# Patient Record
Sex: Female | Born: 1935 | Race: Black or African American | Hispanic: No | Smoking: Never smoker
Health system: Southern US, Community
[De-identification: ages and names within clinical notes are randomized; demographics above are authoritative.]

## PROBLEM LIST (undated history)

## (undated) DIAGNOSIS — I2510 Atherosclerotic heart disease of native coronary artery without angina pectoris: Secondary | ICD-10-CM

## (undated) DIAGNOSIS — E1140 Type 2 diabetes mellitus with diabetic neuropathy, unspecified: Secondary | ICD-10-CM

## (undated) DIAGNOSIS — J150 Pneumonia due to Klebsiella pneumoniae: Secondary | ICD-10-CM

## (undated) DIAGNOSIS — M5136 Other intervertebral disc degeneration, lumbar region: Secondary | ICD-10-CM

## (undated) DIAGNOSIS — N183 Chronic kidney disease, stage 3 unspecified: Secondary | ICD-10-CM

## (undated) DIAGNOSIS — E7800 Pure hypercholesterolemia, unspecified: Secondary | ICD-10-CM

## (undated) DIAGNOSIS — I509 Heart failure, unspecified: Secondary | ICD-10-CM

## (undated) DIAGNOSIS — I200 Unstable angina: Secondary | ICD-10-CM

## (undated) DIAGNOSIS — I361 Nonrheumatic tricuspid (valve) insufficiency: Secondary | ICD-10-CM

## (undated) DIAGNOSIS — I4891 Unspecified atrial fibrillation: Secondary | ICD-10-CM

## (undated) DIAGNOSIS — I10 Essential (primary) hypertension: Secondary | ICD-10-CM

## (undated) DIAGNOSIS — E119 Type 2 diabetes mellitus without complications: Secondary | ICD-10-CM

## (undated) DIAGNOSIS — Z9981 Dependence on supplemental oxygen: Secondary | ICD-10-CM

## (undated) DIAGNOSIS — E782 Mixed hyperlipidemia: Secondary | ICD-10-CM

## (undated) DIAGNOSIS — D649 Anemia, unspecified: Secondary | ICD-10-CM

## (undated) HISTORY — DX: Chronic kidney disease, stage 3 (moderate): N18.3

## (undated) HISTORY — DX: Atherosclerotic heart disease of native coronary artery without angina pectoris: I25.10

## (undated) HISTORY — DX: Chronic kidney disease, stage 3 unspecified: N18.30

## (undated) HISTORY — DX: Essential (primary) hypertension: I10

## (undated) HISTORY — DX: Mixed hyperlipidemia: E78.2

## (undated) HISTORY — DX: Unspecified atrial fibrillation: I48.91

## (undated) HISTORY — DX: Type 2 diabetes mellitus without complications: E11.9

---

## 2001-05-13 ENCOUNTER — Emergency Department (HOSPITAL_COMMUNITY): Admission: EM | Admit: 2001-05-13 | Discharge: 2001-05-13 | Payer: Self-pay | Admitting: *Deleted

## 2002-09-16 ENCOUNTER — Ambulatory Visit (HOSPITAL_COMMUNITY): Admission: RE | Admit: 2002-09-16 | Discharge: 2002-09-16 | Payer: Self-pay | Admitting: Pulmonary Disease

## 2002-09-20 ENCOUNTER — Ambulatory Visit (HOSPITAL_COMMUNITY): Admission: RE | Admit: 2002-09-20 | Discharge: 2002-09-20 | Payer: Self-pay | Admitting: Pulmonary Disease

## 2002-12-02 ENCOUNTER — Ambulatory Visit (HOSPITAL_COMMUNITY): Admission: RE | Admit: 2002-12-02 | Discharge: 2002-12-02 | Payer: Self-pay | Admitting: Orthopedic Surgery

## 2002-12-02 ENCOUNTER — Encounter: Payer: Self-pay | Admitting: Orthopedic Surgery

## 2002-12-19 ENCOUNTER — Encounter: Admission: RE | Admit: 2002-12-19 | Discharge: 2002-12-19 | Payer: Self-pay | Admitting: Orthopedic Surgery

## 2002-12-31 ENCOUNTER — Encounter: Payer: Self-pay | Admitting: Orthopedic Surgery

## 2003-01-14 ENCOUNTER — Inpatient Hospital Stay (HOSPITAL_COMMUNITY): Admission: RE | Admit: 2003-01-14 | Discharge: 2003-01-17 | Payer: Self-pay | Admitting: Orthopedic Surgery

## 2003-01-14 ENCOUNTER — Encounter: Payer: Self-pay | Admitting: Orthopedic Surgery

## 2003-01-14 HISTORY — PX: TOTAL HIP ARTHROPLASTY: SHX124

## 2003-05-27 ENCOUNTER — Emergency Department (HOSPITAL_COMMUNITY): Admission: EM | Admit: 2003-05-27 | Discharge: 2003-05-27 | Payer: Self-pay | Admitting: Emergency Medicine

## 2004-01-19 ENCOUNTER — Ambulatory Visit: Payer: Self-pay | Admitting: Orthopedic Surgery

## 2004-12-16 ENCOUNTER — Ambulatory Visit: Payer: Self-pay | Admitting: Orthopedic Surgery

## 2005-12-20 ENCOUNTER — Ambulatory Visit: Payer: Self-pay | Admitting: Orthopedic Surgery

## 2007-01-01 ENCOUNTER — Ambulatory Visit: Payer: Self-pay | Admitting: Orthopedic Surgery

## 2007-01-01 DIAGNOSIS — M8700 Idiopathic aseptic necrosis of unspecified bone: Secondary | ICD-10-CM | POA: Insufficient documentation

## 2007-02-05 ENCOUNTER — Ambulatory Visit (HOSPITAL_COMMUNITY): Admission: RE | Admit: 2007-02-05 | Discharge: 2007-02-05 | Payer: Self-pay | Admitting: Pulmonary Disease

## 2008-09-08 ENCOUNTER — Ambulatory Visit: Payer: Self-pay | Admitting: Cardiology

## 2008-09-08 ENCOUNTER — Ambulatory Visit (HOSPITAL_COMMUNITY): Admission: RE | Admit: 2008-09-08 | Discharge: 2008-09-08 | Payer: Self-pay | Admitting: Pulmonary Disease

## 2008-09-08 ENCOUNTER — Encounter (INDEPENDENT_AMBULATORY_CARE_PROVIDER_SITE_OTHER): Payer: Self-pay | Admitting: Pulmonary Disease

## 2008-09-09 ENCOUNTER — Ambulatory Visit: Payer: Self-pay | Admitting: Cardiology

## 2008-09-09 ENCOUNTER — Encounter: Payer: Self-pay | Admitting: Cardiology

## 2008-09-12 ENCOUNTER — Encounter: Payer: Self-pay | Admitting: Cardiology

## 2008-09-12 ENCOUNTER — Ambulatory Visit (HOSPITAL_COMMUNITY): Admission: RE | Admit: 2008-09-12 | Discharge: 2008-09-12 | Payer: Self-pay | Admitting: Cardiology

## 2008-09-12 LAB — CONVERTED CEMR LAB
BUN: 19 mg/dL (ref 6–23)
Basophils Relative: 0 % (ref 0–1)
CO2: 29 meq/L (ref 19–32)
Chloride: 102 meq/L (ref 96–112)
Eosinophils Absolute: 0.3 10*3/uL (ref 0.0–0.7)
Eosinophils Relative: 3 % (ref 0–5)
Glucose, Bld: 189 mg/dL — ABNORMAL HIGH (ref 70–99)
HCT: 41 % (ref 36.0–46.0)
Lymphs Abs: 2.1 10*3/uL (ref 0.7–4.0)
MCHC: 34 g/dL (ref 30.0–36.0)
MCV: 95 fL (ref 78.0–100.0)
Monocytes Relative: 8 % (ref 3–12)
Neutrophils Relative %: 57 % (ref 43–77)
Potassium: 4.1 meq/L (ref 3.5–5.3)
RBC: 4.31 M/uL (ref 3.87–5.11)
Sodium: 139 meq/L (ref 135–145)
WBC: 6.8 10*3/uL (ref 4.0–10.5)

## 2008-09-18 ENCOUNTER — Encounter (HOSPITAL_COMMUNITY): Admission: RE | Admit: 2008-09-18 | Discharge: 2008-10-18 | Payer: Self-pay | Admitting: Cardiology

## 2008-09-18 ENCOUNTER — Ambulatory Visit: Payer: Self-pay | Admitting: Cardiology

## 2008-09-23 ENCOUNTER — Encounter: Payer: Self-pay | Admitting: Cardiology

## 2008-09-24 ENCOUNTER — Ambulatory Visit: Payer: Self-pay | Admitting: Cardiology

## 2008-09-24 DIAGNOSIS — I10 Essential (primary) hypertension: Secondary | ICD-10-CM | POA: Insufficient documentation

## 2008-09-24 DIAGNOSIS — R0609 Other forms of dyspnea: Secondary | ICD-10-CM | POA: Insufficient documentation

## 2008-09-24 DIAGNOSIS — I4819 Other persistent atrial fibrillation: Secondary | ICD-10-CM | POA: Insufficient documentation

## 2008-09-24 DIAGNOSIS — R0989 Other specified symptoms and signs involving the circulatory and respiratory systems: Secondary | ICD-10-CM

## 2008-09-29 ENCOUNTER — Ambulatory Visit (HOSPITAL_COMMUNITY): Admission: RE | Admit: 2008-09-29 | Discharge: 2008-09-29 | Payer: Self-pay | Admitting: Cardiology

## 2008-09-29 ENCOUNTER — Ambulatory Visit: Payer: Self-pay | Admitting: Cardiology

## 2008-09-29 ENCOUNTER — Encounter: Payer: Self-pay | Admitting: Cardiology

## 2008-10-02 ENCOUNTER — Ambulatory Visit: Payer: Self-pay | Admitting: Cardiology

## 2008-10-06 ENCOUNTER — Ambulatory Visit: Payer: Self-pay | Admitting: Cardiology

## 2008-10-06 LAB — CONVERTED CEMR LAB: POC INR: 2.1

## 2008-10-15 ENCOUNTER — Ambulatory Visit: Payer: Self-pay

## 2008-10-15 LAB — CONVERTED CEMR LAB: POC INR: 2.1

## 2008-10-23 ENCOUNTER — Ambulatory Visit: Payer: Self-pay | Admitting: Cardiology

## 2008-10-23 LAB — CONVERTED CEMR LAB: POC INR: 1.8

## 2008-10-28 ENCOUNTER — Encounter: Payer: Self-pay | Admitting: Cardiology

## 2008-11-03 ENCOUNTER — Ambulatory Visit: Payer: Self-pay | Admitting: Cardiology

## 2008-11-03 LAB — CONVERTED CEMR LAB: POC INR: 1.7

## 2008-11-10 ENCOUNTER — Ambulatory Visit: Payer: Self-pay | Admitting: Cardiology

## 2008-11-10 LAB — CONVERTED CEMR LAB: POC INR: 2.1

## 2008-11-24 ENCOUNTER — Ambulatory Visit: Payer: Self-pay | Admitting: Cardiology

## 2008-11-24 LAB — CONVERTED CEMR LAB: POC INR: 1.8

## 2008-12-10 ENCOUNTER — Ambulatory Visit: Payer: Self-pay | Admitting: Orthopedic Surgery

## 2008-12-10 DIAGNOSIS — Z96649 Presence of unspecified artificial hip joint: Secondary | ICD-10-CM

## 2008-12-15 ENCOUNTER — Ambulatory Visit: Payer: Self-pay | Admitting: Cardiology

## 2008-12-18 ENCOUNTER — Ambulatory Visit: Payer: Self-pay | Admitting: Cardiology

## 2008-12-22 ENCOUNTER — Ambulatory Visit: Payer: Self-pay | Admitting: Cardiology

## 2008-12-22 LAB — CONVERTED CEMR LAB: POC INR: 2.7

## 2008-12-31 ENCOUNTER — Ambulatory Visit: Payer: Self-pay | Admitting: Cardiology

## 2009-01-14 ENCOUNTER — Ambulatory Visit: Payer: Self-pay | Admitting: Cardiovascular Disease

## 2009-01-14 ENCOUNTER — Encounter (INDEPENDENT_AMBULATORY_CARE_PROVIDER_SITE_OTHER): Payer: Self-pay | Admitting: *Deleted

## 2009-01-14 LAB — CONVERTED CEMR LAB
ALT: 10 units/L
Alkaline Phosphatase: 109 units/L
Bilirubin, Direct: 0.2 mg/dL
CO2: 20 meq/L
Cholesterol: 177 mg/dL
Creatinine, Ser: 1.11 mg/dL
HDL: 60 mg/dL
Hgb A1c MFr Bld: 6.7 %
LDL Cholesterol: 105 mg/dL
Triglycerides: 60 mg/dL

## 2009-01-28 ENCOUNTER — Encounter (INDEPENDENT_AMBULATORY_CARE_PROVIDER_SITE_OTHER): Payer: Self-pay | Admitting: *Deleted

## 2009-01-28 ENCOUNTER — Ambulatory Visit: Payer: Self-pay | Admitting: Cardiology

## 2009-01-28 LAB — CONVERTED CEMR LAB: POC INR: 1.7

## 2009-02-10 ENCOUNTER — Ambulatory Visit: Payer: Self-pay | Admitting: Cardiology

## 2009-02-10 ENCOUNTER — Encounter: Payer: Self-pay | Admitting: *Deleted

## 2009-02-11 ENCOUNTER — Ambulatory Visit: Payer: Self-pay | Admitting: Cardiology

## 2009-02-19 ENCOUNTER — Ambulatory Visit: Payer: Self-pay | Admitting: Cardiovascular Disease

## 2009-03-02 ENCOUNTER — Ambulatory Visit: Payer: Self-pay | Admitting: Cardiology

## 2009-03-02 LAB — CONVERTED CEMR LAB: POC INR: 5.6

## 2009-03-05 ENCOUNTER — Ambulatory Visit: Payer: Self-pay | Admitting: Cardiovascular Disease

## 2009-03-05 LAB — CONVERTED CEMR LAB: POC INR: 2.9

## 2009-03-11 ENCOUNTER — Ambulatory Visit: Payer: Self-pay | Admitting: Cardiology

## 2009-03-11 LAB — CONVERTED CEMR LAB: POC INR: 3.8

## 2009-03-25 ENCOUNTER — Ambulatory Visit: Payer: Self-pay | Admitting: Cardiovascular Disease

## 2009-04-01 ENCOUNTER — Ambulatory Visit: Payer: Self-pay | Admitting: Cardiology

## 2009-04-01 LAB — CONVERTED CEMR LAB: POC INR: 2.6

## 2009-04-15 ENCOUNTER — Ambulatory Visit: Payer: Self-pay | Admitting: Cardiology

## 2009-04-22 ENCOUNTER — Ambulatory Visit: Payer: Self-pay | Admitting: Cardiology

## 2009-05-06 ENCOUNTER — Ambulatory Visit: Payer: Self-pay | Admitting: Cardiology

## 2009-05-27 ENCOUNTER — Ambulatory Visit: Payer: Self-pay | Admitting: Cardiology

## 2009-06-03 ENCOUNTER — Ambulatory Visit: Payer: Self-pay | Admitting: Cardiology

## 2009-06-03 LAB — CONVERTED CEMR LAB: POC INR: 2.9

## 2009-06-17 ENCOUNTER — Ambulatory Visit: Payer: Self-pay | Admitting: Cardiology

## 2009-06-17 LAB — CONVERTED CEMR LAB: POC INR: 4

## 2009-06-25 ENCOUNTER — Ambulatory Visit: Payer: Self-pay | Admitting: Cardiology

## 2009-07-08 ENCOUNTER — Ambulatory Visit: Payer: Self-pay | Admitting: Cardiology

## 2009-07-08 LAB — CONVERTED CEMR LAB: POC INR: 3.4

## 2009-07-23 ENCOUNTER — Ambulatory Visit: Payer: Self-pay | Admitting: Cardiology

## 2009-07-23 LAB — CONVERTED CEMR LAB: POC INR: 3.1

## 2009-08-13 ENCOUNTER — Ambulatory Visit: Payer: Self-pay | Admitting: Cardiology

## 2009-08-27 ENCOUNTER — Ambulatory Visit: Payer: Self-pay | Admitting: Cardiology

## 2009-09-17 ENCOUNTER — Ambulatory Visit: Payer: Self-pay | Admitting: Cardiology

## 2009-09-17 LAB — CONVERTED CEMR LAB: POC INR: 3.1

## 2009-09-30 ENCOUNTER — Ambulatory Visit: Payer: Self-pay | Admitting: Cardiology

## 2009-09-30 DIAGNOSIS — E782 Mixed hyperlipidemia: Secondary | ICD-10-CM | POA: Insufficient documentation

## 2009-10-07 ENCOUNTER — Ambulatory Visit: Payer: Self-pay | Admitting: Cardiology

## 2009-10-28 ENCOUNTER — Ambulatory Visit: Payer: Self-pay | Admitting: Cardiology

## 2009-11-18 ENCOUNTER — Ambulatory Visit: Payer: Self-pay | Admitting: Cardiology

## 2009-12-09 ENCOUNTER — Ambulatory Visit: Payer: Self-pay | Admitting: Cardiology

## 2009-12-09 LAB — CONVERTED CEMR LAB: POC INR: 2.1

## 2010-01-06 ENCOUNTER — Ambulatory Visit: Payer: Self-pay | Admitting: Cardiology

## 2010-01-06 LAB — CONVERTED CEMR LAB: POC INR: 2.1

## 2010-02-03 ENCOUNTER — Ambulatory Visit: Payer: Self-pay | Admitting: Cardiology

## 2010-03-03 ENCOUNTER — Ambulatory Visit: Admission: RE | Admit: 2010-03-03 | Discharge: 2010-03-03 | Payer: Self-pay | Source: Home / Self Care

## 2010-03-03 LAB — CONVERTED CEMR LAB: POC INR: 2.6

## 2010-03-09 NOTE — Medication Information (Signed)
Summary: ccr-lr  Anticoagulant Therapy  Managed by: Vashti Hey, RN PCP: Dr. Kari Baars Supervising MD: Dietrich Pates MD, Molly Maduro Indication 1: Atrial Fibrillation Lab Used: [facility] [place] Site: [place] INR POC 3.7 INR RANGE 2-3  Dietary changes: no    Health status changes: no    Bleeding/hemorrhagic complications: no    Recent/future hospitalizations: no    Any changes in medication regimen? no    Recent/future dental: no  Any missed doses?: no       Is patient compliant with meds? yes       Allergies: No Known Drug Allergies  Anticoagulation Management History:      The patient is taking warfarin and comes in today for a routine follow up visit.  Positive risk factors for bleeding include an age of 75 years or older.  The bleeding index is 'intermediate risk'.  Positive CHADS2 values include History of HTN and Age > 50 years old.  Her last INR was 0.9.  Anticoagulation responsible provider: Dietrich Pates MD, Molly Maduro.  INR POC: 3.7.  Cuvette Lot#: [id number].  Exp: 10/11.    Anticoagulation Management Assessment/Plan:      The patient's current anticoagulation dose is Warfarin sodium 5 mg tabs: Take as directed by [facility].  The target INR is 2.0-3.0.  The next INR is due 10/28/2009.  Anticoagulation instructions were given to patient.  Results were reviewed/authorized by Vashti Hey, RN.  She was notified by Vashti Hey RN.         Prior Anticoagulation Instructions: INR 3.1 Take coumadin 1 tablet tonight then resume 1 tablet once daily except 2 tablets on Sundays, Tuesdays and Thursdays  Current Anticoagulation Instructions: INR 3.7 Hold coumadin tonight then decrease dose to 5mg  once daily except 10mg  on Sundays and Thursdays

## 2010-03-09 NOTE — Medication Information (Signed)
Summary: ccr-lr  Anticoagulant Therapy  Managed by: Melissa Hey, Melissa [name] PCP: Dr. Kari Baars Supervising MD: Diona Browner MD, Remi Deter Indication 1: Atrial Fibrillation Lab Used: [facility] [place] Site: [place] INR POC 4.9 INR RANGE 2-3  Dietary changes: no    Health status changes: no    Bleeding/hemorrhagic complications: no    Recent/future hospitalizations: no    Any changes in medication regimen? no    Recent/future dental: no  Any missed doses?: no       Is patient compliant with meds? yes       Allergies: No Known Drug Allergies  Anticoagulation Management History:      The patient is taking warfarin and comes in today for a routine follow up visit.  Positive risk factors for bleeding include an age of 75 years or older.  The bleeding index is 'intermediate risk'.  Positive CHADS2 values include History of HTN and Age > 54 years old.  Her last INR was 0.9.  Anticoagulation responsible provider: Diona Browner MD, Remi Deter.  INR POC: 4.9.  Cuvette Lot#: [id number].  Exp: 10/11.    Anticoagulation Management Assessment/Plan:      The patient's current anticoagulation dose is Warfarin sodium 5 mg tabs: Take as directed by [facility].  The target INR is 2.0-3.0.  The next INR is due 08/27/2009.  Anticoagulation instructions were given to patient.  Results were reviewed/authorized by Melissa Hey, Melissa [name].         Prior Anticoagulation Instructions: INR 3.1 Take coumdin 1 tablet tonight then resume 2 tablets qd except 1 tablets on Mondays, Wednesdays and Fridays  Current Anticoagulation Instructions: INR 4.9 Hold coumadin tonight and tomorrow night then decrease dose to 5mg  once daily except 10 on Sundays, Tuesdays and Thursdays

## 2010-03-09 NOTE — Medication Information (Signed)
Summary: ccr  Anticoagulant Therapy  Managed by: Vashti Hey, RN PCP: Dr. Kari Baars Supervising MD: Diona Browner MD, Remi Deter Indication 1: Atrial Fibrillation Lab Used: [facility] [place] Site: [place] INR POC 4.0 INR RANGE 2-3  Dietary changes: no    Health status changes: no    Bleeding/hemorrhagic complications: no    Recent/future hospitalizations: no    Any changes in medication regimen? no    Recent/future dental: no  Any missed doses?: no       Is patient compliant with meds? yes       Allergies: No Known Drug Allergies  Anticoagulation Management History:      The patient is taking warfarin and comes in today for a routine follow up visit.  Positive risk factors for bleeding include an age of 75 years or older.  The bleeding index is 'intermediate risk'.  Positive CHADS2 values include History of HTN and Age > 69 years old.  Her last INR was 0.9.  Anticoagulation responsible provider: Diona Browner MD, Remi Deter.  INR POC: 4.0.  Cuvette Lot#: [id number].  Exp: 10/11.    Anticoagulation Management Assessment/Plan:      The patient's current anticoagulation dose is Warfarin sodium 5 mg tabs: Take as directed by [facility].  The target INR is 2.0-3.0.  The next INR is due 06/25/2009.  Anticoagulation instructions were given to patient.  Results were reviewed/authorized by Vashti Hey, RN.  She was notified by Vashti Hey RN.         Prior Anticoagulation Instructions: INR 2.9 Continue coumadin 2 tablets once daily  Pt will be out of town from 5/4 - 5/9  Current Anticoagulation Instructions: INR 4.0 Hold coumadin tonight then decrease dose to 10mg  once daily except 5mg  on Mondays and Thursdays

## 2010-03-09 NOTE — Medication Information (Signed)
Summary: ccr-lr  Anticoagulant Therapy  Managed by: Vashti Hey, RN PCP: Dr. Kari Baars Supervising MD: Dietrich Pates MD, Molly Maduro Indication 1: Atrial Fibrillation Lab Used: [facility] [place] Site: [place] INR POC 3.8 INR RANGE 2-3  Dietary changes: no    Health status changes: no    Bleeding/hemorrhagic complications: no    Recent/future hospitalizations: no    Any changes in medication regimen? no    Recent/future dental: no  Any missed doses?: no       Is patient compliant with meds? yes       Allergies: No Known Drug Allergies  Anticoagulation Management History:      The patient is taking warfarin and comes in today for a routine follow up visit.  Positive risk factors for bleeding include an age of 75 years or older.  The bleeding index is 'intermediate risk'.  Positive CHADS2 values include History of HTN and Age > 75 years old.  Her last INR was 0.9.  Anticoagulation responsible provider: Dietrich Pates MD, Molly Maduro.  INR POC: 3.8.  Cuvette Lot#: [id number].  Exp: 10/11.    Anticoagulation Management Assessment/Plan:      The patient's current anticoagulation dose is Warfarin sodium 5 mg tabs: Take as directed by [facility].  The target INR is 2.0-3.0.  The next INR is due 03/25/2009.  Anticoagulation instructions were given to patient.  Results were reviewed/authorized by Vashti Hey, RN.  She was notified by Vashti Hey RN.         Prior Anticoagulation Instructions: INR 2.9 Will finish Keflex on Monday 03/09/09 Take coumadin 1 1/2 tablets x 3 nights then resume 2 tablets once daily except 1 1/2 tablets on Mondays, Wednesdays and Fridays  Current Anticoagulation Instructions: INR 3.8 Take coumadin 1/2 tablet tonight then decrease dose to 1 1/2 tablets once daily except 2 tablets on Sundays, Tuesdays and Thursdays

## 2010-03-09 NOTE — Medication Information (Signed)
Summary: ccr-lr  Anticoagulant Therapy  Managed by: Vashti Hey, RN PCP: Dr. Kari Baars Supervising MD: Dietrich Pates MD, Molly Maduro Indication 1: Atrial Fibrillation Lab [facility]  Site:  INR POC 2.9 INR RANGE 2-3  Dietary changes: no    Health status changes: no    Bleeding/hemorrhagic complications: no    Recent/future hospitalizations: no    Any changes in medication regimen? no    Recent/future dental: no  Any missed doses?: no       Is patient compliant with meds? yes       Allergies: No Known Drug Allergies  Anticoagulation Management History:      The patient is taking warfarin and comes in today for a routine follow up visit.  Positive risk factors for bleeding include an age of 75 years or older.  The bleeding index is 'intermediate risk'.  Positive CHADS2 values include History of HTN and Age > 75 years old.  Her last INR was 0.9.  Anticoagulation responsible provider: Dietrich Pates MD, Molly Maduro.  INR POC: 2.9.  Cuvette Lot#: [id number].  Exp: 10/11.    Anticoagulation Management Assessment/Plan:      The patient's current anticoagulation dose is Warfarin sodium 5 mg tabs: Take as directed by [facility].  The target INR is 2.0-3.0.  The next INR is due 06/17/2009.  Anticoagulation instructions were given to patient.  Results were reviewed/authorized by Vashti Hey, RN.  She was notified by Vashti Hey RN.         Prior Anticoagulation Instructions: INR 1.1 Take coumadin 2 tablets once daily then recheck 06/03/09   Current Anticoagulation Instructions: INR 2.9 Continue coumadin 2 tablets once daily  Pt will be out of town from 5/4 - 5/9

## 2010-03-09 NOTE — Medication Information (Signed)
Summary: ccr-lr  Anticoagulant Therapy  Managed by: Melissa Hey, RN PCP: Dr. Kari Baars Supervising MD: Dietrich Pates MD, Molly Maduro Indication 1: Atrial Fibrillation Lab Used: [facility] [place] Site: [place] INR POC 2.1 INR RANGE 2-3  Dietary changes: no    Health status changes: no    Bleeding/hemorrhagic complications: no    Recent/future hospitalizations: no    Any changes in medication regimen? no    Recent/future dental: no  Any missed doses?: no       Is patient compliant with meds? yes       Allergies: No Known Drug Allergies  Anticoagulation Management History:      The patient is taking warfarin and comes in today for a routine follow up visit.  Positive risk factors for bleeding include an age of 75 years or older.  The bleeding index is 'intermediate risk'.  Positive CHADS2 values include History of HTN and Age > 42 years old.  Her last INR was 0.9.  Anticoagulation responsible provider: Dietrich Pates MD, Molly Maduro.  INR POC: 2.1.  Cuvette Lot#: [id number].  Exp: 10/11.    Anticoagulation Management Assessment/Plan:      The patient's current anticoagulation dose is Warfarin sodium 5 mg tabs: Take as directed by [facility].  The target INR is 2.0-3.0.  The next INR is due 12/09/2009.  Anticoagulation instructions were given to patient.  Results were reviewed/authorized by Melissa Hey, RN.  She was notified by Melissa Hey RN.         Prior Anticoagulation Instructions: INR 2.3 Continue coumadin 5mg  once daily except 10mg  on Sundays and Thursdays  Current Anticoagulation Instructions: INR 2.1 Continue coumadin 5mg  once daily except 10mg  on Sundays and Thursdays

## 2010-03-09 NOTE — Medication Information (Signed)
Summary: ccr-lr  Anticoagulant Therapy  Managed by: Vashti Hey, RN PCP: Dr. Kari Baars Supervising MD: Dietrich Pates MD, Molly Maduro Indication 1: Atrial Fibrillation Lab Used: [facility] [place] Site: [place] INR POC 3.5 INR RANGE 2-3  Dietary changes: no    Health status changes: no    Bleeding/hemorrhagic complications: no    Recent/future hospitalizations: no    Any changes in medication regimen? no    Recent/future dental: no  Any missed doses?: no       Is patient compliant with meds? yes       Allergies: No Known Drug Allergies  Anticoagulation Management History:      The patient is taking warfarin and comes in today for a routine follow up visit.  Positive risk factors for bleeding include an age of 75 years or older.  The bleeding index is 'intermediate risk'.  Positive CHADS2 values include History of HTN and Age > 75 years old.  Her last INR was 0.9.  Anticoagulation responsible provider: Dietrich Pates MD, Molly Maduro.  INR POC: 3.5.  Cuvette Lot#: [id number].  Exp: 10/11.    Anticoagulation Management Assessment/Plan:      The patient's current anticoagulation dose is Warfarin sodium 5 mg tabs: Take as directed by [facility].  The target INR is 2.0-3.0.  The next INR is due 07/09/2009.  Anticoagulation instructions were given to patient.  Results were reviewed/authorized by Vashti Hey, RN.  She was notified by Vashti Hey RN.         Prior Anticoagulation Instructions: INR 4.0 Hold coumadin tonight then decrease dose to 10mg  once daily except 5mg  on Mondays and Thursdays  Current Anticoagulation Instructions: INR 3.5 Hold coumadin tonight then resume 10mg  once daily except 5mg  on Mondays and Thursdays

## 2010-03-09 NOTE — Medication Information (Signed)
Summary: ccr-lr  Anticoagulant Therapy  Managed by: Vashti Hey, RN PCP: Dr. Kari Baars Supervising MD: Daleen Squibb MD, Darene Lamer Used: [facility] [place] Site: [place] INR POC 1.6 INR RANGE 2-3  Dietary changes: no    Health status changes: no    Bleeding/hemorrhagic complications: no    Recent/future hospitalizations: no    Any changes in medication regimen? no    Recent/future dental: no  Any missed doses?: no       Is patient compliant with meds? yes       Allergies: No Known Drug Allergies  Anticoagulation Management History:      The patient is taking warfarin and comes in today for a routine follow up visit.  Positive risk factors for bleeding include an age of 75 years or older.  The bleeding index is 'intermediate risk'.  Positive CHADS2 values include History of HTN and Age > 70 years old.  Her last INR was 0.9.  Anticoagulation responsible provider: Daleen Squibb MD, Maisie Fus.  INR POC: 1.6.  Cuvette Lot#: [id number].  Exp: 10/11.    Anticoagulation Management Assessment/Plan:      The patient's current anticoagulation dose is Warfarin sodium 5 mg tabs: Take as directed by [facility].  The target INR is 2.0-3.0.  The next INR is due 02/19/2009.  Anticoagulation instructions were given to patient.  Results were reviewed/authorized by Vashti Hey, RN.  She was notified by Vashti Hey RN.         Prior Anticoagulation Instructions: INR 1.7  Increase coumadin to 10mg  once daily except 7.5mg  on Mondays and Fridays  Current Anticoagulation Instructions: INR 1.6 Take coumadin 3mg  x 2 days then increase dose to 10mg  once daily

## 2010-03-09 NOTE — Medication Information (Signed)
Summary: CCR  Anticoagulant Therapy  Managed by: Melissa Hey, RN PCP: Melissa [name] Supervising MD: Melissa Squibb MD, Melissa [name] Indication 1: Atrial Fibrillation Lab Used: [facility] [place] Site: [place] INR POC 3.1 INR RANGE 2-3  Dietary changes: no    Health status changes: no    Bleeding/hemorrhagic complications: no    Recent/future hospitalizations: no    Any changes in medication regimen? no    Recent/future dental: no  Any missed doses?: no       Is patient compliant with meds? yes       Allergies: No Known Drug Allergies  Anticoagulation Management History:      The patient is taking warfarin and comes in today for a routine follow up visit.  Positive risk factors for bleeding include an age of 75 years or older.  The bleeding index is 'intermediate risk'.  Positive CHADS2 values include History of HTN and Age > 75 years old.  Her last INR was 0.9.  Anticoagulation responsible provider: Daleen Squibb MD, Melissa [name].  INR POC: 3.1.  Cuvette Lot#: [id number].  Exp: 10/11.    Anticoagulation Management Assessment/Plan:      The patient's current anticoagulation dose is Warfarin sodium 5 mg tabs: Take as directed by [facility].  The target INR is 2.0-3.0.  The next INR is due 05/06/2009.  Anticoagulation instructions were given to patient.  Results were reviewed/authorized by Melissa Hey, RN.  She was notified by Melissa Hey RN.         Prior Anticoagulation Instructions: INR 1.6 Take coumadin 3 tablets tonight and tomorrow night then resume 2 tablets once daily except 1 1/2 tablets on Sundays, Tuesdays and Thursdays  Current Anticoagulation Instructions: INR 3.1 Continue coumadin 10mg  once daily except 7.5mg  on Sundays, Tuesdays and Thursdays

## 2010-03-09 NOTE — Medication Information (Signed)
Summary: ccr-lr  Anticoagulant Therapy  Managed by: Vashti Hey, RN PCP: Dr. Kari Baars Supervising MD: Eden Emms MD, Theron Arista Indication 1: Atrial Fibrillation Lab Used: [facility] [place] Site: [place] INR POC 1.5 INR RANGE 2-3  Dietary changes: no    Health status changes: no    Bleeding/hemorrhagic complications: no    Recent/future hospitalizations: no    Any changes in medication regimen? no    Recent/future dental: no  Any missed doses?: no       Is patient compliant with meds? yes       Allergies: No Known Drug Allergies  Anticoagulation Management History:      The patient is taking warfarin and comes in today for a routine follow up visit.  Positive risk factors for bleeding include an age of 75 years or older.  The bleeding index is 'intermediate risk'.  Positive CHADS2 values include History of HTN and Age > 59 years old.  Her last INR was 0.9.  Anticoagulation responsible provider: Eden Emms MD, Theron Arista.  INR POC: 1.5.  Cuvette Lot#: [id number].  Exp: 10/11.    Anticoagulation Management Assessment/Plan:      The patient's current anticoagulation dose is Warfarin sodium 5 mg tabs: Take as directed by [facility].  The target INR is 2.0-3.0.  The next INR is due 04/01/2009.  Anticoagulation instructions were given to patient.  Results were reviewed/authorized by Vashti Hey, RN.  She was notified by Vashti Hey RN.         Prior Anticoagulation Instructions: INR 3.8 Take coumadin 1/2 tablet tonight then decrease dose to 1 1/2 tablets once daily except 2 tablets on Sundays, Tuesdays and Thursdays  Current Anticoagulation Instructions: INR 1.5 Take coumadin 2 1/2 tablets tonight then increase dose to 2 tablets once daily except 1 1/2 tablets on Sundays, Tuesdays and Thursdays

## 2010-03-09 NOTE — Medication Information (Signed)
Summary: ccr-lr  Anticoagulant Therapy  Managed by: Vashti Hey, RN PCP: Dr. Kari Baars Supervising MD: Eden Emms MD, Rometta Emery Used[facility] [place] Site: [place] INR POC 3.8 INR RANGE 2-3  Dietary changes: no    Health status changes: no    Bleeding/hemorrhagic complications: no    Recent/future hospitalizations: no    Any changes in medication regimen? no    Recent/future dental: no  Any missed doses?: no       Is patient compliant with meds? yes       Allergies: No Known Drug Allergies  Anticoagulation Management History:      The patient is taking warfarin and comes in today for a routine follow up visit.  Positive risk factors for bleeding include an age of 76 years or older.  The bleeding index is 'intermediate risk'.  Positive CHADS2 values include History of HTN and Age > 88 years old.  Her last INR was 0.9.  Anticoagulation responsible provider: Eden Emms MD, Theron Arista.  INR POC: 3.8.  Cuvette Lot#: [id number].  Exp: 10/11.    Anticoagulation Management Assessment/Plan:      The patient's current anticoagulation dose is Warfarin sodium 5 mg tabs: Take as directed by [facility].  The target INR is 2.0-3.0.  The next INR is due 03/02/2009.  Anticoagulation instructions were given to patient.  Results were reviewed/authorized by Vashti Hey, RN.  She was notified by Vashti Hey RN.         Prior Anticoagulation Instructions: INR 1.6 Take coumadin 3mg  x 2 days then increase dose to 10mg  once daily   Current Anticoagulation Instructions: INR 3.8 Do not take coumadin tonight then decrease dose to 2 tablets once daily except 1 1/2 tablets on Mondays

## 2010-03-09 NOTE — Assessment & Plan Note (Signed)
Summary: 3 MTH F/U PER CHECKOUT ON 11/03/08/TG   Visit Type:  Follow-up Primary Provider:  Dr. Kari Baars   History of Present Illness: 77 year old woman returns for followup. She states that she feels better in general since we made medication adjustments at her last visit. Her blood pressure is better controlled. She denies any sense of palpitations or chest pain. She recently celebrated her [birth year] on [birth year].  She has had no bleeding problems on Coumadin.  Clinical Review Panels:  Lipid Levels LDL 105 (01/14/2009) HDL 60 (01/14/2009) Cholesterol 177 (01/14/2009)  Echocardiogram Echocardiogram   Study Conclusions    1. Left ventricle: The cavity size was normal. Wall thickness was      increased in a pattern of mild to moderate LVH. Systolic function      was normal. The estimated ejection fraction was in the range of      55% to 60%. Wall motion was normal; there were no regional wall      motion abnormalities.   2. Aortic valve: Mildly calcified annulus. Trileaflet.   3. Mitral valve: Mild regurgitation.   4. Left atrium: The atrium was moderately dilated.   5. Right atrium: The atrium was moderately dilated.   6. Tricuspid valve: Moderate regurgitation.   7. Pulmonary arteries: Systolic pressure was moderately increased.      PA peak pressure: 54mm Hg (S).   8. Pericardium, extracardiac: A small pericardial effusion was      identified.   Echocardiography. M-mode, complete 2D, spectral Doppler, and color   Doppler. Patient status: Outpatient. Location: Echo laboratory. (09/08/2009)    Current Medications (verified): 1)  Diabetic Medicine 2)  Glimepiride 2 Mg Tabs (Glimepiride) .... Take 1 Tab Daily 3)  Lipitor 20 Mg Tabs (Atorvastatin Calcium) .... Take 1 Tab Daily 4)  Metformin Hcl 500 Mg Tabs (Metformin Hcl) .... Take 1 Tab Two Times A Day 5)  Lisinopril 40 Mg Tabs (Lisinopril) .... Take 1 Tab Two Times A Day 6)  Norvasc 10 Mg Tabs (Amlodipine Besylate)  .... Take 1 Tablet By Mouth Once Daily 7)  Warfarin Sodium 5 Mg Tabs (Warfarin Sodium) .... Take As Directed By Coumadin Clinic 8)  Hydrocodone-Acetaminophen 5-500 Mg Tabs (Hydrocodone-Acetaminophen) .... Take As Needed For Pain 9)  Coreg 6.25 Mg Tabs (Carvedilol) .... Take 1 Tablet By Mouth Two Times A Day  Allergies (verified): No Known Drug Allergies  Past History:  Past Medical History: Last updated: 11/03/2008 Hypertension Atrial Fibrillation Diabetes Type 2 Hyperlipidemia  Social History: Last updated: 09/19/2008 Retired  Tobacco Use - No.  Alcohol Use - no Regular Exercise - no Drug Use - no  Review of Systems       The patient complains of decreased hearing.  The patient denies anorexia, fever, chest pain, syncope, peripheral edema, hemoptysis, melena, and hematochezia.         Otherwise reviewed and negative.  Vital Signs:  Patient profile:   77 year old female Weight:      185 pounds BMI:     29.08 Pulse rate:   82 / minute BP sitting:   150 / 92  (right arm)  Vitals Entered By: Dreama Saa, CNA (February 10, 2009 2:47 PM)  Physical Exam  Additional Exam:  Overweight woman in no acute distress. HEENT: Conjunctiva and lids normal, oropharynx with moist mucosa. Neck: Supple, no carotid bruits or thyromegaly. Lungs: Clear with diminished breath sounds, nonlabored. Cardiac: Irregularly irregular, no S3. Abdomen: Soft, nontender, bowel sounds present. Extremities: No  pitting edema. Distal pulses one plus. Skin: Warm and dry. Musculoskeletal: No gross deformities. Neuropsychiatric: Alert oriented x3, hard of hearing.   Impression & Recommendations:  Problem # 1:  ATRIAL FIBRILLATION (ICD-427.31)  Persistent atrial fibrillation, managed with strategy of heart rate control and anti-coagulation was a CHADS2 score of 3. She has tolerated the addition of Coreg. She will continue to followup in the Coumadin clinic, and I will see her back in 6 months.  The  following medications were removed from the medication list:    Adult Aspirin Ec Low Strength 81 Mg Tbec (Aspirin) Her updated medication list for this problem includes:    Warfarin Sodium 5 Mg Tabs (Warfarin sodium) .Marland Kitchen... Take as directed by coumadin clinic    Coreg 6.25 Mg Tabs (Carvedilol) .Marland Kitchen... Take 1 tablet by mouth two times a day  Problem # 2:  ESSENTIAL HYPERTENSION, BENIGN (ICD-401.1)  Blood pressure is better controlled today in comparison to last visit, although not clearly at optimal range. She will continue her present medical regimen and followup with Dr. Juanetta Gosling.  The following medications were removed from the medication list:    Adult Aspirin Ec Low Strength 81 Mg Tbec (Aspirin) Her updated medication list for this problem includes:    Lisinopril 40 Mg Tabs (Lisinopril) .Marland Kitchen... Take 1 tab two times a day    Norvasc 10 Mg Tabs (Amlodipine besylate) .Marland Kitchen... Take 1 tablet by mouth once daily    Coreg 6.25 Mg Tabs (Carvedilol) .Marland Kitchen... Take 1 tablet by mouth two times a day  Patient Instructions: 1)  Your physician recommends that you schedule a follow-up appointment in: 6 months 2)  Your physician recommends that you continue on your current medications as directed. Please refer to the Current Medication list given to you today. Prescriptions: WARFARIN SODIUM 5 MG TABS (WARFARIN SODIUM) Take as directed by Coumadin Clinic  #60 x 3   Entered by:   Larita Fife Via LPN   Authorized by:   Loreli Slot, MD, [facility]   Signed by:   Larita Fife Via LPN on 56/21/3086   Method used:   Electronically to        [facility]* (retail)       [address] [place], [place]  [address]       Ph: [phone]       Fax: [phone]   RxID:   [id number]

## 2010-03-09 NOTE — Medication Information (Signed)
Summary: ccr-lr  Anticoagulant Therapy  Managed by: Vashti Hey, RN PCP: Dr. Kari Baars Supervising MD: Diona Browner MD, Remi Deter Indication 1: Atrial Fibrillation Lab Used: [facility] [place] Site: [place] INR POC 2.3 INR RANGE 2-3  Dietary changes: no    Health status changes: no    Bleeding/hemorrhagic complications: no    Recent/future hospitalizations: no    Any changes in medication regimen? no    Recent/future dental: no  Any missed doses?: no       Is patient compliant with meds? yes       Allergies: No Known Drug Allergies  Anticoagulation Management History:      The patient is taking warfarin and comes in today for a routine follow up visit.  Positive risk factors for bleeding include an age of 75 years or older.  The bleeding index is 'intermediate risk'.  Positive CHADS2 values include History of HTN and Age > 61 years old.  Her last INR was 0.9.  Anticoagulation responsible Milik Gilreath: Diona Browner MD, Remi Deter.  INR POC: 2.3.  Cuvette Lot#: [id number].  Exp: 10/11.    Anticoagulation Management Assessment/Plan:      The patient's current anticoagulation dose is Warfarin sodium 5 mg tabs: Take as directed by Coumadin Clinic.  The target INR is 2.0-3.0.  The next INR is due 11/18/2009.  Anticoagulation instructions were given to patient.  Results were reviewed/authorized by Vashti Hey, RN.  She was notified by Vashti Hey RN.         Prior Anticoagulation Instructions: INR 3.7 Hold coumadin tonight then decrease dose to 5mg  once daily except 10mg  on Sundays and Thursdays  Current Anticoagulation Instructions: INR 2.3 Continue coumadin 5mg  once daily except 10mg  on Sundays and Thursdays

## 2010-03-09 NOTE — Medication Information (Signed)
Summary: ccr-lr  Anticoagulant Therapy  Managed by: Vashti Hey, RN PCP: Dr. Kari Baars Supervising MD: Dietrich Pates MD, Molly Maduro Indication 1: Atrial Fibrillation Lab Used: [facility] [place] Site: [place] INR POC 3.1 INR RANGE 2-3  Dietary changes: no    Health status changes: no    Bleeding/hemorrhagic complications: no    Recent/future hospitalizations: no    Any changes in medication regimen? no    Recent/future dental: no  Any missed doses?: no       Is patient compliant with meds? yes       Allergies: No Known Drug Allergies  Anticoagulation Management History:      The patient is taking warfarin and comes in today for a routine follow up visit.  Positive risk factors for bleeding include an age of 75 years or older.  The bleeding index is 'intermediate risk'.  Positive CHADS2 values include History of HTN and Age > 75 years old.  Her last INR was 0.9.  Anticoagulation responsible provider: Dietrich Pates MD, Molly Maduro.  INR POC: 3.1.  Cuvette Lot#: [id number].  Exp: 10/11.    Anticoagulation Management Assessment/Plan:      The patient's current anticoagulation dose is Warfarin sodium 5 mg tabs: Take as directed by Coumadin Clinic.  The target INR is 2.0-3.0.  The next INR is due 10/07/2009.  Anticoagulation instructions were given to patient.  Results were reviewed/authorized by Vashti Hey, RN.  She was notified by Vashti Hey RN.         Prior Anticoagulation Instructions: INR 2.8 Continue coumadin 5mg  once daily except 10mg  on Sundays, Tuesdays and Thursdays  Current Anticoagulation Instructions: INR 3.1 Take coumadin 1 tablet tonight then resume 1 tablet once daily except 2 tablets on Sundays, Tuesdays and Thursdays

## 2010-03-09 NOTE — Medication Information (Signed)
Summary: ccr-lr  Anticoagulant Therapy  Managed by: Vashti Hey, RN PCP: Dr. Kari Baars Supervising MD: Diona Browner MD, Remi Deter Indication 1: Atrial Fibrillation Lab Used: [facility] [place] Site: [place] INR POC 1.6 INR RANGE 2-3  Dietary changes: no    Health status changes: no    Bleeding/hemorrhagic complications: no    Recent/future hospitalizations: no    Any changes in medication regimen? no    Recent/future dental: no  Any missed doses?: no       Is patient compliant with meds? yes       Allergies: No Known Drug Allergies  Anticoagulation Management History:      The patient is taking warfarin and comes in today for a routine follow up visit.  Positive risk factors for bleeding include an age of 75 years or older.  The bleeding index is 'intermediate risk'.  Positive CHADS2 values include History of HTN and Age > 74 years old.  Her last INR was 0.9.  Anticoagulation responsible provider: Diona Browner MD, Remi Deter.  INR POC: 1.6.  Cuvette Lot#: [id number].  Exp: 10/11.    Anticoagulation Management Assessment/Plan:      The patient's current anticoagulation dose is Warfarin sodium 5 mg tabs: Take as directed by Coumadin Clinic.  The target INR is 2.0-3.0.  The next INR is due 04/22/2009.  Anticoagulation instructions were given to patient.  Results were reviewed/authorized by Vashti Hey, RN.  She was notified by Vashti Hey RN.         Prior Anticoagulation Instructions: INR 2.6 Continue coumadin 2 tablets once daily except 1 1/2 on Sundays, Tuesdays and Thursdays  Current Anticoagulation Instructions: INR 1.6 Take coumadin 3 tablets tonight and tomorrow night then resume 2 tablets once daily except 1 1/2 tablets on Sundays, Tuesdays and Thursdays

## 2010-03-09 NOTE — Medication Information (Signed)
Summary: ccr-lr  Anticoagulant Therapy  Managed by: Vashti Hey, RN PCP: Dr. Kari Baars Supervising MD: Dietrich Pates MD, Molly Maduro Indication 1: Atrial Fibrillation Lab Used: [facility] [place] Site: [place] INR POC 2.8 INR RANGE 2-3  Dietary changes: no    Health status changes: no    Bleeding/hemorrhagic complications: no    Recent/future hospitalizations: no    Any changes in medication regimen? no    Recent/future dental: no  Any missed doses?: no       Is patient compliant with meds? yes       Allergies: No Known Drug Allergies  Anticoagulation Management History:      The patient is taking warfarin and comes in today for a routine follow up visit.  Positive risk factors for bleeding include an age of 75 years or older.  The bleeding index is 'intermediate risk'.  Positive CHADS2 values include History of HTN and Age > 19 years old.  Her last INR was 0.9.  Anticoagulation responsible provider: Dietrich Pates MD, Molly Maduro.  INR POC: 2.8.  Cuvette Lot#: [id number].  Exp: 10/11.    Anticoagulation Management Assessment/Plan:      The patient's current anticoagulation dose is Warfarin sodium 5 mg tabs: Take as directed by Coumadin Clinic.  The target INR is 2.0-3.0.  The next INR is due 09/17/2009.  Anticoagulation instructions were given to patient.  Results were reviewed/authorized by Vashti Hey, RN.  She was notified by Vashti Hey RN.         Prior Anticoagulation Instructions: INR 4.9 Hold coumadin tonight and tomorrow night then decrease dose to 5mg  once daily except 10 on Sundays, Tuesdays and Thursdays  Current Anticoagulation Instructions: INR 2.8 Continue coumadin 5mg  once daily except 10mg  on Sundays, Tuesdays and Thursdays

## 2010-03-09 NOTE — Medication Information (Signed)
Summary: ccr-lr  Anticoagulant Therapy  Managed by: Melissa Hey, RN PCP: Melissa [name] Supervising MD: Dietrich Pates MD, Molly Maduro Indication 1: Atrial Fibrillation Lab Used: [facility] [place] Site: [place] INR POC 1.1 INR RANGE 2-3  Dietary changes: no    Health status changes: no    Bleeding/hemorrhagic complications: no    Recent/future hospitalizations: no    Any changes in medication regimen? no    Recent/future dental: no  Any missed doses?: no       Is patient compliant with meds? yes      Comments: Pt denies missing doses even though INR down to 1.1.  Question complaince.  Allergies: No Known Drug Allergies  Anticoagulation Management History:      The patient is taking warfarin and comes in today for a routine follow up visit.  Positive risk factors for bleeding include an age of 75 years or older.  The bleeding index is 'intermediate risk'.  Positive CHADS2 values include History of HTN and Age > 43 years old.  Her last INR was 0.9.  Anticoagulation responsible provider: Dietrich Pates MD, Molly Maduro.  INR POC: 1.1.  Cuvette Lot#: [id number].  Exp: 10/11.    Anticoagulation Management Assessment/Plan:      The patient's current anticoagulation dose is Warfarin sodium 5 mg tabs: Take as directed by Coumadin Clinic.  The target INR is 2.0-3.0.  The next INR is due 06/03/2009.  Anticoagulation instructions were given to patient.  Results were reviewed/authorized by Melissa Hey, RN.  She was notified by Melissa Hey RN.         Prior Anticoagulation Instructions: INR 1.9 Take coumadin 3 tablets tonight then resume 2 tablets once daily except 1 1/2 tablets on Sundays, Tuesdays and Thursdays  Current Anticoagulation Instructions: INR 1.1 Take coumadin 2 tablets once daily then recheck 06/03/09

## 2010-03-09 NOTE — Medication Information (Signed)
Summary: ccr-lr  Anticoagulant Therapy  Managed by: Vashti Hey, RN PCP: Dr. Kari Baars Supervising MD: Diona Browner MD, Remi Deter Indication 1: Atrial Fibrillation Lab Used: [facility] [place] Site: [place] INR POC 2.1 INR RANGE 2-3  Dietary changes: no    Health status changes: no    Bleeding/hemorrhagic complications: no    Recent/future hospitalizations: no    Any changes in medication regimen? no    Recent/future dental: no  Any missed doses?: no       Is patient compliant with meds? yes       Allergies: No Known Drug Allergies  Anticoagulation Management History:      The patient is taking warfarin and comes in today for a routine follow up visit.  Positive risk factors for bleeding include an age of 75 years or older.  The bleeding index is 'intermediate risk'.  Positive CHADS2 values include History of HTN and Age > 27 years old.  Her last INR was 0.9.  Anticoagulation responsible provider: Diona Browner MD, Remi Deter.  INR POC: 2.1.  Cuvette Lot#: [id number].  Exp: 10/11.    Anticoagulation Management Assessment/Plan:      The patient's current anticoagulation dose is Warfarin sodium 5 mg tabs: Take as directed by Coumadin Clinic.  The target INR is 2.0-3.0.  The next INR is due 02/03/2010.  Anticoagulation instructions were given to patient.  Results were reviewed/authorized by Vashti Hey, RN.  She was notified by Vashti Hey RN.         Prior Anticoagulation Instructions: INR 2.1 Continue coumadin 5mg  once daily except 10mg  on Sundays and Thursdays  Current Anticoagulation Instructions: Same as Prior Instructions.

## 2010-03-09 NOTE — Medication Information (Signed)
Summary: ccr-lr  Anticoagulant Therapy  Managed by: Vashti Hey, RN PCP: Dr. Kari Baars Supervising MD: Dietrich Pates MD, Molly Maduro Indication 1: Atrial Fibrillation Lab Used: [facility] [place] Site:  INR POC 2.1 INR RANGE 2-3  Dietary changes: no    Health status changes: no    Bleeding/hemorrhagic complications: no    Recent/future hospitalizations: no    Any changes in medication regimen? no    Recent/future dental: no  Any missed doses?: no       Is patient compliant with meds? yes       Allergies: No Known Drug Allergies  Anticoagulation Management History:      The patient is taking warfarin and comes in today for a routine follow up visit.  Positive risk factors for bleeding include an age of 75 years or older.  The bleeding index is 'intermediate risk'.  Positive CHADS2 values include History of HTN and Age > 75 years old.  Her last INR was 0.9.  Anticoagulation responsible provider: Dietrich Pates MD, Molly Maduro.  INR POC: 2.1.  Cuvette Lot#: [id number].  Exp: 10/11.    Anticoagulation Management Assessment/Plan:      The patient's current anticoagulation dose is Warfarin sodium 5 mg tabs: Take as directed by Coumadin Clinic.  The target INR is 2.0-3.0.  The next INR is due 01/06/2010.  Anticoagulation instructions were given to patient.  Results were reviewed/authorized by Vashti Hey, RN.  She was notified by Vashti Hey RN.         Prior Anticoagulation Instructions: INR 2.1 Continue coumadin 5mg  once daily except 10mg  on Sundays and Thursdays  Current Anticoagulation Instructions: Same as Prior Instructions.

## 2010-03-09 NOTE — Medication Information (Signed)
Summary: ccr-lr  Anticoagulant Therapy  Managed by: Vashti Hey, RN PCP: Dr. Kari Baars Supervising MD: Dietrich Pates MD, Darlyn Read Used: [facility] [place] Site: [place] INR POC 5.6 INR RANGE 2-3  Dietary changes: no    Health status changes: no    Bleeding/hemorrhagic complications: no    Recent/future hospitalizations: no    Any changes in medication regimen? yes       Details: starts on ABX today per Dr Juanetta Gosling   Has not picked up fron Pharm yet  Recent/future dental: no  Any missed doses?: no       Is patient compliant with meds? yes       Allergies: No Known Drug Allergies  Anticoagulation Management History:      The patient is taking warfarin and comes in today for a routine follow up visit.  Positive risk factors for bleeding include an age of 75 years or older.  The bleeding index is 'intermediate risk'.  Positive CHADS2 values include History of HTN and Age > 14 years old.  Her last INR was 0.9.  Anticoagulation responsible provider: Dietrich Pates MD, Molly Maduro.  INR POC: 5.6.  Cuvette Lot#: [id number].  Exp: 10/11.    Anticoagulation Management Assessment/Plan:      The patient's current anticoagulation dose is Warfarin sodium 5 mg tabs: Take as directed by Coumadin Clinic.  The target INR is 2.0-3.0.  The next INR is due 03/05/2009.  Anticoagulation instructions were given to patient.  Results were reviewed/authorized by Vashti Hey, RN.  She was notified by Vashti Hey RN.         Prior Anticoagulation Instructions: INR 3.8 Do not take coumadin tonight then decrease dose to 2 tablets once daily except 1 1/2 tablets on Mondays  Current Anticoagulation Instructions: INR 5.6 Hold coumadin tonight and tomorrow night, then decrease dose to 10mg  once daily except 7.5mg  on Mondays, Wednesdays and Fridays

## 2010-03-09 NOTE — Medication Information (Signed)
Summary: ccr-lr  Anticoagulant Therapy  Managed by: Vashti Hey, RN PCP: Dr. Kari Baars Supervising MD: Diona Browner MD, Remi Deter Indication 1: Atrial Fibrillation Lab Used: [facility] [place] Site: [place] INR POC 2.6 INR RANGE 2-3  Dietary changes: no    Health status changes: no    Bleeding/hemorrhagic complications: no    Recent/future hospitalizations: no    Any changes in medication regimen? no    Recent/future dental: no  Any missed doses?: no       Is patient compliant with meds? yes       Allergies: No Known Drug Allergies  Anticoagulation Management History:      The patient is taking warfarin and comes in today for a routine follow up visit.  Positive risk factors for bleeding include an age of 75 years or older.  The bleeding index is 'intermediate risk'.  Positive CHADS2 values include History of HTN and Age > 58 years old.  Her last INR was 0.9.  Anticoagulation responsible provider: Diona Browner MD, Remi Deter.  INR POC: 2.6.  Cuvette Lot#: [id number].  Exp: 10/11.    Anticoagulation Management Assessment/Plan:      The patient's current anticoagulation dose is Warfarin sodium 5 mg tabs: Take as directed by Coumadin Clinic.  The target INR is 2.0-3.0.  The next INR is due 04/15/2009.  Anticoagulation instructions were given to patient.  Results were reviewed/authorized by Vashti Hey, RN.  She was notified by Vashti Hey RN.         Prior Anticoagulation Instructions: INR 1.5 Take coumadin 2 1/2 tablets tonight then increase dose to 2 tablets once daily except 1 1/2 tablets on Sundays, Tuesdays and Thursdays  Current Anticoagulation Instructions: INR 2.6 Continue coumadin 2 tablets once daily except 1 1/2 on Sundays, Tuesdays and Thursdays

## 2010-03-09 NOTE — Letter (Signed)
Summary: [facility], [facility] at [employer]  [address], [place] [address]   Phone: [phone]  Fax: [phone]     September 30, 2009 MRN: [id number]   Melissa [name] [address] [place], [place]  [address]      YOUR LAB WORK IS DUE  March 22, 2009 _________________________________________  Please go to [facility], located across the street from [facility] on the second floor.  Hours are Monday - Friday 7am until 7:30pm         Saturday 8am until 12noon    _X_  DO NOT EAT OR DRINK AFTER MIDNIGHT EVENING PRIOR TO LABWORK  __ YOUR LABWORK IS NOT FASTING --YOU MAY EAT PRIOR TO LABWORK

## 2010-03-09 NOTE — Assessment & Plan Note (Signed)
Summary: Z6X   Visit Type:  Follow-up Primary Provider:  Dr. Kari Baars   History of Present Illness: 77 year old woman presents for followup. I saw her back in January. She reports no problems with significant palpitations and has NYHA class II dyspnea on exertion. No chest pain is reported.  I reviewed her most recent echocardiogram detailed below.  We discussed her blood pressure, which is elevated again today. She states that her diet has been not optimal. We discussed sodium and fat restriction. I also talked with her about the possibility of advancing Coreg if needed.  Continues to follow in the Coumadin clinic, no reported bleeding problems.  Clinical Review Panels:  Echocardiogram Echocardiogram   Study Conclusions    1. Left ventricle: The cavity size was normal. Wall thickness was      increased in a pattern of mild to moderate LVH. Systolic function      was normal. The estimated ejection fraction was in the range of      55% to 60%. Wall motion was normal; there were no regional wall      motion abnormalities.   2. Aortic valve: Mildly calcified annulus. Trileaflet.   3. Mitral valve: Mild regurgitation.   4. Left atrium: The atrium was moderately dilated.   5. Right atrium: The atrium was moderately dilated.   6. Tricuspid valve: Moderate regurgitation.   7. Pulmonary arteries: Systolic pressure was moderately increased.      PA peak pressure: 54mm Hg (S).   8. Pericardium, extracardiac: A small pericardial effusion was      identified.   Echocardiography. M-mode, complete 2D, spectral Doppler, and color   Doppler. Patient status: Outpatient. Location: Echo laboratory. (09/08/2009)    Current Medications (verified): 1)  Glimepiride 2 Mg Tabs (Glimepiride) .... Take 1 Tab Daily 2)  Lipitor 20 Mg Tabs (Atorvastatin Calcium) .... Take 1 Tab Daily 3)  Metformin Hcl 500 Mg Tabs (Metformin Hcl) .... Take 1 Tab Two Times A Day 4)  Lisinopril 40 Mg Tabs  (Lisinopril) .... Take 1 Tab Two Times A Day 5)  Norvasc 10 Mg Tabs (Amlodipine Besylate) .... Take 1 Tablet By Mouth Once Daily 6)  Warfarin Sodium 5 Mg Tabs (Warfarin Sodium) .... Take As Directed By Coumadin Clinic 7)  Hydrocodone-Acetaminophen 5-500 Mg Tabs (Hydrocodone-Acetaminophen) .... Take As Needed For Pain 8)  Carvedilol 12.5 Mg Tabs (Carvedilol) .... Take 1 Tab Two Times A Day  Allergies (verified): No Known Drug Allergies  Past History:  Past Medical History: Last updated: 11/03/2008 Hypertension Atrial Fibrillation Diabetes Type 2 Hyperlipidemia  Social History: Last updated: 09/19/2008 Retired  Tobacco Use - No.  Alcohol Use - no Regular Exercise - no Drug Use - no  Review of Systems       The patient complains of dyspnea on exertion.  The patient denies anorexia, fever, chest pain, syncope, peripheral edema, prolonged cough, melena, and hematochezia.         Otherwise reviewed and negative except as outlined.  Vital Signs:  Patient profile:   77 year old female Weight:      170 pounds BMI:     26.72 Pulse rate:   92 / minute BP sitting:   158 / 87  (right arm)  Vitals Entered By: Dreama Saa, CNA (September 30, 2009 1:20 PM)  Physical Exam  Additional Exam:  Overweight woman in no acute distress. HEENT: Conjunctiva and lids normal, oropharynx with moist mucosa. Neck: Supple, no carotid bruits or thyromegaly. Lungs: Clear with  diminished breath sounds, nonlabored. Cardiac: Irregularly irregular, no S3. Abdomen: Soft, nontender, bowel sounds present. Extremities: No pitting edema. Distal pulses one plus.   EKG  Procedure date:  09/30/2009  Findings:      Atrial fibrillation at 84 beats per minute with nonspecific ST-T changes. Single PVC.  Impression & Recommendations:  Problem # 1:  ATRIAL FIBRILLATION (ICD-427.31)  Symptomatically stable on medical therapy. No medication changes made today. Followup in 6 months.  Her updated medication  list for this problem includes:    Warfarin Sodium 5 Mg Tabs (Warfarin sodium) .Marland Kitchen... Take as directed by coumadin clinic    Carvedilol 12.5 Mg Tabs (Carvedilol) .Marland Kitchen... Take 1 tab two times a day  Problem # 2:  ESSENTIAL HYPERTENSION, BENIGN (ICD-401.1)  Blood pressure elevated today. Discussed diet modification with her. She reports compliance with her medications otherwise. Coreg could be further advanced if needed as a means of both blood pressure control and perhaps a little better heart rate control, although she is not experiencing any palpitations.  Her updated medication list for this problem includes:    Lisinopril 40 Mg Tabs (Lisinopril) .Marland Kitchen... Take 1 tab two times a day    Norvasc 10 Mg Tabs (Amlodipine besylate) .Marland Kitchen... Take 1 tablet by mouth once daily    Carvedilol 12.5 Mg Tabs (Carvedilol) .Marland Kitchen... Take 1 tab two times a day  Problem # 3:  MIXED HYPERLIPIDEMIA (ICD-272.2)  Followup lipid profile and liver function tests prior to next visit.  Her updated medication list for this problem includes:    Lipitor 20 Mg Tabs (Atorvastatin calcium) .Marland Kitchen... Take 1 tab daily  Future Orders: T-Lipid Profile ([id number]) ... 03/22/2010 T-Hepatic Function [id number]) ... 03/22/2010  Patient Instructions: 1)  Your physician recommends that you schedule a follow-up appointment in: 6 months 2)  Your physician recommends that you return for lab work in: 6 months, just before next visit 3)  Your physician recommends that you continue on your current medications as directed. Please refer to the Current Medication list given to you today.

## 2010-03-09 NOTE — Medication Information (Signed)
Summary: ccr-lr  Anticoagulant Therapy  Managed by: Vashti Hey, RN PCP: Dr. Kari Baars Supervising MD: Dietrich Pates MD, Molly Maduro Indication 1: Atrial Fibrillation Lab Used: [facility] [place] Site: [place] INR RANGE 2-3  Dietary changes: no    Health status changes: no    Bleeding/hemorrhagic complications: no    Recent/future hospitalizations: no    Any changes in medication regimen? no    Recent/future dental: no  Any missed doses?: no       Is patient compliant with meds? yes       Allergies: No Known Drug Allergies  Anticoagulation Management History:      The patient is taking warfarin and comes in today for a routine follow up visit.  Positive risk factors for bleeding include an age of 75 years or older.  The bleeding index is 'intermediate risk'.  Positive CHADS2 values include History of HTN and Age > 75 years old.  Her last INR was 0.9.  Anticoagulation responsible provider: Dietrich Pates MD, Molly Maduro.  Cuvette Lot#: [id number].  Exp: 10/11.    Anticoagulation Management Assessment/Plan:      The patient's current anticoagulation dose is Warfarin sodium 5 mg tabs: Take as directed by Coumadin Clinic.  The target INR is 2.0-3.0.  The next INR is due 05/27/2009.  Anticoagulation instructions were given to patient.  Results were reviewed/authorized by Vashti Hey, RN.  She was notified by Vashti Hey RN.         Prior Anticoagulation Instructions: INR 3.1 Continue coumadin 10mg  once daily except 7.5mg  on Sundays, Tuesdays and Thursdays  Current Anticoagulation Instructions: INR 1.9 Take coumadin 3 tablets tonight then resume 2 tablets once daily except 1 1/2 tablets on Sundays, Tuesdays and Thursdays

## 2010-03-09 NOTE — Medication Information (Signed)
Summary: ccr-lr  Anticoagulant Therapy  Managed by: Vashti Hey, RN PCP: Dr. Kari Baars Supervising MD: Dietrich Pates MD, Molly Maduro Indication 1: Atrial Fibrillation Lab Used: [facility] [place] Site: [place] INR POC 3.4 INR RANGE 2-3  Dietary changes: no    Health status changes: no    Bleeding/hemorrhagic complications: no    Recent/future hospitalizations: no    Any changes in medication regimen? no    Recent/future dental: no  Any missed doses?: no       Is patient compliant with meds? yes       Allergies: No Known Drug Allergies  Anticoagulation Management History:      The patient is taking warfarin and comes in today for a routine follow up visit.  Positive risk factors for bleeding include an age of 75 years or older.  The bleeding index is 'intermediate risk'.  Positive CHADS2 values include History of HTN and Age > 60 years old.  Her last INR was 0.9.  Anticoagulation responsible provider: Dietrich Pates MD, Molly Maduro.  INR POC: 3.4.  Cuvette Lot#: [id number].  Exp: 10/11.    Anticoagulation Management Assessment/Plan:      The patient's current anticoagulation dose is Warfarin sodium 5 mg tabs: Take as directed by Coumadin Clinic.  The target INR is 2.0-3.0.  The next INR is due 07/23/2009.  Anticoagulation instructions were given to patient.  Results were reviewed/authorized by Vashti Hey, RN.  She was notified by Vashti Hey RN.         Prior Anticoagulation Instructions: INR 3.5 Hold coumadin tonight then resume 10mg  once daily except 5mg  on Mondays and Thursdays  Current Anticoagulation Instructions: INR 3.4 Decrease coumadin to 10mg  once daily except 5mg  on Mondays, Wednesdays and Fridays

## 2010-03-09 NOTE — Medication Information (Signed)
Summary: ccr- pt coming at 1:30pm-lr  Anticoagulant Therapy  Managed by: Vashti Hey, RN PCP: Dr. Kari Baars Supervising MD: Dietrich Pates MD, Molly Maduro Indication 1: Atrial Fibrillation Lab Used: [facility] [place] Site: [place] INR POC 3.1 INR RANGE 2-3  Dietary changes: no    Health status changes: no    Bleeding/hemorrhagic complications: no    Recent/future hospitalizations: no    Any changes in medication regimen? no    Recent/future dental: no  Any missed doses?: no       Is patient compliant with meds? yes       Allergies: No Known Drug Allergies  Anticoagulation Management History:      The patient is taking warfarin and comes in today for a routine follow up visit.  Positive risk factors for bleeding include an age of 31 years or older.  The bleeding index is 'intermediate risk'.  Positive CHADS2 values include History of HTN and Age > 38 years old.  Her last INR was 0.9.  Anticoagulation responsible provider: Dietrich Pates MD, Molly Maduro.  INR POC: 3.1.  Cuvette Lot#: [id number].  Exp: 10/11.    Anticoagulation Management Assessment/Plan:      The patient's current anticoagulation dose is Warfarin sodium 5 mg tabs: Take as directed by Coumadin Clinic.  The target INR is 2.0-3.0.  The next INR is due 08/13/2009.  Anticoagulation instructions were given to patient.  Results were reviewed/authorized by Vashti Hey, RN.  She was notified by Vashti Hey RN.         Prior Anticoagulation Instructions: INR 3.4 Decrease coumadin to 10mg  once daily except 5mg  on Mondays, Wednesdays and Fridays  Current Anticoagulation Instructions: INR 3.1 Take coumdin 1 tablet tonight then resume 2 tablets qd except 1 tablets on Mondays, Wednesdays and Fridays

## 2010-03-09 NOTE — Medication Information (Signed)
Summary: ccr-lr  Anticoagulant Therapy  Managed by: Vashti Hey, RN PCP: Dr. Kari Baars Supervising MD: Eden Emms MD, Rometta Emery Used: [facility] [place] Site: [place] INR POC 2.9 INR RANGE 2-3  Dietary changes: no    Health status changes: no    Bleeding/hemorrhagic complications: no    Recent/future hospitalizations: no    Any changes in medication regimen? yes       Details: was started on Keflex 500mg  tid on 03/02/09  Recent/future dental: no  Any missed doses?: no       Is patient compliant with meds? yes       Allergies: No Known Drug Allergies  Anticoagulation Management History:      The patient is taking warfarin and comes in today for a routine follow up visit.  Positive risk factors for bleeding include an age of 75 years or older.  The bleeding index is 'intermediate risk'.  Positive CHADS2 values include History of HTN and Age > 71 years old.  Her last INR was 0.9.  Anticoagulation responsible provider: Eden Emms MD, Theron Arista.  INR POC: 2.9.  Cuvette Lot#: [id number].  Exp: 10/11.    Anticoagulation Management Assessment/Plan:      The patient's current anticoagulation dose is Warfarin sodium 5 mg tabs: Take as directed by Coumadin Clinic.  The target INR is 2.0-3.0.  The next INR is due 03/11/2009.  Anticoagulation instructions were given to patient.  Results were reviewed/authorized by Vashti Hey, RN.  She was notified by Vashti Hey RN.         Prior Anticoagulation Instructions: INR 5.6 Hold coumadin tonight and tomorrow night, then decrease dose to 10mg  once daily except 7.5mg  on Mondays, Wednesdays and Fridays  Current Anticoagulation Instructions: INR 2.9 Will finish Keflex on Monday 03/09/09 Take coumadin 1 1/2 tablets x 3 nights then resume 2 tablets once daily except 1 1/2 tablets on Mondays, Wednesdays and Fridays

## 2010-03-11 NOTE — Medication Information (Signed)
Summary: ccr-lr  Anticoagulant Therapy  Managed by: Vashti Hey, RN PCP: Dr. Kari Baars Supervising MD: Dietrich Pates MD, Molly Maduro Indication 1: Atrial Fibrillation Lab Used: [facility]  Site: [place] INR POC 2.0 INR RANGE 2-3  Dietary changes: no    Health status changes: no    Bleeding/hemorrhagic complications: no    Recent/future hospitalizations: no    Any changes in medication regimen? no    Recent/future dental: no  Any missed doses?: no       Is patient compliant with meds? yes       Allergies: No Known Drug Allergies  Anticoagulation Management History:      The patient is taking warfarin and comes in today for a routine follow up visit.  Positive risk factors for bleeding include an age of 75 years or older.  The bleeding index is 'intermediate risk'.  Positive CHADS2 values include History of HTN and Age > 72 years old.  Her last INR was 0.9.  Anticoagulation responsible provider: Dietrich Pates MD, Molly Maduro.  INR POC: 2.0.  Cuvette Lot#: [id number].  Exp: 10/11.    Anticoagulation Management Assessment/Plan:      The patient's current anticoagulation dose is Warfarin sodium 5 mg tabs: Take as directed by Coumadin Clinic.  The target INR is 2.0-3.0.  The next INR is due 03/03/2010.  Anticoagulation instructions were given to patient.  Results were reviewed/authorized by Vashti Hey, RN.  She was notified by Vashti Hey RN.         Prior Anticoagulation Instructions: INR 2.1 Continue coumadin 5mg  once daily except 10mg  on Sundays and Thursdays  Current Anticoagulation Instructions: INR 2.0 Take coumadin 1 1/2 tablets tonight then resume 1 tablet once daily except 2 tablets on Sundays and Thursdays

## 2010-03-11 NOTE — Medication Information (Signed)
Summary: ccr-lr  Anticoagulant Therapy  Managed by: Vashti Hey, RN PCP: Dr. Kari Baars Supervising MD: Diona Browner MD, Remi Deter Indication 1: Atrial Fibrillation Lab Used: [facility] [place] Site: [place] INR POC 2.6 INR RANGE 2-3  Dietary changes: no    Health status changes: no    Bleeding/hemorrhagic complications: no    Recent/future hospitalizations: no    Any changes in medication regimen? no    Recent/future dental: no  Any missed doses?: no       Is patient compliant with meds? yes       Allergies: No Known Drug Allergies  Anticoagulation Management History:      The patient is taking warfarin and comes in today for a routine follow up visit.  Positive risk factors for bleeding include an age of 75 years or older.  The bleeding index is 'intermediate risk'.  Positive CHADS2 values include History of HTN and Age > 53 years old.  Her last INR was 0.9.  Anticoagulation responsible provider: Diona Browner MD, Remi Deter.  INR POC: 2.6.  Cuvette Lot#: [id number].  Exp: 10/11.    Anticoagulation Management Assessment/Plan:      The patient's current anticoagulation dose is Warfarin sodium 5 mg tabs: Take as directed by Coumadin Clinic.  The target INR is 2.0-3.0.  The next INR is due 04/01/2010.  Anticoagulation instructions were given to patient.  Results were reviewed/authorized by Vashti Hey, RN.  She was notified by Vashti Hey RN.         Prior Anticoagulation Instructions: INR 2.0 Take coumadin 1 1/2 tablets tonight then resume 1 tablet once daily except 2 tablets on Sundays and Thursdays  Current Anticoagulation Instructions: INR 2.6 Continue coumadin 5mg  once daily except 10mg  on Sundays and Thursdays

## 2010-03-19 ENCOUNTER — Encounter: Payer: Self-pay | Admitting: Cardiology

## 2010-03-23 LAB — CONVERTED CEMR LAB
ALT: 10 units/L (ref 0–35)
AST: 24 units/L (ref 0–37)
Bilirubin, Direct: 0.2 mg/dL (ref 0.0–0.3)
Cholesterol: 182 mg/dL (ref 0–200)
Indirect Bilirubin: 0.6 mg/dL (ref 0.0–0.9)
Total Protein: 7.7 g/dL (ref 6.0–8.3)
Triglycerides: 61 mg/dL (ref <150)
VLDL: 12 mg/dL (ref 0–40)

## 2010-03-29 ENCOUNTER — Encounter (INDEPENDENT_AMBULATORY_CARE_PROVIDER_SITE_OTHER): Payer: Medicare Other

## 2010-03-29 ENCOUNTER — Encounter: Payer: Self-pay | Admitting: Cardiovascular Disease

## 2010-03-29 DIAGNOSIS — I4891 Unspecified atrial fibrillation: Secondary | ICD-10-CM

## 2010-03-29 DIAGNOSIS — Z7901 Long term (current) use of anticoagulants: Secondary | ICD-10-CM

## 2010-04-06 NOTE — Medication Information (Signed)
Summary: ccr-lr  Anticoagulant Therapy  Managed by: Vashti Hey, RN PCP: Dr. Kari Baars Supervising MD: Clifton James MD, Cristal Deer Indication 1: Atrial Fibrillation Lab Used: [facility] [place] Site: [place] INR POC 2.6 INR RANGE 2-3  Dietary changes: no    Health status changes: no    Bleeding/hemorrhagic complications: no    Recent/future hospitalizations: no    Any changes in medication regimen? no    Recent/future dental: no  Any missed doses?: no       Is patient compliant with meds? yes       Allergies: No Known Drug Allergies  Anticoagulation Management History:      The patient is taking warfarin and comes in today for a routine follow up visit.  Positive risk factors for bleeding include an age of 75 years or older.  The bleeding index is 'intermediate risk'.  Positive CHADS2 values include History of HTN and Age > 58 years old.  Her last INR was 0.9.  Anticoagulation responsible provider: Clifton James MD, Cristal Deer.  INR POC: 2.6.  Cuvette Lot#: [id number].  Exp: 10/11.    Anticoagulation Management Assessment/Plan:      The patient's current anticoagulation dose is Warfarin sodium 5 mg tabs: Take as directed by Coumadin Clinic.  The target INR is 2.0-3.0.  The next INR is due 04/26/2010.  Anticoagulation instructions were given to patient.  Results were reviewed/authorized by Vashti Hey, RN.  She was notified by Vashti Hey RN.         Prior Anticoagulation Instructions: INR 2.6 Continue coumadin 5mg  once daily except 10mg  on Sundays and Thursdays  Current Anticoagulation Instructions: Same as Prior Instructions.

## 2010-04-08 ENCOUNTER — Ambulatory Visit (INDEPENDENT_AMBULATORY_CARE_PROVIDER_SITE_OTHER): Payer: Medicare Other | Admitting: Cardiology

## 2010-04-08 ENCOUNTER — Encounter: Payer: Self-pay | Admitting: Cardiology

## 2010-04-08 DIAGNOSIS — I10 Essential (primary) hypertension: Secondary | ICD-10-CM

## 2010-04-08 DIAGNOSIS — I4891 Unspecified atrial fibrillation: Secondary | ICD-10-CM

## 2010-04-08 DIAGNOSIS — E782 Mixed hyperlipidemia: Secondary | ICD-10-CM

## 2010-04-09 ENCOUNTER — Encounter: Payer: Self-pay | Admitting: Cardiology

## 2010-04-15 NOTE — Assessment & Plan Note (Signed)
Summary: 6 mth f/u per checkout on 09/30/09/tmj   Visit Type:  Follow-up Primary Provider:  Dr. Kari Baars   History of Present Illness: 77 year old woman presents for followup. She was seen in August 2011. She states she has been doing fairly well except for a recent "head cold." No exertional chest pain. No significant problem with palpitations. She states that she has been trying to watch her diet more closely. Blood pressure is better controlled today.  Lab work from 10 February showed cholesterol 182, triglycerides 61, HDL 69, LDL 101, AST 24, ALT 10. We discussed the results.  Current Medications (verified): 1)  Glimepiride 2 Mg Tabs (Glimepiride) .... Take 1 Tab Daily 2)  Lipitor 20 Mg Tabs (Atorvastatin Calcium) .... Take 1 Tab Daily 3)  Metformin Hcl 500 Mg Tabs (Metformin Hcl) .... Take 1 Tab Two Times A Day 4)  Lisinopril 40 Mg Tabs (Lisinopril) .... Take 1 Tab Two Times A Day 5)  Norvasc 10 Mg Tabs (Amlodipine Besylate) .... Take 1 Tablet By Mouth Once Daily 6)  Warfarin Sodium 5 Mg Tabs (Warfarin Sodium) .... Take As Directed By Coumadin Clinic 7)  Hydrocodone-Acetaminophen 5-500 Mg Tabs (Hydrocodone-Acetaminophen) .... Take As Needed For Pain 8)  Carvedilol 12.5 Mg Tabs (Carvedilol) .... Take 1 Tab Two Times A Day  Allergies (verified): No Known Drug Allergies  Comments:  Nurse/Medical Assistant: patient brought meds she uses [place] [facility]  Past History:  Past Medical History: Last updated: 11/03/2008 Hypertension Atrial Fibrillation Diabetes Type 2 Hyperlipidemia  Social History: Last updated: 09/19/2008 Retired  Tobacco Use - No.  Alcohol Use - no Regular Exercise - no Drug Use - no  Review of Systems       The patient complains of dyspnea on exertion.  The patient denies anorexia, fever, chest pain, syncope, peripheral edema, hemoptysis, melena, and hematochezia.         Otherwise reviewed and negative except as outlined.  Vital  Signs:  Patient profile:   77 year old female Weight:      166 pounds BMI:     26.09 Pulse rate:   59 / minute BP sitting:   135 / 74  (left arm)  Vitals Entered By: Dreama Saa, CNA (April 08, 2010 12:58 PM)  Physical Exam  Additional Exam:  Overweight woman in no acute distress. HEENT: Conjunctiva and lids normal, oropharynx with moist mucosa. Neck: Supple, no carotid bruits or thyromegaly. Lungs: Clear with diminished breath sounds, nonlabored. Cardiac: Irregularly irregular, no S3. Abdomen: Soft, nontender, bowel sounds present. Extremities: No pitting edema. Distal pulses one plus. Skin: Warm and dry. Musculoskeletal: No kyphosis. Neuropsychiatric: Alert and oriented x3, affect appropriate.   EKG  Procedure date:  04/08/2010  Findings:      Atrial fibrillation at 66 beats per minute, nonspecific T wave changes.  Impression & Recommendations:  Problem # 1:  ATRIAL FIBRILLATION (ICD-427.31)  Symptomatically stable. Continue present regimen. Follow up in 6 months.  Her updated medication list for this problem includes:    Warfarin Sodium 5 Mg Tabs (Warfarin sodium) .Marland Kitchen... Take as directed by coumadin clinic    Carvedilol 12.5 Mg Tabs (Carvedilol) .Marland Kitchen... Take 1 tab two times a day  Problem # 2:  MIXED HYPERLIPIDEMIA (ICD-272.2)  Continue present course with followup fasting lipid profile and liver function tests for the next visit.  Her updated medication list for this problem includes:    Lipitor 20 Mg Tabs (Atorvastatin calcium) .Marland Kitchen... Take 1 tab daily  Future Orders: T-Lipid  Profile [id number]) ... 10/04/2010 T-Hepatic Function [id number]) ... 10/04/2010  Problem # 3:  ESSENTIAL HYPERTENSION, BENIGN (ICD-401.1)  Blood-pressure trend is better.  Her updated medication list for this problem includes:    Lisinopril 40 Mg Tabs (Lisinopril) .Marland Kitchen... Take 1 tab two times a day    Norvasc 10 Mg Tabs (Amlodipine besylate) .Marland Kitchen... Take 1 tablet by mouth once daily     Carvedilol 12.5 Mg Tabs (Carvedilol) .Marland Kitchen... Take 1 tab two times a day  Patient Instructions: 1)  Your physician recommends that you schedule a follow-up appointment in: 6 months 2)  Your physician recommends that you return for a FASTING lipid profile: in 6 months just before next office visit 3)  Your physician recommends that you continue on your current medications as directed. Please refer to the Current Medication list given to you today.

## 2010-04-15 NOTE — Letter (Signed)
Summary: [place] [facility], [facility] at [employer]  [address], [place] [address]   Phone: [phone]  Fax: [phone]     April 08, 2010 MRN: [id number]   Melissa [name] [address] [place], [place]  [address]      YOUR LAB WORK IS DUE  October 04, 2010 _________________________________________  Please go to [facility], located across the street from [facility] on the second floor.  Hours are Monday - Friday 7am until 7:30pm         Saturday 8am until 12noon    _X_  DO NOT EAT OR DRINK AFTER MIDNIGHT EVENING PRIOR TO LABWORK  __ YOUR LABWORK IS NOT FASTING --YOU MAY EAT PRIOR TO LABWORK

## 2010-04-26 ENCOUNTER — Encounter (INDEPENDENT_AMBULATORY_CARE_PROVIDER_SITE_OTHER): Payer: Medicare Other

## 2010-04-26 ENCOUNTER — Encounter: Payer: Self-pay | Admitting: Cardiology

## 2010-04-26 DIAGNOSIS — I4891 Unspecified atrial fibrillation: Secondary | ICD-10-CM

## 2010-04-26 DIAGNOSIS — Z7901 Long term (current) use of anticoagulants: Secondary | ICD-10-CM

## 2010-04-26 LAB — CONVERTED CEMR LAB: POC INR: 2

## 2010-05-05 ENCOUNTER — Encounter: Payer: Self-pay | Admitting: Cardiology

## 2010-05-05 DIAGNOSIS — I4891 Unspecified atrial fibrillation: Secondary | ICD-10-CM

## 2010-05-05 DIAGNOSIS — Z7901 Long term (current) use of anticoagulants: Secondary | ICD-10-CM | POA: Insufficient documentation

## 2010-05-06 NOTE — Medication Information (Signed)
Summary: ccr-lr  Anticoagulant Therapy  Managed by: Vashti Hey, RN PCP: Dr. Kari Baars Supervising MD: Dietrich Pates MD, Molly Maduro Indication 1: Atrial Fibrillation Lab Used: [facility] [place] Site: [place] INR POC 2.0 INR RANGE 2-3  Dietary changes: no    Health status changes: no    Bleeding/hemorrhagic complications: no    Recent/future hospitalizations: no    Any changes in medication regimen? no    Recent/future dental: no  Any missed doses?: no       Is patient compliant with meds? yes       Allergies: No Known Drug Allergies  Anticoagulation Management History:      The patient is taking warfarin and comes in today for a routine follow up visit.  Positive risk factors for bleeding include an age of 75 years or older.  The bleeding index is 'intermediate risk'.  Positive CHADS2 values include History of HTN and Age > 63 years old.  Her last INR was 0.9.  Anticoagulation responsible provider: Dietrich Pates MD, Molly Maduro.  INR POC: 2.0.  Cuvette Lot#: [id number].  Exp: 10/11.    Anticoagulation Management Assessment/Plan:      The patient's current anticoagulation dose is Warfarin sodium 5 mg tabs: Take as directed by [facility].  The target INR is 2.0-3.0.  The next INR is due 05/24/2010.  Anticoagulation instructions were given to patient.  Results were reviewed/authorized by Vashti Hey, RN.  She was notified by Vashti Hey RN.         Prior Anticoagulation Instructions: INR 2.6 Continue coumadin 5mg  once daily except 10mg  on Sundays and Thursdays  Current Anticoagulation Instructions: INR 2.0 Take coumadin 1 1/2 tablets tonight then resume 1 tablet once daily except 2 tablets on Sundays and Thursdays

## 2010-05-15 LAB — BLOOD GAS, ARTERIAL
Acid-base deficit: 0.3 mmol/L (ref 0.0–2.0)
FIO2: 0.21 %
O2 Saturation: 96.9 %
Patient temperature: 37
pCO2 arterial: 36.9 mmHg (ref 35.0–45.0)

## 2010-05-20 ENCOUNTER — Other Ambulatory Visit: Payer: Self-pay | Admitting: Cardiology

## 2010-05-20 ENCOUNTER — Other Ambulatory Visit: Payer: Self-pay

## 2010-05-20 MED ORDER — AMLODIPINE BESYLATE 10 MG PO TABS: 10.0000 mg | ORAL_TABLET | Freq: Every day | ORAL | Status: DC

## 2010-05-24 ENCOUNTER — Ambulatory Visit (INDEPENDENT_AMBULATORY_CARE_PROVIDER_SITE_OTHER): Payer: Medicare Other | Admitting: *Deleted

## 2010-05-24 DIAGNOSIS — Z7901 Long term (current) use of anticoagulants: Secondary | ICD-10-CM

## 2010-05-24 DIAGNOSIS — I4891 Unspecified atrial fibrillation: Secondary | ICD-10-CM

## 2010-05-24 LAB — POCT INR: INR: 2.3

## 2010-06-21 ENCOUNTER — Ambulatory Visit (INDEPENDENT_AMBULATORY_CARE_PROVIDER_SITE_OTHER): Payer: Medicare Other | Admitting: *Deleted

## 2010-06-21 DIAGNOSIS — I4891 Unspecified atrial fibrillation: Secondary | ICD-10-CM

## 2010-06-21 DIAGNOSIS — Z7901 Long term (current) use of anticoagulants: Secondary | ICD-10-CM

## 2010-06-22 NOTE — Assessment & Plan Note (Signed)
[facility]                       [facility] OFFICE NOTE   NAME:[name], Melissa [name]                     MRN:          [id number]  DATE:09/09/2008                            DOB:          [birth year]    PRIMARY CARE PHYSICIAN:  Ramon Dredge L. Juanetta Gosling, MD   REASON FOR VISIT:  Atrial fibrillation.   PRIMARY CARDIOLOGIST: [place] Bing   HISTORY OF PRESENT ILLNESS:  77 year old African American female seen by  Dr. Juanetta Gosling on September 04, 2008, secondary to some swelling and pain in her  left foot.  It appeared that it was infected.  The patient was noted to  have an irregular heart rate at that time as well.  An EKG was completed  on that visit revealing atrial fibrillation with a rate of 86 beats per  minute.  This apparently is a new finding as Dr. Juanetta Gosling stated that  this had not been a problem for her before.  As a result of this, the  patient was referred to our office for further evaluation.  It is  uncertain how long that she has been in atrial fibrillation and she does  not report any major sense of palpitations.   The patient states that she has no chest pain.  She does have some  shortness of breath with exertion.  She denies any dizziness.  She  denies any bleeding difficulties.  She denies any issues with presyncope  or headaches and has not noticed irregular heart rhythm or rate or  palpitations.   REVIEW OF SYSTEMS:  As above.  Positive for shortness of breath with  exertion.  All other systems reviewed and are found to be negative other  than those listed.   PAST MEDICAL HISTORY:  Non-insulin-dependent diabetes for 10 years or  greater, hypertension greater than 15 years, and hypercholesterolemia  approximately 10 years.   PAST SURGICAL HISTORY:  The patient did have a total left hip  replacement in 2004.   FAMILY HISTORY:  Mother deceased with complications of diabetes; father  deceased with complications of diabetes and  hypertension. Unknown health  status of siblings   SOCIAL HISTORY:  The patient does not drink.  She does not smoke.  She  does not abuse drugs.  The patient is a widow, living in [place].   CURRENT MEDICATIONS:  1. Glimepiride 2 mg daily.  2. Lipitor 20 mg daily.  3. Metformin HCl 500 mg b.i.d.  4. Lisinopril 40 mg b.i.d.  5. Aspirin 81 mg daily.   ALLERGIES:  No known drug allergies.   LABORATORY DATA:  Cholesterol 180, triglycerides 62, HDL 80, LDL is 88.  LFTs are within normal limits, bilirubin 0.8, direct bilirubin 0.1,  indirect bilirubin 0.7, alkaline phosphatase 112, SGOT is 27, SGPT is  16, total protein 7.7, albumin 4.4, uric acid 6.8, and her TSH was found  to be 1.981.   EKG in the office revealed atrial fibrillation with nonspecific T-wave  abnormality, ventricular rate of 89 beats per minute.   Echocardiogram dated September 08, 2008 revealed left ventricular cavity  size normal, wall thickness was increased in a pattern  of mild to  moderate LVH, systolic function was normal with an EF of 55-60%.  Aortic  valve with mildly calcified annulus and trileaflet.  Mitral valve; mild  regurg.  Left atrium; atrium was moderately dilated.  Right atrium;  atrium was moderately dilated.  Tricuspid valve; moderate regurg.  Pulmonary artery; systolic pressure was moderately increased with a peak  PA pressure of 54 mmHg (S).  Pericardium, extracardiac; a small  pericardial effusion was identified.   PHYSICAL EXAMINATION:  VITAL SIGNS:  Blood pressure 191/103 in the right  arm, 201/136 in the left arm; pulse is 90 and irregular; and weight 188  pounds.  HEENT:  Head is normocephalic and atraumatic.  Eyes, PERRLA.  Mucous  membranes of mouth are pink and moist.  Tongue is midline.  NECK:  Supple.  There is no JVD.  No carotid bruits appreciated.  CARDIOVASCULAR:  Irregular rhythm without murmurs, rubs, or gallops at  present.  LUNGS:  Mild inspiratory wheezes noted without  rales or rhonchi.  ABDOMEN:  Soft and nontender.  No abdominal bruits are appreciated.  EXTREMITIES:  Femoral pulses are 1+ bilaterally.  Radial pulses are 1+  bilaterally.  Dorsalis pedis and posterior tibial pulses are 1+  bilaterally.  SKIN:  Warm and dry.  NEURO:  The patient is severely hard of hearing.  Otherwise, no neuro  deficits.  PSYCHOSOCIAL:  The patient has a pleasant affect.   IMPRESSION AND PLAN:  1. Atrial fibrillation, uncertain duration.  Her rate is in the 80s to      90s on no specific rate-lowering medications.  Her CHAD2 score is 3      based on hypertension, age, and diabetes.  We will increase her      baby aspirin to 325 mg p.o. daily and ultimately discuss Coumadin      once other baseline data are collected.  We will draw a baseline      PT/INR.  She has multiple cardiovascular risk factors to include      hypertension, hypercholesterolemia, and diabetes.  We will plan      stress Myoview (Lexiscan) and evaluate further for ischemic      etiology of atrial fibrillation.  She will return over the next few      weeks.  2. Hypertension, not well controlled.  The patient is currently on      lisinopril 40 mg b.i.d., which is a maximum dose.  We will plan to      start her on Norvasc 5 mg p.o. daily as well for better blood      pressure control.  She will follow up in the office for blood      pressure check at next appointment.  3. Possible lung disease with inspiratory wheezes noted.  We will      schedule PFTs to evaluate      the patient's lung status in the setting of pulmonary hypertension      per echocardiogram.  We will also get a baseline chest x-ray.      Bettey Mare. Lyman Bishop, NP  Electronically Signed      Jonelle Sidle, MD  Electronically Signed   KML/MedQ  DD: 09/09/2008  DT: 09/10/2008  Job #: [id number]   cc:   Ramon Dredge L. Juanetta Gosling, M.D.

## 2010-06-22 NOTE — Procedures (Signed)
Melissa [name], Melissa [name]              ACCOUNT NO.:  [id number]   MEDICAL RECORD NO.:  [id number]          PATIENT TYPE:  OUT   LOCATION:  RESP                          FACILITY:  [facility]   PHYSICIAN:  Edward L. Juanetta Gosling, M.D.DATE OF BIRTH:  05/04/1930   DATE OF PROCEDURE:  DATE OF DISCHARGE:  09/29/2008                            PULMONARY FUNCTION TEST   1. Spirometry shows a mild ventilatory defect with mild airflow      obstruction.  2. Lung volumes show evidence of restrictive change with total lung      capacity of 62% of predicted.  3. DLCO is mildly reduced.  4. Arterial blood gases normal.      Edward L. Juanetta Gosling, M.D.  Electronically Signed     ELH/MEDQ  D:  09/30/2008  T:  10/01/2008  Job:  [id number]   cc:   Jonelle Sidle, MD  [address]  [place], [place] [address]

## 2010-06-25 NOTE — Group Therapy Note (Signed)
NAME:  Melissa [name], Melissa [name]                        ACCOUNT NO.:  [id number]   MEDICAL RECORD NO.:  [id number]                   PATIENT TYPE:  INP   LOCATION:  A332                                 FACILITY:  [facility]   PHYSICIAN:  Vickki Hearing, M.D.           DATE OF BIRTH:  05-May-1930   DATE OF PROCEDURE:  01/15/2003  DATE OF DISCHARGE:                                   PROGRESS NOTE   SUBJECTIVE:  This is the first postoperative day for [facility].  She  had a left total hip replacement with a hybrid technique.  She is doing  well, complains of minimal to no pain at this time.  She is not nauseous or  vomiting.  She had a good night's sleep.   OBJECTIVE:  Physical exam:  She is awake and alert, has normal mental  status.  Her left lower extremity has normal neurovascular function.  Range  of motion of the hip, internal-external rotation, with full extension showed  no pain or instability.  She had minimal thigh swelling.  Pulses were  intact.   Hemoglobin was 6 - down from 10.   PLAN:  Recommend 2 units of blood, out of bed to chair, and start Lovenox  today.      ___________________________________________                                            Vickki Hearing, M.D.   SEH/MEDQ  D:  01/15/2003  T:  01/15/2003  Job:  [id number]

## 2010-06-25 NOTE — Group Therapy Note (Signed)
NAME:  Melissa [name], Melissa [name]                        ACCOUNT NO.:  [id number]   MEDICAL RECORD NO.:  [id number]                   PATIENT TYPE:  INP   LOCATION:  A332                                 FACILITY:  [facility]   PHYSICIAN:  Vickki Hearing, M.D.           DATE OF BIRTH:  10-05-1930   DATE OF PROCEDURE:  DATE OF DISCHARGE:                                   PROGRESS NOTE   SUBJECTIVE:  This is postoperative day #2, status post left total hip  replacement with cemented femoral stem. She is doing well, complains of  minimal pain, no nausea or vomiting.  Had a good night's sleep; ate a good  breakfast.   OBJECTIVE:  Physical exam she is awake and alert.  Normal mental status.  She has normal left lower extremity neurovascular function.  Minimal thigh  swelling.  Pulses remain intact.  Hemoglobin is up to 8 after 2 units of  blood.   ASSESSMENT:  Stable.   PLAN:  Continue hip protocol, ambulate as tolerated, remove all IVs,  Foley's, PCAs and continue with present treatment plan.      ___________________________________________                                            Vickki Hearing, M.D.   SEH/MEDQ  D:  01/16/2003  T:  01/16/2003  Job:  [id number]

## 2010-06-25 NOTE — Group Therapy Note (Signed)
NAME:  Melissa [name], Melissa [name]                        ACCOUNT NO.:  [id number]   MEDICAL RECORD NO.:  [id number]                   PATIENT TYPE:  INP   LOCATION:  A332                                 FACILITY:  [facility]   PHYSICIAN:  Edward L. Juanetta Gosling, M.D.             DATE OF BIRTH:  1930/03/17   DATE OF PROCEDURE:  01/15/2003  DATE OF DISCHARGE:                                   PROGRESS NOTE   PROBLEM:  Status post surgery for hip replacement.   SUBJECTIVE:  Melissa [name] says that she is feeling great today.  She has no  complaints.   OBJECTIVE:  Her exam shows that temperature is 97.8, pulse 51, respirations  20, blood sugar 135, blood pressure 108/51.  Her chest is clear, her heart  is regular, her abdomen is soft.  Hemoglobin is 6.4 this morning.   ASSESSMENT:  She seems to be doing very well.  Her blood pressure is  extremely well controlled, her blood sugar is also well controlled.  She is  anemic and Dr. Romeo Apple has ordered a blood transfusion which is obviously  appropriate.      ___________________________________________                                            Oneal Deputy. Juanetta Gosling, M.D.   ELH/MEDQ  D:  01/15/2003  T:  01/15/2003  Job:  [id number]

## 2010-06-25 NOTE — Group Therapy Note (Signed)
NAME:  Melissa [name], Melissa [name]                        ACCOUNT NO.:  [id number]   MEDICAL RECORD NO.:  [id number]                   PATIENT TYPE:  INP   LOCATION:  A332                                 FACILITY:  [facility]   PHYSICIAN:  Edward L. Juanetta Gosling, M.D.             DATE OF BIRTH:  Jul 12, 1930   DATE OF PROCEDURE:  01/16/2003  DATE OF DISCHARGE:                                   PROGRESS NOTE   PROBLEMS:  1. Status post hip replacement.  2. Diabetes.  3. Hypertension.   SUBJECTIVE:  Ms. Fredlund says she is doing great and feels well.  She is  getting up and moving around today.  She has no new complaints at this  point.   OBJECTIVE:  Her blood pressure is 120/70, pulse is 60, respirations are 18.  Her chest is clear, her heart is regular.  I have not seen a CBC on her  since she had her blood yesterday.  Her blood sugars have been well  controlled.   ASSESSMENT:  She has hypertension which is well controlled.  She has  diabetes which is well controlled.   PLAN:  My plan is to continue treatments and follow.      ___________________________________________                                            Oneal Deputy Juanetta Gosling, M.D.   ELH/MEDQ  D:  01/16/2003  T:  01/16/2003  Job:  [id number]

## 2010-06-25 NOTE — Op Note (Signed)
NAME:  Melissa [name], Melissa [name]                        ACCOUNT NO.:  [id number]   MEDICAL RECORD NO.:  [id number]                   PATIENT TYPE:  INP   LOCATION:  A332                                 FACILITY:  [facility]   PHYSICIAN:  Vickki Hearing, M.D.           DATE OF BIRTH:  1930/12/28   DATE OF PROCEDURE:  DATE OF DISCHARGE:                                 OPERATIVE REPORT   HISTORY:  This is a 77 year old female with avascular necrosis of the left  hip developed osteoarthritis of the left hip.  She had severe pain which  prevented her from performing the usual activities of daily living.  She  opted for a total hip replacement versus continued conservative care.   PREOPERATIVE DIAGNOSS:  Avascular necrosis left hip.   POSTOPERATIVE DIAGNOSS:  Avascular necrosis left hip.   PROCEDURE:  Left total hip arthroplasty.   SURGEON:  Vickki Hearing, M.D.   IMPLANT:  Stryker cement 8 EON stem, 32-mm head with a 54 cup and a +10 neck  length.   ESTIMATED BLOOD LOSS:  500 cc.   ANESTHETIC:  Spinal.   DETAILS OF PROCEDURE:  The patient was identified as [facility].  The  left hip had been marked for surgery.  My initials were placed over this  area with the procedure.  She was given a gram of Ancef. She was taken to  the operating room for a spinal anesthetic.  A Foley catheter was placed.  We took a time out and confirmed the patient, procedure, and left limb for  surgery and proceeded.  After sterile prep and drape a skin incision was  made.   The skin incision was made over the greater trochanter, extended proximally  and distally, curved gently posteriorly and divided down to the fascia which  was split in line with the skin incision.  The abductor mass was identified  after bursectomy.  A gluteus medius/vastus lateralis soft tissue sleeve was  elevated from the trochanter and proximal femur and retracted superiorly and  anteriorly.  The capsule of the hip was excised.  The acetabulum was cleaned  of debris and osteophytes.  Retractors were placed anteriorly, posteriorly,  and inferiorly.   Serial reamings were performed with the acetabular reamers up to a size 54  cup which was then placed with 2 screws.  The femur was then axillary reamed  with tapered reamers.  Entry point was lateralized with a box osteotome.  Provisional neck cut was made and then refined. We did serial broachings and  settled on a cement 8 trial prosthesis.  We then trialed with #0 +5 and +10  neck lengths with a 32 mm head and the +10 seemed to be the most stable and  gave the best abductor tension.   External rotation was 45 degrees with 5 degrees of hyperextension.  Flexion  was 110-115 degrees, internal rotation was 60 degrees with no dislocation.  The wound was irrigated.  The femoral canal was prepared for cementing with  irrigation, distal cement plug placement.  Brushing of the canal, suctioning  and drying with Raytex.  Raytex sponges were removed.  Cement was placed  retrograde and then we pressurized it, placed the stem, put the +10 neck on.  Put the real acetabular liner in and then reduced the hip and got it in  stable reduction, as previously stated.   Irrigation of the wound was performed.  The abductors were closed with  interrupted suture and 1 suture through the greater trochanter through drill  holes. Good abduction repair was performed.  The hip was ranged again and  found to be stable and we proceeded to close the fascia with interrupted  Bralon and then used #0 and 2-0 Vicryl to close the subcu.  Placed staples  on the skin, sterile bandage.  Put an abduction pillow in place and took the  patient back to the recovery room in stable condition for routine  postoperative protocol.      ___________________________________________                                            Vickki Hearing, M.D.   SEH/MEDQ  D:  01/14/2003  T:  01/15/2003  Job:   [id number]

## 2010-06-25 NOTE — H&P (Signed)
NAME:  Melissa [name], Melissa [name] NO.:  [id number]   MEDICAL RECORD NO.:  [id number]                  PATIENT TYPE:   LOCATION:                                       FACILITY:   PHYSICIAN:  Vickki Hearing, M.D.           DATE OF BIRTH:   DATE OF ADMISSION:  DATE OF DISCHARGE:                                HISTORY & PHYSICAL   CHIEF COMPLAINT:  Pain in the left lower extremity.   HISTORY OF PRESENT ILLNESS:  Melissa [name] is a 77 year old female who was  first seen several months ago with left thigh pain and left hip pain.  She  had a workup which included x-rays and MRI, and she has a degenerative left  hip which appears to be related to avascular necrosis.  She was also seen  for x-rays of her lumbar spine which showed some anterolisthesis and disk  space narrowing at the third and fourth lumbar segments which was most  likely to be related to degenerative disk disease, but that does not appear  to be her primary complaint.  She does indeed have some left thigh and leg  pain.  No groin pain and there are some degenerative type symptoms related  to her lower back.   REVIEW OF SYSTEMS:  She only reports arthritis and diabetes.   SOCIAL HISTORY:  She does not drink or smoke.   FAMILY PHYSICIAN:  Her family physician is Dr. Juanetta Gosling.   She takes a medication for diabetes and has diabetes.  She is a poor  historian.   PHYSICAL EXAMINATION:  GENERAL:  She is well developed and nourished,  grooming and hygiene are normal.  She uses a cane.  She has a medium frame,  medium build, medium height.  EXTREMITIES:  Her pulses are normal and intact and palpable.  She has no  major varicose veins or swelling.  Extremities are warm to touch.  No edema.  No tenderness.  LYMPH NODES:  Cervical spine - normal.  MUSCULOSKELETAL:  The left thigh is nontender.  The left groin is nontender.  She has no pain in the groin with rotation of the hip.  She does have some  deep type pain in the left thigh, external rotation 30 degrees, internal  rotation 40 degrees, flexion 120 degrees, extension 0 degrees.  The left  knee is without effusion, pain or tenderness.  It is stable.  Her right  lower extremity has normal range of motion, strength, stability, and  alignment.  The upper extremities show no evidence of malalignment,  subluxation, loss of muscle tone, or tremor and look okay.  NEURO:  Shows normal coordination, reflexes, and sensation.  PSYCH:  She is oriented to time, person, place.  She has normal mood and  affect.   MEDICAL DECISION MAKING:  Include x-rays of the hip and pelvis, the lumbar  spine, MRI of the pelvis and hips.  She has a  left hip avascular necrosis  with degenerative changes of the hip joint which will require a total hip  replacement.   She will be admitted.  She has given informed consent.  She understands the  risks of the procedure versus the benefits which include but are not limited  to deep vein thrombosis and pulmonary embolus, possible death, possible  anesthetic death, loosening of the prosthesis, infection which would require  revision and possible amputation.   CPT code is [phone], ICD9 code is 715.15 and 733.42.     ___________________________________________                                         Vickki Hearing, M.D.   SEH/MEDQ  D:  12/31/2002  T:  12/31/2002  Job:  [id number]

## 2010-06-25 NOTE — Consult Note (Signed)
NAME:  Melissa [name], Melissa [name]                        ACCOUNT NO.:  [id number]   MEDICAL RECORD NO.:  [id number]                   PATIENT TYPE:  INP   LOCATION:  A332                                 FACILITY:  [facility]   PHYSICIAN:  Edward L. Juanetta Gosling, M.D.             DATE OF BIRTH:  [birth year]   DATE OF CONSULTATION:  01/14/2003  DATE OF DISCHARGE:                                   CONSULTATION   REQUESTING PHYSICIAN:  Vickki Hearing, M.D.   CONSULTING PHYSICIAN:  Oneal Deputy. Juanetta Gosling, M.D.   REASON FOR CONSULT:  Management of diabetes.   HISTORY:  [name] is a 77 year old who had total hip replacement by Dr.  Romeo Apple for avascular necrosis of the left hip.  She does have a long  history of diabetes which has actually been very well controlled.  She had  originally been on insulin but now is on Avandia and Amaryl.  She takes 2 mg  of Amaryl and 4 mg of Avandia.  She also has hypertension and  hyperlipidemia.  Her blood pressure has been managed with Lopressor; her  cholesterol has been managed with Zocor.  She says that she feels great  after her surgery and has no other new complaints.   SOCIAL HISTORY:  She lives at home alone.  She does not smoke, she does not  drink any alcohol.   FAMILY HISTORY:  Positive for diabetes and for hypertension.  She has a son  who is mentally retarded.   PHYSICAL EXAMINATION:  VITAL SIGNS:  Her blood pressure is 126/62, pulse 56,  respirations 20, temperature is 98.9, weight 179.  HEENT:  Her mucous membranes are moist.  Her nose and throat are clear.  NECK:  Supple without masses.  CHEST:  Clear without wheezes, rales, or rhonchi.  HEART:  Regular without murmur, gallop, or rub.  ABDOMEN:  Soft.  No masses are felt.  EXTREMITIES:  Showed no edema.  I did not examine her hip.   ASSESSMENT:  She has diabetes which actually has been extremely well  controlled.   PLAN:  My plan is for no changes in her treatments or medications at this  point.   Will follow her blood sugars and continue to follow with Dr.  Romeo Apple.   Thank you for allowing me to see her with you.     ___________________________________________                                            Oneal Deputy. Juanetta Gosling, M.D.   ELH/MEDQ  D:  01/15/2003  T:  01/15/2003  Job:  [id number]

## 2010-07-13 ENCOUNTER — Other Ambulatory Visit: Payer: Self-pay | Admitting: Anesthesiology

## 2010-07-13 ENCOUNTER — Encounter (HOSPITAL_COMMUNITY): Payer: PRIVATE HEALTH INSURANCE

## 2010-07-13 LAB — BASIC METABOLIC PANEL
CO2: 28 mEq/L (ref 19–32)
Chloride: 103 mEq/L (ref 96–112)
GFR calc Af Amer: >60 mL/min (ref >60)
Potassium: 4.6 mEq/L (ref 3.5–5.1)
Sodium: 141 mEq/L (ref 135–145)

## 2010-07-15 ENCOUNTER — Ambulatory Visit (HOSPITAL_COMMUNITY)
Admission: RE | Admit: 2010-07-15 | Discharge: 2010-07-15 | Disposition: A | Discharge status: Home / Self Care | Payer: PRIVATE HEALTH INSURANCE | Source: Ambulatory Visit | Attending: Ophthalmology | Admitting: Ophthalmology

## 2010-07-15 DIAGNOSIS — H2589 Other age-related cataract: Secondary | ICD-10-CM | POA: Insufficient documentation

## 2010-07-15 DIAGNOSIS — Z01810 Encounter for preprocedural cardiovascular examination: Secondary | ICD-10-CM | POA: Insufficient documentation

## 2010-07-15 DIAGNOSIS — Z79899 Other long term (current) drug therapy: Secondary | ICD-10-CM | POA: Insufficient documentation

## 2010-07-15 DIAGNOSIS — I10 Essential (primary) hypertension: Secondary | ICD-10-CM | POA: Insufficient documentation

## 2010-07-15 DIAGNOSIS — E119 Type 2 diabetes mellitus without complications: Secondary | ICD-10-CM | POA: Insufficient documentation

## 2010-07-15 LAB — GLUCOSE, CAPILLARY: Glucose-Capillary: 104 mg/dL — ABNORMAL HIGH (ref 70–99)

## 2010-07-19 ENCOUNTER — Ambulatory Visit (INDEPENDENT_AMBULATORY_CARE_PROVIDER_SITE_OTHER): Payer: PRIVATE HEALTH INSURANCE | Admitting: *Deleted

## 2010-07-19 DIAGNOSIS — I4891 Unspecified atrial fibrillation: Secondary | ICD-10-CM

## 2010-07-19 DIAGNOSIS — Z7901 Long term (current) use of anticoagulants: Secondary | ICD-10-CM

## 2010-07-19 LAB — POCT INR: INR: 2.5

## 2010-07-19 NOTE — Op Note (Signed)
  Melissa [name], Melissa [name]              ACCOUNT NO.:  [id number]  MEDICAL RECORD NO.:  [id number]  LOCATION:  [facility]                          FACILITY:  [facility]  PHYSICIAN:  Susanne Greenhouse, MD       DATE OF BIRTH:  1931/01/01  DATE OF PROCEDURE:  07/15/2010 DATE OF DISCHARGE:                              OPERATIVE REPORT   PREOPERATIVE DIAGNOSIS:  Combined cataract, right eye.  Diagnosis code 366.19.  POSTOPERATIVE DIAGNOSIS:  Combined cataract, right eye.  Diagnosis code 366.19.  OPERATION PERFORMED:  Phacoemulsification with posterior chamber intraocular lens implantation, right eye.  SURGEON:  Bonne Dolores. Jayel Inks, MD  ANESTHESIA:  General endotracheal anesthesia.  OPERATIVE SUMMARY:  In the preoperative area, dilating drops were placed into the right eye.  The patient was then brought into the operating room where she was placed under general anesthesia.  The eye was then prepped and draped.  Beginning with a 75 blade, a paracentesis port was made at the surgeon's 2 o'clock position.  The anterior chamber was then filled with a 1% nonpreserved lidocaine solution with epinephrine.  This was followed by Viscoat to deepen the chamber.  A small fornix-based peritomy was performed superiorly.  Next, a single iris hook was placed through the limbus superiorly.  A 2.4-mm keratome blade was then used to make a clear corneal incision over the iris hook.  A bent cystotome needle and Utrata forceps were used to create a continuous tear capsulotomy.  Hydrodissection was performed using balanced salt solution on a fine cannula.  The lens nucleus was then removed using phacoemulsification in a quadrant cracking technique.  The cortical material was then removed with irrigation and aspiration.  The capsular bag and anterior chamber were refilled with Provisc.  The wound was widened to approximately 3 mm and a posterior chamber intraocular lens was placed into the capsular bag without difficulty using an  [employer] lens injecting system.  A single 10-0 nylon suture was then used to close the incision as well as stromal hydration.  The Provisc was removed from the anterior chamber and capsular bag with irrigation and aspiration.  At this point, the wounds were tested for leak, which were negative.  The anterior chamber remained deep and stable.  The patient tolerated the procedure well.  There were no operative complications, and she awoke from general anesthesia without problem.  Prosthetic device used is a Lenstec posterior chamber lens model Softec HD, power of 23.75, serial number is [id number].          ______________________________ Susanne Greenhouse, MD     KEH/MEDQ  D:  07/15/2010  T:  07/16/2010  Job:  [id number]  Electronically Signed by Gemma Payor MD on 07/19/2010 12:23:10 PM

## 2010-07-27 ENCOUNTER — Other Ambulatory Visit (HOSPITAL_COMMUNITY): Payer: Medicare Other

## 2010-08-02 ENCOUNTER — Ambulatory Visit (HOSPITAL_COMMUNITY)
Admission: RE | Admit: 2010-08-02 | Discharge: 2010-08-02 | Disposition: A | Discharge status: Home / Self Care | Payer: PRIVATE HEALTH INSURANCE | Source: Ambulatory Visit | Attending: Ophthalmology | Admitting: Ophthalmology

## 2010-08-02 DIAGNOSIS — I10 Essential (primary) hypertension: Secondary | ICD-10-CM | POA: Insufficient documentation

## 2010-08-02 DIAGNOSIS — Z01812 Encounter for preprocedural laboratory examination: Secondary | ICD-10-CM | POA: Insufficient documentation

## 2010-08-02 DIAGNOSIS — H2510 Age-related nuclear cataract, unspecified eye: Secondary | ICD-10-CM | POA: Insufficient documentation

## 2010-08-02 DIAGNOSIS — Z79899 Other long term (current) drug therapy: Secondary | ICD-10-CM | POA: Insufficient documentation

## 2010-08-02 DIAGNOSIS — E119 Type 2 diabetes mellitus without complications: Secondary | ICD-10-CM | POA: Insufficient documentation

## 2010-08-16 ENCOUNTER — Ambulatory Visit (INDEPENDENT_AMBULATORY_CARE_PROVIDER_SITE_OTHER): Payer: PRIVATE HEALTH INSURANCE | Admitting: *Deleted

## 2010-08-16 DIAGNOSIS — Z7901 Long term (current) use of anticoagulants: Secondary | ICD-10-CM

## 2010-08-16 DIAGNOSIS — I4891 Unspecified atrial fibrillation: Secondary | ICD-10-CM

## 2010-08-30 NOTE — Op Note (Signed)
  NAMECHAREE, TUMBLIN             ACCOUNT NO.:  [id number]  MEDICAL RECORD NO.:  [id number]  LOCATION:  [facility]                          FACILITY:  [facility]  PHYSICIAN:  Susanne Greenhouse, MD       DATE OF BIRTH:  Aug 07, 1930  DATE OF PROCEDURE:  08/02/2010 DATE OF DISCHARGE:                              OPERATIVE REPORT   PREOPERATIVE DIAGNOSIS:  Combined cataract, left eye.  POSTOPERATIVE DIAGNOSIS:  Combined cataract, left eye.  DIAGNOSIS CODE:  366.16.  No surgical specimens.  PROSTHETIC DEVICE USED:  A Lenstec posterior chamber lens, model Softec HD, power of 23.25, serial number is [id number].          ______________________________ Susanne Greenhouse, MD     KEH/MEDQ  D:  08/02/2010  T:  08/03/2010  Job:  [id number]  Electronically Signed by Nash Dimmer Lasharon Dunivan MD on 08/30/2010 10:23:51 AM

## 2010-09-04 ENCOUNTER — Other Ambulatory Visit: Payer: Self-pay | Admitting: Cardiology

## 2010-09-06 ENCOUNTER — Ambulatory Visit (INDEPENDENT_AMBULATORY_CARE_PROVIDER_SITE_OTHER): Payer: PRIVATE HEALTH INSURANCE | Admitting: *Deleted

## 2010-09-06 DIAGNOSIS — Z7901 Long term (current) use of anticoagulants: Secondary | ICD-10-CM

## 2010-09-06 DIAGNOSIS — I4891 Unspecified atrial fibrillation: Secondary | ICD-10-CM

## 2010-10-04 ENCOUNTER — Ambulatory Visit (INDEPENDENT_AMBULATORY_CARE_PROVIDER_SITE_OTHER): Payer: PRIVATE HEALTH INSURANCE | Admitting: *Deleted

## 2010-10-04 DIAGNOSIS — I4891 Unspecified atrial fibrillation: Secondary | ICD-10-CM

## 2010-10-04 DIAGNOSIS — Z7901 Long term (current) use of anticoagulants: Secondary | ICD-10-CM

## 2010-10-04 LAB — POCT INR: INR: 2.9

## 2010-10-05 ENCOUNTER — Other Ambulatory Visit: Payer: Self-pay | Admitting: Cardiology

## 2010-10-05 LAB — LIPID PANEL
HDL: 65 mg/dL (ref >39)
Total CHOL/HDL Ratio: 2.4 Ratio
VLDL: 11 mg/dL (ref 0–40)

## 2010-10-05 LAB — HEPATIC FUNCTION PANEL
AST: 25 U/L (ref 0–37)
Albumin: 4.2 g/dL (ref 3.5–5.2)
Total Bilirubin: 0.8 mg/dL (ref 0.3–1.2)

## 2010-11-01 ENCOUNTER — Ambulatory Visit (INDEPENDENT_AMBULATORY_CARE_PROVIDER_SITE_OTHER): Payer: Medicare Other | Admitting: *Deleted

## 2010-11-01 DIAGNOSIS — I4891 Unspecified atrial fibrillation: Secondary | ICD-10-CM

## 2010-11-01 DIAGNOSIS — Z7901 Long term (current) use of anticoagulants: Secondary | ICD-10-CM

## 2010-11-01 LAB — POCT INR: INR: 2.4

## 2010-11-22 ENCOUNTER — Other Ambulatory Visit: Payer: Self-pay | Admitting: Cardiology

## 2010-11-29 ENCOUNTER — Encounter: Payer: Self-pay | Admitting: Cardiology

## 2010-11-29 ENCOUNTER — Ambulatory Visit (INDEPENDENT_AMBULATORY_CARE_PROVIDER_SITE_OTHER): Payer: Medicare Other | Admitting: Cardiology

## 2010-11-29 ENCOUNTER — Ambulatory Visit (INDEPENDENT_AMBULATORY_CARE_PROVIDER_SITE_OTHER): Payer: Medicare Other | Admitting: *Deleted

## 2010-11-29 VITALS — BP 149/93 | HR 79 | Resp 18 | Ht 67.0 in | Wt 175.0 lb

## 2010-11-29 DIAGNOSIS — E782 Mixed hyperlipidemia: Secondary | ICD-10-CM

## 2010-11-29 DIAGNOSIS — I10 Essential (primary) hypertension: Secondary | ICD-10-CM

## 2010-11-29 DIAGNOSIS — I4891 Unspecified atrial fibrillation: Secondary | ICD-10-CM

## 2010-11-29 DIAGNOSIS — Z7901 Long term (current) use of anticoagulants: Secondary | ICD-10-CM

## 2010-11-29 LAB — POCT INR: INR: 2.8

## 2010-11-29 NOTE — Assessment & Plan Note (Signed)
Lipid numbers have improved. Continue current dose of Lipitor.

## 2010-11-29 NOTE — Progress Notes (Signed)
Clinical Summary [name] is a 77 y.o.female presenting for followup. She was seen in March of this year. She reports no problems with palpitations or progressive shortness of breath. Remains functional in her activities of daily living including housework and yardwork.  Lab work from August showed cholesterol 159, triglycerides 55, HDL 65, LDL 83, AST and ALT normal. We discussed these today.  She reports no bleeding problems on Coumadin.   No Known Allergies  Medication list reviewed.  Past Medical History  Diagnosis Date  . Essential hypertension, benign   . Atrial fibrillation   . Type 2 diabetes mellitus   . Mixed hyperlipidemia     Past Surgical History  Procedure Date  . Total hip arthroplasty 01/14/03    Left    Family History  Problem Relation Age of Onset  . Diabetes type II Mother   . Diabetes type II Father   . Hypertension Father     Social History [name] reports that she has never smoked. She has never used smokeless tobacco. [name] reports that she does not drink alcohol.  Review of Systems Otherwise reviewed and negative.  Physical Examination Filed Vitals:   11/29/10 1345  BP: 149/93  Pulse: 79  Resp: 18    Overweight woman in no acute distress.  HEENT: Conjunctiva and lids normal, oropharynx with moist mucosa.  Neck: Supple, no carotid bruits or thyromegaly.  Lungs: Clear with diminished breath sounds, nonlabored.  Cardiac: Irregularly irregular, no S3.  Abdomen: Soft, nontender, bowel sounds present.  Extremities: No pitting edema. Distal pulses one plus.  Skin: Warm and dry.  Musculoskeletal: No kyphosis.  Neuropsychiatric: Alert and oriented x3, affect appropriate.    Problem List and Plan

## 2010-11-29 NOTE — Assessment & Plan Note (Signed)
Continue present regimen including Coumadin and Coreg. Patient is essentially asymptomatic.

## 2010-11-29 NOTE — Assessment & Plan Note (Signed)
Blood pressure is up today. Continue followup with Dr. Juanetta Gosling.

## 2010-11-29 NOTE — Patient Instructions (Signed)
**Note De-identified  Obfuscation** Your physician recommends that you continue on your current medications as directed. Please refer to the Current Medication list given to you today.  Your physician recommends that you return for lab work in: 6 months, just before next office visit.  Your physician recommends that you schedule a follow-up appointment in: 6 months   

## 2011-01-10 ENCOUNTER — Ambulatory Visit (INDEPENDENT_AMBULATORY_CARE_PROVIDER_SITE_OTHER): Payer: Medicare Other | Admitting: *Deleted

## 2011-01-10 DIAGNOSIS — I4891 Unspecified atrial fibrillation: Secondary | ICD-10-CM

## 2011-01-10 DIAGNOSIS — Z7901 Long term (current) use of anticoagulants: Secondary | ICD-10-CM

## 2011-01-10 LAB — POCT INR: INR: 3.9

## 2011-01-26 ENCOUNTER — Other Ambulatory Visit: Payer: Self-pay | Admitting: Cardiology

## 2011-02-07 ENCOUNTER — Ambulatory Visit (INDEPENDENT_AMBULATORY_CARE_PROVIDER_SITE_OTHER): Payer: Medicare Other | Admitting: *Deleted

## 2011-02-07 DIAGNOSIS — Z7901 Long term (current) use of anticoagulants: Secondary | ICD-10-CM

## 2011-02-07 DIAGNOSIS — I4891 Unspecified atrial fibrillation: Secondary | ICD-10-CM

## 2011-03-07 ENCOUNTER — Ambulatory Visit (INDEPENDENT_AMBULATORY_CARE_PROVIDER_SITE_OTHER): Payer: Medicare Other | Admitting: *Deleted

## 2011-03-07 DIAGNOSIS — I4891 Unspecified atrial fibrillation: Secondary | ICD-10-CM

## 2011-03-07 DIAGNOSIS — Z7901 Long term (current) use of anticoagulants: Secondary | ICD-10-CM

## 2011-03-22 ENCOUNTER — Other Ambulatory Visit: Payer: Self-pay | Admitting: Cardiology

## 2011-04-04 ENCOUNTER — Ambulatory Visit (INDEPENDENT_AMBULATORY_CARE_PROVIDER_SITE_OTHER): Payer: Medicare Other | Admitting: *Deleted

## 2011-04-04 DIAGNOSIS — I4891 Unspecified atrial fibrillation: Secondary | ICD-10-CM

## 2011-04-04 DIAGNOSIS — Z7901 Long term (current) use of anticoagulants: Secondary | ICD-10-CM

## 2011-05-09 ENCOUNTER — Ambulatory Visit (INDEPENDENT_AMBULATORY_CARE_PROVIDER_SITE_OTHER): Payer: Medicare Other | Admitting: *Deleted

## 2011-05-09 DIAGNOSIS — Z7901 Long term (current) use of anticoagulants: Secondary | ICD-10-CM

## 2011-05-09 DIAGNOSIS — I4891 Unspecified atrial fibrillation: Secondary | ICD-10-CM

## 2011-05-09 LAB — POCT INR: INR: 2.7

## 2011-05-10 ENCOUNTER — Other Ambulatory Visit: Payer: Self-pay | Admitting: Cardiology

## 2011-05-11 LAB — HEPATIC FUNCTION PANEL
ALT: 10 U/L (ref 0–35)
AST: 22 U/L (ref 0–37)
Albumin: 4.3 g/dL (ref 3.5–5.2)
Total Bilirubin: 0.8 mg/dL (ref 0.3–1.2)

## 2011-05-11 LAB — LIPID PANEL
Cholesterol: 188 mg/dL (ref 0–200)
HDL: 64 mg/dL (ref >39)
Total CHOL/HDL Ratio: 2.9 Ratio

## 2011-06-14 ENCOUNTER — Telehealth: Payer: Self-pay | Admitting: Cardiology

## 2011-06-14 MED ORDER — WARFARIN SODIUM 5 MG PO TABS: 5.0000 mg | ORAL_TABLET | ORAL | Status: DC

## 2011-06-14 NOTE — Telephone Encounter (Signed)
PT CALLING FOR COUMADIN TO BE CALLED IN TO [facility] SHE IS OUT.

## 2011-06-14 NOTE — Telephone Encounter (Signed)
rx sent into pharmacy, pt notified. 

## 2011-06-20 ENCOUNTER — Ambulatory Visit (INDEPENDENT_AMBULATORY_CARE_PROVIDER_SITE_OTHER): Payer: Medicare Other | Admitting: *Deleted

## 2011-06-20 DIAGNOSIS — Z7901 Long term (current) use of anticoagulants: Secondary | ICD-10-CM

## 2011-06-20 DIAGNOSIS — I4891 Unspecified atrial fibrillation: Secondary | ICD-10-CM

## 2011-06-20 LAB — POCT INR: INR: 2

## 2011-06-22 ENCOUNTER — Ambulatory Visit (INDEPENDENT_AMBULATORY_CARE_PROVIDER_SITE_OTHER): Payer: Medicare Other | Admitting: Cardiology

## 2011-06-22 ENCOUNTER — Encounter: Payer: Self-pay | Admitting: Cardiology

## 2011-06-22 VITALS — BP 151/91 | HR 82 | Resp 16 | Ht 66.0 in | Wt 167.0 lb

## 2011-06-22 DIAGNOSIS — I4891 Unspecified atrial fibrillation: Secondary | ICD-10-CM

## 2011-06-22 DIAGNOSIS — E782 Mixed hyperlipidemia: Secondary | ICD-10-CM

## 2011-06-22 NOTE — Assessment & Plan Note (Signed)
Recent lipid numbers reviewed. I stressed compliance with her Lipitor, did not change the dose.

## 2011-06-22 NOTE — Progress Notes (Signed)
   Clinical Summary [name] is a 78 y.o.female presenting for followup. She was seen in October 2012. She says that she has been feeling quite well, the best that she has in a long time. Remains functional around her house, works outdoors.  Recent lab work from April showed LDL of 111 up from 83, HDL 64, troponins rise 67, total cholesterol 188, normal LFTs. Reviewed these results today. I stressed compliance with her Lipitor, made no changes to the dose.  She reports no bleeding problems on Coumadin.   No Known Allergies  Current Outpatient Prescriptions  Medication Sig Dispense Refill  . atorvastatin (LIPITOR) 20 MG tablet Take 20 mg by mouth daily.        . carvedilol (COREG) 12.5 MG tablet Take 12.5 mg by mouth 2 (two) times daily with meals.        Marland Kitchen glimepiride (AMARYL) 2 MG tablet Take 2 mg by mouth daily.        Marland Kitchen HYDROcodone-acetaminophen (VICODIN) 5-500 MG per tablet Take 1 tablet by mouth as needed. For pain       . lisinopril (PRINIVIL,ZESTRIL) 40 MG tablet Take 40 mg by mouth 2 (two) times daily.        . metFORMIN (GLUCOPHAGE) 500 MG tablet Take 500 mg by mouth 2 (two) times daily with meals.        . NORVASC 10 MG tablet TAKE (1) TABLET BY MOUTH DAILY.  30 each  6  . solifenacin (VESICARE) 5 MG tablet Take 5 mg by mouth daily.      Marland Kitchen warfarin (COUMADIN) 5 MG tablet Take 1 tablet (5 mg total) by mouth as directed.  60 tablet  3    Past Medical History  Diagnosis Date  . Essential hypertension, benign   . Atrial fibrillation   . Type 2 diabetes mellitus   . Mixed hyperlipidemia     Social History [name] reports that she has never smoked. She has never used smokeless tobacco. [name] reports that she does not drink alcohol.  Review of Systems No palpitations or syncope. Stable appetite. No orthopnea or peripheral edema. Uses a cane to ambulate. Otherwise negative.  Physical Examination Filed Vitals:   06/22/11 1439  BP: 151/91  Pulse: 82  Resp:  16   Overweight woman in no acute distress.  HEENT: Conjunctiva and lids normal, oropharynx with moist mucosa.  Neck: Supple, no carotid bruits or thyromegaly.  Lungs: Clear with diminished breath sounds, nonlabored.  Cardiac: Irregularly irregular, no S3.  Abdomen: Soft, nontender, bowel sounds present.  Extremities: No pitting edema. Distal pulses one plus.    Problem List and Plan   ATRIAL FIBRILLATION Symptomatically stable. Continue strategy of heart rate control and anticoagulation.  MIXED HYPERLIPIDEMIA Recent lipid numbers reviewed. I stressed compliance with her Lipitor, did not change the dose.     Jonelle Sidle, M.D., F.A.C.C.

## 2011-06-22 NOTE — Patient Instructions (Signed)
**Note De-identified  Obfuscation** Your physician recommends that you continue on your current medications as directed. Please refer to the Current Medication list given to you today.  Your physician recommends that you schedule a follow-up appointment in: 6 months  

## 2011-06-22 NOTE — Assessment & Plan Note (Signed)
Symptomatically stable. Continue strategy of heart rate control and anticoagulation. 

## 2011-08-01 ENCOUNTER — Ambulatory Visit (INDEPENDENT_AMBULATORY_CARE_PROVIDER_SITE_OTHER): Payer: Medicare Other | Admitting: *Deleted

## 2011-08-01 DIAGNOSIS — Z7901 Long term (current) use of anticoagulants: Secondary | ICD-10-CM

## 2011-08-01 DIAGNOSIS — I4891 Unspecified atrial fibrillation: Secondary | ICD-10-CM

## 2011-09-12 ENCOUNTER — Ambulatory Visit (INDEPENDENT_AMBULATORY_CARE_PROVIDER_SITE_OTHER): Payer: Medicare Other | Admitting: *Deleted

## 2011-09-12 DIAGNOSIS — I4891 Unspecified atrial fibrillation: Secondary | ICD-10-CM

## 2011-09-12 DIAGNOSIS — Z7901 Long term (current) use of anticoagulants: Secondary | ICD-10-CM

## 2011-10-24 ENCOUNTER — Ambulatory Visit (INDEPENDENT_AMBULATORY_CARE_PROVIDER_SITE_OTHER): Payer: Medicare Other | Admitting: *Deleted

## 2011-10-24 DIAGNOSIS — I4891 Unspecified atrial fibrillation: Secondary | ICD-10-CM

## 2011-10-24 DIAGNOSIS — Z7901 Long term (current) use of anticoagulants: Secondary | ICD-10-CM

## 2011-10-26 ENCOUNTER — Other Ambulatory Visit: Payer: Self-pay | Admitting: Cardiology

## 2011-12-05 ENCOUNTER — Ambulatory Visit (INDEPENDENT_AMBULATORY_CARE_PROVIDER_SITE_OTHER): Payer: Medicare Other | Admitting: *Deleted

## 2011-12-05 DIAGNOSIS — I4891 Unspecified atrial fibrillation: Secondary | ICD-10-CM

## 2011-12-05 DIAGNOSIS — Z7901 Long term (current) use of anticoagulants: Secondary | ICD-10-CM

## 2012-01-02 ENCOUNTER — Ambulatory Visit (INDEPENDENT_AMBULATORY_CARE_PROVIDER_SITE_OTHER): Payer: Medicare Other | Admitting: *Deleted

## 2012-01-02 DIAGNOSIS — Z7901 Long term (current) use of anticoagulants: Secondary | ICD-10-CM

## 2012-01-02 DIAGNOSIS — I4891 Unspecified atrial fibrillation: Secondary | ICD-10-CM

## 2012-02-06 ENCOUNTER — Ambulatory Visit (INDEPENDENT_AMBULATORY_CARE_PROVIDER_SITE_OTHER): Payer: Medicare Other | Admitting: *Deleted

## 2012-02-06 DIAGNOSIS — Z7901 Long term (current) use of anticoagulants: Secondary | ICD-10-CM

## 2012-02-06 DIAGNOSIS — I4891 Unspecified atrial fibrillation: Secondary | ICD-10-CM

## 2012-02-13 ENCOUNTER — Encounter: Payer: Self-pay | Admitting: Cardiology

## 2012-02-13 ENCOUNTER — Ambulatory Visit (INDEPENDENT_AMBULATORY_CARE_PROVIDER_SITE_OTHER): Payer: Medicare Other | Admitting: Cardiology

## 2012-02-13 VITALS — BP 147/88 | HR 82 | Ht 66.0 in | Wt 165.0 lb

## 2012-02-13 DIAGNOSIS — I4891 Unspecified atrial fibrillation: Secondary | ICD-10-CM

## 2012-02-13 DIAGNOSIS — I10 Essential (primary) hypertension: Secondary | ICD-10-CM

## 2012-02-13 NOTE — Assessment & Plan Note (Signed)
Permanent, continue strategy of heart rate control and anticoagulation. She has been symptomatically stable. ECG reviewed.

## 2012-02-13 NOTE — Progress Notes (Signed)
   Clinical Summary Ms. Valentine is a 79 y.o.female presenting for followup. She was seen in May 2013. She reports no palpitations, NYHA class 2-3 dyspnea on exertion which is chronic. No chest pain symptoms. She states that she lives by herself, feels relatively safe. No bleeding problems or falls.  ECG today shows atrial fibrillation with NSST changes.   No Known Allergies  Current Outpatient Prescriptions  Medication Sig Dispense Refill  . ALPRAZolam (XANAX) 0.25 MG tablet Take 0.25 mg by mouth 3 (three) times daily as needed.      Marland Kitchen atorvastatin (LIPITOR) 20 MG tablet Take 20 mg by mouth daily.        . carvedilol (COREG) 12.5 MG tablet Take 12.5 mg by mouth 2 (two) times daily with meals.        Marland Kitchen glimepiride (AMARYL) 2 MG tablet Take 2 mg by mouth daily.        Marland Kitchen HYDROcodone-acetaminophen (NORCO/VICODIN) 5-325 MG per tablet Take 1 tablet by mouth every 6 (six) hours as needed.      Marland Kitchen lisinopril (PRINIVIL,ZESTRIL) 40 MG tablet Take 40 mg by mouth 2 (two) times daily.        . metFORMIN (GLUCOPHAGE) 500 MG tablet Take 500 mg by mouth 2 (two) times daily with meals.        . NORVASC 10 MG tablet TAKE (1) TABLET BY MOUTH DAILY.  30 tablet  3  . travoprost, benzalkonium, (TRAVATAN) 0.004 % ophthalmic solution Place 1 drop into both eyes at bedtime.      Marland Kitchen warfarin (COUMADIN) 5 MG tablet Take 5 mg by mouth as directed. 08/01/11  Pt is taking coumadin 5mg  daily except 10mg  on Sundays and Thursdays      . solifenacin (VESICARE) 5 MG tablet Take 5 mg by mouth daily.        Past Medical History  Diagnosis Date  . Essential hypertension, benign   . Atrial fibrillation   . Type 2 diabetes mellitus   . Mixed hyperlipidemia     Social History Ms. Perezperez reports that she has never smoked. She has never used smokeless tobacco. Ms. Mattila reports that she does not drink alcohol.  Review of Systems Stable appetite, no melena or hematochezia. Otherwise negative.  Physical  Examination Filed Vitals:   02/13/12 1351  BP: 147/88  Pulse: 82   Filed Weights   02/13/12 1351  Weight: 165 lb (74.844 kg)    Overweight woman in no acute distress.  HEENT: Conjunctiva and lids normal, oropharynx with moist mucosa.  Neck: Supple, no carotid bruits or thyromegaly.  Lungs: Clear with diminished breath sounds, nonlabored.  Cardiac: Irregularly irregular, no S3.  Abdomen: Soft, nontender, bowel sounds present.  Extremities: No pitting edema. Distal pulses one plus.    Problem List and Plan   ATRIAL FIBRILLATION Permanent, continue strategy of heart rate control and anticoagulation. She has been symptomatically stable. ECG reviewed.  ESSENTIAL HYPERTENSION, BENIGN Keep follow up with Dr. Juanetta Gosling. Blood pressure elevated today.    Jonelle Sidle, M.D., F.A.C.C.

## 2012-02-13 NOTE — Assessment & Plan Note (Signed)
Keep follow up with Dr. Juanetta Gosling. Blood pressure elevated today.

## 2012-02-13 NOTE — Patient Instructions (Addendum)
Your physician recommends that you schedule a follow-up appointment in: 6 MONTHS WITH SM 

## 2012-02-23 ENCOUNTER — Other Ambulatory Visit: Payer: Self-pay | Admitting: Cardiology

## 2012-02-29 ENCOUNTER — Other Ambulatory Visit: Payer: Self-pay | Admitting: Cardiology

## 2012-03-05 ENCOUNTER — Ambulatory Visit (INDEPENDENT_AMBULATORY_CARE_PROVIDER_SITE_OTHER): Payer: Medicare Other | Admitting: *Deleted

## 2012-03-05 DIAGNOSIS — I4891 Unspecified atrial fibrillation: Secondary | ICD-10-CM

## 2012-03-05 DIAGNOSIS — Z7901 Long term (current) use of anticoagulants: Secondary | ICD-10-CM

## 2012-03-05 LAB — POCT INR: INR: 2.4

## 2012-04-16 ENCOUNTER — Ambulatory Visit (INDEPENDENT_AMBULATORY_CARE_PROVIDER_SITE_OTHER): Payer: Medicare Other | Admitting: *Deleted

## 2012-04-16 DIAGNOSIS — I4891 Unspecified atrial fibrillation: Secondary | ICD-10-CM

## 2012-05-28 ENCOUNTER — Ambulatory Visit (INDEPENDENT_AMBULATORY_CARE_PROVIDER_SITE_OTHER): Payer: Medicare Other | Admitting: *Deleted

## 2012-05-28 ENCOUNTER — Encounter: Payer: Self-pay | Admitting: *Deleted

## 2012-05-28 DIAGNOSIS — Z7901 Long term (current) use of anticoagulants: Secondary | ICD-10-CM

## 2012-05-28 DIAGNOSIS — I4891 Unspecified atrial fibrillation: Secondary | ICD-10-CM

## 2012-05-28 LAB — POCT INR: INR: 3.9

## 2012-06-15 ENCOUNTER — Other Ambulatory Visit: Payer: Self-pay | Admitting: Cardiology

## 2012-06-18 ENCOUNTER — Ambulatory Visit (INDEPENDENT_AMBULATORY_CARE_PROVIDER_SITE_OTHER): Payer: Medicare Other | Admitting: *Deleted

## 2012-06-18 DIAGNOSIS — I4891 Unspecified atrial fibrillation: Secondary | ICD-10-CM

## 2012-06-18 DIAGNOSIS — Z7901 Long term (current) use of anticoagulants: Secondary | ICD-10-CM

## 2012-06-19 MED ORDER — WARFARIN SODIUM 5 MG PO TABS: ORAL_TABLET | ORAL | Status: DC

## 2012-06-19 NOTE — Telephone Encounter (Signed)
Please refill.

## 2012-07-09 ENCOUNTER — Ambulatory Visit (INDEPENDENT_AMBULATORY_CARE_PROVIDER_SITE_OTHER): Payer: Medicare Other | Admitting: *Deleted

## 2012-07-09 DIAGNOSIS — Z7901 Long term (current) use of anticoagulants: Secondary | ICD-10-CM

## 2012-07-09 DIAGNOSIS — I4891 Unspecified atrial fibrillation: Secondary | ICD-10-CM

## 2012-08-06 ENCOUNTER — Ambulatory Visit (INDEPENDENT_AMBULATORY_CARE_PROVIDER_SITE_OTHER): Payer: Medicare Other | Admitting: *Deleted

## 2012-08-06 DIAGNOSIS — I4891 Unspecified atrial fibrillation: Secondary | ICD-10-CM

## 2012-08-06 DIAGNOSIS — Z7901 Long term (current) use of anticoagulants: Secondary | ICD-10-CM

## 2012-08-06 LAB — POCT INR: INR: 3

## 2012-08-15 ENCOUNTER — Encounter: Payer: Self-pay | Admitting: Cardiology

## 2012-08-15 ENCOUNTER — Ambulatory Visit (INDEPENDENT_AMBULATORY_CARE_PROVIDER_SITE_OTHER): Payer: PRIVATE HEALTH INSURANCE | Admitting: Cardiology

## 2012-08-15 VITALS — BP 158/84 | HR 77 | Ht 66.0 in | Wt 161.8 lb

## 2012-08-15 DIAGNOSIS — I4891 Unspecified atrial fibrillation: Secondary | ICD-10-CM

## 2012-08-15 DIAGNOSIS — I10 Essential (primary) hypertension: Secondary | ICD-10-CM

## 2012-08-15 NOTE — Progress Notes (Signed)
   Clinical Summary Melissa [name] is a 79 y.o.female last seen in January. She states that she has been feeling very well. Walking regularly in the mornings now, reports improved hip pain, no significant palpitations or unusual breathlessness.  ECG today shows atrial fibrillation with controlled ventricular response and nonspecific T-wave changes. She reports compliance with Coumadin, no significant bleeding problems.   No Known Allergies  Current Outpatient Prescriptions  Medication Sig Dispense Refill  . ALPRAZolam (XANAX) 0.25 MG tablet Take 0.25 mg by mouth 3 (three) times daily as needed.      Marland Kitchen amLODipine (NORVASC) 10 MG tablet TAKE (1) TABLET BY MOUTH DAILY.  30 tablet  5  . atorvastatin (LIPITOR) 20 MG tablet Take 20 mg by mouth daily.        . carvedilol (COREG) 12.5 MG tablet Take 12.5 mg by mouth 2 (two) times daily with meals.        Marland Kitchen glimepiride (AMARYL) 2 MG tablet Take 2 mg by mouth daily.        Marland Kitchen HYDROcodone-acetaminophen (NORCO/VICODIN) 5-325 MG per tablet Take 1 tablet by mouth 4 (four) times daily.       Marland Kitchen lisinopril (PRINIVIL,ZESTRIL) 40 MG tablet Take 40 mg by mouth 2 (two) times daily.        . metFORMIN (GLUCOPHAGE) 500 MG tablet Take 500 mg by mouth 2 (two) times daily with meals.        . travoprost, benzalkonium, (TRAVATAN) 0.004 % ophthalmic solution Place 1 drop into both eyes at bedtime.      Marland Kitchen warfarin (COUMADIN) 5 MG tablet TAKE 1 TABLET BY MOUTH ONCE DAILY AND 2 TABLETS ON SUNDAY AND THURSDAY.  45 tablet  3  . solifenacin (VESICARE) 5 MG tablet Take 5 mg by mouth daily.       No current facility-administered medications for this visit.    Past Medical History  Diagnosis Date  . Essential hypertension, benign   . Atrial fibrillation   . Type 2 diabetes mellitus   . Mixed hyperlipidemia     Social History Melissa [name] reports that she has never smoked. She has never used smokeless tobacco. Melissa [name] reports that she does not drink  alcohol.  Review of Systems Hard of hearing. Stable appetite. Otherwise negative.  Physical Examination Filed Vitals:   08/15/12 1522  BP: 158/84  Pulse: 77   Filed Weights   08/15/12 1522  Weight: 161 lb 12.8 oz (73.392 kg)    Comfortable at rest.  HEENT: Conjunctiva and lids normal, oropharynx with moist mucosa.  Neck: Supple, no carotid bruits or thyromegaly.  Lungs: Clear with diminished breath sounds, nonlabored.  Cardiac: Irregularly irregular, no S3.  Abdomen: Soft, nontender, bowel sounds present.  Extremities: No pitting edema. Distal pulses one plus.    Problem List and Plan   ATRIAL FIBRILLATION Permanent, continue strategy of heart rate control and anticoagulation.  Essential hypertension, benign Her blood pressure is elevated today. I recommended continued follow up with Melissa [name].    Melissa [name], M.D., F.A.C.C.

## 2012-08-15 NOTE — Assessment & Plan Note (Signed)
Her blood pressure is elevated today. I recommended continued follow up with Dr. Juanetta Gosling.

## 2012-08-15 NOTE — Assessment & Plan Note (Signed)
Permanent, continue strategy of heart rate control and anticoagulation. 

## 2012-08-15 NOTE — Patient Instructions (Addendum)
Your physician recommends that you schedule a follow-up appointment in: 6 MONTHS WITH SM 

## 2012-08-27 ENCOUNTER — Other Ambulatory Visit: Payer: Self-pay | Admitting: Cardiology

## 2012-08-27 NOTE — Telephone Encounter (Signed)
Medication sent via escribe for amLODipine (NORVASC) 10 MG tablet.  

## 2012-09-03 ENCOUNTER — Ambulatory Visit (INDEPENDENT_AMBULATORY_CARE_PROVIDER_SITE_OTHER): Payer: PRIVATE HEALTH INSURANCE | Admitting: *Deleted

## 2012-09-03 DIAGNOSIS — I4891 Unspecified atrial fibrillation: Secondary | ICD-10-CM

## 2012-09-03 DIAGNOSIS — Z7901 Long term (current) use of anticoagulants: Secondary | ICD-10-CM

## 2012-09-24 ENCOUNTER — Ambulatory Visit (INDEPENDENT_AMBULATORY_CARE_PROVIDER_SITE_OTHER): Payer: PRIVATE HEALTH INSURANCE | Admitting: *Deleted

## 2012-09-24 DIAGNOSIS — Z7901 Long term (current) use of anticoagulants: Secondary | ICD-10-CM

## 2012-09-24 DIAGNOSIS — I4891 Unspecified atrial fibrillation: Secondary | ICD-10-CM

## 2012-09-24 LAB — POCT INR: INR: 3.7

## 2012-10-15 ENCOUNTER — Ambulatory Visit (INDEPENDENT_AMBULATORY_CARE_PROVIDER_SITE_OTHER): Payer: PRIVATE HEALTH INSURANCE | Admitting: *Deleted

## 2012-10-15 DIAGNOSIS — Z7901 Long term (current) use of anticoagulants: Secondary | ICD-10-CM

## 2012-10-15 DIAGNOSIS — I4891 Unspecified atrial fibrillation: Secondary | ICD-10-CM

## 2012-10-15 LAB — POCT INR: INR: 2.3

## 2012-11-12 ENCOUNTER — Ambulatory Visit (INDEPENDENT_AMBULATORY_CARE_PROVIDER_SITE_OTHER): Payer: PRIVATE HEALTH INSURANCE | Admitting: *Deleted

## 2012-11-12 DIAGNOSIS — I4891 Unspecified atrial fibrillation: Secondary | ICD-10-CM

## 2012-11-12 DIAGNOSIS — Z7901 Long term (current) use of anticoagulants: Secondary | ICD-10-CM

## 2012-11-12 LAB — POCT INR: INR: 2.1

## 2012-11-28 ENCOUNTER — Other Ambulatory Visit: Payer: Self-pay | Admitting: Cardiology

## 2012-12-10 ENCOUNTER — Ambulatory Visit (INDEPENDENT_AMBULATORY_CARE_PROVIDER_SITE_OTHER): Payer: PRIVATE HEALTH INSURANCE | Admitting: *Deleted

## 2012-12-10 DIAGNOSIS — I4891 Unspecified atrial fibrillation: Secondary | ICD-10-CM

## 2012-12-10 DIAGNOSIS — Z7901 Long term (current) use of anticoagulants: Secondary | ICD-10-CM

## 2013-01-07 ENCOUNTER — Ambulatory Visit (INDEPENDENT_AMBULATORY_CARE_PROVIDER_SITE_OTHER): Payer: PRIVATE HEALTH INSURANCE | Admitting: *Deleted

## 2013-01-07 DIAGNOSIS — I4891 Unspecified atrial fibrillation: Secondary | ICD-10-CM

## 2013-01-07 DIAGNOSIS — Z7901 Long term (current) use of anticoagulants: Secondary | ICD-10-CM

## 2013-02-11 ENCOUNTER — Ambulatory Visit (INDEPENDENT_AMBULATORY_CARE_PROVIDER_SITE_OTHER): Payer: Managed Care, Other (non HMO) | Admitting: *Deleted

## 2013-02-11 DIAGNOSIS — Z7901 Long term (current) use of anticoagulants: Secondary | ICD-10-CM

## 2013-02-11 DIAGNOSIS — I4891 Unspecified atrial fibrillation: Secondary | ICD-10-CM

## 2013-02-11 LAB — POCT INR: INR: 2.8

## 2013-03-11 ENCOUNTER — Ambulatory Visit (INDEPENDENT_AMBULATORY_CARE_PROVIDER_SITE_OTHER): Payer: Managed Care, Other (non HMO) | Admitting: *Deleted

## 2013-03-11 DIAGNOSIS — Z5181 Encounter for therapeutic drug level monitoring: Secondary | ICD-10-CM

## 2013-03-11 DIAGNOSIS — I4891 Unspecified atrial fibrillation: Secondary | ICD-10-CM

## 2013-03-11 DIAGNOSIS — Z7901 Long term (current) use of anticoagulants: Secondary | ICD-10-CM

## 2013-03-11 LAB — POCT INR: INR: 3.4

## 2013-04-02 ENCOUNTER — Other Ambulatory Visit: Payer: Self-pay | Admitting: Cardiology

## 2013-04-08 ENCOUNTER — Ambulatory Visit (INDEPENDENT_AMBULATORY_CARE_PROVIDER_SITE_OTHER): Payer: Managed Care, Other (non HMO) | Admitting: *Deleted

## 2013-04-08 DIAGNOSIS — Z5181 Encounter for therapeutic drug level monitoring: Secondary | ICD-10-CM

## 2013-04-08 DIAGNOSIS — Z7901 Long term (current) use of anticoagulants: Secondary | ICD-10-CM

## 2013-04-08 DIAGNOSIS — I4891 Unspecified atrial fibrillation: Secondary | ICD-10-CM

## 2013-04-08 LAB — POCT INR: INR: 2.2

## 2013-04-09 ENCOUNTER — Other Ambulatory Visit: Payer: Self-pay | Admitting: Cardiovascular Disease

## 2013-05-03 ENCOUNTER — Ambulatory Visit (INDEPENDENT_AMBULATORY_CARE_PROVIDER_SITE_OTHER): Payer: Managed Care, Other (non HMO) | Admitting: Cardiology

## 2013-05-03 ENCOUNTER — Encounter: Payer: Self-pay | Admitting: Cardiology

## 2013-05-03 VITALS — BP 163/80 | HR 77 | Ht 66.0 in | Wt 157.0 lb

## 2013-05-03 DIAGNOSIS — I10 Essential (primary) hypertension: Secondary | ICD-10-CM

## 2013-05-03 DIAGNOSIS — I4891 Unspecified atrial fibrillation: Secondary | ICD-10-CM

## 2013-05-03 DIAGNOSIS — E782 Mixed hyperlipidemia: Secondary | ICD-10-CM

## 2013-05-03 NOTE — Progress Notes (Signed)
    Clinical Summary [name] is an 80 y.o.female last seen in July 2014. She reports no specific complaints today. Denied palpitations or chest pain. States that she has had no bleeding problems on Coumadin. No falls.  Last INR was 2.2 in early March, she continues to follow through the Coumadin clinic.  She continues to see Dr. [name] approximately every 3 months.   No Known Allergies  Current Outpatient Prescriptions  Medication Sig Dispense Refill  . amLODipine (NORVASC) 10 MG tablet TAKE (1) TABLET BY MOUTH DAILY.  30 tablet  3  . atorvastatin (LIPITOR) 20 MG tablet Take 20 mg by mouth daily.        . carvedilol (COREG) 12.5 MG tablet Take 12.5 mg by mouth 2 (two) times daily with meals.        Marland Kitchen. glimepiride (AMARYL) 2 MG tablet Take 2 mg by mouth daily.        Marland Kitchen. lisinopril (PRINIVIL,ZESTRIL) 40 MG tablet Take 40 mg by mouth 2 (two) times daily.        . metFORMIN (GLUCOPHAGE) 500 MG tablet Take 500 mg by mouth 2 (two) times daily with meals.        . solifenacin (VESICARE) 5 MG tablet Take 5 mg by mouth daily.      . travoprost, benzalkonium, (TRAVATAN) 0.004 % ophthalmic solution Place 1 drop into both eyes at bedtime.      Marland Kitchen. warfarin (COUMADIN) 5 MG tablet TAKE ONE TABLET BY MOUTH DAILY.  30 tablet  2   No current facility-administered medications for this visit.    Past Medical History  Diagnosis Date  . Essential hypertension, benign   . Atrial fibrillation   . Type 2 diabetes mellitus   . Mixed hyperlipidemia     Social History Melissa [name] reports that she has never smoked. She has never used smokeless tobacco. [name] reports that she does not drink alcohol.  Review of Systems Hard of hearing. Negative except as outlined.  Physical Examination Filed Vitals:   05/03/13 1321  BP: 163/80  Pulse: 77   Filed Weights   05/03/13 1321  Weight: 157 lb (71.215 kg)    Comfortable at rest.  HEENT: Conjunctiva and lids normal, oropharynx with moist  mucosa.  Neck: Supple, no carotid bruits or thyromegaly.  Lungs: Clear with diminished breath sounds, nonlabored.  Cardiac: Irregularly irregular, no S3.  Abdomen: Soft, nontender, bowel sounds present.  Extremities: No pitting edema. Distal pulses one plus.    Problem List and Plan   Atrial fibrillation Permanent, continue strategy of heart rate control and anticoagulation.  Mixed hyperlipidemia She continues on Lipitor. Keep followup with Dr. [name].  Essential hypertension, benign Blood pressure is elevated today. She reports compliance with her medications. Keep follow with Dr. [name].    Jonelle SidleSamuel G. Caliana Spires, M.D., F.A.C.C.

## 2013-05-03 NOTE — Assessment & Plan Note (Signed)
Blood pressure is elevated today. She reports compliance with her medications. Keep follow with Dr. [name].

## 2013-05-03 NOTE — Assessment & Plan Note (Signed)
Permanent, continue strategy of heart rate control and anticoagulation. 

## 2013-05-03 NOTE — Patient Instructions (Signed)
Your physician recommends that you schedule a follow-up appointment in: 6 months with Dr McDowell You will receive a reminder letter two months in advance reminding you to call and schedule your appointment. If you don't receive this letter, please contact our office.  Your physician recommends that you continue on your current medications as directed. Please refer to the Current Medication list given to you today.   

## 2013-05-03 NOTE — Assessment & Plan Note (Signed)
She continues on Lipitor. Keep followup with Dr. [name].

## 2013-05-06 ENCOUNTER — Ambulatory Visit (INDEPENDENT_AMBULATORY_CARE_PROVIDER_SITE_OTHER): Payer: Managed Care, Other (non HMO) | Admitting: *Deleted

## 2013-05-06 DIAGNOSIS — Z7901 Long term (current) use of anticoagulants: Secondary | ICD-10-CM

## 2013-05-06 DIAGNOSIS — I4891 Unspecified atrial fibrillation: Secondary | ICD-10-CM

## 2013-05-06 DIAGNOSIS — Z5181 Encounter for therapeutic drug level monitoring: Secondary | ICD-10-CM

## 2013-05-06 LAB — POCT INR: INR: 2.3

## 2013-06-03 ENCOUNTER — Ambulatory Visit (INDEPENDENT_AMBULATORY_CARE_PROVIDER_SITE_OTHER): Payer: Managed Care, Other (non HMO) | Admitting: *Deleted

## 2013-06-03 DIAGNOSIS — Z7901 Long term (current) use of anticoagulants: Secondary | ICD-10-CM

## 2013-06-03 DIAGNOSIS — Z5181 Encounter for therapeutic drug level monitoring: Secondary | ICD-10-CM

## 2013-06-03 DIAGNOSIS — I4891 Unspecified atrial fibrillation: Secondary | ICD-10-CM

## 2013-06-03 LAB — POCT INR: INR: 1.8

## 2013-06-24 ENCOUNTER — Ambulatory Visit (INDEPENDENT_AMBULATORY_CARE_PROVIDER_SITE_OTHER): Payer: Managed Care, Other (non HMO) | Admitting: *Deleted

## 2013-06-24 DIAGNOSIS — Z7901 Long term (current) use of anticoagulants: Secondary | ICD-10-CM

## 2013-06-24 DIAGNOSIS — Z5181 Encounter for therapeutic drug level monitoring: Secondary | ICD-10-CM

## 2013-06-24 DIAGNOSIS — I4891 Unspecified atrial fibrillation: Secondary | ICD-10-CM

## 2013-06-24 LAB — POCT INR: INR: 1.8

## 2013-06-27 ENCOUNTER — Other Ambulatory Visit: Payer: Self-pay | Admitting: Cardiology

## 2013-07-10 ENCOUNTER — Ambulatory Visit (INDEPENDENT_AMBULATORY_CARE_PROVIDER_SITE_OTHER): Payer: Managed Care, Other (non HMO) | Admitting: *Deleted

## 2013-07-10 DIAGNOSIS — I4891 Unspecified atrial fibrillation: Secondary | ICD-10-CM

## 2013-07-10 DIAGNOSIS — Z5181 Encounter for therapeutic drug level monitoring: Secondary | ICD-10-CM

## 2013-07-10 DIAGNOSIS — Z7901 Long term (current) use of anticoagulants: Secondary | ICD-10-CM

## 2013-07-10 LAB — POCT INR: INR: 2.9

## 2013-08-07 ENCOUNTER — Ambulatory Visit (INDEPENDENT_AMBULATORY_CARE_PROVIDER_SITE_OTHER): Payer: Managed Care, Other (non HMO) | Admitting: *Deleted

## 2013-08-07 DIAGNOSIS — Z7901 Long term (current) use of anticoagulants: Secondary | ICD-10-CM

## 2013-08-07 DIAGNOSIS — Z5181 Encounter for therapeutic drug level monitoring: Secondary | ICD-10-CM

## 2013-08-07 DIAGNOSIS — I4891 Unspecified atrial fibrillation: Secondary | ICD-10-CM

## 2013-08-07 LAB — POCT INR: INR: 3

## 2013-08-12 ENCOUNTER — Telehealth: Payer: Self-pay | Admitting: Cardiology

## 2013-08-12 MED ORDER — AMLODIPINE BESYLATE 10 MG PO TABS: 10.0000 mg | ORAL_TABLET | Freq: Every day | ORAL | Status: DC

## 2013-08-12 NOTE — Telephone Encounter (Signed)
refill request complete 

## 2013-08-12 NOTE — Telephone Encounter (Signed)
Received fax refill request  Rx # [id number] Medication:  Amlodipine Besylate 10 mg Qty 30 Sig:  Take one tablet by mouth daily Physician:  mcDowell

## 2013-09-04 ENCOUNTER — Ambulatory Visit (INDEPENDENT_AMBULATORY_CARE_PROVIDER_SITE_OTHER): Payer: Managed Care, Other (non HMO) | Admitting: *Deleted

## 2013-09-04 DIAGNOSIS — Z5181 Encounter for therapeutic drug level monitoring: Secondary | ICD-10-CM

## 2013-09-04 DIAGNOSIS — Z7901 Long term (current) use of anticoagulants: Secondary | ICD-10-CM

## 2013-09-04 DIAGNOSIS — I4891 Unspecified atrial fibrillation: Secondary | ICD-10-CM

## 2013-09-04 LAB — POCT INR: INR: 2.5

## 2013-10-16 ENCOUNTER — Ambulatory Visit (INDEPENDENT_AMBULATORY_CARE_PROVIDER_SITE_OTHER): Payer: Managed Care, Other (non HMO) | Admitting: *Deleted

## 2013-10-16 DIAGNOSIS — Z7901 Long term (current) use of anticoagulants: Secondary | ICD-10-CM

## 2013-10-16 DIAGNOSIS — I4891 Unspecified atrial fibrillation: Secondary | ICD-10-CM

## 2013-10-16 DIAGNOSIS — Z5181 Encounter for therapeutic drug level monitoring: Secondary | ICD-10-CM

## 2013-10-16 LAB — POCT INR: INR: 2.6

## 2013-11-11 ENCOUNTER — Ambulatory Visit (INDEPENDENT_AMBULATORY_CARE_PROVIDER_SITE_OTHER): Payer: Managed Care, Other (non HMO) | Admitting: Cardiology

## 2013-11-11 ENCOUNTER — Encounter: Payer: Self-pay | Admitting: Cardiology

## 2013-11-11 VITALS — BP 140/72 | HR 83 | Ht 66.0 in | Wt 153.0 lb

## 2013-11-11 DIAGNOSIS — I10 Essential (primary) hypertension: Secondary | ICD-10-CM

## 2013-11-11 DIAGNOSIS — I482 Chronic atrial fibrillation, unspecified: Secondary | ICD-10-CM

## 2013-11-11 NOTE — Patient Instructions (Signed)
Your physician wants you to follow-up in: 6 months You will receive a reminder letter in the mail two months in advance. If you don't receive a letter, please call our office to schedule the follow-up appointment.     Your physician recommends that you continue on your current medications as directed. Please refer to the Current Medication list given to you today.      Thank you for choosing [facility] !        

## 2013-11-11 NOTE — Progress Notes (Signed)
    Clinical Summary [name] is an 80 y.o.female last seen in March. She reports no palpitations or unusual shortness of breath. She tries to stay active with her ADLs, also walks for exercise and to keep her hip arthritis in check. Main complaint is of left shoulder arthritis symptoms.  She reports compliance with her medications. ECG shows atrial fibrillation, rate controlled. She denies any significant bleeding episodes on Coumadin.   No Known Allergies  Current Outpatient Prescriptions  Medication Sig Dispense Refill  . amLODipine (NORVASC) 10 MG tablet Take 1 tablet (10 mg total) by mouth daily.  90 tablet  3  . atorvastatin (LIPITOR) 20 MG tablet Take 20 mg by mouth daily.        . carvedilol (COREG) 12.5 MG tablet Take 12.5 mg by mouth 2 (two) times daily with meals.        Marland Kitchen. glimepiride (AMARYL) 2 MG tablet Take 2 mg by mouth daily.        Marland Kitchen. lisinopril (PRINIVIL,ZESTRIL) 40 MG tablet Take 40 mg by mouth 2 (two) times daily.        . metFORMIN (GLUCOPHAGE) 500 MG tablet Take 500 mg by mouth 2 (two) times daily with meals.        . solifenacin (VESICARE) 5 MG tablet Take 5 mg by mouth daily.      . travoprost, benzalkonium, (TRAVATAN) 0.004 % ophthalmic solution Place 1 drop into both eyes at bedtime.      Marland Kitchen. warfarin (COUMADIN) 5 MG tablet Take 1 tablet daily except 1 1/2 tablets on Mondays, Wednesdays and Fridays  45 tablet  6   No current facility-administered medications for this visit.    Past Medical History  Diagnosis Date  . Essential hypertension, benign   . Atrial fibrillation   . Type 2 diabetes mellitus   . Mixed hyperlipidemia     Social History [name] reports that she has never smoked. She has never used smokeless tobacco. [name] reports that she does not drink alcohol.  Review of Systems Part of hearing. Stable appetite. No recent hospitalizations. Other symptoms reviewed and negative.  Physical Examination Filed Vitals:   11/11/13 1410    BP: 140/72  Pulse: 83   Filed Weights   11/11/13 1410  Weight: 153 lb (69.4 kg)    Comfortable at rest.  HEENT: Conjunctiva and lids normal, oropharynx with moist mucosa.  Neck: Supple, no carotid bruits or thyromegaly.  Lungs: Clear with diminished breath sounds, nonlabored.  Cardiac: Irregularly irregular, no S3.  Abdomen: Soft, nontender, bowel sounds present.  Extremities: No pitting edema. Distal pulses one plus.    Problem List and Plan   Atrial fibrillation Chronic, continue strategy of heart rate control and anticoagulation.  Essential hypertension, benign No changes made in current regimen. Keep followup with Dr. [name].    Jonelle SidleSamuel G. Trevonte Ashkar, M.D., F.A.C.C.

## 2013-11-11 NOTE — Assessment & Plan Note (Signed)
Chronic, continue strategy of heart rate control and anticoagulation. 

## 2013-11-11 NOTE — Assessment & Plan Note (Signed)
No changes made in current regimen. Keep followup with Dr. [name].

## 2013-11-27 ENCOUNTER — Ambulatory Visit (INDEPENDENT_AMBULATORY_CARE_PROVIDER_SITE_OTHER): Payer: Managed Care, Other (non HMO) | Admitting: *Deleted

## 2013-11-27 DIAGNOSIS — I4891 Unspecified atrial fibrillation: Secondary | ICD-10-CM

## 2013-11-27 DIAGNOSIS — Z7901 Long term (current) use of anticoagulants: Secondary | ICD-10-CM

## 2013-11-27 DIAGNOSIS — Z5181 Encounter for therapeutic drug level monitoring: Secondary | ICD-10-CM

## 2013-11-27 LAB — POCT INR: INR: 3.4

## 2013-12-03 ENCOUNTER — Encounter: Payer: Self-pay | Admitting: Orthopedic Surgery

## 2013-12-12 ENCOUNTER — Telehealth: Payer: Self-pay | Admitting: *Deleted

## 2013-12-12 MED ORDER — AMLODIPINE BESYLATE 10 MG PO TABS: 10.0000 mg | ORAL_TABLET | Freq: Every day | ORAL | Status: DC

## 2013-12-12 NOTE — Telephone Encounter (Signed)
Received fax from [facility] refill amlodipine 10 mg daily

## 2013-12-25 ENCOUNTER — Ambulatory Visit (INDEPENDENT_AMBULATORY_CARE_PROVIDER_SITE_OTHER): Payer: Managed Care, Other (non HMO) | Admitting: *Deleted

## 2013-12-25 DIAGNOSIS — Z7901 Long term (current) use of anticoagulants: Secondary | ICD-10-CM

## 2013-12-25 DIAGNOSIS — I4891 Unspecified atrial fibrillation: Secondary | ICD-10-CM

## 2013-12-25 DIAGNOSIS — Z5181 Encounter for therapeutic drug level monitoring: Secondary | ICD-10-CM

## 2013-12-25 LAB — POCT INR: INR: 4.2

## 2013-12-31 ENCOUNTER — Ambulatory Visit: Payer: Managed Care, Other (non HMO) | Admitting: Orthopedic Surgery

## 2014-01-08 ENCOUNTER — Ambulatory Visit (INDEPENDENT_AMBULATORY_CARE_PROVIDER_SITE_OTHER): Payer: Managed Care, Other (non HMO) | Admitting: *Deleted

## 2014-01-08 DIAGNOSIS — Z5181 Encounter for therapeutic drug level monitoring: Secondary | ICD-10-CM

## 2014-01-08 DIAGNOSIS — I4891 Unspecified atrial fibrillation: Secondary | ICD-10-CM

## 2014-01-08 DIAGNOSIS — Z7901 Long term (current) use of anticoagulants: Secondary | ICD-10-CM

## 2014-01-08 LAB — POCT INR: INR: 2.5

## 2014-01-09 ENCOUNTER — Other Ambulatory Visit: Payer: Self-pay | Admitting: Cardiology

## 2014-02-05 ENCOUNTER — Ambulatory Visit (INDEPENDENT_AMBULATORY_CARE_PROVIDER_SITE_OTHER): Payer: Managed Care, Other (non HMO) | Admitting: *Deleted

## 2014-02-05 DIAGNOSIS — Z7901 Long term (current) use of anticoagulants: Secondary | ICD-10-CM

## 2014-02-05 DIAGNOSIS — I4891 Unspecified atrial fibrillation: Secondary | ICD-10-CM

## 2014-02-05 DIAGNOSIS — Z5181 Encounter for therapeutic drug level monitoring: Secondary | ICD-10-CM

## 2014-02-05 LAB — POCT INR: INR: 2.6

## 2014-03-05 ENCOUNTER — Ambulatory Visit (INDEPENDENT_AMBULATORY_CARE_PROVIDER_SITE_OTHER): Payer: Medicare HMO | Admitting: *Deleted

## 2014-03-05 DIAGNOSIS — I4891 Unspecified atrial fibrillation: Secondary | ICD-10-CM

## 2014-03-05 DIAGNOSIS — Z5181 Encounter for therapeutic drug level monitoring: Secondary | ICD-10-CM

## 2014-03-05 DIAGNOSIS — Z7901 Long term (current) use of anticoagulants: Secondary | ICD-10-CM

## 2014-03-05 LAB — POCT INR: INR: 2.2

## 2014-04-09 ENCOUNTER — Ambulatory Visit (INDEPENDENT_AMBULATORY_CARE_PROVIDER_SITE_OTHER): Payer: Medicare HMO | Admitting: *Deleted

## 2014-04-09 DIAGNOSIS — Z7901 Long term (current) use of anticoagulants: Secondary | ICD-10-CM

## 2014-04-09 DIAGNOSIS — I4891 Unspecified atrial fibrillation: Secondary | ICD-10-CM

## 2014-04-09 DIAGNOSIS — Z5181 Encounter for therapeutic drug level monitoring: Secondary | ICD-10-CM

## 2014-04-09 LAB — POCT INR: INR: 2

## 2014-04-26 ENCOUNTER — Other Ambulatory Visit: Payer: Self-pay | Admitting: Cardiology

## 2014-05-15 ENCOUNTER — Encounter: Payer: Self-pay | Admitting: Cardiology

## 2014-05-15 ENCOUNTER — Ambulatory Visit (INDEPENDENT_AMBULATORY_CARE_PROVIDER_SITE_OTHER): Payer: Medicare HMO | Admitting: Cardiology

## 2014-05-15 VITALS — BP 168/86 | HR 88 | Ht 66.0 in | Wt 152.0 lb

## 2014-05-15 DIAGNOSIS — I482 Chronic atrial fibrillation, unspecified: Secondary | ICD-10-CM

## 2014-05-15 DIAGNOSIS — I10 Essential (primary) hypertension: Secondary | ICD-10-CM | POA: Diagnosis not present

## 2014-05-15 NOTE — Progress Notes (Signed)
Cardiology Office Note  Date: 05/15/2014   ID: Melissa [name], DOB 28-Jul-1930, MRN [id number]  PCP: Fredirick Maudlin, MD  Primary Cardiologist: Nona Dell, MD   Chief Complaint  Patient presents with  . Atrial Fibrillation    History of Present Illness: Melissa [name] is an 81 y.o. female last seen in October 2015. She presents for a routine follow-up visit. She denies any significant sense of palpitations or chest pain. Still functions and her basic ADLs including light house chores and vacuuming. She reports NYHA class II dyspnea.  We reviewed her medications. She continues on a stable cardiac regimen as outlined below including Coumadin. No reported bleeding problems.   Past Medical History  Diagnosis Date  . Essential hypertension, benign   . Atrial fibrillation   . Type 2 diabetes mellitus   . Mixed hyperlipidemia     Current Outpatient Prescriptions  Medication Sig Dispense Refill  . amLODipine (NORVASC) 10 MG tablet TAKE (1) TABLET BY MOUTH DAILY. 30 tablet 6  . atorvastatin (LIPITOR) 20 MG tablet Take 20 mg by mouth daily.      . carvedilol (COREG) 12.5 MG tablet Take 12.5 mg by mouth 2 (two) times daily with meals.      Marland Kitchen glimepiride (AMARYL) 2 MG tablet Take 2 mg by mouth daily.      Marland Kitchen lisinopril (PRINIVIL,ZESTRIL) 40 MG tablet Take 40 mg by mouth 2 (two) times daily.      . metFORMIN (GLUCOPHAGE) 500 MG tablet Take 500 mg by mouth 2 (two) times daily with meals.      . solifenacin (VESICARE) 5 MG tablet Take 5 mg by mouth daily.    . travoprost, benzalkonium, (TRAVATAN) 0.004 % ophthalmic solution Place 1 drop into both eyes at bedtime.    Marland Kitchen warfarin (COUMADIN) 5 MG tablet Take 1 tablet daily except 1 1/2 tablets on Wednesdays 45 tablet 4   No current facility-administered medications for this visit.    Allergies:  Review of patient's allergies indicates no known allergies.   Social History: The patient  reports that she has never smoked. She has  never used smokeless tobacco. She reports that she does not drink alcohol or use illicit drugs.   ROS:  Please see the history of present illness. Otherwise, complete review of systems is positive for decreased hearing.  All other systems are reviewed and negative.   Physical Exam: VS:  BP 168/86 mmHg  Pulse 88  Ht  (1.676 m)  Wt 152 lb (68.947 kg)  BMI 24.55 kg/m2  SpO2 98%, BMI Body mass index is 24.55 kg/(m^2).  Wt Readings from Last 3 Encounters:  05/15/14 152 lb (68.947 kg)  11/11/13 153 lb (69.4 kg)  05/03/13 157 lb (71.215 kg)     Comfortable at rest.  HEENT: Conjunctiva and lids normal, oropharynx with moist mucosa.  Neck: Supple, no carotid bruits or thyromegaly.  Lungs: Clear with diminished breath sounds, nonlabored.  Cardiac: Irregularly irregular, no S3.  Abdomen: Soft, nontender, bowel sounds present.  Extremities: No pitting edema. Distal pulses one plus.    ECG: Tracing from 11/11/2013 showed rate-controlled atrial fibrillation with nonspecific ST-T changes.  Recent Labwork:  04/09/2014: INR 2.0   ASSESSMENT AND PLAN:  1. Chronic atrial fibrillation, continue strategy of heart rate control and anticoagulation. Patient is asymptomatic.  2. Essential hypertension, reports compliance with Norvasc, lisinopril, and Coreg. Blood pressure is elevated today. We discussed limiting salt in the diet, keep follow-up with Dr. Juanetta Gosling.  Current medicines were reviewed at length with the patient today.  Disposition: FU with me in 6 months.   Signed, Jonelle SidleSamuel G. Aviah Sorci, MD, [facility]ty Surgical CenterFACC 05/15/2014 10:17 AM    [facility] at [facility] [address], [place], [place] [address] Phone: [phone]; Fax: [phone]

## 2014-05-15 NOTE — Patient Instructions (Signed)
Your physician wants you to follow-up in: 6 months with Dr.McDowell You will receive a reminder letter in the mail two months in advance. If you don't receive a letter, please call our office to schedule the follow-up appointment.     Your physician recommends that you continue on your current medications as directed. Please refer to the Current Medication list given to you today.     Thank you for choosing [facility] !        

## 2014-05-21 ENCOUNTER — Ambulatory Visit (INDEPENDENT_AMBULATORY_CARE_PROVIDER_SITE_OTHER): Payer: Medicare HMO | Admitting: *Deleted

## 2014-05-21 DIAGNOSIS — Z7901 Long term (current) use of anticoagulants: Secondary | ICD-10-CM

## 2014-05-21 DIAGNOSIS — I4891 Unspecified atrial fibrillation: Secondary | ICD-10-CM | POA: Diagnosis not present

## 2014-05-21 DIAGNOSIS — I480 Paroxysmal atrial fibrillation: Secondary | ICD-10-CM

## 2014-05-21 DIAGNOSIS — Z5181 Encounter for therapeutic drug level monitoring: Secondary | ICD-10-CM

## 2014-05-21 LAB — POCT INR: INR: 2.4

## 2014-07-02 ENCOUNTER — Ambulatory Visit (INDEPENDENT_AMBULATORY_CARE_PROVIDER_SITE_OTHER): Payer: Medicare HMO | Admitting: *Deleted

## 2014-07-02 DIAGNOSIS — Z7901 Long term (current) use of anticoagulants: Secondary | ICD-10-CM

## 2014-07-02 DIAGNOSIS — Z5181 Encounter for therapeutic drug level monitoring: Secondary | ICD-10-CM | POA: Diagnosis not present

## 2014-07-02 DIAGNOSIS — I4891 Unspecified atrial fibrillation: Secondary | ICD-10-CM | POA: Diagnosis not present

## 2014-07-02 LAB — POCT INR: INR: 2.4

## 2014-08-13 ENCOUNTER — Ambulatory Visit (INDEPENDENT_AMBULATORY_CARE_PROVIDER_SITE_OTHER): Payer: Medicare HMO | Admitting: *Deleted

## 2014-08-13 DIAGNOSIS — Z5181 Encounter for therapeutic drug level monitoring: Secondary | ICD-10-CM

## 2014-08-13 DIAGNOSIS — Z7901 Long term (current) use of anticoagulants: Secondary | ICD-10-CM | POA: Diagnosis not present

## 2014-08-13 DIAGNOSIS — I4891 Unspecified atrial fibrillation: Secondary | ICD-10-CM

## 2014-08-13 LAB — POCT INR: INR: 3.3

## 2014-08-21 ENCOUNTER — Other Ambulatory Visit: Payer: Self-pay | Admitting: Cardiology

## 2014-09-17 ENCOUNTER — Ambulatory Visit (INDEPENDENT_AMBULATORY_CARE_PROVIDER_SITE_OTHER): Payer: Medicare HMO | Admitting: *Deleted

## 2014-09-17 DIAGNOSIS — Z7901 Long term (current) use of anticoagulants: Secondary | ICD-10-CM | POA: Diagnosis not present

## 2014-09-17 DIAGNOSIS — I4891 Unspecified atrial fibrillation: Secondary | ICD-10-CM

## 2014-09-17 DIAGNOSIS — Z5181 Encounter for therapeutic drug level monitoring: Secondary | ICD-10-CM

## 2014-09-17 LAB — POCT INR: INR: 3.2

## 2014-10-15 ENCOUNTER — Other Ambulatory Visit: Payer: Self-pay | Admitting: Cardiology

## 2014-10-17 ENCOUNTER — Ambulatory Visit (INDEPENDENT_AMBULATORY_CARE_PROVIDER_SITE_OTHER): Payer: Medicare HMO | Admitting: *Deleted

## 2014-10-17 DIAGNOSIS — Z5181 Encounter for therapeutic drug level monitoring: Secondary | ICD-10-CM | POA: Diagnosis not present

## 2014-10-17 DIAGNOSIS — I4891 Unspecified atrial fibrillation: Secondary | ICD-10-CM | POA: Diagnosis not present

## 2014-10-17 DIAGNOSIS — Z7901 Long term (current) use of anticoagulants: Secondary | ICD-10-CM

## 2014-10-17 LAB — POCT INR: INR: 2.6

## 2014-11-12 ENCOUNTER — Ambulatory Visit (INDEPENDENT_AMBULATORY_CARE_PROVIDER_SITE_OTHER): Payer: Medicare HMO | Admitting: Cardiology

## 2014-11-12 ENCOUNTER — Encounter: Payer: Self-pay | Admitting: Cardiology

## 2014-11-12 ENCOUNTER — Ambulatory Visit (INDEPENDENT_AMBULATORY_CARE_PROVIDER_SITE_OTHER): Payer: Medicare HMO | Admitting: *Deleted

## 2014-11-12 VITALS — BP 138/80 | HR 78 | Ht 66.0 in | Wt 142.0 lb

## 2014-11-12 DIAGNOSIS — I482 Chronic atrial fibrillation, unspecified: Secondary | ICD-10-CM

## 2014-11-12 DIAGNOSIS — I10 Essential (primary) hypertension: Secondary | ICD-10-CM | POA: Diagnosis not present

## 2014-11-12 DIAGNOSIS — I4891 Unspecified atrial fibrillation: Secondary | ICD-10-CM

## 2014-11-12 DIAGNOSIS — Z7901 Long term (current) use of anticoagulants: Secondary | ICD-10-CM | POA: Diagnosis not present

## 2014-11-12 DIAGNOSIS — Z5181 Encounter for therapeutic drug level monitoring: Secondary | ICD-10-CM

## 2014-11-12 LAB — POCT INR: INR: 2.1

## 2014-11-12 NOTE — Patient Instructions (Signed)
Your physician wants you to follow-up in: 6 MONTHS WITH DR. MCDOWELL. You will receive a reminder letter in the mail two months in advance. If you don't receive a letter, please call our office to schedule the follow-up appointment.   Your physician recommends that you continue on your current medications as directed. Please refer to the Current Medication list given to you today.  Thanks for choosing [facility]!!!   

## 2014-11-12 NOTE — Progress Notes (Signed)
Cardiology Office Note  Date: 11/12/2014   ID: CARLIN MAMONE, DOB 1930-07-06, MRN [id number]  PCP: Fredirick Maudlin, MD  Primary Cardiologist: Nona Dell, MD   Chief Complaint  Patient presents with  . Atrial Fibrillation    History of Present Illness: Melissa [name] is an 81 y.o. female last seen in April. She presents for a routine follow-up visit. She does not report any palpitations or chest pain with activity. She mentions that she does have dependent ankle edema that is most notable toward the end of the day when she has been up on her feet, goes away at night time when her feet are elevated. She has been trying to watch her salt more carefully.  We reviewed her medications. Blood pressure today looks adequately controlled systolic in the 130s. She is on Norvasc which could be contributing to some of her ankle edema as well.  She continues to follow in the anticoagulation clinic on Coumadin. She denies any falls or spontaneous bleeding episodes.  ECG today shows rate-controlled atrial fibrillation with nonspecific ST-T changes.   Past Medical History  Diagnosis Date  . Essential hypertension, benign   . Atrial fibrillation (HCC)   . Type 2 diabetes mellitus (HCC)   . Mixed hyperlipidemia     Current Outpatient Prescriptions  Medication Sig Dispense Refill  . amLODipine (NORVASC) 10 MG tablet TAKE (1) TABLET BY MOUTH DAILY. 30 tablet 6  . atorvastatin (LIPITOR) 20 MG tablet Take 20 mg by mouth daily.      . carvedilol (COREG) 25 MG tablet Take 25 mg by mouth 2 (two) times daily with a meal.    . furosemide (LASIX) 40 MG tablet     . glimepiride (AMARYL) 2 MG tablet Take 2 mg by mouth daily.      Marland Kitchen HYDROcodone-acetaminophen (NORCO/VICODIN) 5-325 MG tablet     . lisinopril (PRINIVIL,ZESTRIL) 40 MG tablet Take 40 mg by mouth 2 (two) times daily.      . metFORMIN (GLUCOPHAGE) 500 MG tablet Take 500 mg by mouth 2 (two) times daily with meals.      .  solifenacin (VESICARE) 5 MG tablet Take 5 mg by mouth daily.    . TRAVATAN Z 0.004 % SOLN ophthalmic solution     . travoprost, benzalkonium, (TRAVATAN) 0.004 % ophthalmic solution Place 1 drop into both eyes at bedtime.    Marland Kitchen warfarin (COUMADIN) 5 MG tablet Take 1 tablet daily or as directed 45 tablet 3   No current facility-administered medications for this visit.    Allergies:  Review of patient's allergies indicates no known allergies.   Social History: The patient  reports that she has never smoked. She has never used smokeless tobacco. She reports that she does not drink alcohol or use illicit drugs.   ROS:  Please see the history of present illness. Otherwise, complete review of systems is positive for decreased hearing.  All other systems are reviewed and negative.   Physical Exam: VS:  BP 138/80 mmHg  Pulse 78  Ht  (1.676 m)  Wt 142 lb (64.411 kg)  BMI 22.93 kg/m2  SpO2 97%, BMI Body mass index is 22.93 kg/(m^2).  Wt Readings from Last 3 Encounters:  11/12/14 142 lb (64.411 kg)  05/15/14 152 lb (68.947 kg)  11/11/13 153 lb (69.4 kg)     Elderly woman, appears comfortable at rest. Uses a cane. HEENT: Conjunctiva and lids normal, oropharynx with moist mucosa.  Neck: Supple, no carotid bruits  or thyromegaly.  Lungs: Clear with diminished breath sounds, nonlabored.  Cardiac: Irregularly irregular, no S3.  Abdomen: Soft, nontender, bowel sounds present.  Extremities: 1+ ankle edema, left worse than right. Distal pulses one plus. Skin: Warm and dry.  ECG: ECG is ordered today.  Recent Labwork:  10/17/2014: INR 2.6  Other Studies Reviewed Today:  Echocardiogram 09/08/2008: Study Conclusions  1. Left ventricle: The cavity size was normal. Wall thickness was  increased in a pattern of mild to moderate LVH. Systolic function  was normal. The estimated ejection fraction was in the range of  55% to 60%. Wall motion was normal; there were no regional  wall  motion abnormalities. 2. Aortic valve: Mildly calcified annulus. Trileaflet. 3. Mitral valve: Mild regurgitation. 4. Left atrium: The atrium was moderately dilated. 5. Right atrium: The atrium was moderately dilated. 6. Tricuspid valve: Moderate regurgitation. 7. Pulmonary arteries: Systolic pressure was moderately increased.  PA peak pressure: 79mDorMorton Plant NFredricka BoniDartmouth Hitchcock Ambulatory Surg34mFreada [phone] SpeciaJoycelyn Sc46mmiSDor8Russell Fredricka BoniNortheast Georgia Medical Cent1mFreada BergJoycelyn Sc33mmiDor748Prov[facility] Mary TransitFredricka BoniUt Health East Texas53mFreada [phone] Scott And White TexJoycelyn Sc48mmiAdirondaFredricka BoniAsheville-Oteen Va Medi20mFreada [phone] Sc3Dor362 SoutSaint Joseph RegionFredricka BoniNor Lea Distric98mFreada [phone] Sc71mmiFlowDor75 BlueVibra Rehabilitation HosFredricka BoniHebrew Home And Ho48mFreada [phone] Sc60mmiWDor2Ophthalmology [facility] Dba Orlando OphthalmoloFredricka BoniIndiana University Health Morgan Ho69mFreada [phone] Sc17mmiDor654 STurquoiFredricka BoniMosaic Medi47mFreada [phone] Sc62mmiBDor24Surgical Specialty CentFredricka BoniEastern Orange Ambulatory Surgery 63mFreada [phone] Sc65mmiDor88 NorPenn Medical Fredricka BoniNovamed Management Se47mFreada [phone] Sc73Dor9 SAscension Standish CFredricka BoniRussellvill28mFreada [phone] RivJoycelyn Sc66mmiPoDor7842 CommunityFredricka BoniBaylor Scott & White Medical Center At 23mFreada [phone] Sc75mmiDor9757 BuPlains Fredricka BoniMclean Ambulatory S61mFreada [phone] 4606onCorJoycelyn Sc66mmiFDorMidtown MeFredricka BoniHealthsouth Rehabilitation Hospi16mFreada Berge6827Joycelyn Sc78mmDorSuncoast SFredricka BoniNorthern California [phone] Sc45mmiHarreDor9412 Old LakeFredricka BoniNorthern Light Maine Coas35mFreada [phone] Sc24mDor5R[facility] Fredricka BoniIndian River Medical Center-Behavioral Hea61mFreada [phone] Sc77mDor31 The Endoscopy CFredricka BoniLakeview Center - Psychiatri46mFreada [phone] CorrectJoycelyn Sc31mmiSDor9920 EasLexingtFredricka BoniAssumption Communit76mFreada [phone] Cleveland LLC DbaJoycelyn Sc57mmiWiDorHeart Of FloriFredricka BoniBaylor Scott & White Medical Center - 53mFreada [phone] Ambulatory SurJoycelyn Schmidal Intervention Servicesextracardiac: A small pericardial effusion was  identified.  ASSESSMENT AND PLAN:  1. Chronic atrial fibrillation. Asymptomatic in terms of palpitations. Plan to continue strategy of heart rate control and anticoagulation on current regimen. She is due for a follow-up INR as well.  2. Essential hypertension, blood pressure adequately controlled today. No changes made. She does have lower leg/ankle edema that is dependent, potentially could be contributed to by Norvasc, but she is also cutting back salt in her diet with some improvement. No changes were made today.  Current medicines were reviewed at length with the patient today.   Orders Placed This Encounter  Procedures  . EKG 12-Lead    Disposition: FU with me in 6 months.   Signed, Samuel G. McDowell, MD, FACC 11/12/2014 8:36 AM    [facility] at [place] [address], [place], [place] [address] Phone: [phone]; Fax: [phone]

## 2014-12-17 ENCOUNTER — Ambulatory Visit (INDEPENDENT_AMBULATORY_CARE_PROVIDER_SITE_OTHER): Payer: Medicare HMO | Admitting: *Deleted

## 2014-12-17 DIAGNOSIS — Z7901 Long term (current) use of anticoagulants: Secondary | ICD-10-CM

## 2014-12-17 DIAGNOSIS — Z5181 Encounter for therapeutic drug level monitoring: Secondary | ICD-10-CM | POA: Diagnosis not present

## 2014-12-17 DIAGNOSIS — I4891 Unspecified atrial fibrillation: Secondary | ICD-10-CM | POA: Diagnosis not present

## 2014-12-17 LAB — POCT INR: INR: 2.4

## 2015-01-28 ENCOUNTER — Ambulatory Visit (INDEPENDENT_AMBULATORY_CARE_PROVIDER_SITE_OTHER): Payer: Medicare HMO | Admitting: Pharmacist

## 2015-01-28 DIAGNOSIS — I4891 Unspecified atrial fibrillation: Secondary | ICD-10-CM

## 2015-01-28 DIAGNOSIS — Z5181 Encounter for therapeutic drug level monitoring: Secondary | ICD-10-CM | POA: Diagnosis not present

## 2015-01-28 DIAGNOSIS — Z7901 Long term (current) use of anticoagulants: Secondary | ICD-10-CM | POA: Diagnosis not present

## 2015-01-28 LAB — POCT INR: INR: 3.2

## 2015-02-17 ENCOUNTER — Encounter (HOSPITAL_COMMUNITY): Payer: Self-pay | Admitting: Emergency Medicine

## 2015-02-17 ENCOUNTER — Emergency Department (HOSPITAL_COMMUNITY): Payer: Medicare HMO

## 2015-02-17 ENCOUNTER — Inpatient Hospital Stay (HOSPITAL_COMMUNITY)
Admission: EM | Admit: 2015-02-17 | Discharge: 2015-02-25 | DRG: 291 | Disposition: A | Discharge status: Home Health | Payer: Medicare HMO | Attending: Pulmonary Disease | Admitting: Pulmonary Disease

## 2015-02-17 DIAGNOSIS — Z7984 Long term (current) use of oral hypoglycemic drugs: Secondary | ICD-10-CM | POA: Diagnosis not present

## 2015-02-17 DIAGNOSIS — Z7901 Long term (current) use of anticoagulants: Secondary | ICD-10-CM | POA: Diagnosis not present

## 2015-02-17 DIAGNOSIS — I10 Essential (primary) hypertension: Secondary | ICD-10-CM | POA: Diagnosis present

## 2015-02-17 DIAGNOSIS — I4819 Other persistent atrial fibrillation: Secondary | ICD-10-CM | POA: Diagnosis present

## 2015-02-17 DIAGNOSIS — I481 Persistent atrial fibrillation: Secondary | ICD-10-CM

## 2015-02-17 DIAGNOSIS — I5021 Acute systolic (congestive) heart failure: Secondary | ICD-10-CM | POA: Diagnosis present

## 2015-02-17 DIAGNOSIS — I872 Venous insufficiency (chronic) (peripheral): Secondary | ICD-10-CM | POA: Diagnosis present

## 2015-02-17 DIAGNOSIS — Z8249 Family history of ischemic heart disease and other diseases of the circulatory system: Secondary | ICD-10-CM | POA: Diagnosis not present

## 2015-02-17 DIAGNOSIS — I482 Chronic atrial fibrillation: Secondary | ICD-10-CM | POA: Diagnosis present

## 2015-02-17 DIAGNOSIS — I5033 Acute on chronic diastolic (congestive) heart failure: Secondary | ICD-10-CM | POA: Diagnosis present

## 2015-02-17 DIAGNOSIS — E782 Mixed hyperlipidemia: Secondary | ICD-10-CM | POA: Diagnosis present

## 2015-02-17 DIAGNOSIS — I5031 Acute diastolic (congestive) heart failure: Secondary | ICD-10-CM | POA: Diagnosis not present

## 2015-02-17 DIAGNOSIS — N179 Acute kidney failure, unspecified: Secondary | ICD-10-CM | POA: Diagnosis present

## 2015-02-17 DIAGNOSIS — Z96642 Presence of left artificial hip joint: Secondary | ICD-10-CM | POA: Diagnosis present

## 2015-02-17 DIAGNOSIS — I071 Rheumatic tricuspid insufficiency: Secondary | ICD-10-CM | POA: Diagnosis present

## 2015-02-17 DIAGNOSIS — H9190 Unspecified hearing loss, unspecified ear: Secondary | ICD-10-CM | POA: Diagnosis present

## 2015-02-17 DIAGNOSIS — Z833 Family history of diabetes mellitus: Secondary | ICD-10-CM

## 2015-02-17 DIAGNOSIS — I130 Hypertensive heart and chronic kidney disease with heart failure and stage 1 through stage 4 chronic kidney disease, or unspecified chronic kidney disease: Principal | ICD-10-CM | POA: Diagnosis present

## 2015-02-17 DIAGNOSIS — E1122 Type 2 diabetes mellitus with diabetic chronic kidney disease: Secondary | ICD-10-CM | POA: Diagnosis present

## 2015-02-17 DIAGNOSIS — N189 Chronic kidney disease, unspecified: Secondary | ICD-10-CM | POA: Diagnosis present

## 2015-02-17 DIAGNOSIS — R0600 Dyspnea, unspecified: Secondary | ICD-10-CM | POA: Diagnosis not present

## 2015-02-17 DIAGNOSIS — I272 Other secondary pulmonary hypertension: Secondary | ICD-10-CM | POA: Diagnosis present

## 2015-02-17 DIAGNOSIS — I509 Heart failure, unspecified: Secondary | ICD-10-CM

## 2015-02-17 LAB — BASIC METABOLIC PANEL
ANION GAP: 12 (ref 5–15)
BUN: 21 mg/dL — ABNORMAL HIGH (ref 6–20)
CHLORIDE: 110 mmol/L (ref 101–111)
CO2: 23 mmol/L (ref 22–32)
Calcium: 9.2 mg/dL (ref 8.9–10.3)
Creatinine, Ser: 1.37 mg/dL — ABNORMAL HIGH (ref 0.44–1.00)
GFR calc non Af Amer: 35 mL/min — ABNORMAL LOW (ref >60)
GFR, EST AFRICAN AMERICAN: 40 mL/min — AB (ref >60)
Glucose, Bld: 126 mg/dL — ABNORMAL HIGH (ref 65–99)
Potassium: 3.8 mmol/L (ref 3.5–5.1)
Sodium: 145 mmol/L (ref 135–145)

## 2015-02-17 LAB — CBC WITH DIFFERENTIAL/PLATELET
BASOS ABS: 0.1 10*3/uL (ref 0.0–0.1)
BASOS PCT: 1 %
Eosinophils Absolute: 0.2 10*3/uL (ref 0.0–0.7)
Eosinophils Relative: 3 %
HEMATOCRIT: 34.5 % — AB (ref 36.0–46.0)
Hemoglobin: 11.3 g/dL — ABNORMAL LOW (ref 12.0–15.0)
Lymphocytes Relative: 26 %
Lymphs Abs: 2.2 10*3/uL (ref 0.7–4.0)
MCH: 31.9 pg (ref 26.0–34.0)
MCHC: 32.8 g/dL (ref 30.0–36.0)
MCV: 97.5 fL (ref 78.0–100.0)
MONOS PCT: 8 %
Monocytes Absolute: 0.7 10*3/uL (ref 0.1–1.0)
NEUTROS ABS: 5.4 10*3/uL (ref 1.7–7.7)
NEUTROS PCT: 62 %
Platelets: 235 10*3/uL (ref 150–400)
RBC: 3.54 MIL/uL — ABNORMAL LOW (ref 3.87–5.11)
RDW: 15 % (ref 11.5–15.5)
WBC: 8.6 10*3/uL (ref 4.0–10.5)

## 2015-02-17 LAB — PROTIME-INR
INR: 2.49 — AB (ref 0.00–1.49)
PROTHROMBIN TIME: 26.6 s — AB (ref 11.6–15.2)

## 2015-02-17 LAB — BRAIN NATRIURETIC PEPTIDE: B Natriuretic Peptide: 982 pg/mL — ABNORMAL HIGH (ref 0.0–100.0)

## 2015-02-17 LAB — TROPONIN I: Troponin I: 0.05 ng/mL — ABNORMAL HIGH (ref <0.031)

## 2015-02-17 LAB — APTT: aPTT: 42 seconds — ABNORMAL HIGH (ref 24–37)

## 2015-02-17 LAB — GLUCOSE, CAPILLARY: GLUCOSE-CAPILLARY: 183 mg/dL — AB (ref 65–99)

## 2015-02-17 LAB — TSH: TSH: 3.793 u[IU]/mL (ref 0.350–4.500)

## 2015-02-17 MED ORDER — WARFARIN - PHARMACIST DOSING INPATIENT: Freq: Every day | Status: DC

## 2015-02-17 MED ORDER — ATORVASTATIN CALCIUM 20 MG PO TABS
20.0000 mg | ORAL_TABLET | Freq: Every day | ORAL | Status: DC
  Administered 2015-02-18 – 2015-02-24 (×6): 20 mg via ORAL
  Filled 2015-02-17 (×6): qty 1

## 2015-02-17 MED ORDER — SODIUM CHLORIDE 0.9 % IJ SOLN
3.0000 mL | Freq: Two times a day (BID) | INTRAMUSCULAR | Status: DC
  Administered 2015-02-17 – 2015-02-25 (×15): 3 mL via INTRAVENOUS

## 2015-02-17 MED ORDER — FUROSEMIDE 10 MG/ML IJ SOLN
40.0000 mg | INTRAMUSCULAR | Status: AC
  Administered 2015-02-17: 40 mg via INTRAVENOUS
  Filled 2015-02-17: qty 4

## 2015-02-17 MED ORDER — ACETAMINOPHEN 325 MG PO TABS
650.0000 mg | ORAL_TABLET | Freq: Four times a day (QID) | ORAL | Status: DC | PRN
  Administered 2015-02-19 – 2015-02-25 (×4): 650 mg via ORAL
  Filled 2015-02-17 (×4): qty 2

## 2015-02-17 MED ORDER — FUROSEMIDE 10 MG/ML IJ SOLN
40.0000 mg | Freq: Two times a day (BID) | INTRAMUSCULAR | Status: DC
  Administered 2015-02-18 – 2015-02-22 (×9): 40 mg via INTRAVENOUS
  Filled 2015-02-17 (×9): qty 4

## 2015-02-17 MED ORDER — DARIFENACIN HYDROBROMIDE ER 7.5 MG PO TB24
7.5000 mg | ORAL_TABLET | Freq: Every day | ORAL | Status: DC
  Administered 2015-02-18 – 2015-02-25 (×8): 7.5 mg via ORAL
  Filled 2015-02-17 (×8): qty 1

## 2015-02-17 MED ORDER — LISINOPRIL 10 MG PO TABS
20.0000 mg | ORAL_TABLET | Freq: Two times a day (BID) | ORAL | Status: DC
  Administered 2015-02-17 – 2015-02-25 (×16): 20 mg via ORAL
  Filled 2015-02-17 (×16): qty 2

## 2015-02-17 MED ORDER — LATANOPROST 0.005 % OP SOLN
OPHTHALMIC | Status: AC
  Filled 2015-02-17: qty 2.5

## 2015-02-17 MED ORDER — INSULIN ASPART 100 UNIT/ML ~~LOC~~ SOLN: 0.0000 [IU] | Freq: Every day | SUBCUTANEOUS | Status: DC

## 2015-02-17 MED ORDER — GLIMEPIRIDE 2 MG PO TABS
2.0000 mg | ORAL_TABLET | Freq: Every day | ORAL | Status: DC
  Administered 2015-02-18 – 2015-02-25 (×8): 2 mg via ORAL
  Filled 2015-02-17 (×8): qty 1

## 2015-02-17 MED ORDER — WARFARIN SODIUM 5 MG PO TABS
5.0000 mg | ORAL_TABLET | Freq: Once | ORAL | Status: AC
  Administered 2015-02-17: 5 mg via ORAL
  Filled 2015-02-17: qty 1

## 2015-02-17 MED ORDER — INSULIN ASPART 100 UNIT/ML ~~LOC~~ SOLN
0.0000 [IU] | Freq: Three times a day (TID) | SUBCUTANEOUS | Status: DC
  Administered 2015-02-18 (×2): 1 [IU] via SUBCUTANEOUS
  Administered 2015-02-19 – 2015-02-20 (×2): 2 [IU] via SUBCUTANEOUS
  Administered 2015-02-21: 1 [IU] via SUBCUTANEOUS
  Administered 2015-02-21: 2 [IU] via SUBCUTANEOUS
  Administered 2015-02-22 – 2015-02-23 (×3): 1 [IU] via SUBCUTANEOUS
  Administered 2015-02-23 – 2015-02-24 (×2): 2 [IU] via SUBCUTANEOUS
  Administered 2015-02-25: 1 [IU] via SUBCUTANEOUS

## 2015-02-17 MED ORDER — AMLODIPINE BESYLATE 5 MG PO TABS
10.0000 mg | ORAL_TABLET | Freq: Every day | ORAL | Status: DC
  Administered 2015-02-18 – 2015-02-19 (×2): 10 mg via ORAL
  Filled 2015-02-17 (×2): qty 2

## 2015-02-17 MED ORDER — ONDANSETRON HCL 4 MG/2ML IJ SOLN: 4.0000 mg | Freq: Four times a day (QID) | INTRAMUSCULAR | Status: DC | PRN

## 2015-02-17 MED ORDER — CARVEDILOL 12.5 MG PO TABS
25.0000 mg | ORAL_TABLET | Freq: Two times a day (BID) | ORAL | Status: DC
  Administered 2015-02-18 – 2015-02-19 (×4): 25 mg via ORAL
  Filled 2015-02-17 (×4): qty 2

## 2015-02-17 MED ORDER — ONDANSETRON HCL 4 MG PO TABS: 4.0000 mg | ORAL_TABLET | Freq: Four times a day (QID) | ORAL | Status: DC | PRN

## 2015-02-17 MED ORDER — ACETAMINOPHEN 650 MG RE SUPP: 650.0000 mg | Freq: Four times a day (QID) | RECTAL | Status: DC | PRN

## 2015-02-17 MED ORDER — LATANOPROST 0.005 % OP SOLN
1.0000 [drp] | Freq: Every day | OPHTHALMIC | Status: DC
  Administered 2015-02-18 – 2015-02-24 (×8): 1 [drp] via OPHTHALMIC
  Filled 2015-02-17: qty 2.5

## 2015-02-17 MED ORDER — CETYLPYRIDINIUM CHLORIDE 0.05 % MT LIQD
7.0000 mL | Freq: Two times a day (BID) | OROMUCOSAL | Status: DC
  Administered 2015-02-17 – 2015-02-25 (×16): 7 mL via OROMUCOSAL

## 2015-02-17 NOTE — H&P (Signed)
[facility] History and Physical  Melissa HammedBettie L Cashman ZOX:[id number]:[id number] DOB: [birth year]    PCP:   Fredirick MaudlinHAWKINS,EDWARD L, MD   Chief Complaint: SOB for about 2 weeks.   HPI: Melissa [name] is an 82 y.o. female with hx of moderate TR, afib on anticoagulaiton, DM2, HLD, HTN, presented to the ER with upper respiratory symptoms for the past 2 weeks.  She had seen Dr Diona BrownerMcDowell for pedal edema in Oct and was thought they were dependent edema, aggravated with Norvasc.  She denied fever, chills, productive coughs, orthopnea or PND.  Evaluation in the ER showed EKG with nonspecific ST T changes, elevated BNP to the 900's, negative troponins, and CXR showed vascular congestion.  Her WBC is normal, and her Cr was 1.37 with normal K.  She was given IV Lasix, diuresed, felt better and hospitlaist was asked to admit her for new onset of CHF.   Rewiew of Systems:  Constitutional: Negative for malaise, fever and chills. No significant weight loss or weight gain Eyes: Negative for eye pain, redness and discharge, diplopia, visual changes, or flashes of light. ENMT: Negative for ear pain, hoarseness, nasal congestion, sinus pressure and sore throat. No headaches; tinnitus, drooling, or problem swallowing. Cardiovascular: Negative for chest pain, palpitations, diaphoresis, dyspnea ; No orthopnea, PND Respiratory: Negative for cough, hemoptysis, wheezing and stridor. No pleuritic chestpain. Gastrointestinal: Negative for nausea, vomiting, diarrhea, constipation, abdominal pain, melena, blood in stool, hematemesis, jaundice and rectal bleeding.    Genitourinary: Negative for frequency, dysuria, incontinence,flank pain and hematuria; Musculoskeletal: Negative for back pain and neck pain. Negative for swelling and trauma.;  Skin: . Negative for pruritus, rash, abrasions, bruising and skin lesion.; ulcerations Neuro: Negative for headache, lightheadedness and neck stiffness. Negative for weakness, altered level of  consciousness , altered mental status, extremity weakness, burning feet, involuntary movement, seizure and syncope.  Psych: negative for anxiety, depression, insomnia, tearfulness, panic attacks, hallucinations, paranoia, suicidal or homicidal ideation    Past Medical History  Diagnosis Date  . Essential hypertension, benign   . Atrial fibrillation (HCC)   . Type 2 diabetes mellitus (HCC)   . Mixed hyperlipidemia     Past Surgical History  Procedure Laterality Date  . Total hip arthroplasty  01/14/03    Left    Medications:  HOME MEDS: Prior to Admission medications   Medication Sig Start Date End Date Taking? Authorizing Provider  amLODipine (NORVASC) 10 MG tablet TAKE (1) TABLET BY MOUTH DAILY. 08/21/14  Yes Jonelle SidleSamuel G McDowell, MD  atorvastatin (LIPITOR) 20 MG tablet Take 20 mg by mouth daily.   Yes Historical Provider, MD  carvedilol (COREG) 25 MG tablet Take 25 mg by mouth 2 (two) times daily with a meal.   Yes Historical Provider, MD  cephALEXin (KEFLEX) 500 MG capsule Take 500 mg by mouth 3 (three) times daily. 10 day course starting on 02/10/2015 02/10/15  Yes Historical Provider, MD  furosemide (LASIX) 40 MG tablet Take 40 mg by mouth daily.  11/08/14  Yes Historical Provider, MD  glimepiride (AMARYL) 2 MG tablet Take 2 mg by mouth daily.     Yes Historical Provider, MD  HYDROcodone-acetaminophen (NORCO/VICODIN) 5-325 MG tablet Take 1 tablet by mouth 4 (four) times daily.  09/10/14  Yes Historical Provider, MD  lisinopril (PRINIVIL,ZESTRIL) 40 MG tablet Take 40 mg by mouth 2 (two) times daily.     Yes Historical Provider, MD  metFORMIN (GLUCOPHAGE) 500 MG tablet Take 500 mg by mouth 2 (two) times daily with meals.  Yes Historical Provider, MD  solifenacin (VESICARE) 5 MG tablet Take 5 mg by mouth daily.   Yes Historical Provider, MD  TRAVATAN Z 0.004 % SOLN ophthalmic solution Place 1 drop into both eyes at bedtime.  11/08/14  Yes Historical Provider, MD  warfarin (COUMADIN) 5 MG  tablet Take 1 tablet daily or as directed Patient taking differently: Take 5 mg by mouth daily.  10/15/14  Yes Jonelle Sidle, MD     Allergies:  No Known Allergies  Social History:   reports that she has never smoked. She has never used smokeless tobacco. She reports that she does not drink alcohol or use illicit drugs.  Family History: Family History  Problem Relation Age of Onset  . Diabetes type II Mother   . Diabetes type II Father   . Hypertension Father      Physical Exam: Filed Vitals:   02/17/15 1830 02/17/15 1907 02/17/15 1930 02/17/15 2000  BP: 172/107 185/116 172/91 182/99  Pulse:  87 78 66  Temp:      TempSrc:      Resp: 18 16 18 16   Height:      Weight:      SpO2:  79% 92% 91%   Blood pressure 182/99, pulse 66, temperature 97.7 F (36.5 C), temperature source Oral, resp. rate 16, height 5\' 6"  (1.676 m), weight 71.215 kg (157 lb), SpO2 91 %.  GEN:  Pleasant  patient lying in the stretcher in no acute distress; cooperative with exam. PSYCH:  alert and oriented x4; does not appear anxious or depressed; affect is appropriate. HEENT: Mucous membranes pink and anicteric; PERRLA; EOM intact; no cervical lymphadenopathy nor thyromegaly or carotid bruit; no JVD; There were no stridor. Neck is very supple. Breasts:: Not examined CHEST WALL: No tenderness CHEST: Normal respiration, Bilateral rhales audible.  HEART: Regular rate and rhythm.  There are no murmur, rub, or gallops.   BACK: No kyphosis or scoliosis; no CVA tenderness ABDOMEN: soft and non-tender; no masses, no organomegaly, normal abdominal bowel sounds; no pannus; no intertriginous candida. There is no rebound and no distention. Rectal Exam: Not done EXTREMITIES: No bone or joint deformity; age-appropriate arthropathy of the hands and knees; 2+  edema; no ulcerations.  There is no calf tenderness. Genitalia: not examined PULSES: 2+ and symmetric SKIN: Normal hydration no rash or ulceration CNS:  Cranial nerves 2-12 grossly intact no focal lateralizing neurologic deficit.  Speech is fluent; uvula elevated with phonation, facial symmetry and tongue midline. DTR are normal bilaterally, cerebella exam is intact, barbinski is negative and strengths are equaled bilaterally.  No sensory loss.   Labs on Admission:  Basic Metabolic Panel:  Recent Labs Lab 02/17/15 1829  NA 145  K 3.8  CL 110  CO2 23  GLUCOSE 126*  BUN 21*  CREATININE 1.37*  CALCIUM 9.2    Recent Labs Lab 02/17/15 1759  WBC 8.6  NEUTROABS 5.4  HGB 11.3*  HCT 34.5*  MCV 97.5  PLT 235   Cardiac Enzymes:  Recent Labs Lab 02/17/15 1829  TROPONINI 0.05*    CBG: No results for input(s): GLUCAP in the last 168 hours.   Radiological Exams on Admission: Dg Chest 2 View  02/17/2015  CLINICAL DATA:  Shortness of breath, cough. EXAM: CHEST  2 VIEW COMPARISON:  September 12, 2008. FINDINGS: Stable cardiomegaly is noted. Increased central pulmonary vascular congestion is noted with mild bilateral pulmonary edema. No pneumothorax or pleural effusion is noted. Fluid appears to be in the  inferior portion of both major fissures. No pneumothorax is noted. Bony thorax is intact. IMPRESSION: Stable cardiomegaly is noted. Central pulmonary vascular congestion with mild bilateral pulmonary edema is noted. Electronically Signed   By: Lupita Raider, M.D.   On: 02/17/2015 18:30    EKG: Independently reviewed.   Assessment/Plan Present on Admission:  . Atrial fibrillation (HCC) . Essential hypertension, benign . Mixed hyperlipidemia . Tricuspid valve regurgitation  PLAN:   New onset of CHF:  I think she has HTN CMP, and a liitle volume overload.  I don't think she has ishemic CMP.  Will diurese her more, and obtain ECHO.  Check I/O and daily weight.  Continue with Coreg and statin.  AKI: No recent Cr to compare, but will decrease her Lisinopril from 40mg  per day down to 20mg  per day in hope of increasing GFR.  She benefit  having ACE I.    Afib:  Rate is very controlled.  Willl continue with Coumadin per pharmacy dosing.  TR:  Could be worsening, so will obtain ECHO.  DM:  Will d/c Metformin due to elevated Cr.  Will continue with SSI.    HTN:  Will continue with Norvasc, Coreg, and lower Lisinopril dose.  Follow Cr carefully.    Other plans as per orders. Code Status: FULL CODE (reconfirmed with her and daughter tonight.    Houston Siren, MD. FACP [facility] Pager [phone] 7pm to 7am.  02/17/2015, 8:55 PM

## 2015-02-17 NOTE — ED Provider Notes (Signed)
CSN: [id number]     Arrival date & time 02/17/15  1638 History   First MD Initiated Contact with Patient 02/17/15 1658     Chief Complaint  Patient presents with  . Cough     (Consider location/radiation/quality/duration/timing/severity/associated sxs/prior Treatment) HPI  The pt has hx of afib On coumadin - checked reg Sees Dr. Diona Browner 2 weeks prog SOB with cough and peripheral edema - on lasix and norvasc EMR reviewed - echo from 2010 - normal EF, no CHF, Cough, no fever, sx worsening, now severe, brought in by family.  Past Medical History  Diagnosis Date  . Essential hypertension, benign   . Atrial fibrillation (HCC)   . Type 2 diabetes mellitus (HCC)   . Mixed hyperlipidemia    Past Surgical History  Procedure Laterality Date  . Total hip arthroplasty  01/14/03    Left   Family History  Problem Relation Age of Onset  . Diabetes type II Mother   . Diabetes type II Father   . Hypertension Father    Social History  Substance Use Topics  . Smoking status: Never Smoker   . Smokeless tobacco: Never Used  . Alcohol Use: No   OB History    No data available     Review of Systems  All other systems reviewed and are negative.     Allergies  Review of patient's allergies indicates no known allergies.  Home Medications   Prior to Admission medications   Medication Sig Start Date End Date Taking? Authorizing Provider  amLODipine (NORVASC) 10 MG tablet TAKE (1) TABLET BY MOUTH DAILY. 08/21/14  Yes Jonelle Sidle, MD  atorvastatin (LIPITOR) 20 MG tablet Take 20 mg by mouth daily.   Yes Historical Provider, MD  carvedilol (COREG) 25 MG tablet Take 25 mg by mouth 2 (two) times daily with a meal.   Yes Historical Provider, MD  cephALEXin (KEFLEX) 500 MG capsule Take 500 mg by mouth 3 (three) times daily. 10 day course starting on 02/10/2015 02/10/15  Yes Historical Provider, MD  furosemide (LASIX) 40 MG tablet Take 40 mg by mouth daily.  11/08/14  Yes Historical  Provider, MD  glimepiride (AMARYL) 2 MG tablet Take 2 mg by mouth daily.     Yes Historical Provider, MD  HYDROcodone-acetaminophen (NORCO/VICODIN) 5-325 MG tablet Take 1 tablet by mouth 4 (four) times daily.  09/10/14  Yes Historical Provider, MD  lisinopril (PRINIVIL,ZESTRIL) 40 MG tablet Take 40 mg by mouth 2 (two) times daily.     Yes Historical Provider, MD  metFORMIN (GLUCOPHAGE) 500 MG tablet Take 500 mg by mouth 2 (two) times daily with meals.     Yes Historical Provider, MD  solifenacin (VESICARE) 5 MG tablet Take 5 mg by mouth daily.   Yes Historical Provider, MD  TRAVATAN Z 0.004 % SOLN ophthalmic solution Place 1 drop into both eyes at bedtime.  11/08/14  Yes Historical Provider, MD  warfarin (COUMADIN) 5 MG tablet Take 1 tablet daily or as directed Patient taking differently: Take 5 mg by mouth daily.  10/15/14  Yes Jonelle Sidle, MD   BP 182/99 mmHg  Pulse 66  Temp(Src) 97.7 F (36.5 C) (Oral)  Resp 16  Ht 5\' 6"  (1.676 m)  Wt 157 lb (71.215 kg)  BMI 25.35 kg/m2  SpO2 91% Physical Exam  Constitutional: She appears well-developed and well-nourished. She appears distressed.  HENT:  Head: Normocephalic and atraumatic.  Mouth/Throat: Oropharynx is clear and moist. No oropharyngeal exudate.  Eyes:  Conjunctivae and EOM are normal. Pupils are equal, round, and reactive to light. Right eye exhibits no discharge. Left eye exhibits no discharge. No scleral icterus.  Neck: Normal range of motion. Neck supple. No JVD present. No thyromegaly present.  Cardiovascular: Normal rate, normal heart sounds and intact distal pulses.  Exam reveals no gallop and no friction rub.   No murmur heard. irreg rhythm, JVD present  Pulmonary/Chest: She is in respiratory distress. She has wheezes. She has rales.  Abdominal: Soft. Bowel sounds are normal. She exhibits no distension and no mass. There is no tenderness.  Musculoskeletal: Normal range of motion. She exhibits edema ( severe bil pitting edema).  She exhibits no tenderness.  Lymphadenopathy:    She has no cervical adenopathy.  Neurological: She is alert. Coordination normal.  Skin: Skin is warm and dry. No rash noted. No erythema.  Psychiatric: She has a normal mood and affect. Her behavior is normal.  Nursing note and vitals reviewed.   ED Course  Procedures (including critical care time) Labs Review Labs Reviewed  BRAIN NATRIURETIC PEPTIDE - Abnormal; Notable for the following:    B Natriuretic Peptide 982.0 (*)    All other components within normal limits  CBC WITH DIFFERENTIAL/PLATELET - Abnormal; Notable for the following:    RBC 3.54 (*)    Hemoglobin 11.3 (*)    HCT 34.5 (*)    All other components within normal limits  BASIC METABOLIC PANEL - Abnormal; Notable for the following:    Glucose, Bld 126 (*)    BUN 21 (*)    Creatinine, Ser 1.37 (*)    GFR calc non Af Amer 35 (*)    GFR calc Af Amer 40 (*)    All other components within normal limits  TROPONIN I - Abnormal; Notable for the following:    Troponin I 0.05 (*)    All other components within normal limits  PROTIME-INR - Abnormal; Notable for the following:    Prothrombin Time 26.6 (*)    INR 2.49 (*)    All other components within normal limits  APTT - Abnormal; Notable for the following:    aPTT 42 (*)    All other components within normal limits    Imaging Review Dg Chest 2 View  02/17/2015  CLINICAL DATA:  Shortness of breath, cough. EXAM: CHEST  2 VIEW COMPARISON:  September 12, 2008. FINDINGS: Stable cardiomegaly is noted. Increased central pulmonary vascular congestion is noted with mild bilateral pulmonary edema. No pneumothorax or pleural effusion is noted. Fluid appears to be in the inferior portion of both major fissures. No pneumothorax is noted. Bony thorax is intact. IMPRESSION: Stable cardiomegaly is noted. Central pulmonary vascular congestion with mild bilateral pulmonary edema is noted. Electronically Signed   By: Lupita Raider, M.D.    On: 02/17/2015 18:30   I have personally reviewed and evaluated these images and lab results as part of my medical decision-making.  ED ECG REPORT  I personally interpreted this EKG   Date: 02/17/2015   Rate: 66  Rhythm: atrial fibrillation  QRS Axis: normal  Intervals: normal  ST/T Wave abnormalities: nonspecific T wave changes  Conduction Disutrbances:none  Narrative Interpretation: low voltage, twa old  Old EKG Reviewed: unchanged   MDM   Final diagnoses:  Acute systolic congestive heart failure (HCC)    Concern for new onset CHF, possible amlodipine swelling edema, labs, xray, r/o pna / no hx of COPD - suspect cardiac wheeze.  CXR - cardiomegaly  and pulm edema - lasix with improvement Labs reviewed  I have personally viewed and interpreted the imaging and agree with radiologist interpretation.  D//w Dr. Conley RollsLe - will admit for new onset CHF  Meds given in ED:  Medications  furosemide (LASIX) injection 40 mg (40 mg Intravenous Given 02/17/15 1759)    New Prescriptions   No medications on file        Eber HongBrian Sakara Lehtinen, MD 02/17/15 2048

## 2015-02-17 NOTE — Progress Notes (Signed)
ANTICOAGULATION CONSULT NOTE - Initial Consult  Pharmacy Consult for Coumadin Indication: atrial fibrillation  No Known Allergies  Patient Measurements: Height: 5\' 6"  (167.6 cm) Weight: 157 lb (71.215 kg) IBW/kg (Calculated) : 59.3  Vital Signs: Temp: 98.1 F (36.7 C) (01/10 2202) Temp Source: Oral (01/10 2202) BP: 155/71 mmHg (01/10 2202) Pulse Rate: 70 (01/10 2202)  Labs:  Recent Labs  02/17/15 1759 02/17/15 1829  HGB 11.3*  --   HCT 34.5*  --   PLT 235  --   APTT  --  42*  LABPROT  --  26.6*  INR  --  2.49*  CREATININE  --  1.37*  TROPONINI  --  0.05*    Estimated Creatinine Clearance: 32 mL/min (by C-G formula based on Cr of 1.37).   Medical History: Past Medical History  Diagnosis Date  . Essential hypertension, benign   . Atrial fibrillation (HCC)   . Type 2 diabetes mellitus (HCC)   . Mixed hyperlipidemia     Medications:   Prescriptions prior to admission  Medication Sig Dispense Refill Last Dose  . amLODipine (NORVASC) 10 MG tablet TAKE (1) TABLET BY MOUTH DAILY. 30 tablet 6 02/17/2015 at Unknown time  . atorvastatin (LIPITOR) 20 MG tablet Take 20 mg by mouth daily.   02/17/2015 at Unknown time  . carvedilol (COREG) 25 MG tablet Take 25 mg by mouth 2 (two) times daily with a meal.   02/17/2015 at Unknown time  . cephALEXin (KEFLEX) 500 MG capsule Take 500 mg by mouth 3 (three) times daily. 10 day course starting on 02/10/2015   02/17/2015 at Unknown time  . furosemide (LASIX) 40 MG tablet Take 40 mg by mouth daily.    02/17/2015 at Unknown time  . glimepiride (AMARYL) 2 MG tablet Take 2 mg by mouth daily.     02/17/2015 at Unknown time  . HYDROcodone-acetaminophen (NORCO/VICODIN) 5-325 MG tablet Take 1 tablet by mouth 4 (four) times daily.    02/17/2015 at Unknown time  . lisinopril (PRINIVIL,ZESTRIL) 40 MG tablet Take 40 mg by mouth 2 (two) times daily.     02/17/2015 at Unknown time  . metFORMIN (GLUCOPHAGE) 500 MG tablet Take 500 mg by mouth 2 (two) times  daily with meals.     02/17/2015 at Unknown time  . solifenacin (VESICARE) 5 MG tablet Take 5 mg by mouth daily.   02/17/2015 at Unknown time  . TRAVATAN Z 0.004 % SOLN ophthalmic solution Place 1 drop into both eyes at bedtime.    02/16/2015 at Unknown time  . warfarin (COUMADIN) 5 MG tablet Take 1 tablet daily or as directed (Patient taking differently: Take 5 mg by mouth daily. ) 45 tablet 3 02/16/2015 at 1800    Assessment: 82 yo female on chronic anticoagulation for afib. Pt admitted for new onset of CHF. Plan to continue with couamdin. INR therapeutic.  Goal of Therapy:  INR 2-3 Monitor platelets by anticoagulation protocol: Yes   Plan:  Coumadin 5mg  po today Monitor for S/S of bleeding PT/INR daily  Elder CyphersLorie Khaleef Ruby, BS Loura BackPharm D, BCPS Clinical Pharmacist Pager [phone] 02/17/2015,10:27 PM

## 2015-02-17 NOTE — ED Notes (Signed)
MD at bedside. 

## 2015-02-17 NOTE — ED Notes (Signed)
Patient complains of cough with periods of shortness of breath while coughing. States general malaise. Denies n/v/d. NAD.

## 2015-02-18 ENCOUNTER — Inpatient Hospital Stay (HOSPITAL_COMMUNITY): Payer: Medicare HMO

## 2015-02-18 DIAGNOSIS — R0600 Dyspnea, unspecified: Secondary | ICD-10-CM

## 2015-02-18 LAB — GLUCOSE, CAPILLARY
GLUCOSE-CAPILLARY: 117 mg/dL — AB (ref 65–99)
GLUCOSE-CAPILLARY: 139 mg/dL — AB (ref 65–99)
Glucose-Capillary: 147 mg/dL — ABNORMAL HIGH (ref 65–99)
Glucose-Capillary: 152 mg/dL — ABNORMAL HIGH (ref 65–99)

## 2015-02-18 LAB — BASIC METABOLIC PANEL
ANION GAP: 6 (ref 5–15)
BUN: 19 mg/dL (ref 6–20)
CALCIUM: 8.7 mg/dL — AB (ref 8.9–10.3)
CO2: 29 mmol/L (ref 22–32)
CREATININE: 1.34 mg/dL — AB (ref 0.44–1.00)
Chloride: 110 mmol/L (ref 101–111)
GFR, EST AFRICAN AMERICAN: 42 mL/min — AB (ref >60)
GFR, EST NON AFRICAN AMERICAN: 36 mL/min — AB (ref >60)
GLUCOSE: 125 mg/dL — AB (ref 65–99)
Potassium: 3.6 mmol/L (ref 3.5–5.1)
Sodium: 145 mmol/L (ref 135–145)

## 2015-02-18 LAB — PROTIME-INR
INR: 2.57 — AB (ref 0.00–1.49)
Prothrombin Time: 27.2 seconds — ABNORMAL HIGH (ref 11.6–15.2)

## 2015-02-18 MED ORDER — WARFARIN - PHARMACIST DOSING INPATIENT
Status: DC
  Administered 2015-02-18 – 2015-02-25 (×7)

## 2015-02-18 MED ORDER — WARFARIN SODIUM 5 MG PO TABS
5.0000 mg | ORAL_TABLET | Freq: Once | ORAL | Status: AC
  Administered 2015-02-18: 5 mg via ORAL
  Filled 2015-02-18: qty 1

## 2015-02-18 MED ORDER — HYDROCODONE-HOMATROPINE 5-1.5 MG/5ML PO SYRP
5.0000 mL | ORAL_SOLUTION | ORAL | Status: AC | PRN
  Administered 2015-02-18 – 2015-02-20 (×6): 5 mL via ORAL
  Filled 2015-02-18 (×8): qty 5

## 2015-02-18 NOTE — Progress Notes (Signed)
ANTICOAGULATION CONSULT NOTE   Pharmacy Consult for Coumadin Indication: atrial fibrillation  No Known Allergies  Patient Measurements: Height: 5\' 6"  (167.6 cm) Weight: 174 lb 4.8 oz (79.062 kg) IBW/kg (Calculated) : 59.3  Vital Signs: BP: 164/62 mmHg (01/11 1020) Pulse Rate: 61 (01/11 0652)  Labs:  Recent Labs  02/17/15 1759 02/17/15 1829 02/18/15 0550  HGB 11.3*  --   --   HCT 34.5*  --   --   PLT 235  --   --   APTT  --  42*  --   LABPROT  --  26.6* 27.2*  INR  --  2.49* 2.57*  CREATININE  --  1.37* 1.34*  TROPONINI  --  0.05*  --     Estimated Creatinine Clearance: 34.3 mL/min (by C-G formula based on Cr of 1.34).   Medical History: Past Medical History  Diagnosis Date  . Essential hypertension, benign   . Atrial fibrillation (HCC)   . Type 2 diabetes mellitus (HCC)   . Mixed hyperlipidemia     Medications:   Prescriptions prior to admission  Medication Sig Dispense Refill Last Dose  . amLODipine (NORVASC) 10 MG tablet TAKE (1) TABLET BY MOUTH DAILY. 30 tablet 6 02/17/2015 at Unknown time  . atorvastatin (LIPITOR) 20 MG tablet Take 20 mg by mouth daily.   02/17/2015 at Unknown time  . carvedilol (COREG) 25 MG tablet Take 25 mg by mouth 2 (two) times daily with a meal.   02/17/2015 at Unknown time  . cephALEXin (KEFLEX) 500 MG capsule Take 500 mg by mouth 3 (three) times daily. 10 day course starting on 02/10/2015   02/17/2015 at Unknown time  . furosemide (LASIX) 40 MG tablet Take 40 mg by mouth daily.    02/17/2015 at Unknown time  . glimepiride (AMARYL) 2 MG tablet Take 2 mg by mouth daily.     02/17/2015 at Unknown time  . HYDROcodone-acetaminophen (NORCO/VICODIN) 5-325 MG tablet Take 1 tablet by mouth 4 (four) times daily.    02/17/2015 at Unknown time  . lisinopril (PRINIVIL,ZESTRIL) 40 MG tablet Take 40 mg by mouth 2 (two) times daily.     02/17/2015 at Unknown time  . metFORMIN (GLUCOPHAGE) 500 MG tablet Take 500 mg by mouth 2 (two) times daily with meals.      02/17/2015 at Unknown time  . solifenacin (VESICARE) 5 MG tablet Take 5 mg by mouth daily.   02/17/2015 at Unknown time  . TRAVATAN Z 0.004 % SOLN ophthalmic solution Place 1 drop into both eyes at bedtime.    02/16/2015 at Unknown time  . warfarin (COUMADIN) 5 MG tablet Take 1 tablet daily or as directed (Patient taking differently: Take 5 mg by mouth daily. ) 45 tablet 3 02/16/2015 at 1800   Assessment: 82 yo female on chronic anticoagulation for afib. Pt admitted for new onset of CHF. Plan to continue with couamdin. INR therapeutic.  Goal of Therapy:  INR 2-3 Monitor platelets by anticoagulation protocol: Yes   Plan:  Coumadin 5mg  po today Monitor for S/S of bleeding PT/INR daily  Riko Lumsden Berenice PrimasHall Pharm D,  Clinical Pharmacist  02/18/2015,12:40 PM

## 2015-02-18 NOTE — Progress Notes (Signed)
Subjective: She came in yesterday with heart failure. She says she feels better and has less edema  Objective: Vital signs in last 24 hours: Temp:  [97.7 F (36.5 C)-98.1 F (36.7 C)] 98.1 F (36.7 C) (01/10 2202) Pulse Rate:  [61-87] 61 (01/11 0652) Resp:  [16-31] 20 (01/11 0652) BP: (151-185)/(69-116) 151/69 mmHg (01/11 0652) SpO2:  [79 %-100 %] 99 % (01/11 0652) Weight:  [71.215 kg (157 lb)-80.468 kg (177 lb 6.4 oz)] 79.062 kg (174 lb 4.8 oz) (01/11 3295) Weight change:  Last BM Date: 02/16/15  Intake/Output from previous day:    PHYSICAL EXAM General appearance: alert, cooperative and mild distress Resp: clear to auscultation bilaterally Cardio: irregularly irregular rhythm  Lab Results:  Results for orders placed or performed during the hospital encounter of 02/17/15 (from the past 48 hour(s))  Brain natriuretic peptide     Status: Abnormal   Collection Time: 02/17/15  5:59 PM  Result Value Ref Range   B Natriuretic Peptide 982.0 (H) 0.0 - 100.0 pg/mL  CBC with Differential/Platelet     Status: Abnormal   Collection Time: 02/17/15  5:59 PM  Result Value Ref Range   WBC 8.6 4.0 - 10.5 K/uL   RBC 3.54 (L) 3.87 - 5.11 MIL/uL   Hemoglobin 11.3 (L) 12.0 - 15.0 g/dL   HCT 34.5 (L) 36.0 - 46.0 %   MCV 97.5 78.0 - 100.0 fL   MCH 31.9 26.0 - 34.0 pg   MCHC 32.8 30.0 - 36.0 g/dL   RDW 15.0 11.5 - 15.5 %   Platelets 235 150 - 400 K/uL   Neutrophils Relative % 62 %   Neutro Abs 5.4 1.7 - 7.7 K/uL   Lymphocytes Relative 26 %   Lymphs Abs 2.2 0.7 - 4.0 K/uL   Monocytes Relative 8 %   Monocytes Absolute 0.7 0.1 - 1.0 K/uL   Eosinophils Relative 3 %   Eosinophils Absolute 0.2 0.0 - 0.7 K/uL   Basophils Relative 1 %   Basophils Absolute 0.1 0.0 - 0.1 K/uL  Basic metabolic panel     Status: Abnormal   Collection Time: 02/17/15  6:29 PM  Result Value Ref Range   Sodium 145 135 - 145 mmol/L   Potassium 3.8 3.5 - 5.1 mmol/L   Chloride 110 101 - 111 mmol/L   CO2 23 22 - 32  mmol/L   Glucose, Bld 126 (H) 65 - 99 mg/dL   BUN 21 (H) 6 - 20 mg/dL   Creatinine, Ser 1.37 (H) 0.44 - 1.00 mg/dL   Calcium 9.2 8.9 - 10.3 mg/dL   GFR calc non Af Amer 35 (L) >60 mL/min   GFR calc Af Amer 40 (L) >60 mL/min    Comment: (NOTE) The eGFR has been calculated using the CKD EPI equation. This calculation has not been validated in all clinical situations. eGFR's persistently <60 mL/min signify possible Chronic Kidney Disease.    Anion gap 12 5 - 15  Troponin I     Status: Abnormal   Collection Time: 02/17/15  6:29 PM  Result Value Ref Range   Troponin I 0.05 (H) <0.031 ng/mL    Comment:        PERSISTENTLY INCREASED TROPONIN VALUES IN THE RANGE OF 0.04-0.49 ng/mL CAN BE SEEN IN:       -UNSTABLE ANGINA       -CONGESTIVE HEART FAILURE       -MYOCARDITIS       -CHEST TRAUMA       -ARRYHTHMIAS       -  LATE PRESENTING MYOCARDIAL INFARCTION       -COPD   CLINICAL FOLLOW-UP RECOMMENDED.   Protime-INR     Status: Abnormal   Collection Time: 02/17/15  6:29 PM  Result Value Ref Range   Prothrombin Time 26.6 (H) 11.6 - 15.2 seconds   INR 2.49 (H) 0.00 - 1.49  APTT     Status: Abnormal   Collection Time: 02/17/15  6:29 PM  Result Value Ref Range   aPTT 42 (H) 24 - 37 seconds    Comment:        IF BASELINE aPTT IS ELEVATED, SUGGEST PATIENT RISK ASSESSMENT BE USED TO DETERMINE APPROPRIATE ANTICOAGULANT THERAPY.   TSH     Status: None   Collection Time: 02/17/15  6:29 PM  Result Value Ref Range   TSH 3.793 0.350 - 4.500 uIU/mL  Glucose, capillary     Status: Abnormal   Collection Time: 02/17/15 11:27 PM  Result Value Ref Range   Glucose-Capillary 183 (H) 65 - 99 mg/dL  Basic metabolic panel     Status: Abnormal   Collection Time: 02/18/15  5:50 AM  Result Value Ref Range   Sodium 145 135 - 145 mmol/L   Potassium 3.6 3.5 - 5.1 mmol/L   Chloride 110 101 - 111 mmol/L   CO2 29 22 - 32 mmol/L   Glucose, Bld 125 (H) 65 - 99 mg/dL   BUN 19 6 - 20 mg/dL   Creatinine,  Ser 1.34 (H) 0.44 - 1.00 mg/dL   Calcium 8.7 (L) 8.9 - 10.3 mg/dL   GFR calc non Af Amer 36 (L) >60 mL/min   GFR calc Af Amer 42 (L) >60 mL/min    Comment: (NOTE) The eGFR has been calculated using the CKD EPI equation. This calculation has not been validated in all clinical situations. eGFR's persistently <60 mL/min signify possible Chronic Kidney Disease.    Anion gap 6 5 - 15  Protime-INR     Status: Abnormal   Collection Time: 02/18/15  5:50 AM  Result Value Ref Range   Prothrombin Time 27.2 (H) 11.6 - 15.2 seconds   INR 2.57 (H) 0.00 - 1.49  Glucose, capillary     Status: Abnormal   Collection Time: 02/18/15  7:41 AM  Result Value Ref Range   Glucose-Capillary 117 (H) 65 - 99 mg/dL    ABGS No results for input(s): PHART, PO2ART, TCO2, HCO3 in the last 72 hours.  Invalid input(s): PCO2 CULTURES No results found for this or any previous visit (from the past 240 hour(s)). Studies/Results: Dg Chest 2 View  02/17/2015  CLINICAL DATA:  Shortness of breath, cough. EXAM: CHEST  2 VIEW COMPARISON:  September 12, 2008. FINDINGS: Stable cardiomegaly is noted. Increased central pulmonary vascular congestion is noted with mild bilateral pulmonary edema. No pneumothorax or pleural effusion is noted. Fluid appears to be in the inferior portion of both major fissures. No pneumothorax is noted. Bony thorax is intact. IMPRESSION: Stable cardiomegaly is noted. Central pulmonary vascular congestion with mild bilateral pulmonary edema is noted. Electronically Signed   By: Marijo Conception, M.D.   On: 02/17/2015 18:30    Medications:  Prior to Admission:  Prescriptions prior to admission  Medication Sig Dispense Refill Last Dose  . amLODipine (NORVASC) 10 MG tablet TAKE (1) TABLET BY MOUTH DAILY. 30 tablet 6 02/17/2015 at Unknown time  . atorvastatin (LIPITOR) 20 MG tablet Take 20 mg by mouth daily.   02/17/2015 at Unknown time  . carvedilol (COREG) 25  MG tablet Take 25 mg by mouth 2 (two) times daily  with a meal.   02/17/2015 at Unknown time  . cephALEXin (KEFLEX) 500 MG capsule Take 500 mg by mouth 3 (three) times daily. 10 day course starting on 02/10/2015   02/17/2015 at Unknown time  . furosemide (LASIX) 40 MG tablet Take 40 mg by mouth daily.    02/17/2015 at Unknown time  . glimepiride (AMARYL) 2 MG tablet Take 2 mg by mouth daily.     02/17/2015 at Unknown time  . HYDROcodone-acetaminophen (NORCO/VICODIN) 5-325 MG tablet Take 1 tablet by mouth 4 (four) times daily.    02/17/2015 at Unknown time  . lisinopril (PRINIVIL,ZESTRIL) 40 MG tablet Take 40 mg by mouth 2 (two) times daily.     02/17/2015 at Unknown time  . metFORMIN (GLUCOPHAGE) 500 MG tablet Take 500 mg by mouth 2 (two) times daily with meals.     02/17/2015 at Unknown time  . solifenacin (VESICARE) 5 MG tablet Take 5 mg by mouth daily.   02/17/2015 at Unknown time  . TRAVATAN Z 0.004 % SOLN ophthalmic solution Place 1 drop into both eyes at bedtime.    02/16/2015 at Unknown time  . warfarin (COUMADIN) 5 MG tablet Take 1 tablet daily or as directed (Patient taking differently: Take 5 mg by mouth daily. ) 45 tablet 3 02/16/2015 at 1800   Scheduled: . amLODipine  10 mg Oral Daily  . antiseptic oral rinse  7 mL Mouth Rinse BID  . atorvastatin  20 mg Oral q1800  . carvedilol  25 mg Oral BID WC  . darifenacin  7.5 mg Oral Daily  . furosemide  40 mg Intravenous BID  . glimepiride  2 mg Oral Daily  . insulin aspart  0-5 Units Subcutaneous QHS  . insulin aspart  0-9 Units Subcutaneous TID WC  . latanoprost  1 drop Both Eyes QHS  . lisinopril  20 mg Oral BID  . sodium chloride  3 mL Intravenous Q12H  . Warfarin - Pharmacist Dosing Inpatient   Does not apply q1800   Continuous:  [id number] **OR** acetaminophen, ondansetron **OR** ondansetron (ZOFRAN) IV  Assesment: She has heart failure. She has chronic atrial fibrillation on chronic anticoagulation. She has improved but still has edema Principal Problem:   CHF (congestive heart  failure) (HCC) Active Problems:   Mixed hyperlipidemia   Essential hypertension, benign   Atrial fibrillation ([place])   Long term current use of anticoagulant   Tricuspid valve regurgitation    Plan: Check echocardiogram. Continue with IV diuresis.    LOS: 1 day   Chaniah Cisse L 02/18/2015, 9:54 AM

## 2015-02-18 NOTE — Care Management Note (Signed)
Case Management Note  Patient Details  Name: Melissa [name] MRN: [id number] Date of Birth: 06-05-1930  Subjective/Objective:                  Pt admitted from home with CHF. Pt lives alone and will return home at discharge. Pt has a cane for prn use. Pt is fairly independent with ADL's.  Action/Plan: Will continue to follow for discharge planning needs. [facility] Pt will need home O2 assessment prior to discharge.  Expected Discharge Date:  02/20/15               Expected Discharge Plan:  Home w Home Health Services  In-House Referral:  NA  Discharge planning Services  CM Consult  Post Acute Care Choice:  Home Health Choice offered to:  Patient  DME Arranged:    DME Agency:     HH Arranged:  RN HH Agency:     Status of Service:  In process, will continue to follow  Medicare Important Message Given:    Date Medicare IM Given:    Medicare IM give by:    Date Additional Medicare IM Given:    Additional Medicare Important Message give by:     If discussed at Long Length of Stay Meetings, dates discussed:    Additional Comments:  Cheryl FlashBlackwell, Palma Buster Crowder, RN 02/18/2015, 11:21 AM

## 2015-02-18 NOTE — Care Management Note (Signed)
Case Management Note  Patient Details  Name: Melissa [name] MRN: [id number] Date of Birth: 1930-10-07  Subjective/Objective:                    Action/Plan:   Expected Discharge Date:  02/20/15               Expected Discharge Plan:  Home w Home Health Services  In-House Referral:  NA  Discharge planning Services  CM Consult  Post Acute Care Choice:  Home Health Choice offered to:  Patient  DME Arranged:    DME Agency:     HH Arranged:  RN HH Agency:     Status of Service:  In process, will continue to follow  Medicare Important Message Given:    Date Medicare IM Given:    Medicare IM give by:    Date Additional Medicare IM Given:    Additional Medicare Important Message give by:     If discussed at Long Length of Stay Meetings, dates discussed:    Additional Comments: CM educated pt on diet and daily weights. Pt stated she has a set of scales.  Arlyss QueenBlackwell, [facility] Rupertrowder, RN 02/18/2015, 11:23 AM

## 2015-02-18 NOTE — Progress Notes (Signed)
Patient coughing, stated Dr. [name] was ordering something for cough, paged at office, returned call and ordered Hycodan 5ml q4h prn cough, order entered.

## 2015-02-19 DIAGNOSIS — I872 Venous insufficiency (chronic) (peripheral): Secondary | ICD-10-CM | POA: Diagnosis present

## 2015-02-19 LAB — BASIC METABOLIC PANEL
ANION GAP: 7 (ref 5–15)
BUN: 19 mg/dL (ref 6–20)
CALCIUM: 8.3 mg/dL — AB (ref 8.9–10.3)
CO2: 30 mmol/L (ref 22–32)
CREATININE: 1.38 mg/dL — AB (ref 0.44–1.00)
Chloride: 106 mmol/L (ref 101–111)
GFR calc Af Amer: 40 mL/min — ABNORMAL LOW (ref >60)
GFR, EST NON AFRICAN AMERICAN: 35 mL/min — AB (ref >60)
GLUCOSE: 119 mg/dL — AB (ref 65–99)
Potassium: 3.3 mmol/L — ABNORMAL LOW (ref 3.5–5.1)
Sodium: 143 mmol/L (ref 135–145)

## 2015-02-19 LAB — GLUCOSE, CAPILLARY
GLUCOSE-CAPILLARY: 128 mg/dL — AB (ref 65–99)
Glucose-Capillary: 111 mg/dL — ABNORMAL HIGH (ref 65–99)
Glucose-Capillary: 168 mg/dL — ABNORMAL HIGH (ref 65–99)
Glucose-Capillary: 106 mg/dL — ABNORMAL HIGH (ref 65–99)

## 2015-02-19 LAB — PROTIME-INR
INR: 2.88 — ABNORMAL HIGH (ref 0.00–1.49)
Prothrombin Time: 29.7 seconds — ABNORMAL HIGH (ref 11.6–15.2)

## 2015-02-19 MED ORDER — WARFARIN SODIUM 2 MG PO TABS
4.0000 mg | ORAL_TABLET | Freq: Once | ORAL | Status: AC
  Administered 2015-02-19: 4 mg via ORAL
  Filled 2015-02-19: qty 2

## 2015-02-19 MED ORDER — POTASSIUM CHLORIDE CRYS ER 20 MEQ PO TBCR
20.0000 meq | EXTENDED_RELEASE_TABLET | Freq: Two times a day (BID) | ORAL | Status: DC
  Administered 2015-02-19 (×2): 20 meq via ORAL
  Filled 2015-02-19 (×2): qty 1

## 2015-02-19 NOTE — Progress Notes (Signed)
Subjective: She feels better. She has less problem with swelling. She has some venous insufficiency as well. She's been bradycardic some overnight.   Objective: Vital signs in last 24 hours: Temp:  [97.9 F (36.6 C)-98.9 F (37.2 C)] 98.9 F (37.2 C) (01/12 0654) Pulse Rate:  [51-93] 51 (01/12 0654) Resp:  [18-20] 18 (01/12 0654) BP: (138-164)/(62-81) 157/71 mmHg (01/12 0654) SpO2:  [94 %-98 %] 98 % (01/12 0654) Weight:  [77.837 kg (171 lb 9.6 oz)] 77.837 kg (171 lb 9.6 oz) (01/12 0654) Weight change: 6.623 kg (14 lb 9.6 oz) Last BM Date: 02/16/15  Intake/Output from previous day: 01/11 0701 - 01/12 0700 In: 603 [P.O.:600; I.V.:3] Out: 2350 [Urine:2350]  PHYSICAL EXAM General appearance: alert, cooperative and no distress Resp: clear to auscultation bilaterally Cardio: irregularly irregular rhythm GI: soft, non-tender; bowel sounds normal; no masses,  no organomegaly Extremities: She still has edema of both legs  Lab Results:  Results for orders placed or performed during the hospital encounter of 02/17/15 (from the past 48 hour(s))  Brain natriuretic peptide     Status: Abnormal   Collection Time: 02/17/15  5:59 PM  Result Value Ref Range   B Natriuretic Peptide 982.0 (H) 0.0 - 100.0 pg/mL  CBC with Differential/Platelet     Status: Abnormal   Collection Time: 02/17/15  5:59 PM  Result Value Ref Range   WBC 8.6 4.0 - 10.5 K/uL   RBC 3.54 (L) 3.87 - 5.11 MIL/uL   Hemoglobin 11.3 (L) 12.0 - 15.0 g/dL   HCT 34.5 (L) 36.0 - 46.0 %   MCV 97.5 78.0 - 100.0 fL   MCH 31.9 26.0 - 34.0 pg   MCHC 32.8 30.0 - 36.0 g/dL   RDW 15.0 11.5 - 15.5 %   Platelets 235 150 - 400 K/uL   Neutrophils Relative % 62 %   Neutro Abs 5.4 1.7 - 7.7 K/uL   Lymphocytes Relative 26 %   Lymphs Abs 2.2 0.7 - 4.0 K/uL   Monocytes Relative 8 %   Monocytes Absolute 0.7 0.1 - 1.0 K/uL   Eosinophils Relative 3 %   Eosinophils Absolute 0.2 0.0 - 0.7 K/uL   Basophils Relative 1 %   Basophils Absolute  0.1 0.0 - 0.1 K/uL  Basic metabolic panel     Status: Abnormal   Collection Time: 02/17/15  6:29 PM  Result Value Ref Range   Sodium 145 135 - 145 mmol/L   Potassium 3.8 3.5 - 5.1 mmol/L   Chloride 110 101 - 111 mmol/L   CO2 23 22 - 32 mmol/L   Glucose, Bld 126 (H) 65 - 99 mg/dL   BUN 21 (H) 6 - 20 mg/dL   Creatinine, Ser 1.37 (H) 0.44 - 1.00 mg/dL   Calcium 9.2 8.9 - 10.3 mg/dL   GFR calc non Af Amer 35 (L) >60 mL/min   GFR calc Af Amer 40 (L) >60 mL/min    Comment: (NOTE) The eGFR has been calculated using the CKD EPI equation. This calculation has not been validated in all clinical situations. eGFR's persistently <60 mL/min signify possible Chronic Kidney Disease.    Anion gap 12 5 - 15  Troponin I     Status: Abnormal   Collection Time: 02/17/15  6:29 PM  Result Value Ref Range   Troponin I 0.05 (H) <0.031 ng/mL    Comment:        PERSISTENTLY INCREASED TROPONIN VALUES IN THE RANGE OF 0.04-0.49 ng/mL CAN BE SEEN IN:       -  UNSTABLE ANGINA       -CONGESTIVE HEART FAILURE       -MYOCARDITIS       -CHEST TRAUMA       -ARRYHTHMIAS       -LATE PRESENTING MYOCARDIAL INFARCTION       -COPD   CLINICAL FOLLOW-UP RECOMMENDED.   Protime-INR     Status: Abnormal   Collection Time: 02/17/15  6:29 PM  Result Value Ref Range   Prothrombin Time 26.6 (H) 11.6 - 15.2 seconds   INR 2.49 (H) 0.00 - 1.49  APTT     Status: Abnormal   Collection Time: 02/17/15  6:29 PM  Result Value Ref Range   aPTT 42 (H) 24 - 37 seconds    Comment:        IF BASELINE aPTT IS ELEVATED, SUGGEST PATIENT RISK ASSESSMENT BE USED TO DETERMINE APPROPRIATE ANTICOAGULANT THERAPY.   TSH     Status: None   Collection Time: 02/17/15  6:29 PM  Result Value Ref Range   TSH 3.793 0.350 - 4.500 uIU/mL  Glucose, capillary     Status: Abnormal   Collection Time: 02/17/15 11:27 PM  Result Value Ref Range   Glucose-Capillary 183 (H) 65 - 99 mg/dL  Basic metabolic panel     Status: Abnormal   Collection Time:  02/18/15  5:50 AM  Result Value Ref Range   Sodium 145 135 - 145 mmol/L   Potassium 3.6 3.5 - 5.1 mmol/L   Chloride 110 101 - 111 mmol/L   CO2 29 22 - 32 mmol/L   Glucose, Bld 125 (H) 65 - 99 mg/dL   BUN 19 6 - 20 mg/dL   Creatinine, Ser 1.34 (H) 0.44 - 1.00 mg/dL   Calcium 8.7 (L) 8.9 - 10.3 mg/dL   GFR calc non Af Amer 36 (L) >60 mL/min   GFR calc Af Amer 42 (L) >60 mL/min    Comment: (NOTE) The eGFR has been calculated using the CKD EPI equation. This calculation has not been validated in all clinical situations. eGFR's persistently <60 mL/min signify possible Chronic Kidney Disease.    Anion gap 6 5 - 15  Protime-INR     Status: Abnormal   Collection Time: 02/18/15  5:50 AM  Result Value Ref Range   Prothrombin Time 27.2 (H) 11.6 - 15.2 seconds   INR 2.57 (H) 0.00 - 1.49  Glucose, capillary     Status: Abnormal   Collection Time: 02/18/15  7:41 AM  Result Value Ref Range   Glucose-Capillary 117 (H) 65 - 99 mg/dL  Glucose, capillary     Status: Abnormal   Collection Time: 02/18/15 11:46 AM  Result Value Ref Range   Glucose-Capillary 147 (H) 65 - 99 mg/dL  Glucose, capillary     Status: Abnormal   Collection Time: 02/18/15  4:32 PM  Result Value Ref Range   Glucose-Capillary 139 (H) 65 - 99 mg/dL  Glucose, capillary     Status: Abnormal   Collection Time: 02/18/15  8:50 PM  Result Value Ref Range   Glucose-Capillary 152 (H) 65 - 99 mg/dL   Comment 1 Notify RN    Comment 2 Document in Chart   Basic metabolic panel     Status: Abnormal   Collection Time: 02/19/15  6:07 AM  Result Value Ref Range   Sodium 143 135 - 145 mmol/L   Potassium 3.3 (L) 3.5 - 5.1 mmol/L   Chloride 106 101 - 111 mmol/L   CO2 30 22 -  32 mmol/L   Glucose, Bld 119 (H) 65 - 99 mg/dL   BUN 19 6 - 20 mg/dL   Creatinine, Ser 1.38 (H) 0.44 - 1.00 mg/dL   Calcium 8.3 (L) 8.9 - 10.3 mg/dL   GFR calc non Af Amer 35 (L) >60 mL/min   GFR calc Af Amer 40 (L) >60 mL/min    Comment: (NOTE) The eGFR has  been calculated using the CKD EPI equation. This calculation has not been validated in all clinical situations. eGFR's persistently <60 mL/min signify possible Chronic Kidney Disease.    Anion gap 7 5 - 15  Protime-INR     Status: Abnormal   Collection Time: 02/19/15  6:07 AM  Result Value Ref Range   Prothrombin Time 29.7 (H) 11.6 - 15.2 seconds   INR 2.88 (H) 0.00 - 1.49  Glucose, capillary     Status: Abnormal   Collection Time: 02/19/15  7:35 AM  Result Value Ref Range   Glucose-Capillary 111 (H) 65 - 99 mg/dL    ABGS No results for input(s): PHART, PO2ART, TCO2, HCO3 in the last 72 hours.  Invalid input(s): PCO2 CULTURES No results found for this or any previous visit (from the past 240 hour(s)). Studies/Results: Dg Chest 2 View  02/17/2015  CLINICAL DATA:  Shortness of breath, cough. EXAM: CHEST  2 VIEW COMPARISON:  September 12, 2008. FINDINGS: Stable cardiomegaly is noted. Increased central pulmonary vascular congestion is noted with mild bilateral pulmonary edema. No pneumothorax or pleural effusion is noted. Fluid appears to be in the inferior portion of both major fissures. No pneumothorax is noted. Bony thorax is intact. IMPRESSION: Stable cardiomegaly is noted. Central pulmonary vascular congestion with mild bilateral pulmonary edema is noted. Electronically Signed   By: Marijo Conception, M.D.   On: 02/17/2015 18:30    Medications:  Prior to Admission:  Prescriptions prior to admission  Medication Sig Dispense Refill Last Dose  . amLODipine (NORVASC) 10 MG tablet TAKE (1) TABLET BY MOUTH DAILY. 30 tablet 6 02/17/2015 at Unknown time  . atorvastatin (LIPITOR) 20 MG tablet Take 20 mg by mouth daily.   02/17/2015 at Unknown time  . carvedilol (COREG) 25 MG tablet Take 25 mg by mouth 2 (two) times daily with a meal.   02/17/2015 at Unknown time  . cephALEXin (KEFLEX) 500 MG capsule Take 500 mg by mouth 3 (three) times daily. 10 day course starting on 02/10/2015   02/17/2015 at  Unknown time  . furosemide (LASIX) 40 MG tablet Take 40 mg by mouth daily.    02/17/2015 at Unknown time  . glimepiride (AMARYL) 2 MG tablet Take 2 mg by mouth daily.     02/17/2015 at Unknown time  . HYDROcodone-acetaminophen (NORCO/VICODIN) 5-325 MG tablet Take 1 tablet by mouth 4 (four) times daily.    02/17/2015 at Unknown time  . lisinopril (PRINIVIL,ZESTRIL) 40 MG tablet Take 40 mg by mouth 2 (two) times daily.     02/17/2015 at Unknown time  . metFORMIN (GLUCOPHAGE) 500 MG tablet Take 500 mg by mouth 2 (two) times daily with meals.     02/17/2015 at Unknown time  . solifenacin (VESICARE) 5 MG tablet Take 5 mg by mouth daily.   02/17/2015 at Unknown time  . TRAVATAN Z 0.004 % SOLN ophthalmic solution Place 1 drop into both eyes at bedtime.    02/16/2015 at Unknown time  . warfarin (COUMADIN) 5 MG tablet Take 1 tablet daily or as directed (Patient taking differently: Take 5 mg  by mouth daily. ) 45 tablet 3 02/16/2015 at 1800   Scheduled: . amLODipine  10 mg Oral Daily  . antiseptic oral rinse  7 mL Mouth Rinse BID  . atorvastatin  20 mg Oral q1800  . carvedilol  25 mg Oral BID WC  . darifenacin  7.5 mg Oral Daily  . furosemide  40 mg Intravenous BID  . glimepiride  2 mg Oral Daily  . insulin aspart  0-5 Units Subcutaneous QHS  . insulin aspart  0-9 Units Subcutaneous TID WC  . latanoprost  1 drop Both Eyes QHS  . lisinopril  20 mg Oral BID  . potassium chloride  20 mEq Oral BID  . sodium chloride  3 mL Intravenous Q12H  . Warfarin - Pharmacist Dosing Inpatient   Does not apply Q24H   Continuous:  VAP:OLIDCVUDTHYHO **OR** acetaminophen, HYDROcodone-homatropine, ondansetron **OR** ondansetron (ZOFRAN) IV  Assesment: She was admitted with CHF. Her echocardiogram shows good systolic function. She has chronic atrial fibrillation and has had some bradycardia. She overall looks much better. Principal Problem:   CHF (congestive heart failure) (HCC) Active Problems:   Mixed hyperlipidemia    Essential hypertension, benign   Atrial fibrillation ([place])   Long term current use of anticoagulant   Tricuspid valve regurgitation    Plan: I will request cardiology consultation. Continue with her IV Lasix for now. Start her back on compression stockings. She may be ready for discharge tomorrow    LOS: 2 days   Camran Keady L 02/19/2015, 9:12 AM

## 2015-02-19 NOTE — Progress Notes (Signed)
Continues to cough.  Requested Hycodan, given.

## 2015-02-19 NOTE — Progress Notes (Signed)
ANTICOAGULATION CONSULT NOTE   Pharmacy Consult for Coumadin Indication: atrial fibrillation  No Known Allergies  Patient Measurements: Height: 5\' 6"  (167.6 cm) Weight: 171 lb 9.6 oz (77.837 kg) IBW/kg (Calculated) : 59.3  Vital Signs: Temp: 98.9 F (37.2 C) (01/12 0654) Temp Source: Oral (01/12 0654) BP: 157/71 mmHg (01/12 0654) Pulse Rate: 51 (01/12 0654)  Labs:  Recent Labs  02/17/15 1759 02/17/15 1829 02/18/15 0550 02/19/15 0607  HGB 11.3*  --   --   --   HCT 34.5*  --   --   --   PLT 235  --   --   --   APTT  --  42*  --   --   LABPROT  --  26.6* 27.2* 29.7*  INR  --  2.49* 2.57* 2.88*  CREATININE  --  1.37* 1.34* 1.38*  TROPONINI  --  0.05*  --   --     Estimated Creatinine Clearance: 33.1 mL/min (by C-G formula based on Cr of 1.38).   Medical History: Past Medical History  Diagnosis Date  . Essential hypertension, benign   . Atrial fibrillation (HCC)   . Type 2 diabetes mellitus (HCC)   . Mixed hyperlipidemia     Medications:   Prescriptions prior to admission  Medication Sig Dispense Refill Last Dose  . amLODipine (NORVASC) 10 MG tablet TAKE (1) TABLET BY MOUTH DAILY. 30 tablet 6 02/17/2015 at Unknown time  . atorvastatin (LIPITOR) 20 MG tablet Take 20 mg by mouth daily.   02/17/2015 at Unknown time  . carvedilol (COREG) 25 MG tablet Take 25 mg by mouth 2 (two) times daily with a meal.   02/17/2015 at Unknown time  . cephALEXin (KEFLEX) 500 MG capsule Take 500 mg by mouth 3 (three) times daily. 10 day course starting on 02/10/2015   02/17/2015 at Unknown time  . furosemide (LASIX) 40 MG tablet Take 40 mg by mouth daily.    02/17/2015 at Unknown time  . glimepiride (AMARYL) 2 MG tablet Take 2 mg by mouth daily.     02/17/2015 at Unknown time  . HYDROcodone-acetaminophen (NORCO/VICODIN) 5-325 MG tablet Take 1 tablet by mouth 4 (four) times daily.    02/17/2015 at Unknown time  . lisinopril (PRINIVIL,ZESTRIL) 40 MG tablet Take 40 mg by mouth 2 (two) times  daily.     02/17/2015 at Unknown time  . metFORMIN (GLUCOPHAGE) 500 MG tablet Take 500 mg by mouth 2 (two) times daily with meals.     02/17/2015 at Unknown time  . solifenacin (VESICARE) 5 MG tablet Take 5 mg by mouth daily.   02/17/2015 at Unknown time  . TRAVATAN Z 0.004 % SOLN ophthalmic solution Place 1 drop into both eyes at bedtime.    02/16/2015 at Unknown time  . warfarin (COUMADIN) 5 MG tablet Take 1 tablet daily or as directed (Patient taking differently: Take 5 mg by mouth daily. ) 45 tablet 3 02/16/2015 at 1800   Assessment: 82 yo female on chronic anticoagulation for afib. Pt admitted for new onset of CHF. Plan to continue with coumadin. INR therapeutic.  Goal of Therapy:  INR 2-3 Monitor platelets by anticoagulation protocol: Yes   Plan:  Coumadin 4mg  po today Monitor for S/S of bleeding PT/INR daily  Thanks for allowing pharmacy to be a part of this patient's care.  Talbert CageLora Etai Copado, PharmD Clinical Pharmacist 02/19/2015,9:21 AM

## 2015-02-20 ENCOUNTER — Inpatient Hospital Stay (HOSPITAL_COMMUNITY): Payer: Medicare HMO

## 2015-02-20 DIAGNOSIS — I272 Other secondary pulmonary hypertension: Secondary | ICD-10-CM

## 2015-02-20 DIAGNOSIS — I071 Rheumatic tricuspid insufficiency: Secondary | ICD-10-CM

## 2015-02-20 DIAGNOSIS — I482 Chronic atrial fibrillation: Secondary | ICD-10-CM

## 2015-02-20 DIAGNOSIS — I5031 Acute diastolic (congestive) heart failure: Secondary | ICD-10-CM

## 2015-02-20 DIAGNOSIS — Z7901 Long term (current) use of anticoagulants: Secondary | ICD-10-CM

## 2015-02-20 DIAGNOSIS — I10 Essential (primary) hypertension: Secondary | ICD-10-CM

## 2015-02-20 DIAGNOSIS — I872 Venous insufficiency (chronic) (peripheral): Secondary | ICD-10-CM

## 2015-02-20 DIAGNOSIS — E782 Mixed hyperlipidemia: Secondary | ICD-10-CM

## 2015-02-20 LAB — PROTIME-INR
INR: 2.88 — ABNORMAL HIGH (ref 0.00–1.49)
Prothrombin Time: 29.7 seconds — ABNORMAL HIGH (ref 11.6–15.2)

## 2015-02-20 LAB — BASIC METABOLIC PANEL
Anion gap: 8 (ref 5–15)
BUN: 23 mg/dL — AB (ref 6–20)
CHLORIDE: 105 mmol/L (ref 101–111)
CO2: 29 mmol/L (ref 22–32)
CREATININE: 1.46 mg/dL — AB (ref 0.44–1.00)
Calcium: 8.2 mg/dL — ABNORMAL LOW (ref 8.9–10.3)
GFR calc Af Amer: 37 mL/min — ABNORMAL LOW (ref >60)
GFR calc non Af Amer: 32 mL/min — ABNORMAL LOW (ref >60)
Glucose, Bld: 125 mg/dL — ABNORMAL HIGH (ref 65–99)
Potassium: 3.5 mmol/L (ref 3.5–5.1)
Sodium: 142 mmol/L (ref 135–145)

## 2015-02-20 LAB — GLUCOSE, CAPILLARY
GLUCOSE-CAPILLARY: 185 mg/dL — AB (ref 65–99)
GLUCOSE-CAPILLARY: 111 mg/dL — AB (ref 65–99)
GLUCOSE-CAPILLARY: 148 mg/dL — AB (ref 65–99)
Glucose-Capillary: 119 mg/dL — ABNORMAL HIGH (ref 65–99)

## 2015-02-20 MED ORDER — POTASSIUM CHLORIDE CRYS ER 20 MEQ PO TBCR
40.0000 meq | EXTENDED_RELEASE_TABLET | Freq: Two times a day (BID) | ORAL | Status: DC
  Administered 2015-02-20 – 2015-02-25 (×11): 40 meq via ORAL
  Filled 2015-02-20 (×14): qty 2

## 2015-02-20 MED ORDER — HYDRALAZINE HCL 10 MG PO TABS
10.0000 mg | ORAL_TABLET | Freq: Three times a day (TID) | ORAL | Status: DC
  Administered 2015-02-20 – 2015-02-23 (×9): 10 mg via ORAL
  Filled 2015-02-20 (×11): qty 1

## 2015-02-20 MED ORDER — LEVALBUTEROL HCL 0.63 MG/3ML IN NEBU
0.6300 mg | INHALATION_SOLUTION | Freq: Four times a day (QID) | RESPIRATORY_TRACT | Status: DC | PRN
Start: 2015-02-20 — End: 2015-02-24

## 2015-02-20 MED ORDER — LEVALBUTEROL HCL 0.63 MG/3ML IN NEBU
0.6300 mg | INHALATION_SOLUTION | Freq: Three times a day (TID) | RESPIRATORY_TRACT | Status: DC
  Administered 2015-02-21 – 2015-02-24 (×10): 0.63 mg via RESPIRATORY_TRACT
  Filled 2015-02-20 (×10): qty 3

## 2015-02-20 MED ORDER — WARFARIN SODIUM 2 MG PO TABS
4.0000 mg | ORAL_TABLET | Freq: Once | ORAL | Status: DC
  Filled 2015-02-20: qty 2

## 2015-02-20 MED ORDER — DILTIAZEM HCL 30 MG PO TABS
30.0000 mg | ORAL_TABLET | Freq: Three times a day (TID) | ORAL | Status: DC
  Administered 2015-02-20 – 2015-02-24 (×9): 30 mg via ORAL
  Filled 2015-02-20 (×12): qty 1

## 2015-02-20 MED ORDER — HYDROCODONE-HOMATROPINE 5-1.5 MG/5ML PO SYRP
5.0000 mL | ORAL_SOLUTION | ORAL | Status: AC | PRN
  Administered 2015-02-20 – 2015-02-22 (×5): 5 mL via ORAL
  Filled 2015-02-20 (×5): qty 5

## 2015-02-20 MED ORDER — LEVALBUTEROL HCL 0.63 MG/3ML IN NEBU
0.6300 mg | INHALATION_SOLUTION | Freq: Three times a day (TID) | RESPIRATORY_TRACT | Status: DC
  Administered 2015-02-20 (×2): 0.63 mg via RESPIRATORY_TRACT
  Filled 2015-02-20 (×2): qty 3

## 2015-02-20 NOTE — Consult Note (Signed)
CARDIOLOGY CONSULT NOTE   Patient ID: Melissa [name] MRN: [id number] DOB/AGE: 1930/06/11 82 y.o.  Admit Date: 02/17/2015 Referring Physician: Kari Baars MD Primary Physician: Fredirick Maudlin, MD Consulting Cardiologist: Prentice Docker MD Primary Cardiologist Nona Dell MD Reason for Consultation: CHF, atrial fib  Clinical Summary Melissa [name] is a 82 y.o.female with known history of atrial fibrillation, CHADS VASC Score of 5, on coumadin,, with chronic dependent LEE, hx of diabetes, and hypertension. She was admitted with new onset CHF  On arrival to ER, she was found to be hypertensive, 183/104, HR 64, O2 Sat 96%. Creatinine 1.37, INR 2.49, Hgb 11.2, Hct 34.5. EKG demonstrated atrial fib with rate of 66 bpm. CXR demonstrated Central pulmonary vascular congestion with mild bilateral pulmonary edema is noted. She was treated with lasix 40 mg X 1 and continued on IV lasix 40 mg BID. She has diuresed 1.374 L so far. Wt has decreased 6 lbs from highest recorded wt since admission. We are asked for cardiology recommendations.   She states she had a cold for a few days after receiving flu shot and pneumovax inoculation. Began worsening coughing leading to more dyspnea. She admits to eating a lot of salty foods. She takes her medications herself with no help from family. Family at bedside states she is very good about that.  .  No Known Allergies  Medications Scheduled Medications: . amLODipine  10 mg Oral Daily  . antiseptic oral rinse  7 mL Mouth Rinse BID  . atorvastatin  20 mg Oral q1800  . carvedilol  25 mg Oral BID WC  . darifenacin  7.5 mg Oral Daily  . furosemide  40 mg Intravenous BID  . glimepiride  2 mg Oral Daily  . insulin aspart  0-5 Units Subcutaneous QHS  . insulin aspart  0-9 Units Subcutaneous TID WC  . latanoprost  1 drop Both Eyes QHS  . lisinopril  20 mg Oral BID  . potassium chloride  20 mEq Oral BID  . sodium chloride  3 mL Intravenous Q12H   . Warfarin - Pharmacist Dosing Inpatient   Does not apply Q24H    Infusions:    PRN Medications: acetaminophen **OR** acetaminophen, HYDROcodone-homatropine, ondansetron **OR** ondansetron (ZOFRAN) IV   Past Medical History  Diagnosis Date  . Essential hypertension, benign   . Atrial fibrillation (HCC)   . Type 2 diabetes mellitus (HCC)   . Mixed hyperlipidemia     Past Surgical History  Procedure Laterality Date  . Total hip arthroplasty  01/14/03    Left    Family History  Problem Relation Age of Onset  . Diabetes type II Mother   . Diabetes type II Father   . Hypertension Father     Social History Melissa [name] reports that she has never smoked. She has never used smokeless tobacco. Melissa [name] reports that she does not drink alcohol.  Review of Systems Complete review of systems are found to be negative unless outlined in H&P above.  Physical Examination Blood pressure 151/54, pulse 56, temperature 98.6 F (37 C), temperature source Oral, resp. rate 18, height 5\' 6"  (1.676 m), weight 171 lb 9.6 oz (77.837 kg), SpO2 98 %.  Intake/Output Summary (Last 24 hours) at 02/20/15 0817 Last data filed at 02/20/15 0609  Gross per 24 hour  Intake    723 ml  Output    350 ml  Net    373 ml    Telemetry: Atrial fib with slow ventricular response. PVC;s  GEN: No acute distress with difficulty hearing.  HEENT: Conjunctiva and lids normal, oropharynx clear with moist mucosa. Neck: Supple, Positive elevated JVP in 90 degree position, no carotid bruits, no thyromegaly. Lungs: Bilateral inspiratory wheezes, with crackles in the bases, diminished on the right.  Cardiac: Iregular rate and rhythm, no S3 or significant systolic murmur, no pericardial rub. Abdomen: Soft, nontender, no hepatomegaly, bowel sounds present, no guarding or rebound. Extremities: Bilateral pitting edema, R>L distal pulses 2+. Skin: Warm and dry. Musculoskeletal: No kyphosis. Neuropsychiatric: Alert  and oriented x3, affect grossly appropriate.VERY hard of hearing.   Prior Cardiac Testing/Procedures 1. Echocardiogram 02/18/2015 Left ventricle: The cavity size was normal. There was moderate concentric hypertrophy. Systolic function was normal. The estimated ejection fraction was in the range of 60% to 65%. The study was not technically sufficient to allow evaluation of LV diastolic dysfunction due to atrial fibrillation. - Ventricular septum: The contour showed diastolic flattening and systolic flattening. These changes are consistent with RV volume and pressure overload. - Aortic valve: Moderately calcified annulus. Trileaflet; mildly calcified leaflets. There was no stenosis. - Mitral valve: There was mild to moderate regurgitation. - Left atrium: The atrium was severely dilated. - Right atrium: The atrium was severely dilated. Central venous pressure (est): 8 mm Hg. - Atrial septum: The septum bowed from left to right, consistent with increased left atrial pressure. - Tricuspid valve: There was moderate-severe regurgitation. - Pulmonic valve: There was mild to moderate regurgitation. - Pulmonary arteries: Systolic pressure was severely increased. PA peak pressure: 75 mm Hg (S). - Systemic veins: IVC dilated with normal respiratory variation. - Pericardium, extracardiac: A trivial pericardial effusion was identified.  Lab Results  Basic Metabolic Panel:  Recent Labs Lab 02/17/15 1829 02/18/15 0550 02/19/15 0607 02/20/15 0556  NA 145 145 143 142  K 3.8 3.6 3.3* 3.5  CL 110 110 106 105  CO2 23 29 30 29   GLUCOSE 126* 125* 119* 125*  BUN 21* 19 19 23*  CREATININE 1.37* 1.34* 1.38* 1.46*  CALCIUM 9.2 8.7* 8.3* 8.2*   CBC:  Recent Labs Lab 02/17/15 1759  WBC 8.6  NEUTROABS 5.4  HGB 11.3*  HCT 34.5*  MCV 97.5  PLT 235   Cardiac Enzymes:  Recent Labs Lab 02/17/15 1829  TROPONINI 0.05*    Radiology: CXR 02/17/2015 Stable  cardiomegaly is noted. Central pulmonary vascular congestion with mild bilateral pulmonary edema is noted.  ECG: Atrial fib with rate of 66 bpm.   Impression and Recommendations  1. Acute pulmonary edema: She had been diuresed but still has significant bilateral edema of the LEE. Not sure of her baseline. Venous insufficiency vs lymphedema. Wt in the office office visit wt of 142 lbs on office visit on 11/12/2014. Current wt 171 lbs. She has significant pulmonary hypertension which is reflective in her elevated JVP.  Would increase dose of lasix to TID for now and follow creatinine. She is continually wheezing but no coughing. Consider going up on diuretic. CO2 29.   2. Questionable COPD: With pulmonary hypertension, but no evidence of COPD on CXR. Would repeat this now and begin inhalers, to assist with bronchodilatation. Severe Tricuspid Regurgitation is noted.   3. Hypertension: BP is better controlled currently with diureses but no optimal. Will add hydralazine 10 mg TID as creatinine is rising. Replete potassium Start hydralazine.   4. Atrial fibrillation: Heart rate is low normal. No evidence of pauses or rapid HR on review of telemetry. INR is elevated at 2.88. on Coreg. Would  consider changing to metoprolol  instead of non-cardio-selective BB, to avoid the wheezing.  or adding diltiazem and discontinuing amlodipine. For now, I will stop BB due to significant wheezing, stop amlodipine and begin low dose diltiazem 30 mg Q 8 hours. Coumadin per pharmacy.   Signed: Bettey Mare. Lawrence NP [facility]   The patient was seen and examined, and I agree with the assessment and plan as documented above, with modifications as noted below. Pt with chronic atrial fibrillation admitted with acute on chronic diastolic heart failure. HR currently in 46-50 bpm range. Says she feels better with respect to breathing and leg swelling, but both patient and daughter admit her legs are more swollen still than usual.  If weights are accurate, significantly elevated since last office visit with Dr. Diona Browner as noted above.  BUN 23 (19 on 1/12), creatinine 1.46 (previously 1.38). On Lasix 40 mg IV bid.  Repeat CXR pending. Still wheezing. Echo results noted above with normal LVEF and severe pulmonary hypertension.  Recommendations: 1. Continue IV Lasix 40 mg bid as she is still volume overloaded. Given severe pulmonary hypertension, if oral Lasix is not adequately diuresing patient in outpatient setting, consider torsemide. Carefully monitor renal function given slowly rising BUN/creat.  2. Given significant continued wheezing, agree with switching beta blockers to calcium channel blockers which should assist with both rate control and BP control. Hydralazine also added as noted above.  Prentice Docker, MD, [facility]  02/20/2015 11:00 AM   02/20/2015, 8:17 AM Co-Sign MD

## 2015-02-20 NOTE — Progress Notes (Signed)
ANTICOAGULATION CONSULT NOTE   Pharmacy Consult for Coumadin Indication: atrial fibrillation  No Known Allergies  Patient Measurements: Height: 5\' 6"  (167.6 cm) Weight: 171 lb 9.6 oz (77.837 kg) IBW/kg (Calculated) : 59.3  Vital Signs: Temp: 98.6 F (37 C) (01/13 0608) Temp Source: Oral (01/13 0608) BP: 151/54 mmHg (01/13 0608) Pulse Rate: 56 (01/13 0608)  Labs:  Recent Labs  02/17/15 1759  02/17/15 1829 02/18/15 0550 02/19/15 0607 02/20/15 0556  HGB 11.3*  --   --   --   --   --   HCT 34.5*  --   --   --   --   --   PLT 235  --   --   --   --   --   APTT  --   --  42*  --   --   --   LABPROT  --   < > 26.6* 27.2* 29.7* 29.7*  INR  --   < > 2.49* 2.57* 2.88* 2.88*  CREATININE  --   < > 1.37* 1.34* 1.38* 1.46*  TROPONINI  --   --  0.05*  --   --   --   < > = values in this interval not displayed.  Estimated Creatinine Clearance: 31.3 mL/min (by C-G formula based on Cr of 1.46).  Medical History: Past Medical History  Diagnosis Date  . Essential hypertension, benign   . Atrial fibrillation (HCC)   . Type 2 diabetes mellitus (HCC)   . Mixed hyperlipidemia   Medications:   Prescriptions prior to admission  Medication Sig Dispense Refill Last Dose  . amLODipine (NORVASC) 10 MG tablet TAKE (1) TABLET BY MOUTH DAILY. 30 tablet 6 02/17/2015 at Unknown time  . atorvastatin (LIPITOR) 20 MG tablet Take 20 mg by mouth daily.   02/17/2015 at Unknown time  . carvedilol (COREG) 25 MG tablet Take 25 mg by mouth 2 (two) times daily with a meal.   02/17/2015 at Unknown time  . cephALEXin (KEFLEX) 500 MG capsule Take 500 mg by mouth 3 (three) times daily. 10 day course starting on 02/10/2015   02/17/2015 at Unknown time  . furosemide (LASIX) 40 MG tablet Take 40 mg by mouth daily.    02/17/2015 at Unknown time  . glimepiride (AMARYL) 2 MG tablet Take 2 mg by mouth daily.     02/17/2015 at Unknown time  . HYDROcodone-acetaminophen (NORCO/VICODIN) 5-325 MG tablet Take 1 tablet by mouth 4  (four) times daily.    02/17/2015 at Unknown time  . lisinopril (PRINIVIL,ZESTRIL) 40 MG tablet Take 40 mg by mouth 2 (two) times daily.     02/17/2015 at Unknown time  . metFORMIN (GLUCOPHAGE) 500 MG tablet Take 500 mg by mouth 2 (two) times daily with meals.     02/17/2015 at Unknown time  . solifenacin (VESICARE) 5 MG tablet Take 5 mg by mouth daily.   02/17/2015 at Unknown time  . TRAVATAN Z 0.004 % SOLN ophthalmic solution Place 1 drop into both eyes at bedtime.    02/16/2015 at Unknown time  . warfarin (COUMADIN) 5 MG tablet Take 1 tablet daily or as directed (Patient taking differently: Take 5 mg by mouth daily. ) 45 tablet 3 02/16/2015 at 1800   Assessment: 82 yo female on chronic anticoagulation for afib. Pt admitted for new onset of CHF. Plan to continue with coumadin. INR therapeutic.  Goal of Therapy:  INR 2-3 Monitor platelets by anticoagulation protocol: Yes   Plan:  Coumadin 4mg   po today Monitor for S/S of bleeding PT/INR daily  Thanks for allowing pharmacy to be a part of this patient's care.  Valrie Hart, PharmD Clinical Pharmacist 02/20/2015,10:05 AM

## 2015-02-20 NOTE — Care Management Important Message (Signed)
Important Message  Patient Details  Name: Melissa HammedBettie L Corona MRN: [id number] Date of Birth: 10/29/1930   Medicare Important Message Given:  Yes    Adonis HugueninBerkhead, Latarsha Zani L, RN 02/20/2015, 9:59 AM

## 2015-02-20 NOTE — Progress Notes (Signed)
Subjective: Patient is resting. She is gradually improving  Objective: Vital signs in last 24 hours: Temp:  [98.5 F (36.9 C)-99.4 F (37.4 C)] 98.6 F (37 C) (01/13 1610) Pulse Rate:  [48-100] 56 (01/13 0608) Resp:  [18] 18 (01/13 0608) BP: (119-151)/(53-70) 151/54 mmHg (01/13 0608) SpO2:  [92 %-100 %] 98 % (01/13 9604) Weight change:  Last BM Date: 02/16/15  Intake/Output from previous day: 01/12 0701 - 01/13 0700 In: 723 [P.O.:720; I.V.:3] Out: 350 [Urine:350]  PHYSICAL EXAM General appearance: alert and no distress Resp: clear to auscultation bilaterally Cardio: S1, S2 normal GI: soft, non-tender; bowel sounds normal; no masses,  no organomegaly Extremities: edema 2++ leg edema  Lab Results:  Results for orders placed or performed during the hospital encounter of 02/17/15 (from the past 48 hour(s))  Glucose, capillary     Status: Abnormal   Collection Time: 02/18/15 11:46 AM  Result Value Ref Range   Glucose-Capillary 147 (H) 65 - 99 mg/dL  Glucose, capillary     Status: Abnormal   Collection Time: 02/18/15  4:32 PM  Result Value Ref Range   Glucose-Capillary 139 (H) 65 - 99 mg/dL  Glucose, capillary     Status: Abnormal   Collection Time: 02/18/15  8:50 PM  Result Value Ref Range   Glucose-Capillary 152 (H) 65 - 99 mg/dL   Comment 1 Notify RN    Comment 2 Document in Chart   Basic metabolic panel     Status: Abnormal   Collection Time: 02/19/15  6:07 AM  Result Value Ref Range   Sodium 143 135 - 145 mmol/L   Potassium 3.3 (L) 3.5 - 5.1 mmol/L   Chloride 106 101 - 111 mmol/L   CO2 30 22 - 32 mmol/L   Glucose, Bld 119 (H) 65 - 99 mg/dL   BUN 19 6 - 20 mg/dL   Creatinine, Ser 1.38 (H) 0.44 - 1.00 mg/dL   Calcium 8.3 (L) 8.9 - 10.3 mg/dL   GFR calc non Af Amer 35 (L) >60 mL/min   GFR calc Af Amer 40 (L) >60 mL/min    Comment: (NOTE) The eGFR has been calculated using the CKD EPI equation. This calculation has not been validated in all clinical  situations. eGFR's persistently <60 mL/min signify possible Chronic Kidney Disease.    Anion gap 7 5 - 15  Protime-INR     Status: Abnormal   Collection Time: 02/19/15  6:07 AM  Result Value Ref Range   Prothrombin Time 29.7 (H) 11.6 - 15.2 seconds   INR 2.88 (H) 0.00 - 1.49  Glucose, capillary     Status: Abnormal   Collection Time: 02/19/15  7:35 AM  Result Value Ref Range   Glucose-Capillary 111 (H) 65 - 99 mg/dL  Glucose, capillary     Status: Abnormal   Collection Time: 02/19/15 11:39 AM  Result Value Ref Range   Glucose-Capillary 168 (H) 65 - 99 mg/dL  Glucose, capillary     Status: Abnormal   Collection Time: 02/19/15  4:26 PM  Result Value Ref Range   Glucose-Capillary 106 (H) 65 - 99 mg/dL   Comment 1 Notify RN    Comment 2 Document in Chart   Glucose, capillary     Status: Abnormal   Collection Time: 02/19/15  8:59 PM  Result Value Ref Range   Glucose-Capillary 128 (H) 65 - 99 mg/dL  Basic metabolic panel     Status: Abnormal   Collection Time: 02/20/15  5:56 AM  Result  Value Ref Range   Sodium 142 135 - 145 mmol/L   Potassium 3.5 3.5 - 5.1 mmol/L   Chloride 105 101 - 111 mmol/L   CO2 29 22 - 32 mmol/L   Glucose, Bld 125 (H) 65 - 99 mg/dL   BUN 23 (H) 6 - 20 mg/dL   Creatinine, Ser 1.46 (H) 0.44 - 1.00 mg/dL   Calcium 8.2 (L) 8.9 - 10.3 mg/dL   GFR calc non Af Amer 32 (L) >60 mL/min   GFR calc Af Amer 37 (L) >60 mL/min    Comment: (NOTE) The eGFR has been calculated using the CKD EPI equation. This calculation has not been validated in all clinical situations. eGFR's persistently <60 mL/min signify possible Chronic Kidney Disease.    Anion gap 8 5 - 15  Protime-INR     Status: Abnormal   Collection Time: 02/20/15  5:56 AM  Result Value Ref Range   Prothrombin Time 29.7 (H) 11.6 - 15.2 seconds   INR 2.88 (H) 0.00 - 1.49  Glucose, capillary     Status: Abnormal   Collection Time: 02/20/15  8:14 AM  Result Value Ref Range   Glucose-Capillary 119 (H) 65 -  99 mg/dL   Comment 1 Notify RN     ABGS No results for input(s): PHART, PO2ART, TCO2, HCO3 in the last 72 hours.  Invalid input(s): PCO2 CULTURES No results found for this or any previous visit (from the past 240 hour(s)). Studies/Results: No results found.  Medications: I have reviewed the patient's current medications.  Assesment:   Principal Problem:   CHF (congestive heart failure) (HCC) Active Problems:   Mixed hyperlipidemia   Essential hypertension, benign   Atrial fibrillation ([place])   Long term current use of anticoagulant   Tricuspid valve regurgitation   Chronic venous insufficiency    Plan: Medications reviewed Continue diuretics' Will monitor [facility] Cardiology consult pending    LOS: 3 days   Deaaron Fulghum 02/20/2015, 8:24 AM

## 2015-02-21 LAB — BASIC METABOLIC PANEL
ANION GAP: 7 (ref 5–15)
BUN: 27 mg/dL — ABNORMAL HIGH (ref 6–20)
CHLORIDE: 102 mmol/L (ref 101–111)
CO2: 28 mmol/L (ref 22–32)
CREATININE: 1.62 mg/dL — AB (ref 0.44–1.00)
Calcium: 7.9 mg/dL — ABNORMAL LOW (ref 8.9–10.3)
GFR calc non Af Amer: 28 mL/min — ABNORMAL LOW (ref >60)
GFR, EST AFRICAN AMERICAN: 33 mL/min — AB (ref >60)
Glucose, Bld: 125 mg/dL — ABNORMAL HIGH (ref 65–99)
Potassium: 4.4 mmol/L (ref 3.5–5.1)
Sodium: 137 mmol/L (ref 135–145)

## 2015-02-21 LAB — PROTIME-INR
INR: 2.27 — AB (ref 0.00–1.49)
PROTHROMBIN TIME: 24.8 s — AB (ref 11.6–15.2)

## 2015-02-21 LAB — GLUCOSE, CAPILLARY
GLUCOSE-CAPILLARY: 143 mg/dL — AB (ref 65–99)
GLUCOSE-CAPILLARY: 152 mg/dL — AB (ref 65–99)
Glucose-Capillary: 92 mg/dL (ref 65–99)
Glucose-Capillary: 136 mg/dL — ABNORMAL HIGH (ref 65–99)

## 2015-02-21 MED ORDER — WARFARIN SODIUM 2 MG PO TABS
4.0000 mg | ORAL_TABLET | Freq: Once | ORAL | Status: AC
  Administered 2015-02-21: 4 mg via ORAL
  Filled 2015-02-21: qty 2

## 2015-02-21 NOTE — Progress Notes (Signed)
1007 IV access lost & patient/family requesting to have Lasix changed to PO d/t patient possibly going home tomorrow and wanting to avoid being stuck again. MD notified.

## 2015-02-21 NOTE — Progress Notes (Signed)
Subjective: Patient feels better. She on IV diuretics.No new complaint  Objective: Vital signs in last 24 hours: Temp:  [98.9 F (37.2 C)-99.7 F (37.6 C)] 98.9 F (37.2 C) (01/14 0606) Pulse Rate:  [43-60] 60 (01/14 0606) Resp:  [18] 18 (01/14 0606) BP: (130-158)/(54-61) 152/61 mmHg (01/14 0606) SpO2:  [91 %-99 %] 96 % (01/14 0811) Weight:  [77.6 kg (171 lb 1.2 oz)] 77.6 kg (171 lb 1.2 oz) (01/14 0606) Weight change:  Last BM Date: 02/16/15  Intake/Output from previous day: 01/13 0701 - 01/14 0700 In: 1200 [P.O.:1200] Out: 700 [Urine:700]  PHYSICAL EXAM General appearance: alert and no distress Resp: diminished breath sounds bilaterally and rhonchi bilaterally Cardio: S1, S2 normal GI: soft, non-tender; bowel sounds normal; no masses,  no organomegaly Extremities: edema 2++ leg edema  Lab Results:  Results for orders placed or performed during the hospital encounter of 02/17/15 (from the past 48 hour(s))  Glucose, capillary     Status: Abnormal   Collection Time: 02/19/15 11:39 AM  Result Value Ref Range   Glucose-Capillary 168 (H) 65 - 99 mg/dL  Glucose, capillary     Status: Abnormal   Collection Time: 02/19/15  4:26 PM  Result Value Ref Range   Glucose-Capillary 106 (H) 65 - 99 mg/dL   Comment 1 Notify RN    Comment 2 Document in Chart   Glucose, capillary     Status: Abnormal   Collection Time: 02/19/15  8:59 PM  Result Value Ref Range   Glucose-Capillary 128 (H) 65 - 99 mg/dL  Basic metabolic panel     Status: Abnormal   Collection Time: 02/20/15  5:56 AM  Result Value Ref Range   Sodium 142 135 - 145 mmol/L   Potassium 3.5 3.5 - 5.1 mmol/L   Chloride 105 101 - 111 mmol/L   CO2 29 22 - 32 mmol/L   Glucose, Bld 125 (H) 65 - 99 mg/dL   BUN 23 (H) 6 - 20 mg/dL   Creatinine, Ser 1.46 (H) 0.44 - 1.00 mg/dL   Calcium 8.2 (L) 8.9 - 10.3 mg/dL   GFR calc non Af Amer 32 (L) >60 mL/min   GFR calc Af Amer 37 (L) >60 mL/min    Comment: (NOTE) The eGFR has been  calculated using the CKD EPI equation. This calculation has not been validated in all clinical situations. eGFR's persistently <60 mL/min signify possible Chronic Kidney Disease.    Anion gap 8 5 - 15  Protime-INR     Status: Abnormal   Collection Time: 02/20/15  5:56 AM  Result Value Ref Range   Prothrombin Time 29.7 (H) 11.6 - 15.2 seconds   INR 2.88 (H) 0.00 - 1.49  Glucose, capillary     Status: Abnormal   Collection Time: 02/20/15  8:14 AM  Result Value Ref Range   Glucose-Capillary 119 (H) 65 - 99 mg/dL   Comment 1 Notify RN   Glucose, capillary     Status: Abnormal   Collection Time: 02/20/15 11:15 AM  Result Value Ref Range   Glucose-Capillary 185 (H) 65 - 99 mg/dL   Comment 1 Notify RN   Glucose, capillary     Status: Abnormal   Collection Time: 02/20/15  4:35 PM  Result Value Ref Range   Glucose-Capillary 111 (H) 65 - 99 mg/dL   Comment 1 Notify RN   Glucose, capillary     Status: Abnormal   Collection Time: 02/20/15  9:25 PM  Result Value Ref Range   Glucose-Capillary  148 (H) 65 - 99 mg/dL  Basic metabolic panel     Status: Abnormal   Collection Time: 02/21/15  1:29 AM  Result Value Ref Range   Sodium 137 135 - 145 mmol/L   Potassium 4.4 3.5 - 5.1 mmol/L    Comment: DELTA CHECK NOTED   Chloride 102 101 - 111 mmol/L   CO2 28 22 - 32 mmol/L   Glucose, Bld 125 (H) 65 - 99 mg/dL   BUN 27 (H) 6 - 20 mg/dL   Creatinine, Ser 1.62 (H) 0.44 - 1.00 mg/dL   Calcium 7.9 (L) 8.9 - 10.3 mg/dL   GFR calc non Af Amer 28 (L) >60 mL/min   GFR calc Af Amer 33 (L) >60 mL/min    Comment: (NOTE) The eGFR has been calculated using the CKD EPI equation. This calculation has not been validated in all clinical situations. eGFR's persistently <60 mL/min signify possible Chronic Kidney Disease.    Anion gap 7 5 - 15  Glucose, capillary     Status: None   Collection Time: 02/21/15  8:15 AM  Result Value Ref Range   Glucose-Capillary 92 65 - 99 mg/dL   Comment 1 Notify RN      ABGS No results for input(s): PHART, PO2ART, TCO2, HCO3 in the last 72 hours.  Invalid input(s): PCO2 CULTURES No results found for this or any previous visit (from the past 240 hour(s)). Studies/Results: Dg Chest 2 View  02/20/2015  CLINICAL DATA:  Cough and shortness of breath EXAM: CHEST  2 VIEW COMPARISON:  02/17/2015 FINDINGS: There is chronic cardiopericardial enlargement with aortic tortuosity. Increased density at the left base compatible with pneumonia or atelectasis. No edema on today's scan. No pleural effusion or pneumothorax. IMPRESSION: 1. New left lower lobe pneumonia or atelectasis. 2. Cardiomegaly without failure. Electronically Signed   By: Monte Fantasia M.D.   On: 02/20/2015 15:44    Medications: I have reviewed the patient's current medications.  Assesment:   Principal Problem:   CHF (congestive heart failure) (HCC) Active Problems:   Mixed hyperlipidemia   Essential hypertension, benign   Atrial fibrillation ([place])   Long term current use of anticoagulant   Tricuspid valve regurgitation   Chronic venous insufficiency    Plan: Medications reviewed Continue diuretics' Will monitor [facility] Cardiology consult pappreciated    LOS: 4 days   Rorey Hodges 02/21/2015, 9:04 AM

## 2015-02-21 NOTE — Progress Notes (Signed)
ANTICOAGULATION CONSULT NOTE   Pharmacy Consult for Coumadin Indication: atrial fibrillation  No Known Allergies  Patient Measurements: Height: 5\' 6"  (167.6 cm) Weight: 171 lb 1.2 oz (77.6 kg) IBW/kg (Calculated) : 59.3  Vital Signs: Temp: 98.9 F (37.2 C) (01/14 0606) Temp Source: Oral (01/14 0606) BP: 152/61 mmHg (01/14 0606) Pulse Rate: 48 (01/14 0811)  Labs:  Recent Labs  02/19/15 0607 02/20/15 0556 02/21/15 0129 02/21/15 0903  LABPROT 29.7* 29.7*  --  24.8*  INR 2.88* 2.88*  --  2.27*  CREATININE 1.38* 1.46* 1.62*  --     Estimated Creatinine Clearance: 28.1 mL/min (by C-G formula based on Cr of 1.62).  Medical History: Past Medical History  Diagnosis Date  . Essential hypertension, benign   . Atrial fibrillation (HCC)   . Type 2 diabetes mellitus (HCC)   . Mixed hyperlipidemia   Medications:   Prescriptions prior to admission  Medication Sig Dispense Refill Last Dose  . amLODipine (NORVASC) 10 MG tablet TAKE (1) TABLET BY MOUTH DAILY. 30 tablet 6 02/17/2015 at Unknown time  . atorvastatin (LIPITOR) 20 MG tablet Take 20 mg by mouth daily.   02/17/2015 at Unknown time  . carvedilol (COREG) 25 MG tablet Take 25 mg by mouth 2 (two) times daily with a meal.   02/17/2015 at Unknown time  . cephALEXin (KEFLEX) 500 MG capsule Take 500 mg by mouth 3 (three) times daily. 10 day course starting on 02/10/2015   02/17/2015 at Unknown time  . furosemide (LASIX) 40 MG tablet Take 40 mg by mouth daily.    02/17/2015 at Unknown time  . glimepiride (AMARYL) 2 MG tablet Take 2 mg by mouth daily.     02/17/2015 at Unknown time  . HYDROcodone-acetaminophen (NORCO/VICODIN) 5-325 MG tablet Take 1 tablet by mouth 4 (four) times daily.    02/17/2015 at Unknown time  . lisinopril (PRINIVIL,ZESTRIL) 40 MG tablet Take 40 mg by mouth 2 (two) times daily.     02/17/2015 at Unknown time  . metFORMIN (GLUCOPHAGE) 500 MG tablet Take 500 mg by mouth 2 (two) times daily with meals.     02/17/2015 at  Unknown time  . solifenacin (VESICARE) 5 MG tablet Take 5 mg by mouth daily.   02/17/2015 at Unknown time  . TRAVATAN Z 0.004 % SOLN ophthalmic solution Place 1 drop into both eyes at bedtime.    02/16/2015 at Unknown time  . warfarin (COUMADIN) 5 MG tablet Take 1 tablet daily or as directed (Patient taking differently: Take 5 mg by mouth daily. ) 45 tablet 3 02/16/2015 at 1800   Assessment: 82 yo female on chronic anticoagulation for afib. Pt admitted for new onset of CHF. Plan to continue with coumadin. INR therapeutic.  Goal of Therapy:  INR 2-3 Monitor platelets by anticoagulation protocol: Yes   Plan:  Coumadin 4mg  po today Monitor for S/S of bleeding PT/INR daily  Thanks for allowing pharmacy to be a part of this patient's care.  Valrie HartScott Carleigh Buccieri, PharmD Clinical Pharmacist 02/21/2015,12:01 PM

## 2015-02-22 ENCOUNTER — Inpatient Hospital Stay (HOSPITAL_COMMUNITY): Payer: Medicare HMO

## 2015-02-22 LAB — BASIC METABOLIC PANEL
Anion gap: 7 (ref 5–15)
BUN: 28 mg/dL — ABNORMAL HIGH (ref 6–20)
CO2: 29 mmol/L (ref 22–32)
Calcium: 8.4 mg/dL — ABNORMAL LOW (ref 8.9–10.3)
Chloride: 104 mmol/L (ref 101–111)
Creatinine, Ser: 1.44 mg/dL — ABNORMAL HIGH (ref 0.44–1.00)
GFR calc Af Amer: 38 mL/min — ABNORMAL LOW (ref >60)
GFR, EST NON AFRICAN AMERICAN: 33 mL/min — AB (ref >60)
Glucose, Bld: 112 mg/dL — ABNORMAL HIGH (ref 65–99)
Potassium: 4.1 mmol/L (ref 3.5–5.1)
Sodium: 140 mmol/L (ref 135–145)

## 2015-02-22 LAB — PROTIME-INR
INR: 1.99 — AB (ref 0.00–1.49)
PROTHROMBIN TIME: 22.5 s — AB (ref 11.6–15.2)

## 2015-02-22 LAB — GLUCOSE, CAPILLARY
GLUCOSE-CAPILLARY: 133 mg/dL — AB (ref 65–99)
GLUCOSE-CAPILLARY: 133 mg/dL — AB (ref 65–99)
Glucose-Capillary: 90 mg/dL (ref 65–99)
Glucose-Capillary: 146 mg/dL — ABNORMAL HIGH (ref 65–99)

## 2015-02-22 MED ORDER — WARFARIN SODIUM 5 MG PO TABS
5.0000 mg | ORAL_TABLET | Freq: Once | ORAL | Status: AC
  Administered 2015-02-22: 5 mg via ORAL
  Filled 2015-02-22: qty 1

## 2015-02-22 MED ORDER — POLYETHYLENE GLYCOL 3350 17 G PO PACK
17.0000 g | PACK | Freq: Every day | ORAL | Status: DC | PRN
  Administered 2015-02-22: 17 g via ORAL
  Filled 2015-02-22: qty 1

## 2015-02-22 MED ORDER — FUROSEMIDE 10 MG/ML IJ SOLN: 60.0000 mg | Freq: Two times a day (BID) | INTRAMUSCULAR | Status: DC

## 2015-02-22 NOTE — Progress Notes (Signed)
ANTICOAGULATION CONSULT NOTE   Pharmacy Consult for Coumadin Indication: atrial fibrillation  No Known Allergies  Patient Measurements: Height: 5\' 6"  (167.6 cm) Weight: 172 lb 12.8 oz (78.382 kg) IBW/kg (Calculated) : 59.3  Vital Signs: Temp: 98.7 F (37.1 C) (01/15 0514) Temp Source: Oral (01/15 0514) BP: 170/73 mmHg (01/15 0514) Pulse Rate: 50 (01/15 0732)  Labs:  Recent Labs  02/20/15 0556 02/21/15 0129 02/21/15 0903 02/22/15 0609  LABPROT 29.7*  --  24.8* 22.5*  INR 2.88*  --  2.27* 1.99*  CREATININE 1.46* 1.62*  --  1.44*    Estimated Creatinine Clearance: 31.8 mL/min (by C-G formula based on Cr of 1.44).  Medical History: Past Medical History  Diagnosis Date  . Essential hypertension, benign   . Atrial fibrillation (HCC)   . Type 2 diabetes mellitus (HCC)   . Mixed hyperlipidemia   Medications:   Prescriptions prior to admission  Medication Sig Dispense Refill Last Dose  . amLODipine (NORVASC) 10 MG tablet TAKE (1) TABLET BY MOUTH DAILY. 30 tablet 6 02/17/2015 at Unknown time  . atorvastatin (LIPITOR) 20 MG tablet Take 20 mg by mouth daily.   02/17/2015 at Unknown time  . carvedilol (COREG) 25 MG tablet Take 25 mg by mouth 2 (two) times daily with a meal.   02/17/2015 at Unknown time  . cephALEXin (KEFLEX) 500 MG capsule Take 500 mg by mouth 3 (three) times daily. 10 day course starting on 02/10/2015   02/17/2015 at Unknown time  . furosemide (LASIX) 40 MG tablet Take 40 mg by mouth daily.    02/17/2015 at Unknown time  . glimepiride (AMARYL) 2 MG tablet Take 2 mg by mouth daily.     02/17/2015 at Unknown time  . HYDROcodone-acetaminophen (NORCO/VICODIN) 5-325 MG tablet Take 1 tablet by mouth 4 (four) times daily.    02/17/2015 at Unknown time  . lisinopril (PRINIVIL,ZESTRIL) 40 MG tablet Take 40 mg by mouth 2 (two) times daily.     02/17/2015 at Unknown time  . metFORMIN (GLUCOPHAGE) 500 MG tablet Take 500 mg by mouth 2 (two) times daily with meals.     02/17/2015 at  Unknown time  . solifenacin (VESICARE) 5 MG tablet Take 5 mg by mouth daily.   02/17/2015 at Unknown time  . TRAVATAN Z 0.004 % SOLN ophthalmic solution Place 1 drop into both eyes at bedtime.    02/16/2015 at Unknown time  . warfarin (COUMADIN) 5 MG tablet Take 1 tablet daily or as directed (Patient taking differently: Take 5 mg by mouth daily. ) 45 tablet 3 02/16/2015 at 1800   Assessment: 82 yo female on chronic anticoagulation for afib. Pt admitted for new onset of CHF. Plan to continue with coumadin. INR slightly below therapeutic range.  Goal of Therapy:  INR 2-3 Monitor platelets by anticoagulation protocol: Yes   Plan:  Coumadin 5mg  po today Monitor for S/S of bleeding PT/INR daily  Thanks for allowing pharmacy to be a part of this patient's care.  Valrie HartScott Khylah Kendra, PharmD Clinical Pharmacist 02/22/2015,8:41 AM

## 2015-02-22 NOTE — Progress Notes (Signed)
Subjective:Patient is intermittently cough. She is not diuresing well. Her weight is increasing. She is on 40 mg lasix BID.  Objective: Vital signs in last 24 hours: Temp:  [98.5 F (36.9 C)-98.8 F (37.1 C)] 98.7 F (37.1 C) (01/15 0514) Pulse Rate:  [50-84] 50 (01/15 0732) Resp:  [16-20] 17 (01/15 0732) BP: (135-170)/(61-76) 170/73 mmHg (01/15 0514) SpO2:  [95 %-99 %] 97 % (01/15 0732) Weight:  [78.382 kg (172 lb 12.8 oz)] 78.382 kg (172 lb 12.8 oz) (01/15 0700) Weight change: 0.782 kg (1 lb 11.6 oz) Last BM Date: 02/16/15 (MD notified )  Intake/Output from previous day: 01/14 0701 - 01/15 0700 In: 720 [P.O.:720] Out: 375 [Urine:375]  PHYSICAL EXAM General appearance: alert and no distress Resp: diminished breath sounds bilaterally and rhonchi bilaterally Cardio: S1, S2 normal GI: soft, non-tender; bowel sounds normal; no masses,  no organomegaly Extremities: edema 2++ leg edema  Lab Results:  Results for orders placed or performed during the hospital encounter of 02/17/15 (from the past 48 hour(s))  Glucose, capillary     Status: Abnormal   Collection Time: 02/20/15 11:15 AM  Result Value Ref Range   Glucose-Capillary 185 (H) 65 - 99 mg/dL   Comment 1 Notify RN   Glucose, capillary     Status: Abnormal   Collection Time: 02/20/15  4:35 PM  Result Value Ref Range   Glucose-Capillary 111 (H) 65 - 99 mg/dL   Comment 1 Notify RN   Glucose, capillary     Status: Abnormal   Collection Time: 02/20/15  9:25 PM  Result Value Ref Range   Glucose-Capillary 148 (H) 65 - 99 mg/dL  Basic metabolic panel     Status: Abnormal   Collection Time: 02/21/15  1:29 AM  Result Value Ref Range   Sodium 137 135 - 145 mmol/L   Potassium 4.4 3.5 - 5.1 mmol/L    Comment: DELTA CHECK NOTED   Chloride 102 101 - 111 mmol/L   CO2 28 22 - 32 mmol/L   Glucose, Bld 125 (H) 65 - 99 mg/dL   BUN 27 (H) 6 - 20 mg/dL   Creatinine, Ser 1.62 (H) 0.44 - 1.00 mg/dL   Calcium 7.9 (L) 8.9 - 10.3 mg/dL    GFR calc non Af Amer 28 (L) >60 mL/min   GFR calc Af Amer 33 (L) >60 mL/min    Comment: (NOTE) The eGFR has been calculated using the CKD EPI equation. This calculation has not been validated in all clinical situations. eGFR's persistently <60 mL/min signify possible Chronic Kidney Disease.    Anion gap 7 5 - 15  Glucose, capillary     Status: None   Collection Time: 02/21/15  8:15 AM  Result Value Ref Range   Glucose-Capillary 92 65 - 99 mg/dL   Comment 1 Notify RN   Protime-INR     Status: Abnormal   Collection Time: 02/21/15  9:03 AM  Result Value Ref Range   Prothrombin Time 24.8 (H) 11.6 - 15.2 seconds   INR 2.27 (H) 0.00 - 1.49  Glucose, capillary     Status: Abnormal   Collection Time: 02/21/15 11:58 AM  Result Value Ref Range   Glucose-Capillary 143 (H) 65 - 99 mg/dL   Comment 1 Notify RN   Glucose, capillary     Status: Abnormal   Collection Time: 02/21/15  4:48 PM  Result Value Ref Range   Glucose-Capillary 152 (H) 65 - 99 mg/dL   Comment 1 Notify RN   Glucose,  capillary     Status: Abnormal   Collection Time: 02/21/15  9:23 PM  Result Value Ref Range   Glucose-Capillary 136 (H) 65 - 99 mg/dL   Comment 1 Notify RN    Comment 2 Document in Chart   Basic metabolic panel     Status: Abnormal   Collection Time: 02/22/15  6:09 AM  Result Value Ref Range   Sodium 140 135 - 145 mmol/L   Potassium 4.1 3.5 - 5.1 mmol/L   Chloride 104 101 - 111 mmol/L   CO2 29 22 - 32 mmol/L   Glucose, Bld 112 (H) 65 - 99 mg/dL   BUN 28 (H) 6 - 20 mg/dL   Creatinine, Ser 1.44 (H) 0.44 - 1.00 mg/dL   Calcium 8.4 (L) 8.9 - 10.3 mg/dL   GFR calc non Af Amer 33 (L) >60 mL/min   GFR calc Af Amer 38 (L) >60 mL/min    Comment: (NOTE) The eGFR has been calculated using the CKD EPI equation. This calculation has not been validated in all clinical situations. eGFR's persistently <60 mL/min signify possible Chronic Kidney Disease.    Anion gap 7 5 - 15  Protime-INR     Status: Abnormal    Collection Time: 02/22/15  6:09 AM  Result Value Ref Range   Prothrombin Time 22.5 (H) 11.6 - 15.2 seconds   INR 1.99 (H) 0.00 - 1.49  Glucose, capillary     Status: None   Collection Time: 02/22/15  7:45 AM  Result Value Ref Range   Glucose-Capillary 90 65 - 99 mg/dL   Comment 1 Notify RN     ABGS No results for input(s): PHART, PO2ART, TCO2, HCO3 in the last 72 hours.  Invalid input(s): PCO2 CULTURES No results found for this or any previous visit (from the past 240 hour(s)). Studies/Results: Dg Chest 2 View  02/20/2015  CLINICAL DATA:  Cough and shortness of breath EXAM: CHEST  2 VIEW COMPARISON:  02/17/2015 FINDINGS: There is chronic cardiopericardial enlargement with aortic tortuosity. Increased density at the left base compatible with pneumonia or atelectasis. No edema on today's scan. No pleural effusion or pneumothorax. IMPRESSION: 1. New left lower lobe pneumonia or atelectasis. 2. Cardiomegaly without failure. Electronically Signed   By: Monte Fantasia M.D.   On: 02/20/2015 15:44    Medications: I have reviewed the patient's current medications.  Assesment:   Principal Problem:   CHF (congestive heart failure) (HCC) Active Problems:   Mixed hyperlipidemia   Essential hypertension, benign   Atrial fibrillation ([place])   Long term current use of anticoagulant   Tricuspid valve regurgitation   Chronic venous insufficiency    Plan: Medications reviewed Will increase lasix to 60 mg IV BID Repeat chest X-Ray Will monitor BMP Cardiology consult pappreciated    LOS: 5 days   Britiney Blahnik 02/22/2015, 9:25 AM

## 2015-02-23 LAB — GLUCOSE, CAPILLARY
GLUCOSE-CAPILLARY: 102 mg/dL — AB (ref 65–99)
GLUCOSE-CAPILLARY: 141 mg/dL — AB (ref 65–99)
GLUCOSE-CAPILLARY: 157 mg/dL — AB (ref 65–99)
Glucose-Capillary: 157 mg/dL — ABNORMAL HIGH (ref 65–99)

## 2015-02-23 LAB — BASIC METABOLIC PANEL
ANION GAP: 7 (ref 5–15)
BUN: 27 mg/dL — ABNORMAL HIGH (ref 6–20)
CO2: 33 mmol/L — AB (ref 22–32)
Calcium: 9 mg/dL (ref 8.9–10.3)
Chloride: 102 mmol/L (ref 101–111)
Creatinine, Ser: 1.37 mg/dL — ABNORMAL HIGH (ref 0.44–1.00)
GFR calc Af Amer: 40 mL/min — ABNORMAL LOW (ref >60)
GFR, EST NON AFRICAN AMERICAN: 35 mL/min — AB (ref >60)
GLUCOSE: 98 mg/dL (ref 65–99)
POTASSIUM: 4.2 mmol/L (ref 3.5–5.1)
Sodium: 142 mmol/L (ref 135–145)

## 2015-02-23 LAB — PROTIME-INR
INR: 1.69 — AB (ref 0.00–1.49)
Prothrombin Time: 19.9 seconds — ABNORMAL HIGH (ref 11.6–15.2)

## 2015-02-23 MED ORDER — TORSEMIDE 20 MG PO TABS: 40.0000 mg | ORAL_TABLET | Freq: Two times a day (BID) | ORAL | Status: DC

## 2015-02-23 MED ORDER — FUROSEMIDE 10 MG/ML IJ SOLN
40.0000 mg | Freq: Two times a day (BID) | INTRAMUSCULAR | Status: DC
  Administered 2015-02-23 – 2015-02-25 (×5): 40 mg via INTRAVENOUS
  Filled 2015-02-23 (×5): qty 4

## 2015-02-23 MED ORDER — WARFARIN SODIUM 5 MG PO TABS
7.5000 mg | ORAL_TABLET | Freq: Once | ORAL | Status: AC
  Administered 2015-02-23: 7.5 mg via ORAL
  Filled 2015-02-23: qty 2

## 2015-02-23 MED ORDER — HYDRALAZINE HCL 25 MG PO TABS
25.0000 mg | ORAL_TABLET | Freq: Three times a day (TID) | ORAL | Status: DC
Start: 2015-02-23 — End: 2015-02-25
  Administered 2015-02-23 – 2015-02-25 (×6): 25 mg via ORAL
  Filled 2015-02-23 (×6): qty 1

## 2015-02-23 NOTE — Progress Notes (Signed)
Consulting cardiologist: Dina RichBranch, Maceo Hernan MD Primary Cardiologist: Nona DellMcDowell, Samuel MD  Cardiology Specific Problem List: 1. Atrial fibrillation 2. CHF 3. Hypertension  Subjective:    No complaints with the exception of arthritis pain. Continues coughing.   Objective:   Temp:  [98.4 F (36.9 C)-99 F (37.2 C)] 98.4 F (36.9 C) (01/16 0554) Pulse Rate:  [54-65] 65 (01/16 0554) Resp:  [20] 20 (01/16 0554) BP: (152-186)/(83-90) 186/90 mmHg (01/16 0554) SpO2:  [96 %-99 %] 97 % (01/16 0554) Weight:  [168 lb 12.8 oz (76.567 kg)] 168 lb 12.8 oz (76.567 kg) (01/16 0554) Last BM Date: 02/16/15 (MD notified )  Filed Weights   02/21/15 0606 02/22/15 0700 02/23/15 0554  Weight: 171 lb 1.2 oz (77.6 kg) 172 lb 12.8 oz (78.382 kg) 168 lb 12.8 oz (76.567 kg)    Intake/Output Summary (Last 24 hours) at 02/23/15 0743 Last data filed at 02/23/15 0555  Gross per 24 hour  Intake    720 ml  Output   2075 ml  Net  -1355 ml    Telemetry: Atrial fib rates in the 60';s   Exam:  General: No acute distress.  HEENT: Conjunctiva and lids normal, oropharynx clear.  Lungs: Inspiratory wheezes, with occasional coughing.   Cardiac: Elevated JVP no bruits. IRRR, no gallop or rub.   Abdomen: Normoactive bowel sounds, nontender, nondistended.  Extremities: No pitting edema, distal pulses full.  Neuropsychiatric: Alert and oriented x3, affect appropriate.Hard of hearing.    Lab Results:  Basic Metabolic Panel:  Recent Labs Lab 02/21/15 0129 02/22/15 0609 02/23/15 0610  NA 137 140 142  K 4.4 4.1 4.2  CL 102 104 102  CO2 28 29 33*  GLUCOSE 125* 112* 98  BUN 27* 28* 27*  CREATININE 1.62* 1.44* 1.37*  CALCIUM 7.9* 8.4* 9.0   CBC:  Recent Labs Lab 02/17/15 1759  WBC 8.6  HGB 11.3*  HCT 34.5*  MCV 97.5  PLT 235    Cardiac Enzymes:  Recent Labs Lab 02/17/15 1829  TROPONINI 0.05*    Coagulation:  Recent Labs Lab 02/21/15 0903 02/22/15 0609 02/23/15 0605   INR 2.27* 1.99* 1.69*    Radiology: Dg Chest 1 View  02/22/2015  CLINICAL DATA:  CHF, type II diabetes mellitus, essential benign hypertension, atrial fibrillation, mixed hyperlipidemia EXAM: CHEST 1 VIEW COMPARISON:  Portable exam 1134 hours compared to 02/20/2015 FINDINGS: Enlargement of cardiac silhouette with pulmonary vascular congestion. Atherosclerotic calcification aorta. Minimal pulmonary edema. Persistent atelectasis versus consolidation LEFT lower lobe. No gross pleural effusion or pneumothorax. Bones unremarkable. IMPRESSION: Enlargement of cardiac silhouette with pulmonary vascular congestion and minimal pulmonary edema/CHF. Persistent LEFT lower lobe atelectasis versus consolidation, little changed. Electronically Signed   By: Ulyses SouthwardMark  Boles M.D.   On: 02/22/2015 13:47      Medications:   Scheduled Medications: . antiseptic oral rinse  7 mL Mouth Rinse BID  . atorvastatin  20 mg Oral q1800  . darifenacin  7.5 mg Oral Daily  . diltiazem  30 mg Oral 3 times per day  . furosemide  60 mg Intravenous BID  . glimepiride  2 mg Oral Daily  . hydrALAZINE  10 mg Oral 3 times per day  . insulin aspart  0-5 Units Subcutaneous QHS  . insulin aspart  0-9 Units Subcutaneous TID WC  . latanoprost  1 drop Both Eyes QHS  . levalbuterol  0.63 mg Nebulization TID  . lisinopril  20 mg Oral BID  . potassium chloride  40 mEq Oral  BID  . sodium chloride  3 mL Intravenous Q12H  . warfarin  4 mg Oral Once  . Warfarin - Pharmacist Dosing Inpatient   Does not apply Q24H      PRN Medications: acetaminophen **OR** acetaminophen, levalbuterol, ondansetron **OR** ondansetron (ZOFRAN) IV, polyethylene glycol   Assessment and Plan:   1. Acute on Chronic Diastolic CHF: Echo on 02/18/2015 with severe pulmonary hypertension. She has lost IV access this am. She has diuresed 1.884 since admission with 2,075 overnight. CXR on 02/22/2015 demonstrated pulmonary edema and CHF. She continues inspiratory wheezes  but has no significant LEE. Will continue IV lasix.  Creatinine improved from admission from 1.62 to 1.37. CO2 33.   2. Atrial fib: Note from nurse states she had a 2 second pause on telemetry, but review of tele did not show this. She is rate controlled in the 60;s on po diltiazem 30 mg Q 6 hours. On coumadin per pharmacy with INR 1.69. CHADS VASC Score of 5.   3. Hypertension: Moderately controlled, systolic in the 150's to 180's. On hydralazine 10 mg TID. Will increase to 25 mg TID and monitor closely. Would not increase ACE with mild renal insufficiency and ongoing diureses.     Bettey Mare. Lawrence NP [facility]  02/23/2015, 7:43 AM   Attending Note Patient seen and discussed with NP Lyman Bishop, I agree with her documentation. 82 yo female history of HTN, afib on rate control and anticoag, DM2, hyperlipidemia admitted with acute pulmonary edema. Echo 02/2015 LVEF 60-65%, cannot evaluate diastolic function due to afib, evidence of RA and RV pressure overload, severe biatrial enlargement, PASP 75. Findings suggests probable significant diastolic dysfunction, I would think her elevated right sided pressures are most likely secondary to this as well. She is negative 1.3 liters yesterday, negative 1.8 liters since admission though I/Os are incomplete. Cr and BUN trending down with diuresis consistent with venous congestion and CHF. Weight reported at 168 lbs, weights historically have been in around mid 140s to low 150s. Admit weight 177 lbs. She We will continue lasix IV 40mg  bid. Agree with increasing hydralazine due to high bp's. Continue dilt and coumadin for afib, follow heart rates and likely consolidate to long acting dilt tomorrow. Significant pulmonary HTN by echo, I would think that based on collection of echo findings this is likely secondary to left sided heart disease/diastolic dysfunction. Can consider repeat echo once euvolemic to reassess pressures as outpatient, or even potentially a RHC as an  outpatient, would f/u repeat pressures prior to undergoing extensive pulm HTN workup. Will defer that consideration to her primary cardiologist at outpatient f/u.    Dominga Ferry MD

## 2015-02-23 NOTE — Progress Notes (Addendum)
Called on call MD for this patient to make them aware that patients HR has not been above 60, therefore I have had to hold the Cardizem, also made the MD aware that patient has lost IV access, there were several attempts made to get another one, will pass this on to the day shift. MD was also made aware of 2 sec pause in heart beat per telemetry.

## 2015-02-23 NOTE — Progress Notes (Signed)
Subjective: She is generally somewhat better. She lost IV access but that has been reestablished. She is down about 2 kg.  Objective: Vital signs in last 24 hours: Temp:  [98.4 F (36.9 C)-99 F (37.2 C)] 98.4 F (36.9 C) (01/16 0554) Pulse Rate:  [54-65] 60 (01/16 0741) Resp:  [17-20] 17 (01/16 0741) BP: (152-186)/(83-90) 186/90 mmHg (01/16 0554) SpO2:  [96 %-99 %] 97 % (01/16 0741) Weight:  [76.567 kg (168 lb 12.8 oz)] 76.567 kg (168 lb 12.8 oz) (01/16 0554) Weight change: -1.814 kg (-4 lb) Last BM Date: 02/16/15 (MD notified )  Intake/Output from previous day: 01/15 0701 - 01/16 0700 In: 720 [P.O.:720] Out: 2075 [Urine:2075]  PHYSICAL EXAM General appearance: alert, cooperative and Very hard of hearing Resp: rales bibasilar Cardio: irregularly irregular rhythm GI: soft, non-tender; bowel sounds normal; no masses,  no organomegaly Extremities: She still has edema but it is less  Lab Results:  Results for orders placed or performed during the hospital encounter of 02/17/15 (from the past 48 hour(s))  Glucose, capillary     Status: None   Collection Time: 02/21/15  8:15 AM  Result Value Ref Range   Glucose-Capillary 92 65 - 99 mg/dL   Comment 1 Notify RN   Protime-INR     Status: Abnormal   Collection Time: 02/21/15  9:03 AM  Result Value Ref Range   Prothrombin Time 24.8 (H) 11.6 - 15.2 seconds   INR 2.27 (H) 0.00 - 1.49  Glucose, capillary     Status: Abnormal   Collection Time: 02/21/15 11:58 AM  Result Value Ref Range   Glucose-Capillary 143 (H) 65 - 99 mg/dL   Comment 1 Notify RN   Glucose, capillary     Status: Abnormal   Collection Time: 02/21/15  4:48 PM  Result Value Ref Range   Glucose-Capillary 152 (H) 65 - 99 mg/dL   Comment 1 Notify RN   Glucose, capillary     Status: Abnormal   Collection Time: 02/21/15  9:23 PM  Result Value Ref Range   Glucose-Capillary 136 (H) 65 - 99 mg/dL   Comment 1 Notify RN    Comment 2 Document in Chart   Basic  metabolic panel     Status: Abnormal   Collection Time: 02/22/15  6:09 AM  Result Value Ref Range   Sodium 140 135 - 145 mmol/L   Potassium 4.1 3.5 - 5.1 mmol/L   Chloride 104 101 - 111 mmol/L   CO2 29 22 - 32 mmol/L   Glucose, Bld 112 (H) 65 - 99 mg/dL   BUN 28 (H) 6 - 20 mg/dL   Creatinine, Ser 1.44 (H) 0.44 - 1.00 mg/dL   Calcium 8.4 (L) 8.9 - 10.3 mg/dL   GFR calc non Af Amer 33 (L) >60 mL/min   GFR calc Af Amer 38 (L) >60 mL/min    Comment: (NOTE) The eGFR has been calculated using the CKD EPI equation. This calculation has not been validated in all clinical situations. eGFR's persistently <60 mL/min signify possible Chronic Kidney Disease.    Anion gap 7 5 - 15  Protime-INR     Status: Abnormal   Collection Time: 02/22/15  6:09 AM  Result Value Ref Range   Prothrombin Time 22.5 (H) 11.6 - 15.2 seconds   INR 1.99 (H) 0.00 - 1.49  Glucose, capillary     Status: None   Collection Time: 02/22/15  7:45 AM  Result Value Ref Range   Glucose-Capillary 90 65 - 99  mg/dL   Comment 1 Notify RN   Glucose, capillary     Status: Abnormal   Collection Time: 02/22/15 12:06 PM  Result Value Ref Range   Glucose-Capillary 133 (H) 65 - 99 mg/dL   Comment 1 Notify RN    Comment 2 Document in Chart   Glucose, capillary     Status: Abnormal   Collection Time: 02/22/15  4:42 PM  Result Value Ref Range   Glucose-Capillary 146 (H) 65 - 99 mg/dL   Comment 1 Notify RN    Comment 2 Document in Chart   Glucose, capillary     Status: Abnormal   Collection Time: 02/22/15  8:29 PM  Result Value Ref Range   Glucose-Capillary 133 (H) 65 - 99 mg/dL   Comment 1 Notify RN    Comment 2 Document in Chart   Protime-INR     Status: Abnormal   Collection Time: 02/23/15  6:05 AM  Result Value Ref Range   Prothrombin Time 19.9 (H) 11.6 - 15.2 seconds   INR 1.69 (H) 0.00 - 1.58  Basic metabolic panel     Status: Abnormal   Collection Time: 02/23/15  6:10 AM  Result Value Ref Range   Sodium 142 135 -  145 mmol/L   Potassium 4.2 3.5 - 5.1 mmol/L   Chloride 102 101 - 111 mmol/L   CO2 33 (H) 22 - 32 mmol/L   Glucose, Bld 98 65 - 99 mg/dL   BUN 27 (H) 6 - 20 mg/dL   Creatinine, Ser 1.37 (H) 0.44 - 1.00 mg/dL   Calcium 9.0 8.9 - 10.3 mg/dL   GFR calc non Af Amer 35 (L) >60 mL/min   GFR calc Af Amer 40 (L) >60 mL/min    Comment: (NOTE) The eGFR has been calculated using the CKD EPI equation. This calculation has not been validated in all clinical situations. eGFR's persistently <60 mL/min signify possible Chronic Kidney Disease.    Anion gap 7 5 - 15  Glucose, capillary     Status: Abnormal   Collection Time: 02/23/15  7:57 AM  Result Value Ref Range   Glucose-Capillary 102 (H) 65 - 99 mg/dL   Comment 1 Notify RN    Comment 2 Document in Chart     ABGS No results for input(s): PHART, PO2ART, TCO2, HCO3 in the last 72 hours.  Invalid input(s): PCO2 CULTURES No results found for this or any previous visit (from the past 240 hour(s)). Studies/Results: Dg Chest 1 View  02/22/2015  CLINICAL DATA:  CHF, type II diabetes mellitus, essential benign hypertension, atrial fibrillation, mixed hyperlipidemia EXAM: CHEST 1 VIEW COMPARISON:  Portable exam 1134 hours compared to 02/20/2015 FINDINGS: Enlargement of cardiac silhouette with pulmonary vascular congestion. Atherosclerotic calcification aorta. Minimal pulmonary edema. Persistent atelectasis versus consolidation LEFT lower lobe. No gross pleural effusion or pneumothorax. Bones unremarkable. IMPRESSION: Enlargement of cardiac silhouette with pulmonary vascular congestion and minimal pulmonary edema/CHF. Persistent LEFT lower lobe atelectasis versus consolidation, little changed. Electronically Signed   By: Lavonia Dana M.D.   On: 02/22/2015 13:47    Medications:  Prior to Admission:  Prescriptions prior to admission  Medication Sig Dispense Refill Last Dose  . amLODipine (NORVASC) 10 MG tablet TAKE (1) TABLET BY MOUTH DAILY. 30 tablet 6  02/17/2015 at Unknown time  . atorvastatin (LIPITOR) 20 MG tablet Take 20 mg by mouth daily.   02/17/2015 at Unknown time  . carvedilol (COREG) 25 MG tablet Take 25 mg by mouth 2 (two) times  daily with a meal.   02/17/2015 at Unknown time  . cephALEXin (KEFLEX) 500 MG capsule Take 500 mg by mouth 3 (three) times daily. 10 day course starting on 02/10/2015   02/17/2015 at Unknown time  . furosemide (LASIX) 40 MG tablet Take 40 mg by mouth daily.    02/17/2015 at Unknown time  . glimepiride (AMARYL) 2 MG tablet Take 2 mg by mouth daily.     02/17/2015 at Unknown time  . HYDROcodone-acetaminophen (NORCO/VICODIN) 5-325 MG tablet Take 1 tablet by mouth 4 (four) times daily.    02/17/2015 at Unknown time  . lisinopril (PRINIVIL,ZESTRIL) 40 MG tablet Take 40 mg by mouth 2 (two) times daily.     02/17/2015 at Unknown time  . metFORMIN (GLUCOPHAGE) 500 MG tablet Take 500 mg by mouth 2 (two) times daily with meals.     02/17/2015 at Unknown time  . solifenacin (VESICARE) 5 MG tablet Take 5 mg by mouth daily.   02/17/2015 at Unknown time  . TRAVATAN Z 0.004 % SOLN ophthalmic solution Place 1 drop into both eyes at bedtime.    02/16/2015 at Unknown time  . warfarin (COUMADIN) 5 MG tablet Take 1 tablet daily or as directed (Patient taking differently: Take 5 mg by mouth daily. ) 45 tablet 3 02/16/2015 at 1800   Scheduled: . antiseptic oral rinse  7 mL Mouth Rinse BID  . atorvastatin  20 mg Oral q1800  . darifenacin  7.5 mg Oral Daily  . diltiazem  30 mg Oral 3 times per day  . glimepiride  2 mg Oral Daily  . hydrALAZINE  25 mg Oral 3 times per day  . insulin aspart  0-5 Units Subcutaneous QHS  . insulin aspart  0-9 Units Subcutaneous TID WC  . latanoprost  1 drop Both Eyes QHS  . levalbuterol  0.63 mg Nebulization TID  . lisinopril  20 mg Oral BID  . potassium chloride  40 mEq Oral BID  . sodium chloride  3 mL Intravenous Q12H  . torsemide  40 mg Oral BID  . warfarin  4 mg Oral Once  . Warfarin - Pharmacist Dosing  Inpatient   Does not apply Q24H   Continuous:  OQH:[id number] **OR** acetaminophen, levalbuterol, ondansetron **OR** ondansetron (ZOFRAN) IV, polyethylene glycol  Assesment: She was admitted with heart failure. She has chronic atrial fibrillation. She has chronic venous insufficiency. Her weight was up significantly and is starting to come down. She is diuresing better. She will need outpatient PFTs to see if she does have an element of COPD. Principal Problem:   CHF (congestive heart failure) (HCC) Active Problems:   Mixed hyperlipidemia   Essential hypertension, benign   Atrial fibrillation ([place])   Long term current use of anticoagulant   Tricuspid valve regurgitation   Chronic venous insufficiency    Plan:per cardiology    LOS: 6 days   Zanden Colver L 02/23/2015, 8:13 AM

## 2015-02-23 NOTE — Progress Notes (Signed)
ANTICOAGULATION CONSULT NOTE   Pharmacy Consult for Coumadin Indication: atrial fibrillation  No Known Allergies  Patient Measurements: Height: 5\' 6"  (167.6 cm) Weight: 168 lb 12.8 oz (76.567 kg) IBW/kg (Calculated) : 59.3  Vital Signs: Temp: 98.4 F (36.9 C) (01/16 0554) Temp Source: Oral (01/16 0554) BP: 186/90 mmHg (01/16 0554) Pulse Rate: 60 (01/16 0741)  Labs:  Recent Labs  02/21/15 0129 02/21/15 0903 02/22/15 0609 02/23/15 0605 02/23/15 0610  LABPROT  --  24.8* 22.5* 19.9*  --   INR  --  2.27* 1.99* 1.69*  --   CREATININE 1.62*  --  1.44*  --  1.37*    Estimated Creatinine Clearance: 33.1 mL/min (by C-G formula based on Cr of 1.37).  Medical History: Past Medical History  Diagnosis Date  . Essential hypertension, benign   . Atrial fibrillation (HCC)   . Type 2 diabetes mellitus (HCC)   . Mixed hyperlipidemia   Medications:   Prescriptions prior to admission  Medication Sig Dispense Refill Last Dose  . amLODipine (NORVASC) 10 MG tablet TAKE (1) TABLET BY MOUTH DAILY. 30 tablet 6 02/17/2015 at Unknown time  . atorvastatin (LIPITOR) 20 MG tablet Take 20 mg by mouth daily.   02/17/2015 at Unknown time  . carvedilol (COREG) 25 MG tablet Take 25 mg by mouth 2 (two) times daily with a meal.   02/17/2015 at Unknown time  . cephALEXin (KEFLEX) 500 MG capsule Take 500 mg by mouth 3 (three) times daily. 10 day course starting on 02/10/2015   02/17/2015 at Unknown time  . furosemide (LASIX) 40 MG tablet Take 40 mg by mouth daily.    02/17/2015 at Unknown time  . glimepiride (AMARYL) 2 MG tablet Take 2 mg by mouth daily.     02/17/2015 at Unknown time  . HYDROcodone-acetaminophen (NORCO/VICODIN) 5-325 MG tablet Take 1 tablet by mouth 4 (four) times daily.    02/17/2015 at Unknown time  . lisinopril (PRINIVIL,ZESTRIL) 40 MG tablet Take 40 mg by mouth 2 (two) times daily.     02/17/2015 at Unknown time  . metFORMIN (GLUCOPHAGE) 500 MG tablet Take 500 mg by mouth 2 (two) times daily  with meals.     02/17/2015 at Unknown time  . solifenacin (VESICARE) 5 MG tablet Take 5 mg by mouth daily.   02/17/2015 at Unknown time  . TRAVATAN Z 0.004 % SOLN ophthalmic solution Place 1 drop into both eyes at bedtime.    02/16/2015 at Unknown time  . warfarin (COUMADIN) 5 MG tablet Take 1 tablet daily or as directed (Patient taking differently: Take 5 mg by mouth daily. ) 45 tablet 3 02/16/2015 at 1800   Assessment: 82 yo female on chronic anticoagulation for afib. Pt admitted for new onset of CHF. Plan to continue with coumadin. INR below therapeutic range and trending down.  Goal of Therapy:  INR 2-3 Monitor platelets by anticoagulation protocol: Yes   Plan:  Coumadin 7.5mg  po today Monitor for S/S of bleeding PT/INR daily  Thanks for allowing pharmacy to be a part of this patient's care.  Valrie HartScott Alexandra Posadas, PharmD Clinical Pharmacist 02/23/2015,8:29 AM

## 2015-02-24 DIAGNOSIS — I5033 Acute on chronic diastolic (congestive) heart failure: Secondary | ICD-10-CM

## 2015-02-24 LAB — BASIC METABOLIC PANEL
ANION GAP: 7 (ref 5–15)
BUN: 29 mg/dL — ABNORMAL HIGH (ref 6–20)
CALCIUM: 8.7 mg/dL — AB (ref 8.9–10.3)
CO2: 34 mmol/L — ABNORMAL HIGH (ref 22–32)
Chloride: 102 mmol/L (ref 101–111)
Creatinine, Ser: 1.47 mg/dL — ABNORMAL HIGH (ref 0.44–1.00)
GFR calc Af Amer: 37 mL/min — ABNORMAL LOW (ref >60)
GFR, EST NON AFRICAN AMERICAN: 32 mL/min — AB (ref >60)
GLUCOSE: 123 mg/dL — AB (ref 65–99)
Potassium: 4.4 mmol/L (ref 3.5–5.1)
SODIUM: 143 mmol/L (ref 135–145)

## 2015-02-24 LAB — GLUCOSE, CAPILLARY
GLUCOSE-CAPILLARY: 99 mg/dL (ref 65–99)
Glucose-Capillary: 112 mg/dL — ABNORMAL HIGH (ref 65–99)
Glucose-Capillary: 199 mg/dL — ABNORMAL HIGH (ref 65–99)
Glucose-Capillary: 162 mg/dL — ABNORMAL HIGH (ref 65–99)

## 2015-02-24 LAB — PROTIME-INR
INR: 1.93 — ABNORMAL HIGH (ref 0.00–1.49)
PROTHROMBIN TIME: 22 s — AB (ref 11.6–15.2)

## 2015-02-24 MED ORDER — HYDROCODONE-HOMATROPINE 5-1.5 MG/5ML PO SYRP
5.0000 mL | ORAL_SOLUTION | Freq: Four times a day (QID) | ORAL | Status: DC | PRN
  Administered 2015-02-24 (×2): 5 mL via ORAL
  Filled 2015-02-24 (×2): qty 5

## 2015-02-24 MED ORDER — DILTIAZEM HCL 30 MG PO TABS
30.0000 mg | ORAL_TABLET | Freq: Two times a day (BID) | ORAL | Status: DC
  Administered 2015-02-24 – 2015-02-25 (×2): 30 mg via ORAL
  Filled 2015-02-24 (×2): qty 1

## 2015-02-24 MED ORDER — LEVALBUTEROL HCL 0.63 MG/3ML IN NEBU: 0.6300 mg | INHALATION_SOLUTION | Freq: Four times a day (QID) | RESPIRATORY_TRACT | Status: DC | PRN

## 2015-02-24 MED ORDER — WARFARIN SODIUM 5 MG PO TABS
5.0000 mg | ORAL_TABLET | Freq: Once | ORAL | Status: AC
  Administered 2015-02-24: 5 mg via ORAL
  Filled 2015-02-24: qty 1

## 2015-02-24 NOTE — Progress Notes (Signed)
Patient ID: ADAIA MATTHIES, female   DOB: [birth year], 82 y.o.   MRN: [id number]      Subjective:    SOB improving, but not resolved  Objective:   Temp:  [98.3 F (36.8 C)-98.6 F (37 C)] 98.6 F (37 C) (01/17 0549) Pulse Rate:  [49-96] 62 (01/17 0549) Resp:  [16-17] 17 (01/17 0549) BP: (146-173)/(50-72) 167/50 mmHg (01/17 0549) SpO2:  [94 %-100 %] 96 % (01/17 0836) Weight:  [166 lb (75.297 kg)] 166 lb (75.297 kg) (01/17 0549) Last BM Date: 02/23/15  Filed Weights   02/22/15 0700 02/23/15 0554 02/24/15 0549  Weight: 172 lb 12.8 oz (78.382 kg) 168 lb 12.8 oz (76.567 kg) 166 lb (75.297 kg)    Intake/Output Summary (Last 24 hours) at 02/24/15 0856 Last data filed at 02/24/15 0553  Gross per 24 hour  Intake    483 ml  Output   1750 ml  Net  -1267 ml    Telemetry: afib with low rates.   Exam:  General: NAD  Resp: CTAB  Cardiac: irreg, no m/r/g, no jvd  GI: abdomen soft, NT, ND  MSK: 1+ bilateral LE edema  Neuro: no focal deficits  Psych: appropriate affect  Lab Results:  Basic Metabolic Panel:  Recent Labs Lab 02/22/15 0609 02/23/15 0610 02/24/15 0704  NA 140 142 143  K 4.1 4.2 4.4  CL 104 102 102  CO2 29 33* 34*  GLUCOSE 112* 98 123*  BUN 28* 27* 29*  CREATININE 1.44* 1.37* 1.47*  CALCIUM 8.4* 9.0 8.7*    Liver Function Tests: No results for input(s): AST, ALT, ALKPHOS, BILITOT, PROT, ALBUMIN in the last 168 hours.  CBC:  Recent Labs Lab 02/17/15 1759  WBC 8.6  HGB 11.3*  HCT 34.5*  MCV 97.5  PLT 235    Cardiac Enzymes:  Recent Labs Lab 02/17/15 1829  TROPONINI 0.05*    BNP: No results for input(s): PROBNP in the last 8760 hours.  Coagulation:  Recent Labs Lab 02/22/15 0609 02/23/15 0605 02/24/15 0613  INR 1.99* 1.69* 1.93*       Medications:   Scheduled Medications: . antiseptic oral rinse  7 mL Mouth Rinse BID  . atorvastatin  20 mg Oral q1800  . darifenacin  7.5 mg Oral Daily  . diltiazem  30 mg Oral 3  times per day  . furosemide  40 mg Intravenous Q12H  . glimepiride  2 mg Oral Daily  . hydrALAZINE  25 mg Oral 3 times per day  . insulin aspart  0-5 Units Subcutaneous QHS  . insulin aspart  0-9 Units Subcutaneous TID WC  . latanoprost  1 drop Both Eyes QHS  . lisinopril  20 mg Oral BID  . potassium chloride  40 mEq Oral BID  . sodium chloride  3 mL Intravenous Q12H  . warfarin  4 mg Oral Once  . Warfarin - Pharmacist Dosing Inpatient   Does not apply Q24H     Infusions:     PRN Medications:  acetaminophen **OR** acetaminophen, HYDROcodone-homatropine, levalbuterol, ondansetron **OR** ondansetron (ZOFRAN) IV, polyethylene glycol     Assessment/Plan    1. Acute on chronic diastolic HF - Echo 02/2015 LVEF 60-65%, cannot evaluate diastolic function due to afib, evidence of RA and RV pressure overload, severe biatrial enlargement, PASP 75. Findings suggests probable significant diastolic dysfunction, I would think her elevated right sided pressures are most likely secondary to this as well - negative 1 liter yesterday, negative 3 liters since admission. Cr and  BUN up and down but fairly stable range since admission. She is on lasix IV  bid.  - Weight reported at 166 lbs, weights historically have been in around mid 140s to low 150s. Admit weight 177 lbs. - will dose lasix  IV bid today.  2. HTN - hydralazine increased to  tid yesterday, follow bp's today, room to further titrate as needed  3. Pulmonary hypertension - evidence of RA and RV pressure overload, severe biatrial enlargement, PASP 75 - I would think that based on collection of echo findings this is likely secondary to left sided heart disease/diastolic dysfunction. Can consider repeat echo once euvolemic to reassess pressures as outpatient, or even potentially a RHC as an outpatient, would f/u repeat pressures prior to undergoing extensive pulm HTN workup. Will defer that consideration to her primary  cardiologist at outpatient f/u.   4. CKD - admit Cr 1.37, only prior we have to that is 4 years ago that was 0.97. Cr somewhat up and down but fairly stable range between 1.35-1.45. Continue to follow.   5. Afib - patient with some wheezing this admit, she was changed from beta blocker to short acting dilt - remains on coumadin for stroke prevention - some low heart rates at times, will decrease dilt to  bid.    Agree with getting patient up and walking today with PT. Pending on her symptoms can consider discharge I would think likely tomorrow on oral diuretics with close follow up. Would favor watching her heart rates and bp an additional day as well given recent med changes.     Dina Rich, M.D.

## 2015-02-24 NOTE — Evaluation (Signed)
Physical Therapy Evaluation Patient Details Name: Melissa [name] MRN: [id number] DOB: September 19, 1930 Today's Date: 02/24/2015   History of Present Illness  Melissa [name] is an 82 y.o. female with hx of moderate TR, afib on anticoagulaiton, DM2, HLD, HTN, presented to the ER with upper respiratory symptoms for the past 2 weeks. She had seen Dr Diona Browner for pedal edema in Oct and was thought they were dependent edema, aggravated with Norvasc. She denied fever, chills, productive coughs, orthopnea or PND. Evaluation in the ER showed EKG with nonspecific ST T changes, elevated BNP to the 900's, negative troponins, and CXR showed vascular congestion. Her WBC is normal, and her Cr was 1.37 with normal K. She was given IV Lasix, diuresed, felt better and hospitlaist was asked to admit her for new onset of CHF.   Clinical Impression  Pt was seen for evaluation, sitting at EOB and reports feeling much better.  Granddaughters were present and state that pt lives alone but that they will all be taking turns checking on her more during the day.  She has been independent using a cane at home.  She used to use a walker but has lost it.  Currently, her strength is WNL as is joint ROM.  She is independent in transfers and able to ambulate with a cane for 150', stable gait.  She consistently reaches with her other hand for railings, walls, etc.  Which indicates a need for 2 hand support.  I would like to have a rollator ordered for her so that she can sit when fatigued.  She tolerated gait well with no dyspnea.  Family is planning to assist her with gait while her in the hospital.    Follow Up Recommendations No PT follow up    Equipment Recommendations   (/Rollator walker)    Recommendations for Other Services   none    Precautions / Restrictions Precautions Precautions: None Restrictions Weight Bearing Restrictions: No      Mobility  Bed Mobility Overal bed mobility: Modified Independent                 Transfers Overall transfer level: Modified independent                  Ambulation/Gait Ambulation/Gait assistance: Modified independent (Device/Increase time) Ambulation Distance (Feet): 150 Feet Assistive device: Rolling walker (2 wheeled);Straight cane Gait Pattern/deviations: WFL(Within Functional Limits)   Gait velocity interpretation: >2.62 ft/sec, indicative of independent community ambulator General Gait Details: often reaches with the right hand for railings as walking down the hallway...reaches for furniture while at home...would benefit from having a walker  Stairs            Wheelchair Mobility    Modified Rankin (Stroke Patients Only)       Balance Overall balance assessment: Modified Independent;No apparent balance deficits (not formally assessed)                                           Pertinent Vitals/Pain Pain Assessment: No/denies pain    Home Living Family/patient expects to be discharged to:: Private residence Living Arrangements: Alone Available Help at Discharge: Family;Available PRN/intermittently Type of Home: House Home Access: Level entry     Home Layout: One level Home Equipment: Cane - single point      Prior Function Level of Independence: Independent with assistive device(s)  Hand Dominance   Dominant Hand: Left    Extremity/Trunk Assessment               Lower Extremity Assessment: Overall WFL for tasks assessed      Cervical / Trunk Assessment: Kyphotic  Communication   Communication: HOH  Cognition Arousal/Alertness: Awake/alert Behavior During Therapy: WFL for tasks assessed/performed Overall Cognitive Status: Within Functional Limits for tasks assessed                      General Comments      Exercises        Assessment/Plan    PT Assessment Patent does not need any further PT services  PT Diagnosis     PT Problem List     PT Treatment Interventions     PT Goals (Current goals can be found in the Care Plan section) Acute Rehab PT Goals PT Goal Formulation: All assessment and education complete, DC therapy    Frequency     Barriers to discharge        Co-evaluation               End of Session Equipment Utilized During Treatment: Gait belt Activity Tolerance: Patient tolerated treatment well Patient left: in bed;with call bell/phone within reach;with family/visitor present Nurse Communication: Mobility status         Time: 1350-1419 PT Time Calculation (min) (ACUTE ONLY): 29 min   Charges:   PT Evaluation $PT Eval Low Complexity: 1 Procedure     PT G CodesMyrlene [name] L  PT 02/24/2015, 2:25 PM [phone]

## 2015-02-24 NOTE — Progress Notes (Signed)
Subjective: She feels better. She has less fluid retention now. Her weight is down another kilogram or so. She diuresed greater than a liter yesterday. She is still coughing and requests cough medication  Objective: Vital signs in last 24 hours: Temp:  [98.3 F (36.8 C)-98.6 F (37 C)] 98.6 F (37 C) (01/17 0549) Pulse Rate:  [49-96] 62 (01/17 0549) Resp:  [16-17] 17 (01/17 0549) BP: (146-173)/(50-72) 167/50 mmHg (01/17 0549) SpO2:  [94 %-100 %] 96 % (01/17 0836) Weight:  [75.297 kg (166 lb)] 75.297 kg (166 lb) (01/17 0549) Weight change: -1.27 kg (-2 lb 12.8 oz) Last BM Date: 02/23/15  Intake/Output from previous day: 01/16 0701 - 01/17 0700 In: 723 [P.O.:720; I.V.:3] Out: 1750 [Urine:1750]  PHYSICAL EXAM General appearance: alert, cooperative and mild distress Resp: She still has fine rales in the bases but much less Cardio: irregularly irregular rhythm GI: soft, non-tender; bowel sounds normal; no masses,  no organomegaly Extremities: She still has edema but much less  Lab Results:  Results for orders placed or performed during the hospital encounter of 02/17/15 (from the past 48 hour(s))  Glucose, capillary     Status: Abnormal   Collection Time: 02/22/15 12:06 PM  Result Value Ref Range   Glucose-Capillary 133 (H) 65 - 99 mg/dL   Comment 1 Notify RN    Comment 2 Document in Chart   Glucose, capillary     Status: Abnormal   Collection Time: 02/22/15  4:42 PM  Result Value Ref Range   Glucose-Capillary 146 (H) 65 - 99 mg/dL   Comment 1 Notify RN    Comment 2 Document in Chart   Glucose, capillary     Status: Abnormal   Collection Time: 02/22/15  8:29 PM  Result Value Ref Range   Glucose-Capillary 133 (H) 65 - 99 mg/dL   Comment 1 Notify RN    Comment 2 Document in Chart   Protime-INR     Status: Abnormal   Collection Time: 02/23/15  6:05 AM  Result Value Ref Range   Prothrombin Time 19.9 (H) 11.6 - 15.2 seconds   INR 1.69 (H) 0.00 - 4.31  Basic metabolic  panel     Status: Abnormal   Collection Time: 02/23/15  6:10 AM  Result Value Ref Range   Sodium 142 135 - 145 mmol/L   Potassium 4.2 3.5 - 5.1 mmol/L   Chloride 102 101 - 111 mmol/L   CO2 33 (H) 22 - 32 mmol/L   Glucose, Bld 98 65 - 99 mg/dL   BUN 27 (H) 6 - 20 mg/dL   Creatinine, Ser 1.37 (H) 0.44 - 1.00 mg/dL   Calcium 9.0 8.9 - 10.3 mg/dL   GFR calc non Af Amer 35 (L) >60 mL/min   GFR calc Af Amer 40 (L) >60 mL/min    Comment: (NOTE) The eGFR has been calculated using the CKD EPI equation. This calculation has not been validated in all clinical situations. eGFR's persistently <60 mL/min signify possible Chronic Kidney Disease.    Anion gap 7 5 - 15  Glucose, capillary     Status: Abnormal   Collection Time: 02/23/15  7:57 AM  Result Value Ref Range   Glucose-Capillary 102 (H) 65 - 99 mg/dL   Comment 1 Notify RN    Comment 2 Document in Chart   Glucose, capillary     Status: Abnormal   Collection Time: 02/23/15 11:35 AM  Result Value Ref Range   Glucose-Capillary 157 (H) 65 - 99 mg/dL  Comment 1 Notify RN    Comment 2 Document in Chart   Glucose, capillary     Status: Abnormal   Collection Time: 02/23/15  4:37 PM  Result Value Ref Range   Glucose-Capillary 141 (H) 65 - 99 mg/dL   Comment 1 Notify RN    Comment 2 Document in Chart   Glucose, capillary     Status: Abnormal   Collection Time: 02/23/15  8:28 PM  Result Value Ref Range   Glucose-Capillary 157 (H) 65 - 99 mg/dL  Protime-INR     Status: Abnormal   Collection Time: 02/24/15  6:13 AM  Result Value Ref Range   Prothrombin Time 22.0 (H) 11.6 - 15.2 seconds   INR 1.93 (H) 0.00 - 9.56  Basic metabolic panel     Status: Abnormal   Collection Time: 02/24/15  7:04 AM  Result Value Ref Range   Sodium 143 135 - 145 mmol/L   Potassium 4.4 3.5 - 5.1 mmol/L   Chloride 102 101 - 111 mmol/L   CO2 34 (H) 22 - 32 mmol/L   Glucose, Bld 123 (H) 65 - 99 mg/dL   BUN 29 (H) 6 - 20 mg/dL   Creatinine, Ser 1.47 (H) 0.44 -  1.00 mg/dL   Calcium 8.7 (L) 8.9 - 10.3 mg/dL   GFR calc non Af Amer 32 (L) >60 mL/min   GFR calc Af Amer 37 (L) >60 mL/min    Comment: (NOTE) The eGFR has been calculated using the CKD EPI equation. This calculation has not been validated in all clinical situations. eGFR's persistently <60 mL/min signify possible Chronic Kidney Disease.    Anion gap 7 5 - 15  Glucose, capillary     Status: Abnormal   Collection Time: 02/24/15  7:49 AM  Result Value Ref Range   Glucose-Capillary 112 (H) 65 - 99 mg/dL   Comment 1 Notify RN    Comment 2 Document in Chart     ABGS No results for input(s): PHART, PO2ART, TCO2, HCO3 in the last 72 hours.  Invalid input(s): PCO2 CULTURES No results found for this or any previous visit (from the past 240 hour(s)). Studies/Results: Dg Chest 1 View  02/22/2015  CLINICAL DATA:  CHF, type II diabetes mellitus, essential benign hypertension, atrial fibrillation, mixed hyperlipidemia EXAM: CHEST 1 VIEW COMPARISON:  Portable exam 1134 hours compared to 02/20/2015 FINDINGS: Enlargement of cardiac silhouette with pulmonary vascular congestion. Atherosclerotic calcification aorta. Minimal pulmonary edema. Persistent atelectasis versus consolidation LEFT lower lobe. No gross pleural effusion or pneumothorax. Bones unremarkable. IMPRESSION: Enlargement of cardiac silhouette with pulmonary vascular congestion and minimal pulmonary edema/CHF. Persistent LEFT lower lobe atelectasis versus consolidation, little changed. Electronically Signed   By: Lavonia Dana M.D.   On: 02/22/2015 13:47    Medications:  Prior to Admission:  Prescriptions prior to admission  Medication Sig Dispense Refill Last Dose  . amLODipine (NORVASC) 10 MG tablet TAKE (1) TABLET BY MOUTH DAILY. 30 tablet 6 02/17/2015 at Unknown time  . atorvastatin (LIPITOR) 20 MG tablet Take 20 mg by mouth daily.   02/17/2015 at Unknown time  . carvedilol (COREG) 25 MG tablet Take 25 mg by mouth 2 (two) times daily  with a meal.   02/17/2015 at Unknown time  . cephALEXin (KEFLEX) 500 MG capsule Take 500 mg by mouth 3 (three) times daily. 10 day course starting on 02/10/2015   02/17/2015 at Unknown time  . furosemide (LASIX) 40 MG tablet Take 40 mg by mouth daily.  02/17/2015 at Unknown time  . glimepiride (AMARYL) 2 MG tablet Take 2 mg by mouth daily.     02/17/2015 at Unknown time  . HYDROcodone-acetaminophen (NORCO/VICODIN) 5-325 MG tablet Take 1 tablet by mouth 4 (four) times daily.    02/17/2015 at Unknown time  . lisinopril (PRINIVIL,ZESTRIL) 40 MG tablet Take 40 mg by mouth 2 (two) times daily.     02/17/2015 at Unknown time  . metFORMIN (GLUCOPHAGE) 500 MG tablet Take 500 mg by mouth 2 (two) times daily with meals.     02/17/2015 at Unknown time  . solifenacin (VESICARE) 5 MG tablet Take 5 mg by mouth daily.   02/17/2015 at Unknown time  . TRAVATAN Z 0.004 % SOLN ophthalmic solution Place 1 drop into both eyes at bedtime.    02/16/2015 at Unknown time  . warfarin (COUMADIN) 5 MG tablet Take 1 tablet daily or as directed (Patient taking differently: Take 5 mg by mouth daily. ) 45 tablet 3 02/16/2015 at 1800   Scheduled: . antiseptic oral rinse  7 mL Mouth Rinse BID  . atorvastatin  20 mg Oral q1800  . darifenacin  7.5 mg Oral Daily  . diltiazem  30 mg Oral 3 times per day  . furosemide  40 mg Intravenous Q12H  . glimepiride  2 mg Oral Daily  . hydrALAZINE  25 mg Oral 3 times per day  . insulin aspart  0-5 Units Subcutaneous QHS  . insulin aspart  0-9 Units Subcutaneous TID WC  . latanoprost  1 drop Both Eyes QHS  . lisinopril  20 mg Oral BID  . potassium chloride  40 mEq Oral BID  . sodium chloride  3 mL Intravenous Q12H  . warfarin  4 mg Oral Once  . Warfarin - Pharmacist Dosing Inpatient   Does not apply Q24H   Continuous:  JSH:FWYOVZCHYIFOY **OR** acetaminophen, HYDROcodone-homatropine, levalbuterol, ondansetron **OR** ondansetron (ZOFRAN) IV, polyethylene glycol  Assesment: She was admitted with  heart failure. She has chronic atrial fibrillation and is chronically anticoagulated. She has pulmonary hypertension that is probably related to left ventricular dysfunction. She was admitted with worsened renal function by that had improved. Creatinine has bumped back up a little bit this morning. She is diuresing well. Principal Problem:   CHF (congestive heart failure) (HCC) Active Problems:   Mixed hyperlipidemia   Essential hypertension, benign   Atrial fibrillation ([place])   Long term current use of anticoagulant   Tricuspid valve regurgitation   Chronic venous insufficiency    Plan: I requested physical therapy consultation to be sure if she might need rehabilitation because she is weak. Continue with other treatments. Discuss with cardiology about what her goal weight should be.    LOS: 7 days   Melissa [name] L 02/24/2015, 8:48 AM

## 2015-02-24 NOTE — Progress Notes (Signed)
ANTICOAGULATION CONSULT NOTE   Pharmacy Consult for Coumadin Indication: atrial fibrillation  No Known Allergies  Patient Measurements: Height:  (167.6 cm) Weight: 166 lb (75.297 kg) IBW/kg (Calculated) : 59.3  Vital Signs: Temp: 98.6 F (37 C) (01/17 0549) Temp Source: Oral (01/17 0549) BP: 133/64 mmHg (01/17 1041) Pulse Rate: 60 (01/17 1041)  Labs:  Recent Labs  02/22/15 0609 02/23/15 0605 02/23/15 0610 02/24/15 0613 02/24/15 0704  LABPROT 22.5* 19.9*  --  22.0*  --   INR 1.99* 1.69*  --  1.93*  --   CREATININE 1.44*  --  1.37*  --  1.47*    Estimated Creatinine Clearance: 30.6 mL/min (by C-G formula based on Cr of 1.47).  Medical History: Past Medical History  Diagnosis Date  . Essential hypertension, benign   . Atrial fibrillation (HCC)   . Type 2 diabetes mellitus (HCC)   . Mixed hyperlipidemia   Medications:   Prescriptions prior to admission  Medication Sig Dispense Refill Last Dose  . amLODipine (NORVASC) 10 MG tablet TAKE (1) TABLET BY MOUTH DAILY. 30 tablet 6 02/17/2015 at Unknown time  . atorvastatin (LIPITOR) 20 MG tablet Take 20 mg by mouth daily.   02/17/2015 at Unknown time  . carvedilol (COREG) 25 MG tablet Take 25 mg by mouth 2 (two) times daily with a meal.   02/17/2015 at Unknown time  . cephALEXin (KEFLEX) 500 MG capsule Take 500 mg by mouth 3 (three) times daily. 10 day course starting on 02/10/2015   02/17/2015 at Unknown time  . furosemide (LASIX) 40 MG tablet Take 40 mg by mouth daily.    02/17/2015 at Unknown time  . glimepiride (AMARYL) 2 MG tablet Take 2 mg by mouth daily.     02/17/2015 at Unknown time  . HYDROcodone-acetaminophen (NORCO/VICODIN) 5-325 MG tablet Take 1 tablet by mouth 4 (four) times daily.    02/17/2015 at Unknown time  . lisinopril (PRINIVIL,ZESTRIL) 40 MG tablet Take 40 mg by mouth 2 (two) times daily.     02/17/2015 at Unknown time  . metFORMIN (GLUCOPHAGE) 500 MG tablet Take 500 mg by mouth 2 (two) times daily with  meals.     02/17/2015 at Unknown time  . solifenacin (VESICARE) 5 MG tablet Take 5 mg by mouth daily.   02/17/2015 at Unknown time  . TRAVATAN Z 0.004 % SOLN ophthalmic solution Place 1 drop into both eyes at bedtime.    02/16/2015 at Unknown time  . warfarin (COUMADIN) 5 MG tablet Take 1 tablet daily or as directed (Patient taking differently: Take 5 mg by mouth daily. ) 45 tablet 3 02/16/2015 at 1800   Assessment: 82 yo female on chronic anticoagulation for afib. Pt admitted for new onset of CHF. Plan to continue with coumadin. INR had decreased due to dose missed on 1/13 but trending up now.  No bleeding reported  Goal of Therapy:  INR 2-3 Monitor platelets by anticoagulation protocol: Yes   Plan:  Coumadin  po today Monitor for S/S of bleeding PT/INR daily  Thanks for allowing pharmacy to be a part of this patient's care.  Talbert Cage, PharmD Clinical Pharmacist 02/24/2015,10:54 AM

## 2015-02-25 LAB — GLUCOSE, CAPILLARY
GLUCOSE-CAPILLARY: 116 mg/dL — AB (ref 65–99)
Glucose-Capillary: 141 mg/dL — ABNORMAL HIGH (ref 65–99)

## 2015-02-25 LAB — BASIC METABOLIC PANEL
ANION GAP: 9 (ref 5–15)
BUN: 28 mg/dL — AB (ref 6–20)
CHLORIDE: 100 mmol/L — AB (ref 101–111)
CO2: 32 mmol/L (ref 22–32)
Calcium: 9.1 mg/dL (ref 8.9–10.3)
Creatinine, Ser: 1.32 mg/dL — ABNORMAL HIGH (ref 0.44–1.00)
GFR calc non Af Amer: 36 mL/min — ABNORMAL LOW (ref >60)
GFR, EST AFRICAN AMERICAN: 42 mL/min — AB (ref >60)
Glucose, Bld: 126 mg/dL — ABNORMAL HIGH (ref 65–99)
POTASSIUM: 4.7 mmol/L (ref 3.5–5.1)
SODIUM: 141 mmol/L (ref 135–145)

## 2015-02-25 LAB — PROTIME-INR
INR: 1.95 — ABNORMAL HIGH (ref 0.00–1.49)
Prothrombin Time: 22.1 seconds — ABNORMAL HIGH (ref 11.6–15.2)

## 2015-02-25 MED ORDER — HYDRALAZINE HCL 25 MG PO TABS
37.5000 mg | ORAL_TABLET | Freq: Three times a day (TID) | ORAL | Status: DC
  Administered 2015-02-25: 37.5 mg via ORAL
  Filled 2015-02-25: qty 2

## 2015-02-25 MED ORDER — WARFARIN SODIUM 5 MG PO TABS
5.0000 mg | ORAL_TABLET | Freq: Once | ORAL | Status: AC
  Administered 2015-02-25: 5 mg via ORAL
  Filled 2015-02-25: qty 1

## 2015-02-25 MED ORDER — TORSEMIDE 20 MG PO TABS: 20.0000 mg | ORAL_TABLET | Freq: Two times a day (BID) | ORAL | Status: DC

## 2015-02-25 MED ORDER — POTASSIUM CHLORIDE CRYS ER 20 MEQ PO TBCR: 40.0000 meq | EXTENDED_RELEASE_TABLET | Freq: Two times a day (BID) | ORAL | Status: DC

## 2015-02-25 MED ORDER — HYDRALAZINE HCL 50 MG PO TABS: 50.0000 mg | ORAL_TABLET | Freq: Three times a day (TID) | ORAL | Status: DC

## 2015-02-25 MED ORDER — DILTIAZEM HCL ER COATED BEADS 120 MG PO CP24: 120.0000 mg | ORAL_CAPSULE | Freq: Every day | ORAL | Status: DC

## 2015-02-25 NOTE — Progress Notes (Signed)
Subjective:  A little short of breath with walking yesterday eased with sitting down. She says she tried to go too fast.  Objective:  Vital Signs in the last 24 hours: Temp:  [98.2 F (36.8 C)-98.6 F (37 C)] 98.2 F (36.8 C) (01/18 0336) Pulse Rate:  [56-69] 66 (01/18 0336) Resp:  [16-20] 20 (01/18 0336) BP: (133-193)/(57-89) 193/64 mmHg (01/18 0336) SpO2:  [92 %-95 %] 92 % (01/18 0812) Weight:  [160 lb 12.8 oz (72.938 kg)] 160 lb 12.8 oz (72.938 kg) (01/18 0336)  Intake/Output from previous day: 01/17 0701 - 01/18 0700 In: 483 [P.O.:480; I.V.:3] Out: 1300 [Urine:1300] Intake/Output from this shift: Total I/O In: -  Out: 1000 [Urine:1000]  Physical Exam: NECK: Increased JVD, HJR, no bruit LUNGS: Decreased breath sounds with crackles at bases. HEART: Irregular rate and rhythm, 2/6 systolic murmur LSB,S4, no rub, bruit, thrill, or heave EXTREMITIES: Without cyanosis, clubbing, or edema   Lab Results: No results for input(s): WBC, HGB, PLT in the last 72 hours.  Recent Labs  02/24/15 0704 02/25/15 0603  NA 143 141  K 4.4 4.7  CL 102 100*  CO2 34* 32  GLUCOSE 123* 126*  BUN 29* 28*  CREATININE 1.47* 1.32*   No results for input(s): TROPONINI in the last 72 hours.  Invalid input(s): CK, MB Hepatic Function Panel No results for input(s): PROT, ALBUMIN, AST, ALT, ALKPHOS, BILITOT, BILIDIR, IBILI in the last 72 hours. No results for input(s): CHOL in the last 72 hours. No results for input(s): PROTIME in the last 72 hours.    Cardiac Studies:  Assessment/Plan:  1. Acute on chronic diastolic HF - Echo 02/2015 LVEF 60-65%, cannot evaluate diastolic function due to afib, evidence of RA and RV pressure overload, severe biatrial enlargement, PASP 75. Findings suggests probable significant diastolic dysfunction, I would think her elevated right sided pressures are most likely secondary to this as well - negative 817cc yesterday,1 liter this am, negative 4.7 liters since  admission. Cr and BUN up and down but fairly stable range since admission.  - Weight reported at 160 lbs, weights historically have been in around mid 140s to low 150s. Admit weight 177 lbs. - recieved lasix  IV bid yesterday. Switch to po today. Was only on Lasix 40 mg po daily pta.  2. HTN - hydralazine increased to  tid yesterday,BP up this am. room to further titrate as needed  3. Pulmonary hypertension - evidence of RA and RV pressure overload, severe biatrial enlargement, PASP 75 - I would think that based on collection of echo findings this is likely secondary to left sided heart disease/diastolic dysfunction. Can consider repeat echo once euvolemic to reassess pressures as outpatient, or even potentially a RHC as an outpatient, would f/u repeat pressures prior to undergoing extensive pulm HTN workup. Will defer that consideration to her primary cardiologist at outpatient f/u.   4. CKD - admit Cr 1.37, only prior we have to that is 4 years ago that was 0.97. Cr somewhat up and down but fairly stable range between 1.32-1.45. Continue to follow.   5. Afib - patient with some wheezing this admit, she was changed from beta blocker to short acting dilt - remains on coumadin for stroke prevention - some low heart rates at times, will decrease dilt to  bid.    BP up this am. ? Watch today on oral diuretics. Early f/u with Dr. Diona Browner.      LOS: 8 days    Jacolyn Reedy 02/25/2015,  8:38 AM   Patient seen and discussed with PA Geni Bers, I agree with her documentation. For her acute on chronic diastolic heart failure she is negative 800 mL yesterday, negative 4.7 liters since admission. Renal function overall stable. She is on lasix IV  bid. We will change to toresmide  bid. She ambulated yesterday with only mild symptoms. Still remains volume overloaded but reasonable to continue diuresis as outpatient with oral diruetics. For her afib she is rate controlled with  diltiazem, this was changed from beter blocker this admit due to active wheezing and dilt was decreased to  bid due to some bradycardia. Some low heart rates on tele in very early AM hours presumably while sleeping, otherwise rates look good.  She remains on coumadin for stroke prevention. BP's remain above goal, we will increase hydralazine to 37.5mg  tid. From cardiac standpoint ok with discharge today, she will need to f/u early next week in our clinic and have a BMET and Mg as well. Would consider repeat echo once she is euvolemic to reassess her pulmonary pressures. Will call our clinic to arrange f/u on Monday or Tuesday.  We will sign off, please call with questions   Dominga Ferry MD

## 2015-02-25 NOTE — Progress Notes (Signed)
Discharge instructions given on medications,and follow up visits,patient,and family verbalized understanding. Prescriptions sent to Pharmacy of choice documented on AVS. No c/o pain or discomfort noted. Accompanied by staff to an awaiting vehicle.

## 2015-02-25 NOTE — Progress Notes (Signed)
ANTICOAGULATION CONSULT NOTE   Pharmacy Consult for Coumadin Indication: atrial fibrillation  No Known Allergies  Patient Measurements: Height:  (167.6 cm) Weight: 160 lb 12.8 oz (72.938 kg) IBW/kg (Calculated) : 59.3  Vital Signs: Temp: 98.2 F (36.8 C) (01/18 0336) Temp Source: Oral (01/18 0336) BP: 193/64 mmHg (01/18 0336) Pulse Rate: 66 (01/18 0336)  Labs:  Recent Labs  02/23/15 0605 02/23/15 0610 02/24/15 0613 02/24/15 0704 02/25/15 0603  LABPROT 19.9*  --  22.0*  --  22.1*  INR 1.69*  --  1.93*  --  1.95*  CREATININE  --  1.37*  --  1.47* 1.32*    Estimated Creatinine Clearance: 33.6 mL/min (by C-G formula based on Cr of 1.32).  Medical History: Past Medical History  Diagnosis Date  . Essential hypertension, benign   . Atrial fibrillation (HCC)   . Type 2 diabetes mellitus (HCC)   . Mixed hyperlipidemia   Medications:   Prescriptions prior to admission  Medication Sig Dispense Refill Last Dose  . amLODipine (NORVASC) 10 MG tablet TAKE (1) TABLET BY MOUTH DAILY. 30 tablet 6 02/17/2015 at Unknown time  . atorvastatin (LIPITOR) 20 MG tablet Take 20 mg by mouth daily.   02/17/2015 at Unknown time  . carvedilol (COREG) 25 MG tablet Take 25 mg by mouth 2 (two) times daily with a meal.   02/17/2015 at Unknown time  . cephALEXin (KEFLEX) 500 MG capsule Take 500 mg by mouth 3 (three) times daily. 10 day course starting on 02/10/2015   02/17/2015 at Unknown time  . furosemide (LASIX) 40 MG tablet Take 40 mg by mouth daily.    02/17/2015 at Unknown time  . glimepiride (AMARYL) 2 MG tablet Take 2 mg by mouth daily.     02/17/2015 at Unknown time  . HYDROcodone-acetaminophen (NORCO/VICODIN) 5-325 MG tablet Take 1 tablet by mouth 4 (four) times daily.    02/17/2015 at Unknown time  . lisinopril (PRINIVIL,ZESTRIL) 40 MG tablet Take 40 mg by mouth 2 (two) times daily.     02/17/2015 at Unknown time  . metFORMIN (GLUCOPHAGE) 500 MG tablet Take 500 mg by mouth 2 (two) times daily  with meals.     02/17/2015 at Unknown time  . solifenacin (VESICARE) 5 MG tablet Take 5 mg by mouth daily.   02/17/2015 at Unknown time  . TRAVATAN Z 0.004 % SOLN ophthalmic solution Place 1 drop into both eyes at bedtime.    02/16/2015 at Unknown time  . warfarin (COUMADIN) 5 MG tablet Take 1 tablet daily or as directed (Patient taking differently: Take 5 mg by mouth daily. ) 45 tablet 3 02/16/2015 at 1800   Assessment: 82 yo female on chronic anticoagulation for afib. Pt admitted for new onset of CHF. Plan to continue with coumadin. INR had decreased due to dose missed on 1/13 but trending up now.  No bleeding reported  Goal of Therapy:  INR 2-3 Monitor platelets by anticoagulation protocol: Yes   Plan:  Coumadin  po today Monitor for S/S of bleeding PT/INR daily  Thanks for allowing pharmacy to be a part of this patient's care.  Talbert Cage, PharmD Clinical Pharmacist 02/25/2015,8:40 AM

## 2015-02-25 NOTE — Discharge Summary (Signed)
Physician Discharge Summary  Patient ID: Melissa [name] MRN: [id number] DOB/AGE: May 12, 1930 83 y.o. Primary Care Physician:Dalton Molesworth L, MD Admit date: 02/17/2015 Discharge date: 02/25/2015    Discharge Diagnoses:   Principal Problem:   CHF (congestive heart failure) (HCC) Active Problems:   Mixed hyperlipidemia   Essential hypertension, benign   Atrial fibrillation (HCC)   Long term current use of anticoagulant   Tricuspid valve regurgitation   Chronic venous insufficiency  acute on chronic diastolic heart failure    Medication List    STOP taking these medications        amLODipine 10 MG tablet  Commonly known as:  NORVASC     carvedilol 25 MG tablet  Commonly known as:  COREG     cephALEXin 500 MG capsule  Commonly known as:  KEFLEX     furosemide 40 MG tablet  Commonly known as:  LASIX      TAKE these medications        atorvastatin 20 MG tablet  Commonly known as:  LIPITOR  Take 20 mg by mouth daily.     diltiazem 120 MG 24 hr capsule  Commonly known as:  CARDIZEM CD  Take 1 capsule (120 mg total) by mouth daily.     glimepiride 2 MG tablet  Commonly known as:  AMARYL  Take 2 mg by mouth daily.     hydrALAZINE 50 MG tablet  Commonly known as:  APRESOLINE  Take 1 tablet (50 mg total) by mouth every 8 (eight) hours.     HYDROcodone-acetaminophen 5-325 MG tablet  Commonly known as:  NORCO/VICODIN  Take 1 tablet by mouth 4 (four) times daily.     lisinopril 40 MG tablet  Commonly known as:  PRINIVIL,ZESTRIL  Take 40 mg by mouth 2 (two) times daily.     metFORMIN 500 MG tablet  Commonly known as:  GLUCOPHAGE  Take 500 mg by mouth 2 (two) times daily with meals.     potassium chloride SA 20 MEQ tablet  Commonly known as:  K-DUR,KLOR-CON  Take 2 tablets (40 mEq total) by mouth 2 (two) times daily.     solifenacin 5 MG tablet  Commonly known as:  VESICARE  Take 5 mg by mouth daily.     torsemide 20 MG tablet  Commonly known as:   DEMADEX  Take 1 tablet (20 mg total) by mouth 2 (two) times daily.     TRAVATAN Z 0.004 % Soln ophthalmic solution  Generic drug:  Travoprost (BAK Free)  Place 1 drop into both eyes at bedtime.     warfarin 5 MG tablet  Commonly known as:  COUMADIN  Take 1 tablet daily or as directed        Discharged Condition: Improved    Consults: Cardiology  Significant Diagnostic Studies: Dg Chest 1 View  02/22/2015  CLINICAL DATA:  CHF, type II diabetes mellitus, essential benign hypertension, atrial fibrillation, mixed hyperlipidemia EXAM: CHEST 1 VIEW COMPARISON:  Portable exam 1134 hours compared to 02/20/2015 FINDINGS: Enlargement of cardiac silhouette with pulmonary vascular congestion. Atherosclerotic calcification aorta. Minimal pulmonary edema. Persistent atelectasis versus consolidation LEFT lower lobe. No gross pleural effusion or pneumothorax. Bones unremarkable. IMPRESSION: Enlargement of cardiac silhouette with pulmonary vascular congestion and minimal pulmonary edema/CHF. Persistent LEFT lower lobe atelectasis versus consolidation, little changed. Electronically Signed   By: Ulyses Southward M.D.   On: 02/22/2015 13:47   Dg Chest 2 View  02/20/2015  CLINICAL DATA:  Cough and shortness of breath EXAM:  CHEST  2 VIEW COMPARISON:  02/17/2015 FINDINGS: There is chronic cardiopericardial enlargement with aortic tortuosity. Increased density at the left base compatible with pneumonia or atelectasis. No edema on today's scan. No pleural effusion or pneumothorax. IMPRESSION: 1. New left lower lobe pneumonia or atelectasis. 2. Cardiomegaly without failure. Electronically Signed   By: Marnee Spring M.D.   On: 02/20/2015 15:44   Dg Chest 2 View  02/17/2015  CLINICAL DATA:  Shortness of breath, cough. EXAM: CHEST  2 VIEW COMPARISON:  September 12, 2008. FINDINGS: Stable cardiomegaly is noted. Increased central pulmonary vascular congestion is noted with mild bilateral pulmonary edema. No pneumothorax or  pleural effusion is noted. Fluid appears to be in the inferior portion of both major fissures. No pneumothorax is noted. Bony thorax is intact. IMPRESSION: Stable cardiomegaly is noted. Central pulmonary vascular congestion with mild bilateral pulmonary edema is noted. Electronically Signed   By: Lupita Raider, M.D.   On: 02/17/2015 18:30    Lab Results: Basic Metabolic Panel:  Recent Labs  16/10/96 0704 02/25/15 0603  NA 143 141  K 4.4 4.7  CL 102 100*  CO2 34* 32  GLUCOSE 123* 126*  BUN 29* 28*  CREATININE 1.47* 1.32*  CALCIUM 8.7* 9.1   Liver Function Tests: No results for input(s): AST, ALT, ALKPHOS, BILITOT, PROT, ALBUMIN in the last 72 hours.   CBC: No results for input(s): WBC, NEUTROABS, HGB, HCT, MCV, PLT in the last 72 hours.  No results found for this or any previous visit (from the past 240 hour(s)).   Hospital Course: This is an 82 year old came to the emergency department because of shortness of breath. She was found to have heart failure with significant swelling of her legs. Her weight was up significantly. She had been taking Lasix at home but admits that she had not been eating properly during the holiday season and is eating a lot of salty foods. She was treated with intravenous diuresis. She was switched from carvedilol to diltiazem for heart rate control from her chronic atrial fibrillation. Her Lasix was discontinued and she was started on torsemide. By the time of discharge she was almost at baseline. She had adjustments made in her blood pressure medications as well.  Discharge Exam: Blood pressure 193/64, pulse 66, temperature 98.2 F (36.8 C), temperature source Oral, resp. rate 20, height  (1.676 m), weight 72.938 kg (160 lb 12.8 oz), SpO2 92 %. She is awake and alert. Her edema is much less. She still has atrial fibrillation.  Disposition: Home with home health services. She will have basic metabolic profile on 01/27/2016. She will have early  follow-up with cardiology      Discharge Instructions    Face-to-face encounter (required for Medicare/Medicaid patients)    Complete by:  As directed   I Aaliyha Mumford L certify that this patient is under my care and that I, or a nurse practitioner or physician's assistant working with me, had a face-to-face encounter that meets the physician face-to-face encounter requirements with this patient on 02/25/2015. The encounter with the patient was in whole, or in part for the following medical condition(s) which is the primary reason for home health care (List medical condition): chf  The encounter with the patient was in whole, or in part, for the following medical condition, which is the primary reason for home health care:  chf  I certify that, based on my findings, the following services are medically necessary home health services:  Nursing  Reason  for Medically Necessary Home Health Services:  Skilled Nursing- Change/Decline in Patient Status  My clinical findings support the need for the above services:  Shortness of breath with activity  Further, I certify that my clinical findings support that this patient is homebound due to:  Shortness of Breath with activity     Home Health    Complete by:  As directed   To provide the following care/treatments:  RN  Please check BMET on 02/27/2015             Signed: Ludella Pranger L   02/25/2015, 9:32 AM

## 2015-02-25 NOTE — Progress Notes (Signed)
She is doing better. Cardiology input noted and appreciated. I will plan to discharge her home today. Please see discharge summary for details

## 2015-02-25 NOTE — Care Management Important Message (Signed)
Important Message  Patient Details  Name: Melissa [name] MRN: [id number] Date of Birth: [birth year]   Medicare Important Message Given:  Yes    Adonis Huguenin, RN 02/25/2015, 9:38 AM

## 2015-03-02 ENCOUNTER — Encounter: Payer: Self-pay | Admitting: Adult Health

## 2015-03-02 ENCOUNTER — Ambulatory Visit (INDEPENDENT_AMBULATORY_CARE_PROVIDER_SITE_OTHER): Payer: Medicare HMO | Admitting: Adult Health

## 2015-03-02 ENCOUNTER — Other Ambulatory Visit: Payer: Self-pay

## 2015-03-02 ENCOUNTER — Ambulatory Visit (HOSPITAL_COMMUNITY)
Admission: RE | Admit: 2015-03-02 | Discharge: 2015-03-02 | Disposition: A | Discharge status: Home / Self Care | Payer: Medicare HMO | Source: Ambulatory Visit | Attending: Adult Health | Admitting: Adult Health

## 2015-03-02 ENCOUNTER — Telehealth: Payer: Self-pay | Admitting: *Deleted

## 2015-03-02 VITALS — BP 182/78 | HR 64 | Ht 66.0 in | Wt 156.0 lb

## 2015-03-02 DIAGNOSIS — R05 Cough: Secondary | ICD-10-CM | POA: Insufficient documentation

## 2015-03-02 DIAGNOSIS — R0989 Other specified symptoms and signs involving the circulatory and respiratory systems: Secondary | ICD-10-CM | POA: Diagnosis not present

## 2015-03-02 DIAGNOSIS — I481 Persistent atrial fibrillation: Secondary | ICD-10-CM

## 2015-03-02 DIAGNOSIS — I4819 Other persistent atrial fibrillation: Secondary | ICD-10-CM

## 2015-03-02 DIAGNOSIS — I517 Cardiomegaly: Secondary | ICD-10-CM | POA: Diagnosis not present

## 2015-03-02 DIAGNOSIS — R059 Cough, unspecified: Secondary | ICD-10-CM

## 2015-03-02 DIAGNOSIS — I878 Other specified disorders of veins: Secondary | ICD-10-CM | POA: Insufficient documentation

## 2015-03-02 DIAGNOSIS — Z8679 Personal history of other diseases of the circulatory system: Secondary | ICD-10-CM | POA: Diagnosis not present

## 2015-03-02 DIAGNOSIS — I482 Chronic atrial fibrillation, unspecified: Secondary | ICD-10-CM

## 2015-03-02 DIAGNOSIS — I509 Heart failure, unspecified: Secondary | ICD-10-CM | POA: Diagnosis not present

## 2015-03-02 DIAGNOSIS — I700 Atherosclerosis of aorta: Secondary | ICD-10-CM | POA: Diagnosis not present

## 2015-03-02 LAB — POCT INR: INR: 2.5

## 2015-03-02 NOTE — Patient Instructions (Signed)
Medication Instructions:  Your physician recommends that you continue on your current medications as directed. Please refer to the Current Medication list given to you today.  START ROBITUSSIN (COUGH/CONGESTION) OTC - TAKE EVERY 6 HOURS AS NEEDED FOR COUGH/CONGESTION   Labwork: NONE  Testing/Procedures: A chest x-ray takes a picture of the organs and structures inside the chest, including the heart, lungs, and blood vessels. This test can show several things, including, whether the heart is enlarges; whether fluid is building up in the lungs; and whether pacemaker / defibrillator leads are still in place.   Follow-Up: Your physician recommends that you schedule a follow-up appointment in: 1 MONTH WITH KATHRYN LAWRENCE , N.P.    Any Other Special Instructions Will Be Listed Below (If Applicable).     If you need a refill on your cardiac medications before your next appointment, please call your pharmacy.

## 2015-03-02 NOTE — Progress Notes (Signed)
Cardiology Office Note   Date:  03/02/2015   ID:  Melissa [name], DOB Jun 22, 1930, MRN [id number]  PCP:  Fredirick Maudlin, MD  Cardiologist: [place]/  Joni Reining, NP   Chief Complaint  Patient presents with  . Congestive Heart Failure  . Hypertension  . Atrial Fibrillation      History of Present Illness: Melissa [name] is a 81 y.o. female who presents for ongoing assessment and management of of atrial fibrillation, CHADS VASC Score of 5, on coumadin,, with chronic dependent LEE, hx of diabetes, and hypertension. She was recently admitted for new onset CHF and hypertension.She was switched from carvedilol to diltiazem for heart rate control from her chronic atrial fibrillation. Her Lasix was discontinued and she was started on torsemide. By the time of discharge she was almost at baseline.Wt 160 lbs.   She has been having worsening coughing and congestion since leaving the hospital She continues to be compliant with torsemide, but not always with low sodium diet. She states she feels a lot better but has noticed worsening LEE today. She is not wearing the TED hose, due to this office visit.   Past Medical History  Diagnosis Date  . Essential hypertension, benign   . Atrial fibrillation (HCC)   . Type 2 diabetes mellitus (HCC)   . Mixed hyperlipidemia     Past Surgical History  Procedure Laterality Date  . Total hip arthroplasty  01/14/03    Left     Current Outpatient Prescriptions  Medication Sig Dispense Refill  . atorvastatin (LIPITOR) 20 MG tablet Take 20 mg by mouth daily.    Marland Kitchen diltiazem (CARDIZEM CD) 120 MG 24 hr capsule Take 1 capsule (120 mg total) by mouth daily. 30 capsule 12  . glimepiride (AMARYL) 2 MG tablet Take 2 mg by mouth daily.      . hydrALAZINE (APRESOLINE) 50 MG tablet Take 1 tablet (50 mg total) by mouth every 8 (eight) hours. 90 tablet 12  . HYDROcodone-acetaminophen (NORCO/VICODIN) 5-325 MG tablet Take 1 tablet by mouth 4 (four) times  daily.     Marland Kitchen lisinopril (PRINIVIL,ZESTRIL) 40 MG tablet Take 40 mg by mouth 2 (two) times daily.      . metFORMIN (GLUCOPHAGE) 500 MG tablet Take 500 mg by mouth 2 (two) times daily with meals.      . potassium chloride SA (K-DUR,KLOR-CON) 20 MEQ tablet Take 2 tablets (40 mEq total) by mouth 2 (two) times daily. 120 tablet 5  . solifenacin (VESICARE) 5 MG tablet Take 5 mg by mouth daily.    Marland Kitchen torsemide (DEMADEX) 20 MG tablet Take 1 tablet (20 mg total) by mouth 2 (two) times daily. 60 tablet 12  . TRAVATAN Z 0.004 % SOLN ophthalmic solution Place 1 drop into both eyes at bedtime.     Marland Kitchen warfarin (COUMADIN) 5 MG tablet Take 1 tablet daily or as directed (Patient taking differently: Take 5 mg by mouth daily. ) 45 tablet 3   No current facility-administered medications for this visit.    Allergies:   Review of patient's allergies indicates no known allergies.    Social History:  The patient  reports that she has never smoked. She has never used smokeless tobacco. She reports that she does not drink alcohol or use illicit drugs.   Family History:  The patient's family history includes Diabetes type II in her father and mother; Hypertension in her father.    ROS: All other systems are reviewed and negative. Unless otherwise mentioned  in H&P    PHYSICAL EXAM: VS:  BP 182/78 mmHg  Pulse 64  Ht  (1.676 m)  Wt 156 lb (70.761 kg)  BMI 25.19 kg/m2  SpO2 94% , BMI Body mass index is 25.19 kg/(m^2). GEN: Well nourished, well developed, in no acute distress HEENT: normal Neck: Positive JVD, carotid bruits, or masses Cardiac: IRRR; no murmurs, rubs, or gallops, 2+ pitting edema bilateral LE.   Respiratory:  Bilateral crackles in the bases with inspiratory wheezes, and frequent coughing. GI: soft, nontender, nondistended, + BS MS: no deformity or atrophy Skin: warm and dry, no rash Neuro:  Strength and sensation are intact Psych: euthymic mood, full affect   Recent Labs: 02/17/2015: B  Natriuretic Peptide 982.0*; Hemoglobin 11.3*; Platelets 235; TSH 3.793 02/25/2015: BUN 28*; Creatinine, Ser 1.32*; Potassium 4.7; Sodium 141    Lipid Panel    Component Value Date/Time   CHOL 188 05/10/2011 0930   TRIG 67 05/10/2011 0930   HDL 64 05/10/2011 0930   CHOLHDL 2.9 05/10/2011 0930   VLDL 13 05/10/2011 0930   LDLCALC 111* 05/10/2011 0930      Wt Readings from Last 3 Encounters:  03/02/15 156 lb (70.761 kg)  02/25/15 160 lb 12.8 oz (72.938 kg)  11/12/14 142 lb (64.411 kg)     ASSESSMENT AND PLAN:  1. Hx of CHF and Pulmonary Edema: She continues coughing and congestion with rales and wheezing. Will have follow up CXR to evaluate for ongoing CHF. Until results are provided, will continue torsemide 20 mg BID. She has had labs drawn earlier. Will review when available. Wt is stable. She is to continue daily wts and salt restriction.   2. Atrial fibrillation: Heart rate is controlled. She is seen in our coumadin clinic today as well. Diltiazem is likely part of etiology of some of the LEE. Recommend continued TED hose at home.   3. Pulmonary Hypertension:  She continues CCB and chronic DOE.   4. Hypertension: Well controlled. Continue hydralazine and lisinopril   Current medicines are reviewed at length with the patient today.    Labs/ tests ordered today include: CXR  Orders Placed This Encounter  Procedures  . DG Chest 2 View     Disposition:   FU with one month.   Signed, Joni Reining, NP  03/02/2015 4:36 PM    [facility] [address], [place], [place] [address] Phone: [phone]; Fax: [phone]

## 2015-03-02 NOTE — Progress Notes (Deleted)
Name: Melissa [name]    DOB: 11-13-1930  Age: 82 y.o.  MR#: [id number]       PCP:  Fredirick Maudlin, MD      Insurance: Payor: A[employer] MEDICARE / Plan: A[employer] MEDICARE HMO/PPO / Product Type: *No Product type* /   CC:   No chief complaint on file.   VS Filed Vitals:   03/02/15 1531  BP: 182/78  Pulse: 64  Height:  (1.676 m)  Weight: 156 lb (70.761 kg)  SpO2: 94%    Weights Current Weight  03/02/15 156 lb (70.761 kg)  02/25/15 160 lb 12.8 oz (72.938 kg)  11/12/14 142 lb (64.411 kg)    Blood Pressure  BP Readings from Last 3 Encounters:  03/02/15 182/78  02/25/15 193/64  11/12/14 138/80     Admit date:  (Not on file) Last encounter with RMR:  Visit date not found   Allergy Review of patient's allergies indicates no known allergies.  Current Outpatient Prescriptions  Medication Sig Dispense Refill  . atorvastatin (LIPITOR) 20 MG tablet Take 20 mg by mouth daily.    Marland Kitchen diltiazem (CARDIZEM CD) 120 MG 24 hr capsule Take 1 capsule (120 mg total) by mouth daily. 30 capsule 12  . glimepiride (AMARYL) 2 MG tablet Take 2 mg by mouth daily.      . hydrALAZINE (APRESOLINE) 50 MG tablet Take 1 tablet (50 mg total) by mouth every 8 (eight) hours. 90 tablet 12  . HYDROcodone-acetaminophen (NORCO/VICODIN) 5-325 MG tablet Take 1 tablet by mouth 4 (four) times daily.     Marland Kitchen lisinopril (PRINIVIL,ZESTRIL) 40 MG tablet Take 40 mg by mouth 2 (two) times daily.      . metFORMIN (GLUCOPHAGE) 500 MG tablet Take 500 mg by mouth 2 (two) times daily with meals.      . potassium chloride SA (K-DUR,KLOR-CON) 20 MEQ tablet Take 2 tablets (40 mEq total) by mouth 2 (two) times daily. 120 tablet 5  . solifenacin (VESICARE) 5 MG tablet Take 5 mg by mouth daily.    Marland Kitchen torsemide (DEMADEX) 20 MG tablet Take 1 tablet (20 mg total) by mouth 2 (two) times daily. 60 tablet 12  . TRAVATAN Z 0.004 % SOLN ophthalmic solution Place 1 drop into both eyes at bedtime.     Marland Kitchen warfarin (COUMADIN) 5 MG tablet Take 1  tablet daily or as directed (Patient taking differently: Take 5 mg by mouth daily. ) 45 tablet 3   No current facility-administered medications for this visit.    Discontinued Meds:   There are no discontinued medications.  Patient Active Problem List   Diagnosis Date Noted  . Chronic venous insufficiency 02/19/2015  . CHF (congestive heart failure) (HCC) 02/17/2015  . Tricuspid valve regurgitation 02/17/2015  . Encounter for therapeutic drug monitoring 03/11/2013  . Long term current use of anticoagulant 05/05/2010  . Mixed hyperlipidemia 09/30/2009  . Essential hypertension, benign 09/24/2008  . Atrial fibrillation (HCC) 09/24/2008    LABS    Component Value Date/Time   NA 141 02/25/2015 0603   NA 143 02/24/2015 0704   NA 142 02/23/2015 0610   K 4.7 02/25/2015 0603   K 4.4 02/24/2015 0704   K 4.2 02/23/2015 0610   CL 100* 02/25/2015 0603   CL 102 02/24/2015 0704   CL 102 02/23/2015 0610   CO2 32 02/25/2015 0603   CO2 34* 02/24/2015 0704   CO2 33* 02/23/2015 0610   GLUCOSE 126* 02/25/2015 0603   GLUCOSE 123* 02/24/2015 0704  GLUCOSE 98 02/23/2015 0610   BUN 28* 02/25/2015 0603   BUN 29* 02/24/2015 0704   BUN 27* 02/23/2015 0610   CREATININE 1.32* 02/25/2015 0603   CREATININE 1.47* 02/24/2015 0704   CREATININE 1.37* 02/23/2015 0610   CALCIUM 9.1 02/25/2015 0603   CALCIUM 8.7* 02/24/2015 0704   CALCIUM 9.0 02/23/2015 0610   GFRNONAA 36* 02/25/2015 0603   GFRNONAA 32* 02/24/2015 0704   GFRNONAA 35* 02/23/2015 0610   GFRAA 42* 02/25/2015 0603   GFRAA 37* 02/24/2015 0704   GFRAA 40* 02/23/2015 0610   CMP     Component Value Date/Time   NA 141 02/25/2015 0603   K 4.7 02/25/2015 0603   CL 100* 02/25/2015 0603   CO2 32 02/25/2015 0603   GLUCOSE 126* 02/25/2015 0603   BUN 28* 02/25/2015 0603   CREATININE 1.32* 02/25/2015 0603   CALCIUM 9.1 02/25/2015 0603   PROT 7.5 05/10/2011 0930   ALBUMIN 4.3 05/10/2011 0930   AST 22 05/10/2011 0930   ALT 10 05/10/2011  0930   ALKPHOS 111 05/10/2011 0930   BILITOT 0.8 05/10/2011 0930   GFRNONAA 36* 02/25/2015 0603   GFRAA 42* 02/25/2015 0603       Component Value Date/Time   WBC 8.6 02/17/2015 1759   WBC 6.8 09/12/2008 0854   HGB 11.3* 02/17/2015 1759   HGB 12.4 07/13/2010 1244   HGB 13.9 09/12/2008 0854   HCT 34.5* 02/17/2015 1759   HCT 37.6 07/13/2010 1244   HCT 41.0 09/12/2008 0854   MCV 97.5 02/17/2015 1759   MCV 95.0 09/12/2008 0854    Lipid Panel     Component Value Date/Time   CHOL 188 05/10/2011 0930   TRIG 67 05/10/2011 0930   HDL 64 05/10/2011 0930   CHOLHDL 2.9 05/10/2011 0930   VLDL 13 05/10/2011 0930   LDLCALC 111* 05/10/2011 0930    ABG    Component Value Date/Time   PHART 7.422* 09/29/2008 1515   PCO2ART 36.9 09/29/2008 1515   PO2ART 85.8 09/29/2008 1515   HCO3 23.6 09/29/2008 1515   TCO2 20.9 09/29/2008 1515   ACIDBASEDEF 0.3 09/29/2008 1515   O2SAT 96.9 09/29/2008 1515     Lab Results  Component Value Date   TSH 3.793 02/17/2015   BNP (last 3 results)  Recent Labs  02/17/15 1759  BNP 982.0*    ProBNP (last 3 results) No results for input(s): PROBNP in the last 8760 hours.  Cardiac Panel (last 3 results) No results for input(s): CKTOTAL, CKMB, TROPONINI, RELINDX in the last 72 hours.  Iron/TIBC/Ferritin/ %Sat No results found for: IRON, TIBC, FERRITIN, IRONPCTSAT   EKG Orders placed or performed during the hospital encounter of 02/17/15  . EKG 12-Lead  . EKG 12-Lead  . EKG 12-Lead  . EKG 12-Lead  . EKG 12-Lead  . EKG 12-Lead  . EKG     Prior Assessment and Plan Problem List as of 03/02/2015      Cardiovascular and Mediastinum   Essential hypertension, benign   Last Assessment & Plan 11/11/2013 Office Visit Written 11/11/2013  2:24 PM by Jonelle Sidle, MD    No changes made in current regimen. Keep followup with Dr. Juanetta Gosling.      Atrial fibrillation [facility])   Last Assessment & Plan 11/11/2013 Office Visit Written 11/11/2013  2:24 PM by  Jonelle Sidle, MD    Chronic, continue strategy of heart rate control and anticoagulation.      CHF (congestive heart failure) (HCC)   Tricuspid valve  regurgitation   Chronic venous insufficiency     Other   Mixed hyperlipidemia   Last Assessment & Plan 05/03/2013 Office Visit Written 05/03/2013  1:33 PM by Jonelle Sidle, MD    She continues on Lipitor. Keep followup with Dr. Juanetta Gosling.      Long term current use of anticoagulant   Encounter for therapeutic drug monitoring       Imaging: Dg Chest 1 View  02/22/2015  CLINICAL DATA:  CHF, type II diabetes mellitus, essential benign hypertension, atrial fibrillation, mixed hyperlipidemia EXAM: CHEST 1 VIEW COMPARISON:  Portable exam 1134 hours compared to 02/20/2015 FINDINGS: Enlargement of cardiac silhouette with pulmonary vascular congestion. Atherosclerotic calcification aorta. Minimal pulmonary edema. Persistent atelectasis versus consolidation LEFT lower lobe. No gross pleural effusion or pneumothorax. Bones unremarkable. IMPRESSION: Enlargement of cardiac silhouette with pulmonary vascular congestion and minimal pulmonary edema/CHF. Persistent LEFT lower lobe atelectasis versus consolidation, little changed. Electronically Signed   By: Ulyses Southward M.D.   On: 02/22/2015 13:47   Dg Chest 2 View  02/20/2015  CLINICAL DATA:  Cough and shortness of breath EXAM: CHEST  2 VIEW COMPARISON:  02/17/2015 FINDINGS: There is chronic cardiopericardial enlargement with aortic tortuosity. Increased density at the left base compatible with pneumonia or atelectasis. No edema on today's scan. No pleural effusion or pneumothorax. IMPRESSION: 1. New left lower lobe pneumonia or atelectasis. 2. Cardiomegaly without failure. Electronically Signed   By: Marnee Spring M.D.   On: 02/20/2015 15:44   Dg Chest 2 View  02/17/2015  CLINICAL DATA:  Shortness of breath, cough. EXAM: CHEST  2 VIEW COMPARISON:  September 12, 2008. FINDINGS: Stable cardiomegaly is  noted. Increased central pulmonary vascular congestion is noted with mild bilateral pulmonary edema. No pneumothorax or pleural effusion is noted. Fluid appears to be in the inferior portion of both major fissures. No pneumothorax is noted. Bony thorax is intact. IMPRESSION: Stable cardiomegaly is noted. Central pulmonary vascular congestion with mild bilateral pulmonary edema is noted. Electronically Signed   By: Lupita Raider, M.D.   On: 02/17/2015 18:30

## 2015-03-02 NOTE — Telephone Encounter (Signed)
INR 2.5 --pt takes  of coumadin a day

## 2015-03-03 ENCOUNTER — Telehealth: Payer: Self-pay | Admitting: *Deleted

## 2015-03-03 ENCOUNTER — Ambulatory Visit (INDEPENDENT_AMBULATORY_CARE_PROVIDER_SITE_OTHER): Payer: Medicare HMO | Admitting: *Deleted

## 2015-03-03 DIAGNOSIS — Z5181 Encounter for therapeutic drug level monitoring: Secondary | ICD-10-CM

## 2015-03-03 DIAGNOSIS — I4891 Unspecified atrial fibrillation: Secondary | ICD-10-CM

## 2015-03-03 LAB — BASIC METABOLIC PANEL
BUN: 26 mg/dL — AB (ref 7–25)
CALCIUM: 9 mg/dL (ref 8.6–10.4)
CHLORIDE: 105 mmol/L (ref 98–110)
CO2: 27 mmol/L (ref 20–31)
CREATININE: 1.7 mg/dL — AB (ref 0.60–0.88)
Glucose, Bld: 81 mg/dL (ref 65–99)
Potassium: 4.5 mmol/L (ref 3.5–5.3)
Sodium: 135 mmol/L (ref 135–146)

## 2015-03-03 LAB — MAGNESIUM: MAGNESIUM: 1.8 mg/dL (ref 1.5–2.5)

## 2015-03-03 NOTE — Telephone Encounter (Signed)
Called patient with test results. No answer.unable to reach pt

## 2015-03-03 NOTE — Telephone Encounter (Signed)
-----   Message from Kathryn M Lawrence, NP sent at 03/02/2015  4:52 PM EST ----- Please have her take an additional torsemide 20 mg tablet tomorrow. Can be a TID dosing for ONE day and then back to BID due to continued evidence of pulmonary venous congestion. Have her take one extra potassium with the extra torsemide. 

## 2015-03-09 ENCOUNTER — Encounter: Payer: Self-pay | Admitting: *Deleted

## 2015-03-09 ENCOUNTER — Telehealth: Payer: Self-pay | Admitting: *Deleted

## 2015-03-09 NOTE — Telephone Encounter (Signed)
-----   Message from Jodelle Gross, NP sent at 03/02/2015  4:52 PM EST ----- Please have her take an additional torsemide 20 mg tablet tomorrow. Can be a TID dosing for ONE day and then back to BID due to continued evidence of pulmonary venous congestion. Have her take one extra potassium with the extra torsemide.

## 2015-03-09 NOTE — Telephone Encounter (Signed)
Letter sent.

## 2015-03-10 ENCOUNTER — Ambulatory Visit (INDEPENDENT_AMBULATORY_CARE_PROVIDER_SITE_OTHER): Payer: Medicare HMO | Admitting: *Deleted

## 2015-03-10 DIAGNOSIS — I4891 Unspecified atrial fibrillation: Secondary | ICD-10-CM

## 2015-03-10 DIAGNOSIS — Z5181 Encounter for therapeutic drug level monitoring: Secondary | ICD-10-CM

## 2015-03-10 LAB — POCT INR: INR: 2.9

## 2015-03-12 ENCOUNTER — Encounter: Payer: Medicare HMO | Admitting: Adult Health

## 2015-03-13 ENCOUNTER — Ambulatory Visit (INDEPENDENT_AMBULATORY_CARE_PROVIDER_SITE_OTHER): Payer: Medicare HMO | Admitting: Cardiology

## 2015-03-13 DIAGNOSIS — Z5181 Encounter for therapeutic drug level monitoring: Secondary | ICD-10-CM

## 2015-03-13 DIAGNOSIS — I4891 Unspecified atrial fibrillation: Secondary | ICD-10-CM

## 2015-03-13 LAB — POCT INR: INR: 2.6

## 2015-03-26 ENCOUNTER — Ambulatory Visit (INDEPENDENT_AMBULATORY_CARE_PROVIDER_SITE_OTHER): Payer: Medicare HMO | Admitting: *Deleted

## 2015-03-26 DIAGNOSIS — I4891 Unspecified atrial fibrillation: Secondary | ICD-10-CM

## 2015-03-26 DIAGNOSIS — Z5181 Encounter for therapeutic drug level monitoring: Secondary | ICD-10-CM

## 2015-03-26 LAB — POCT INR: INR: 2.3

## 2015-04-02 ENCOUNTER — Encounter: Payer: Self-pay | Admitting: Adult Health

## 2015-04-02 ENCOUNTER — Ambulatory Visit (INDEPENDENT_AMBULATORY_CARE_PROVIDER_SITE_OTHER): Payer: Medicare HMO | Admitting: Adult Health

## 2015-04-02 VITALS — BP 128/58 | HR 66 | Ht 64.0 in | Wt 142.0 lb

## 2015-04-02 DIAGNOSIS — I482 Chronic atrial fibrillation, unspecified: Secondary | ICD-10-CM

## 2015-04-02 DIAGNOSIS — I10 Essential (primary) hypertension: Secondary | ICD-10-CM | POA: Diagnosis not present

## 2015-04-02 DIAGNOSIS — I5040 Unspecified combined systolic (congestive) and diastolic (congestive) heart failure: Secondary | ICD-10-CM | POA: Diagnosis not present

## 2015-04-02 NOTE — Progress Notes (Signed)
Name: Melissa [name]    DOB: 1930-12-09  Age: 82 y.o.  MR#: [id number]       PCP:  Fredirick Maudlin, MD      Insurance: Payor: AET[employer] MEDICARE / Plan: AETNA MEDICARE HMO/PPO / Product Type: *No Product type* /   CC:   No chief complaint on file.   VS Filed Vitals:   04/02/15 1341  BP: 128/58  Pulse: 66  Height:  (1.626 m)  Weight: 142 lb (64.411 kg)  SpO2: 99%    Weights Current Weight  04/02/15 142 lb (64.411 kg)  03/02/15 156 lb (70.761 kg)  02/25/15 160 lb 12.8 oz (72.938 kg)    Blood Pressure  BP Readings from Last 3 Encounters:  04/02/15 128/58  03/02/15 182/78  02/25/15 193/64     Admit date:  (Not on file) Last encounter with RMR:  03/02/2015   Allergy Review of patient's allergies indicates no known allergies.  Current Outpatient Prescriptions  Medication Sig Dispense Refill  . atorvastatin (LIPITOR) 20 MG tablet Take 20 mg by mouth daily.    Marland Kitchen diltiazem (CARDIZEM CD) 120 MG 24 hr capsule Take 1 capsule (120 mg total) by mouth daily. 30 capsule 12  . glimepiride (AMARYL) 2 MG tablet Take 2 mg by mouth daily.      . hydrALAZINE (APRESOLINE) 50 MG tablet Take 1 tablet (50 mg total) by mouth every 8 (eight) hours. 90 tablet 12  . HYDROcodone-acetaminophen (NORCO/VICODIN) 5-325 MG tablet Take 1 tablet by mouth 4 (four) times daily.     Marland Kitchen lisinopril (PRINIVIL,ZESTRIL) 40 MG tablet Take 40 mg by mouth 2 (two) times daily.      . metFORMIN (GLUCOPHAGE) 500 MG tablet Take 500 mg by mouth 2 (two) times daily with meals.      . potassium chloride SA (K-DUR,KLOR-CON) 20 MEQ tablet Take 2 tablets (40 mEq total) by mouth 2 (two) times daily. 120 tablet 5  . solifenacin (VESICARE) 5 MG tablet Take 5 mg by mouth daily.    Marland Kitchen torsemide (DEMADEX) 20 MG tablet Take 1 tablet (20 mg total) by mouth 2 (two) times daily. 60 tablet 12  . TRAVATAN Z 0.004 % SOLN ophthalmic solution Place 1 drop into both eyes at bedtime.     Marland Kitchen warfarin (COUMADIN) 5 MG tablet Take 1 tablet daily or  as directed (Patient taking differently: Take 5 mg by mouth daily. ) 45 tablet 3   No current facility-administered medications for this visit.    Discontinued Meds:   There are no discontinued medications.  Patient Active Problem List   Diagnosis Date Noted  . Chronic venous insufficiency 02/19/2015  . CHF (congestive heart failure) (HCC) 02/17/2015  . Tricuspid valve regurgitation 02/17/2015  . Encounter for therapeutic drug monitoring 03/11/2013  . Long term current use of anticoagulant 05/05/2010  . Mixed hyperlipidemia 09/30/2009  . Essential hypertension, benign 09/24/2008  . Atrial fibrillation (HCC) 09/24/2008    LABS    Component Value Date/Time   NA 135 03/02/2015 1456   NA 141 02/25/2015 0603   NA 143 02/24/2015 0704   K 4.5 03/02/2015 1456   K 4.7 02/25/2015 0603   K 4.4 02/24/2015 0704   CL 105 03/02/2015 1456   CL 100* 02/25/2015 0603   CL 102 02/24/2015 0704   CO2 27 03/02/2015 1456   CO2 32 02/25/2015 0603   CO2 34* 02/24/2015 0704   GLUCOSE 81 03/02/2015 1456   GLUCOSE 126* 02/25/2015 0603   GLUCOSE 123*  02/24/2015 0704   BUN 26* 03/02/2015 1456   BUN 28* 02/25/2015 0603   BUN 29* 02/24/2015 0704   CREATININE 1.70* 03/02/2015 1456   CREATININE 1.32* 02/25/2015 0603   CREATININE 1.47* 02/24/2015 0704   CREATININE 1.37* 02/23/2015 0610   CALCIUM 9.0 03/02/2015 1456   CALCIUM 9.1 02/25/2015 0603   CALCIUM 8.7* 02/24/2015 0704   GFRNONAA 36* 02/25/2015 0603   GFRNONAA 32* 02/24/2015 0704   GFRNONAA 35* 02/23/2015 0610   GFRAA 42* 02/25/2015 0603   GFRAA 37* 02/24/2015 0704   GFRAA 40* 02/23/2015 0610   CMP     Component Value Date/Time   NA 135 03/02/2015 1456   K 4.5 03/02/2015 1456   CL 105 03/02/2015 1456   CO2 27 03/02/2015 1456   GLUCOSE 81 03/02/2015 1456   BUN 26* 03/02/2015 1456   CREATININE 1.70* 03/02/2015 1456   CREATININE 1.32* 02/25/2015 0603   CALCIUM 9.0 03/02/2015 1456   PROT 7.5 05/10/2011 0930   ALBUMIN 4.3 05/10/2011  0930   AST 22 05/10/2011 0930   ALT 10 05/10/2011 0930   ALKPHOS 111 05/10/2011 0930   BILITOT 0.8 05/10/2011 0930   GFRNONAA 36* 02/25/2015 0603   GFRAA 42* 02/25/2015 0603       Component Value Date/Time   WBC 8.6 02/17/2015 1759   WBC 6.8 09/12/2008 0854   HGB 11.3* 02/17/2015 1759   HGB 12.4 07/13/2010 1244   HGB 13.9 09/12/2008 0854   HCT 34.5* 02/17/2015 1759   HCT 37.6 07/13/2010 1244   HCT 41.0 09/12/2008 0854   MCV 97.5 02/17/2015 1759   MCV 95.0 09/12/2008 0854    Lipid Panel     Component Value Date/Time   CHOL 188 05/10/2011 0930   TRIG 67 05/10/2011 0930   HDL 64 05/10/2011 0930   CHOLHDL 2.9 05/10/2011 0930   VLDL 13 05/10/2011 0930   LDLCALC 111* 05/10/2011 0930    ABG    Component Value Date/Time   PHART 7.422* 09/29/2008 1515   PCO2ART 36.9 09/29/2008 1515   PO2ART 85.8 09/29/2008 1515   HCO3 23.6 09/29/2008 1515   TCO2 20.9 09/29/2008 1515   ACIDBASEDEF 0.3 09/29/2008 1515   O2SAT 96.9 09/29/2008 1515     Lab Results  Component Value Date   TSH 3.793 02/17/2015   BNP (last 3 results)  Recent Labs  02/17/15 1759  BNP 982.0*    ProBNP (last 3 results) No results for input(s): PROBNP in the last 8760 hours.  Cardiac Panel (last 3 results) No results for input(s): CKTOTAL, CKMB, TROPONINI, RELINDX in the last 72 hours.  Iron/TIBC/Ferritin/ %Sat No results found for: IRON, TIBC, FERRITIN, IRONPCTSAT   EKG Orders placed or performed during the hospital encounter of 02/17/15  . EKG 12-Lead  . EKG 12-Lead  . EKG 12-Lead  . EKG 12-Lead  . EKG 12-Lead  . EKG 12-Lead  . EKG     Prior Assessment and Plan Problem List as of 04/02/2015      Cardiovascular and Mediastinum   Essential hypertension, benign   Last Assessment & Plan 11/11/2013 Office Visit Written 11/11/2013  2:24 PM by Jonelle Sidle, MD    No changes made in current regimen. Keep followup with Dr. Juanetta Gosling.      Atrial fibrillation [facility])   Last Assessment & Plan  11/11/2013 Office Visit Written 11/11/2013  2:24 PM by Jonelle Sidle, MD    Chronic, continue strategy of heart rate control and anticoagulation.  CHF (congestive heart failure) (HCC)   Tricuspid valve regurgitation   Chronic venous insufficiency     Other   Mixed hyperlipidemia   Last Assessment & Plan 05/03/2013 Office Visit Written 05/03/2013  1:33 PM by Jonelle Sidle, MD    She continues on Lipitor. Keep followup with Dr. Juanetta Gosling.      Long term current use of anticoagulant   Encounter for therapeutic drug monitoring       Imaging: No results found.

## 2015-04-02 NOTE — Progress Notes (Signed)
Cardiology Office Note   Date:  04/02/2015   ID:  Florabelle, Cardin 1930-06-10, MRN [id number]  PCP:  Fredirick Maudlin, MD  Cardiologist:  [place]/ Joni Reining, NP   No chief complaint on file.     History of Present Illness: Melissa [name] is a 82 y.o. female who presents for ongoing assessment and management of atrial fibrillation,CHADS VASC Score of 5, on Coumadin, chronic lower extremity edema (dependent), hypertension, with history of diabetes.she was admitted last month with CHF, started on torsemide, and diltiazem for heart rate control for atrial fibrillation.  On last office visit.  She had worsening coughing and congestion with worsening lower extremity edema.  The patient was continued on torsemide 20 mg twice a day, she is to take it on an empty stomach, she was recommended to wear her support hose at home.  Followup labs were completed along with a chest x-ray. Chest x-ray revealed cardiomegaly with pulmonary venous congestion, but no frank edema, and atherosclerosis. She was advised to take an extra dose of torsemide, for a total of 3 times a day dosing for one day, and then back to twice a day.  She was advised to take an additional potassium with the extra torsemide.  Echo 02/18/2015 Left ventricle: The cavity size was normal. There was moderate concentric hypertrophy. Systolic function was normal. The estimated ejection fraction was in the range of 60% to 65%. The study was not technically sufficient to allow evaluation of LV diastolic dysfunction due to atrial fibrillation. - Ventricular septum: The contour showed diastolic flattening and systolic flattening. These changes are consistent with RV volume and pressure overload. - Aortic valve: Moderately calcified annulus. Trileaflet; mildly calcified leaflets. There was no stenosis. - Mitral valve: There was mild to moderate regurgitation. - Left atrium: The atrium was severely dilated. - Right  atrium: The atrium was severely dilated. Central venous pressure (est): 8 mm Hg. - Atrial septum: The septum bowed from left to right, consistent with increased left atrial pressure. - Tricuspid valve: There was moderate-severe regurgitation. - Pulmonic valve: There was mild to moderate regurgitation. - Pulmonary arteries: Systolic pressure was severely increased. PA peak pressure: 75 mm Hg (S). - Systemic veins: IVC dilated with normal respiratory variation. - Pericardium, extracardiac: A trivial pericardial effusion was identified.  A day.  She is feeling great.  Blood pressure is better controlled.  Weight is down 14 pounds, she is breathing better, she denies any recurrent chest pain, dyspnea on exertion, she is feeling stronger. Past Medical History  Diagnosis Date  . Essential hypertension, benign   . Atrial fibrillation (HCC)   . Type 2 diabetes mellitus (HCC)   . Mixed hyperlipidemia     Past Surgical History  Procedure Laterality Date  . Total hip arthroplasty  01/14/03    Left     Current Outpatient Prescriptions  Medication Sig Dispense Refill  . atorvastatin (LIPITOR) 20 MG tablet Take 20 mg by mouth daily.    Marland Kitchen diltiazem (CARDIZEM CD) 120 MG 24 hr capsule Take 1 capsule (120 mg total) by mouth daily. 30 capsule 12  . glimepiride (AMARYL) 2 MG tablet Take 2 mg by mouth daily.      . hydrALAZINE (APRESOLINE) 50 MG tablet Take 1 tablet (50 mg total) by mouth every 8 (eight) hours. 90 tablet 12  . HYDROcodone-acetaminophen (NORCO/VICODIN) 5-325 MG tablet Take 1 tablet by mouth 4 (four) times daily.     Marland Kitchen lisinopril (PRINIVIL,ZESTRIL) 40 MG tablet Take 40 mg  by mouth 2 (two) times daily.      . metFORMIN (GLUCOPHAGE) 500 MG tablet Take 500 mg by mouth 2 (two) times daily with meals.      . potassium chloride SA (K-DUR,KLOR-CON) 20 MEQ tablet Take 2 tablets (40 mEq total) by mouth 2 (two) times daily. 120 tablet 5  . solifenacin (VESICARE) 5 MG tablet Take 5 mg by  mouth daily.    Marland Kitchen torsemide (DEMADEX) 20 MG tablet Take 1 tablet (20 mg total) by mouth 2 (two) times daily. 60 tablet 12  . TRAVATAN Z 0.004 % SOLN ophthalmic solution Place 1 drop into both eyes at bedtime.     Marland Kitchen warfarin (COUMADIN) 5 MG tablet Take 1 tablet daily or as directed (Patient taking differently: Take 5 mg by mouth daily. ) 45 tablet 3   No current facility-administered medications for this visit.    Allergies:   Review of patient's allergies indicates no known allergies.    Social History:  The patient  reports that she has never smoked. She has never used smokeless tobacco. She reports that she does not drink alcohol or use illicit drugs.   Family History:  The patient's family history includes Diabetes type II in her father and mother; Hypertension in her father.    ROS: All other systems are reviewed and negative. Unless otherwise mentioned in H&P    PHYSICAL EXAM: VS:  Ht 5\' 4"  (1.626 m)  Wt 142 lb (64.411 kg)  BMI 24.36 kg/m2 , BMI Body mass index is 24.36 kg/(m^2). GEN: Well nourished, well developed, in no acute distress HEENT: normal Neck: no JVD, carotid bruits, or masses Cardiac: IRRR,  no murmurs, rubs, or gallops,no edema  Respiratory:  clear to auscultation bilaterally, normal work of breathing GI: soft, nontender, nondistended, + BS MS: no deformity or atrophy Skin: warm and dry, no rash Neuro:  Strength and sensation are intact Psych: euthymic mood, full affect   Recent Labs: 02/17/2015: B Natriuretic Peptide 982.0*; Hemoglobin 11.3*; Platelets 235; TSH 3.793 03/02/2015: BUN 26*; Creat 1.70*; Magnesium 1.8; Potassium 4.5; Sodium 135    Lipid Panel    Component Value Date/Time   CHOL 188 05/10/2011 0930   TRIG 67 05/10/2011 0930   HDL 64 05/10/2011 0930   CHOLHDL 2.9 05/10/2011 0930   VLDL 13 05/10/2011 0930   LDLCALC 111* 05/10/2011 0930      Wt Readings from Last 3 Encounters:  04/02/15 142 lb (64.411 kg)  03/02/15 156 lb (70.761 kg)   02/25/15 160 lb 12.8 oz (72.938 kg)      ASSESSMENT AND PLAN:  1. Atrial fibrillation: Rate is very well controlled.  Currently.  She is following in our Coumadin clinic and will be seen next week for dosing and INR evaluation.  Continue diltiazem  2.Diastolic CHF: weight is down 12 pounds, and she is feeling much better.  She is to continue to take her medications as directed, torsemide 20 mg twice a day.  Along with potassium.  We will see her in 6 months.  She knows to call sooner should she begin to gain weight or have troubles with fluid retention.  3. Hypertension:BP is excellently controlled now, she is to continue her current medication regimen, to include lisinopril, and diltiazem.   Current medicines are reviewed at length with the patient today.    Labs/ tests ordered today include:  No orders of the defined types were placed in this encounter.     Disposition:   FU with  6 months Signed, Joni Reining, NP  04/02/2015 1:42 PM    [facility] [address], [place], [place] [address] Phone: [phone]; Fax: [phone]

## 2015-04-02 NOTE — Patient Instructions (Signed)
Your physician wants you to follow-up in: 6 months with K Lawrence NP You will receive a reminder letter in the mail two months in advance. If you don't receive a letter, please call our office to schedule the follow-up appointment.    Your physician recommends that you continue on your current medications as directed. Please refer to the Current Medication list given to you today.     If you need a refill on your cardiac medications before your next appointment, please call your pharmacy.     Thank you for choosing [facility] !        

## 2015-04-06 ENCOUNTER — Ambulatory Visit (INDEPENDENT_AMBULATORY_CARE_PROVIDER_SITE_OTHER): Payer: Medicare HMO | Admitting: *Deleted

## 2015-04-06 DIAGNOSIS — Z5181 Encounter for therapeutic drug level monitoring: Secondary | ICD-10-CM

## 2015-04-06 DIAGNOSIS — I4891 Unspecified atrial fibrillation: Secondary | ICD-10-CM | POA: Diagnosis not present

## 2015-04-06 LAB — POCT INR: INR: 2.2

## 2015-05-04 ENCOUNTER — Ambulatory Visit (INDEPENDENT_AMBULATORY_CARE_PROVIDER_SITE_OTHER): Payer: Medicare HMO | Admitting: *Deleted

## 2015-05-04 DIAGNOSIS — I4891 Unspecified atrial fibrillation: Secondary | ICD-10-CM | POA: Diagnosis not present

## 2015-05-04 DIAGNOSIS — Z5181 Encounter for therapeutic drug level monitoring: Secondary | ICD-10-CM | POA: Diagnosis not present

## 2015-05-04 LAB — POCT INR: INR: 2.9

## 2015-06-01 ENCOUNTER — Ambulatory Visit (INDEPENDENT_AMBULATORY_CARE_PROVIDER_SITE_OTHER): Payer: Medicare HMO | Admitting: *Deleted

## 2015-06-01 DIAGNOSIS — Z5181 Encounter for therapeutic drug level monitoring: Secondary | ICD-10-CM

## 2015-06-01 DIAGNOSIS — I4891 Unspecified atrial fibrillation: Secondary | ICD-10-CM

## 2015-06-01 LAB — POCT INR: INR: 2.6

## 2015-06-15 ENCOUNTER — Other Ambulatory Visit: Payer: Self-pay | Admitting: *Deleted

## 2015-06-15 MED ORDER — WARFARIN SODIUM 5 MG PO TABS: ORAL_TABLET | ORAL | Status: DC

## 2015-07-09 DIAGNOSIS — E1129 Type 2 diabetes mellitus with other diabetic kidney complication: Secondary | ICD-10-CM | POA: Diagnosis not present

## 2015-07-09 DIAGNOSIS — I5032 Chronic diastolic (congestive) heart failure: Secondary | ICD-10-CM | POA: Diagnosis not present

## 2015-07-09 DIAGNOSIS — I482 Chronic atrial fibrillation: Secondary | ICD-10-CM | POA: Diagnosis not present

## 2015-07-09 DIAGNOSIS — I10 Essential (primary) hypertension: Secondary | ICD-10-CM | POA: Diagnosis not present

## 2015-07-13 ENCOUNTER — Ambulatory Visit (INDEPENDENT_AMBULATORY_CARE_PROVIDER_SITE_OTHER): Payer: Medicare Other | Admitting: *Deleted

## 2015-07-13 DIAGNOSIS — I4891 Unspecified atrial fibrillation: Secondary | ICD-10-CM | POA: Diagnosis not present

## 2015-07-13 DIAGNOSIS — Z5181 Encounter for therapeutic drug level monitoring: Secondary | ICD-10-CM

## 2015-07-13 LAB — POCT INR: INR: 3.2

## 2015-08-10 ENCOUNTER — Ambulatory Visit (INDEPENDENT_AMBULATORY_CARE_PROVIDER_SITE_OTHER): Payer: Medicare Other | Admitting: *Deleted

## 2015-08-10 DIAGNOSIS — I4891 Unspecified atrial fibrillation: Secondary | ICD-10-CM | POA: Diagnosis not present

## 2015-08-10 DIAGNOSIS — Z5181 Encounter for therapeutic drug level monitoring: Secondary | ICD-10-CM

## 2015-08-10 LAB — POCT INR: INR: 2.4

## 2015-09-14 ENCOUNTER — Ambulatory Visit (INDEPENDENT_AMBULATORY_CARE_PROVIDER_SITE_OTHER): Payer: Medicare Other | Admitting: *Deleted

## 2015-09-14 DIAGNOSIS — I4891 Unspecified atrial fibrillation: Secondary | ICD-10-CM

## 2015-09-14 DIAGNOSIS — Z5181 Encounter for therapeutic drug level monitoring: Secondary | ICD-10-CM

## 2015-09-14 LAB — POCT INR: INR: 3.3

## 2015-09-28 ENCOUNTER — Encounter (HOSPITAL_COMMUNITY): Payer: Self-pay | Admitting: Emergency Medicine

## 2015-09-28 ENCOUNTER — Emergency Department (HOSPITAL_COMMUNITY)
Admission: EM | Admit: 2015-09-28 | Discharge: 2015-09-28 | Disposition: A | Discharge status: Home / Self Care | Payer: Medicare Other | Attending: Emergency Medicine | Admitting: Emergency Medicine

## 2015-09-28 ENCOUNTER — Emergency Department (HOSPITAL_COMMUNITY): Payer: Medicare Other

## 2015-09-28 DIAGNOSIS — S199XXA Unspecified injury of neck, initial encounter: Secondary | ICD-10-CM | POA: Diagnosis not present

## 2015-09-28 DIAGNOSIS — M5030 Other cervical disc degeneration, unspecified cervical region: Secondary | ICD-10-CM | POA: Insufficient documentation

## 2015-09-28 DIAGNOSIS — I87309 Chronic venous hypertension (idiopathic) without complications of unspecified lower extremity: Secondary | ICD-10-CM | POA: Diagnosis not present

## 2015-09-28 DIAGNOSIS — Z79899 Other long term (current) drug therapy: Secondary | ICD-10-CM | POA: Diagnosis not present

## 2015-09-28 DIAGNOSIS — M542 Cervicalgia: Secondary | ICD-10-CM

## 2015-09-28 DIAGNOSIS — E041 Nontoxic single thyroid nodule: Secondary | ICD-10-CM | POA: Insufficient documentation

## 2015-09-28 DIAGNOSIS — I110 Hypertensive heart disease with heart failure: Secondary | ICD-10-CM | POA: Insufficient documentation

## 2015-09-28 DIAGNOSIS — M50322 Other cervical disc degeneration at C5-C6 level: Secondary | ICD-10-CM | POA: Diagnosis not present

## 2015-09-28 DIAGNOSIS — E119 Type 2 diabetes mellitus without complications: Secondary | ICD-10-CM | POA: Insufficient documentation

## 2015-09-28 DIAGNOSIS — I509 Heart failure, unspecified: Secondary | ICD-10-CM | POA: Insufficient documentation

## 2015-09-28 DIAGNOSIS — I10 Essential (primary) hypertension: Secondary | ICD-10-CM

## 2015-09-28 DIAGNOSIS — Z7984 Long term (current) use of oral hypoglycemic drugs: Secondary | ICD-10-CM | POA: Diagnosis not present

## 2015-09-28 HISTORY — DX: Other intervertebral disc degeneration, lumbar region: M51.36

## 2015-09-28 LAB — I-STAT CHEM 8, ED
BUN: 34 mg/dL — ABNORMAL HIGH (ref 6–20)
CREATININE: 1.7 mg/dL — AB (ref 0.44–1.00)
Calcium, Ion: 1.14 mmol/L (ref 1.12–1.23)
Chloride: 102 mmol/L (ref 101–111)
Glucose, Bld: 117 mg/dL — ABNORMAL HIGH (ref 65–99)
HEMATOCRIT: 36 % (ref 36.0–46.0)
HEMOGLOBIN: 12.2 g/dL (ref 12.0–15.0)
POTASSIUM: 5.2 mmol/L — AB (ref 3.5–5.1)
Sodium: 138 mmol/L (ref 135–145)
TCO2: 26 mmol/L (ref 0–100)

## 2015-09-28 LAB — PROTIME-INR
INR: 2.28
PROTHROMBIN TIME: 25.5 s — AB (ref 11.4–15.2)

## 2015-09-28 LAB — I-STAT TROPONIN, ED: TROPONIN I, POC: 0.03 ng/mL (ref 0.00–0.08)

## 2015-09-28 MED ORDER — METHOCARBAMOL 500 MG PO TABS: 500.0000 mg | ORAL_TABLET | Freq: Two times a day (BID) | ORAL | 0 refills | Status: DC | PRN

## 2015-09-28 MED ORDER — HYDRALAZINE HCL 25 MG PO TABS
50.0000 mg | ORAL_TABLET | Freq: Once | ORAL | Status: AC
  Administered 2015-09-28: 50 mg via ORAL
  Filled 2015-09-28: qty 2

## 2015-09-28 MED ORDER — OXYCODONE-ACETAMINOPHEN 5-325 MG PO TABS
1.0000 | ORAL_TABLET | Freq: Once | ORAL | Status: AC
  Administered 2015-09-28: 1 via ORAL
  Filled 2015-09-28: qty 1

## 2015-09-28 NOTE — Discharge Instructions (Signed)
Take the prescription as directed.  Apply moist heat or ice to the area(s) of discomfort, for 15 minutes at a time, several times per day for the next few days.  Do not fall asleep on a heating or ice pack. Take your usual prescriptions as previously directed.  Take your blood pressure only once per day, either in the morning approximately 1 hour after you take your medicine(s) or in the evening before you go to bed, once or twice per week.  Always sit quietly for at least 15 minutes before taking your blood pressure.  Keep a diary of your blood pressures to show your doctor at your follow up office visit.   Your CT scan showed an incidental finding: "Thyroid lesions measuring up to 2.2 cm in diameter in the left lobe. Consider further evaluation with thyroid ultrasound."  Call your regular medical doctor today to schedule a follow up appointment this week to further investigate this finding.  Return to the Emergency Department immediately if worsening.

## 2015-09-28 NOTE — ED Triage Notes (Addendum)
Patient states she woke up last night with pain to back and right side of neck. States "Sometimes if I lay wrong it will hurt like that." Patient's B/P 217/99 in left arm and 214/99 in right arm at triage. Patient states she took medication at home today.

## 2015-09-28 NOTE — ED Provider Notes (Signed)
[facility]Y DEPT Provider Note   CSN: [id number] Arrival date & time: 09/28/15  1222     History   Chief Complaint Chief Complaint  Patient presents with  . Neck Pain  . Hypertension    HPI Melissa [name] is a 82 y.o. female.   Neck Pain    Hypertension     Pt was seen at 1330. Per pt, c/o gradual onset and persistence of constant right sided neck "pain" that began around midnight last night. Pt describes the pain as "sore."  states she "gets a pain like this when I lay wrong." Pt took her usual pain medication at 0100 without change. Denies headache, no focal motor weakness, no tingling/numbness in extremities, no CP/SOB, no abd pain, no N/V/D.     Past Medical History:  Diagnosis Date  . Atrial fibrillation (HCC)   . DDD (degenerative disc disease), lumbar   . Essential hypertension, benign   . Mixed hyperlipidemia   . Type 2 diabetes mellitus [facility])     Patient Active Problem List   Diagnosis Date Noted  . Chronic venous insufficiency 02/19/2015  . CHF (congestive heart failure) (HCC) 02/17/2015  . Tricuspid valve regurgitation 02/17/2015  . Encounter for therapeutic drug monitoring 03/11/2013  . Long term current use of anticoagulant 05/05/2010  . Mixed hyperlipidemia 09/30/2009  . Essential hypertension, benign 09/24/2008  . Atrial fibrillation (HCC) 09/24/2008    Past Surgical History:  Procedure Laterality Date  . TOTAL HIP ARTHROPLASTY  01/14/03   Left       Home Medications    Prior to Admission medications   Medication Sig Start Date End Date Taking? Authorizing Provider  atorvastatin (LIPITOR) 20 MG tablet Take 20 mg by mouth daily.   Yes Historical Provider, MD  diltiazem (CARDIZEM CD) 120 MG 24 hr capsule Take 1 capsule (120 mg total) by mouth daily. 02/25/15  Yes Kari Baars, MD  glimepiride (AMARYL) 2 MG tablet Take 2 mg by mouth daily.     Yes Historical Provider, MD  hydrALAZINE (APRESOLINE) 50 MG tablet Take 1 tablet (50 mg  total) by mouth every 8 (eight) hours. 02/25/15  Yes Kari Baars, MD  HYDROcodone-acetaminophen (NORCO/VICODIN) 5-325 MG tablet Take 1 tablet by mouth 4 (four) times daily.  09/10/14  Yes Historical Provider, MD  lisinopril (PRINIVIL,ZESTRIL) 40 MG tablet Take 40 mg by mouth 2 (two) times daily.     Yes Historical Provider, MD  metFORMIN (GLUCOPHAGE) 500 MG tablet Take 500 mg by mouth 2 (two) times daily with meals.     Yes Historical Provider, MD  potassium chloride SA (K-DUR,KLOR-CON) 20 MEQ tablet Take 2 tablets (40 mEq total) by mouth 2 (two) times daily. 02/25/15  Yes Kari Baars, MD  solifenacin (VESICARE) 5 MG tablet Take 5 mg by mouth daily.   Yes Historical Provider, MD  torsemide (DEMADEX) 20 MG tablet Take 1 tablet (20 mg total) by mouth 2 (two) times daily. 02/25/15  Yes Kari Baars, MD  TRAVATAN Z 0.004 % SOLN ophthalmic solution Place 1 drop into both eyes at bedtime.  11/08/14  Yes Historical Provider, MD  warfarin (COUMADIN) 5 MG tablet Take 1 tablet daily or as directed Patient taking differently: Take 5 mg by mouth every morning. Take 1 tablet daily or as directed 06/15/15  Yes Jonelle Sidle, MD    Family History Family History  Problem Relation Age of Onset  . Diabetes type II Mother   . Diabetes type II Father   . Hypertension  Father     Social History Social History  Substance Use Topics  . Smoking status: Never Smoker  . Smokeless tobacco: Never Used  . Alcohol use No     Allergies   Review of patient's allergies indicates no known allergies.   Review of Systems Review of Systems  Musculoskeletal: Positive for neck pain.  ROS: Statement: All systems negative except as marked or noted in the HPI; Constitutional: Negative for fever and chills. ; ; Eyes: Negative for eye pain, redness and discharge. ; ; ENMT: Negative for ear pain, hoarseness, nasal congestion, sinus pressure and sore throat. ; ; Cardiovascular: Negative for chest pain, palpitations,  diaphoresis, dyspnea and peripheral edema. ; ; Respiratory: Negative for cough, wheezing and stridor. ; ; Gastrointestinal: Negative for nausea, vomiting, diarrhea, abdominal pain, blood in stool, hematemesis, jaundice and rectal bleeding. . ; ; Genitourinary: Negative for dysuria, flank pain and hematuria. ; ; Musculoskeletal: +neck pain. Negative for back pain. Negative for swelling and trauma.; ; Skin: Negative for pruritus, rash, abrasions, blisters, bruising and skin lesion.; ; Neuro: Negative for headache, lightheadedness and neck stiffness. Negative for weakness, altered level of consciousness, altered mental status, extremity weakness, paresthesias, involuntary movement, seizure and syncope.        Physical Exam Updated Vital Signs BP (!) 227/88 (BP Location: Left Arm)   Pulse 62   Temp 98.5 F (36.9 C) (Oral)   Resp 16   Ht 5\' 4"  (1.626 m)   Wt 144 lb (65.3 kg)   SpO2 96%   BMI 24.72 kg/m    Patient Vitals for the past 24 hrs:  BP Temp Temp src Pulse Resp SpO2 Height Weight  09/28/15 1615 - 98.2 F (36.8 C) Oral - - - - -  09/28/15 1600 161/82 - - - 18 - - -  09/28/15 1530 (!) 139/108 - - (!) 53 16 98 % - -  09/28/15 1512 - - - (!) 54 18 100 % - -  09/28/15 1506 (!) 174/144 - - - - - - -  09/28/15 1404 (!) 227/88 - - 62 16 96 % - -  09/28/15 1400 (!) 227/88 - - (!) 53 15 94 % - -  09/28/15 1330 192/85 - - (!) 58 12 99 % - -  09/28/15 1300 (!) 204/93 - - (!) 58 17 100 % - -  09/28/15 1235 (!) 214/99 - - - - - 5\' 4"  (1.626 m) 144 lb (65.3 kg)  09/28/15 1230 (!) 217/99 98.5 F (36.9 C) Oral 72 15 97 % - -      Physical Exam 1335: Physical examination:  Nursing notes reviewed; Vital signs and O2 SAT reviewed;  Constitutional: Well developed, Well nourished, Well hydrated, In no acute distress; Head:  Normocephalic, atraumatic; Eyes: EOMI, PERRL, No scleral icterus; ENMT: Mouth and pharynx normal, Mucous membranes moist; Neck: Supple, Full range of motion, No  lymphadenopathy; Cardiovascular: Irregular rate and rhythm, No gallop; Respiratory: Breath sounds clear & equal bilaterally, No wheezes.  Speaking full sentences with ease, Normal respiratory effort/excursion; Chest: Nontender, Movement normal; Abdomen: Soft, Nontender, Nondistended, Normal bowel sounds; Genitourinary: No CVA tenderness; Spine:  No midline CS, TS, LS tenderness. +TTP right hypertonic trapezius muscle, +TTP right cervical paraspinal muscles. No rash.;; Extremities: Pulses normal,  Motor strength at shoulders normal.  Sensation intact over deltoid regions, distal NMS intact with right hand having intact and equal sensation and strength in the distribution of the median, radial, and ulnar nerve function compared to opposite  side.  Strong radial pulse.  +FROM right elbow with intact motor strength biceps and triceps muscles to resistance.  No tenderness, No edema, No calf edema or asymmetry.; Neuro: AA&Ox3, Major CN grossly intact. No facial droop. Speech clear. Grips equal. Strength 5/5 equal bilat UE's and LE's. Normal cerebellar testing bilat UE's (finger-nose). No gross focal motor or sensory deficits in extremities.; Skin: Color normal, Warm, Dry.   ED Treatments / Results  Labs (all labs ordered are listed, but only abnormal results are displayed)   EKG  EKG Interpretation  Date/Time:  Monday September 28 2015 12:50:44 EDT Ventricular Rate:  62 PR Interval:    QRS Duration: 122 QT Interval:  427 QTC Calculation: 434 R Axis:   8 Text Interpretation:  Atrial fibrillation Nonspecific intraventricular conduction delay Borderline repolarization abnormality When compared with ECG of 02/17/2015 No significant change was found Confirmed by [facility] Hospital[facility]  MD, Nicholos JohnsKATHLEEN [phone]([id number]) on 09/28/2015 1:40:15 PM       Radiology   Procedures Procedures (including critical care time)  Medications Ordered in ED Medications  oxyCODONE-acetaminophen (PERCOCET/ROXICET) 5-325 MG per tablet 1 tablet (1  tablet Oral Given 09/28/15 1404)     Initial Impression / Assessment and Plan / ED Course  I have reviewed the triage vital signs and the nursing notes.  Pertinent labs & imaging results that were available during my care of the patient were reviewed by me and considered in my medical decision making (see chart for details).   MDM Reviewed: previous chart, nursing note and vitals Reviewed previous: labs Interpretation: labs and CT scan   Results for orders placed or performed during the hospital encounter of 09/28/15  Protime-INR  Result Value Ref Range   Prothrombin Time 25.5 (H) 11.4 - 15.2 seconds   INR 2.28   I-stat Chem 8, ED  Result Value Ref Range   Sodium 138 135 - 145 mmol/L   Potassium 5.2 (H) 3.5 - 5.1 mmol/L   Chloride 102 101 - 111 mmol/L   BUN 34 (H) 6 - 20 mg/dL   Creatinine, Ser 6.041.70 (H) 0.44 - 1.00 mg/dL   Glucose, Bld 540117 (H) 65 - 99 mg/dL   Calcium, Ion 9.811.14 1.911.12 - 1.23 mmol/L   TCO2 26 0 - 100 mmol/L   Hemoglobin 12.2 12.0 - 15.0 g/dL   HCT 47.836.0 29.536.0 - 62.146.0 %  I-stat troponin, ED  Result Value Ref Range   Troponin i, poc 0.03 0.00 - 0.08 ng/mL   Comment 3           Ct Cervical Spine Wo Contrast Result Date: 09/28/2015 CLINICAL DATA:  Right side neck pain which began last night and woke the patient up. No known injury. Initial encounter. EXAM: CT CERVICAL SPINE WITHOUT CONTRAST TECHNIQUE: Multidetector CT imaging of the cervical spine was performed without intravenous contrast. Multiplanar CT image reconstructions were also generated. COMPARISON:  None. FINDINGS: No fracture is identified. Trace retrolisthesis C4 on C5 is noted. There is marked loss of disc space height at C4-5 and C5-6 with disc bulging and endplate spurring. Degenerative disc disease appears worst at C5-6 where there is a right paracentral protrusion extending toward the right foramen which contacts and deforms the cord. Carotid atherosclerosis is seen. Lung apices are clear. Hypoattenuating  lesions are seen in both right and left lobes of the thyroid with calcifications present. The larger lesions in the left lobe measuring 2.2 x 1.6 cm IMPRESSION: No acute abnormality. Cervical spondylosis appearing worst at C5-6 as described above.  Atherosclerosis. Thyroid lesions measuring up to 2.2 cm in diameter in the left lobe. Consider further evaluation with thyroid ultrasound. If patient is clinically hyperthyroid, consider nuclear medicine thyroid uptake and scan. Electronically Signed   By: Drusilla Kannerhomas  Dalessio M.D.   On: 09/28/2015 15:16    1325:  BUN/Cr per baseline. CT CS with DDD. Pt states she "feels better" but "likes the medicine I have home more." Will continue hydrocodone and add low dose robaxin; family agreeable with this plan. Will need f/u thyroid by PMD (pt has appt in 2 weeks). Will dose pt's own BP med while in the ED. Pt states she is ready to go home now. Dx and testing d/w pt and family.  Questions answered.  Verb understanding, agreeable to d/c home with outpt f/u.    Final Clinical Impressions(s) / ED Diagnoses   Final diagnoses:  None    New Prescriptions New Prescriptions   No medications on file      Samuel JesterKathleen Nazaire Cordial, DO 10/02/15 1443

## 2015-10-05 DIAGNOSIS — M542 Cervicalgia: Secondary | ICD-10-CM | POA: Diagnosis not present

## 2015-10-05 DIAGNOSIS — L255 Unspecified contact dermatitis due to plants, except food: Secondary | ICD-10-CM | POA: Diagnosis not present

## 2015-10-07 ENCOUNTER — Ambulatory Visit (INDEPENDENT_AMBULATORY_CARE_PROVIDER_SITE_OTHER): Payer: Medicare Other | Admitting: *Deleted

## 2015-10-07 DIAGNOSIS — I4891 Unspecified atrial fibrillation: Secondary | ICD-10-CM

## 2015-10-07 DIAGNOSIS — Z5181 Encounter for therapeutic drug level monitoring: Secondary | ICD-10-CM

## 2015-10-07 LAB — POCT INR: INR: 3.4

## 2015-10-14 DIAGNOSIS — Z0000 Encounter for general adult medical examination without abnormal findings: Secondary | ICD-10-CM | POA: Diagnosis not present

## 2015-10-14 DIAGNOSIS — Z23 Encounter for immunization: Secondary | ICD-10-CM | POA: Diagnosis not present

## 2015-10-15 DIAGNOSIS — M545 Low back pain: Secondary | ICD-10-CM | POA: Diagnosis not present

## 2015-10-15 DIAGNOSIS — M1990 Unspecified osteoarthritis, unspecified site: Secondary | ICD-10-CM | POA: Diagnosis not present

## 2015-10-23 DIAGNOSIS — H409 Unspecified glaucoma: Secondary | ICD-10-CM | POA: Diagnosis not present

## 2015-10-23 DIAGNOSIS — E785 Hyperlipidemia, unspecified: Secondary | ICD-10-CM | POA: Diagnosis not present

## 2015-10-23 DIAGNOSIS — E118 Type 2 diabetes mellitus with unspecified complications: Secondary | ICD-10-CM | POA: Diagnosis not present

## 2015-10-23 DIAGNOSIS — E1129 Type 2 diabetes mellitus with other diabetic kidney complication: Secondary | ICD-10-CM | POA: Diagnosis not present

## 2015-10-28 ENCOUNTER — Ambulatory Visit (INDEPENDENT_AMBULATORY_CARE_PROVIDER_SITE_OTHER): Payer: Medicare Other | Admitting: *Deleted

## 2015-10-28 DIAGNOSIS — I4891 Unspecified atrial fibrillation: Secondary | ICD-10-CM | POA: Diagnosis not present

## 2015-10-28 DIAGNOSIS — Z5181 Encounter for therapeutic drug level monitoring: Secondary | ICD-10-CM

## 2015-10-28 LAB — POCT INR: INR: 3.3

## 2015-11-05 ENCOUNTER — Encounter: Payer: Self-pay | Admitting: Cardiology

## 2015-11-05 ENCOUNTER — Ambulatory Visit (INDEPENDENT_AMBULATORY_CARE_PROVIDER_SITE_OTHER): Payer: Medicare Other | Admitting: Cardiology

## 2015-11-05 VITALS — BP 183/75 | HR 77 | Ht 64.0 in | Wt 156.0 lb

## 2015-11-05 DIAGNOSIS — I482 Chronic atrial fibrillation, unspecified: Secondary | ICD-10-CM

## 2015-11-05 DIAGNOSIS — I10 Essential (primary) hypertension: Secondary | ICD-10-CM

## 2015-11-05 DIAGNOSIS — IMO0002 Reserved for concepts with insufficient information to code with codable children: Secondary | ICD-10-CM

## 2015-11-05 DIAGNOSIS — I272 Other secondary pulmonary hypertension: Secondary | ICD-10-CM

## 2015-11-05 MED ORDER — HYDRALAZINE HCL 50 MG PO TABS: 75.0000 mg | ORAL_TABLET | Freq: Three times a day (TID) | ORAL | 6 refills | Status: DC

## 2015-11-05 NOTE — Patient Instructions (Signed)
Medication Instructions:  INCREASE HYDRALAZINE TO 75 MG EVERY 8 HOURS (Take 1 1/2 tablets- 3 TIMES DAILY )   Labwork: none  Testing/Procedures: none  Follow-Up: Your physician wants you to follow-up in: 6 months .  You will receive a reminder letter in the mail two months in advance. If you don't receive a letter, please call our office to schedule the follow-up appointment.  Any Other Special Instructions Will Be Listed Below (If Applicable).     If you need a refill on your cardiac medications before your next appointment, please call your pharmacy.

## 2015-11-05 NOTE — Progress Notes (Signed)
Cardiology Office Note  Date: 11/05/2015   ID: Melissa [name], DOB 1931/01/20, MRN [id number]  PCP: Fredirick Maudlin, MD  Primary Cardiologist: Nona Dell, MD   Chief Complaint  Patient presents with  . Hypertension  . Atrial Fibrillation    History of Present Illness: Melissa [name] is an 82 y.o. female that I last saw in October 2016. She had interval follow-up with Ms. Lawrence NP in February of this year. She comes in today with an Geophysicist/field seismologist from [place]. Complains of chronic weakness, appetite is fair, no exertional palpitations. Leg edema has been reasonably well controlled. Her weight is up significantly compared to last check.  She is on Demadex for management of chronic lower extremity edema. Dose has been stable, last creatinine 1.7 was also stable. We went over her medications. Recommending increasing hydralazine for better blood pressure control, although will not push Demadex for now. Echocardiogram earlier in the year showed LVEF 60-65% with evidence of severe pulmonary hypertension, likely contributing to her leg edema.  Past Medical History:  Diagnosis Date  . Atrial fibrillation (HCC)   . DDD (degenerative disc disease), lumbar   . Essential hypertension, benign   . Mixed hyperlipidemia   . Type 2 diabetes mellitus (HCC)     Current Outpatient Prescriptions  Medication Sig Dispense Refill  . atorvastatin (LIPITOR) 20 MG tablet Take 20 mg by mouth daily.    Marland Kitchen diltiazem (CARDIZEM CD) 120 MG 24 hr capsule Take 1 capsule (120 mg total) by mouth daily. 30 capsule 12  . glimepiride (AMARYL) 2 MG tablet Take 2 mg by mouth daily.      Marland Kitchen HYDROcodone-acetaminophen (NORCO/VICODIN) 5-325 MG tablet Take 1 tablet by mouth 4 (four) times daily.     Marland Kitchen lisinopril (PRINIVIL,ZESTRIL) 40 MG tablet Take 40 mg by mouth 2 (two) times daily.      . metFORMIN (GLUCOPHAGE) 500 MG tablet Take 500 mg by mouth 2 (two) times daily with meals.      . potassium chloride SA  (K-DUR,KLOR-CON) 20 MEQ tablet Take 2 tablets (40 mEq total) by mouth 2 (two) times daily. 120 tablet 5  . solifenacin (VESICARE) 5 MG tablet Take 5 mg by mouth daily.    Marland Kitchen torsemide (DEMADEX) 20 MG tablet Take 1 tablet (20 mg total) by mouth 2 (two) times daily. 60 tablet 12  . TRAVATAN Z 0.004 % SOLN ophthalmic solution Place 1 drop into both eyes at bedtime.     Marland Kitchen warfarin (COUMADIN) 5 MG tablet Take 1 tablet daily or as directed (Patient taking differently: Take 5 mg by mouth every morning. Take 1 tablet daily or as directed) 45 tablet 3  . hydrALAZINE (APRESOLINE) 50 MG tablet Take 1.5 tablets (75 mg total) by mouth 3 (three) times daily. 45 tablet 6   No current facility-administered medications for this visit.    Allergies:  Review of patient's allergies indicates no known allergies.   Social History: The patient  reports that she has never smoked. She has never used smokeless tobacco. She reports that she does not drink alcohol or use drugs.   ROS:  Please see the history of present illness. Otherwise, complete review of systems is positive for decreased hearing.  All other systems are reviewed and negative.   Physical Exam: VS:  BP (!) 183/75   Pulse 77   Ht 5\' 4"  (1.626 m)   Wt 156 lb (70.8 kg)   SpO2 97%   BMI 26.78 kg/m , BMI Body  mass index is 26.78 kg/m.  Wt Readings from Last 3 Encounters:  11/05/15 156 lb (70.8 kg)  09/28/15 144 lb (65.3 kg)  04/02/15 142 lb (64.4 kg)    Elderly woman, appears comfortable at rest. Uses a cane. HEENT: Conjunctiva and lids normal, oropharynx with moist mucosa.  Neck: Supple, no carotid bruits or thyromegaly.  Lungs: Clear with diminished breath sounds, nonlabored.  Cardiac: Irregularly irregular, no S3.  Abdomen: Soft, nontender, bowel sounds present.  Extremities: 1-2+ lower leg edema, left worse than right. Distal pulses one plus. Skin: Warm and dry.  ECG: I personally reviewed the tracing from 09/29/2015 which showed  rate-controlled atrial fibrillation with nonspecific IVCD.  Recent Labwork: 02/17/2015: B Natriuretic Peptide 982.0; Platelets 235; TSH 3.793 03/02/2015: Magnesium 1.8 09/28/2015: BUN 34; Creatinine, Ser 1.70; Hemoglobin 12.2; Potassium 5.2; Sodium 138     Component Value Date/Time   CHOL 188 05/10/2011 0930   TRIG 67 05/10/2011 0930   HDL 64 05/10/2011 0930   CHOLHDL 2.9 05/10/2011 0930   VLDL 13 05/10/2011 0930   LDLCALC 111 (H) 05/10/2011 0930    Other Studies Reviewed Today:  Echocardiogram 02/18/2015: Study Conclusions  - Left ventricle: The cavity size was normal. There was moderate   concentric hypertrophy. Systolic function was normal. The   estimated ejection fraction was in the range of 60% to 65%. The   study was not technically sufficient to allow evaluation of LV   diastolic dysfunction due to atrial fibrillation. - Ventricular septum: The contour showed diastolic flattening and   systolic flattening. These changes are consistent with RV volume   and pressure overload. - Aortic valve: Moderately calcified annulus. Trileaflet; mildly   calcified leaflets. There was no stenosis. - Mitral valve: There was mild to moderate regurgitation. - Left atrium: The atrium was severely dilated. - Right atrium: The atrium was severely dilated. Central venous   pressure (est): 8 mm Hg. - Atrial septum: The septum bowed from left to right, consistent   with increased left atrial pressure. - Tricuspid valve: There was moderate-severe regurgitation. - Pulmonic valve: There was mild to moderate regurgitation. - Pulmonary arteries: Systolic pressure was severely increased. PA   peak pressure: 75 mm Hg (S). - Systemic veins: IVC dilated with normal respiratory variation. - Pericardium, extracardiac: A trivial pericardial effusion was   identified.  Assessment and Plan:  1. Chronic atrial fibrillation, continue heart rate control with Cardizem CD. She is tolerating Coumadin for  stroke prophylaxis, followed in the anticoagulation clinic.  2. Essential hypertension, not optimally controlled. Continue regimen with increase hydralazine to 75 mg every 8 hours.  3. Chronic leg edema, likely secondary to severe pulmonary hypertension with RV strain. Continue present dose of Demadex, could use increased doses intermittently, but would not advance standing dose at this time in light of renal insufficiency.  Current medicines were reviewed with the patient today.  Disposition: Follow-up with me in 6 months.  Signed, Jonelle SidleSamuel G. McDowell, MD, [facility] Hospital[facility]ACC 11/05/2015 2:53 PM    [facility] [address], [place], [place] [address] Phone: [phone]; Fax: [phone]

## 2015-11-10 ENCOUNTER — Encounter (HOSPITAL_COMMUNITY): Payer: Self-pay | Admitting: Emergency Medicine

## 2015-11-10 ENCOUNTER — Emergency Department (HOSPITAL_COMMUNITY): Payer: Medicare Other

## 2015-11-10 ENCOUNTER — Inpatient Hospital Stay (HOSPITAL_COMMUNITY)
Admission: EM | Admit: 2015-11-10 | Discharge: 2015-11-12 | DRG: 280 | Disposition: A | Discharge status: Home Health | Payer: Medicare Other | Attending: Pulmonary Disease | Admitting: Pulmonary Disease

## 2015-11-10 DIAGNOSIS — I5033 Acute on chronic diastolic (congestive) heart failure: Secondary | ICD-10-CM | POA: Diagnosis present

## 2015-11-10 DIAGNOSIS — I160 Hypertensive urgency: Secondary | ICD-10-CM

## 2015-11-10 DIAGNOSIS — R079 Chest pain, unspecified: Secondary | ICD-10-CM | POA: Diagnosis not present

## 2015-11-10 DIAGNOSIS — I208 Other forms of angina pectoris: Secondary | ICD-10-CM

## 2015-11-10 DIAGNOSIS — Z7901 Long term (current) use of anticoagulants: Secondary | ICD-10-CM

## 2015-11-10 DIAGNOSIS — Y92230 Patient room in hospital as the place of occurrence of the external cause: Secondary | ICD-10-CM | POA: Diagnosis not present

## 2015-11-10 DIAGNOSIS — T463X5A Adverse effect of coronary vasodilators, initial encounter: Secondary | ICD-10-CM | POA: Diagnosis not present

## 2015-11-10 DIAGNOSIS — M5136 Other intervertebral disc degeneration, lumbar region: Secondary | ICD-10-CM | POA: Diagnosis present

## 2015-11-10 DIAGNOSIS — I10 Essential (primary) hypertension: Secondary | ICD-10-CM | POA: Diagnosis present

## 2015-11-10 DIAGNOSIS — Z96642 Presence of left artificial hip joint: Secondary | ICD-10-CM | POA: Diagnosis present

## 2015-11-10 DIAGNOSIS — I361 Nonrheumatic tricuspid (valve) insufficiency: Secondary | ICD-10-CM | POA: Diagnosis present

## 2015-11-10 DIAGNOSIS — I482 Chronic atrial fibrillation, unspecified: Secondary | ICD-10-CM

## 2015-11-10 DIAGNOSIS — I509 Heart failure, unspecified: Secondary | ICD-10-CM

## 2015-11-10 DIAGNOSIS — I214 Non-ST elevation (NSTEMI) myocardial infarction: Secondary | ICD-10-CM | POA: Diagnosis not present

## 2015-11-10 DIAGNOSIS — Z833 Family history of diabetes mellitus: Secondary | ICD-10-CM

## 2015-11-10 DIAGNOSIS — I161 Hypertensive emergency: Secondary | ICD-10-CM | POA: Diagnosis not present

## 2015-11-10 DIAGNOSIS — I130 Hypertensive heart and chronic kidney disease with heart failure and stage 1 through stage 4 chronic kidney disease, or unspecified chronic kidney disease: Secondary | ICD-10-CM | POA: Diagnosis present

## 2015-11-10 DIAGNOSIS — H9110 Presbycusis, unspecified ear: Secondary | ICD-10-CM | POA: Diagnosis present

## 2015-11-10 DIAGNOSIS — N3281 Overactive bladder: Secondary | ICD-10-CM | POA: Diagnosis present

## 2015-11-10 DIAGNOSIS — Z7984 Long term (current) use of oral hypoglycemic drugs: Secondary | ICD-10-CM

## 2015-11-10 DIAGNOSIS — N183 Chronic kidney disease, stage 3 (moderate): Secondary | ICD-10-CM | POA: Diagnosis present

## 2015-11-10 DIAGNOSIS — E1122 Type 2 diabetes mellitus with diabetic chronic kidney disease: Secondary | ICD-10-CM | POA: Diagnosis present

## 2015-11-10 DIAGNOSIS — Z79899 Other long term (current) drug therapy: Secondary | ICD-10-CM

## 2015-11-10 DIAGNOSIS — I5031 Acute diastolic (congestive) heart failure: Secondary | ICD-10-CM

## 2015-11-10 DIAGNOSIS — I872 Venous insufficiency (chronic) (peripheral): Secondary | ICD-10-CM | POA: Diagnosis present

## 2015-11-10 DIAGNOSIS — R51 Headache: Secondary | ICD-10-CM | POA: Diagnosis not present

## 2015-11-10 DIAGNOSIS — L602 Onychogryphosis: Secondary | ICD-10-CM | POA: Diagnosis present

## 2015-11-10 DIAGNOSIS — H409 Unspecified glaucoma: Secondary | ICD-10-CM | POA: Diagnosis present

## 2015-11-10 DIAGNOSIS — I2089 Other forms of angina pectoris: Secondary | ICD-10-CM

## 2015-11-10 DIAGNOSIS — I272 Pulmonary hypertension, unspecified: Secondary | ICD-10-CM | POA: Diagnosis present

## 2015-11-10 DIAGNOSIS — Z79891 Long term (current) use of opiate analgesic: Secondary | ICD-10-CM

## 2015-11-10 DIAGNOSIS — E782 Mixed hyperlipidemia: Secondary | ICD-10-CM | POA: Diagnosis present

## 2015-11-10 DIAGNOSIS — Z8249 Family history of ischemic heart disease and other diseases of the circulatory system: Secondary | ICD-10-CM

## 2015-11-10 DIAGNOSIS — I071 Rheumatic tricuspid insufficiency: Secondary | ICD-10-CM | POA: Diagnosis present

## 2015-11-10 LAB — CBC
HEMATOCRIT: 38.4 % (ref 36.0–46.0)
HEMOGLOBIN: 12.7 g/dL (ref 12.0–15.0)
MCH: 32.5 pg (ref 26.0–34.0)
MCHC: 33.1 g/dL (ref 30.0–36.0)
MCV: 98.2 fL (ref 78.0–100.0)
Platelets: 240 10*3/uL (ref 150–400)
RBC: 3.91 MIL/uL (ref 3.87–5.11)
RDW: 13.9 % (ref 11.5–15.5)
WBC: 5.1 10*3/uL (ref 4.0–10.5)

## 2015-11-10 LAB — BASIC METABOLIC PANEL
Anion gap: 8 (ref 5–15)
BUN: 30 mg/dL — ABNORMAL HIGH (ref 6–20)
CHLORIDE: 103 mmol/L (ref 101–111)
CO2: 27 mmol/L (ref 22–32)
Calcium: 9.5 mg/dL (ref 8.9–10.3)
Creatinine, Ser: 1.69 mg/dL — ABNORMAL HIGH (ref 0.44–1.00)
GFR, EST AFRICAN AMERICAN: 31 mL/min — AB (ref >60)
GFR, EST NON AFRICAN AMERICAN: 27 mL/min — AB (ref >60)
Glucose, Bld: 177 mg/dL — ABNORMAL HIGH (ref 65–99)
POTASSIUM: 4.5 mmol/L (ref 3.5–5.1)
SODIUM: 138 mmol/L (ref 135–145)

## 2015-11-10 LAB — TROPONIN I
TROPONIN I: 0.38 ng/mL — AB (ref <0.03)
TROPONIN I: 1.69 ng/mL — AB (ref <0.03)
Troponin I: <0.03 ng/mL (ref <0.03)

## 2015-11-10 LAB — PROTIME-INR
INR: 1.89
Prothrombin Time: 22 seconds — ABNORMAL HIGH (ref 11.4–15.2)

## 2015-11-10 MED ORDER — TORSEMIDE 20 MG PO TABS
20.0000 mg | ORAL_TABLET | Freq: Two times a day (BID) | ORAL | Status: DC
  Administered 2015-11-10: 20 mg via ORAL
  Filled 2015-11-10 (×2): qty 1

## 2015-11-10 MED ORDER — HYDRALAZINE HCL 25 MG PO TABS
75.0000 mg | ORAL_TABLET | Freq: Once | ORAL | Status: AC
  Administered 2015-11-10: 75 mg via ORAL
  Filled 2015-11-10: qty 3

## 2015-11-10 MED ORDER — LISINOPRIL 5 MG PO TABS
5.0000 mg | ORAL_TABLET | Freq: Once | ORAL | Status: AC
  Administered 2015-11-10: 5 mg via ORAL
  Filled 2015-11-10: qty 1

## 2015-11-10 MED ORDER — GI COCKTAIL ~~LOC~~: 30.0000 mL | Freq: Four times a day (QID) | ORAL | Status: DC | PRN

## 2015-11-10 MED ORDER — DILTIAZEM HCL ER COATED BEADS 120 MG PO CP24
120.0000 mg | ORAL_CAPSULE | Freq: Once | ORAL | Status: AC
  Administered 2015-11-10: 120 mg via ORAL
  Filled 2015-11-10: qty 1

## 2015-11-10 MED ORDER — HYDRALAZINE HCL 25 MG PO TABS
75.0000 mg | ORAL_TABLET | Freq: Three times a day (TID) | ORAL | Status: DC
  Administered 2015-11-10 – 2015-11-12 (×7): 75 mg via ORAL
  Filled 2015-11-10 (×6): qty 3

## 2015-11-10 MED ORDER — DILTIAZEM HCL ER COATED BEADS 120 MG PO CP24
120.0000 mg | ORAL_CAPSULE | Freq: Every day | ORAL | Status: DC
  Administered 2015-11-11 – 2015-11-12 (×2): 120 mg via ORAL
  Filled 2015-11-10 (×3): qty 1

## 2015-11-10 MED ORDER — WARFARIN - PHYSICIAN DOSING INPATIENT
Freq: Every day | Status: DC
  Administered 2015-11-10: 1

## 2015-11-10 MED ORDER — ONDANSETRON HCL 4 MG/2ML IJ SOLN: 4.0000 mg | Freq: Four times a day (QID) | INTRAMUSCULAR | Status: DC | PRN

## 2015-11-10 MED ORDER — DILTIAZEM HCL ER COATED BEADS 120 MG PO CP24: 120.0000 mg | ORAL_CAPSULE | Freq: Every day | ORAL | Status: DC

## 2015-11-10 MED ORDER — TORSEMIDE 20 MG PO TABS
20.0000 mg | ORAL_TABLET | Freq: Once | ORAL | Status: AC
  Administered 2015-11-10: 20 mg via ORAL
  Filled 2015-11-10: qty 1

## 2015-11-10 MED ORDER — ACETAMINOPHEN 325 MG PO TABS: 650.0000 mg | ORAL_TABLET | ORAL | Status: DC | PRN

## 2015-11-10 MED ORDER — LATANOPROST 0.005 % OP SOLN
1.0000 [drp] | Freq: Every day | OPHTHALMIC | Status: DC
  Administered 2015-11-10 – 2015-11-12 (×2): 1 [drp] via OPHTHALMIC
  Filled 2015-11-10: qty 2.5

## 2015-11-10 MED ORDER — NITROGLYCERIN 2 % TD OINT
0.5000 [in_us] | TOPICAL_OINTMENT | Freq: Four times a day (QID) | TRANSDERMAL | Status: DC
  Administered 2015-11-10 – 2015-11-11 (×2): 0.5 [in_us] via TOPICAL
  Filled 2015-11-10 (×3): qty 1

## 2015-11-10 MED ORDER — WARFARIN SODIUM 5 MG PO TABS
5.0000 mg | ORAL_TABLET | ORAL | Status: DC
  Administered 2015-11-10: 5 mg via ORAL
  Filled 2015-11-10: qty 1

## 2015-11-10 MED ORDER — LISINOPRIL 10 MG PO TABS
40.0000 mg | ORAL_TABLET | Freq: Once | ORAL | Status: AC
  Administered 2015-11-10: 40 mg via ORAL
  Filled 2015-11-10: qty 4

## 2015-11-10 MED ORDER — HYDROCODONE-ACETAMINOPHEN 5-325 MG PO TABS
1.0000 | ORAL_TABLET | Freq: Four times a day (QID) | ORAL | Status: DC
  Administered 2015-11-10 – 2015-11-12 (×9): 1 via ORAL
  Filled 2015-11-10 (×9): qty 1

## 2015-11-10 MED ORDER — POTASSIUM CHLORIDE CRYS ER 20 MEQ PO TBCR
40.0000 meq | EXTENDED_RELEASE_TABLET | Freq: Two times a day (BID) | ORAL | Status: DC
  Administered 2015-11-10 – 2015-11-12 (×4): 40 meq via ORAL
  Filled 2015-11-10 (×4): qty 2

## 2015-11-10 MED ORDER — DARIFENACIN HYDROBROMIDE ER 7.5 MG PO TB24
7.5000 mg | ORAL_TABLET | Freq: Every day | ORAL | Status: DC
  Administered 2015-11-10 – 2015-11-12 (×3): 7.5 mg via ORAL
  Filled 2015-11-10 (×3): qty 1

## 2015-11-10 NOTE — ED Notes (Signed)
Pt gone to xray

## 2015-11-10 NOTE — ED Notes (Signed)
Dr. Mahala MenghiniSamtani advised to "cycle" troponin due to pt's elevated level at 0.38

## 2015-11-10 NOTE — ED Notes (Signed)
Pt was given meal tray.  

## 2015-11-10 NOTE — ED Notes (Signed)
Pt sounds a little radially on exhalation. MD mad aware and examed pt.

## 2015-11-10 NOTE — ED Notes (Signed)
Pt only ate applesauce off her meal tray because she says her chest hurts when she eats.

## 2015-11-10 NOTE — Discharge Instructions (Signed)
Please remember to take your medications as prescribed. Follow up with your primary care in 1 week.

## 2015-11-10 NOTE — ED Provider Notes (Signed)
[facility]-EMERGENCY DEPT Provider Note   CSN: [id number] Arrival date & time: 11/10/15  4098     History   Chief Complaint Chief Complaint  Patient presents with  . Hypertension    HPI Melissa [name] is a 82 year old woman with history of atrial fibrillation on Coumadin, hypertension, pulmonary HTN, DM2.  HPI She is presenting with elevated BP. She was walking into bank this morning and had chest pain. The chest pain is located in the central chest region without radiation. It is an aching sensation. It resolved with rest and lasted a total of about 2-3 seconds. No associated dyspnea, nausea or lightheadedness. She notes that she was exerting herself more than usual. EMS arrived and her BP was noted to be 266/110. She did not take her hydralazine this morning since she skipped breakfast. She has chest pain with exertion at baseline and avoids walking quickly. No chest pain presently.  She is followed by Dr. Diona Browner (cardiology). Last seen on 9/28. Her hydralazine was increased to 75mg  every 8 hours.  Past Medical History:  Diagnosis Date  . Atrial fibrillation (HCC)   . DDD (degenerative disc disease), lumbar   . Essential hypertension, benign   . Mixed hyperlipidemia   . Type 2 diabetes mellitus [facility])     Patient Active Problem List   Diagnosis Date Noted  . Chest pain 11/10/2015  . Chronic venous insufficiency 02/19/2015  . CHF (congestive heart failure) (HCC) 02/17/2015  . Tricuspid valve regurgitation 02/17/2015  . Encounter for therapeutic drug monitoring 03/11/2013  . Long term current use of anticoagulant 05/05/2010  . Mixed hyperlipidemia 09/30/2009  . Essential hypertension, benign 09/24/2008  . Atrial fibrillation (HCC) 09/24/2008    Past Surgical History:  Procedure Laterality Date  . TOTAL HIP ARTHROPLASTY  01/14/03   Left     Home Medications    Prior to Admission medications   Medication Sig Start Date End Date Taking? Authorizing Provider    atorvastatin (LIPITOR) 20 MG tablet Take 20 mg by mouth daily.   Yes Historical Provider, MD  diltiazem (CARDIZEM CD) 120 MG 24 hr capsule Take 1 capsule (120 mg total) by mouth daily. 02/25/15  Yes Kari Baars, MD  glimepiride (AMARYL) 2 MG tablet Take 2 mg by mouth daily.     Yes Historical Provider, MD  hydrALAZINE (APRESOLINE) 50 MG tablet Take 1.5 tablets (75 mg total) by mouth 3 (three) times daily. Patient taking differently: Take 50 mg by mouth 3 (three) times daily.  11/05/15  Yes Jonelle Sidle, MD  HYDROcodone-acetaminophen (NORCO/VICODIN) 5-325 MG tablet Take 1 tablet by mouth 4 (four) times daily.  09/10/14  Yes Historical Provider, MD  lisinopril (PRINIVIL,ZESTRIL) 40 MG tablet Take 40 mg by mouth 2 (two) times daily.     Yes Historical Provider, MD  metFORMIN (GLUCOPHAGE) 500 MG tablet Take 500 mg by mouth 2 (two) times daily with meals.     Yes Historical Provider, MD  potassium chloride SA (K-DUR,KLOR-CON) 20 MEQ tablet Take 2 tablets (40 mEq total) by mouth 2 (two) times daily. 02/25/15  Yes Kari Baars, MD  solifenacin (VESICARE) 5 MG tablet Take 5 mg by mouth daily.   Yes Historical Provider, MD  torsemide (DEMADEX) 20 MG tablet Take 1 tablet (20 mg total) by mouth 2 (two) times daily. 02/25/15  Yes Kari Baars, MD  TRAVATAN Z 0.004 % SOLN ophthalmic solution Place 1 drop into both eyes at bedtime.  11/08/14  Yes Historical Provider, MD  warfarin (COUMADIN) 5  MG tablet Take 1 tablet daily or as directed Patient taking differently: Take 5 mg by mouth every morning. Take 1 tablet daily or as directed 06/15/15  Yes Jonelle SidleSamuel G McDowell, MD    Family History Family History  Problem Relation Age of Onset  . Diabetes type II Mother   . Diabetes type II Father   . Hypertension Father     Social History Social History  Substance Use Topics  . Smoking status: Never Smoker  . Smokeless tobacco: Never Used  . Alcohol use No     Allergies   Review of patient's allergies  indicates no known allergies.   Review of Systems Review of Systems Constitutional: no fevers/chills Eyes: no vision changes Ears, nose, mouth, throat, and face: no cough Respiratory: no shortness of breath Cardiovascular: +chest pain Gastrointestinal: no nausea/vomiting, no abdominal pain, no constipation, no diarrhea Genitourinary: no dysuria, no hematuria Integument: no rash Hematologic/lymphatic: no bleeding/bruising, no edema Musculoskeletal: no arthralgias, no myalgias Neurological: no paresthesias, no weakness   Physical Exam Updated Vital Signs BP (!) 185/115   Pulse 87   Temp 98 F (36.7 C) (Oral)   Resp 15   Ht 5\' 6"  (1.676 m)   Wt 65.3 kg   SpO2 94%   BMI 23.24 kg/m   Physical Exam General Apperance: NAD Head: Normocephalic, atraumatic Eyes: PERRL, EOMI, anicteric sclera Ears: Normal external ear canal Nose: Nares normal, septum midline, mucosa normal Throat: Lips, mucosa and tongue normal  Neck: Supple, trachea midline Back: No tenderness or bony abnormality  Lungs: Clear to auscultation bilaterally. No wheezes, rhonchi or rales. Breathing comfortably Chest Wall: Nontender, no deformity Heart: Regular rate and rhythm, no murmur/rub/gallop Abdomen: Soft, nontender, nondistended, no rebound/guarding Extremities: Normal, atraumatic, warm and well perfused, no edema Pulses: 2+ throughout Skin: No rashes or lesions Neurologic: Alert and oriented x 3. CNII-XII intact. Normal strength and sensation   ED Treatments / Results  Labs (all labs ordered are listed, but only abnormal results are displayed) Labs Reviewed  BASIC METABOLIC PANEL - Abnormal; Notable for the following:       Result Value   Glucose, Bld 177 (*)    BUN 30 (*)    Creatinine, Ser 1.69 (*)    GFR calc non Af Amer 27 (*)    GFR calc Af Amer 31 (*)    All other components within normal limits  CBC  TROPONIN I  TROPONIN I    EKG  EKG Interpretation  Date/Time:  Tuesday November 10 2015 09:56:07 EDT Ventricular Rate:  79 PR Interval:    QRS Duration: 104 QT Interval:  388 QTC Calculation: 445 R Axis:   8 Text Interpretation:  Atrial fibrillation Probable LVH with secondary repol abnrm Similar except T waves more prominent V2-3 Confirmed by Jodi MourningZAVITZ MD, JOSHUA [id number]) on 11/10/2015 10:03:35 AM       Radiology Dg Chest 2 View  Result Date: 11/10/2015 CLINICAL DATA:  Chest pain and hypertension while walking. EXAM: CHEST  2 VIEW COMPARISON:  03/02/2015 FINDINGS: Cardiomegaly. Aortic atherosclerosis. Interstitial and early alveolar edema. No consolidation or collapse. No effusions. No acute bone finding. IMPRESSION: Cardiomegaly.  Acute interstitial and alveolar edema. Electronically Signed   By: Paulina FusiMark  Shogry M.D.   On: 11/10/2015 10:57    Procedures Procedures  None  Medications Ordered in ED Medications  hydrALAZINE (APRESOLINE) tablet 75 mg (75 mg Oral Given 11/10/15 1024)  diltiazem (CARDIZEM CD) 24 hr capsule 120 mg (120 mg Oral Given 11/10/15 1317)  lisinopril (PRINIVIL,ZESTRIL) tablet 40 mg (40 mg Oral Given 11/10/15 1316)  torsemide (DEMADEX) tablet 20 mg (20 mg Oral Given 11/10/15 1317)     Initial Impression / Assessment and Plan / ED Course  I have reviewed the triage vital signs and the nursing notes.  Pertinent labs & imaging results that were available during my care of the patient were reviewed by me and considered in my medical decision making (see chart for details).  Clinical Course   10:20AM Home hydralazine ordered 11:30AM BP improved down to 160s systolic. No recurrence of chest pain 12:50PM Patient had recurrence of chest pain. BP in the 180s systolic.  Final Clinical Impressions(s) / ED Diagnoses   Final diagnoses:  Hypertensive urgency   BP here 203/105. EKG with peaked t waves in V2-3. Troponin negative. Creatinine at baseline of 1.7 and BMP otherwise unremarkable. CBC unremarkable. CXR with cardiomegaly and acute interstitial and  alveolar edema. She was given her home hydralazine dose of 75mg  with improvement of her BP down to 160s systolic. Recurrence of chest pain while at rest in bed. Given EKG changes and risk factors, will have her admitted for observation.  Discussed with [facility]. Will also consult cardiology.  New Prescriptions New Prescriptions   No medications on file     Lora Paula, MD 11/10/15 1344    Blane Ohara, MD 11/10/15 1406

## 2015-11-10 NOTE — Consult Note (Addendum)
Primary cardiologist: Dr Simona Huh Consulting cardiologist: Dr Dina Rich Requesting physician: Dr Pleas Koch Indication: elevated troponin, HTN emergency  Clinical Summary Melissa [name] is a 82 y.o.female with history of HTN, chronic LE edema, pulmonary HTN, DM2, chronic afib admitted with chest pain. Episode occurred while walking to the bank this morning. Severe pain throughout the chest, she is not able to describe the character of the pain. Lasted for about 30-40 minutes. Per ER notes, EMS evaluation SBP in mid 200s. In ER SBP in low 200s. Patient had not taken her home bp meds this morning, given her home regimen in ER with bp tending down and resolution of symptoms.    Jan  2017 echo: LVEF 60-65%, cannot eval diastolic fxn due to afib, mod MR, severe LAE, severe RAE, mod to severe TR, PASP 75. RV described as normal size and function, but with flattened septum indicating elevated RV pressure and volume  ER vitals: bp 203/105 p 82 95% RA RR 18 Cr 1.69 (baseline 1.7), K 4.5, GFR 27, Hgb 12.7, Plt 240,  Trop neg-->0.38 EKG afib, chronic lateral nonspecific ST/T changes CXR cardiomegaly, +pulmonary edema.      No Known Allergies  Medications Scheduled Medications:    Infusions:    PRN Medications:     Past Medical History:  Diagnosis Date  . Atrial fibrillation (HCC)   . DDD (degenerative disc disease), lumbar   . Essential hypertension, benign   . Mixed hyperlipidemia   . Type 2 diabetes mellitus (HCC)     Past Surgical History:  Procedure Laterality Date  . TOTAL HIP ARTHROPLASTY  01/14/03   Left    Family History  Problem Relation Age of Onset  . Diabetes type II Mother   . Diabetes type II Father   . Hypertension Father     Social History Ms. Martos reports that she has never smoked. She has never used smokeless tobacco. Ms. Rief reports that she does not drink alcohol.  Review of Systems CONSTITUTIONAL: No weight loss, fever,  chills, weakness or fatigue.  HEENT: Eyes: No visual loss, blurred vision, double vision or yellow sclerae. No hearing loss, sneezing, congestion, runny nose or sore throat.  SKIN: No rash or itching.  CARDIOVASCULAR: per HPI RESPIRATORY: No shortness of breath, cough or sputum.  GASTROINTESTINAL: No anorexia, nausea, vomiting or diarrhea. No abdominal pain or blood.  GENITOURINARY: no polyuria, no dysuria NEUROLOGICAL: No headache, dizziness, syncope, paralysis, ataxia, numbness or tingling in the extremities. No change in bowel or bladder control.  MUSCULOSKELETAL: No muscle, back pain, joint pain or stiffness.  HEMATOLOGIC: No anemia, bleeding or bruising.  LYMPHATICS: No enlarged nodes. No history of splenectomy.  PSYCHIATRIC: No history of depression or anxiety.      Physical Examination Blood pressure (!) 177/107, pulse 83, temperature 98 F (36.7 C), temperature source Oral, resp. rate 22, height 5\' 6"  (1.676 m), weight 144 lb (65.3 kg), SpO2 92 %. No intake or output data in the 24 hours ending 11/10/15 1534  HEENT: sclera clear, throat clear  Cardiovascular: irreg, 2/6 systolic murmur apex, JVD just below angle of jaw  Respiratory: faint crackles bilateral bases  GI: abdomen soft, NT, ND  MSK: no LE edema  Neuro: no focal deficits  Psych: appropriate affect   Lab Results  Basic Metabolic Panel:  Recent Labs Lab 11/10/15 1023  NA 138  K 4.5  CL 103  CO2 27  GLUCOSE 177*  BUN 30*  CREATININE 1.69*  CALCIUM 9.5  Liver Function Tests: No results for input(s): AST, ALT, ALKPHOS, BILITOT, PROT, ALBUMIN in the last 168 hours.  CBC:  Recent Labs Lab 11/10/15 1023  WBC 5.1  HGB 12.7  HCT 38.4  MCV 98.2  PLT 240    Cardiac Enzymes:  Recent Labs Lab 11/10/15 1023 11/10/15 1342  TROPONINI <0.03 0.38*    BNP: Invalid input(s): POCBNP    Impression/Recommendations  1. HTN emergency - SBP in 200s on admission, elevated troponin with  evidence of pulmonary edema. Reportedly by EMS SBP 260. Patient did not take her meds this morning.  - in Er received dilt 120mg , hydralazine 75mg , lisinopril 40mg , torsemide 20mg .  - at time of my interview she had already received her home bp meds in ER, SBP trended down to 160s with resolution of symptoms - admit overnight for continued bp control, cycle cardiac enzymes. At this time suspect demand ischemia due to severe HTN as opposed to acute obstructive CAD - increase hydralazine to 100mg  tid. Will place NG patch overnight in setting of CP and pulm edema.  If additional bp control needed room to titrate dilt if heart rates would tolerate. Other options include changing diltiazem to labetolol for rate control and increased bp control, or changing to beta blockerf for rate control and dihydropyrinde CCB for bp.  - follow volume status with improved afterload reduction, she received her home torsemide in the ER.  - repeat echo in AM. Make NPO at midnight in case stress indicated. No role for heparin at this time.    2. Afib - rate controlled, continue diltiazem. She is on coumadin for stroke prevention, CHADS2Vasc score is 5. - continue current meds     Dina RichJonathan Braelynn [name], M.D.

## 2015-11-10 NOTE — ED Notes (Signed)
CRITICAL VALUE ALERT  Critical value received: Troponin 0.38  Date of notification:  11/10/15  Time of notification:  1451  Critical value read back:Yes.    Nurse who received alert: Docia BarrierMeagan Mitchell, RN  MD notified (1st page):  Samtani  Time of first page: 1451  MD notified (2nd page):  Time of second page:  Responding MD:  Mahala MenghiniSamtani  Time MD responded:  [id number]

## 2015-11-10 NOTE — ED Notes (Signed)
Admitting doctor at bedside 

## 2015-11-10 NOTE — ED Triage Notes (Signed)
Per EMS: Pt was walking into bank this morning and started having cp in center of chest without radiation.  BP was elevated 266/110.  Pt in no pain at this time.  Pt alert and oriented.

## 2015-11-10 NOTE — Progress Notes (Signed)
CRITICAL VALUE ALERT  Critical value received:  Trop 1.69  Date of notification:  11/10/15  Time of notification:  2210  Critical value read back:Yes.    Nurse who received alert:  Burnadette PopLisa Celeste Candelas  Responding MD:  opyd  Time MD responded: 2240  Ekg ordered

## 2015-11-10 NOTE — H&P (Signed)
[facility] History and Physical  Melissa [name] QQP:[id number] DOB: Oct 19, 1930 DOA: 11/10/2015  Referring physician: Dr. Reather Converse PCP: Alonza Bogus, MD  Specialists: Cardiology  Chief Complaint: Chest pain  HPI:  81 year old female Known history of atrial fibrillation [place] score >5 on anticoagulation with Coumadin Severe pulmonary hypertension based on echo 02/18/15 on diuretics with torsemide-diuresis somewhat limited by renal insufficiency as per pulmonology Diabetes mellitus type II currently on oral medication Hyperlipidemia  High functioning at baseline uses a cane and occasionally a walker still takes care of her own affairs states she was walking to the bank and was walking quickly and felt some shortness of breath and chest pain Chest pain is common for her with walking quickly and at her visit with cardiology recently on 10/28/2015 she told Dr. Domenic Polite about these issues. It has been felt in the past that her pain is noncardiac She came to the emergency room and as she had not taken her usual medications was found to have elevated blood pressure in the 712 range systolic.  Usually takes an oxycodone for pain in her chest and walks with this when she goes outside the home Has not had any specific nausea, vomiting, blurred vision, double vision, bruising, dark stool, tarry stool, fall, weakness, cough, cold, dysuria, melena  She states that when she has her chest pain usually lasts only a couple of seconds-emergency room was about to discharge her and then noted that she had recurrence of chest and while at rest and was without reproduction or without any aggravating factors such as diet she states that the pain occasionally goes down the right arm but not any general. Or pain on the left arm no radiation of the pain anywhere else  Emergency room EKG showed ST-T wave elevations across V2 3 and 4 which are relatively new despite her rate controlled atrial fibrillation and  these are not felt the rate related changes but could represent secondary to oversedation QRS axis 20  Point-of-care troponin was negative Chest x-ray showed alveolar and interstitial edema  BUN/creatinine that same level as it usually is CBC within normal limits Emergency room provided her dictation hydralazine and added on all by mouth medications   Review of Systems: The patient denies anorexia, fever, weight loss,, vision loss, decreased hearing, hoarseness, chest pain, syncope, dyspnea on exertion, peripheral edema, balance deficits, hemoptysis, abdominal pain, melena, hematochezia, severe indigestion/heartburn, hematuria, incontinence, genital sores, muscle weakness, suspicious skin lesions, transient blindness, difficulty walking, depression, unusual weight change, abnormal bleeding, enlarged lymph nodes, angioedema, and breast masses.    Past Medical History:  Diagnosis Date  . Atrial fibrillation ([place])   . DDD (degenerative disc disease), lumbar   . Essential hypertension, benign   . Mixed hyperlipidemia   . Type 2 diabetes mellitus ([place])    Past Surgical History:  Procedure Laterality Date  . TOTAL HIP ARTHROPLASTY  01/14/03   Left   Social History:  Social History   Social History Narrative  . No narrative on file    No Known Allergies  Family History  Problem Relation Age of Onset  . Diabetes type II Mother   . Diabetes type II Father   . Hypertension Father     Prior to Admission medications   Medication Sig Start Date End Date Taking? Authorizing Provider  atorvastatin (LIPITOR) 20 MG tablet Take 20 mg by mouth daily.   Yes Historical Provider, MD  diltiazem (CARDIZEM CD) 120 MG 24 hr capsule Take 1 capsule (120 mg  total) by mouth daily. 02/25/15  Yes Edward Hawkins, MD  glimepiride (AMARYL) 2 MG tablet Take 2 mg by mouth daily.     Yes Historical Provider, MD  hydrALAZINE (APRESOLINE) 50 MG tablet Take 1.5 tablets (75 mg total) by mouth 3 (three) times  daily. Patient taking differently: Take 50 mg by mouth 3 (three) times daily.  11/05/15  Yes Samuel G McDowell, MD  HYDROcodone-acetaminophen (NORCO/VICODIN) 5-325 MG tablet Take 1 tablet by mouth 4 (four) times daily.  09/10/14  Yes Historical Provider, MD  lisinopril (PRINIVIL,ZESTRIL) 40 MG tablet Take 40 mg by mouth 2 (two) times daily.     Yes Historical Provider, MD  metFORMIN (GLUCOPHAGE) 500 MG tablet Take 500 mg by mouth 2 (two) times daily with meals.     Yes Historical Provider, MD  potassium chloride SA (K-DUR,KLOR-CON) 20 MEQ tablet Take 2 tablets (40 mEq total) by mouth 2 (two) times daily. 02/25/15  Yes Edward Hawkins, MD  solifenacin (VESICARE) 5 MG tablet Take 5 mg by mouth daily.   Yes Historical Provider, MD  torsemide (DEMADEX) 20 MG tablet Take 1 tablet (20 mg total) by mouth 2 (two) times daily. 02/25/15  Yes Edward Hawkins, MD  TRAVATAN Z 0.004 % SOLN ophthalmic solution Place 1 drop into both eyes at bedtime.  11/08/14  Yes Historical Provider, MD  warfarin (COUMADIN) 5 MG tablet Take 1 tablet daily or as directed Patient taking differently: Take 5 mg by mouth every morning. Take 1 tablet daily or as directed 06/15/15  Yes Samuel G McDowell, MD   Physical Exam: Vitals:   11/10/15 1015 11/10/15 1024 11/10/15 1112 11/10/15 1316  BP:  (!) 203/105 (!) 196/110 (!) 185/115  Pulse: 106  87   Resp: 16  15   Temp:      TempSrc:      SpO2: 94%     Weight:      Height:        Alert frail oriented pleasant very hard of hearing mild pallor no icterus Poor dentition JVD elevated to general S1-S2 holosystolic murmur Crackles and rales in posterior right lung fields at the bases  abdomen soft nontender nondistended no rebound no guarding No lower extremity edema no rash onychogryphosis noted however Neurologically intact in all 4 limbs equally without deficit but hard of her  Labs on Admission:  Basic Metabolic Panel:  Recent Labs Lab 11/10/15 1023  NA 138  K 4.5  CL 103   CO2 27  GLUCOSE 177*  BUN 30*  CREATININE 1.69*  CALCIUM 9.5   Liver Function Tests: No results for input(s): AST, ALT, ALKPHOS, BILITOT, PROT, ALBUMIN in the last 168 hours. No results for input(s): LIPASE, AMYLASE in the last 168 hours. No results for input(s): AMMONIA in the last 168 hours. CBC:  Recent Labs Lab 11/10/15 1023  WBC 5.1  HGB 12.7  HCT 38.4  MCV 98.2  PLT 240   Cardiac Enzymes:  Recent Labs Lab 11/10/15 1023  TROPONINI <0.03    BNP (last 3 results)  Recent Labs  02/17/15 1759  BNP 982.0*    ProBNP (last 3 results) No results for input(s): PROBNP in the last 8760 hours.  CBG: No results for input(s): GLUCAP in the last 168 hours.  Radiological Exams on Admission: Dg Chest 2 View  Result Date: 11/10/2015 CLINICAL DATA:  Chest pain and hypertension while walking. EXAM: CHEST  2 VIEW COMPARISON:  03/02/2015 FINDINGS: Cardiomegaly. Aortic atherosclerosis. Interstitial and early alveolar edema. No consolidation   or collapse. No effusions. No acute bone finding. IMPRESSION: Cardiomegaly.  Acute interstitial and alveolar edema. Electronically Signed   By: Mark  Shogry M.D.   On: 11/10/2015 10:57    EKG: Independently reviewed. See above  Assessment/Plan    Angina at rest (HCC) Heart score ~ 4 EKG changes most likely secondary to LVH Patient will need chest pain rule out that because of her symptoms being a little unusual and the fact that she's never had an ischemic workup may benefit from cardiology input and may benefit from stress testing Needs morning EKG as well as troponins cycled  AECHF/Acute CHF (congestive heart failure) (HCC)She does seem to have significant JVD and may need slightly higher dose of Demadex although this may be limited by her renal insuff She was given a dose of torsemide 20 mg in the ED and she will continue her home dose 20 mg twice a day this evening Repeat labs in the morning Might benefit from low-dose metolazone  although watch sodium    Essential hypertension, benign She is on an ACE inhibitor which has cut act 5 mg daily lisinopril from 40 mg Continue hydralazine 75 3 times a day  may benefit from addition of chronic nitrate such as Isordil Continue Cardizem CD 120 daily    Tricuspid valve regurgitation, severe pulmonary hypertension See above discussion Might benefit from chronic oxygen   DM ty II D/c Metformin on d/c as high risk met acidosis May continue glipizide as OP SSI here in hospital  HLD HOld statin for right now  PResbycusis  Onychogryphosis Needs Podiatry input as OP   70 minutes  Discussed with daughter at bedside Patient is full code status.   , JAI-GURMUKH [facility] Pager [phone]  If 7PM-7AM, please contact night-coverage [email or web address] Password [email or web address] 11/10/2015, 1:29 PM       

## 2015-11-10 NOTE — Progress Notes (Signed)
Asked RN to call abnormal troponin to cardiology since they are already consulting for same.  KJKG, NP [facility]

## 2015-11-10 NOTE — ED Notes (Signed)
Attempted to call report. Nurse did not answer. I will call back again soon.

## 2015-11-10 NOTE — ED Notes (Signed)
Two failed IV attempts.  

## 2015-11-10 NOTE — ED Notes (Signed)
Pt ambulated without difficulty to the restroom and back.

## 2015-11-10 NOTE — ED Notes (Signed)
Attempted to call report but bed was not verified by charge nurse yet. Told to call back in 10 minutes.

## 2015-11-10 NOTE — ED Notes (Signed)
Pt given meal per Dr. Jodi MourningZavitz.

## 2015-11-11 ENCOUNTER — Observation Stay (HOSPITAL_BASED_OUTPATIENT_CLINIC_OR_DEPARTMENT_OTHER): Payer: Medicare Other

## 2015-11-11 DIAGNOSIS — I214 Non-ST elevation (NSTEMI) myocardial infarction: Principal | ICD-10-CM

## 2015-11-11 DIAGNOSIS — R079 Chest pain, unspecified: Secondary | ICD-10-CM

## 2015-11-11 DIAGNOSIS — Y92230 Patient room in hospital as the place of occurrence of the external cause: Secondary | ICD-10-CM | POA: Diagnosis not present

## 2015-11-11 DIAGNOSIS — I130 Hypertensive heart and chronic kidney disease with heart failure and stage 1 through stage 4 chronic kidney disease, or unspecified chronic kidney disease: Secondary | ICD-10-CM | POA: Diagnosis present

## 2015-11-11 DIAGNOSIS — I872 Venous insufficiency (chronic) (peripheral): Secondary | ICD-10-CM | POA: Diagnosis present

## 2015-11-11 DIAGNOSIS — Z7901 Long term (current) use of anticoagulants: Secondary | ICD-10-CM | POA: Diagnosis not present

## 2015-11-11 DIAGNOSIS — I482 Chronic atrial fibrillation: Secondary | ICD-10-CM

## 2015-11-11 DIAGNOSIS — N3281 Overactive bladder: Secondary | ICD-10-CM | POA: Diagnosis present

## 2015-11-11 DIAGNOSIS — R51 Headache: Secondary | ICD-10-CM | POA: Diagnosis not present

## 2015-11-11 DIAGNOSIS — I361 Nonrheumatic tricuspid (valve) insufficiency: Secondary | ICD-10-CM | POA: Diagnosis present

## 2015-11-11 DIAGNOSIS — E1122 Type 2 diabetes mellitus with diabetic chronic kidney disease: Secondary | ICD-10-CM | POA: Diagnosis present

## 2015-11-11 DIAGNOSIS — I161 Hypertensive emergency: Secondary | ICD-10-CM | POA: Diagnosis not present

## 2015-11-11 DIAGNOSIS — M5136 Other intervertebral disc degeneration, lumbar region: Secondary | ICD-10-CM | POA: Diagnosis present

## 2015-11-11 DIAGNOSIS — Z833 Family history of diabetes mellitus: Secondary | ICD-10-CM | POA: Diagnosis not present

## 2015-11-11 DIAGNOSIS — L602 Onychogryphosis: Secondary | ICD-10-CM | POA: Diagnosis present

## 2015-11-11 DIAGNOSIS — Z79899 Other long term (current) drug therapy: Secondary | ICD-10-CM | POA: Diagnosis not present

## 2015-11-11 DIAGNOSIS — E782 Mixed hyperlipidemia: Secondary | ICD-10-CM | POA: Diagnosis present

## 2015-11-11 DIAGNOSIS — N183 Chronic kidney disease, stage 3 (moderate): Secondary | ICD-10-CM | POA: Diagnosis present

## 2015-11-11 DIAGNOSIS — T463X5A Adverse effect of coronary vasodilators, initial encounter: Secondary | ICD-10-CM | POA: Diagnosis not present

## 2015-11-11 DIAGNOSIS — Z79891 Long term (current) use of opiate analgesic: Secondary | ICD-10-CM | POA: Diagnosis not present

## 2015-11-11 DIAGNOSIS — Z7984 Long term (current) use of oral hypoglycemic drugs: Secondary | ICD-10-CM | POA: Diagnosis not present

## 2015-11-11 DIAGNOSIS — H9110 Presbycusis, unspecified ear: Secondary | ICD-10-CM | POA: Diagnosis present

## 2015-11-11 DIAGNOSIS — Z96642 Presence of left artificial hip joint: Secondary | ICD-10-CM | POA: Diagnosis present

## 2015-11-11 DIAGNOSIS — I272 Pulmonary hypertension, unspecified: Secondary | ICD-10-CM | POA: Diagnosis present

## 2015-11-11 DIAGNOSIS — H409 Unspecified glaucoma: Secondary | ICD-10-CM | POA: Diagnosis present

## 2015-11-11 DIAGNOSIS — I5033 Acute on chronic diastolic (congestive) heart failure: Secondary | ICD-10-CM | POA: Diagnosis present

## 2015-11-11 LAB — ECHOCARDIOGRAM COMPLETE
AVLVOTPG: 5 mmHg
CHL CUP DOP CALC LVOT VTI: 22.4 cm
CHL CUP MV DEC (S): 155
CHL CUP RV SYS PRESS: 84 mmHg
CHL CUP TV REG PEAK VELOCITY: 437 cm/s
EERAT: 12.15
EWDT: 155 ms
FS: 50 % — AB (ref 28–44)
HEIGHTINCHES: 65 in
IV/PV OW: 1.19
LA ID, A-P, ES: 46 mm
LA diam end sys: 46 mm
LA diam index: 2.65 cm/m2
LA vol A4C: 142 ml
LAVOL: 133 mL
LAVOLIN: 76.5 mL/m2
LDCA: 2.27 cm2
LV E/e' medial: 12.15
LV dias vol: 59 mL (ref 46–106)
LV e' LATERAL: 6.85 cm/s
LVDIAVOLIN: 34 mL/m2
LVEEAVG: 12.15
LVOT SV: 51 mL
LVOTD: 17 mm
LVOTPV: 111 cm/s
Lateral S' vel: 11.2 cm/s
MV Peak grad: 3 mmHg
MV pk E vel: 83.2 m/s
MVPKAVEL: 23.3 m/s
PW: 12.1 mm — AB (ref 0.6–1.1)
TAPSE: 12 mm
TDI e' lateral: 6.85
TDI e' medial: 5.87
TR max vel: 437 cm/s
WEIGHTICAEL: 2303.37 [oz_av]

## 2015-11-11 LAB — BASIC METABOLIC PANEL
ANION GAP: 7 (ref 5–15)
BUN: 27 mg/dL — AB (ref 6–20)
CHLORIDE: 104 mmol/L (ref 101–111)
CO2: 25 mmol/L (ref 22–32)
Calcium: 9.3 mg/dL (ref 8.9–10.3)
Creatinine, Ser: 1.52 mg/dL — ABNORMAL HIGH (ref 0.44–1.00)
GFR calc Af Amer: 36 mL/min — ABNORMAL LOW (ref >60)
GFR, EST NON AFRICAN AMERICAN: 31 mL/min — AB (ref >60)
GLUCOSE: 119 mg/dL — AB (ref 65–99)
POTASSIUM: 4.6 mmol/L (ref 3.5–5.1)
Sodium: 136 mmol/L (ref 135–145)

## 2015-11-11 LAB — PROTIME-INR
INR: 2.26
Prothrombin Time: 25.4 seconds — ABNORMAL HIGH (ref 11.4–15.2)

## 2015-11-11 LAB — TROPONIN I: TROPONIN I: 1.89 ng/mL — AB (ref <0.03)

## 2015-11-11 MED ORDER — HEPARIN (PORCINE) IN NACL 100-0.45 UNIT/ML-% IJ SOLN
900.0000 [IU]/h | INTRAMUSCULAR | Status: DC
  Administered 2015-11-11: 900 [IU]/h via INTRAVENOUS
  Filled 2015-11-11: qty 250

## 2015-11-11 MED ORDER — WARFARIN SODIUM 5 MG PO TABS
5.0000 mg | ORAL_TABLET | Freq: Once | ORAL | Status: AC
Start: 2015-11-11 — End: 2015-11-11
  Administered 2015-11-11: 5 mg via ORAL
  Filled 2015-11-11: qty 1

## 2015-11-11 MED ORDER — WARFARIN - PHARMACIST DOSING INPATIENT
Freq: Every day | Status: DC
  Administered 2015-11-11: 19:00:00

## 2015-11-11 MED ORDER — FUROSEMIDE 10 MG/ML IJ SOLN
40.0000 mg | Freq: Two times a day (BID) | INTRAMUSCULAR | Status: DC
Start: 2015-11-11 — End: 2015-11-12
  Administered 2015-11-11 (×2): 40 mg via INTRAVENOUS
  Filled 2015-11-11 (×3): qty 4

## 2015-11-11 MED ORDER — ORAL CARE MOUTH RINSE
15.0000 mL | Freq: Two times a day (BID) | OROMUCOSAL | Status: DC
  Administered 2015-11-11 – 2015-11-12 (×2): 15 mL via OROMUCOSAL

## 2015-11-11 MED ORDER — ISOSORBIDE MONONITRATE ER 60 MG PO TB24
30.0000 mg | ORAL_TABLET | Freq: Every day | ORAL | Status: DC
  Administered 2015-11-11 – 2015-11-12 (×2): 30 mg via ORAL
  Filled 2015-11-11 (×2): qty 1

## 2015-11-11 NOTE — Progress Notes (Signed)
ANTICOAGULATION CONSULT NOTE - Initial Consult  Pharmacy Consult for Coumadin Indication: atrial fibrillation  No Known Allergies  Patient Measurements: Height: 5\' 5"  (165.1 cm) Weight: 143 lb 15.4 oz (65.3 kg) IBW/kg (Calculated) : 57  Vital Signs: Temp: 98.3 F (36.8 C) (10/04 0600) Temp Source: Oral (10/04 0600) BP: 150/82 (10/04 0600) Pulse Rate: 66 (10/04 0600)  Labs:  Recent Labs  11/10/15 1023 11/10/15 1342 11/10/15 1639 11/10/15 2036 11/11/15 0118 11/11/15 0749  HGB 12.7  --   --   --   --   --   HCT 38.4  --   --   --   --   --   PLT 240  --   --   --   --   --   LABPROT  --   --  22.0*  --   --  25.4*  INR  --   --  1.89  --   --  2.26  CREATININE 1.69*  --   --   --   --  1.52*  TROPONINI <0.03 0.38*  --  1.69* 1.89*  --     Estimated Creatinine Clearance: 25.7 mL/min (by C-G formula based on SCr of 1.52 mg/dL (H)).   Medical History: Past Medical History:  Diagnosis Date  . Atrial fibrillation (HCC)   . DDD (degenerative disc disease), lumbar   . Essential hypertension, benign   . Mixed hyperlipidemia   . Type 2 diabetes mellitus (HCC)     Medications:  Prescriptions Prior to Admission  Medication Sig Dispense Refill Last Dose  . atorvastatin (LIPITOR) 20 MG tablet Take 20 mg by mouth daily.   11/09/2015 at Unknown time  . diltiazem (CARDIZEM CD) 120 MG 24 hr capsule Take 1 capsule (120 mg total) by mouth daily. 30 capsule 12 11/09/2015 at Unknown time  . glimepiride (AMARYL) 2 MG tablet Take 2 mg by mouth daily.     11/09/2015 at Unknown time  . hydrALAZINE (APRESOLINE) 50 MG tablet Take 1.5 tablets (75 mg total) by mouth 3 (three) times daily. (Patient taking differently: Take 50 mg by mouth 3 (three) times daily. ) 45 tablet 6 11/09/2015 at Unknown time  . HYDROcodone-acetaminophen (NORCO/VICODIN) 5-325 MG tablet Take 1 tablet by mouth 4 (four) times daily.    11/10/2015 at Unknown time  . lisinopril (PRINIVIL,ZESTRIL) 40 MG tablet Take 40 mg by  mouth 2 (two) times daily.     11/09/2015 at Unknown time  . metFORMIN (GLUCOPHAGE) 500 MG tablet Take 500 mg by mouth 2 (two) times daily with meals.     11/09/2015 at Unknown time  . potassium chloride SA (K-DUR,KLOR-CON) 20 MEQ tablet Take 2 tablets (40 mEq total) by mouth 2 (two) times daily. 120 tablet 5 11/09/2015 at Unknown time  . solifenacin (VESICARE) 5 MG tablet Take 5 mg by mouth daily.   11/09/2015 at Unknown time  . torsemide (DEMADEX) 20 MG tablet Take 1 tablet (20 mg total) by mouth 2 (two) times daily. 60 tablet 12 11/09/2015 at Unknown time  . TRAVATAN Z 0.004 % SOLN ophthalmic solution Place 1 drop into both eyes at bedtime.    11/09/2015 at Unknown time  . warfarin (COUMADIN) 5 MG tablet Take 1 tablet daily or as directed (Patient taking differently: Take 5 mg by mouth every morning. Take 1 tablet daily or as directed) 45 tablet 3 11/09/2015 at 0800    Assessment: 82 yo female admitted with elevated troponins and hypertensive emergency and admitted with  chest pain..She has history of HTN, chronic LE edema, pulmonary HTN, DM2, chronic afib. Patient to continue with coumadin. INR this AM is therapeutic= 2.26. Per anticoag clinic, patient is on Coumadin 5mg  daily except 2.5mg  on Sunday and Wednesday. INR on admission 1.86 but therapeutic this AM.    Goal of Therapy:  INR 2-3 Monitor platelets by anticoagulation protocol: Yes   Plan:  Coumadin 5mg  today x 1 PT/INR daily Monitor for S/S of bleeding  Elder Cyphers, BS Loura Back, BCPS Clinical Pharmacist Pager [phone]  11/11/2015,11:04 AM

## 2015-11-11 NOTE — Progress Notes (Signed)
Subjective: She says she feels better. She has no new complaints. She denies any chest pain but says she has a headache that may be related to her nitroglycerin patch. Her blood pressure is better  Objective: Vital signs in last 24 hours: Temp:  [98 F (36.7 C)-98.4 F (36.9 C)] 98.3 F (36.8 C) (10/04 0600) Pulse Rate:  [58-106] 66 (10/04 0600) Resp:  [15-22] 16 (10/04 0600) BP: (129-203)/(72-115) 150/82 (10/04 0600) SpO2:  [91 %-100 %] 99 % (10/04 0600) Weight:  [65.3 kg (143 lb 15.4 oz)-65.3 kg (144 lb)] 65.3 kg (143 lb 15.4 oz) (10/03 1630) Weight change:  Last BM Date: 11/10/15  Intake/Output from previous day: 10/03 0701 - 10/04 0700 In: 180 [P.O.:180] Out: -   PHYSICAL EXAM General appearance: alert, cooperative, no distress and Very hard of hearing Resp: She still has rales bilaterally Cardio: Her heart is irregular with a 3/6 systolic murmur GI: soft, non-tender; bowel sounds normal; no masses,  no organomegaly Extremities: Trace edema  Lab Results:  Results for orders placed or performed during the hospital encounter of 11/10/15 (from the past 48 hour(s))  Basic metabolic panel     Status: Abnormal   Collection Time: 11/10/15 10:23 AM  Result Value Ref Range   Sodium 138 135 - 145 mmol/L   Potassium 4.5 3.5 - 5.1 mmol/L   Chloride 103 101 - 111 mmol/L   CO2 27 22 - 32 mmol/L   Glucose, Bld 177 (H) 65 - 99 mg/dL   BUN 30 (H) 6 - 20 mg/dL   Creatinine, Ser 1.69 (H) 0.44 - 1.00 mg/dL   Calcium 9.5 8.9 - 10.3 mg/dL   GFR calc non Af Amer 27 (L) >60 mL/min   GFR calc Af Amer 31 (L) >60 mL/min    Comment: (NOTE) The eGFR has been calculated using the CKD EPI equation. This calculation has not been validated in all clinical situations. eGFR's persistently <60 mL/min signify possible Chronic Kidney Disease.    Anion gap 8 5 - 15  CBC     Status: None   Collection Time: 11/10/15 10:23 AM  Result Value Ref Range   WBC 5.1 4.0 - 10.5 K/uL   RBC 3.91 3.87 - 5.11  MIL/uL   Hemoglobin 12.7 12.0 - 15.0 g/dL   HCT 38.4 36.0 - 46.0 %   MCV 98.2 78.0 - 100.0 fL   MCH 32.5 26.0 - 34.0 pg   MCHC 33.1 30.0 - 36.0 g/dL   RDW 13.9 11.5 - 15.5 %   Platelets 240 150 - 400 K/uL  Troponin I     Status: None   Collection Time: 11/10/15 10:23 AM  Result Value Ref Range   Troponin I <0.03 <0.03 ng/mL  Troponin I     Status: Abnormal   Collection Time: 11/10/15  1:42 PM  Result Value Ref Range   Troponin I 0.38 (HH) <0.03 ng/mL    Comment: DELTA CHECK NOTED CRITICAL RESULT CALLED TO, READ BACK BY AND VERIFIED WITH: ROBERSON,L AT 1450 ON 11/10/2015 BY ISLEY,B   Protime-INR     Status: Abnormal   Collection Time: 11/10/15  4:39 PM  Result Value Ref Range   Prothrombin Time 22.0 (H) 11.4 - 15.2 seconds   INR 1.89   Troponin I (q 6hr x 3)     Status: Abnormal   Collection Time: 11/10/15  8:36 PM  Result Value Ref Range   Troponin I 1.69 (HH) <0.03 ng/mL    Comment: CRITICAL RESULT CALLED  TO, READ BACK BY AND VERIFIED WITH: HURTEAU,L ON 11/10/15 AT 2210 BY LOY,C   Troponin I (q 6hr x 3)     Status: Abnormal   Collection Time: 11/11/15  1:18 AM  Result Value Ref Range   Troponin I 1.89 (HH) <0.03 ng/mL    Comment: CRITICAL RESULT CALLED TO, READ BACK BY AND VERIFIED WITH: HURTEAU,L AT 0215 ON 11/11/2015 BY ISLEY,B     ABGS No results for input(s): PHART, PO2ART, TCO2, HCO3 in the last 72 hours.  Invalid input(s): PCO2 CULTURES No results found for this or any previous visit (from the past 240 hour(s)). Studies/Results: Dg Chest 2 View  Result Date: 11/10/2015 CLINICAL DATA:  Chest pain and hypertension while walking. EXAM: CHEST  2 VIEW COMPARISON:  03/02/2015 FINDINGS: Cardiomegaly. Aortic atherosclerosis. Interstitial and early alveolar edema. No consolidation or collapse. No effusions. No acute bone finding. IMPRESSION: Cardiomegaly.  Acute interstitial and alveolar edema. Electronically Signed   By: Nelson Chimes M.D.   On: 11/10/2015 10:57     Medications:  Prior to Admission:  Prescriptions Prior to Admission  Medication Sig Dispense Refill Last Dose  . atorvastatin (LIPITOR) 20 MG tablet Take 20 mg by mouth daily.   11/09/2015 at Unknown time  . diltiazem (CARDIZEM CD) 120 MG 24 hr capsule Take 1 capsule (120 mg total) by mouth daily. 30 capsule 12 11/09/2015 at Unknown time  . glimepiride (AMARYL) 2 MG tablet Take 2 mg by mouth daily.     11/09/2015 at Unknown time  . hydrALAZINE (APRESOLINE) 50 MG tablet Take 1.5 tablets (75 mg total) by mouth 3 (three) times daily. (Patient taking differently: Take 50 mg by mouth 3 (three) times daily. ) 45 tablet 6 11/09/2015 at Unknown time  . HYDROcodone-acetaminophen (NORCO/VICODIN) 5-325 MG tablet Take 1 tablet by mouth 4 (four) times daily.    11/10/2015 at Unknown time  . lisinopril (PRINIVIL,ZESTRIL) 40 MG tablet Take 40 mg by mouth 2 (two) times daily.     11/09/2015 at Unknown time  . metFORMIN (GLUCOPHAGE) 500 MG tablet Take 500 mg by mouth 2 (two) times daily with meals.     11/09/2015 at Unknown time  . potassium chloride SA (K-DUR,KLOR-CON) 20 MEQ tablet Take 2 tablets (40 mEq total) by mouth 2 (two) times daily. 120 tablet 5 11/09/2015 at Unknown time  . solifenacin (VESICARE) 5 MG tablet Take 5 mg by mouth daily.   11/09/2015 at Unknown time  . torsemide (DEMADEX) 20 MG tablet Take 1 tablet (20 mg total) by mouth 2 (two) times daily. 60 tablet 12 11/09/2015 at Unknown time  . TRAVATAN Z 0.004 % SOLN ophthalmic solution Place 1 drop into both eyes at bedtime.    11/09/2015 at Unknown time  . warfarin (COUMADIN) 5 MG tablet Take 1 tablet daily or as directed (Patient taking differently: Take 5 mg by mouth every morning. Take 1 tablet daily or as directed) 45 tablet 3 11/09/2015 at 0800   Scheduled: . darifenacin  7.5 mg Oral Daily  . diltiazem  120 mg Oral Daily  . hydrALAZINE  75 mg Oral TID  . HYDROcodone-acetaminophen  1 tablet Oral QID  . latanoprost  1 drop Both Eyes QHS  . mouth  rinse  15 mL Mouth Rinse BID  . nitroGLYCERIN  0.5 inch Topical Q6H  . potassium chloride SA  40 mEq Oral BID  . torsemide  20 mg Oral BID  . warfarin  5 mg Oral Q24H  . Warfarin - Physician  Dosing Inpatient   Does not apply q1800   Continuous: . heparin 900 Units/hr (11/11/15 0528)   XFF:KVQOHCOBTVMTN, gi cocktail, ondansetron (ZOFRAN) IV  Assesment:She was admitted with chest pain. She has elevated troponin level but this could be multifactorial. She also has acute heart failure and is going to have an echocardiogram today. She has chronic renal failure at baseline. She had malignant hypertension and her blood pressure is better. It seems that some of her blood pressure is volume dependent. Principal Problem:   Angina at rest [facility]) Active Problems:   Essential hypertension, benign   Tricuspid valve regurgitation   Chest pain   Acute CHF (congestive heart failure) ([place])    Plan: Continue current treatments. Continue diuresis. Echocardiogram today.    LOS: 0 days   Trashawn Oquendo L 11/11/2015, 8:17 AM

## 2015-11-11 NOTE — Care Management Obs Status (Signed)
MEDICARE OBSERVATION STATUS NOTIFICATION   Patient Details  Name: Melissa [name] MRN: [id number] Date of Birth: 12-10-1930   Medicare Observation Status Notification Given:  Yes    [name], Landree Fernholz Demske, RN 11/11/2015, 10:38 AM

## 2015-11-11 NOTE — Progress Notes (Signed)
*  PRELIMINARY RESULTS* Echocardiogram 2D Echocardiogram has been performed.  Stacey DrainWhite, Lois Ostrom J 11/11/2015, 1:14 PM

## 2015-11-11 NOTE — Progress Notes (Addendum)
Primary cardiologist: Dr. Jonelle SidleSamuel G. Laportia [name]  Subjective:  No further chest pain. Complains of headache from NTG patch  Objective:  Vital Signs in the last 24 hours: Temp:  [98 F (36.7 C)-98.4 F (36.9 C)] 98.3 F (36.8 C) (10/04 0600) Pulse Rate:  [58-106] 66 (10/04 0600) Resp:  [15-22] 16 (10/04 0600) BP: (129-203)/(72-115) 150/82 (10/04 0600) SpO2:  [91 %-100 %] 99 % (10/04 0600) Weight:  [143 lb 15.4 oz (65.3 kg)-144 lb (65.3 kg)] 143 lb 15.4 oz (65.3 kg) (10/03 1630)  Intake/Output from previous day: 10/03 0701 - 10/04 0700 In: 180 [P.O.:180] Out: -  Intake/Output from this shift: No intake/output data recorded.  Physical Exam: NECK: Increased JVD, HJR, no bruit LUNGS: rales bilaterally HEART: Irregular rate and rhythm,2/6 systolic murmur, no gallop, rub, bruit, thrill, or heave EXTREMITIES: Without cyanosis, clubbing, or edema  Lab Results:  Recent Labs  11/10/15 1023  WBC 5.1  HGB 12.7  PLT 240    Recent Labs  11/10/15 1023 11/11/15 0749  NA 138 136  K 4.5 4.6  CL 103 104  CO2 27 25  GLUCOSE 177* 119*  BUN 30* 27*  CREATININE 1.69* 1.52*    Recent Labs  11/10/15 2036 11/11/15 0118  TROPONINI 1.69* 1.89*   Cardiac Studies:  Assessment/Plan:  1. HTN emergency, BP better today - SBP in 200s on admission, elevated troponin with evidence of pulmonary edema. Reportedly by EMS SBP 260. Patient did not take her meds yest morning.  - in Er received dilt 120mg , hydralazine 75mg , lisinopril 40mg , torsemide 20mg .  At this time suspect demand ischemia due to severe HTN as opposed to acute obstructive CAD - increase hydralazine to 100mg  tid. Will place NG patch overnight in setting of CP and pulm edema.  If additional bp control needed room to titrate dilt if heart rates would tolerate. Other options include changing diltiazem to labetolol for rate control and increased bp control, or changing to beta blockerf for rate control and dihydropyrinde CCB  for bp.   - repeat echo in AM.  NPO for possible stress test today. Will hold off for now. Needs more diuresis. Await echo and decide further treatment.D/C NTG patch. On IV heparin as INR was 1.89.    2. Afib - rate controlled, continue diltiazem. She is on coumadin for stroke prevention, INR 1.89 so on heparin. CHADS2Vasc score is 5. - continue current meds   3. Chest pain with positive troponins .38, 1.69, 1.89 with more pronounced TWI inflat. Could be due to HTN emergency, but also could be ischemia. Await echo. Stop NTG patch  4. Renal insufficiencey: Crt 1.89, follow  5. Pulmonary edema:still with rales, I/O's not complete. Change to IV Lasix today.   LOS: 0 days    Melissa [name] 11/11/2015, 9:20 AM   Attending note:  Patient seen and examined. Discussed case with [name] PA-C and reviewed consultation note. Patient presents with hypertensive emergency associated with shortness of breath and chest discomfort, found to have flash pulmonary edema and elevated troponin I levels as well, most recently up to 1.89. She is not reporting any chest pain today. Blood pressure control is generally better. She has undergone medication adjustments, was also placed on nitroglycerin paste. Does not report any palpitations.  On examination she appears comfortable. Systolic blood pressure 150s, heart rate in the 60s to 70s in atrial fibrillation. Lungs with a few scattered crackles at the bases but nonlabored at rest. Extremities exhibit no edema. Lab work  shows creatinine 1.5 down from 1.7, potassium 4.6, hemoglobin 12.7, INR 2.26. ECGs reviewed, LVH with repolarization changes noted, also new lateral T wave inversions. Chest x-ray showed cardiomegaly with interstitial and alveolar edema.  Elevated troponin I in the setting of hypertensive emergency, NSTEMI suspected although still could reflect demand ischemia without true ACS or plaque rupture event. We will obtain an echocardiogram. She is on  IV heparin for now with recent subtherapeutic INR. Would not pursue Myoview study today. Current medications include Cardizem CD, IV Lasix, hydralazine, and potassium supplements. Stop Heparin since INR 2.2. Will add oral nitrates. We will follow with further recommendations regarding ischemic testing and management.  Jonelle Sidle, M.D., F.A.C.C.

## 2015-11-11 NOTE — Progress Notes (Signed)
ANTICOAGULATION CONSULT NOTE - Initial Consult  Pharmacy Consult for heparin Indication: afib  No Known Allergies  Patient Measurements: Height: 5\' 5"  (165.1 cm) Weight: 143 lb 15.4 oz (65.3 kg) IBW/kg (Calculated) : 57   Vital Signs: Temp: 98.4 F (36.9 C) (10/03 1957) Temp Source: Oral (10/03 1957) BP: 141/78 (10/04 0231) Pulse Rate: 61 (10/04 0231)  Labs:  Recent Labs  11/10/15 1023 11/10/15 1342 11/10/15 1639 11/10/15 2036 11/11/15 0118  HGB 12.7  --   --   --   --   HCT 38.4  --   --   --   --   PLT 240  --   --   --   --   LABPROT  --   --  22.0*  --   --   INR  --   --  1.89  --   --   CREATININE 1.69*  --   --   --   --   TROPONINI <0.03 0.38*  --  1.69* 1.89*    Estimated Creatinine Clearance: 23.1 mL/min (by C-G formula based on SCr of 1.69 mg/dL (H)).   Medical History: Past Medical History:  Diagnosis Date  . Atrial fibrillation (HCC)   . DDD (degenerative disc disease), lumbar   . Essential hypertension, benign   . Mixed hyperlipidemia   . Type 2 diabetes mellitus (HCC)     Assessment: 82 yo lady on coumadin for afib.  Will start heparin until INR therapeutic Goal of Therapy:  Heparin level 0.3-0.7 units/ml Monitor platelets by anticoagulation protocol: Yes   Plan:  Start heparin drip at 900 units/hr Check heparin level 8 hours after drip started Monitor for bleeding complications  Bernise Sylvain Poteet 11/11/2015,4:16 AM

## 2015-11-11 NOTE — Progress Notes (Signed)
CRITICAL VALUE ALERT  Critical value received:  Trop 1.89  Date of notification:  11/11/15  Time of notification:  0215  Critical value read back:Yes.    MD notified (1st page):  opyd  Time of first page:  0220  MD notified (2nd page ) opyd  Time of second  Page 0320  Responding MD:  opyd  Time MD responded:  0324  Heparin drip ordered

## 2015-11-11 NOTE — Care Management Note (Signed)
Case Management Note  Patient Details  Name: Melissa [name] MRN: [id number] Date of Birth: May 15, 1930  Subjective/Objective:                  Pt admitted with CP. She is from home, lives alone and has a daughter who stays with her at night. She reports being ind with ADL's. She has a walker but uses a can as needed. She has used HH in the past but is not currently active. She plans to return home with self care at DC.   Action/Plan: Will cont to follow for DC planning needs.   Expected Discharge Date:       11/12/2015           Expected Discharge Plan:  Home/Self Care  In-House Referral:  NA  Discharge planning Services  CM Consult  Post Acute Care Choice:    Choice offered to:     DME Arranged:    DME Agency:     HH Arranged:    HH Agency:     Status of Service:  In process, will continue to follow  If discussed at Long Length of Stay Meetings, dates discussed:    Additional Comments:  [name], Rajanee Schuelke Demske, RN 11/11/2015, 2:17 PM

## 2015-11-12 LAB — BASIC METABOLIC PANEL
Anion gap: 6 (ref 5–15)
BUN: 33 mg/dL — AB (ref 6–20)
CHLORIDE: 104 mmol/L (ref 101–111)
CO2: 26 mmol/L (ref 22–32)
Calcium: 8.9 mg/dL (ref 8.9–10.3)
Creatinine, Ser: 1.7 mg/dL — ABNORMAL HIGH (ref 0.44–1.00)
GFR calc Af Amer: 31 mL/min — ABNORMAL LOW (ref >60)
GFR calc non Af Amer: 27 mL/min — ABNORMAL LOW (ref >60)
GLUCOSE: 136 mg/dL — AB (ref 65–99)
POTASSIUM: 4.8 mmol/L (ref 3.5–5.1)
Sodium: 136 mmol/L (ref 135–145)

## 2015-11-12 LAB — PROTIME-INR
INR: 2.43
Prothrombin Time: 26.8 seconds — ABNORMAL HIGH (ref 11.4–15.2)

## 2015-11-12 MED ORDER — WARFARIN - PHARMACIST DOSING INPATIENT
Status: DC
  Administered 2015-11-12: 16:00:00

## 2015-11-12 MED ORDER — TORSEMIDE 20 MG PO TABS
20.0000 mg | ORAL_TABLET | Freq: Two times a day (BID) | ORAL | Status: DC
  Administered 2015-11-12 (×2): 20 mg via ORAL
  Filled 2015-11-12 (×2): qty 1

## 2015-11-12 MED ORDER — SODIUM CHLORIDE 0.9% FLUSH: 3.0000 mL | INTRAVENOUS | Status: DC | PRN

## 2015-11-12 MED ORDER — SODIUM CHLORIDE 0.9% FLUSH
3.0000 mL | Freq: Two times a day (BID) | INTRAVENOUS | Status: DC
  Administered 2015-11-12: 3 mL via INTRAVENOUS

## 2015-11-12 MED ORDER — ISOSORBIDE MONONITRATE ER 30 MG PO TB24: 30.0000 mg | ORAL_TABLET | Freq: Every day | ORAL | 12 refills | Status: DC

## 2015-11-12 MED ORDER — WARFARIN SODIUM 5 MG PO TABS
5.0000 mg | ORAL_TABLET | Freq: Once | ORAL | Status: AC
  Administered 2015-11-12: 5 mg via ORAL
  Filled 2015-11-12: qty 1

## 2015-11-12 MED ORDER — SODIUM CHLORIDE 0.9 % IV SOLN: 250.0000 mL | INTRAVENOUS | Status: DC | PRN

## 2015-11-12 NOTE — Progress Notes (Signed)
Primary Cardiologist: Nona DellMcDowell, Keondra Haydu MD  Cardiology Specific Problem List: 1. NSTEMI 2. Chronic atrial fibrillation  Subjective:    Feeling much better. No complaints of pain. Mild dyspnea when up in the room to the bathroom.  Objective:   Temp:  [97.7 F (36.5 C)-99.1 F (37.3 C)] 98.2 F (36.8 C) (10/04 2006) Pulse Rate:  [57-65] 61 (10/05 0000) Resp:  [18-20] 18 (10/05 0000) BP: (106-133)/(59-69) 133/69 (10/05 0000) SpO2:  [97 %-99 %] 98 % (10/05 0000) Last BM Date: 11/10/15  Filed Weights   11/10/15 0950 11/10/15 1630  Weight: 144 lb (65.3 kg) 143 lb 15.4 oz (65.3 kg)    Intake/Output Summary (Last 24 hours) at 11/12/15 [id number] Last data filed at 11/12/15 0000  Gross per 24 hour  Intake           290.55 ml  Output             1000 ml  Net          -709.45 ml    Telemetry: Atrial fibrillation, rates between 62-76 bpm. No pauses are arrhythmias.   Exam:  General: No acute distress.  HEENT: Conjunctiva and lids normal, oropharynx clear.  Lungs: Clear to auscultation, nonlabored.  Cardiac: No elevated JVP or bruits. IRRR, no gallop or rub.   Abdomen: Normoactive bowel sounds, nontender, nondistended.  Extremities: No pitting edema, distal pulses full.   Lab Results:  Basic Metabolic Panel:  Recent Labs Lab 11/10/15 1023 11/11/15 0749 11/12/15 0552  NA 138 136 136  K 4.5 4.6 4.8  CL 103 104 104  CO2 27 25 26   GLUCOSE 177* 119* 136*  BUN 30* 27* 33*  CREATININE 1.69* 1.52* 1.70*  CALCIUM 9.5 9.3 8.9    CBC:  Recent Labs Lab 11/10/15 1023  WBC 5.1  HGB 12.7  HCT 38.4  MCV 98.2  PLT 240    Cardiac Enzymes:  Recent Labs Lab 11/10/15 1342 11/10/15 2036 11/11/15 0118  TROPONINI 0.38* 1.69* 1.89*   Coagulation:  Recent Labs Lab 11/10/15 1639 11/11/15 0749 11/12/15 0552  INR 1.89 2.26 2.43    Radiology: Dg Chest 2 View  Result Date: 11/10/2015 CLINICAL DATA:  Chest pain and hypertension while walking. EXAM: CHEST  2  VIEW COMPARISON:  03/02/2015 FINDINGS: Cardiomegaly. Aortic atherosclerosis. Interstitial and early alveolar edema. No consolidation or collapse. No effusions. No acute bone finding. IMPRESSION: Cardiomegaly.  Acute interstitial and alveolar edema. Electronically Signed   By: Paulina FusiMark  Shogry M.D.   On: 11/10/2015 10:57   Echocardiogram 11/11/2015 Left ventricle: The cavity size was normal. Wall thickness was   increased in a pattern of mild LVH. Systolic function was normal.   The estimated ejection fraction was in the range of 60% to 65%.   There is akinesis of the mid-apicalanteroseptal and apical   myocardium. The study is not technically sufficient to allow   evaluation of LV diastolic function. - Aortic valve: Mildly calcified annulus. Trileaflet; mildly   thickened leaflets. - Mitral valve: Calcified annulus. There was trivial regurgitation. - Left atrium: The atrium was severely dilated. - Right atrium: The atrium was severely dilated. Central venous   pressure (est): 8 mm Hg. - Tricuspid valve: There was moderate regurgitation. - Pulmonary arteries: Systolic pressure was severely increased. PA   peak pressure: 84 mm Hg (S). - Pericardium, extracardiac: A trivial pericardial effusion was   identified posterior to the heart.   Medications:   Scheduled Medications: . darifenacin  7.5 mg Oral Daily  .  diltiazem  120 mg Oral Daily  . hydrALAZINE  75 mg Oral TID  . HYDROcodone-acetaminophen  1 tablet Oral QID  . isosorbide mononitrate  30 mg Oral Daily  . latanoprost  1 drop Both Eyes QHS  . mouth rinse  15 mL Mouth Rinse BID  . potassium chloride SA  40 mEq Oral BID  . torsemide  20 mg Oral BID  . warfarin  5 mg Oral Once  . Warfarin - Pharmacist Dosing Inpatient   Does not apply Q24H      PRN Medications: acetaminophen, gi cocktail, ondansetron (ZOFRAN) IV   Assessment and Plan:   1. NSTEMI: Peak troponin I 1.89. Noted in setting of hypertensive emergency with pulmonary  edema. Clinically improved at this time with good blood pressure control, diuresis and medication adjustments. LVEF 60-65% range with mid to apical anteroseptal akinesis consistent with underlying ischemic heart disease.  2. Atrial fibrillation: Heart rate is normalized and controlled on diltiazem 120 mg daily, and coumadin for anticoagulation  INR 2.43 this am.   3. Hypertension: BP is controlled. Continues on hydralazine, and isosorbide. Stop topical NTG paste.  4. Acute interstitial edema: On IV lasix 40 mg IV BID. Diuresed 1000 cc since admssion. Would transition back to po torsemide. Creatinine 1.70.     Bettey Mare. Lawrence NP AACC  11/12/2015, 9:22 AM   Attending note:  Patient seen and examined. Modified above note by Ms. Lawrence NP. Patient clinically improved today, no chest pain and breathing comfortably at rest. Peak troponin I was 1.89 and her echocardiogram revealed LVEF 60-65% with mid to apical anteroseptal akinesis consistent with underlying ischemic heart disease. Would term this NSTEMI, we discussed management options and at this point will plan to continue medical therapy, Imdur was added, transition back to [facility]. We did discuss cardiac catheterization if symptoms continue to accelerate. Would have her ambulate today, possibly discharge home this afternoon. She will need to see [place] back in about one week.  Jonelle Sidle, M.D., F.A.C.C.

## 2015-11-12 NOTE — Progress Notes (Signed)
ANTICOAGULATION CONSULT NOTE - follow up  Pharmacy Consult for Coumadin Indication: atrial fibrillation  No Known Allergies  Patient Measurements: Height: 5\' 5"  (165.1 cm) Weight: 143 lb 15.4 oz (65.3 kg) IBW/kg (Calculated) : 57  Vital Signs: BP: 133/69 (10/05 0000) Pulse Rate: 61 (10/05 0000)  Labs:  Recent Labs  11/10/15 1023 11/10/15 1342 11/10/15 1639 11/10/15 2036 11/11/15 0118 11/11/15 0749 11/12/15 0552  HGB 12.7  --   --   --   --   --   --   HCT 38.4  --   --   --   --   --   --   PLT 240  --   --   --   --   --   --   LABPROT  --   --  22.0*  --   --  25.4* 26.8*  INR  --   --  1.89  --   --  2.26 2.43  CREATININE 1.69*  --   --   --   --  1.52* 1.70*  TROPONINI <0.03 0.38*  --  1.69* 1.89*  --   --     Estimated Creatinine Clearance: 23 mL/min (by C-G formula based on SCr of 1.7 mg/dL (H)).   Medical History: Past Medical History:  Diagnosis Date  . Atrial fibrillation (HCC)   . DDD (degenerative disc disease), lumbar   . Essential hypertension, benign   . Mixed hyperlipidemia   . Type 2 diabetes mellitus (HCC)     Medications:  Prescriptions Prior to Admission  Medication Sig Dispense Refill Last Dose  . atorvastatin (LIPITOR) 20 MG tablet Take 20 mg by mouth daily.   11/09/2015 at Unknown time  . diltiazem (CARDIZEM CD) 120 MG 24 hr capsule Take 1 capsule (120 mg total) by mouth daily. 30 capsule 12 11/09/2015 at Unknown time  . glimepiride (AMARYL) 2 MG tablet Take 2 mg by mouth daily.     11/09/2015 at Unknown time  . hydrALAZINE (APRESOLINE) 50 MG tablet Take 1.5 tablets (75 mg total) by mouth 3 (three) times daily. (Patient taking differently: Take 50 mg by mouth 3 (three) times daily. ) 45 tablet 6 11/09/2015 at Unknown time  . HYDROcodone-acetaminophen (NORCO/VICODIN) 5-325 MG tablet Take 1 tablet by mouth 4 (four) times daily.    11/10/2015 at Unknown time  . lisinopril (PRINIVIL,ZESTRIL) 40 MG tablet Take 40 mg by mouth 2 (two) times daily.      11/09/2015 at Unknown time  . metFORMIN (GLUCOPHAGE) 500 MG tablet Take 500 mg by mouth 2 (two) times daily with meals.     11/09/2015 at Unknown time  . potassium chloride SA (K-DUR,KLOR-CON) 20 MEQ tablet Take 2 tablets (40 mEq total) by mouth 2 (two) times daily. 120 tablet 5 11/09/2015 at Unknown time  . solifenacin (VESICARE) 5 MG tablet Take 5 mg by mouth daily.   11/09/2015 at Unknown time  . torsemide (DEMADEX) 20 MG tablet Take 1 tablet (20 mg total) by mouth 2 (two) times daily. 60 tablet 12 11/09/2015 at Unknown time  . TRAVATAN Z 0.004 % SOLN ophthalmic solution Place 1 drop into both eyes at bedtime.    11/09/2015 at Unknown time  . warfarin (COUMADIN) 5 MG tablet Take 1 tablet daily or as directed (Patient taking differently: Take 5 mg by mouth every morning. Take 1 tablet daily or as directed) 45 tablet 3 11/09/2015 at 0800    Assessment: 82 yo female admitted with elevated troponins and  hypertensive emergency and admitted with chest pain..She has history of HTN, chronic LE edema, pulmonary HTN, DM2, chronic afib. Patient to continue with coumadin. INR this AM is therapeutic. Per anticoag clinic, patient is on Coumadin 5mg  daily except 2.5mg  on Sunday and Wednesday.    Goal of Therapy:  INR 2-3 Monitor platelets by anticoagulation protocol: Yes   Plan:  Coumadin 5mg  today x 1 PT/INR daily Monitor for S/S of bleeding, CBC  Valrie Hart, PharmD Clinical Pharmacist Pager:  [phone] 11/12/2015   11/12/2015,8:17 AM

## 2015-11-12 NOTE — ACP (Advance Care Planning) (Signed)
Assisted patient complete her Advance Directives. Gave her copies and placed one for her chart.

## 2015-11-12 NOTE — Care Management Note (Signed)
Case Management Note  Patient Details  Name: Melissa [name] MRN: [id number] Date of Birth: 13-Feb-1930  Expected Discharge Date:    11/12/2015              Expected Discharge Plan:  Home w Home Health Services  In-House Referral:  NA  Discharge planning Services  CM Consult  Post Acute Care Choice:  Home Health Choice offered to:  Patient  DME Arranged:    DME Agency:     HH Arranged:  RN HH Agency:  [facility]  Status of Service:  Completed, signed off  If discussed at Long Length of Stay Meetings, dates discussed:    Additional Comments: [facility] She has used [facility] in the past and would like to use them again. [facility]. Alroy BailiffLinda Lothian, of [facility], is aware of referral and will obtain pt info from chart. Anticipate DC home in next 24hrs.   Malcolm Metrohildress, Aneeka Bowden Demske, RN 11/12/2015, 3:07 PM

## 2015-11-12 NOTE — Progress Notes (Signed)
Discharge instructions given, verbalized understanding, out in stable condition via w/c with staff. 

## 2015-11-12 NOTE — Progress Notes (Signed)
Subjective: She says she feels ok with no more chest pain. Shortness of breath is better. Incomplete I&O but some diuresis. Echo results noted.   Objective: Vital signs in last 24 hours: Temp:  [97.7 F (36.5 C)-99.1 F (37.3 C)] 98.2 F (36.8 C) (10/04 2006) Pulse Rate:  [57-65] 61 (10/05 0000) Resp:  [18-20] 18 (10/05 0000) BP: (106-133)/(59-69) 133/69 (10/05 0000) SpO2:  [97 %-99 %] 98 % (10/05 0000) Weight change:  Last BM Date: 11/10/15  Intake/Output from previous day: 10/04 0701 - 10/05 0700 In: 290.6 [P.O.:240; I.V.:50.6] Out: 1000 [Urine:1000]  PHYSICAL EXAM General appearance: alert, cooperative and no distress Resp: rales bibasilar Cardio: irregularly irregular rhythm and systolic murmur: early systolic 3/6, harsh throughout the precordium GI: soft, non-tender; bowel sounds normal; no masses,  no organomegaly Extremities: extremities normal, atraumatic, no cyanosis or edema  Lab Results:  Results for orders placed or performed during the hospital encounter of 11/10/15 (from the past 48 hour(s))  Basic metabolic panel     Status: Abnormal   Collection Time: 11/10/15 10:23 AM  Result Value Ref Range   Sodium 138 135 - 145 mmol/L   Potassium 4.5 3.5 - 5.1 mmol/L   Chloride 103 101 - 111 mmol/L   CO2 27 22 - 32 mmol/L   Glucose, Bld 177 (H) 65 - 99 mg/dL   BUN 30 (H) 6 - 20 mg/dL   Creatinine, Ser 1.69 (H) 0.44 - 1.00 mg/dL   Calcium 9.5 8.9 - 10.3 mg/dL   GFR calc non Af Amer 27 (L) >60 mL/min   GFR calc Af Amer 31 (L) >60 mL/min    Comment: (NOTE) The eGFR has been calculated using the CKD EPI equation. This calculation has not been validated in all clinical situations. eGFR's persistently <60 mL/min signify possible Chronic Kidney Disease.    Anion gap 8 5 - 15  CBC     Status: None   Collection Time: 11/10/15 10:23 AM  Result Value Ref Range   WBC 5.1 4.0 - 10.5 K/uL   RBC 3.91 3.87 - 5.11 MIL/uL   Hemoglobin 12.7 12.0 - 15.0 g/dL   HCT 38.4 36.0 -  46.0 %   MCV 98.2 78.0 - 100.0 fL   MCH 32.5 26.0 - 34.0 pg   MCHC 33.1 30.0 - 36.0 g/dL   RDW 13.9 11.5 - 15.5 %   Platelets 240 150 - 400 K/uL  Troponin I     Status: None   Collection Time: 11/10/15 10:23 AM  Result Value Ref Range   Troponin I <0.03 <0.03 ng/mL  Troponin I     Status: Abnormal   Collection Time: 11/10/15  1:42 PM  Result Value Ref Range   Troponin I 0.38 (HH) <0.03 ng/mL    Comment: DELTA CHECK NOTED CRITICAL RESULT CALLED TO, READ BACK BY AND VERIFIED WITH: ROBERSON,L AT 1450 ON 11/10/2015 BY ISLEY,B   Protime-INR     Status: Abnormal   Collection Time: 11/10/15  4:39 PM  Result Value Ref Range   Prothrombin Time 22.0 (H) 11.4 - 15.2 seconds   INR 1.89   Troponin I (q 6hr x 3)     Status: Abnormal   Collection Time: 11/10/15  8:36 PM  Result Value Ref Range   Troponin I 1.69 (HH) <0.03 ng/mL    Comment: CRITICAL RESULT CALLED TO, READ BACK BY AND VERIFIED WITH: HURTEAU,L ON 11/10/15 AT 2210 BY LOY,C   Troponin I (q 6hr x 3)  Status: Abnormal   Collection Time: 11/11/15  1:18 AM  Result Value Ref Range   Troponin I 1.89 (HH) <0.03 ng/mL    Comment: CRITICAL RESULT CALLED TO, READ BACK BY AND VERIFIED WITH: HURTEAU,L AT 0215 ON 11/11/2015 BY ISLEY,B   Protime-INR     Status: Abnormal   Collection Time: 11/11/15  7:49 AM  Result Value Ref Range   Prothrombin Time 25.4 (H) 11.4 - 15.2 seconds   INR 4.70   Basic metabolic panel     Status: Abnormal   Collection Time: 11/11/15  7:49 AM  Result Value Ref Range   Sodium 136 135 - 145 mmol/L   Potassium 4.6 3.5 - 5.1 mmol/L   Chloride 104 101 - 111 mmol/L   CO2 25 22 - 32 mmol/L   Glucose, Bld 119 (H) 65 - 99 mg/dL   BUN 27 (H) 6 - 20 mg/dL   Creatinine, Ser 1.52 (H) 0.44 - 1.00 mg/dL   Calcium 9.3 8.9 - 10.3 mg/dL   GFR calc non Af Amer 31 (L) >60 mL/min   GFR calc Af Amer 36 (L) >60 mL/min    Comment: (NOTE) The eGFR has been calculated using the CKD EPI equation. This calculation has not been  validated in all clinical situations. eGFR's persistently <60 mL/min signify possible Chronic Kidney Disease.    Anion gap 7 5 - 15  Protime-INR     Status: Abnormal   Collection Time: 11/12/15  5:52 AM  Result Value Ref Range   Prothrombin Time 26.8 (H) 11.4 - 15.2 seconds   INR 9.62   Basic metabolic panel     Status: Abnormal   Collection Time: 11/12/15  5:52 AM  Result Value Ref Range   Sodium 136 135 - 145 mmol/L   Potassium 4.8 3.5 - 5.1 mmol/L   Chloride 104 101 - 111 mmol/L   CO2 26 22 - 32 mmol/L   Glucose, Bld 136 (H) 65 - 99 mg/dL   BUN 33 (H) 6 - 20 mg/dL   Creatinine, Ser 1.70 (H) 0.44 - 1.00 mg/dL   Calcium 8.9 8.9 - 10.3 mg/dL   GFR calc non Af Amer 27 (L) >60 mL/min   GFR calc Af Amer 31 (L) >60 mL/min    Comment: (NOTE) The eGFR has been calculated using the CKD EPI equation. This calculation has not been validated in all clinical situations. eGFR's persistently <60 mL/min signify possible Chronic Kidney Disease.    Anion gap 6 5 - 15    ABGS No results for input(s): PHART, PO2ART, TCO2, HCO3 in the last 72 hours.  Invalid input(s): PCO2 CULTURES No results found for this or any previous visit (from the past 240 hour(s)). Studies/Results: Dg Chest 2 View  Result Date: 11/10/2015 CLINICAL DATA:  Chest pain and hypertension while walking. EXAM: CHEST  2 VIEW COMPARISON:  03/02/2015 FINDINGS: Cardiomegaly. Aortic atherosclerosis. Interstitial and early alveolar edema. No consolidation or collapse. No effusions. No acute bone finding. IMPRESSION: Cardiomegaly.  Acute interstitial and alveolar edema. Electronically Signed   By: Nelson Chimes M.D.   On: 11/10/2015 10:57    Medications:  Prior to Admission:  Prescriptions Prior to Admission  Medication Sig Dispense Refill Last Dose  . atorvastatin (LIPITOR) 20 MG tablet Take 20 mg by mouth daily.   11/09/2015 at Unknown time  . diltiazem (CARDIZEM CD) 120 MG 24 hr capsule Take 1 capsule (120 mg total) by mouth  daily. 30 capsule 12 11/09/2015 at Unknown time  .  glimepiride (AMARYL) 2 MG tablet Take 2 mg by mouth daily.     11/09/2015 at Unknown time  . hydrALAZINE (APRESOLINE) 50 MG tablet Take 1.5 tablets (75 mg total) by mouth 3 (three) times daily. (Patient taking differently: Take 50 mg by mouth 3 (three) times daily. ) 45 tablet 6 11/09/2015 at Unknown time  . HYDROcodone-acetaminophen (NORCO/VICODIN) 5-325 MG tablet Take 1 tablet by mouth 4 (four) times daily.    11/10/2015 at Unknown time  . lisinopril (PRINIVIL,ZESTRIL) 40 MG tablet Take 40 mg by mouth 2 (two) times daily.     11/09/2015 at Unknown time  . metFORMIN (GLUCOPHAGE) 500 MG tablet Take 500 mg by mouth 2 (two) times daily with meals.     11/09/2015 at Unknown time  . potassium chloride SA (K-DUR,KLOR-CON) 20 MEQ tablet Take 2 tablets (40 mEq total) by mouth 2 (two) times daily. 120 tablet 5 11/09/2015 at Unknown time  . solifenacin (VESICARE) 5 MG tablet Take 5 mg by mouth daily.   11/09/2015 at Unknown time  . torsemide (DEMADEX) 20 MG tablet Take 1 tablet (20 mg total) by mouth 2 (two) times daily. 60 tablet 12 11/09/2015 at Unknown time  . TRAVATAN Z 0.004 % SOLN ophthalmic solution Place 1 drop into both eyes at bedtime.    11/09/2015 at Unknown time  . warfarin (COUMADIN) 5 MG tablet Take 1 tablet daily or as directed (Patient taking differently: Take 5 mg by mouth every morning. Take 1 tablet daily or as directed) 45 tablet 3 11/09/2015 at 0800   Scheduled: . darifenacin  7.5 mg Oral Daily  . diltiazem  120 mg Oral Daily  . furosemide  40 mg Intravenous Q12H  . hydrALAZINE  75 mg Oral TID  . HYDROcodone-acetaminophen  1 tablet Oral QID  . isosorbide mononitrate  30 mg Oral Daily  . latanoprost  1 drop Both Eyes QHS  . mouth rinse  15 mL Mouth Rinse BID  . potassium chloride SA  40 mEq Oral BID  . warfarin  5 mg Oral Once  . Warfarin - Pharmacist Dosing Inpatient   Does not apply Q24H   Continuous:  PXT:GGYIRSWNIOEVO, gi cocktail,  ondansetron (ZOFRAN) IV  Assesment:she was admitted with chest pain and elevated troponin. ?nonSTEMI. She seems better but still with some rales.  Principal Problem:   Angina at rest [facility]) Active Problems:   Essential hypertension, benign   Tricuspid valve regurgitation   Chest pain   Acute CHF (congestive heart failure) ([place])    Plan:continue efforts at diuresis. ?ischemic work up through cardiology.    LOS: 1 day   Trig Mcbryar L 11/12/2015, 8:34 AM

## 2015-11-13 NOTE — Discharge Summary (Signed)
Physician Discharge Summary  Patient ID: Melissa [name] MRN: [id number] DOB/AGE: 07-Sep-1930 82 y.o. Primary Care Physician:Vallery Mcdade L, MD Admit date: 11/10/2015 Discharge date: 11/13/2015    Discharge Diagnoses:   Principal Problem:   Angina at rest [facility]) Active Problems:   Essential hypertension, benign   Tricuspid valve regurgitation   Chest pain   Acute CHF (congestive heart failure) (HCC) Acute on chronic diastolic heart failure Non-STEMI Pulmonary hypertension Chronic atrial fibrillation Diabetes mellitus type 2 Hyperlipidemia hard of hearing Overactive bladder Glaucoma    Medication List    TAKE these medications   atorvastatin 20 MG tablet Commonly known as:  LIPITOR Take 20 mg by mouth daily.   diltiazem 120 MG 24 hr capsule Commonly known as:  CARDIZEM CD Take 1 capsule (120 mg total) by mouth daily.   glimepiride 2 MG tablet Commonly known as:  AMARYL Take 2 mg by mouth daily.   hydrALAZINE 50 MG tablet Commonly known as:  APRESOLINE Take 1.5 tablets (75 mg total) by mouth 3 (three) times daily. What changed:  how much to take   HYDROcodone-acetaminophen 5-325 MG tablet Commonly known as:  NORCO/VICODIN Take 1 tablet by mouth 4 (four) times daily.   isosorbide mononitrate 30 MG 24 hr tablet Commonly known as:  IMDUR Take 1 tablet (30 mg total) by mouth daily.   lisinopril 40 MG tablet Commonly known as:  PRINIVIL,ZESTRIL Take 40 mg by mouth 2 (two) times daily.   metFORMIN 500 MG tablet Commonly known as:  GLUCOPHAGE Take 500 mg by mouth 2 (two) times daily with meals.   potassium chloride SA 20 MEQ tablet Commonly known as:  K-DUR,KLOR-CON Take 2 tablets (40 mEq total) by mouth 2 (two) times daily.   solifenacin 5 MG tablet Commonly known as:  VESICARE Take 5 mg by mouth daily.   torsemide 20 MG tablet Commonly known as:  DEMADEX Take 1 tablet (20 mg total) by mouth 2 (two) times daily.   TRAVATAN Z 0.004 % Soln  ophthalmic solution Generic drug:  Travoprost (BAK Free) Place 1 drop into both eyes at bedtime.   warfarin 5 MG tablet Commonly known as:  COUMADIN Take 1 tablet daily or as directed What changed:  how much to take  how to take this  when to take this  additional instructions       Discharged Condition: Improved    Consults: Cardiology  Significant Diagnostic Studies: Dg Chest 2 View  Result Date: 11/10/2015 CLINICAL DATA:  Chest pain and hypertension while walking. EXAM: CHEST  2 VIEW COMPARISON:  03/02/2015 FINDINGS: Cardiomegaly. Aortic atherosclerosis. Interstitial and early alveolar edema. No consolidation or collapse. No effusions. No acute bone finding. IMPRESSION: Cardiomegaly.  Acute interstitial and alveolar edema. Electronically Signed   By: Paulina Fusi M.D.   On: 11/10/2015 10:57    Lab Results: Basic Metabolic Panel:  Recent Labs  40/98/11 0749 11/12/15 0552  NA 136 136  K 4.6 4.8  CL 104 104  CO2 25 26  GLUCOSE 119* 136*  BUN 27* 33*  CREATININE 1.52* 1.70*  CALCIUM 9.3 8.9   Liver Function Tests: No results for input(s): AST, ALT, ALKPHOS, BILITOT, PROT, ALBUMIN in the last 72 hours.   CBC:  Recent Labs  11/10/15 1023  WBC 5.1  HGB 12.7  HCT 38.4  MCV 98.2  PLT 240    No results found for this or any previous visit (from the past 240 hour(s)).   Hospital Course: This is an 82 year old who  came to the hospital with chest pain. She was walking and developed chest pain that lasted for about 30 minutes or so. When she arrived in the emergency department she had changes in her electrocardiogram and although the initial troponin was negative that was positive soon afterward. She was noted to have what looked like congestive heart failure on chest x-ray and exam. She was started on heparin because she was not therapeutic with her Coumadin, given nitroglycerin transdermally started on diuresis and improved. She had cardiology consultation.  Her troponin level continued to rise and she was felt to have had a non-STEMI. She had echocardiogram that showed good systolic function with diastolic dysfunction and an area of hypokinesis/akinesis. She also had significant pulmonary hypertension. She improved over the next several days was able to diurese significantly and by the time of discharge felt much better and was ready to go home with home health services.  Discharge Exam: Blood pressure (!) 111/54, pulse (!) 50, temperature 97.8 F (36.6 C), temperature source Oral, resp. rate 18, height 5\' 5"  (1.651 m), weight 65.3 kg (143 lb 15.4 oz), SpO2 99 %. She is awake and alert. She has atrial fib. Her chest is clear. She does not have any peripheral edema  Disposition: Home with home health services  Discharge Instructions    Face-to-face encounter (required for Medicare/Medicaid patients)    Complete by:  As directed    I Clovis Mankins L certify that this patient is under my care and that I, or a nurse practitioner or physician's assistant working with me, had a face-to-face encounter that meets the physician face-to-face encounter requirements with this patient on 11/12/2015. The encounter with the patient was in whole, or in part for the following medical condition(s) which is the primary reason for home health care (List medical condition): non-stemi,hypertension   The encounter with the patient was in whole, or in part, for the following medical condition, which is the primary reason for home health care:  non-stemi,hypertension   I certify that, based on my findings, the following services are medically necessary home health services:   Nursing Physical therapy     Reason for Medically Necessary Home Health Services:  Skilled Nursing- Change/Decline in Patient Status   My clinical findings support the need for the above services:  Shortness of breath with activity   Further, I certify that my clinical findings support that this patient  is homebound due to:  Shortness of Breath with activity   Home Health    Complete by:  As directed    To provide the following care/treatments:   PT RN        Follow-up Information    Janziel Hockett L, MD. Go on 12/03/2015.   Specialty:  Pulmonary Disease Why:  At 2pm Contact information: [address] [place] [address] [phone]           Signed: Che Below L   11/13/2015, 8:00 AM

## 2015-11-22 ENCOUNTER — Emergency Department (HOSPITAL_COMMUNITY): Payer: Medicare Other

## 2015-11-22 ENCOUNTER — Encounter (HOSPITAL_COMMUNITY): Payer: Self-pay | Admitting: Emergency Medicine

## 2015-11-22 ENCOUNTER — Inpatient Hospital Stay (HOSPITAL_COMMUNITY)
Admission: EM | Admit: 2015-11-22 | Discharge: 2015-12-05 | DRG: 246 | Disposition: A | Discharge status: Home / Self Care | Payer: Medicare Other | Attending: Cardiovascular Disease | Admitting: Cardiovascular Disease

## 2015-11-22 DIAGNOSIS — Z8249 Family history of ischemic heart disease and other diseases of the circulatory system: Secondary | ICD-10-CM | POA: Diagnosis not present

## 2015-11-22 DIAGNOSIS — I482 Chronic atrial fibrillation: Secondary | ICD-10-CM | POA: Diagnosis present

## 2015-11-22 DIAGNOSIS — R600 Localized edema: Secondary | ICD-10-CM | POA: Diagnosis present

## 2015-11-22 DIAGNOSIS — Z7901 Long term (current) use of anticoagulants: Secondary | ICD-10-CM

## 2015-11-22 DIAGNOSIS — R001 Bradycardia, unspecified: Secondary | ICD-10-CM | POA: Diagnosis not present

## 2015-11-22 DIAGNOSIS — N184 Chronic kidney disease, stage 4 (severe): Secondary | ICD-10-CM | POA: Diagnosis present

## 2015-11-22 DIAGNOSIS — I272 Pulmonary hypertension, unspecified: Secondary | ICD-10-CM | POA: Diagnosis present

## 2015-11-22 DIAGNOSIS — E1122 Type 2 diabetes mellitus with diabetic chronic kidney disease: Secondary | ICD-10-CM | POA: Diagnosis present

## 2015-11-22 DIAGNOSIS — I214 Non-ST elevation (NSTEMI) myocardial infarction: Secondary | ICD-10-CM

## 2015-11-22 DIAGNOSIS — Z833 Family history of diabetes mellitus: Secondary | ICD-10-CM

## 2015-11-22 DIAGNOSIS — I130 Hypertensive heart and chronic kidney disease with heart failure and stage 1 through stage 4 chronic kidney disease, or unspecified chronic kidney disease: Secondary | ICD-10-CM | POA: Diagnosis present

## 2015-11-22 DIAGNOSIS — I071 Rheumatic tricuspid insufficiency: Secondary | ICD-10-CM | POA: Diagnosis present

## 2015-11-22 DIAGNOSIS — G8929 Other chronic pain: Secondary | ICD-10-CM | POA: Diagnosis present

## 2015-11-22 DIAGNOSIS — I200 Unstable angina: Secondary | ICD-10-CM | POA: Diagnosis not present

## 2015-11-22 DIAGNOSIS — I5032 Chronic diastolic (congestive) heart failure: Secondary | ICD-10-CM | POA: Diagnosis present

## 2015-11-22 DIAGNOSIS — I700 Atherosclerosis of aorta: Secondary | ICD-10-CM

## 2015-11-22 DIAGNOSIS — Z96642 Presence of left artificial hip joint: Secondary | ICD-10-CM | POA: Diagnosis present

## 2015-11-22 DIAGNOSIS — Y838 Other surgical procedures as the cause of abnormal reaction of the patient, or of later complication, without mention of misadventure at the time of the procedure: Secondary | ICD-10-CM | POA: Diagnosis not present

## 2015-11-22 DIAGNOSIS — I2510 Atherosclerotic heart disease of native coronary artery without angina pectoris: Secondary | ICD-10-CM | POA: Diagnosis not present

## 2015-11-22 DIAGNOSIS — I10 Essential (primary) hypertension: Secondary | ICD-10-CM

## 2015-11-22 DIAGNOSIS — Y713 Surgical instruments, materials and cardiovascular devices (including sutures) associated with adverse incidents: Secondary | ICD-10-CM | POA: Diagnosis not present

## 2015-11-22 DIAGNOSIS — I25110 Atherosclerotic heart disease of native coronary artery with unstable angina pectoris: Secondary | ICD-10-CM | POA: Diagnosis present

## 2015-11-22 DIAGNOSIS — K219 Gastro-esophageal reflux disease without esophagitis: Secondary | ICD-10-CM | POA: Diagnosis present

## 2015-11-22 DIAGNOSIS — E782 Mixed hyperlipidemia: Secondary | ICD-10-CM | POA: Diagnosis present

## 2015-11-22 DIAGNOSIS — M5136 Other intervertebral disc degeneration, lumbar region: Secondary | ICD-10-CM | POA: Diagnosis present

## 2015-11-22 DIAGNOSIS — I129 Hypertensive chronic kidney disease with stage 1 through stage 4 chronic kidney disease, or unspecified chronic kidney disease: Secondary | ICD-10-CM | POA: Diagnosis not present

## 2015-11-22 DIAGNOSIS — R079 Chest pain, unspecified: Secondary | ICD-10-CM | POA: Diagnosis present

## 2015-11-22 DIAGNOSIS — D638 Anemia in other chronic diseases classified elsewhere: Secondary | ICD-10-CM | POA: Diagnosis present

## 2015-11-22 DIAGNOSIS — Z7984 Long term (current) use of oral hypoglycemic drugs: Secondary | ICD-10-CM | POA: Diagnosis not present

## 2015-11-22 DIAGNOSIS — I4819 Other persistent atrial fibrillation: Secondary | ICD-10-CM | POA: Diagnosis present

## 2015-11-22 DIAGNOSIS — Z955 Presence of coronary angioplasty implant and graft: Secondary | ICD-10-CM

## 2015-11-22 DIAGNOSIS — I9751 Accidental puncture and laceration of a circulatory system organ or structure during a circulatory system procedure: Secondary | ICD-10-CM | POA: Diagnosis not present

## 2015-11-22 DIAGNOSIS — I5033 Acute on chronic diastolic (congestive) heart failure: Secondary | ICD-10-CM | POA: Diagnosis not present

## 2015-11-22 DIAGNOSIS — E871 Hypo-osmolality and hyponatremia: Secondary | ICD-10-CM | POA: Diagnosis present

## 2015-11-22 DIAGNOSIS — I481 Persistent atrial fibrillation: Secondary | ICD-10-CM | POA: Diagnosis present

## 2015-11-22 DIAGNOSIS — D649 Anemia, unspecified: Secondary | ICD-10-CM | POA: Diagnosis present

## 2015-11-22 DIAGNOSIS — F0390 Unspecified dementia without behavioral disturbance: Secondary | ICD-10-CM | POA: Diagnosis present

## 2015-11-22 DIAGNOSIS — Z9861 Coronary angioplasty status: Secondary | ICD-10-CM

## 2015-11-22 DIAGNOSIS — H9190 Unspecified hearing loss, unspecified ear: Secondary | ICD-10-CM | POA: Diagnosis present

## 2015-11-22 HISTORY — DX: Heart failure, unspecified: I50.9

## 2015-11-22 LAB — CBC
HCT: 31 % — ABNORMAL LOW (ref 36.0–46.0)
Hemoglobin: 10.5 g/dL — ABNORMAL LOW (ref 12.0–15.0)
MCH: 32 pg (ref 26.0–34.0)
MCHC: 33.9 g/dL (ref 30.0–36.0)
MCV: 94.5 fL (ref 78.0–100.0)
PLATELETS: 269 10*3/uL (ref 150–400)
RBC: 3.28 MIL/uL — AB (ref 3.87–5.11)
RDW: 13.7 % (ref 11.5–15.5)
WBC: 6.7 10*3/uL (ref 4.0–10.5)

## 2015-11-22 LAB — I-STAT TROPONIN, ED: TROPONIN I, POC: 0.03 ng/mL (ref 0.00–0.08)

## 2015-11-22 LAB — COMPREHENSIVE METABOLIC PANEL
ALBUMIN: 3.8 g/dL (ref 3.5–5.0)
ALK PHOS: 163 U/L — AB (ref 38–126)
ALT: 17 U/L (ref 14–54)
AST: 33 U/L (ref 15–41)
Anion gap: 10 (ref 5–15)
BUN: 36 mg/dL — AB (ref 6–20)
CALCIUM: 9.3 mg/dL (ref 8.9–10.3)
CHLORIDE: 97 mmol/L — AB (ref 101–111)
CO2: 20 mmol/L — AB (ref 22–32)
CREATININE: 1.98 mg/dL — AB (ref 0.44–1.00)
GFR calc non Af Amer: 22 mL/min — ABNORMAL LOW (ref >60)
GFR, EST AFRICAN AMERICAN: 26 mL/min — AB (ref >60)
GLUCOSE: 108 mg/dL — AB (ref 65–99)
Potassium: 5.4 mmol/L — ABNORMAL HIGH (ref 3.5–5.1)
SODIUM: 127 mmol/L — AB (ref 135–145)
Total Bilirubin: 0.8 mg/dL (ref 0.3–1.2)
Total Protein: 7.3 g/dL (ref 6.5–8.1)

## 2015-11-22 LAB — PROTIME-INR
INR: 2.19
Prothrombin Time: 24.7 seconds — ABNORMAL HIGH (ref 11.4–15.2)

## 2015-11-22 LAB — APTT: APTT: 45 s — AB (ref 24–36)

## 2015-11-22 LAB — TROPONIN I: Troponin I: 0.06 ng/mL (ref <0.03)

## 2015-11-22 MED ORDER — NITROGLYCERIN 0.4 MG SL SUBL
0.4000 mg | SUBLINGUAL_TABLET | SUBLINGUAL | Status: AC | PRN
  Administered 2015-11-23 (×3): 0.4 mg via SUBLINGUAL
  Filled 2015-11-22 (×3): qty 1

## 2015-11-22 MED ORDER — HEPARIN BOLUS VIA INFUSION: 4000.0000 [IU] | Freq: Once | INTRAVENOUS | Status: DC

## 2015-11-22 MED ORDER — NITROGLYCERIN 0.4 MG/HR TD PT24
0.4000 mg | MEDICATED_PATCH | Freq: Every day | TRANSDERMAL | Status: DC
  Administered 2015-11-22 – 2015-11-23 (×2): 0.4 mg via TRANSDERMAL
  Filled 2015-11-22 (×4): qty 1

## 2015-11-22 MED ORDER — NITROGLYCERIN IN D5W 200-5 MCG/ML-% IV SOLN: 5.0000 ug/min | INTRAVENOUS | Status: DC

## 2015-11-22 MED ORDER — HEPARIN (PORCINE) IN NACL 100-0.45 UNIT/ML-% IJ SOLN: 1000.0000 [IU]/h | INTRAMUSCULAR | Status: DC

## 2015-11-22 MED ORDER — FUROSEMIDE 40 MG PO TABS
40.0000 mg | ORAL_TABLET | Freq: Once | ORAL | Status: AC
  Administered 2015-11-22: 40 mg via ORAL
  Filled 2015-11-22: qty 1

## 2015-11-22 MED ORDER — ASPIRIN 81 MG PO CHEW
324.0000 mg | CHEWABLE_TABLET | Freq: Once | ORAL | Status: AC
  Administered 2015-11-22: 324 mg via ORAL
  Filled 2015-11-22: qty 4

## 2015-11-22 NOTE — ED Provider Notes (Signed)
[facility]-EMERGENCY DEPT Provider Note   CSN: [id number] Arrival date & time: 11/22/15  1934     History   Chief Complaint Chief Complaint  Patient presents with  . Chest Pain  . Weakness    HPI Melissa [name] is a 82 y.o. female.  HPI 82 year old female history of A. fib recently admitted with chest pain thought secondary to demand ischemia from hypertensive urgency. She began having chest pain today with any exertion. It has resolved with rest. She has had some associated dyspnea. She has not taken any medications as it resolves if she sits still. She is currently not having pain. Any cough, fever, abdominal pain, nausea, vomiting. Past Medical History:  Diagnosis Date  . Atrial fibrillation (HCC)   . DDD (degenerative disc disease), lumbar   . Essential hypertension, benign   . Mixed hyperlipidemia   . Type 2 diabetes mellitus [facility](HCC)     Patient Active Problem List   Diagnosis Date Noted  . Chest pain 11/10/2015  . Angina at rest [facility](HCC) 11/10/2015  . Acute CHF (congestive heart failure) (HCC) 11/10/2015  . Chronic venous insufficiency 02/19/2015  . CHF (congestive heart failure) (HCC) 02/17/2015  . Tricuspid valve regurgitation 02/17/2015  . Encounter for therapeutic drug monitoring 03/11/2013  . Long term current use of anticoagulant 05/05/2010  . Mixed hyperlipidemia 09/30/2009  . Essential hypertension, benign 09/24/2008  . Atrial fibrillation (HCC) 09/24/2008    Past Surgical History:  Procedure Laterality Date  . TOTAL HIP ARTHROPLASTY  01/14/03   Left    OB History    No data available       Home Medications    Prior to Admission medications   Medication Sig Start Date End Date Taking? Authorizing Provider  ALPRAZolam (XANAX) 0.25 MG tablet Take 0.25 mg by mouth 3 (three) times daily as needed. 11/19/15  Yes Historical Provider, MD  atorvastatin (LIPITOR) 20 MG tablet Take 20 mg by mouth daily.   Yes Historical Provider, MD  diltiazem (CARDIZEM CD)  120 MG 24 hr capsule Take 1 capsule (120 mg total) by mouth daily. 02/25/15  Yes Kari BaarsEdward Hawkins, MD  glimepiride (AMARYL) 2 MG tablet Take 2 mg by mouth daily.     Yes Historical Provider, MD  hydrALAZINE (APRESOLINE) 50 MG tablet Take 1.5 tablets (75 mg total) by mouth 3 (three) times daily. Patient taking differently: Take 50 mg by mouth 3 (three) times daily.  11/05/15  Yes Jonelle SidleSamuel G McDowell, MD  HYDROcodone-acetaminophen (NORCO/VICODIN) 5-325 MG tablet Take 1 tablet by mouth 4 (four) times daily.  09/10/14  Yes Historical Provider, MD  isosorbide mononitrate (IMDUR) 30 MG 24 hr tablet Take 1 tablet (30 mg total) by mouth daily. 11/13/15  Yes Kari BaarsEdward Hawkins, MD  lisinopril (PRINIVIL,ZESTRIL) 40 MG tablet Take 40 mg by mouth 2 (two) times daily.     Yes Historical Provider, MD  metFORMIN (GLUCOPHAGE) 500 MG tablet Take 500 mg by mouth 2 (two) times daily with meals.     Yes Historical Provider, MD  potassium chloride SA (K-DUR,KLOR-CON) 20 MEQ tablet Take 2 tablets (40 mEq total) by mouth 2 (two) times daily. 02/25/15  Yes Kari BaarsEdward Hawkins, MD  solifenacin (VESICARE) 5 MG tablet Take 5 mg by mouth daily.   Yes Historical Provider, MD  torsemide (DEMADEX) 20 MG tablet Take 1 tablet (20 mg total) by mouth 2 (two) times daily. 02/25/15  Yes Kari BaarsEdward Hawkins, MD  TRAVATAN Z 0.004 % SOLN ophthalmic solution Place 1 drop into both eyes at  bedtime.  11/08/14  Yes Historical Provider, MD  warfarin (COUMADIN) 5 MG tablet Take 1 tablet daily or as directed Patient taking differently: Take 2.5-5 mg by mouth every hour as needed. Take one tablet daily except take one-half tablet on Wednesdays and Sundays 06/15/15  Yes Jonelle Sidle, MD    Family History Family History  Problem Relation Age of Onset  . Diabetes type II Mother   . Diabetes type II Father   . Hypertension Father     Social History Social History  Substance Use Topics  . Smoking status: Never Smoker  . Smokeless tobacco: Never Used  . Alcohol  use No     Allergies   Review of patient's allergies indicates no known allergies.   Review of Systems Review of Systems  All other systems reviewed and are negative.    Physical Exam Updated Vital Signs BP 166/84   Pulse (!) 59   Temp 97.9 F (36.6 C) (Oral)   Resp 14   Ht 5\' 4"  (1.626 m)   Wt 64.9 kg   SpO2 98%   BMI 24.55 kg/m   Physical Exam  Constitutional: She is oriented to person, place, and time. She appears well-developed and well-nourished. No distress.  HENT:  Head: Normocephalic and atraumatic.  Right Ear: External ear normal.  Left Ear: External ear normal.  Nose: Nose normal.  Eyes: Conjunctivae and EOM are normal. Pupils are equal, round, and reactive to light.  Neck: Normal range of motion. Neck supple.  Cardiovascular: Normal rate, regular rhythm, normal heart sounds and intact distal pulses.   Pulmonary/Chest: Effort normal.  Abdominal: Soft. Bowel sounds are normal.  Musculoskeletal: Normal range of motion. She exhibits edema.  Neurological: She is alert and oriented to person, place, and time. She exhibits normal muscle tone. Coordination normal.  Skin: Skin is warm and dry.  Psychiatric: She has a normal mood and affect. Her behavior is normal. Thought content normal.  Nursing note and vitals reviewed.    ED Treatments / Results  Labs (all labs ordered are listed, but only abnormal results are displayed) Labs Reviewed  CBC - Abnormal; Notable for the following:       Result Value   RBC 3.28 (*)    Hemoglobin 10.5 (*)    HCT 31.0 (*)    All other components within normal limits  APTT - Abnormal; Notable for the following:    aPTT 45 (*)    All other components within normal limits  PROTIME-INR - Abnormal; Notable for the following:    Prothrombin Time 24.7 (*)    All other components within normal limits  COMPREHENSIVE METABOLIC PANEL  TROPONIN I  I-STAT TROPOININ, ED    EKG  EKG Interpretation  Date/Time:  Sunday November 22 2015 19:41:51 EDT Ventricular Rate:  54 PR Interval:    QRS Duration: 102 QT Interval:  488 QTC Calculation: 462 R Axis:   6 Text Interpretation:  Undetermined rhythm ST & Marked T wave abnormality, consider anterolateral ischemia Abnormal ECG Confirmed by Helane Briceno MD, Duwayne Heck [phone]) on 11/22/2015 7:45:39 PM       Radiology Dg Chest Portable 1 View  Result Date: 11/22/2015 CLINICAL DATA:  Chest pain and weakness EXAM: PORTABLE CHEST 1 VIEW COMPARISON:  11/10/2015 FINDINGS: Chronic cardiopericardial enlargement. There is diffuse interstitial opacity consistent with edema. Superimposed more coarse interstitial opacities is likely scarring. No effusion or pneumothorax. Vascular pedicle widening. IMPRESSION: CHF. Electronically Signed   By: Kathrynn Ducking.D.  On: 11/22/2015 21:07    Procedures Procedures (including critical care time)  Medications Ordered in ED Medications  nitroGLYCERIN (NITROSTAT) SL tablet 0.4 mg (not administered)  aspirin chewable tablet 324 mg (324 mg Oral Given 11/22/15 2111)     Initial Impression / Assessment and Plan / ED Course  I have reviewed the triage vital signs and the nursing notes.  Pertinent labs & imaging results that were available during my care of the patient were reviewed by me and considered in my medical decision making (see chart for details).  Clinical Course   Patient with unstable angina, increased t wave inversion on ekg, and incresed marking on cxr c.w. Pulmonary edema.  Patient hypertensive to 180s.  Iv lasix and nitro paste ordered  1- unstable angina- istat troponin normal, lab elevated at .06, plan trend troponins, consult cardiology.  Patient pain free but ekg with increased t wave inversion 2- pulmonary edema- patient not sob and lungs clear- lasix given 3- hypertension-lasix and nitro paste ordered. Discussed with Dr. Conley Rolls and he will see and admit.  Final Clinical Impressions(s) / ED Diagnoses   Final diagnoses:  NSTEMI  (non-ST elevated myocardial infarction) [facility])    New Prescriptions New Prescriptions   No medications on file     Margarita Grizzle, MD 11/22/15 2227

## 2015-11-22 NOTE — ED Notes (Signed)
CRITICAL VALUE ALERT  Critical value received:  Troponin 0.06  Date of notification:  11/22/2015  Time of notification: 2203  Critical value read back: yes  Nurse who received alert: Tenna DelaineLori Pryor Guettler, RN  MD notified (1st page):  2203  Time of first page:  2203  MD notified (2nd page):  Time of second page:  Responding MD:  Dr. Rosalia Hammersay  Time MD responded:  2203

## 2015-11-22 NOTE — ED Notes (Signed)
Got coke for family

## 2015-11-22 NOTE — ED Notes (Signed)
MD at bedside. 

## 2015-11-22 NOTE — ED Triage Notes (Signed)
Pt was recently discharged from Hospital with CHF. Pt states today she has been weak when standing and has chest pain when standing.

## 2015-11-22 NOTE — H&P (Signed)
History and Physical    Melissa [name] ZOX:[id number] DOB: May 25, 1930 DOA: 11/22/2015  Referring MD/NP/PA: Margarita Grizzle, MD PCP: Fredirick Maudlin, MD  Outpatient Specialists: None Patient coming from: Home  Chief Complaint: Chest Pain  HPI: Melissa [name] is a 82 y.o. female with medical history significant of DM type 2, CKD with Cr around 2, hyperlipidemia, HTN, A-fib which she is on coumadin, and diastolic CHF presents to the ED with complaints of CP. She admits to having breathing difficulty while walking. She currently is not in any pain while at rest. She does not smoke or drink alcohol. She denies nausea, vomiting, and abd pain.  She was recently admitted for NSTEMI, and her meds were adjusted.  She was seen in consultation with Dr Wyline Mood of cardiology, and she was treated medically.  Work up in the ER tonight showed troponin slightly elevated at 0.06 Sodium 127, potassium 5.4, BUN 36, Cr 1.98, Alkaline Phosphatase 163, troponin 0.06, and Hgb 10.5. CXR shows CHF. EKG shows inverted T-wave, though present previously, is clearly more deeply inverted. Hospitalist was asked to admit her for further evaluation.   Review of Systems: As per HPI otherwise 10 point review of systems negative.   Past Medical History:  Diagnosis Date  . Atrial fibrillation (HCC)   . DDD (degenerative disc disease), lumbar   . Essential hypertension, benign   . Mixed hyperlipidemia   . Type 2 diabetes mellitus (HCC)     Past Surgical History:  Procedure Laterality Date  . TOTAL HIP ARTHROPLASTY  01/14/03   Left     reports that she has never smoked. She has never used smokeless tobacco. She reports that she does not drink alcohol or use drugs.  No Known Allergies  Family History  Problem Relation Age of Onset  . Diabetes type II Mother   . Diabetes type II Father   . Hypertension Father     Prior to Admission medications   Medication Sig Start Date End Date Taking? Authorizing Provider    ALPRAZolam (XANAX) 0.25 MG tablet Take 0.25 mg by mouth 3 (three) times daily as needed. 11/19/15  Yes Historical Provider, MD  atorvastatin (LIPITOR) 20 MG tablet Take 20 mg by mouth daily.   Yes Historical Provider, MD  diltiazem (CARDIZEM CD) 120 MG 24 hr capsule Take 1 capsule (120 mg total) by mouth daily. 02/25/15  Yes Kari Baars, MD  glimepiride (AMARYL) 2 MG tablet Take 2 mg by mouth daily.     Yes Historical Provider, MD  hydrALAZINE (APRESOLINE) 50 MG tablet Take 1.5 tablets (75 mg total) by mouth 3 (three) times daily. Patient taking differently: Take 50 mg by mouth 3 (three) times daily.  11/05/15  Yes Jonelle Sidle, MD  HYDROcodone-acetaminophen (NORCO/VICODIN) 5-325 MG tablet Take 1 tablet by mouth 4 (four) times daily.  09/10/14  Yes Historical Provider, MD  isosorbide mononitrate (IMDUR) 30 MG 24 hr tablet Take 1 tablet (30 mg total) by mouth daily. 11/13/15  Yes Kari Baars, MD  lisinopril (PRINIVIL,ZESTRIL) 40 MG tablet Take 40 mg by mouth 2 (two) times daily.     Yes Historical Provider, MD  metFORMIN (GLUCOPHAGE) 500 MG tablet Take 500 mg by mouth 2 (two) times daily with meals.     Yes Historical Provider, MD  potassium chloride SA (K-DUR,KLOR-CON) 20 MEQ tablet Take 2 tablets (40 mEq total) by mouth 2 (two) times daily. 02/25/15  Yes Kari Baars, MD  solifenacin (VESICARE) 5 MG tablet Take 5 mg by  mouth daily.   Yes Historical Provider, MD  torsemide (DEMADEX) 20 MG tablet Take 1 tablet (20 mg total) by mouth 2 (two) times daily. 02/25/15  Yes Kari Baars, MD  TRAVATAN Z 0.004 % SOLN ophthalmic solution Place 1 drop into both eyes at bedtime.  11/08/14  Yes Historical Provider, MD  warfarin (COUMADIN) 5 MG tablet Take 1 tablet daily or as directed Patient taking differently: Take 2.5-5 mg by mouth every hour as needed. Take one tablet daily except take one-half tablet on Wednesdays and Sundays 06/15/15  Yes Jonelle Sidle, MD    Physical Exam: Vitals:   11/22/15  2100 11/22/15 2130 11/22/15 2145 11/22/15 2200  BP: 166/84 183/76  165/69  Pulse: (!) 59 66 60 (!) 47  Resp: 14 17 14 16   Temp:      TempSrc:      SpO2: 98% 95% 96% 91%  Weight:      Height:          Constitutional: NAD, calm, comfortable Vitals:   11/22/15 2100 11/22/15 2130 11/22/15 2145 11/22/15 2200  BP: 166/84 183/76  165/69  Pulse: (!) 59 66 60 (!) 47  Resp: 14 17 14 16   Temp:      TempSrc:      SpO2: 98% 95% 96% 91%  Weight:      Height:       Eyes: PERRL, lids and conjunctivae normal ENMT: Mucous membranes are moist. Posterior pharynx clear of any exudate or lesions.Normal dentition.  Neck: normal, supple, no masses, no thyromegaly Respiratory: clear to auscultation bilaterally, no wheezing, no crackles. Normal respiratory effort. No accessory muscle use.  Cardiovascular: Regular rate and rhythm, no murmurs / rubs / gallops. No extremity edema. 2+ pedal pulses. No carotid bruits.  Abdomen: no tenderness, no masses palpated. No hepatosplenomegaly. Bowel sounds positive.  Musculoskeletal: no clubbing / cyanosis. No joint deformity upper and lower extremities. Good ROM, no contractures. Normal muscle tone.  Skin: no rashes, lesions, ulcers. No induration Neurologic: CN 2-12 grossly intact. Sensation intact, DTR normal. Strength 5/5 in all 4.  Psychiatric: Normal judgment and insight. Alert and oriented x 3. Normal mood.    Labs on Admission: I have personally reviewed following labs and imaging studies  CBC:  Recent Labs Lab 11/22/15 2110  WBC 6.7  HGB 10.5*  HCT 31.0*  MCV 94.5  PLT 269   Basic Metabolic Panel:  Recent Labs Lab 11/22/15 2110  NA 127*  K 5.4*  CL 97*  CO2 20*  GLUCOSE 108*  BUN 36*  CREATININE 1.98*  CALCIUM 9.3   GFR: Estimated Creatinine Clearance: 18.9 mL/min (by C-G formula based on SCr of 1.98 mg/dL (H)). Liver Function Tests:  Recent Labs Lab 11/22/15 2110  AST 33  ALT 17  ALKPHOS 163*  BILITOT 0.8  PROT 7.3    ALBUMIN 3.8   Coagulation Profile:  Recent Labs Lab 11/22/15 2110  INR 2.19   Cardiac Enzymes:  Recent Labs Lab 11/22/15 2110  TROPONINI 0.06*   Radiological Exams on Admission: Dg Chest Portable 1 View  Result Date: 11/22/2015 CLINICAL DATA:  Chest pain and weakness EXAM: PORTABLE CHEST 1 VIEW COMPARISON:  11/10/2015 FINDINGS: Chronic cardiopericardial enlargement. There is diffuse interstitial opacity consistent with edema. Superimposed more coarse interstitial opacities is likely scarring. No effusion or pneumothorax. Vascular pedicle widening. IMPRESSION: CHF. Electronically Signed   By: Marnee Spring M.D.   On: 11/22/2015 21:07    EKG: Independently reviewed. Inverted T-wave that is more  pronounced than 11/11/15 EKG.   Assessment/Plan Active Problems:   Unstable angina (HCC)   1. Unstable angina. I She is having exertional CP with EKG suggestive of inferolateral ischemia. Will need to consult cardiolgy in consideration for cardiac catherization.  Given elevated Cr, perhaps a nuclear stress test with subsequent medical management would be reasonable.  Will defer to patient, attending and cardiology.  In the interim[facility], hold coumadin, and adjust medication as followed:  increase Lipitor and will increase Imdur to 60mg  per day.  Place on ASA while holding coumadin.  2. Diastolic CHF. Recent ECHO on 10/4 shows EF of 60-65%. Continue on lasix. Continue to monitor. 3. A-fib. EKG shows inverted T-waves. She currently takes coumadin at home. Will hold coumadin for now in case cardiology decided on heart cath.  4. Essential HTN. Elevated. Will get BP better controlled.  Monitor very carefully. 5. DM type 2. Stable. Start on SSI. 6. Hyperlipidemia. Continue statins.    DVT prophylaxis: SCD, coumadin being held.  Code Status: Full Family Communication: Daughter at bedside Disposition Plan: Discharge once improved Consults called: Cardiology Admission  status: Admit to inpatient   Melissa SirenPeter Coral Timme, MD    FACP [facility] If 7PM-7AM, please contact night-coverage [email or web address] Password [email or web address]  11/22/2015, 10:23 PM   By signing my name below, I, Melissa [name], attest that this documentation has been prepared under the direction and in the presence of Melissa SirenPeter Shaquia Berkley, MD. Electronically signed: Bobbie Stackhristopher [name], Scribe.  11/22/15, 10:30 PM

## 2015-11-23 ENCOUNTER — Encounter (HOSPITAL_COMMUNITY): Payer: Self-pay | Admitting: *Deleted

## 2015-11-23 DIAGNOSIS — I129 Hypertensive chronic kidney disease with stage 1 through stage 4 chronic kidney disease, or unspecified chronic kidney disease: Secondary | ICD-10-CM

## 2015-11-23 DIAGNOSIS — I5033 Acute on chronic diastolic (congestive) heart failure: Secondary | ICD-10-CM

## 2015-11-23 DIAGNOSIS — D500 Iron deficiency anemia secondary to blood loss (chronic): Secondary | ICD-10-CM

## 2015-11-23 LAB — BASIC METABOLIC PANEL
ANION GAP: 6 (ref 5–15)
BUN: 37 mg/dL — ABNORMAL HIGH (ref 6–20)
CO2: 26 mmol/L (ref 22–32)
Calcium: 8.7 mg/dL — ABNORMAL LOW (ref 8.9–10.3)
Chloride: 97 mmol/L — ABNORMAL LOW (ref 101–111)
Creatinine, Ser: 1.83 mg/dL — ABNORMAL HIGH (ref 0.44–1.00)
GFR, EST AFRICAN AMERICAN: 28 mL/min — AB (ref >60)
GFR, EST NON AFRICAN AMERICAN: 25 mL/min — AB (ref >60)
GLUCOSE: 67 mg/dL (ref 65–99)
POTASSIUM: 4.7 mmol/L (ref 3.5–5.1)
Sodium: 129 mmol/L — ABNORMAL LOW (ref 135–145)

## 2015-11-23 LAB — CBC
HEMATOCRIT: 28.3 % — AB (ref 36.0–46.0)
HEMATOCRIT: 30.2 % — AB (ref 36.0–46.0)
HEMOGLOBIN: 9.5 g/dL — AB (ref 12.0–15.0)
HEMOGLOBIN: 10.2 g/dL — AB (ref 12.0–15.0)
MCH: 31.9 pg (ref 26.0–34.0)
MCH: 32.2 pg (ref 26.0–34.0)
MCHC: 33.6 g/dL (ref 30.0–36.0)
MCHC: 33.8 g/dL (ref 30.0–36.0)
MCV: 95 fL (ref 78.0–100.0)
MCV: 95.3 fL (ref 78.0–100.0)
Platelets: 249 10*3/uL (ref 150–400)
Platelets: 251 10*3/uL (ref 150–400)
RBC: 2.98 MIL/uL — AB (ref 3.87–5.11)
RBC: 3.17 MIL/uL — ABNORMAL LOW (ref 3.87–5.11)
RDW: 13.7 % (ref 11.5–15.5)
RDW: 13.6 % (ref 11.5–15.5)
WBC: 5.6 10*3/uL (ref 4.0–10.5)
WBC: 6.6 10*3/uL (ref 4.0–10.5)

## 2015-11-23 LAB — PROTIME-INR
INR: 2.34
PROTHROMBIN TIME: 26.1 s — AB (ref 11.4–15.2)

## 2015-11-23 LAB — TROPONIN I
TROPONIN I: 0.07 ng/mL — AB (ref <0.03)
Troponin I: 0.07 ng/mL (ref <0.03)
Troponin I: 0.08 ng/mL (ref <0.03)

## 2015-11-23 LAB — GLUCOSE, CAPILLARY
GLUCOSE-CAPILLARY: 77 mg/dL (ref 65–99)
GLUCOSE-CAPILLARY: 71 mg/dL (ref 65–99)
GLUCOSE-CAPILLARY: 164 mg/dL — AB (ref 65–99)
GLUCOSE-CAPILLARY: 129 mg/dL — AB (ref 65–99)
GLUCOSE-CAPILLARY: 104 mg/dL — AB (ref 65–99)
Glucose-Capillary: 85 mg/dL (ref 65–99)

## 2015-11-23 LAB — HEPARIN LEVEL (UNFRACTIONATED): HEPARIN UNFRACTIONATED: 0.38 [IU]/mL (ref 0.30–0.70)

## 2015-11-23 MED ORDER — INSULIN ASPART 100 UNIT/ML ~~LOC~~ SOLN
0.0000 [IU] | SUBCUTANEOUS | Status: DC
  Administered 2015-11-23: 1 [IU] via SUBCUTANEOUS
  Administered 2015-11-23 – 2015-11-24 (×3): 2 [IU] via SUBCUTANEOUS
  Administered 2015-11-24 – 2015-11-25 (×3): 1 [IU] via SUBCUTANEOUS
  Administered 2015-11-25: 2 [IU] via SUBCUTANEOUS
  Administered 2015-11-26 (×2): 1 [IU] via SUBCUTANEOUS
  Administered 2015-11-26 – 2015-11-27 (×2): 3 [IU] via SUBCUTANEOUS
  Administered 2015-11-27 (×2): 2 [IU] via SUBCUTANEOUS
  Administered 2015-11-27 – 2015-11-28 (×3): 1 [IU] via SUBCUTANEOUS
  Administered 2015-11-28 (×2): 2 [IU] via SUBCUTANEOUS
  Administered 2015-11-28 – 2015-11-29 (×2): 1 [IU] via SUBCUTANEOUS
  Administered 2015-11-29 – 2015-11-30 (×5): 2 [IU] via SUBCUTANEOUS
  Administered 2015-11-30 (×2): 1 [IU] via SUBCUTANEOUS
  Administered 2015-11-30: 2 [IU] via SUBCUTANEOUS
  Administered 2015-11-30: 5 [IU] via SUBCUTANEOUS
  Administered 2015-12-01: 1 [IU] via SUBCUTANEOUS
  Administered 2015-12-01: 3 [IU] via SUBCUTANEOUS
  Administered 2015-12-01: 1 [IU] via SUBCUTANEOUS
  Administered 2015-12-01: 2 [IU] via SUBCUTANEOUS
  Administered 2015-12-01 – 2015-12-02 (×2): 1 [IU] via SUBCUTANEOUS
  Administered 2015-12-02: 3 [IU] via SUBCUTANEOUS
  Administered 2015-12-02: 2 [IU] via SUBCUTANEOUS
  Administered 2015-12-02: 1 [IU] via SUBCUTANEOUS
  Administered 2015-12-03: 3 [IU] via SUBCUTANEOUS
  Administered 2015-12-03: 2 [IU] via SUBCUTANEOUS
  Administered 2015-12-03: 3 [IU] via SUBCUTANEOUS
  Administered 2015-12-03: 1 [IU] via SUBCUTANEOUS
  Administered 2015-12-03 – 2015-12-04 (×4): 2 [IU] via SUBCUTANEOUS
  Administered 2015-12-04: 5 [IU] via SUBCUTANEOUS
  Administered 2015-12-05 (×2): 2 [IU] via SUBCUTANEOUS

## 2015-11-23 MED ORDER — ISOSORBIDE MONONITRATE ER 60 MG PO TB24
60.0000 mg | ORAL_TABLET | Freq: Every day | ORAL | Status: DC
  Administered 2015-11-23 – 2015-12-05 (×12): 60 mg via ORAL
  Filled 2015-11-23 (×12): qty 1

## 2015-11-23 MED ORDER — SODIUM CHLORIDE 0.9% FLUSH
3.0000 mL | Freq: Two times a day (BID) | INTRAVENOUS | Status: DC
  Administered 2015-11-23 – 2015-12-05 (×11): 3 mL via INTRAVENOUS

## 2015-11-23 MED ORDER — HEPARIN (PORCINE) IN NACL 100-0.45 UNIT/ML-% IJ SOLN
900.0000 [IU]/h | INTRAMUSCULAR | Status: DC
Start: 2015-11-23 — End: 2015-11-26
  Administered 2015-11-23 – 2015-11-25 (×2): 900 [IU]/h via INTRAVENOUS
  Filled 2015-11-23 (×3): qty 250

## 2015-11-23 MED ORDER — ASPIRIN EC 81 MG PO TBEC
81.0000 mg | DELAYED_RELEASE_TABLET | Freq: Every day | ORAL | Status: DC
  Administered 2015-11-23 – 2015-12-05 (×12): 81 mg via ORAL
  Filled 2015-11-23 (×13): qty 1

## 2015-11-23 MED ORDER — NITROGLYCERIN IN D5W 200-5 MCG/ML-% IV SOLN
5.0000 ug/min | INTRAVENOUS | Status: DC
  Administered 2015-11-23: 5 ug/min via INTRAVENOUS
  Filled 2015-11-23: qty 250

## 2015-11-23 MED ORDER — DARIFENACIN HYDROBROMIDE ER 7.5 MG PO TB24
7.5000 mg | ORAL_TABLET | Freq: Every day | ORAL | Status: DC
  Administered 2015-11-23 – 2015-12-05 (×12): 7.5 mg via ORAL
  Filled 2015-11-23 (×13): qty 1

## 2015-11-23 MED ORDER — HYDRALAZINE HCL 25 MG PO TABS: 75.0000 mg | ORAL_TABLET | Freq: Three times a day (TID) | ORAL | Status: DC

## 2015-11-23 MED ORDER — DILTIAZEM HCL ER COATED BEADS 120 MG PO CP24
120.0000 mg | ORAL_CAPSULE | Freq: Every day | ORAL | Status: DC
  Administered 2015-11-23 – 2015-11-26 (×4): 120 mg via ORAL
  Filled 2015-11-23 (×4): qty 1

## 2015-11-23 MED ORDER — TORSEMIDE 20 MG PO TABS
20.0000 mg | ORAL_TABLET | Freq: Two times a day (BID) | ORAL | Status: DC
  Administered 2015-11-23 – 2015-11-25 (×6): 20 mg via ORAL
  Filled 2015-11-23 (×6): qty 1

## 2015-11-23 MED ORDER — ACETAMINOPHEN 325 MG PO TABS
650.0000 mg | ORAL_TABLET | ORAL | Status: DC | PRN
  Administered 2015-11-23: 650 mg via ORAL
  Filled 2015-11-23: qty 2

## 2015-11-23 MED ORDER — ALPRAZOLAM 0.25 MG PO TABS: 0.2500 mg | ORAL_TABLET | Freq: Three times a day (TID) | ORAL | Status: DC | PRN

## 2015-11-23 MED ORDER — ATORVASTATIN CALCIUM 40 MG PO TABS
40.0000 mg | ORAL_TABLET | Freq: Every day | ORAL | Status: DC
  Administered 2015-11-23 – 2015-12-05 (×12): 40 mg via ORAL
  Filled 2015-11-23 (×12): qty 1

## 2015-11-23 MED ORDER — ONDANSETRON HCL 4 MG/2ML IJ SOLN: 4.0000 mg | Freq: Four times a day (QID) | INTRAMUSCULAR | Status: DC | PRN

## 2015-11-23 MED ORDER — LISINOPRIL 10 MG PO TABS: 40.0000 mg | ORAL_TABLET | Freq: Two times a day (BID) | ORAL | Status: DC

## 2015-11-23 MED ORDER — PANTOPRAZOLE SODIUM 40 MG PO TBEC
40.0000 mg | DELAYED_RELEASE_TABLET | Freq: Every day | ORAL | Status: DC
  Administered 2015-11-24 – 2015-12-05 (×12): 40 mg via ORAL
  Filled 2015-11-23 (×13): qty 1

## 2015-11-23 MED ORDER — HYDRALAZINE HCL 50 MG PO TABS
100.0000 mg | ORAL_TABLET | Freq: Three times a day (TID) | ORAL | Status: DC
  Administered 2015-11-23 – 2015-12-05 (×32): 100 mg via ORAL
  Filled 2015-11-23 (×4): qty 2
  Filled 2015-11-23 (×2): qty 4
  Filled 2015-11-23 (×8): qty 2
  Filled 2015-11-23: qty 4
  Filled 2015-11-23 (×23): qty 2

## 2015-11-23 MED ORDER — LISINOPRIL 20 MG PO TABS
40.0000 mg | ORAL_TABLET | Freq: Every day | ORAL | Status: DC
  Administered 2015-11-23 – 2015-11-30 (×8): 40 mg via ORAL
  Filled 2015-11-23: qty 4
  Filled 2015-11-23: qty 1
  Filled 2015-11-23: qty 2
  Filled 2015-11-23: qty 1
  Filled 2015-11-23 (×3): qty 2
  Filled 2015-11-23: qty 1

## 2015-11-23 MED ORDER — ONDANSETRON HCL 4 MG PO TABS: 4.0000 mg | ORAL_TABLET | Freq: Four times a day (QID) | ORAL | Status: DC | PRN

## 2015-11-23 MED ORDER — LATANOPROST 0.005 % OP SOLN
1.0000 [drp] | Freq: Every day | OPHTHALMIC | Status: DC
  Administered 2015-11-24 – 2015-12-04 (×10): 1 [drp] via OPHTHALMIC
  Filled 2015-11-23 (×3): qty 2.5

## 2015-11-23 NOTE — Progress Notes (Signed)
ANTICOAGULATION CONSULT NOTE  Pharmacy Consult for Heparin Indication: chest pain/ACS and atrial fibrillation  No Known Allergies  Patient Measurements: Height: 5\' 4"  (162.6 cm) Weight: 157 lb 10.1 oz (71.5 kg) IBW/kg (Calculated) : 54.7 HEPARIN DW (KG): 69.3   Vital Signs: Temp: 97.6 F (36.4 C) (10/16 0743) Temp Source: Oral (10/16 0743) BP: 119/70 (10/16 0743) Pulse Rate: 64 (10/16 0743)  Labs:  Recent Labs  11/22/15 2110 11/23/15 0052 11/23/15 0602  HGB 10.5*  --  9.5*  HCT 31.0*  --  28.3*  PLT 269  --  249  APTT 45*  --   --   LABPROT 24.7*  --  26.1*  INR 2.19  --  2.34  CREATININE 1.98*  --  1.83*  TROPONINI 0.06* 0.07* 0.08*   Estimated Creatinine Clearance: 23 mL/min (by C-G formula based on SCr of 1.83 mg/dL (H)).  Medical History: Past Medical History:  Diagnosis Date  . Atrial fibrillation (HCC)   . CHF (congestive heart failure) (HCC)   . DDD (degenerative disc disease), lumbar   . Essential hypertension, benign   . Mixed hyperlipidemia   . Type 2 diabetes mellitus (HCC)    Medications:  Prescriptions Prior to Admission  Medication Sig Dispense Refill Last Dose  . ALPRAZolam (XANAX) 0.25 MG tablet Take 0.25 mg by mouth 3 (three) times daily as needed.   11/21/2015 at Unknown time  . atorvastatin (LIPITOR) 20 MG tablet Take 20 mg by mouth daily.   11/22/2015 at Unknown time  . diltiazem (CARDIZEM CD) 120 MG 24 hr capsule Take 1 capsule (120 mg total) by mouth daily. 30 capsule 12 11/22/2015 at Unknown time  . glimepiride (AMARYL) 2 MG tablet Take 2 mg by mouth daily.     11/22/2015 at Unknown time  . hydrALAZINE (APRESOLINE) 50 MG tablet Take 1.5 tablets (75 mg total) by mouth 3 (three) times daily. (Patient taking differently: Take 50 mg by mouth 3 (three) times daily. ) 45 tablet 6 11/22/2015 at Unknown time  . HYDROcodone-acetaminophen (NORCO/VICODIN) 5-325 MG tablet Take 1 tablet by mouth 4 (four) times daily.    11/22/2015 at Unknown time  .  isosorbide mononitrate (IMDUR) 30 MG 24 hr tablet Take 1 tablet (30 mg total) by mouth daily. 30 tablet 12 11/22/2015 at Unknown time  . lisinopril (PRINIVIL,ZESTRIL) 40 MG tablet Take 40 mg by mouth 2 (two) times daily.     11/22/2015 at Unknown time  . metFORMIN (GLUCOPHAGE) 500 MG tablet Take 500 mg by mouth 2 (two) times daily with meals.     11/22/2015 at Unknown time  . potassium chloride SA (K-DUR,KLOR-CON) 20 MEQ tablet Take 2 tablets (40 mEq total) by mouth 2 (two) times daily. 120 tablet 5 11/22/2015 at Unknown time  . solifenacin (VESICARE) 5 MG tablet Take 5 mg by mouth daily.   11/22/2015 at Unknown time  . torsemide (DEMADEX) 20 MG tablet Take 1 tablet (20 mg total) by mouth 2 (two) times daily. 60 tablet 12 11/22/2015 at Unknown time  . TRAVATAN Z 0.004 % SOLN ophthalmic solution Place 1 drop into both eyes at bedtime.    11/21/2015 at Unknown time  . warfarin (COUMADIN) 5 MG tablet Take 1 tablet daily or as directed (Patient taking differently: Take 2.5-5 mg by mouth every hour as needed. Take one tablet daily except take one-half tablet on Wednesdays and Sundays) 45 tablet 3 11/22/2015 at 1900   Assessment: Okay for protocol, elevated INR.  Warfarin on hold.  Hg  9.5.  Goal of Therapy:  Heparin level 0.3-0.7 units/ml Monitor platelets by anticoagulation protocol: Yes   Plan:  Start heparin infusion at 900 units/hr Check anti-Xa level in 8 hours and daily while on heparin Continue to monitor H&H and platelets  Melissa [name], Melissa [name] R 11/23/2015,9:44 AM

## 2015-11-23 NOTE — Progress Notes (Signed)
Subjective: She was admitted last night with chest discomfort. She was in the hospital earlier this month with a similar problem. She says she doesn't have any chest pain now. She is not short of breath. No PND or orthopnea. No diaphoresis nausea or vomiting. No swelling of her legs  Objective: Vital signs in last 24 hours: Temp:  [97.6 F (36.4 C)-98.2 F (36.8 C)] 97.6 F (36.4 C) (10/16 0743) Pulse Rate:  [47-75] 64 (10/16 0743) Resp:  [12-18] 13 (10/16 0743) BP: (119-183)/(61-100) 119/70 (10/16 0743) SpO2:  [91 %-100 %] 100 % (10/16 0743) Weight:  [64.9 kg (143 lb)-71.5 kg (157 lb 10.1 oz)] 71.5 kg (157 lb 10.1 oz) (10/16 0045) Weight change:  Last BM Date: 11/22/15  Intake/Output from previous day: 10/15 0701 - 10/16 0700 In: -  Out: 750 [Urine:750]  PHYSICAL EXAM General appearance: alert, cooperative and Very hard of hearing Resp: clear to auscultation bilaterally Cardio: Her heart is irregularly irregular and she has a systolic heart murmur GI: soft, non-tender; bowel sounds normal; no masses,  no organomegaly Extremities: Trace edema As mentioned she is very hard of hearing. Pupils are reactive. Mucous membranes are moist. Prior to Admission:  Prescriptions Prior to Admission  Medication Sig Dispense Refill Last Dose  . ALPRAZolam (XANAX) 0.25 MG tablet Take 0.25 mg by mouth 3 (three) times daily as needed.   11/21/2015 at Unknown time  . atorvastatin (LIPITOR) 20 MG tablet Take 20 mg by mouth daily.   11/22/2015 at Unknown time  . diltiazem (CARDIZEM CD) 120 MG 24 hr capsule Take 1 capsule (120 mg total) by mouth daily. 30 capsule 12 11/22/2015 at Unknown time  . glimepiride (AMARYL) 2 MG tablet Take 2 mg by mouth daily.     11/22/2015 at Unknown time  . hydrALAZINE (APRESOLINE) 50 MG tablet Take 1.5 tablets (75 mg total) by mouth 3 (three) times daily. (Patient taking differently: Take 50 mg by mouth 3 (three) times daily. ) 45 tablet 6 11/22/2015 at Unknown time  .  HYDROcodone-acetaminophen (NORCO/VICODIN) 5-325 MG tablet Take 1 tablet by mouth 4 (four) times daily.    11/22/2015 at Unknown time  . isosorbide mononitrate (IMDUR) 30 MG 24 hr tablet Take 1 tablet (30 mg total) by mouth daily. 30 tablet 12 11/22/2015 at Unknown time  . lisinopril (PRINIVIL,ZESTRIL) 40 MG tablet Take 40 mg by mouth 2 (two) times daily.     11/22/2015 at Unknown time  . metFORMIN (GLUCOPHAGE) 500 MG tablet Take 500 mg by mouth 2 (two) times daily with meals.     11/22/2015 at Unknown time  . potassium chloride SA (K-DUR,KLOR-CON) 20 MEQ tablet Take 2 tablets (40 mEq total) by mouth 2 (two) times daily. 120 tablet 5 11/22/2015 at Unknown time  . solifenacin (VESICARE) 5 MG tablet Take 5 mg by mouth daily.   11/22/2015 at Unknown time  . torsemide (DEMADEX) 20 MG tablet Take 1 tablet (20 mg total) by mouth 2 (two) times daily. 60 tablet 12 11/22/2015 at Unknown time  . TRAVATAN Z 0.004 % SOLN ophthalmic solution Place 1 drop into both eyes at bedtime.    11/21/2015 at Unknown time  . warfarin (COUMADIN) 5 MG tablet Take 1 tablet daily or as directed (Patient taking differently: Take 2.5-5 mg by mouth every hour as needed. Take one tablet daily except take one-half tablet on Wednesdays and Sundays) 45 tablet 3 11/22/2015 at 1900   Scheduled: . aspirin EC  81 mg Oral Daily  . atorvastatin  40 mg Oral Daily  . darifenacin  7.5 mg Oral Daily  . diltiazem  120 mg Oral Daily  . hydrALAZINE  75 mg Oral TID  . insulin aspart  0-9 Units Subcutaneous Q4H  . isosorbide mononitrate  60 mg Oral Daily  . latanoprost  1 drop Both Eyes QHS  . lisinopril  40 mg Oral BID  . nitroGLYCERIN  0.4 mg Transdermal Daily  . pantoprazole  40 mg Oral Q0600  . sodium chloride flush  3 mL Intravenous Q12H  . torsemide  20 mg Oral BID   Continuous:  PRN:acetaminophen, ALPRAZolam, nitroGLYCERIN, ondansetron **OR** ondansetron (ZOFRAN) IV Lab Results:  Results for orders placed or performed during the  hospital encounter of 11/22/15 (from the past 48 hour(s))  CBC     Status: Abnormal   Collection Time: 11/22/15  9:10 PM  Result Value Ref Range   WBC 6.7 4.0 - 10.5 K/uL   RBC 3.28 (L) 3.87 - 5.11 MIL/uL   Hemoglobin 10.5 (L) 12.0 - 15.0 g/dL   HCT 31.0 (L) 36.0 - 46.0 %   MCV 94.5 78.0 - 100.0 fL   MCH 32.0 26.0 - 34.0 pg   MCHC 33.9 30.0 - 36.0 g/dL   RDW 13.7 11.5 - 15.5 %   Platelets 269 150 - 400 K/uL  APTT     Status: Abnormal   Collection Time: 11/22/15  9:10 PM  Result Value Ref Range   aPTT 45 (H) 24 - 36 seconds    Comment:        IF BASELINE aPTT IS ELEVATED, SUGGEST PATIENT RISK ASSESSMENT BE USED TO DETERMINE APPROPRIATE ANTICOAGULANT THERAPY.   Comprehensive metabolic panel     Status: Abnormal   Collection Time: 11/22/15  9:10 PM  Result Value Ref Range   Sodium 127 (L) 135 - 145 mmol/L   Potassium 5.4 (H) 3.5 - 5.1 mmol/L   Chloride 97 (L) 101 - 111 mmol/L   CO2 20 (L) 22 - 32 mmol/L   Glucose, Bld 108 (H) 65 - 99 mg/dL   BUN 36 (H) 6 - 20 mg/dL   Creatinine, Ser 1.98 (H) 0.44 - 1.00 mg/dL   Calcium 9.3 8.9 - 10.3 mg/dL   Total Protein 7.3 6.5 - 8.1 g/dL   Albumin 3.8 3.5 - 5.0 g/dL   AST 33 15 - 41 U/L   ALT 17 14 - 54 U/L   Alkaline Phosphatase 163 (H) 38 - 126 U/L   Total Bilirubin 0.8 0.3 - 1.2 mg/dL   GFR calc non Af Amer 22 (L) >60 mL/min   GFR calc Af Amer 26 (L) >60 mL/min    Comment: (NOTE) The eGFR has been calculated using the CKD EPI equation. This calculation has not been validated in all clinical situations. eGFR's persistently <60 mL/min signify possible Chronic Kidney Disease.    Anion gap 10 5 - 15  Protime-INR     Status: Abnormal   Collection Time: 11/22/15  9:10 PM  Result Value Ref Range   Prothrombin Time 24.7 (H) 11.4 - 15.2 seconds   INR 2.19   Troponin I     Status: Abnormal   Collection Time: 11/22/15  9:10 PM  Result Value Ref Range   Troponin I 0.06 (HH) <0.03 ng/mL    Comment: CRITICAL RESULT CALLED TO, READ BACK  BY AND VERIFIED WITH:  HUTCHENS,L @ 2203 ON 11/22/15 BY JUW   I-stat troponin, ED     Status: None   Collection Time:   11/22/15  9:21 PM  Result Value Ref Range   Troponin i, poc 0.03 0.00 - 0.08 ng/mL   Comment 3            Comment: Due to the release kinetics of cTnI, a negative result within the first hours of the onset of symptoms does not rule out myocardial infarction with certainty. If myocardial infarction is still suspected, repeat the test at appropriate intervals.   Troponin I     Status: Abnormal   Collection Time: 11/23/15 12:52 AM  Result Value Ref Range   Troponin I 0.07 (HH) <0.03 ng/mL    Comment: CRITICAL VALUE NOTED.  VALUE IS CONSISTENT WITH PREVIOUSLY REPORTED AND CALLED VALUE.  Glucose, capillary     Status: None   Collection Time: 11/23/15  4:04 AM  Result Value Ref Range   Glucose-Capillary 77 65 - 99 mg/dL   Comment 1 Notify RN    Comment 2 Document in Chart   Troponin I     Status: Abnormal   Collection Time: 11/23/15  6:02 AM  Result Value Ref Range   Troponin I 0.08 (HH) <0.03 ng/mL    Comment: CRITICAL RESULT CALLED TO, READ BACK BY AND VERIFIED WITH: FOLEY,B AT 7:50AM ON 11/23/15 BY FESTERMAN,C   Basic metabolic panel     Status: Abnormal   Collection Time: 11/23/15  6:02 AM  Result Value Ref Range   Sodium 129 (L) 135 - 145 mmol/L   Potassium 4.7 3.5 - 5.1 mmol/L   Chloride 97 (L) 101 - 111 mmol/L   CO2 26 22 - 32 mmol/L   Glucose, Bld 67 65 - 99 mg/dL   BUN 37 (H) 6 - 20 mg/dL   Creatinine, Ser 1.83 (H) 0.44 - 1.00 mg/dL   Calcium 8.7 (L) 8.9 - 10.3 mg/dL   GFR calc non Af Amer 25 (L) >60 mL/min   GFR calc Af Amer 28 (L) >60 mL/min    Comment: (NOTE) The eGFR has been calculated using the CKD EPI equation. This calculation has not been validated in all clinical situations. eGFR's persistently <60 mL/min signify possible Chronic Kidney Disease.    Anion gap 6 5 - 15  CBC     Status: Abnormal   Collection Time: 11/23/15  6:02 AM   Result Value Ref Range   WBC 5.6 4.0 - 10.5 K/uL   RBC 2.98 (L) 3.87 - 5.11 MIL/uL   Hemoglobin 9.5 (L) 12.0 - 15.0 g/dL   HCT 28.3 (L) 36.0 - 46.0 %   MCV 95.0 78.0 - 100.0 fL   MCH 31.9 26.0 - 34.0 pg   MCHC 33.6 30.0 - 36.0 g/dL   RDW 13.7 11.5 - 15.5 %   Platelets 249 150 - 400 K/uL  Protime-INR     Status: Abnormal   Collection Time: 11/23/15  6:02 AM  Result Value Ref Range   Prothrombin Time 26.1 (H) 11.4 - 15.2 seconds   INR 2.34   Glucose, capillary     Status: None   Collection Time: 11/23/15  7:46 AM  Result Value Ref Range   Glucose-Capillary 71 65 - 99 mg/dL    ABGS No results for input(s): PHART, PO2ART, TCO2, HCO3 in the last 72 hours.  Invalid input(s): PCO2 CULTURES No results found for this or any previous visit (from the past 240 hour(s)). Studies/Results: Dg Chest Portable 1 View  Result Date: 11/22/2015 CLINICAL DATA:  Chest pain and weakness EXAM: PORTABLE CHEST 1 VIEW COMPARISON:  11/10/2015   FINDINGS: Chronic cardiopericardial enlargement. There is diffuse interstitial opacity consistent with edema. Superimposed more coarse interstitial opacities is likely scarring. No effusion or pneumothorax. Vascular pedicle widening. IMPRESSION: CHF. Electronically Signed   By: Jonathon  Watts M.D.   On: 11/22/2015 21:07    Medications:  Prior to Admission:  Prescriptions Prior to Admission  Medication Sig Dispense Refill Last Dose  . ALPRAZolam (XANAX) 0.25 MG tablet Take 0.25 mg by mouth 3 (three) times daily as needed.   11/21/2015 at Unknown time  . atorvastatin (LIPITOR) 20 MG tablet Take 20 mg by mouth daily.   11/22/2015 at Unknown time  . diltiazem (CARDIZEM CD) 120 MG 24 hr capsule Take 1 capsule (120 mg total) by mouth daily. 30 capsule 12 11/22/2015 at Unknown time  . glimepiride (AMARYL) 2 MG tablet Take 2 mg by mouth daily.     11/22/2015 at Unknown time  . hydrALAZINE (APRESOLINE) 50 MG tablet Take 1.5 tablets (75 mg total) by mouth 3 (three) times  daily. (Patient taking differently: Take 50 mg by mouth 3 (three) times daily. ) 45 tablet 6 11/22/2015 at Unknown time  . HYDROcodone-acetaminophen (NORCO/VICODIN) 5-325 MG tablet Take 1 tablet by mouth 4 (four) times daily.    11/22/2015 at Unknown time  . isosorbide mononitrate (IMDUR) 30 MG 24 hr tablet Take 1 tablet (30 mg total) by mouth daily. 30 tablet 12 11/22/2015 at Unknown time  . lisinopril (PRINIVIL,ZESTRIL) 40 MG tablet Take 40 mg by mouth 2 (two) times daily.     11/22/2015 at Unknown time  . metFORMIN (GLUCOPHAGE) 500 MG tablet Take 500 mg by mouth 2 (two) times daily with meals.     11/22/2015 at Unknown time  . potassium chloride SA (K-DUR,KLOR-CON) 20 MEQ tablet Take 2 tablets (40 mEq total) by mouth 2 (two) times daily. 120 tablet 5 11/22/2015 at Unknown time  . solifenacin (VESICARE) 5 MG tablet Take 5 mg by mouth daily.   11/22/2015 at Unknown time  . torsemide (DEMADEX) 20 MG tablet Take 1 tablet (20 mg total) by mouth 2 (two) times daily. 60 tablet 12 11/22/2015 at Unknown time  . TRAVATAN Z 0.004 % SOLN ophthalmic solution Place 1 drop into both eyes at bedtime.    11/21/2015 at Unknown time  . warfarin (COUMADIN) 5 MG tablet Take 1 tablet daily or as directed (Patient taking differently: Take 2.5-5 mg by mouth every hour as needed. Take one tablet daily except take one-half tablet on Wednesdays and Sundays) 45 tablet 3 11/22/2015 at 1900   Scheduled: . aspirin EC  81 mg Oral Daily  . atorvastatin  40 mg Oral Daily  . darifenacin  7.5 mg Oral Daily  . diltiazem  120 mg Oral Daily  . hydrALAZINE  75 mg Oral TID  . insulin aspart  0-9 Units Subcutaneous Q4H  . isosorbide mononitrate  60 mg Oral Daily  . latanoprost  1 drop Both Eyes QHS  . lisinopril  40 mg Oral BID  . nitroGLYCERIN  0.4 mg Transdermal Daily  . pantoprazole  40 mg Oral Q0600  . sodium chloride flush  3 mL Intravenous Q12H  . torsemide  20 mg Oral BID   Continuous:  PRN:acetaminophen, ALPRAZolam,  nitroGLYCERIN, ondansetron **OR** ondansetron (ZOFRAN) IV  Assesment: She was admitted with chest pain. She had a similar admission earlier this month. She says she is better. Her troponin level is elevated. Her echocardiogram last admission showed good systolic heart function.  She has other medical problems including   hypertension which is well controlled, heart failure which appears to be stable at this point  She has diabetes and is on sliding scale  She is chronically anticoagulated which is being held right now  She has reflux but no symptoms Active Problems:   Unstable angina (HCC)    Plan: No change in treatments she has improved. Cardiology consultation and then decisions about what needs to be done from here    LOS: 1 day   , L 11/23/2015, 8:08 AM  

## 2015-11-23 NOTE — Progress Notes (Signed)
Report was given to [facility]. Patient is currently stable, denies any chest pain. Family is at the bedside and was informed about [facility]. Ready to be transported.

## 2015-11-23 NOTE — Progress Notes (Signed)
NTG started at 5 mcg per orders, pain 8 on pain scale.

## 2015-11-23 NOTE — Progress Notes (Signed)
Report was given to Piedad Climesarling, Charity fundraiserN.

## 2015-11-23 NOTE — Progress Notes (Addendum)
Transferred to ICU-07 in stable condition with oxygen, telemetry monitor, via w/c, reported to M. Reuel Boomaniel, RN.

## 2015-11-23 NOTE — Progress Notes (Signed)
Tried to give report to Francesco Sorarling, 3w. RN is currently unavailable. Awaiting the call back.

## 2015-11-23 NOTE — Consult Note (Signed)
CARDIOLOGY CONSULT NOTE   Patient ID: Melissa [name] MRN: [id number] DOB/AGE: September 01, 1930 82 y.o.  Admit Date: 11/22/2015 Referring Physician: Kari BaarsHawkins, Edward MD Primary Physician: Fredirick MaudlinHAWKINS,EDWARD L, MD Consulting Cardiologist: Prentice DockerKoneswaran, Suresh MD Primary Cardiologist: Nona DellMcDowell, Samuel MD Reason for Consultation: Chest Pain  Clinical Summary [name] is a 82 y.o.female with known history of hypertension, chronic LE edema, pulmonary hypertension, chronic atrial fib, CHADS VASC Score of 5 on coumadin, chronic chest pain. She was seen while hospitalized on November 10 2015, in the setting of NSTEMI. She had echocardiogram with LVEF 60-65% range with mid to apical anteroseptal akinesis consistent with underlying ischemic heart disease. of She was treated medically, increase in Imdur,  but cardiac catheterization was discussed by Dr. Diona BrownerMcDowell if she continued to be symptomatic.    She presented to the ER with complaints of recurrent chest pain and dyspnea.  She reports that each time she gets up to walk she has chest discomfort. He goes away with rest. She states that she increases her effort with walking the chest pain becomes worse and she has to stop and sit down. She also has some associated dyspnea with the discomfort. She recovers quickly at rest.  On arrival to the emergency room blood pressure 169/61, heart rate 52, O2 sat 99% she was afebrile. She was found to be mildly anemic with a hemoglobin of 10.5, hematocrit 31.0, platelets 269, white blood cells 6.7. She was found to be hyponatremic with sodium of 127, potassium was elevated at 5.4, chloride 97, creatinine 1.98. PTT 24.7, INR 2.19. Troponin was elevated at 0.06, with subsequent troponin 0.07 0.08 respectively. EKG revealed sinus rhythm with T-wave inversion inferior and anterior laterally. T-wave inversion is new finding. Chest x-ray revealed diffuse interstitial opacity consistent with edema. She was treated with aspirin,  Lasix, nitroglycerin transdermal patch.  Labs this morning reveal worsening anemia with hemoglobin of 9.5 hematocrit 28.3. Sodium slightly improved at 129, creatinine 1.83. She is currently pain-free, and comfortable. Coumadin has been placed on hold in anticipation of cardiac catheterization. This morning's INR is 2.34.   No Known Allergies  Medications Scheduled Medications: . aspirin EC  81 mg Oral Daily  . atorvastatin  40 mg Oral Daily  . darifenacin  7.5 mg Oral Daily  . diltiazem  120 mg Oral Daily  . hydrALAZINE  75 mg Oral TID  . insulin aspart  0-9 Units Subcutaneous Q4H  . isosorbide mononitrate  60 mg Oral Daily  . latanoprost  1 drop Both Eyes QHS  . lisinopril  40 mg Oral BID  . nitroGLYCERIN  0.4 mg Transdermal Daily  . pantoprazole  40 mg Oral Q0600  . sodium chloride flush  3 mL Intravenous Q12H  . torsemide  20 mg Oral BID     Infusions:     PRN Medications:   Past Medical History:  Diagnosis Date  . Atrial fibrillation (HCC)   . CHF (congestive heart failure) (HCC)   . DDD (degenerative disc disease), lumbar   . Essential hypertension, benign   . Mixed hyperlipidemia   . Type 2 diabetes mellitus (HCC)     Past Surgical History:  Procedure Laterality Date  . TOTAL HIP ARTHROPLASTY  01/14/03   Left    Family History  Problem Relation Age of Onset  . Diabetes type II Mother   . Diabetes type II Father   . Hypertension Father     Social History Ms. Arnott reports that she has never smoked. She has  never used smokeless tobacco. Ms. Reinhardt reports that she does not drink alcohol.  Review of Systems Complete review of systems are found to be negative unless outlined in H&P above.  Physical Examination Blood pressure 119/70, pulse 64, temperature 97.6 F (36.4 C), temperature source Oral, resp. rate 13, height 5\' 4"  (1.626 m), weight 157 lb 10.1 oz (71.5 kg), SpO2 100 %.  Intake/Output Summary (Last 24 hours) at 11/23/15 0818 Last data  filed at 11/23/15 0600  Gross per 24 hour  Intake                0 ml  Output              750 ml  Net             -750 ml    Telemetry: A fib, PVCs, heart rate in the 60s  GEN:No acute distress. HEENT: Conjunctiva and lids normal, oropharynx clear with moist mucosa. Neck: Supple, positive elevated JVP, no carotid bruits, no thyromegaly. Lungs: Clear to auscultation in the upper lobes, diminished in the bases, nonlabored breathing at rest, O2 via nasal cannula. Cardiac: Irregular rate and rhythm, no S3 or significant systolic murmur, no pericardial rub. Abdomen: Soft, nontender, no hepatomegaly, bowel sounds present, no guarding or rebound. Extremities: None pitting edema, distal pulses 2+. Skin: Warm and dry. Musculoskeletal: No kyphosis. Neuropsychiatric: Alert and oriented x3, affect grossly appropriate. Hard of hearing  Prior Cardiac Testing/Procedures 1. Echocardiogram 02/18/2015: Study Conclusions  Left ventricle: The cavity size was normal. There was moderate concentric hypertrophy. Systolic function was normal. The estimated ejection fraction was in the range of 60% to 65%. The study was not technically sufficient to allow evaluation of LV diastolic dysfunction due to atrial fibrillation. - Ventricular septum: The contour showed diastolic flattening and systolic flattening. These changes are consistent with RV volume and pressure overload. - Aortic valve: Moderately calcified annulus. Trileaflet; mildly calcified leaflets. There was no stenosis. - Mitral valve: There was mild to moderate regurgitation. - Left atrium: The atrium was severely dilated. - Right atrium: The atrium was severely dilated. Central venous pressure (est): 8 mm Hg. - Atrial septum: The septum bowed from left to right, consistent with increased left atrial pressure. - Tricuspid valve: There was moderate-severe regurgitation. - Pulmonic valve: There was mild to moderate  regurgitation. - Pulmonary arteries: Systolic pressure was severely increased. PA peak pressure: 75 mm Hg (S). - Systemic veins: IVC dilated with normal respiratory variation. - Pericardium, extracardiac: A trivial pericardial effusion was identified.  NM Stress Test 09/18/2008  Probably negative  pharmacologic stress nuclear myocardial study revealing no significant stress-induced EKG abnormalities, normal left ventricular size and normal overall left ventricular systolic function.  By scintigraphic imaging, there was a small defect consistent with prominent physiologic apical thinning with superimposed breast attenuation.  The possibility of minor distal inferolateral and apical scarring cannot be unequivocally excluded. Other findings as noted.   Lab Results  Basic Metabolic Panel:  Recent Labs Lab 11/22/15 2110 11/23/15 0602  NA 127* 129*  K 5.4* 4.7  CL 97* 97*  CO2 20* 26  GLUCOSE 108* 67  BUN 36* 37*  CREATININE 1.98* 1.83*  CALCIUM 9.3 8.7*    Liver Function Tests:  Recent Labs Lab 11/22/15 2110  AST 33  ALT 17  ALKPHOS 163*  BILITOT 0.8  PROT 7.3  ALBUMIN 3.8    CBC:  Recent Labs Lab 11/22/15 2110 11/23/15 0602  WBC 6.7 5.6  HGB 10.5* 9.5*  HCT 31.0* 28.3*  MCV 94.5 95.0  PLT 269 249    Cardiac Enzymes:  Recent Labs Lab 11/22/15 2110 11/23/15 0052 11/23/15 0602  TROPONINI 0.06* 0.07* 0.08*   Radiology: Dg Chest Portable 1 View  Result Date: 11/22/2015 CLINICAL DATA:  Chest pain and weakness EXAM: PORTABLE CHEST 1 VIEW COMPARISON:  11/10/2015 FINDINGS: Chronic cardiopericardial enlargement. There is diffuse interstitial opacity consistent with edema. Superimposed more coarse interstitial opacities is likely scarring. No effusion or pneumothorax. Vascular pedicle widening. IMPRESSION: CHF. Electronically Signed   By: Marnee Spring M.D.   On: 11/22/2015 21:07     ECG: Sinus rhythm, T-wave inversion inferior and anterior  laterally.    Impression and Recommendations 1. Chest pain: Worrisome for angina. On last hospitalization discussion for catheterization was had with the patient by Dr. Diona Browner. Currently her creatinine, INR, and hemoglobin to not allow for catheterization at this time. This will need to be normalized prior to transfer. I have talked with the patient about potential catheterization, risks and benefits, and transfer. She is willing to proceed with catheterization.I explained to her that we will need to get her labs at a safe level prior to considering catheterization. Coumadin has been placed on hold, we will continue to monitor creatinine sodium and INR, along with H&H. Start heparin per pharmacy.  Continue nitroglycerin patch, diltiazem for assistance and blood pressure control and angina. We'll discuss further with Dr. Purvis Sheffield with planned catheterization sometime this week once she is stable on lab values.She verbalizes understanding.   2. Diastolic CHF: She has been given IV Lasix with current total output of 750 cc. She does have some mild nonpitting lower extremity edema while feet are elevated in the bed. I am seeing elevated JVP, likely from pulmonary hypertension. Sodium is improved with IV diuresis. We'll continue by mouth torsemide 20 mg twice a day. She may benefit from both right and left heart catheterization.   3. Hypertension: With elevated creatinine, may need to decrease lisinopril and increase diltiazem or add hydralazine dosing. I favor increasing hydralazine as her heart rate is well controlled to bradycardic. We'll increase to 100 mg 3 times a day and decrease lisinopril to 40 mg daily only.  4. Anemia: We'll Hemoccult stools. Monitor H&H and for active bleeding.   Signed: Bettey Mare. Lawrence NP [facility]  11/23/2015, 8:18 AM Co-Sign MD  The patient was seen and examined, and I agree with the history, physical exam, assessment and plan as documented above, with modifications  as noted below.  82 yr old woman with permanent atrial fibrillation and chronic diastolic heart failure admitted with chest pain with minimal troponin elevation, and recent hospitalization for same symptoms. Dr. Diona Browner discussed coronary angiography should symptoms progress in spite of adequate medical therapy. Creatinine is elevated (1.83) and she is anemic (Hgb 9.5, previously 12.7 nearly 2 weeks ago). INR 2.34.  I agree that she will need some optimization prior to undergoing right and left heart catheterization/coronary angiography.  She is certainly at risk for contrast-induced nephropathy.  Warfarin on hold, IV heparin cautiously being initiated with close monitoring of Hgb.   Continue ASA, Lipitor, diltiazem, and nitrates. Continue torsemide for diastolic heart failure.  Will transfer to [facility] under care of hospitalist service to address aforementioned medical issues. We will continue to consult and determine optimal timing of cardiac catheterization.  Prentice Docker, MD, [facility]  11/23/2015 9:22 AM

## 2015-11-23 NOTE — Progress Notes (Signed)
Patient c/o chest pain rate 10 on pain scale, NTG given x3 without relief, Dr. Purvis SheffieldKoneswaran notified, orders to start NTG drip and transfer to ICU.

## 2015-11-23 NOTE — Progress Notes (Signed)
CRITICAL VALUE ALERT  Critical value received:  Troponin 0.08  Date of notification:  11/23/2015  Time of notification:  0757  Critical value read back:Yes.    Nurse who received alert:  Billie LadeValdese Keri Veale  MD notified (1st page):  Dr [name]  Time of first page:  0757  MD notified (2nd page):  Time of second page:  Responding MD:  Dr [name]  Time MD responded:  [phone]

## 2015-11-24 DIAGNOSIS — N184 Chronic kidney disease, stage 4 (severe): Secondary | ICD-10-CM | POA: Diagnosis present

## 2015-11-24 DIAGNOSIS — I5032 Chronic diastolic (congestive) heart failure: Secondary | ICD-10-CM

## 2015-11-24 LAB — HEPARIN LEVEL (UNFRACTIONATED): HEPARIN UNFRACTIONATED: 0.42 [IU]/mL (ref 0.30–0.70)

## 2015-11-24 LAB — GLUCOSE, CAPILLARY
GLUCOSE-CAPILLARY: 147 mg/dL — AB (ref 65–99)
GLUCOSE-CAPILLARY: 179 mg/dL — AB (ref 65–99)
Glucose-Capillary: 98 mg/dL (ref 65–99)
Glucose-Capillary: 100 mg/dL — ABNORMAL HIGH (ref 65–99)
Glucose-Capillary: 144 mg/dL — ABNORMAL HIGH (ref 65–99)
Glucose-Capillary: 127 mg/dL — ABNORMAL HIGH (ref 65–99)

## 2015-11-24 LAB — CBC
HCT: 29.3 % — ABNORMAL LOW (ref 36.0–46.0)
Hemoglobin: 9.9 g/dL — ABNORMAL LOW (ref 12.0–15.0)
MCH: 31.8 pg (ref 26.0–34.0)
MCHC: 33.8 g/dL (ref 30.0–36.0)
MCV: 94.2 fL (ref 78.0–100.0)
Platelets: 264 10*3/uL (ref 150–400)
RBC: 3.11 MIL/uL — ABNORMAL LOW (ref 3.87–5.11)
RDW: 13.7 % (ref 11.5–15.5)
WBC: 6.3 10*3/uL (ref 4.0–10.5)

## 2015-11-24 LAB — MRSA PCR SCREENING: MRSA BY PCR: NEGATIVE

## 2015-11-24 LAB — PROTIME-INR
INR: 2.28
Prothrombin Time: 25.6 seconds — ABNORMAL HIGH (ref 11.4–15.2)

## 2015-11-24 NOTE — Plan of Care (Signed)
Problem: Phase I Progression Outcomes Goal: Hemodynamically stable Outcome: Progressing Patient came from [facility] on an NTG drip and a Heparin drip in stable condition with no chest pain and VS in A-Fib high 40's to mid 60's with B/P 160's/90's, will monitor and titrate meds according to orders and patient's condition.

## 2015-11-24 NOTE — Progress Notes (Signed)
     SUBJECTIVE: Mild chest pain overnight. No chest pain this am. NO dyspnea.   Tele: atrial fib, rate 59 bpm  BP 129/79 (BP Location: Right Arm)   Pulse 60   Temp 98.5 F (36.9 C) (Axillary)   Resp 12   Ht 5\' 6"  (1.676 m)   Wt 153 lb 3.2 oz (69.5 kg)   SpO2 100%   BMI 24.73 kg/m   Intake/Output Summary (Last 24 hours) at 11/24/15 1003 Last data filed at 11/24/15 0744  Gross per 24 hour  Intake            708.3 ml  Output             1301 ml  Net           -592.7 ml    PHYSICAL EXAM General: Well developed, well nourished, in no acute distress. Alert and oriented x 3.  Psych:  Good affect, responds appropriately Neck: No JVD. No masses noted.  Lungs: Clear bilaterally with no wheezes or rhonci noted.  Heart: irreg irreg with no murmurs noted. Abdomen: Bowel sounds are present. Soft, non-tender.  Extremities: No lower extremity edema.   LABS: Basic Metabolic Panel:  Recent Labs  16/11/9608/15/17 2110 11/23/15 0602  NA 127* 129*  K 5.4* 4.7  CL 97* 97*  CO2 20* 26  GLUCOSE 108* 67  BUN 36* 37*  CREATININE 1.98* 1.83*  CALCIUM 9.3 8.7*   CBC:  Recent Labs  11/23/15 1938 11/24/15 0321  WBC 6.6 6.3  HGB 10.2* 9.9*  HCT 30.2* 29.3*  MCV 95.3 94.2  PLT 251 264   Cardiac Enzymes:  Recent Labs  11/23/15 0052 11/23/15 0602 11/23/15 1250  TROPONINI 0.07* 0.08* 0.07*    Current Meds: . aspirin EC  81 mg Oral Daily  . atorvastatin  40 mg Oral Daily  . darifenacin  7.5 mg Oral Daily  . diltiazem  120 mg Oral Daily  . hydrALAZINE  100 mg Oral TID  . insulin aspart  0-9 Units Subcutaneous Q4H  . isosorbide mononitrate  60 mg Oral Daily  . latanoprost  1 drop Both Eyes QHS  . lisinopril  40 mg Oral Daily  . nitroGLYCERIN  0.4 mg Transdermal Daily  . pantoprazole  40 mg Oral Q0600  . sodium chloride flush  3 mL Intravenous Q12H  . torsemide  20 mg Oral BID     ASSESSMENT AND PLAN:  1. Unstable angina: Pt with no known CAD but recent NSTEMI November 10, 2015. No invasive evaluation at that time. Now readmitted to [facility] with chest pain, mild troponin elevation. [facility] was [facility]. She has been on chronic coumadin. INR is  2.28 today. She is on IV heparin. Will continue ASA, statin, Imdur, Cardizem. Will plan cardiac cath when INR is less than 1.8.  -Repeat BMET, CBC and INR in am -Continue IV heparin  2. Chronic diastolic CHF: She is on home dose of Torsemide. Volume status is ok.   3. Anemia: H/H is stable. No active bleeding.   4. HTN: BP controlled. Lisinopril lowered to 40 mg daily 11/23/15 and Hydralazine increased.   Christopher McAlhany  10/17/201710:03 AM

## 2015-11-24 NOTE — Plan of Care (Signed)
Problem: Education: Goal: Knowledge of disease or condition will improve Outcome: Progressing Reviewed with family about A-Fib and the meds being used to help keep patient safe, all questions answered at this time, will continue to monitor and adjust POC accordingly.

## 2015-11-24 NOTE — Progress Notes (Signed)
Phone report received from Firefighterlga RN at [facility], reviewed reason for transfer meds, POC, Vs and patient's general condition, awaiting arrival to 3W-15

## 2015-11-24 NOTE — Discharge Summary (Signed)
NAMZenovia Jordan:  Penn, Emmagene             ACCOUNT NO.:  [id number]  MEDICAL RECORD NO.:  [id number]  LOCATION:  A331                          FACILITY:  [facility]  PHYSICIAN:  Sharlyne Koeneman L. [name], M.D.DATE OF BIRTH:  02/29/1930  DATE OF ADMISSION:  11/10/2015 DATE OF DISCHARGE:  10/05/2017LH                              DISCHARGE SUMMARY   DISCHARGE DIAGNOSES:  Chronic kidney disease, stage III.     Rohin Krejci L. [name], M.D.     ELH/MEDQ  D:  11/23/2015  T:  11/24/2015  Job:  [id number]

## 2015-11-24 NOTE — Progress Notes (Signed)
ANTICOAGULATION CONSULT NOTE  Pharmacy Consult for Heparin Indication: chest pain/ACS and atrial fibrillation  No Known Allergies  Patient Measurements: Height: 5\' 6"  (167.6 cm) Weight: 153 lb 3.2 oz (69.5 kg) IBW/kg (Calculated) : 59.3 HEPARIN DW (KG): 69.5   Vital Signs: Temp: 98.5 F (36.9 C) (10/17 0448) Temp Source: Oral (10/17 0448) BP: 129/79 (10/17 0448) Pulse Rate: 60 (10/17 0448)  Labs:  Recent Labs  11/22/15 2110 11/23/15 0052 11/23/15 0602 11/23/15 1250 11/23/15 1938 11/24/15 0321  HGB 10.5*  --  9.5*  --  10.2* 9.9*  HCT 31.0*  --  28.3*  --  30.2* 29.3*  PLT 269  --  249  --  251 264  APTT 45*  --   --   --   --   --   LABPROT 24.7*  --  26.1*  --   --   --   INR 2.19  --  2.34  --   --   --   HEPARINUNFRC  --   --   --   --  0.38 0.42  CREATININE 1.98*  --  1.83*  --   --   --   TROPONINI 0.06* 0.07* 0.08* 0.07*  --   --    Estimated Creatinine Clearance: 22.2 mL/min (by C-G formula based on SCr of 1.83 mg/dL (H)).  Assessment: 82 y.o. female with chest pain and h/o Afib, Coumadin on hold, for heparin  Goal of Therapy:  Heparin level 0.3-0.7 units/ml Monitor platelets by anticoagulation protocol: Yes   Plan:  Continue Heparin at current rate   Ancel Easler, Gary FleetGregory Vernon 11/24/2015,5:09 AM

## 2015-11-24 NOTE — Progress Notes (Signed)
[facility]  Patient Status: Active (receiving services up to time of hospitalization)  [facility] is providing the following services: RN  If patient discharges after hours, please call [phone].   Melissa [name] 11/24/2015, 11:34 AM

## 2015-11-25 LAB — BASIC METABOLIC PANEL
ANION GAP: 9 (ref 5–15)
BUN: 35 mg/dL — ABNORMAL HIGH (ref 6–20)
CALCIUM: 8.6 mg/dL — AB (ref 8.9–10.3)
CO2: 24 mmol/L (ref 22–32)
Chloride: 101 mmol/L (ref 101–111)
Creatinine, Ser: 1.93 mg/dL — ABNORMAL HIGH (ref 0.44–1.00)
GFR, EST AFRICAN AMERICAN: 27 mL/min — AB (ref >60)
GFR, EST NON AFRICAN AMERICAN: 23 mL/min — AB (ref >60)
Glucose, Bld: 96 mg/dL (ref 65–99)
POTASSIUM: 4.7 mmol/L (ref 3.5–5.1)
Sodium: 134 mmol/L — ABNORMAL LOW (ref 135–145)

## 2015-11-25 LAB — GLUCOSE, CAPILLARY
GLUCOSE-CAPILLARY: 103 mg/dL — AB (ref 65–99)
GLUCOSE-CAPILLARY: 120 mg/dL — AB (ref 65–99)
GLUCOSE-CAPILLARY: 198 mg/dL — AB (ref 65–99)
Glucose-Capillary: 132 mg/dL — ABNORMAL HIGH (ref 65–99)
Glucose-Capillary: 137 mg/dL — ABNORMAL HIGH (ref 65–99)
Glucose-Capillary: 89 mg/dL (ref 65–99)
Glucose-Capillary: 92 mg/dL (ref 65–99)

## 2015-11-25 LAB — CBC
HEMATOCRIT: 29.8 % — AB (ref 36.0–46.0)
Hemoglobin: 10 g/dL — ABNORMAL LOW (ref 12.0–15.0)
MCH: 31.6 pg (ref 26.0–34.0)
MCHC: 33.6 g/dL (ref 30.0–36.0)
MCV: 94.3 fL (ref 78.0–100.0)
Platelets: 264 10*3/uL (ref 150–400)
RBC: 3.16 MIL/uL — AB (ref 3.87–5.11)
RDW: 13.9 % (ref 11.5–15.5)
WBC: 7.4 10*3/uL (ref 4.0–10.5)

## 2015-11-25 LAB — PROTIME-INR
INR: 1.77
Prothrombin Time: 20.9 seconds — ABNORMAL HIGH (ref 11.4–15.2)

## 2015-11-25 LAB — HEPARIN LEVEL (UNFRACTIONATED): HEPARIN UNFRACTIONATED: 0.53 [IU]/mL (ref 0.30–0.70)

## 2015-11-25 MED ORDER — SODIUM CHLORIDE 0.9% FLUSH
3.0000 mL | INTRAVENOUS | Status: DC | PRN
Start: 2015-11-25 — End: 2015-11-26

## 2015-11-25 MED ORDER — SODIUM CHLORIDE 0.9% FLUSH: 3.0000 mL | Freq: Two times a day (BID) | INTRAVENOUS | Status: DC

## 2015-11-25 MED ORDER — SODIUM CHLORIDE 0.9 % WEIGHT BASED INFUSION
1.0000 mL/kg/h | INTRAVENOUS | Status: DC
  Administered 2015-11-25: 1 mL/kg/h via INTRAVENOUS

## 2015-11-25 MED ORDER — SODIUM CHLORIDE 0.9 % IV SOLN
250.0000 mL | INTRAVENOUS | Status: DC | PRN
Start: 2015-11-25 — End: 2015-11-26

## 2015-11-25 MED ORDER — ASPIRIN 81 MG PO CHEW
81.0000 mg | CHEWABLE_TABLET | ORAL | Status: AC
  Administered 2015-11-26: 81 mg via ORAL
  Filled 2015-11-25: qty 1

## 2015-11-25 NOTE — Progress Notes (Signed)
Patient Name: Melissa [name] Date of Encounter: 11/25/2015  Primary Cardiologist: Dr. Leroy LibmanMcDowell   Hospital Problem List     Active Problems:   Unstable angina [facility](HCC)   Chronic kidney disease (CKD), stage IV (severe) (HCC)     Subjective   Feels well, denies chest pain and SOB.   Inpatient Medications    Scheduled Meds: . aspirin EC  81 mg Oral Daily  . atorvastatin  40 mg Oral Daily  . darifenacin  7.5 mg Oral Daily  . diltiazem  120 mg Oral Daily  . hydrALAZINE  100 mg Oral TID  . insulin aspart  0-9 Units Subcutaneous Q4H  . isosorbide mononitrate  60 mg Oral Daily  . latanoprost  1 drop Both Eyes QHS  . lisinopril  40 mg Oral Daily  . pantoprazole  40 mg Oral Q0600  . sodium chloride flush  3 mL Intravenous Q12H  . torsemide  20 mg Oral BID   Continuous Infusions: . heparin 900 Units/hr (11/23/15 2000)  . nitroGLYCERIN Stopped (11/24/15 0817)   PRN Meds: acetaminophen, ALPRAZolam, ondansetron **OR** ondansetron (ZOFRAN) IV   Vital Signs    Vitals:   11/24/15 2021 11/24/15 2359 11/25/15 0440 11/25/15 0722  BP: (!) 142/64 112/73 (!) 146/87 136/72  Pulse: (!) 53 (!) 51 62 (!) 51  Resp: 11 13 11 15   Temp: 98.4 F (36.9 C) 98.2 F (36.8 C) 98.5 F (36.9 C) 98.7 F (37.1 C)  TempSrc: Oral Oral Oral Oral  SpO2: 100% 100% 100% 100%  Weight:   153 lb 3.2 oz (69.5 kg)   Height:        Intake/Output Summary (Last 24 hours) at 11/25/15 0914 Last data filed at 11/25/15 [id number]  Gross per 24 hour  Intake              358 ml  Output             2350 ml  Net            -1992 ml   Filed Weights   11/23/15 2222 11/24/15 0448 11/25/15 0440  Weight: 153 lb 4.8 oz (69.5 kg) 153 lb 3.2 oz (69.5 kg) 153 lb 3.2 oz (69.5 kg)    Physical Exam   GEN: Well nourished, well developed, in no acute distress.  HEENT: Grossly normal.  Neck: Supple, no JVD, carotid bruits, or masses. Cardiac: RRR, no murmurs, rubs, or gallops. No clubbing, cyanosis, edema.  Radials/DP/PT  2+ and equal bilaterally.  Respiratory:  Respirations regular and unlabored, clear to auscultation bilaterally. GI: Soft, nontender, nondistended, BS + x 4. MS: no deformity or atrophy. Skin: warm and dry, no rash. Neuro:  Strength and sensation are intact. Psych: AAOx3.  Normal affect.  Labs    CBC  Recent Labs  11/24/15 0321 11/25/15 0417  WBC 6.3 7.4  HGB 9.9* 10.0*  HCT 29.3* 29.8*  MCV 94.2 94.3  PLT 264 264   Basic Metabolic Panel  Recent Labs  11/23/15 0602 11/25/15 0417  NA 129* 134*  K 4.7 4.7  CL 97* 101  CO2 26 24  GLUCOSE 67 96  BUN 37* 35*  CREATININE 1.83* 1.93*  CALCIUM 8.7* 8.6*   Liver Function Tests  Recent Labs  11/22/15 2110  AST 33  ALT 17  ALKPHOS 163*  BILITOT 0.8  PROT 7.3  ALBUMIN 3.8   Cardiac Enzymes  Recent Labs  11/23/15 0052 11/23/15 0602 11/23/15 1250  TROPONINI 0.07* 0.08* 0.07*  Telemetry    NSR- Personally Reviewed  ECG    NSR, ST depression and T wave inversion in anterolateral leads- Personally Reviewed  Radiology    No results found.  Cardiac Studies   Transthoracic Echocardiography 11/11/15 Study Conclusions  - Left ventricle: The cavity size was normal. Wall thickness was   increased in a pattern of mild LVH. Systolic function was normal.   The estimated ejection fraction was in the range of 60% to 65%.   There is akinesis of the mid-apicalanteroseptal and apical   myocardium. The study is not technically sufficient to allow   evaluation of LV diastolic function. - Aortic valve: Mildly calcified annulus. Trileaflet; mildly   thickened leaflets. - Mitral valve: Calcified annulus. There was trivial regurgitation. - Left atrium: The atrium was severely dilated. - Right atrium: The atrium was severely dilated. Central venous   pressure (est): 8 mm Hg. - Tricuspid valve: There was moderate regurgitation. - Pulmonary arteries: Systolic pressure was severely increased. PA   peak pressure: 84  mm Hg (S). - Pericardium, extracardiac: A trivial pericardial effusion was   identified posterior to the heart.  Impressions:  - Mild LVH with LVEF 60-65%. There is akinesis of the mid to apical   anteroseptal wall and apex with associated prominent   trabeculation, no obvious LV mural thrombus. Indeterminate   diastolic function. Severe biatrial enlargement. MAC with trivial   mitral regurgitation. Mildly sclerotic aortic valve. Moderate   tricuspid regurgitation with evidence of severe pulmonary   hypertension, PASP estimated 84 mmHg. Trivial posterior   pericardial effusion.  Patient Profile     Melissa [name] is a 82 year old female with a past medical history of HTN, chronic LE edema, pulmonary hypertension, chronic afib on Coumadin. Presented to [facility] with chest pain, transferred to [facility] for heart cath.  Assessment & Plan   1. Unstable angina: Pt with no known CAD but recent NSTEMI November 10, 2015. No invasive evaluation at that time. Now readmitted to [facility] with chest pain, mild troponin elevation. She was transferred to [facility] for cardiac cath. She has been on chronic coumadin. INR is 1.77 today. She is on IV heparin. Will continue ASA, statin, Imdur, Cardizem.  MD to advise on cath today, Scr is increased this am.   2. Chronic diastolic CHF: She is on home dose of Torsemide. Volume status is ok.   3. Anemia: H/H is stable. No active bleeding.   4. HTN: BP well controlled. Lisinopril lowered to 40 mg daily 11/23/15 and Hydralazine increased.   Signed, Little Ishikawa, NP  11/25/2015, 9:14 AM   I have personally seen and examined this patient with Suzzette Righter, NP. I agree with the assessment and plan as outlined above. She is having no chest pain. Mild troponin elevation in setting of likely unstable angina. INR is 1.77 today. She will likely need groin access for planned right and left heart catheterization tomorrow so will delay cath until INR is under 1.7.  H/H stable. Renal function overall stable. (Baseline creatinine is 1.7). Gentle hydration tonight. NPO at midnight for right and left heart cath tomorrow.   Verne Carrow 11/25/2015 10:27 AM

## 2015-11-25 NOTE — Progress Notes (Signed)
ANTICOAGULATION CONSULT NOTE  Pharmacy Consult for Heparin Indication: chest pain/ACS and atrial fibrillation  No Known Allergies  Patient Measurements: Height: 5\' 6"  (167.6 cm) Weight: 153 lb 3.2 oz (69.5 kg) IBW/kg (Calculated) : 59.3 HEPARIN DW (KG): 69.5   Vital Signs: Temp: 98.7 F (37.1 C) (10/18 0722) Temp Source: Oral (10/18 0722) BP: 136/72 (10/18 0722) Pulse Rate: 51 (10/18 0722)  Labs:  Recent Labs  11/22/15 2110 11/23/15 0052 11/23/15 0602 11/23/15 1250 11/23/15 1938 11/24/15 0321 11/25/15 0417  HGB 10.5*  --  9.5*  --  10.2* 9.9* 10.0*  HCT 31.0*  --  28.3*  --  30.2* 29.3* 29.8*  PLT 269  --  249  --  251 264 264  APTT 45*  --   --   --   --   --   --   LABPROT 24.7*  --  26.1*  --   --  25.6* 20.9*  INR 2.19  --  2.34  --   --  2.28 1.77  HEPARINUNFRC  --   --   --   --  0.38 0.42 0.53  CREATININE 1.98*  --  1.83*  --   --   --  1.93*  TROPONINI 0.06* 0.07* 0.08* 0.07*  --   --   --    Estimated Creatinine Clearance: 21 mL/min (by C-G formula based on SCr of 1.93 mg/dL (H)).  Assessment: 82 y.o. female with chest pain and h/o Afib, Coumadin on hold, for heparin. Heparin level therapeutic x3, most recently 0.53. Hgb low, stable, PLTC stable, wnl. No s/sx bleeding noted. INR downtrended ot 1.77, planning for cath 10/19 per note.   Goal of Therapy:  Heparin level 0.3-0.7 units/ml Monitor platelets by anticoagulation protocol: Yes   Plan:  Continue Heparin at 900 units/hr CBC, HL daily while on heparin Monitor for s/sx bleeding   Allena Katzaroline E Briannia Laba, Pharm.D. PGY1 Pharmacy Resident 10/18/201710:27 AM Pager [phone]

## 2015-11-25 NOTE — Plan of Care (Signed)
Problem: Activity: Goal: Risk for activity intolerance will decrease Outcome: Progressing Patient to and from bedside commode, tolerated activity well.  No complaints of pain, no shortness of breath noted per RN.

## 2015-11-26 ENCOUNTER — Encounter (HOSPITAL_COMMUNITY)
Admission: EM | Disposition: A | Discharge status: Home / Self Care | Payer: Self-pay | Source: Home / Self Care | Attending: Cardiovascular Disease

## 2015-11-26 DIAGNOSIS — I214 Non-ST elevation (NSTEMI) myocardial infarction: Secondary | ICD-10-CM | POA: Diagnosis present

## 2015-11-26 DIAGNOSIS — I25110 Atherosclerotic heart disease of native coronary artery with unstable angina pectoris: Secondary | ICD-10-CM

## 2015-11-26 DIAGNOSIS — I272 Pulmonary hypertension, unspecified: Secondary | ICD-10-CM

## 2015-11-26 HISTORY — PX: CARDIAC CATHETERIZATION: SHX172

## 2015-11-26 LAB — CBC
HEMATOCRIT: 29 % — AB (ref 36.0–46.0)
Hemoglobin: 9.7 g/dL — ABNORMAL LOW (ref 12.0–15.0)
MCH: 31.4 pg (ref 26.0–34.0)
MCHC: 33.4 g/dL (ref 30.0–36.0)
MCV: 93.9 fL (ref 78.0–100.0)
PLATELETS: 259 10*3/uL (ref 150–400)
RBC: 3.09 MIL/uL — ABNORMAL LOW (ref 3.87–5.11)
RDW: 13.8 % (ref 11.5–15.5)
WBC: 7.8 10*3/uL (ref 4.0–10.5)

## 2015-11-26 LAB — POCT I-STAT 3, ART BLOOD GAS (G3+)
BICARBONATE: 25.6 mmol/L (ref 20.0–28.0)
O2 Saturation: 99 %
PO2 ART: 119 mmHg — AB (ref 83.0–108.0)
TCO2: 27 mmol/L (ref 0–100)
pCO2 arterial: 43.1 mmHg (ref 32.0–48.0)
pH, Arterial: 7.383 (ref 7.350–7.450)

## 2015-11-26 LAB — POCT I-STAT 3, VENOUS BLOOD GAS (G3P V)
Bicarbonate: 25.1 mmol/L (ref 20.0–28.0)
O2 Saturation: 67 %
PCO2 VEN: 41.4 mmHg — AB (ref 44.0–60.0)
PH VEN: 7.392 (ref 7.250–7.430)
PO2 VEN: 35 mmHg (ref 32.0–45.0)
TCO2: 26 mmol/L (ref 0–100)

## 2015-11-26 LAB — GLUCOSE, CAPILLARY
Glucose-Capillary: 137 mg/dL — ABNORMAL HIGH (ref 65–99)
Glucose-Capillary: 117 mg/dL — ABNORMAL HIGH (ref 65–99)
Glucose-Capillary: 137 mg/dL — ABNORMAL HIGH (ref 65–99)
Glucose-Capillary: 228 mg/dL — ABNORMAL HIGH (ref 65–99)
Glucose-Capillary: 142 mg/dL — ABNORMAL HIGH (ref 65–99)

## 2015-11-26 LAB — HEPARIN LEVEL (UNFRACTIONATED): HEPARIN UNFRACTIONATED: 0.47 [IU]/mL (ref 0.30–0.70)

## 2015-11-26 LAB — BASIC METABOLIC PANEL
ANION GAP: 10 (ref 5–15)
BUN: 30 mg/dL — ABNORMAL HIGH (ref 6–20)
CALCIUM: 8.9 mg/dL (ref 8.9–10.3)
CO2: 24 mmol/L (ref 22–32)
CREATININE: 1.78 mg/dL — AB (ref 0.44–1.00)
Chloride: 100 mmol/L — ABNORMAL LOW (ref 101–111)
GFR calc Af Amer: 29 mL/min — ABNORMAL LOW (ref >60)
GFR, EST NON AFRICAN AMERICAN: 25 mL/min — AB (ref >60)
GLUCOSE: 109 mg/dL — AB (ref 65–99)
Potassium: 3.9 mmol/L (ref 3.5–5.1)
Sodium: 134 mmol/L — ABNORMAL LOW (ref 135–145)

## 2015-11-26 LAB — POCT ACTIVATED CLOTTING TIME: Activated Clotting Time: 147 seconds

## 2015-11-26 LAB — PROTIME-INR
INR: 1.49
PROTHROMBIN TIME: 18.2 s — AB (ref 11.4–15.2)

## 2015-11-26 SURGERY — RIGHT/LEFT HEART CATH AND CORONARY ANGIOGRAPHY

## 2015-11-26 MED ORDER — SODIUM CHLORIDE 0.9% FLUSH
3.0000 mL | Freq: Two times a day (BID) | INTRAVENOUS | Status: DC
  Administered 2015-11-28 – 2015-12-01 (×5): 3 mL via INTRAVENOUS

## 2015-11-26 MED ORDER — MIDAZOLAM HCL 2 MG/2ML IJ SOLN
INTRAMUSCULAR | Status: AC
  Filled 2015-11-26: qty 2

## 2015-11-26 MED ORDER — MIDAZOLAM HCL 2 MG/2ML IJ SOLN
INTRAMUSCULAR | Status: DC | PRN
  Administered 2015-11-26: 1 mg via INTRAVENOUS

## 2015-11-26 MED ORDER — HEPARIN (PORCINE) IN NACL 2-0.9 UNIT/ML-% IJ SOLN
INTRAMUSCULAR | Status: DC | PRN
  Administered 2015-11-26: 1000 mL

## 2015-11-26 MED ORDER — HEPARIN (PORCINE) IN NACL 100-0.45 UNIT/ML-% IJ SOLN
1050.0000 [IU]/h | INTRAMUSCULAR | Status: DC
  Administered 2015-11-26: 900 [IU]/h via INTRAVENOUS
  Administered 2015-11-27: 1000 [IU]/h via INTRAVENOUS
  Administered 2015-11-28: 1050 [IU]/h via INTRAVENOUS
  Administered 2015-11-28: 1000 [IU]/h via INTRAVENOUS
  Administered 2015-11-30 – 2015-12-01 (×2): 1050 [IU]/h via INTRAVENOUS
  Filled 2015-11-26 (×5): qty 250

## 2015-11-26 MED ORDER — NITROGLYCERIN 0.4 MG SL SUBL
SUBLINGUAL_TABLET | SUBLINGUAL | Status: AC
  Administered 2015-11-26: 0.4 mg via SUBLINGUAL
  Filled 2015-11-26: qty 1

## 2015-11-26 MED ORDER — SODIUM CHLORIDE 0.9% FLUSH: 3.0000 mL | INTRAVENOUS | Status: DC | PRN

## 2015-11-26 MED ORDER — NITROGLYCERIN IN D5W 200-5 MCG/ML-% IV SOLN
0.0000 ug/min | INTRAVENOUS | Status: DC
  Administered 2015-11-26 – 2015-11-28 (×2): 10 ug/min via INTRAVENOUS
  Filled 2015-11-26: qty 250

## 2015-11-26 MED ORDER — VERAPAMIL HCL 2.5 MG/ML IV SOLN
INTRAVENOUS | Status: AC
  Filled 2015-11-26: qty 2

## 2015-11-26 MED ORDER — NITROGLYCERIN IN D5W 200-5 MCG/ML-% IV SOLN
INTRAVENOUS | Status: AC
  Filled 2015-11-26: qty 250

## 2015-11-26 MED ORDER — SODIUM CHLORIDE 0.9 % IV SOLN
INTRAVENOUS | Status: AC
  Administered 2015-11-26: 18:00:00 via INTRAVENOUS
  Administered 2015-11-26: 75 mL/h via INTRAVENOUS

## 2015-11-26 MED ORDER — SODIUM CHLORIDE 0.9 % IV SOLN: 250.0000 mL | INTRAVENOUS | Status: DC | PRN

## 2015-11-26 MED ORDER — NITROGLYCERIN IN D5W 200-5 MCG/ML-% IV SOLN
INTRAVENOUS | Status: DC | PRN
  Administered 2015-11-26: 10 ug/min via INTRAVENOUS

## 2015-11-26 MED ORDER — NITROGLYCERIN 0.4 MG SL SUBL
0.4000 mg | SUBLINGUAL_TABLET | SUBLINGUAL | Status: DC | PRN
Start: 2015-11-26 — End: 2015-12-05
  Administered 2015-11-26 (×2): 0.4 mg via SUBLINGUAL

## 2015-11-26 MED ORDER — LIDOCAINE HCL (PF) 1 % IJ SOLN
INTRAMUSCULAR | Status: DC | PRN
  Administered 2015-11-26: 15 mL

## 2015-11-26 MED ORDER — IOPAMIDOL (ISOVUE-370) INJECTION 76%
INTRAVENOUS | Status: AC
  Filled 2015-11-26: qty 100

## 2015-11-26 MED ORDER — FENTANYL CITRATE (PF) 100 MCG/2ML IJ SOLN
INTRAMUSCULAR | Status: DC | PRN
  Administered 2015-11-26: 25 ug via INTRAVENOUS

## 2015-11-26 MED ORDER — FENTANYL CITRATE (PF) 100 MCG/2ML IJ SOLN
INTRAMUSCULAR | Status: AC
  Filled 2015-11-26: qty 2

## 2015-11-26 MED ORDER — MORPHINE SULFATE (PF) 2 MG/ML IV SOLN
2.0000 mg | INTRAVENOUS | Status: DC | PRN
  Administered 2015-12-01: 2 mg via INTRAVENOUS
  Filled 2015-11-26: qty 1

## 2015-11-26 MED ORDER — HEPARIN (PORCINE) IN NACL 2-0.9 UNIT/ML-% IJ SOLN
INTRAMUSCULAR | Status: AC
  Filled 2015-11-26: qty 1000

## 2015-11-26 MED ORDER — LIDOCAINE HCL (PF) 1 % IJ SOLN
INTRAMUSCULAR | Status: AC
  Filled 2015-11-26: qty 30

## 2015-11-26 SURGICAL SUPPLY — 12 items
CATH INFINITI 5FR MULTPACK ANG (CATHETERS) ×2 IMPLANT
CATH SWAN GANZ 7F STRAIGHT (CATHETERS) ×2 IMPLANT
COVER PRB 48X5XTLSCP FOLD TPE (BAG) IMPLANT
COVER PROBE 5X48 (BAG) ×3
GUIDEWIRE .025 260CM (WIRE) ×2 IMPLANT
KIT HEART LEFT (KITS) ×3 IMPLANT
PACK CARDIAC CATHETERIZATION (CUSTOM PROCEDURE TRAY) ×3 IMPLANT
SET INTRODUCER MICROPUNCT 5F (INTRODUCER) ×2 IMPLANT
SHEATH PINNACLE 5F 10CM (SHEATH) ×2 IMPLANT
SHEATH PINNACLE 7F 10CM (SHEATH) ×2 IMPLANT
TRANSDUCER W/STOPCOCK (MISCELLANEOUS) ×4 IMPLANT
TUBING CIL FLEX 10 FLL-RA (TUBING) ×3 IMPLANT

## 2015-11-26 NOTE — Progress Notes (Addendum)
Site area: RFA / RFV Site Prior to Removal:  Level 0 Pressure Applied For: 15 minutes Manual:  yes  Patient Status During Pull:  stable Post Pull Site:  Level 0 Post Pull Instructions Given: yes  Post Pull Pulses Present: palpable Dressing Applied:  4x4 and tegaderm  Bedrest begins @ 1330 Comments: Pulled by Adalberto IllAaron Vianne Grieshop

## 2015-11-26 NOTE — Progress Notes (Signed)
ANTICOAGULATION CONSULT NOTE  Pharmacy Consult for Heparin Indication: chest pain/ACS and atrial fibrillation  No Known Allergies  Patient Measurements: Height: 5\' 6"  (167.6 cm) Weight: 153 lb 4.8 oz (69.5 kg) IBW/kg (Calculated) : 59.3 HEPARIN DW (KG): 69.5   Vital Signs: Temp: 98 F (36.7 C) (10/19 1655) Temp Source: Oral (10/19 1655) BP: 117/47 (10/19 1625) Pulse Rate: 51 (10/19 1625)  Labs:  Recent Labs  11/24/15 0321 11/25/15 0417 11/26/15 0343  HGB 9.9* 10.0* 9.7*  HCT 29.3* 29.8* 29.0*  PLT 264 264 259  LABPROT 25.6* 20.9* 18.2*  INR 2.28 1.77 1.49  HEPARINUNFRC 0.42 0.53 0.47  CREATININE  --  1.93* 1.78*   Estimated Creatinine Clearance: 22.8 mL/min (by C-G formula based on SCr of 1.78 mg/dL (H)).  Assessment: 82 y.o. female with chest pain and h/o Afib, pt had cardiac cath today. She is on warfarin PTA for AFib. Current INR = 1.49. Sheath was removed at 1230 today, will resume heparin gtt at 2030.   Goal of Therapy:  Heparin level 0.3-0.7 units/ml Monitor platelets by anticoagulation protocol: Yes   Plan:  Resume heparin at 900 units/hr tonight at 2030 CBC, HL daily while on heparin Monitor for s/sx bleeding     Agapito GamesAlison Laetitia Schnepf, PharmD, BCPS Clinical Pharmacist 11/26/2015 6:07 PM

## 2015-11-26 NOTE — Interval H&P Note (Signed)
History and Physical Interval Note:  11/26/2015 11:21 AM  Melissa [name]  has presented today for cardiac catheterization, with the diagnosis of unstable angina  The various methods of treatment have been discussed with the patient and family. After consideration of risks, benefits and other options for treatment, the patient has consented to  Procedure(s): Right/Left Heart Cath and Coronary Angiography (N/A) as a surgical intervention .  The patient's history has been reviewed, patient examined, no change in status, stable for surgery.  I have reviewed the patient's chart and labs.  Questions were answered to the patient's satisfaction.    Cath Lab Visit (complete for each Cath Lab visit)  Clinical Evaluation Leading to the Procedure:   ACS: Yes.    Non-ACS:    Anginal Classification: CCS IV  Anti-ischemic medical therapy: Maximal Therapy (2 or more classes of medications)  Non-Invasive Test Results: No non-invasive testing performed (echo with anterior/anteroseptal WMA but preserved LVEF)  Prior CABG: No previous CABG   Nilay Mangrum

## 2015-11-26 NOTE — H&P (View-Only) (Signed)
Patient Name: Melissa [name] Date of Encounter: 11/26/2015  Primary Cardiologist: Dr. Leroy Libman Problem List     Active Problems:   Unstable angina [facility])   Chronic kidney disease (CKD), stage IV (severe) (HCC)     Subjective   Feels well this am, denies chest pain and SOB.    Inpatient Medications    Scheduled Meds: . aspirin EC  81 mg Oral Daily  . atorvastatin  40 mg Oral Daily  . darifenacin  7.5 mg Oral Daily  . diltiazem  120 mg Oral Daily  . hydrALAZINE  100 mg Oral TID  . insulin aspart  0-9 Units Subcutaneous Q4H  . isosorbide mononitrate  60 mg Oral Daily  . latanoprost  1 drop Both Eyes QHS  . lisinopril  40 mg Oral Daily  . pantoprazole  40 mg Oral Q0600  . sodium chloride flush  3 mL Intravenous Q12H  . sodium chloride flush  3 mL Intravenous Q12H  . torsemide  20 mg Oral BID   Continuous Infusions: . sodium chloride 1 mL/kg/hr (11/25/15 2130)  . heparin 900 Units/hr (11/25/15 1708)  . nitroGLYCERIN Stopped (11/24/15 0817)   PRN Meds: sodium chloride, acetaminophen, ALPRAZolam, ondansetron **OR** ondansetron (ZOFRAN) IV, sodium chloride flush   Vital Signs    Vitals:   11/25/15 2340 11/26/15 0537 11/26/15 0739 11/26/15 0750  BP: 131/69 (!) 153/69 135/75 138/66  Pulse: (!) 57 72 (!) 55 (!) 55  Resp: 14 18 15    Temp: 98.2 F (36.8 C) 98.2 F (36.8 C) 98.7 F (37.1 C) 98.6 F (37 C)  TempSrc: Oral Oral Oral Oral  SpO2: 100% 100% 100%   Weight:  153 lb 4.8 oz (69.5 kg)    Height:        Intake/Output Summary (Last 24 hours) at 11/26/15 0759 Last data filed at 11/26/15 0700  Gross per 24 hour  Intake          1311.35 ml  Output             2400 ml  Net         -1088.65 ml   Filed Weights   11/24/15 0448 11/25/15 0440 11/26/15 0537  Weight: 153 lb 3.2 oz (69.5 kg) 153 lb 3.2 oz (69.5 kg) 153 lb 4.8 oz (69.5 kg)    Physical Exam   GEN: Well nourished, well developed, in no acute distress.  HEENT: Grossly normal.  Neck:  Supple, no JVD, carotid bruits, or masses. Cardiac: RRR, no murmurs, rubs, or gallops. No clubbing, cyanosis, edema.  Radials/DP/PT 2+ and equal bilaterally.  Respiratory:  Respirations regular and unlabored, clear to auscultation bilaterally. GI: Soft, nontender, nondistended, BS + x 4. MS: no deformity or atrophy. Skin: warm and dry, no rash. Neuro:  Strength and sensation are intact. Psych: AAOx3.  Normal affect.  Labs    CBC  Recent Labs  11/25/15 0417 11/26/15 0343  WBC 7.4 7.8  HGB 10.0* 9.7*  HCT 29.8* 29.0*  MCV 94.3 93.9  PLT 264 259   Basic Metabolic Panel  Recent Labs  11/25/15 0417 11/26/15 0343  NA 134* 134*  K 4.7 3.9  CL 101 100*  CO2 24 24  GLUCOSE 96 109*  BUN 35* 30*  CREATININE 1.93* 1.78*  CALCIUM 8.6* 8.9   Cardiac Enzymes  Recent Labs  11/23/15 1250  TROPONINI 0.07*     Telemetry    NSR - Personally Reviewed  ECG     NSR,  ST depression and T wave inversion in anterolateral leads. - Personally Reviewed  Radiology    No results found.  Cardiac Studies  Transthoracic Echocardiography 11/11/15  Study Conclusions  - Left ventricle: The cavity size was normal. Wall thickness was   increased in a pattern of mild LVH. Systolic function was normal.   The estimated ejection fraction was in the range of 60% to 65%.   There is akinesis of the mid-apicalanteroseptal and apical   myocardium. The study is not technically sufficient to allow   evaluation of LV diastolic function. - Aortic valve: Mildly calcified annulus. Trileaflet; mildly   thickened leaflets. - Mitral valve: Calcified annulus. There was trivial regurgitation. - Left atrium: The atrium was severely dilated. - Right atrium: The atrium was severely dilated. Central venous   pressure (est): 8 mm Hg. - Tricuspid valve: There was moderate regurgitation. - Pulmonary arteries: Systolic pressure was severely increased. PA   peak pressure: 84 mm Hg (S). - Pericardium,  extracardiac: A trivial pericardial effusion was   identified posterior to the heart.  Impressions:  - Mild LVH with LVEF 60-65%. There is akinesis of the mid to apical   anteroseptal wall and apex with associated prominent   trabeculation, no obvious LV mural thrombus. Indeterminate   diastolic function. Severe biatrial enlargement. MAC with trivial   mitral regurgitation. Mildly sclerotic aortic valve. Moderate   tricuspid regurgitation with evidence of severe pulmonary   hypertension, PASP estimated 84 mmHg. Trivial posterior   pericardial effusion.  Patient Profile  Melissa [name] is a 82 y.o. female with medical history significant of DM type 2, CKD with Cr around 2, hyperlipidemia, HTN, A-fib which she is on coumadin, and diastolic CHF presented to the ED with complaints of CP, has known CAD with with recent NSTEMI with no invasive evaluation at that time.   Assessment & Plan    1. Unstable angina: Pt with no known CAD but recent NSTEMI November 10, 2015. No invasive evaluation at that time. Now readmitted to [facility] with chest pain, mild troponin elevation. [facility] was [facility]. She has been on chronic coumadin. INR is 1.49 today, for cath today.   She is on IV heparin. Will continue ASA, statin, Imdur, Cardizem.  2. Chronic diastolic CHF: She is on home dose of Torsemide. Volume status is ok.   3. Anemia: H/H is stable. No active bleeding.   4. HTN: BP controlled. Lisinopril lowered to 40 mg daily 11/23/15 and Hydralazine increased.    Signed, Little IshikawaErin E Smith, NP  11/26/2015, 7:59 AM   I have personally seen and examined this patient with Suzzette RighterErin Smith, NP. I agree with the assessment and plan as outlined above. She is admitted with unstable angina to [facility]. Transferred here for cath. INR is below 1.5. Recurrent chest pain this am. Will give SL NTG now. Cath lab is calling for pt now.   Verne CarrowChristopher  McAlhany 11/26/2015 10:53 AM

## 2015-11-26 NOTE — Progress Notes (Signed)
Pt complaint 8/10 sudden chest pain. Cp not relieved by two sublingual nitros. Cath lab here to pick up pt.  Dr. Sanjuana KavaMcalhaney on the floor and notified.  He stated to just continue to take pt to cath lab. VSS. Will transport with pt to cath lab.

## 2015-11-26 NOTE — Care Management Note (Signed)
Case Management Note  Patient Details  Name: Carley HammedBettie L Walts MRN: [id number] Date of Birth: 03-Sep-1930  Subjective/Objective: Pt presented for chest pain. Plan for cardiac cath 11-26-15. [facility]                    Action/Plan: CM will continue to monitor for additional needs. [facility] is  aware that pt is hospitalized.   Expected Discharge Date:                  Expected Discharge Plan:  Home w Home Health Services  In-House Referral:  NA  Discharge planning Services  CM Consult  Post Acute Care Choice:  Home Health, Resumption of Svcs/PTA Provider Choice offered to:  Patient  DME Arranged:    DME Agency:     HH Arranged:  RN HH Agency:  [facility]  Status of Service:  In process, will continue to follow  If discussed at Long Length of Stay Meetings, dates discussed:    Additional Comments:  Gala LewandowskyGraves-Bigelow, Govani Radloff Kaye, RN 11/26/2015, 3:53 PM

## 2015-11-26 NOTE — Consult Note (Signed)
[address]       [place] [address]             [phone]      Cardiothoracic Surgery Consultation  Reason for Consult: Severe multi-vessel coronary artery disease Referring Physician: Dr. Lauree Chandler  Melissa [name] is an 82 y.o. female.  HPI:   The patient has a history of DM, hyperlipidemia, hypertension and chronic atrial fibrillation on Coumadin and CKD with creat around 2 who was hospitalized in early October for NSTEMI. An echo at that time showed an EF of 60-65% with akinesis of the mid-apical anteroseptal and apical myocardium. She had a previous history of congestive heart failure and was in the hospital for that in 02/2015. She was admitted on 10/15 with recurrent chest pain and shortness of breath with exertion like walking around her house. It was relieved with rest. She had mildly elevated troponin on admission of .06  And subsequently .07 and .08. Her ECG showed sinus with inferior and anterolateral T-wave inversion that was new. CXR showed interstitial edema. Hgb was 10.5 and she was hyponatremic at 127. Cardiac cath today shows severe three vessel CAD with calcified coronaries and linear calcification of the ascending aortic wall proximally. Right and left heart filling pressures were normal. She has had no symptoms at rest prior to admission. She apparently did have some chest pain this am. She is not a great historian but her daughter and granddaughter are here to fill in the gaps.  Past Medical History:  Diagnosis Date  . Atrial fibrillation ([place])   . CHF (congestive heart failure) ([place])   . DDD (degenerative disc disease), lumbar   . Essential hypertension, benign   . Mixed hyperlipidemia   . Type 2 diabetes mellitus ([place])     Past Surgical History:  Procedure Laterality Date  . TOTAL HIP ARTHROPLASTY  01/14/03   Left    Family History  Problem Relation Age of Onset  . Diabetes type II Mother   . Diabetes type II Father     . Hypertension Father     Social History:  reports that she has never smoked. She has never used smokeless tobacco. She reports that she does not drink alcohol or use drugs.  Allergies: No Known Allergies  Medications:  I have reviewed the patient's current medications. Prior to Admission:  Prescriptions Prior to Admission  Medication Sig Dispense Refill Last Dose  . ALPRAZolam (XANAX) 0.25 MG tablet Take 0.25 mg by mouth 3 (three) times daily as needed.   11/21/2015 at Unknown time  . atorvastatin (LIPITOR) 20 MG tablet Take 20 mg by mouth daily.   11/22/2015 at Unknown time  . diltiazem (CARDIZEM CD) 120 MG 24 hr capsule Take 1 capsule (120 mg total) by mouth daily. 30 capsule 12 11/22/2015 at Unknown time  . glimepiride (AMARYL) 2 MG tablet Take 2 mg by mouth daily.     11/22/2015 at Unknown time  . hydrALAZINE (APRESOLINE) 50 MG tablet Take 1.5 tablets (75 mg total) by mouth 3 (three) times daily. (Patient taking differently: Take 50 mg by mouth 3 (three) times daily. ) 45 tablet 6 11/22/2015 at Unknown time  . HYDROcodone-acetaminophen (NORCO/VICODIN) 5-325 MG tablet Take 1 tablet by mouth 4 (four) times daily.    11/22/2015 at Unknown time  . isosorbide mononitrate (IMDUR) 30 MG 24 hr tablet Take 1 tablet (30 mg total) by mouth daily. 30 tablet 12 11/22/2015 at Unknown time  .  lisinopril (PRINIVIL,ZESTRIL) 40 MG tablet Take 40 mg by mouth 2 (two) times daily.     11/22/2015 at Unknown time  . metFORMIN (GLUCOPHAGE) 500 MG tablet Take 500 mg by mouth 2 (two) times daily with meals.     11/22/2015 at Unknown time  . potassium chloride SA (K-DUR,KLOR-CON) 20 MEQ tablet Take 2 tablets (40 mEq total) by mouth 2 (two) times daily. 120 tablet 5 11/22/2015 at Unknown time  . solifenacin (VESICARE) 5 MG tablet Take 5 mg by mouth daily.   11/22/2015 at Unknown time  . torsemide (DEMADEX) 20 MG tablet Take 1 tablet (20 mg total) by mouth 2 (two) times daily. 60 tablet 12 11/22/2015 at Unknown  time  . TRAVATAN Z 0.004 % SOLN ophthalmic solution Place 1 drop into both eyes at bedtime.    11/21/2015 at Unknown time  . warfarin (COUMADIN) 5 MG tablet Take 1 tablet daily or as directed (Patient taking differently: Take 2.5-5 mg by mouth every hour as needed. Take one tablet daily except take one-half tablet on Wednesdays and Sundays) 45 tablet 3 11/22/2015 at 1900   Scheduled: . aspirin EC  81 mg Oral Daily  . atorvastatin  40 mg Oral Daily  . darifenacin  7.5 mg Oral Daily  . diltiazem  120 mg Oral Daily  . hydrALAZINE  100 mg Oral TID  . insulin aspart  0-9 Units Subcutaneous Q4H  . isosorbide mononitrate  60 mg Oral Daily  . latanoprost  1 drop Both Eyes QHS  . lisinopril  40 mg Oral Daily  . pantoprazole  40 mg Oral Q0600  . sodium chloride flush  3 mL Intravenous Q12H  . sodium chloride flush  3 mL Intravenous Q12H   Continuous: . sodium chloride 75 mL/hr (11/26/15 1542)  . heparin    . nitroGLYCERIN     FXJ:OITGPQ chloride, acetaminophen, ALPRAZolam, morphine injection, nitroGLYCERIN, ondansetron **OR** ondansetron (ZOFRAN) IV, sodium chloride flush  Results for orders placed or performed during the hospital encounter of 11/22/15 (from the past 48 hour(s))  Glucose, capillary     Status: Abnormal   Collection Time: 11/24/15  8:26 PM  Result Value Ref Range   Glucose-Capillary 179 (H) 65 - 99 mg/dL  Glucose, capillary     Status: None   Collection Time: 11/25/15 12:05 AM  Result Value Ref Range   Glucose-Capillary 92 65 - 99 mg/dL  Glucose, capillary     Status: Abnormal   Collection Time: 11/25/15  4:13 AM  Result Value Ref Range   Glucose-Capillary 103 (H) 65 - 99 mg/dL  Protime-INR     Status: Abnormal   Collection Time: 11/25/15  4:17 AM  Result Value Ref Range   Prothrombin Time 20.9 (H) 11.4 - 15.2 seconds   INR 1.77   Heparin level (unfractionated)     Status: None   Collection Time: 11/25/15  4:17 AM  Result Value Ref Range   Heparin Unfractionated  0.53 0.30 - 0.70 IU/mL    Comment:        IF HEPARIN RESULTS ARE BELOW EXPECTED VALUES, AND PATIENT DOSAGE HAS BEEN CONFIRMED, SUGGEST FOLLOW UP TESTING OF ANTITHROMBIN III LEVELS.   CBC     Status: Abnormal   Collection Time: 11/25/15  4:17 AM  Result Value Ref Range   WBC 7.4 4.0 - 10.5 K/uL   RBC 3.16 (L) 3.87 - 5.11 MIL/uL   Hemoglobin 10.0 (L) 12.0 - 15.0 g/dL   HCT 29.8 (L) 36.0 - 46.0 %  MCV 94.3 78.0 - 100.0 fL   MCH 31.6 26.0 - 34.0 pg   MCHC 33.6 30.0 - 36.0 g/dL   RDW 13.9 11.5 - 15.5 %   Platelets 264 150 - 400 K/uL  Basic metabolic panel     Status: Abnormal   Collection Time: 11/25/15  4:17 AM  Result Value Ref Range   Sodium 134 (L) 135 - 145 mmol/L   Potassium 4.7 3.5 - 5.1 mmol/L    Comment: SLIGHT HEMOLYSIS   Chloride 101 101 - 111 mmol/L   CO2 24 22 - 32 mmol/L   Glucose, Bld 96 65 - 99 mg/dL   BUN 35 (H) 6 - 20 mg/dL   Creatinine, Ser 1.93 (H) 0.44 - 1.00 mg/dL   Calcium 8.6 (L) 8.9 - 10.3 mg/dL   GFR calc non Af Amer 23 (L) >60 mL/min   GFR calc Af Amer 27 (L) >60 mL/min    Comment: (NOTE) The eGFR has been calculated using the CKD EPI equation. This calculation has not been validated in all clinical situations. eGFR's persistently <60 mL/min signify possible Chronic Kidney Disease.    Anion gap 9 5 - 15  Glucose, capillary     Status: Abnormal   Collection Time: 11/25/15  7:24 AM  Result Value Ref Range   Glucose-Capillary 120 (H) 65 - 99 mg/dL  Glucose, capillary     Status: Abnormal   Collection Time: 11/25/15 11:34 AM  Result Value Ref Range   Glucose-Capillary 132 (H) 65 - 99 mg/dL  Glucose, capillary     Status: Abnormal   Collection Time: 11/25/15  4:30 PM  Result Value Ref Range   Glucose-Capillary 198 (H) 65 - 99 mg/dL  Glucose, capillary     Status: Abnormal   Collection Time: 11/25/15  8:34 PM  Result Value Ref Range   Glucose-Capillary 137 (H) 65 - 99 mg/dL  Glucose, capillary     Status: None   Collection Time: 11/25/15  11:37 PM  Result Value Ref Range   Glucose-Capillary 89 65 - 99 mg/dL  Protime-INR     Status: Abnormal   Collection Time: 11/26/15  3:43 AM  Result Value Ref Range   Prothrombin Time 18.2 (H) 11.4 - 15.2 seconds   INR 1.49   Heparin level (unfractionated)     Status: None   Collection Time: 11/26/15  3:43 AM  Result Value Ref Range   Heparin Unfractionated 0.47 0.30 - 0.70 IU/mL    Comment:        IF HEPARIN RESULTS ARE BELOW EXPECTED VALUES, AND PATIENT DOSAGE HAS BEEN CONFIRMED, SUGGEST FOLLOW UP TESTING OF ANTITHROMBIN III LEVELS.   CBC     Status: Abnormal   Collection Time: 11/26/15  3:43 AM  Result Value Ref Range   WBC 7.8 4.0 - 10.5 K/uL   RBC 3.09 (L) 3.87 - 5.11 MIL/uL   Hemoglobin 9.7 (L) 12.0 - 15.0 g/dL   HCT 29.0 (L) 36.0 - 46.0 %   MCV 93.9 78.0 - 100.0 fL   MCH 31.4 26.0 - 34.0 pg   MCHC 33.4 30.0 - 36.0 g/dL   RDW 13.8 11.5 - 15.5 %   Platelets 259 150 - 400 K/uL  Basic metabolic panel     Status: Abnormal   Collection Time: 11/26/15  3:43 AM  Result Value Ref Range   Sodium 134 (L) 135 - 145 mmol/L   Potassium 3.9 3.5 - 5.1 mmol/L    Comment: DELTA CHECK NOTED NO VISIBLE  HEMOLYSIS    Chloride 100 (L) 101 - 111 mmol/L   CO2 24 22 - 32 mmol/L   Glucose, Bld 109 (H) 65 - 99 mg/dL   BUN 30 (H) 6 - 20 mg/dL   Creatinine, Ser 1.78 (H) 0.44 - 1.00 mg/dL   Calcium 8.9 8.9 - 10.3 mg/dL   GFR calc non Af Amer 25 (L) >60 mL/min   GFR calc Af Amer 29 (L) >60 mL/min    Comment: (NOTE) The eGFR has been calculated using the CKD EPI equation. This calculation has not been validated in all clinical situations. eGFR's persistently <60 mL/min signify possible Chronic Kidney Disease.    Anion gap 10 5 - 15  Glucose, capillary     Status: Abnormal   Collection Time: 11/26/15  5:35 AM  Result Value Ref Range   Glucose-Capillary 137 (H) 65 - 99 mg/dL  Glucose, capillary     Status: Abnormal   Collection Time: 11/26/15  7:38 AM  Result Value Ref Range    Glucose-Capillary 117 (H) 65 - 99 mg/dL  I-STAT 3, venous blood gas (G3P V)     Status: Abnormal   Collection Time: 11/26/15 12:05 PM  Result Value Ref Range   pH, Ven 7.392 7.250 - 7.430   pCO2, Ven 41.4 (L) 44.0 - 60.0 mmHg   pO2, Ven 35.0 32.0 - 45.0 mmHg   Bicarbonate 25.1 20.0 - 28.0 mmol/L   TCO2 26 0 - 100 mmol/L   O2 Saturation 67.0 %   Patient temperature HIDE    Sample type VENOUS    Comment NOTIFIED PHYSICIAN   I-STAT 3, arterial blood gas (G3+)     Status: Abnormal   Collection Time: 11/26/15 12:06 PM  Result Value Ref Range   pH, Arterial 7.383 7.350 - 7.450   pCO2 arterial 43.1 32.0 - 48.0 mmHg   pO2, Arterial 119.0 (H) 83.0 - 108.0 mmHg   Bicarbonate 25.6 20.0 - 28.0 mmol/L   TCO2 27 0 - 100 mmol/L   O2 Saturation 99.0 %   Patient temperature HIDE    Sample type ARTERIAL   POCT Activated clotting time     Status: None   Collection Time: 11/26/15 12:28 PM  Result Value Ref Range   Activated Clotting Time 147 seconds  Glucose, capillary     Status: Abnormal   Collection Time: 11/26/15  1:02 PM  Result Value Ref Range   Glucose-Capillary 137 (H) 65 - 99 mg/dL  Glucose, capillary     Status: Abnormal   Collection Time: 11/26/15  4:52 PM  Result Value Ref Range   Glucose-Capillary 228 (H) 65 - 99 mg/dL   Comment 1 Capillary Specimen     No results found.  Review of Systems  Constitutional: Positive for malaise/fatigue. Negative for chills, fever and weight loss.  HENT: Negative.   Eyes: Negative.   Respiratory: Positive for shortness of breath. Negative for cough and sputum production.   Cardiovascular: Positive for chest pain. Negative for palpitations, orthopnea, leg swelling and PND.  Gastrointestinal: Negative.   Genitourinary: Negative.   Musculoskeletal: Negative.   Skin: Negative.   Neurological: Negative.   Endo/Heme/Allergies: Negative.   Psychiatric/Behavioral: Negative.    Blood pressure (!) 117/47, pulse (!) 51, temperature 98 F (36.7 C),  temperature source Oral, resp. rate 15, height '5\' 6"'  (1.676 m), weight 69.5 kg (153 lb 4.8 oz), SpO2 97 %. Physical Exam  Constitutional: She is oriented to person, place, and time.  Elderly woman in no  distress.  HENT:  Head: Normocephalic and atraumatic.  Mouth/Throat: Oropharynx is clear and moist.  Eyes: EOM are normal. Pupils are equal, round, and reactive to light.  Neck: Normal range of motion. No JVD present. No thyromegaly present.  Cardiovascular: Normal rate, regular rhythm, normal heart sounds and intact distal pulses.   No murmur heard. Respiratory: Effort normal and breath sounds normal. No respiratory distress. She has no rales.  GI: Soft. Bowel sounds are normal. She exhibits no distension and no mass. There is no tenderness.  Musculoskeletal: Normal range of motion. She exhibits no edema.  Lymphadenopathy:    She has no cervical adenopathy.  Neurological: She is alert and oriented to person, place, and time. She has normal strength. No cranial nerve deficit or sensory deficit.  Skin: Skin is warm and dry.  Psychiatric:  Flat affect.     [address] [place], [place] [address]                            [phone]  ------------------------------------------------------------------- Transthoracic Echocardiography  Patient:    Melissa [name], Melissa [name] MR #:       [id number] Study Date: 11/11/2015 Gender:     F Age:        25 Height:     165.1 cm Weight:     65.3 kg BSA:        1.74 m^2 Pt. Status: Room:       A331   Debby Freiberg [id number]  ATTENDING    Comer Locket, M.D.  REFERRING    Kerry Hough, M.D.  PERFORMING   [facility], [facility]  SONOGRAPHER  Alvino Chapel, RCS  cc:  ------------------------------------------------------------------- LV EF: 60% -    65%  ------------------------------------------------------------------- Indications:      Chest pain 786.51.  ------------------------------------------------------------------- History:   PMH:  Tricuspid Regurgitation  Congestive heart failure.  Risk factors:  Hypertension. Dyslipidemia.  ------------------------------------------------------------------- Study Conclusions  - Left ventricle: The cavity size was normal. Wall thickness was   increased in a pattern of mild LVH. Systolic function was normal.   The estimated ejection fraction was in the range of 60% to 65%.   There is akinesis of the mid-apicalanteroseptal and apical   myocardium. The study is not technically sufficient to allow   evaluation of LV diastolic function. - Aortic valve: Mildly calcified annulus. Trileaflet; mildly   thickened leaflets. - Mitral valve: Calcified annulus. There was trivial regurgitation. - Left atrium: The atrium was severely dilated. - Right atrium: The atrium was severely dilated. Central venous   pressure (est): 8 mm Hg. - Tricuspid valve: There was moderate regurgitation. - Pulmonary arteries: Systolic pressure was severely increased. PA   peak pressure: 84 mm Hg (S). - Pericardium, extracardiac: A trivial pericardial effusion was   identified posterior to the heart.  Impressions:  - Mild LVH with LVEF 60-65%. There is akinesis of the mid to apical   anteroseptal wall and apex with associated prominent   trabeculation, no obvious LV mural thrombus. Indeterminate   diastolic function. Severe biatrial enlargement. MAC with trivial  mitral regurgitation. Mildly sclerotic aortic valve. Moderate   tricuspid regurgitation with evidence of severe pulmonary   hypertension, PASP estimated 84 mmHg. Trivial posterior   pericardial effusion.  ------------------------------------------------------------------- Study data:  Comparison was made to the study of 02/18/2015.   Study status:  Routine.  Procedure:  The patient reported no pain pre or post test. Transthoracic echocardiography. Image quality was adequate.  Study completion:  There were no complications. Transthoracic echocardiography.  M-mode, complete 2D, spectral Doppler, and color Doppler.  Birthdate:  Patient birthdate: April 27, 1930.  Age:  Patient is 83 yr old.  Sex:  Gender: female. BMI: 24 kg/m^2.  Blood pressure:     119/64  Patient status: Inpatient.  Study date:  Study date: 11/11/2015. Study time: 12:38 PM.  Location:  Bedside.  -------------------------------------------------------------------  ------------------------------------------------------------------- Left ventricle:  The cavity size was normal. Wall thickness was increased in a pattern of mild LVH. Systolic function was normal. The estimated ejection fraction was in the range of 60% to 65%. Regional wall motion abnormalities:   There is akinesis of the mid-apicalanteroseptal and apical myocardium. The study is not technically sufficient to allow evaluation of LV diastolic function.  ------------------------------------------------------------------- Aortic valve:   Mildly calcified annulus. Trileaflet; mildly thickened leaflets.  Doppler:  There was no significant regurgitation.  ------------------------------------------------------------------- Aorta:  Aortic root: The aortic root was normal in size.  ------------------------------------------------------------------- Mitral valve:   Calcified annulus.  Doppler:  There was trivial regurgitation.    Peak gradient (D): 3 mm Hg.  ------------------------------------------------------------------- Left atrium:  The atrium was severely dilated.  ------------------------------------------------------------------- Right ventricle:  The cavity size was normal. Systolic function  was normal.  ------------------------------------------------------------------- Pulmonic valve:    The valve appears to be grossly normal. Doppler:  There was trivial regurgitation.  ------------------------------------------------------------------- Tricuspid valve:   Mildly thickened leaflets.  Doppler:  There was moderate regurgitation.  ------------------------------------------------------------------- Pulmonary artery:   Systolic pressure was severely increased.  ------------------------------------------------------------------- Right atrium:  The atrium was severely dilated.  ------------------------------------------------------------------- Pericardium:  A trivial pericardial effusion was identified posterior to the heart.  ------------------------------------------------------------------- Systemic veins: Inferior vena cava: The vessel was dilated. The respirophasic diameter changes were blunted (< 50%).  ------------------------------------------------------------------- Measurements   Left ventricle                           Value        Reference  LV ID, ED, PLAX chordal                  43    mm     43 - 52  LV ID, ES, PLAX chordal          (L)     21.6  mm     23 - 38  LV fx shortening, PLAX chordal           50    %      >=29  LV PW thickness, ED                      12.1  mm     ---------  IVS/LV PW ratio, ED                      1.19         <=1.3  Stroke volume, 2D  51    ml     ---------  Stroke volume/bsa, 2D                    29    ml/m^2 ---------  LV ejection fraction, 1-p A4C            64    %      ---------  LV end-diastolic volume, 2-p             59    ml     ---------  LV end-diastolic volume/bsa, 2-p         34    ml/m^2 ---------  LV e&', lateral                           6.85  cm/s   ---------  LV E/e&', lateral                         12.15        ---------  LV e&', medial                            5.87   cm/s   ---------  LV E/e&', medial                          14.17        ---------  LV e&', average                           6.36  cm/s   ---------  LV E/e&', average                         13.08        ---------    Ventricular septum                       Value        Reference  IVS thickness, ED                        14.4  mm     ---------    LVOT                                     Value        Reference  LVOT ID, S                               17    mm     ---------  LVOT area                                2.27  cm^2   ---------  LVOT peak velocity, S                    111   cm/s   ---------  LVOT mean velocity, S                    66.7  cm/s   ---------  LVOT VTI, S                              22.4  cm     ---------  LVOT peak gradient, S                    5     mm Hg  ---------    Aorta                                    Value        Reference  Aortic root ID, ED                       32    mm     ---------    Left atrium                              Value        Reference  LA ID, A-P, ES                           46    mm     ---------  LA ID/bsa, A-P                   (H)     2.65  cm/m^2 <=2.2  LA volume, S                             133   ml     ---------  LA volume/bsa, S                         76.5  ml/m^2 ---------  LA volume, ES, 1-p A4C                   142   ml     ---------  LA volume/bsa, ES, 1-p A4C               81.7  ml/m^2 ---------  LA volume, ES, 1-p A2C                   121   ml     ---------  LA volume/bsa, ES, 1-p A2C               69.6  ml/m^2 ---------    Mitral valve                             Value        Reference  Mitral E-wave peak velocity              83.2  cm/s   ---------  Mitral A-wave peak velocity              23.3  cm/s   ---------  Mitral deceleration time                 155   ms     150 - 230  Mitral peak gradient, D  3     mm Hg  ---------  Mitral E/A ratio, peak                   3.6          ---------     Pulmonary arteries                       Value        Reference  PA pressure, S, DP               (H)     84    mm Hg  <=30    Tricuspid valve                          Value        Reference  Tricuspid regurg peak velocity           437   cm/s   ---------  Tricuspid peak RV-RA gradient            76    mm Hg  ---------    Systemic veins                           Value        Reference  Estimated CVP                            8     mm Hg  ---------    Right ventricle                          Value        Reference  RV ID, ED, PLAX                          29    mm     19 - 38  TAPSE                                    12    mm     ---------  RV pressure, S, DP               (H)     84    mm Hg  <=30  RV s&', lateral, S                        11.2  cm/s   ---------  Legend: (L)  and  (H)  mark values outside specified reference range.  ------------------------------------------------------------------- Prepared and Electronically Authenticated by  Rozann Lesches, M.D. 2017-10-04T14:30:45   Re[facility] Forth  Cardiac catheterization  Order# [id number]  Reading physician: Nelva Bush, MD Ordering physician: Nelva Bush, MD Study date: 11/26/15  Physicians   Panel Physicians Referring Physician Case Authorizing Physician  Nelva Bush, MD (Primary)    Procedures   Right/Left Heart Cath and Coronary Angiography  Conclusion   Conclusions: 1.  Severe, heavily calcified three-vessel coronary artery disease (as described below), including chronic total occlusion of the mid LAD, 90% proximal LCx stenosis, and 90% ostial RCA lesion with moderate diffuse disease throughout the remainder of the RCA. 2.  Normal left and right heart  filling pressures. 3.  Mild to moderate pulmonary hypertension. 4.  Normal cardiac output/index. 5.  Tortuous and calcified thoracic and abdominal aorta.  Recommendations: 1.  Transfer to 2 Heart stepdown for aggressive medical therapy  including nitroglycerin infusion to manage patient's chest pain. 2.   Cardiac surgery evaluation for CABG. The patient's coronary anatomy and comorbidities make her a poor candidate for percutaneous revascularization. 3.  Restart heparin infusion 8 hours after sheath removal.  Nelva Bush, MD [facility] Pager: [phone]    Indications   Unstable angina (HCC) [I20.0 (ICD-10-CM)]  Pulmonary hypertension [I27.20 (ICD-10-CM)]  Procedural Details/Technique   Technical Details Indication: 82 y.o. year-old woman with history of diabetes, hypertension, hyperlipidemia, chronic kidney disease with baseline creatinine near 2, severe pulmonary hypertension based on recent echocardiogram, and atrial fibrillation on warfarin, who was transferred from [facility] due to recurrent chest pain. The patient had an STEMI earlier this month and returned with chest pain at rest. She had a minimal troponin elevation, which is nonspecific in the setting of her CK D. However, given continued chest pain despite aggressive medical therapy, the patient has been referred for left and right heart catheterization with possible PCI.  GFR: 29 mmHg  Procedure: The risks, benefits, complications, treatment options, and expected outcomes were discussed with the patient. The patient and/or family concurred with the proposed plan, giving informed consent. The patient was brought to the cath lab after IV hydration was begun and oral premedication was given. The patient was further sedated with Versed and Fentanyl. The right groin was prepped and draped in the usual manner. Using the modified Seldinger access technique and a 32F micropuncture technique, a 19F and 72F sheaths was placed in the right femoral vein and artery, respectively. This was performed under real-time ultrasound guidance, which was notable for heavy calcification of the right common femoral artery. An ultrasound image was saved.  Right heart  catheterization was performed by advancing a 19F balloontipped catheter through the right heart chambers into the pulmonary capillary wedge position. A 0.025" angled Glidewire was used to help stiffen the catheter to allow advancement into the pulmonary artery. Oxygen saturations were obtained and thermodilution measurements performed.  Selective coronary angiography was performed using 72F JL4 and JR4 catheters to engage the left and right coronary arteries, respectively. Left heart catheterization was performed using a 72F JR4 catheter. Left ventriculogram was not performed due to chronic kidney disease.  There were no immediate complications. The patient was taken to the recovery area in stable condition.   Contrast used: 35 mL Isovue Fluoroscopy time: 9.6 min Radiation dose: 281 mGy   Estimated blood loss <50 mL.  During this procedure the patient was administered the following to achieve and maintain moderate conscious sedation: Versed 1 mg, Fentanyl 25 mcg, while the patient's heart rate, blood pressure, and oxygen saturation were continuously monitored. The period of conscious sedation was 47 minutes, of which I was present face-to-face 100% of this time.    Complications   Complications documented before study signed (11/26/2015 1:42 PM EDT)    No complications were associated with this study.  Documented by Nelva Bush, MD - 11/26/2015 1:40 PM EDT    Coronary Findings   Dominance: Right  Left Main  Vessel is large.  LM lesion, 20% stenosed. The lesion is eccentric. The lesion is calcified.  Left Anterior Descending  Dist LAD filled by collaterals from 2nd Diag.  Ost LAD lesion, 30% stenosed. The lesion is calcified.  Prox LAD  lesion, 20% stenosed. The lesion is irregular. The lesion is calcified.  Mid LAD lesion, 100% stenosed. The lesion is calcified.  Dist LAD lesion, 40% stenosed. The lesion is irregular. The lesion is calcified.  First Diagonal Branch  Vessel is  moderate in size. There is moderate disease in the vessel.  1st Diag lesion, 90% stenosed. The lesion is calcified.  Second Diagonal Branch  Vessel is large in size.  2nd Diag lesion, 50% stenosed. The lesion is calcified.  Left Circumflex  Vessel is large.  Prox Cx lesion, 90% stenosed. The lesion is calcified.  First Obtuse Marginal Branch  Vessel is moderate in size.  Second Obtuse Marginal Branch  Vessel is moderate in size.  2nd Mrg lesion, 40% stenosed.  Right Coronary Artery  Vessel is large.  Ost RCA lesion, 90% stenosed. Marked pressure dampening with 10F catheter engagement  Prox RCA lesion, 40% stenosed. The lesion is calcified.  Mid RCA lesion, 50% stenosed. The lesion is severely calcified.  Dist RCA-1 lesion, 50% stenosed. The lesion is calcified.  Dist RCA-2 lesion, 50% stenosed. The lesion is calcified.  Right Posterior Descending Artery  Vessel is moderate in size.  Right Posterior Atrioventricular Branch  Vessel is moderate in size.  First Right Posterolateral  Vessel is moderate in size.  Second Right Posterolateral  Vessel is small in size.  Third Right Posterolateral  Vessel is moderate in size.  Right Heart   Right Heart Pressures Low normal right atrial pressure (mean RA 3 mmHg). Mild to moderate pulmonary hypertension (mean PA 33 mmHg). Upper normal pulmonary capillary wedge pressure with prominent V-waves (mean PCWP 16 mmHg). Normal Fick and thermodilution cardiac output.    Left Heart   Left Ventricle LV end diastolic pressure is normal.    Coronary Diagrams   Diagnostic Diagram     Implants     No implant documentation for this case.  PACS Images   Show images for Cardiac catheterization   Link to Procedure Log   Procedure Log    Hemo Data   Flowsheet Row Most Recent Value  Fick Cardiac Output 5.64 L/min  Fick Cardiac Output Index 3.15 (L/min)/BSA  Thermal Cardiac Output 4.68 L/min  Thermal Cardiac Output Index 2.61 (L/min)/BSA   RA A Wave 2 mmHg  RA V Wave 6 mmHg  RA Mean 3 mmHg  RV Systolic Pressure 59 mmHg  RV Diastolic Pressure -1 mmHg  RV EDP 2 mmHg  PA Systolic Pressure 58 mmHg  PA Diastolic Pressure 14 mmHg  PA Mean 33 mmHg  PW A Wave 16 mmHg  PW V Wave 29 mmHg  PW Mean 16 mmHg  AO Systolic Pressure 920 mmHg  AO Diastolic Pressure 62 mmHg  AO Mean 92 mmHg  LV Systolic Pressure 100 mmHg  LV Diastolic Pressure 9 mmHg  LV EDP 11 mmHg  Arterial Occlusion Pressure Extended Systolic Pressure 712 mmHg  Arterial Occlusion Pressure Extended Diastolic Pressure 71 mmHg  Arterial Occlusion Pressure Extended Mean Pressure 104 mmHg  Left Ventricular Apex Extended Systolic Pressure 197 mmHg  Left Ventricular Apex Extended Diastolic Pressure 3 mmHg  Left Ventricular Apex Extended EDP Pressure 9 mmHg  TPVR Index 12.63 HRUI  TSVR Index 35.19 HRUI  PVR SVR Ratio 0.19  TPVR/TSVR Ratio 0.36     Assessment/Plan:  I have personally reviewed and interpreted her echo and cath films. She has severe 3-vessel calcific coronary artery disease with overall normal LV function and no significant valvular abnormality. Her atria are both markedly  dilated and she has moderate TR but no significant MR. Her coronary disease is no ideal for PCI but I am concerned about her ascending aorta. There is linear calcification of wall on both sides in the proximal ascending aorta and I think a CT scan of the chest without contrast is indicated to evaluate this further. If she has concentric calcification of the ascending aorta then CABG is not an option. If we can do CABG it will be at high risk due to her age, stage IV chronic kidney disease, DM, and other comorbid factors. I discussed the cath results with the patient and her daughter and granddaughter, potential treatment with CABG and the risks involved. I answered their questions. I will order a CT of the chest without contrast for the morning.  I spent 80 minutes performing this  consultation and > 50% of this time was spent face to face counseling and coordinating the care of this patient's severe multi-vessel coronary artery disease.  Fernande Boyden Ka Bench 11/26/2015, 6:06 PM

## 2015-11-26 NOTE — Care Management Important Message (Signed)
Important Message  Patient Details  Name: Melissa [name] MRN: [id number] Date of Birth: [birth year]   Medicare Important Message Given:  Yes    Lew Prout Abena 11/26/2015, 10:45 AM

## 2015-11-26 NOTE — Progress Notes (Signed)
Patient Name: Melissa [name] Date of Encounter: 11/26/2015  Primary Cardiologist: Dr. Leroy Libman Problem List     Active Problems:   Unstable angina [facility])   Chronic kidney disease (CKD), stage IV (severe) (HCC)     Subjective   Feels well this am, denies chest pain and SOB.    Inpatient Medications    Scheduled Meds: . aspirin EC  81 mg Oral Daily  . atorvastatin  40 mg Oral Daily  . darifenacin  7.5 mg Oral Daily  . diltiazem  120 mg Oral Daily  . hydrALAZINE  100 mg Oral TID  . insulin aspart  0-9 Units Subcutaneous Q4H  . isosorbide mononitrate  60 mg Oral Daily  . latanoprost  1 drop Both Eyes QHS  . lisinopril  40 mg Oral Daily  . pantoprazole  40 mg Oral Q0600  . sodium chloride flush  3 mL Intravenous Q12H  . sodium chloride flush  3 mL Intravenous Q12H  . torsemide  20 mg Oral BID   Continuous Infusions: . sodium chloride 1 mL/kg/hr (11/25/15 2130)  . heparin 900 Units/hr (11/25/15 1708)  . nitroGLYCERIN Stopped (11/24/15 0817)   PRN Meds: sodium chloride, acetaminophen, ALPRAZolam, ondansetron **OR** ondansetron (ZOFRAN) IV, sodium chloride flush   Vital Signs    Vitals:   11/25/15 2340 11/26/15 0537 11/26/15 0739 11/26/15 0750  BP: 131/69 (!) 153/69 135/75 138/66  Pulse: (!) 57 72 (!) 55 (!) 55  Resp: 14 18 15    Temp: 98.2 F (36.8 C) 98.2 F (36.8 C) 98.7 F (37.1 C) 98.6 F (37 C)  TempSrc: Oral Oral Oral Oral  SpO2: 100% 100% 100%   Weight:  153 lb 4.8 oz (69.5 kg)    Height:        Intake/Output Summary (Last 24 hours) at 11/26/15 0759 Last data filed at 11/26/15 0700  Gross per 24 hour  Intake          1311.35 ml  Output             2400 ml  Net         -1088.65 ml   Filed Weights   11/24/15 0448 11/25/15 0440 11/26/15 0537  Weight: 153 lb 3.2 oz (69.5 kg) 153 lb 3.2 oz (69.5 kg) 153 lb 4.8 oz (69.5 kg)    Physical Exam   GEN: Well nourished, well developed, in no acute distress.  HEENT: Grossly normal.  Neck:  Supple, no JVD, carotid bruits, or masses. Cardiac: RRR, no murmurs, rubs, or gallops. No clubbing, cyanosis, edema.  Radials/DP/PT 2+ and equal bilaterally.  Respiratory:  Respirations regular and unlabored, clear to auscultation bilaterally. GI: Soft, nontender, nondistended, BS + x 4. MS: no deformity or atrophy. Skin: warm and dry, no rash. Neuro:  Strength and sensation are intact. Psych: AAOx3.  Normal affect.  Labs    CBC  Recent Labs  11/25/15 0417 11/26/15 0343  WBC 7.4 7.8  HGB 10.0* 9.7*  HCT 29.8* 29.0*  MCV 94.3 93.9  PLT 264 259   Basic Metabolic Panel  Recent Labs  11/25/15 0417 11/26/15 0343  NA 134* 134*  K 4.7 3.9  CL 101 100*  CO2 24 24  GLUCOSE 96 109*  BUN 35* 30*  CREATININE 1.93* 1.78*  CALCIUM 8.6* 8.9   Cardiac Enzymes  Recent Labs  11/23/15 1250  TROPONINI 0.07*     Telemetry    NSR - Personally Reviewed  ECG     NSR,  ST depression and T wave inversion in anterolateral leads. - Personally Reviewed  Radiology    No results found.  Cardiac Studies  Transthoracic Echocardiography 11/11/15  Study Conclusions  - Left ventricle: The cavity size was normal. Wall thickness was   increased in a pattern of mild LVH. Systolic function was normal.   The estimated ejection fraction was in the range of 60% to 65%.   There is akinesis of the mid-apicalanteroseptal and apical   myocardium. The study is not technically sufficient to allow   evaluation of LV diastolic function. - Aortic valve: Mildly calcified annulus. Trileaflet; mildly   thickened leaflets. - Mitral valve: Calcified annulus. There was trivial regurgitation. - Left atrium: The atrium was severely dilated. - Right atrium: The atrium was severely dilated. Central venous   pressure (est): 8 mm Hg. - Tricuspid valve: There was moderate regurgitation. - Pulmonary arteries: Systolic pressure was severely increased. PA   peak pressure: 84 mm Hg (S). - Pericardium,  extracardiac: A trivial pericardial effusion was   identified posterior to the heart.  Impressions:  - Mild LVH with LVEF 60-65%. There is akinesis of the mid to apical   anteroseptal wall and apex with associated prominent   trabeculation, no obvious LV mural thrombus. Indeterminate   diastolic function. Severe biatrial enlargement. MAC with trivial   mitral regurgitation. Mildly sclerotic aortic valve. Moderate   tricuspid regurgitation with evidence of severe pulmonary   hypertension, PASP estimated 84 mmHg. Trivial posterior   pericardial effusion.  Patient Profile  Melissa [name] is a 82 y.o. female with medical history significant of DM type 2, CKD with Cr around 2, hyperlipidemia, HTN, A-fib which she is on coumadin, and diastolic CHF presented to the ED with complaints of CP, has known CAD with with recent NSTEMI with no invasive evaluation at that time.   Assessment & Plan    1. Unstable angina: Pt with no known CAD but recent NSTEMI November 10, 2015. No invasive evaluation at that time. Now [facility] to [facility] with chest pain, mild troponin elevation. [facility]. She has been on chronic coumadin. INR is 1.49 today, for cath today.   She is on IV heparin. Will continue ASA, statin, Imdur, Cardizem.  2. Chronic diastolic CHF: She is on home dose of Torsemide. Volume status is ok.   3. Anemia: H/H is stable. No active bleeding.   4. HTN: BP controlled. Lisinopril lowered to 40 mg daily 11/23/15 and Hydralazine increased.    Signed, Little IshikawaErin E Smith, NP  11/26/2015, 7:59 AM   I have personally seen and examined this patient with Suzzette RighterErin Smith, NP. I agree with the assessment and plan as outlined above. She is admitted with unstable angina to [facility]. Transferred here for cath. INR is below 1.5. Recurrent chest pain this am. Will give SL NTG now. Cath lab is calling for pt now.   Verne CarrowChristopher  Morgin Halls 11/26/2015 10:53 AM

## 2015-11-27 ENCOUNTER — Encounter (HOSPITAL_COMMUNITY): Payer: Self-pay | Admitting: Internal Medicine

## 2015-11-27 ENCOUNTER — Inpatient Hospital Stay (HOSPITAL_COMMUNITY): Payer: Medicare Other

## 2015-11-27 LAB — PROTIME-INR
INR: 1.4
Prothrombin Time: 17.3 seconds — ABNORMAL HIGH (ref 11.4–15.2)

## 2015-11-27 LAB — HEPARIN LEVEL (UNFRACTIONATED)
HEPARIN UNFRACTIONATED: 0.46 [IU]/mL (ref 0.30–0.70)
Heparin Unfractionated: 0.2 IU/mL — ABNORMAL LOW (ref 0.30–0.70)
Heparin Unfractionated: 0.38 [IU]/mL (ref 0.30–0.70)

## 2015-11-27 LAB — BASIC METABOLIC PANEL
ANION GAP: 7 (ref 5–15)
BUN: 26 mg/dL — ABNORMAL HIGH (ref 6–20)
CHLORIDE: 104 mmol/L (ref 101–111)
CO2: 24 mmol/L (ref 22–32)
Calcium: 8.8 mg/dL — ABNORMAL LOW (ref 8.9–10.3)
Creatinine, Ser: 1.6 mg/dL — ABNORMAL HIGH (ref 0.44–1.00)
GFR calc Af Amer: 33 mL/min — ABNORMAL LOW (ref >60)
GFR calc non Af Amer: 29 mL/min — ABNORMAL LOW (ref >60)
GLUCOSE: 85 mg/dL (ref 65–99)
POTASSIUM: 4 mmol/L (ref 3.5–5.1)
Sodium: 135 mmol/L (ref 135–145)

## 2015-11-27 LAB — GLUCOSE, CAPILLARY
GLUCOSE-CAPILLARY: 198 mg/dL — AB (ref 65–99)
GLUCOSE-CAPILLARY: 98 mg/dL (ref 65–99)
GLUCOSE-CAPILLARY: 99 mg/dL (ref 65–99)
Glucose-Capillary: 200 mg/dL — ABNORMAL HIGH (ref 65–99)
Glucose-Capillary: 137 mg/dL — ABNORMAL HIGH (ref 65–99)
Glucose-Capillary: 180 mg/dL — ABNORMAL HIGH (ref 65–99)

## 2015-11-27 LAB — CBC
HEMATOCRIT: 27.6 % — AB (ref 36.0–46.0)
HEMOGLOBIN: 9.2 g/dL — AB (ref 12.0–15.0)
MCH: 31.5 pg (ref 26.0–34.0)
MCHC: 33.3 g/dL (ref 30.0–36.0)
MCV: 94.5 fL (ref 78.0–100.0)
Platelets: 232 10*3/uL (ref 150–400)
RBC: 2.92 MIL/uL — ABNORMAL LOW (ref 3.87–5.11)
RDW: 13.7 % (ref 11.5–15.5)
WBC: 7.4 10*3/uL (ref 4.0–10.5)

## 2015-11-27 LAB — MRSA PCR SCREENING: MRSA by PCR: NEGATIVE

## 2015-11-27 MED ORDER — CLOPIDOGREL BISULFATE 300 MG PO TABS
600.0000 mg | ORAL_TABLET | Freq: Once | ORAL | Status: AC
  Administered 2015-11-27: 600 mg via ORAL
  Filled 2015-11-27: qty 2

## 2015-11-27 MED ORDER — METOPROLOL TARTRATE 25 MG PO TABS
25.0000 mg | ORAL_TABLET | Freq: Two times a day (BID) | ORAL | Status: DC
  Administered 2015-11-27 – 2015-12-04 (×11): 25 mg via ORAL
  Filled 2015-11-27 (×13): qty 1

## 2015-11-27 MED ORDER — ORAL CARE MOUTH RINSE
15.0000 mL | Freq: Two times a day (BID) | OROMUCOSAL | Status: DC
  Administered 2015-11-27 – 2015-12-05 (×15): 15 mL via OROMUCOSAL

## 2015-11-27 MED ORDER — CLOPIDOGREL BISULFATE 75 MG PO TABS
75.0000 mg | ORAL_TABLET | Freq: Every day | ORAL | Status: DC
  Administered 2015-11-28 – 2015-12-05 (×7): 75 mg via ORAL
  Filled 2015-11-27 (×7): qty 1

## 2015-11-27 MED FILL — Verapamil HCl IV Soln 2.5 MG/ML: INTRAVENOUS | Qty: 2 | Status: AC

## 2015-11-27 NOTE — Progress Notes (Signed)
ANTICOAGULATION CONSULT NOTE  Pharmacy Consult for Heparin Indication: chest pain/ACS and atrial fibrillation  No Known Allergies  Patient Measurements: Height: 5\' 6"  (167.6 cm) Weight: 153 lb 4.8 oz (69.5 kg) IBW/kg (Calculated) : 59.3 HEPARIN DW (KG): 69.5   Vital Signs: Temp: 99 F (37.2 C) (10/20 1641) Temp Source: Oral (10/20 1641) BP: 114/94 (10/20 1641) Pulse Rate: 50 (10/20 1641)  Labs:  Recent Labs  11/25/15 0417 11/26/15 0343 11/27/15 0252 11/27/15 0300 11/27/15 1130 11/27/15 1846  HGB 10.0* 9.7*  --  9.2*  --   --   HCT 29.8* 29.0*  --  27.6*  --   --   PLT 264 259  --  232  --   --   LABPROT 20.9* 18.2*  --  17.3*  --   --   INR 1.77 1.49  --  1.40  --   --   HEPARINUNFRC 0.53 0.47 0.20*  --  0.46 0.38  CREATININE 1.93* 1.78*  --  1.60*  --   --    Estimated Creatinine Clearance: 25.4 mL/min (by C-G formula based on SCr of 1.6 mg/dL (H)).  Assessment: 82 y.o. female with chest pain and h/o Afib. S/p cardiac cath 10/19. She is on warfarin PTA for AFib (5 mg by mouth daily except 2.5 mg by mouth on Wednesdays and Sundays). Current INR 1.4. Heparin level therapeutic x2. CBC stable, no bleeding noted.   Goal of Therapy:  Heparin level 0.3-0.7 units/ml Monitor platelets by anticoagulation protocol: Yes   Plan:  -Continue heparin at 1000 units/hr -Daily HL, CBC -F/u plans for restarting warfarin    Agapito GamesAlison Nur Rabold, PharmD, BCPS Clinical Pharmacist 11/27/2015 8:01 PM

## 2015-11-27 NOTE — Progress Notes (Signed)
     SUBJECTIVE: Chest pain earlier this am. NO chest pain currently. No dyspnea.   Tele: atrial fib, rate 70s  BP (!) 166/84   Pulse 72   Temp 99 F (37.2 C) (Oral)   Resp 19   Ht 5\' 6"  (1.676 m)   Wt 153 lb 4.8 oz (69.5 kg)   SpO2 100%   BMI 24.74 kg/m   Intake/Output Summary (Last 24 hours) at 11/27/15 0800 Last data filed at 11/27/15 0440  Gross per 24 hour  Intake                0 ml  Output             1150 ml  Net            -1150 ml    PHYSICAL EXAM General: Well developed, well nourished, in no acute distress. Alert and oriented x 3.  Psych:  Flat affect, responds appropriately Neck: No JVD. No masses noted.  Lungs: Clear bilaterally with no wheezes or rhonci noted.  Heart: Irreg irreg with no murmurs noted. Abdomen: Bowel sounds are present. Soft, non-tender.  Extremities: No lower extremity edema.   LABS: Basic Metabolic Panel:  Recent Labs  91/47/8210/19/17 0343 11/27/15 0300  NA 134* 135  K 3.9 4.0  CL 100* 104  CO2 24 24  GLUCOSE 109* 85  BUN 30* 26*  CREATININE 1.78* 1.60*  CALCIUM 8.9 8.8*   CBC:  Recent Labs  11/26/15 0343 11/27/15 0300  WBC 7.8 7.4  HGB 9.7* 9.2*  HCT 29.0* 27.6*  MCV 93.9 94.5  PLT 259 232   Current Meds: . aspirin EC  81 mg Oral Daily  . atorvastatin  40 mg Oral Daily  . darifenacin  7.5 mg Oral Daily  . diltiazem  120 mg Oral Daily  . hydrALAZINE  100 mg Oral TID  . insulin aspart  0-9 Units Subcutaneous Q4H  . isosorbide mononitrate  60 mg Oral Daily  . latanoprost  1 drop Both Eyes QHS  . lisinopril  40 mg Oral Daily  . pantoprazole  40 mg Oral Q0600  . sodium chloride flush  3 mL Intravenous Q12H  . sodium chloride flush  3 mL Intravenous Q12H    ASSESSMENT AND PLAN: 82 y.o.femalewith medical history significant for DM, CKD with baseline creatinine 2.0, HLD, HTN, atrial fib on coumadin, diastolic CHF who [facility] with unstable angina.   1. CAD/Unstable  angina: Cardiac cath 11/26/15 per Dr. Okey DupreEnd. Pt with severe 3 vessel CAD including occluded mid LAD, high grade proximal RCA stenosis and high grade proximal Circumflex stenosis. She has been seen by CT surgery to discuss CABG. She is not a great candidate for CABG. CTA chest today to assess the degree of calcification of her aorta. Will continue ASA, statin, Imdur. Will change Diltiazem to Lopressor given new finding of CAD. Continue heparin drip today.  2. Chronic diastolic CHF: Volume status is ok. Torsemide held post cath.   3. Anemia: H/H is stable. No active bleeding.   4. HTN: BP controlled.   5. Atrial fibrillation: Rate controlled. Will change cardizem to Lopressor given new finding of CAD in setting of unstable angina.  6. Chronic kidney disease, stage 4: Renal function stable post cath.   Melissa [name]  10/20/20178:00 AM

## 2015-11-27 NOTE — Progress Notes (Signed)
ANTICOAGULATION CONSULT NOTE  Pharmacy Consult for Heparin Indication: chest pain/ACS and atrial fibrillation  No Known Allergies  Patient Measurements: Height: 5\' 6"  (167.6 cm) Weight: 153 lb 4.8 oz (69.5 kg) IBW/kg (Calculated) : 59.3 HEPARIN DW (KG): 69.5   Vital Signs: Temp: 99 F (37.2 C) (10/20 0344) Temp Source: Oral (10/20 0344) BP: 123/51 (10/20 0000) Pulse Rate: 53 (10/20 0000)  Labs:  Recent Labs  11/25/15 0417 11/26/15 0343 11/27/15 0252 11/27/15 0300  HGB 10.0* 9.7*  --  9.2*  HCT 29.8* 29.0*  --  27.6*  PLT 264 259  --  232  LABPROT 20.9* 18.2*  --  17.3*  INR 1.77 1.49  --  1.40  HEPARINUNFRC 0.53 0.47 0.20*  --   CREATININE 1.93* 1.78*  --   --    Estimated Creatinine Clearance: 22.8 mL/min (by C-G formula based on SCr of 1.78 mg/dL (H)).  Assessment: 82 y.o. female with chest pain and h/o Afib. S/p cardiac cath 10/19. She is on warfarin PTA for AFib. Current INR 1.4. Heparin level subtherapeutic (0.2) on gtt at 900 units/hr. Previously therapeutic on this rate. No issues with line or bleeding reported per RN. CBC stable.  Goal of Therapy:  Heparin level 0.3-0.7 units/ml Monitor platelets by anticoagulation protocol: Yes   Plan:  Increase heparin to 1000 units/hr  Will f/u 8hr heparin level  Christoper Fabianaron Sheran Newstrom, PharmD, BCPS Clinical pharmacist, pager [phone] 11/27/2015 3:55 AM

## 2015-11-27 NOTE — Progress Notes (Signed)
1 Day Post-Op Procedure(s) (LRB): Right/Left Heart Cath and Coronary Angiography (N/A) Subjective:  Had some chest pain this am.  Objective: Vital signs in last 24 hours: Temp:  [98 F (36.7 C)-99.3 F (37.4 C)] 98.1 F (36.7 C) (10/20 1100) Pulse Rate:  [50-91] 56 (10/20 1200) Cardiac Rhythm: Atrial fibrillation (10/20 0800) Resp:  [10-20] 14 (10/20 1200) BP: (109-173)/(47-86) 123/59 (10/20 1200) SpO2:  [96 %-100 %] 99 % (10/20 1200)  Hemodynamic parameters for last 24 hours:    Intake/Output from previous day: 10/19 0701 - 10/20 0700 In: 16 [I.V.:16] Out: 1150 [Urine:1150] Intake/Output this shift: Total I/O In: 721 [P.O.:620; I.V.:101] Out: -   General appearance: alert, cooperative and flat affect Heart: regular rate and rhythm, S1, S2 normal, no murmur, click, rub or gallop Lungs: clear to auscultation bilaterally  Lab Results:  Recent Labs  11/26/15 0343 11/27/15 0300  WBC 7.8 7.4  HGB 9.7* 9.2*  HCT 29.0* 27.6*  PLT 259 232   BMET:  Recent Labs  11/26/15 0343 11/27/15 0300  NA 134* 135  K 3.9 4.0  CL 100* 104  CO2 24 24  GLUCOSE 109* 85  BUN 30* 26*  CREATININE 1.78* 1.60*  CALCIUM 8.9 8.8*    PT/INR:  Recent Labs  11/27/15 0300  LABPROT 17.3*  INR 1.40   ABG    Component Value Date/Time   PHART 7.383 11/26/2015 1206   HCO3 25.6 11/26/2015 1206   TCO2 27 11/26/2015 1206   ACIDBASEDEF 0.3 09/29/2008 1515   O2SAT 99.0 11/26/2015 1206   CBG (last 3)   Recent Labs  11/27/15 0349 11/27/15 0822 11/27/15 1319  GLUCAP 98 99 200*   CLINICAL DATA:  82 year old female with shortness of breath and chest pain. Evaluate proximal thoracic aortic calcifications prior to possible CABG.  EXAM: CT CHEST WITHOUT CONTRAST  TECHNIQUE: Multidetector CT imaging of the chest was performed following the standard protocol without IV contrast.  COMPARISON:  11/22/2015 and prior chest radiographs  FINDINGS: Cardiovascular: Cardiomegaly  and heavy multivessel coronary artery calcifications noted. Thoracic aortic atherosclerotic calcifications are identified. There are lateral and anterior atherosclerotic calcifications involving the very proximal ascending thoracic aorta. Medial and lateral atherosclerotic calcifications involving the mid and distal aspects of the ascending thoracic aorta noted. There is no evidence of thoracic aortic aneurysm or pericardial effusion.  Mediastinum/Nodes: No mediastinal mass or enlarged lymph nodes identified. Several sub cm thyroid nodules are identified.  Lungs/Pleura: Bibasilar atelectasis identified. Mild interstitial prominence may represent mild interstitial edema. A trace left pleural effusion is noted. No airspace disease, pulmonary mass or consolidation identified. Small noncalcified right lower lobe nodules are identified, the largest measuring 4 mm (image 82).  Upper Abdomen: No acute abnormality  Musculoskeletal: No acute or suspicious abnormality.  IMPRESSION: Lateral and anterior atherosclerotic calcifications involving the very proximal ascending thoracic aorta. No evidence of thoracic aortic aneurysm.  Cardiomegaly and coronary artery disease.  Bibasilar atelectasis, trace left pleural effusion and possible mild interstitial pulmonary edema.  Right lower lobe nodules, largest measuring 4 mm. No follow-up needed if patient is low-risk (and has no known or suspected primary neoplasm). Non-contrast chest CT can be considered in 12 months if patient is high-risk. This recommendation follows the consensus statement: Guidelines for Management of Incidental Pulmonary Nodules Detected on CT Images: From the Fleischner Society 2017; Radiology 2017; 284:228-243.   Electronically Signed   By: Harmon PierJeffrey  Hu M.D.   On: 11/27/2015 08:18  Assessment/Plan:  Left main and severe multi-vessel coronary artery  disease. I have reviewed the CT scan and her ascending  aorta is very calcified and this would preclude cross-clamping. Her heart is so large that an off-pump LIMA to the LAD may not be possible due to hemodynamic compromise while retracting the heart to expose the LAD. Her LCX and distal RCA certainly could not be done off-pump. Sometimes there is an area that is soft enough to get an aortic cannula into to do on-pump beating CABG but there is no way to tell without feeling the aorta and given the amount of calcification of her aorta and coronaries the odds are against that. She is 82 with stage IV CKD so I would err on the side of caution and I could not recommend CABG. I discussed that with her but I don't know if she understands exactly what I am saying. Her daughter is not here at the present time.   LOS: 5 days    Alleen Borne 11/27/2015

## 2015-11-27 NOTE — Progress Notes (Signed)
PM Interventional Team Note:   Case discussed with Dr. Laneta SimmersBartle. 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 I have reviewed her images with the interventional team. PCI of the Circumflex and RCA is possible. This will require rotablator atherectomy. I have discussed this with the patient and her family. Her options include palliative care vs PCI. She and her family will discuss the options this weekend.  If she chooses PCI, will plan PCI of the Circumflex and RCA on Tuesday with Dr. Excell Seltzerooper. Will load with Plavix 600 mg po x today and start Plavix 75 mg po daily tomorrow.   Verne CarrowChristopher McAlhany 11/27/2015 6:06 PM

## 2015-11-27 NOTE — Progress Notes (Signed)
ANTICOAGULATION CONSULT NOTE  Pharmacy Consult for Heparin Indication: chest pain/ACS and atrial fibrillation  No Known Allergies  Patient Measurements: Height: 5\' 6"  (167.6 cm) Weight: 153 lb 4.8 oz (69.5 kg) IBW/kg (Calculated) : 59.3 HEPARIN DW (KG): 69.5   Vital Signs: Temp: 98.1 F (36.7 C) (10/20 1100) Temp Source: Oral (10/20 1100) BP: 132/69 (10/20 1100) Pulse Rate: 91 (10/20 1100)  Labs:  Recent Labs  11/25/15 0417 11/26/15 0343 11/27/15 0252 11/27/15 0300 11/27/15 1130  HGB 10.0* 9.7*  --  9.2*  --   HCT 29.8* 29.0*  --  27.6*  --   PLT 264 259  --  232  --   LABPROT 20.9* 18.2*  --  17.3*  --   INR 1.77 1.49  --  1.40  --   HEPARINUNFRC 0.53 0.47 0.20*  --  0.46  CREATININE 1.93* 1.78*  --  1.60*  --    Estimated Creatinine Clearance: 25.4 mL/min (by C-G formula based on SCr of 1.6 mg/dL (H)).  Assessment: 82 y.o. female with chest pain and h/o Afib. S/p cardiac cath 10/19. She is on warfarin PTA for AFib (5 mg by mouth daily except 2.5 mg by mouth on Wednesdays and Sundays). Current INR 1.4. Heparin level therapeutic x1 (0.46) after gtt increase to 1000 units/Hr. CBC stable, no bleeding noted.   Goal of Therapy:  Heparin level 0.3-0.7 units/ml Monitor platelets by anticoagulation protocol: Yes   Plan:  Continue heparin at 1000 units/hr  Will f/u 8hr heparin level, confirmatory Monitor s/sx bleeding, CBC, heparin level daily  Allena Katzaroline E Norene Oliveri, Pharm.D. PGY1 Pharmacy Resident 10/20/20171:33 PM Pager [phone]

## 2015-11-28 DIAGNOSIS — N184 Chronic kidney disease, stage 4 (severe): Secondary | ICD-10-CM

## 2015-11-28 DIAGNOSIS — I481 Persistent atrial fibrillation: Secondary | ICD-10-CM

## 2015-11-28 DIAGNOSIS — I200 Unstable angina: Secondary | ICD-10-CM

## 2015-11-28 LAB — CBC
HEMATOCRIT: 26.9 % — AB (ref 36.0–46.0)
HEMOGLOBIN: 9.1 g/dL — AB (ref 12.0–15.0)
MCH: 32 pg (ref 26.0–34.0)
MCHC: 33.8 g/dL (ref 30.0–36.0)
MCV: 94.7 fL (ref 78.0–100.0)
Platelets: 234 10*3/uL (ref 150–400)
RBC: 2.84 MIL/uL — AB (ref 3.87–5.11)
RDW: 13.8 % (ref 11.5–15.5)
WBC: 8.9 10*3/uL (ref 4.0–10.5)

## 2015-11-28 LAB — GLUCOSE, CAPILLARY
GLUCOSE-CAPILLARY: 123 mg/dL — AB (ref 65–99)
GLUCOSE-CAPILLARY: 135 mg/dL — AB (ref 65–99)
GLUCOSE-CAPILLARY: 170 mg/dL — AB (ref 65–99)
Glucose-Capillary: 110 mg/dL — ABNORMAL HIGH (ref 65–99)
Glucose-Capillary: 187 mg/dL — ABNORMAL HIGH (ref 65–99)
Glucose-Capillary: 181 mg/dL — ABNORMAL HIGH (ref 65–99)
Glucose-Capillary: 129 mg/dL — ABNORMAL HIGH (ref 65–99)

## 2015-11-28 LAB — BASIC METABOLIC PANEL
ANION GAP: 7 (ref 5–15)
BUN: 28 mg/dL — ABNORMAL HIGH (ref 6–20)
CHLORIDE: 105 mmol/L (ref 101–111)
CO2: 25 mmol/L (ref 22–32)
Calcium: 8.9 mg/dL (ref 8.9–10.3)
Creatinine, Ser: 1.6 mg/dL — ABNORMAL HIGH (ref 0.44–1.00)
GFR calc Af Amer: 33 mL/min — ABNORMAL LOW (ref >60)
GFR calc non Af Amer: 29 mL/min — ABNORMAL LOW (ref >60)
GLUCOSE: 125 mg/dL — AB (ref 65–99)
POTASSIUM: 4 mmol/L (ref 3.5–5.1)
Sodium: 137 mmol/L (ref 135–145)

## 2015-11-28 LAB — PROTIME-INR
INR: 1.21
Prothrombin Time: 15.3 seconds — ABNORMAL HIGH (ref 11.4–15.2)

## 2015-11-28 LAB — HEPARIN LEVEL (UNFRACTIONATED): Heparin Unfractionated: 0.36 IU/mL (ref 0.30–0.70)

## 2015-11-28 NOTE — Progress Notes (Signed)
ANTICOAGULATION CONSULT NOTE - Follow Up Consult  Pharmacy Consult for Heparin Indication: chest pain/ACS and atrial fibrillation  No Known Allergies  Patient Measurements: Height: 5\' 6"  (167.6 cm) Weight: 153 lb 4.8 oz (69.5 kg) IBW/kg (Calculated) : 59.3 Heparin Dosing Weight: 69.5 kg  Vital Signs: Temp: 98.2 F (36.8 C) (10/21 0759) Temp Source: Oral (10/21 0759) BP: 150/68 (10/21 0759) Pulse Rate: 52 (10/21 0759)  Labs:  Recent Labs  11/26/15 0343  11/27/15 0300 11/27/15 1130 11/27/15 1846 11/28/15 0314  HGB 9.7*  --  9.2*  --   --  9.1*  HCT 29.0*  --  27.6*  --   --  26.9*  PLT 259  --  232  --   --  234  LABPROT 18.2*  --  17.3*  --   --  15.3*  INR 1.49  --  1.40  --   --  1.21  HEPARINUNFRC 0.47  < >  --  0.46 0.38 0.36  CREATININE 1.78*  --  1.60*  --   --  1.60*  < > = values in this interval not displayed.  Estimated Creatinine Clearance: 25.4 mL/min (by C-G formula based on SCr of 1.6 mg/dL (H)).   Assessment:  [age] y.o. female with chest pain/CAD and h/o Afib. S/p cardiac cath 10/19.  She is on warfarin PTA for AFib (5 mg by mouth daily except 2.5 mg by mouth on Wednesdays and Sundays).  Warfarin is on hold for procedures.    Heparin level remains therapeutic (0.36) on 1000 units/hr, but trending down some.   INR down to 1.21.   Not a candidate for CABG. Plavix added 10/20. Considering PCI on 10/24.       Goal of Therapy:  Heparin level 0.3-0.7 units/ml Monitor platelets by anticoagulation protocol: Yes   Plan:   Increase heparin drip to 1050 units/hr, to try to keep level at goal.  Next heparin level and CBC in am.  Dennie FettersEgan, Kim Oki Donovan, RPh Pager: [phone] 11/28/2015,10:28 AM

## 2015-11-28 NOTE — Progress Notes (Signed)
Patient Name: Melissa [name] Date of Encounter: 11/28/2015  Primary Cardiologist: Dr. Leroy LibmanMcDowell  Hospital Problem List     Active Problems:   Unstable angina [facility](HCC)   Chronic kidney disease (CKD), stage IV (severe) (HCC)   NSTEMI (non-ST elevated myocardial infarction) (HCC)   Pulmonary hypertension     Subjective   No chest pain.  Agrees to go forward with the cath/PCI  Inpatient Medications    Scheduled Meds: . aspirin EC  81 mg Oral Daily  . atorvastatin  40 mg Oral Daily  . clopidogrel  75 mg Oral Daily  . darifenacin  7.5 mg Oral Daily  . hydrALAZINE  100 mg Oral TID  . insulin aspart  0-9 Units Subcutaneous Q4H  . isosorbide mononitrate  60 mg Oral Daily  . latanoprost  1 drop Both Eyes QHS  . lisinopril  40 mg Oral Daily  . mouth rinse  15 mL Mouth Rinse BID  . metoprolol tartrate  25 mg Oral BID  . pantoprazole  40 mg Oral Q0600  . sodium chloride flush  3 mL Intravenous Q12H  . sodium chloride flush  3 mL Intravenous Q12H   Continuous Infusions: . heparin 1,050 Units/hr (11/28/15 1100)  . nitroGLYCERIN 10 mcg/min (11/28/15 1100)   PRN Meds: sodium chloride, acetaminophen, ALPRAZolam, morphine injection, nitroGLYCERIN, ondansetron **OR** ondansetron (ZOFRAN) IV, sodium chloride flush   Vital Signs    Vitals:   11/28/15 0344 11/28/15 0400 11/28/15 0500 11/28/15 0759  BP: 134/60 123/61 130/60 (!) 150/68  Pulse: (!) 56 (!) 56 (!) 55 (!) 52  Resp: 10 13 11 14   Temp: 98.1 F (36.7 C)   98.2 F (36.8 C)  TempSrc: Oral   Oral  SpO2: 98% 98% 100% 100%  Weight:      Height:        Intake/Output Summary (Last 24 hours) at 11/28/15 1159 Last data filed at 11/28/15 1100  Gross per 24 hour  Intake           982.21 ml  Output              450 ml  Net           532.21 ml   Filed Weights   11/24/15 0448 11/25/15 0440 11/26/15 0537  Weight: 69.5 kg (153 lb 3.2 oz) 69.5 kg (153 lb 3.2 oz) 69.5 kg (153 lb 4.8 oz)    Physical Exam   GEN: Well  nourished, well developed, in no acute distress.  HEENT: Grossly normal.  Neck: Supple, no JVD, carotid bruits, or masses. Cardiac: RRR, no murmurs, rubs, or gallops. No clubbing, cyanosis, edema.  Radials/DP/PT 2+ and equal bilaterally.  Respiratory:  Respirations regular and unlabored, clear to auscultation bilaterally. GI: Soft, nontender, nondistended, BS + x 4. MS: no deformity or atrophy. Skin: warm and dry, no rash. Neuro:  Strength and sensation are intact. Psych: AAOx3.  Normal affect.  Labs    CBC  Recent Labs  11/27/15 0300 11/28/15 0314  WBC 7.4 8.9  HGB 9.2* 9.1*  HCT 27.6* 26.9*  MCV 94.5 94.7  PLT 232 234   Basic Metabolic Panel  Recent Labs  11/27/15 0300 11/28/15 0314  NA 135 137  K 4.0 4.0  CL 104 105  CO2 24 25  GLUCOSE 85 125*  BUN 26* 28*  CREATININE 1.60* 1.60*  CALCIUM 8.8* 8.9   Liver Function Tests No results for input(s): AST, ALT, ALKPHOS, BILITOT, PROT, ALBUMIN in the last 72 hours.  No results for input(s): LIPASE, AMYLASE in the last 72 hours. Cardiac Enzymes No results for input(s): CKTOTAL, CKMB, CKMBINDEX, TROPONINI in the last 72 hours. BNP Invalid input(s): POCBNP D-Dimer No results for input(s): DDIMER in the last 72 hours. Hemoglobin A1C No results for input(s): HGBA1C in the last 72 hours. Fasting Lipid Panel No results for input(s): CHOL, HDL, LDLCALC, TRIG, CHOLHDL, LDLDIRECT in the last 72 hours. Thyroid Function Tests No results for input(s): TSH, T4TOTAL, T3FREE, THYROIDAB in the last 72 hours.  Invalid input(s): FREET3  Telemetry    AFib- Personally Reviewed  ECG     AFib, T wave changes- Personally Reviewed  Radiology    Ct Chest Wo Contrast  Result Date: 11/27/2015 CLINICAL DATA:  82 year old female with shortness of breath and chest pain. Evaluate proximal thoracic aortic calcifications prior to possible CABG. EXAM: CT CHEST WITHOUT CONTRAST TECHNIQUE: Multidetector CT imaging of the chest was  performed following the standard protocol without IV contrast. COMPARISON:  11/22/2015 and prior chest radiographs FINDINGS: Cardiovascular: Cardiomegaly and heavy multivessel coronary artery calcifications noted. Thoracic aortic atherosclerotic calcifications are identified. There are lateral and anterior atherosclerotic calcifications involving the very proximal ascending thoracic aorta. Medial and lateral atherosclerotic calcifications involving the mid and distal aspects of the ascending thoracic aorta noted. There is no evidence of thoracic aortic aneurysm or pericardial effusion. Mediastinum/Nodes: No mediastinal mass or enlarged lymph nodes identified. Several sub cm thyroid nodules are identified. Lungs/Pleura: Bibasilar atelectasis identified. Mild interstitial prominence may represent mild interstitial edema. A trace left pleural effusion is noted. No airspace disease, pulmonary mass or consolidation identified. Small noncalcified right lower lobe nodules are identified, the largest measuring 4 mm (image 82). Upper Abdomen: No acute abnormality Musculoskeletal: No acute or suspicious abnormality. IMPRESSION: Lateral and anterior atherosclerotic calcifications involving the very proximal ascending thoracic aorta. No evidence of thoracic aortic aneurysm. Cardiomegaly and coronary artery disease. Bibasilar atelectasis, trace left pleural effusion and possible mild interstitial pulmonary edema. Right lower lobe nodules, largest measuring 4 mm. No follow-up needed if patient is low-risk (and has no known or suspected primary neoplasm). Non-contrast chest CT can be considered in 12 months if patient is high-risk. This recommendation follows the consensus statement: Guidelines for Management of Incidental Pulmonary Nodules Detected on CT Images: From the Fleischner Society 2017; Radiology 2017; 284:228-243. Electronically Signed   By: Harmon Pier M.D.   On: 11/27/2015 08:18    Cardiac Studies   Echo: LVEF  60-65%; I personally reviewed the cath films  Patient Profile     82 y/o with multivessel CAD  Assessment & Plan    1) Unstable angina: Plan for PCI of RCA and circ.  Hold coumadin.  Loaded with plavix.  Continue heparin until procedure which will require rotational atherectomy.  2) AFib: rate controlled.  Heparin for stroke prevention.  Signed, Lance Muss, MD  11/28/2015, 11:59 AM

## 2015-11-29 LAB — GLUCOSE, CAPILLARY
GLUCOSE-CAPILLARY: 109 mg/dL — AB (ref 65–99)
GLUCOSE-CAPILLARY: 130 mg/dL — AB (ref 65–99)
GLUCOSE-CAPILLARY: 190 mg/dL — AB (ref 65–99)
GLUCOSE-CAPILLARY: 117 mg/dL — AB (ref 65–99)
Glucose-Capillary: 155 mg/dL — ABNORMAL HIGH (ref 65–99)
Glucose-Capillary: 159 mg/dL — ABNORMAL HIGH (ref 65–99)

## 2015-11-29 LAB — CBC
HCT: 27.1 % — ABNORMAL LOW (ref 36.0–46.0)
Hemoglobin: 9 g/dL — ABNORMAL LOW (ref 12.0–15.0)
MCH: 31.6 pg (ref 26.0–34.0)
MCHC: 33.2 g/dL (ref 30.0–36.0)
MCV: 95.1 fL (ref 78.0–100.0)
PLATELETS: 230 10*3/uL (ref 150–400)
RBC: 2.85 MIL/uL — AB (ref 3.87–5.11)
RDW: 13.9 % (ref 11.5–15.5)
WBC: 8.3 10*3/uL (ref 4.0–10.5)

## 2015-11-29 LAB — BASIC METABOLIC PANEL
ANION GAP: 7 (ref 5–15)
BUN: 31 mg/dL — AB (ref 6–20)
CALCIUM: 8.8 mg/dL — AB (ref 8.9–10.3)
CO2: 23 mmol/L (ref 22–32)
Chloride: 107 mmol/L (ref 101–111)
Creatinine, Ser: 1.72 mg/dL — ABNORMAL HIGH (ref 0.44–1.00)
GFR calc Af Amer: 31 mL/min — ABNORMAL LOW (ref >60)
GFR, EST NON AFRICAN AMERICAN: 26 mL/min — AB (ref >60)
GLUCOSE: 130 mg/dL — AB (ref 65–99)
POTASSIUM: 4 mmol/L (ref 3.5–5.1)
SODIUM: 137 mmol/L (ref 135–145)

## 2015-11-29 LAB — PROTIME-INR
INR: 1.17
Prothrombin Time: 14.9 seconds (ref 11.4–15.2)

## 2015-11-29 LAB — HEPARIN LEVEL (UNFRACTIONATED): HEPARIN UNFRACTIONATED: 0.54 [IU]/mL (ref 0.30–0.70)

## 2015-11-29 MED ORDER — ATROPINE SULFATE 1 MG/10ML IJ SOSY
PREFILLED_SYRINGE | INTRAMUSCULAR | Status: AC
  Filled 2015-11-29: qty 10

## 2015-11-29 NOTE — Progress Notes (Signed)
Md notified pt c/o dizziness hr 40-50 bp 112/64.  Will continue to monitor Melissa AddisonCoro, Luisdavid Hamblin T

## 2015-11-29 NOTE — Progress Notes (Signed)
ANTICOAGULATION CONSULT NOTE - Follow Up Consult  Pharmacy Consult for Heparin Indication: chest pain/ACS and atrial fibrillation  No Known Allergies  Patient Measurements: Height: 5\' 6"  (167.6 cm) Weight: 153 lb 4.8 oz (69.5 kg) IBW/kg (Calculated) : 59.3 Heparin Dosing Weight: 69.5 kg  Vital Signs: Temp: 98.4 F (36.9 C) (10/22 0754) Temp Source: Oral (10/22 0754) BP: 135/66 (10/22 0700) Pulse Rate: 53 (10/22 0700)  Labs:  Recent Labs  11/27/15 0300  11/27/15 1846 11/28/15 0314 11/29/15 0222  HGB 9.2*  --   --  9.1* 9.0*  HCT 27.6*  --   --  26.9* 27.1*  PLT 232  --   --  234 230  LABPROT 17.3*  --   --  15.3* 14.9  INR 1.40  --   --  1.21 1.17  HEPARINUNFRC  --   < > 0.38 0.36 0.54  CREATININE 1.60*  --   --  1.60* 1.72*  < > = values in this interval not displayed.  Estimated Creatinine Clearance: 23.6 mL/min (by C-G formula based on SCr of 1.72 mg/dL (H)).   Assessment:  82 y.o. female with chest pain/CAD and h/o Afib. S/p cardiac cath 10/19.  She is on warfarin PTA for AFib (5 mg by mouth daily except 2.5 mg by mouth on Wednesdays and Sundays).  Warfarin is on hold for procedures.    Heparin level is therapeutic (0.54) on 1050 units/hr.   INR down to 1.17   Not a candidate for CABG. Plavix added 10/20. Considering PCI on 10/24.      Goal of Therapy:  Heparin level 0.3-0.7 units/ml Monitor platelets by anticoagulation protocol: Yes   Plan:   Continue heparin drip at 1050 units/hr.  Next heparin level and CBC in am.  Dennie FettersEgan, Elazar Argabright Donovan, RPh Pager: [phone] 11/29/2015,8:37 AM

## 2015-11-29 NOTE — Progress Notes (Signed)
Patient Name: Melissa [name] Date of Encounter: 11/29/2015  Primary Cardiologist: Dr. Leroy LibmanMcDowell  Hospital Problem List     Active Problems:   Unstable angina [facility](HCC)   Chronic kidney disease (CKD), stage IV (severe) (HCC)   NSTEMI (non-ST elevated myocardial infarction) (HCC)   Pulmonary hypertension     Subjective   No chest pain.  Agrees to go forward with the cath/PCI.  Planned for Tuesday.    Inpatient Medications    Scheduled Meds: . aspirin EC  81 mg Oral Daily  . atorvastatin  40 mg Oral Daily  . clopidogrel  75 mg Oral Daily  . darifenacin  7.5 mg Oral Daily  . hydrALAZINE  100 mg Oral TID  . insulin aspart  0-9 Units Subcutaneous Q4H  . isosorbide mononitrate  60 mg Oral Daily  . latanoprost  1 drop Both Eyes QHS  . lisinopril  40 mg Oral Daily  . mouth rinse  15 mL Mouth Rinse BID  . metoprolol tartrate  25 mg Oral BID  . pantoprazole  40 mg Oral Q0600  . sodium chloride flush  3 mL Intravenous Q12H  . sodium chloride flush  3 mL Intravenous Q12H   Continuous Infusions: . heparin 1,050 Units/hr (11/28/15 2213)  . nitroGLYCERIN Stopped (11/29/15 0300)   PRN Meds: sodium chloride, acetaminophen, ALPRAZolam, morphine injection, nitroGLYCERIN, ondansetron **OR** ondansetron (ZOFRAN) IV, sodium chloride flush   Vital Signs    Vitals:   11/29/15 0300 11/29/15 0406 11/29/15 0700 11/29/15 0754  BP: 127/63 (!) 113/53 135/66   Pulse: (!) 48 (!) 45 (!) 53   Resp: 18 18 18    Temp:  98.1 F (36.7 C)  98.4 F (36.9 C)  TempSrc:  Oral  Oral  SpO2: 100% 100% 100%   Weight:      Height:        Intake/Output Summary (Last 24 hours) at 11/29/15 1009 Last data filed at 11/29/15 0600  Gross per 24 hour  Intake           860.71 ml  Output              625 ml  Net           235.71 ml   Filed Weights   11/24/15 0448 11/25/15 0440 11/26/15 0537  Weight: 69.5 kg (153 lb 3.2 oz) 69.5 kg (153 lb 3.2 oz) 69.5 kg (153 lb 4.8 oz)    Physical Exam   GEN: Well  nourished, well developed, in no acute distress.  HEENT: Grossly normal.  Neck: Supple, no JVD, carotid bruits, or masses. Cardiac: RRR, no murmurs, rubs, or gallops. No clubbing, cyanosis, edema.  Radials/DP/PT 2+ and equal bilaterally.  Respiratory:  Respirations regular and unlabored, clear to auscultation bilaterally. GI: Soft, nontender, nondistended, BS + x 4. MS: no deformity or atrophy. Skin: warm and dry, no rash. Neuro:  Strength and sensation are intact. Psych: AAOx3.  Normal affect.  Labs    CBC  Recent Labs  11/28/15 0314 11/29/15 0222  WBC 8.9 8.3  HGB 9.1* 9.0*  HCT 26.9* 27.1*  MCV 94.7 95.1  PLT 234 230   Basic Metabolic Panel  Recent Labs  11/28/15 0314 11/29/15 0222  NA 137 137  K 4.0 4.0  CL 105 107  CO2 25 23  GLUCOSE 125* 130*  BUN 28* 31*  CREATININE 1.60* 1.72*  CALCIUM 8.9 8.8*   Liver Function Tests No results for input(s): AST, ALT, ALKPHOS, BILITOT, PROT, ALBUMIN in  the last 72 hours. No results for input(s): LIPASE, AMYLASE in the last 72 hours. Cardiac Enzymes No results for input(s): CKTOTAL, CKMB, CKMBINDEX, TROPONINI in the last 72 hours. BNP Invalid input(s): POCBNP D-Dimer No results for input(s): DDIMER in the last 72 hours. Hemoglobin A1C No results for input(s): HGBA1C in the last 72 hours. Fasting Lipid Panel No results for input(s): CHOL, HDL, LDLCALC, TRIG, CHOLHDL, LDLDIRECT in the last 72 hours. Thyroid Function Tests No results for input(s): TSH, T4TOTAL, T3FREE, THYROIDAB in the last 72 hours.  Invalid input(s): FREET3  Telemetry    AFib- Personally Reviewed  ECG     AFib, T wave changes- Personally Reviewed  Radiology    No results found.  Cardiac Studies   Echo: LVEF 60-65%; I personally reviewed the cath films  Patient Profile     82 y/o with multivessel CAD  Assessment & Plan    1) Unstable angina: Plan for PCI of RCA and circ.  Hold coumadin.  Loaded with plavix.  Continue heparin until  procedure which will require rotational atherectomy.  Cr increased to 1.73 tody.  Recheck tomorrow.  LVEDP was normal at the time of cath.  Could give some gentle hydration pre cath. Recheck BMet in AM.  2) AFib: rate controlled.  Heparin for stroke prevention and treatment for ACS.  3) Anemia: stable.  Signed, Lance Muss, MD  11/29/2015, 10:09 AM

## 2015-11-30 DIAGNOSIS — I214 Non-ST elevation (NSTEMI) myocardial infarction: Secondary | ICD-10-CM

## 2015-11-30 LAB — BASIC METABOLIC PANEL
ANION GAP: 8 (ref 5–15)
BUN: 32 mg/dL — ABNORMAL HIGH (ref 6–20)
CALCIUM: 9 mg/dL (ref 8.9–10.3)
CO2: 24 mmol/L (ref 22–32)
Chloride: 105 mmol/L (ref 101–111)
Creatinine, Ser: 1.79 mg/dL — ABNORMAL HIGH (ref 0.44–1.00)
GFR, EST AFRICAN AMERICAN: 29 mL/min — AB (ref >60)
GFR, EST NON AFRICAN AMERICAN: 25 mL/min — AB (ref >60)
Glucose, Bld: 117 mg/dL — ABNORMAL HIGH (ref 65–99)
POTASSIUM: 4.1 mmol/L (ref 3.5–5.1)
SODIUM: 137 mmol/L (ref 135–145)

## 2015-11-30 LAB — GLUCOSE, CAPILLARY
GLUCOSE-CAPILLARY: 132 mg/dL — AB (ref 65–99)
GLUCOSE-CAPILLARY: 177 mg/dL — AB (ref 65–99)
GLUCOSE-CAPILLARY: 179 mg/dL — AB (ref 65–99)
GLUCOSE-CAPILLARY: 274 mg/dL — AB (ref 65–99)
Glucose-Capillary: 135 mg/dL — ABNORMAL HIGH (ref 65–99)

## 2015-11-30 LAB — CBC
HCT: 26.9 % — ABNORMAL LOW (ref 36.0–46.0)
Hemoglobin: 9 g/dL — ABNORMAL LOW (ref 12.0–15.0)
MCH: 32.4 pg (ref 26.0–34.0)
MCHC: 33.5 g/dL (ref 30.0–36.0)
MCV: 96.8 fL (ref 78.0–100.0)
PLATELETS: 224 10*3/uL (ref 150–400)
RBC: 2.78 MIL/uL — AB (ref 3.87–5.11)
RDW: 14 % (ref 11.5–15.5)
WBC: 7.7 10*3/uL (ref 4.0–10.5)

## 2015-11-30 LAB — HEPARIN LEVEL (UNFRACTIONATED): HEPARIN UNFRACTIONATED: 0.52 [IU]/mL (ref 0.30–0.70)

## 2015-11-30 LAB — PROTIME-INR
INR: 1.18
Prothrombin Time: 15.1 seconds (ref 11.4–15.2)

## 2015-11-30 MED ORDER — SODIUM CHLORIDE 0.9 % IV SOLN: 250.0000 mL | INTRAVENOUS | Status: DC | PRN

## 2015-11-30 MED ORDER — ASPIRIN 81 MG PO CHEW
81.0000 mg | CHEWABLE_TABLET | ORAL | Status: AC
  Administered 2015-12-01: 81 mg via ORAL
  Filled 2015-11-30: qty 1

## 2015-11-30 MED ORDER — CLOPIDOGREL BISULFATE 75 MG PO TABS
75.0000 mg | ORAL_TABLET | ORAL | Status: AC
  Administered 2015-12-01: 75 mg via ORAL
  Filled 2015-11-30: qty 1

## 2015-11-30 MED ORDER — SODIUM CHLORIDE 0.9 % WEIGHT BASED INFUSION
1.0000 mL/kg/h | INTRAVENOUS | Status: DC
  Administered 2015-11-30 (×2): 1 mL/kg/h via INTRAVENOUS

## 2015-11-30 MED ORDER — SODIUM CHLORIDE 0.9% FLUSH: 3.0000 mL | INTRAVENOUS | Status: DC | PRN

## 2015-11-30 MED ORDER — SODIUM CHLORIDE 0.9% FLUSH: 3.0000 mL | Freq: Two times a day (BID) | INTRAVENOUS | Status: DC

## 2015-11-30 NOTE — Progress Notes (Addendum)
Patient Name: Melissa [name] Date of Encounter: 11/30/2015  Primary Cardiologist: Nona Dell, M.D.  Hospital Problem List     Active Problems:   Unstable angina (HCC)   Chronic kidney disease (CKD), stage IV (severe) (HCC)   NSTEMI (non-ST elevated myocardial infarction) (HCC)   Pulmonary hypertension     Subjective   Reasonable night's sleep. Rare recurring episodes of pain over the weekend. No current discomfort. Complex three-vessel coronary disease with totally occluded LAD and high-grade proximal circumflex and right coronary lesions with significant distal vessel disease. Turned down for surgery due to comorbidities. Plan now is for high risk PCI of proximal circumflex and RCA. Patient is aware.  Inpatient Medications    Scheduled Meds: . aspirin EC  81 mg Oral Daily  . atorvastatin  40 mg Oral Daily  . atropine      . clopidogrel  75 mg Oral Daily  . darifenacin  7.5 mg Oral Daily  . hydrALAZINE  100 mg Oral TID  . insulin aspart  0-9 Units Subcutaneous Q4H  . isosorbide mononitrate  60 mg Oral Daily  . latanoprost  1 drop Both Eyes QHS  . lisinopril  40 mg Oral Daily  . mouth rinse  15 mL Mouth Rinse BID  . metoprolol tartrate  25 mg Oral BID  . pantoprazole  40 mg Oral Q0600  . sodium chloride flush  3 mL Intravenous Q12H  . sodium chloride flush  3 mL Intravenous Q12H   Continuous Infusions: . heparin 1,050 Units/hr (11/30/15 0203)  . nitroGLYCERIN Stopped (11/29/15 0300)   PRN Meds: sodium chloride, acetaminophen, ALPRAZolam, morphine injection, nitroGLYCERIN, ondansetron **OR** ondansetron (ZOFRAN) IV, sodium chloride flush   Vital Signs    Vitals:   11/29/15 2324 11/30/15 0203 11/30/15 0327 11/30/15 0815  BP: 124/72 (!) 166/83 138/84 (!) 154/77  Pulse: (!) 48  (!) 55 66  Resp: 17  11 16   Temp: 97.8 F (36.6 C)   97.4 F (36.3 C)  TempSrc: Oral  Oral Axillary  SpO2: 100%  100% 100%  Weight:   157 lb 3 oz (71.3 kg)   Height:         Intake/Output Summary (Last 24 hours) at 11/30/15 0856 Last data filed at 11/30/15 0700  Gross per 24 hour  Intake           421.51 ml  Output              925 ml  Net          -503.49 ml   Filed Weights   11/25/15 0440 11/26/15 0537 11/30/15 0327  Weight: 153 lb 3.2 oz (69.5 kg) 153 lb 4.8 oz (69.5 kg) 157 lb 3 oz (71.3 kg)    Physical Exam    GEN: Elderly African-American female Well nourished, well developed, in no acute distress.  HEENT: Grossly normal.  Neck: Supple, no JVD, carotid bruits, or masses. Cardiac: RRR, no murmurs, rubs, or gallops. No clubbing, cyanosis, edema.  Radials/DP/PT 2+ and equal bilaterally.  Respiratory:  Respirations regular and unlabored, clear to auscultation bilaterally. GI: Soft, nontender, nondistended, BS + x 4. MS: no deformity or atrophy. Skin: warm and dry, no rash. Neuro:  Strength and sensation are intact. Psych: AAOx3.  Normal affect.  Labs    CBC  Recent Labs  11/29/15 0222 11/30/15 0243  WBC 8.3 7.7  HGB 9.0* 9.0*  HCT 27.1* 26.9*  MCV 95.1 96.8  PLT 230 224   Basic Metabolic Panel  Recent Labs  11/29/15 0222 11/30/15 0243  NA 137 137  K 4.0 4.1  CL 107 105  CO2 23 24  GLUCOSE 130* 117*  BUN 31* 32*  CREATININE 1.72* 1.79*  CALCIUM 8.8* 9.0   Liver Function Tests No results for input(s): AST, ALT, ALKPHOS, BILITOT, PROT, ALBUMIN in the last 72 hours. No results for input(s): LIPASE, AMYLASE in the last 72 hours. Cardiac Enzymes No results for input(s): CKTOTAL, CKMB, CKMBINDEX, TROPONINI in the last 72 hours. BNP Invalid input(s): POCBNP D-Dimer No results for input(s): DDIMER in the last 72 hours. Hemoglobin A1C No results for input(s): HGBA1C in the last 72 hours. Fasting Lipid Panel No results for input(s): CHOL, HDL, LDLCALC, TRIG, CHOLHDL, LDLDIRECT in the last 72 hours. Thyroid Function Tests No results for input(s): TSH, T4TOTAL, T3FREE, THYROIDAB in the last 72 hours.  Invalid input(s):  FREET3  Telemetry    Atrial fibrillation with controlled rate.- Personally Reviewed  ECG    Performed on 11/27/15 revealed atrial fibrillation, controlled rate, and marked T-wave inversions lateral and precordial leads. - Personally Reviewed  Radiology    No results found.  Cardiac Studies   Echocardiogram 11/11/15: Study Conclusions  - Left ventricle: The cavity size was normal. Wall thickness was   increased in a pattern of mild LVH. Systolic function was normal.   The estimated ejection fraction was in the range of 60% to 65%.   There is akinesis of the mid-apicalanteroseptal and apical   myocardium. The study is not technically sufficient to allow   evaluation of LV diastolic function. - Aortic valve: Mildly calcified annulus. Trileaflet; mildly   thickened leaflets. - Mitral valve: Calcified annulus. There was trivial regurgitation. - Left atrium: The atrium was severely dilated. - Right atrium: The atrium was severely dilated. Central venous   pressure (est): 8 mm Hg. - Tricuspid valve: There was moderate regurgitation. - Pulmonary arteries: Systolic pressure was severely increased. PA   peak pressure: 84 mm Hg (S). - Pericardium, extracardiac: A trivial pericardial effusion was   identified posterior to the heart.  Impressions:  - Mild LVH with LVEF 60-65%. There is akinesis of the mid to apical   anteroseptal wall and apex with associated prominent   trabeculation, no obvious LV mural thrombus. Indeterminate   diastolic function. Severe biatrial enlargement. MAC with trivial   mitral regurgitation. Mildly sclerotic aortic valve. Moderate   tricuspid regurgitation with evidence of severe pulmonary   hypertension, PASP estimated 84 mmHg. Trivial posterior   pericardial effusion.  Cardiac catheterization findings described above: Conclusion   Conclusions: 1.  Severe, heavily calcified three-vessel coronary artery disease (as described below), including  chronic total occlusion of the mid LAD, 90% proximal LCx stenosis, and 90% ostial RCA lesion with moderate diffuse disease throughout the remainder of the RCA. 2.  Normal left and right heart filling pressures. 3.  Mild to moderate pulmonary hypertension. 4.  Normal cardiac output/index. 5.  Tortuous and calcified thoracic and abdominal aorta.      Patient Profile     82 year old with unstable angina presentation found to have severe calcified complex coronary artery disease. Not a surgical candidate. High-risk PCI as planned.   Assessment & Plan    1. Severe three-vessel coronary disease as outlined above, plan is for high-risk PCI of right coronary and circumflex proximal stenoses on 12/01/15. Precath orders are written. Procedure discussed with the patient.  2. Acute on chronic CKD stage IV, plan IV hydration, prior to the procedure this  tomorrow, starting this evening. 3. Non-ST elevation MI, with preserved LV function.   Signed, Lesleigh Noe, MD  11/30/2015, 8:56 AM

## 2015-11-30 NOTE — Progress Notes (Signed)
Md notified pt's hr 47-58 and bp 128/66.  Pt sch lopressor 25 and HTZ 100.  Have been holding lopressor and last dose HTZ held.  Pt asymptomatic .  Will continue to monitor. Karena Addisonoro, Scout Guyett T

## 2015-11-30 NOTE — Progress Notes (Addendum)
ANTICOAGULATION CONSULT NOTE - Follow Up Consult  Pharmacy Consult for Heparin Indication: chest pain/ACS and atrial fibrillation  No Known Allergies  Patient Measurements: Height: 5\' 6"  (167.6 cm) Weight: 157 lb 3 oz (71.3 kg) IBW/kg (Calculated) : 59.3 Heparin Dosing Weight: 69.5 kg  Vital Signs: Temp: 97.4 F (36.3 C) (10/23 0815) Temp Source: Axillary (10/23 0815) BP: 154/77 (10/23 0815) Pulse Rate: 66 (10/23 0815)  Labs:  Recent Labs  11/28/15 0314 11/29/15 0222 11/30/15 0243  HGB 9.1* 9.0* 9.0*  HCT 26.9* 27.1* 26.9*  PLT 234 230 224  LABPROT 15.3* 14.9 15.1  INR 1.21 1.17 1.18  HEPARINUNFRC 0.36 0.54 0.52  CREATININE 1.60* 1.72* 1.79*    Estimated Creatinine Clearance: 24.5 mL/min (by C-G formula based on SCr of 1.79 mg/dL (H)).   Assessment:  82 y.o. female with chest pain/CAD and h/o Afib. S/p cardiac cath 10/19 with 3VCAD (poor CABG candidate).  She is on warfarin PTA for AFib (5 mg by mouth daily except 2.5 mg by mouth on Wednesdays and Sundays).  Warfarin is on hold for for PCI on 10/24.  Pharmacy consulted to dose heparin -Heparin level= 0.52      Goal of Therapy:  Heparin level 0.3-0.7 units/ml Monitor platelets by anticoagulation protocol: Yes   Plan:   Continue heparin drip at 1050 units/hr.  Daily Heparin level and CBC  Harland Germanndrew Colum Colt, Pharm D 11/30/2015 9:36 AM

## 2015-11-30 NOTE — Progress Notes (Signed)
[facility].  After back in bed pt c/o brief  mid  chest pain 8/10.  bp 166/83. Hr 60.   Increased o2 to 4lnc and pain had eased off.   Will continue to monitor Karena AddisonCoro, Chassity Ludke T

## 2015-12-01 ENCOUNTER — Encounter (HOSPITAL_COMMUNITY): Payer: Self-pay | Admitting: Cardiovascular Disease

## 2015-12-01 ENCOUNTER — Encounter (HOSPITAL_COMMUNITY)
Admission: EM | Disposition: A | Discharge status: Home / Self Care | Payer: Self-pay | Source: Home / Self Care | Attending: Cardiovascular Disease

## 2015-12-01 DIAGNOSIS — I2510 Atherosclerotic heart disease of native coronary artery without angina pectoris: Secondary | ICD-10-CM

## 2015-12-01 DIAGNOSIS — R001 Bradycardia, unspecified: Secondary | ICD-10-CM

## 2015-12-01 DIAGNOSIS — Z9861 Coronary angioplasty status: Secondary | ICD-10-CM

## 2015-12-01 HISTORY — PX: CARDIAC CATHETERIZATION: SHX172

## 2015-12-01 LAB — BASIC METABOLIC PANEL
ANION GAP: 6 (ref 5–15)
BUN: 33 mg/dL — AB (ref 6–20)
CHLORIDE: 107 mmol/L (ref 101–111)
CO2: 24 mmol/L (ref 22–32)
Calcium: 8.9 mg/dL (ref 8.9–10.3)
Creatinine, Ser: 1.78 mg/dL — ABNORMAL HIGH (ref 0.44–1.00)
GFR calc Af Amer: 29 mL/min — ABNORMAL LOW (ref >60)
GFR calc non Af Amer: 25 mL/min — ABNORMAL LOW (ref >60)
Glucose, Bld: 141 mg/dL — ABNORMAL HIGH (ref 65–99)
POTASSIUM: 4.4 mmol/L (ref 3.5–5.1)
SODIUM: 137 mmol/L (ref 135–145)

## 2015-12-01 LAB — CBC
HCT: 25.9 % — ABNORMAL LOW (ref 36.0–46.0)
HEMOGLOBIN: 8.5 g/dL — AB (ref 12.0–15.0)
MCH: 31.7 pg (ref 26.0–34.0)
MCHC: 32.8 g/dL (ref 30.0–36.0)
MCV: 96.6 fL (ref 78.0–100.0)
Platelets: 226 10*3/uL (ref 150–400)
RBC: 2.68 MIL/uL — AB (ref 3.87–5.11)
RDW: 14.1 % (ref 11.5–15.5)
WBC: 7.6 10*3/uL (ref 4.0–10.5)

## 2015-12-01 LAB — GLUCOSE, CAPILLARY
GLUCOSE-CAPILLARY: 123 mg/dL — AB (ref 65–99)
GLUCOSE-CAPILLARY: 157 mg/dL — AB (ref 65–99)
GLUCOSE-CAPILLARY: 166 mg/dL — AB (ref 65–99)
Glucose-Capillary: 127 mg/dL — ABNORMAL HIGH (ref 65–99)
Glucose-Capillary: 124 mg/dL — ABNORMAL HIGH (ref 65–99)
Glucose-Capillary: 138 mg/dL — ABNORMAL HIGH (ref 65–99)

## 2015-12-01 LAB — PROTIME-INR
INR: 1.22
PROTHROMBIN TIME: 15.5 s — AB (ref 11.4–15.2)

## 2015-12-01 LAB — POCT ACTIVATED CLOTTING TIME
ACTIVATED CLOTTING TIME: 241 s
ACTIVATED CLOTTING TIME: 164 s
Activated Clotting Time: 224 seconds
Activated Clotting Time: 246 seconds
Activated Clotting Time: 285 seconds

## 2015-12-01 LAB — HEPARIN LEVEL (UNFRACTIONATED): HEPARIN UNFRACTIONATED: 0.62 [IU]/mL (ref 0.30–0.70)

## 2015-12-01 SURGERY — CORONARY STENT INTERVENTION

## 2015-12-01 MED ORDER — NITROGLYCERIN 1 MG/10 ML FOR IR/CATH LAB
INTRA_ARTERIAL | Status: DC | PRN
Start: 2015-12-01 — End: 2015-12-01
  Administered 2015-12-01 (×2): 200 ug via INTRACORONARY

## 2015-12-01 MED ORDER — HEPARIN SODIUM (PORCINE) 1000 UNIT/ML IJ SOLN
INTRAMUSCULAR | Status: DC | PRN
  Administered 2015-12-01 (×2): 3000 [IU] via INTRAVENOUS
  Administered 2015-12-01: 6000 [IU] via INTRAVENOUS

## 2015-12-01 MED ORDER — NITROGLYCERIN 1 MG/10 ML FOR IR/CATH LAB
INTRA_ARTERIAL | Status: AC
  Filled 2015-12-01: qty 10

## 2015-12-01 MED ORDER — IOPAMIDOL (ISOVUE-370) INJECTION 76%
INTRAVENOUS | Status: AC
  Filled 2015-12-01: qty 125

## 2015-12-01 MED ORDER — FENTANYL CITRATE (PF) 100 MCG/2ML IJ SOLN
INTRAMUSCULAR | Status: DC | PRN
  Administered 2015-12-01: 25 ug via INTRAVENOUS

## 2015-12-01 MED ORDER — HEPARIN SODIUM (PORCINE) 1000 UNIT/ML IJ SOLN
INTRAMUSCULAR | Status: AC
  Filled 2015-12-01: qty 1

## 2015-12-01 MED ORDER — LABETALOL HCL 5 MG/ML IV SOLN
INTRAVENOUS | Status: AC
  Filled 2015-12-01: qty 4

## 2015-12-01 MED ORDER — FENTANYL CITRATE (PF) 100 MCG/2ML IJ SOLN
INTRAMUSCULAR | Status: AC
  Filled 2015-12-01: qty 2

## 2015-12-01 MED ORDER — LABETALOL HCL 5 MG/ML IV SOLN
INTRAVENOUS | Status: AC
  Administered 2015-12-01: 20 mg via INTRAVENOUS
  Filled 2015-12-01: qty 4

## 2015-12-01 MED ORDER — SODIUM CHLORIDE 0.9% FLUSH
3.0000 mL | Freq: Two times a day (BID) | INTRAVENOUS | Status: DC
  Administered 2015-12-02 – 2015-12-04 (×5): 3 mL via INTRAVENOUS

## 2015-12-01 MED ORDER — HEPARIN (PORCINE) IN NACL 2-0.9 UNIT/ML-% IJ SOLN
INTRAMUSCULAR | Status: AC
  Filled 2015-12-01: qty 1000

## 2015-12-01 MED ORDER — HEPARIN (PORCINE) IN NACL 2-0.9 UNIT/ML-% IJ SOLN
INTRAMUSCULAR | Status: DC | PRN
  Administered 2015-12-01: 1000 mL

## 2015-12-01 MED ORDER — VERAPAMIL HCL 2.5 MG/ML IV SOLN
INTRAVENOUS | Status: AC
  Filled 2015-12-01: qty 2

## 2015-12-01 MED ORDER — WARFARIN - PHARMACIST DOSING INPATIENT: Freq: Every day | Status: DC

## 2015-12-01 MED ORDER — LABETALOL HCL 5 MG/ML IV SOLN
20.0000 mg | Freq: Once | INTRAVENOUS | Status: AC
  Administered 2015-12-01: 20 mg via INTRAVENOUS

## 2015-12-01 MED ORDER — WARFARIN SODIUM 7.5 MG PO TABS
7.5000 mg | ORAL_TABLET | Freq: Once | ORAL | Status: AC
  Administered 2015-12-01: 7.5 mg via ORAL
  Filled 2015-12-01: qty 1

## 2015-12-01 MED ORDER — NITROGLYCERIN 1 MG/10 ML FOR IR/CATH LAB
INTRA_ARTERIAL | Status: AC
  Filled 2015-12-01: qty 40

## 2015-12-01 MED ORDER — SODIUM CHLORIDE 0.9% FLUSH: 3.0000 mL | INTRAVENOUS | Status: DC | PRN

## 2015-12-01 MED ORDER — MIDAZOLAM HCL 2 MG/2ML IJ SOLN
INTRAMUSCULAR | Status: AC
  Filled 2015-12-01: qty 2

## 2015-12-01 MED ORDER — HYDRALAZINE HCL 20 MG/ML IJ SOLN
10.0000 mg | INTRAMUSCULAR | Status: DC | PRN
  Administered 2015-12-01: 10 mg via INTRAVENOUS
  Filled 2015-12-01: qty 1

## 2015-12-01 MED ORDER — LIDOCAINE HCL (PF) 1 % IJ SOLN
INTRAMUSCULAR | Status: DC | PRN
  Administered 2015-12-01: 20 mL

## 2015-12-01 MED ORDER — SODIUM CHLORIDE 0.9 % IV SOLN
INTRAVENOUS | Status: AC
  Administered 2015-12-01: 14:00:00 via INTRAVENOUS

## 2015-12-01 MED ORDER — SODIUM CHLORIDE 0.9 % IV SOLN: 250.0000 mL | INTRAVENOUS | Status: DC | PRN

## 2015-12-01 MED ORDER — LIDOCAINE HCL (PF) 1 % IJ SOLN
INTRAMUSCULAR | Status: AC
  Filled 2015-12-01: qty 30

## 2015-12-01 MED ORDER — MIDAZOLAM HCL 2 MG/2ML IJ SOLN
INTRAMUSCULAR | Status: DC | PRN
  Administered 2015-12-01 (×4): 1 mg via INTRAVENOUS

## 2015-12-01 MED ORDER — IOPAMIDOL (ISOVUE-370) INJECTION 76%
INTRAVENOUS | Status: DC | PRN
  Administered 2015-12-01: 80 mL via INTRA_ARTERIAL

## 2015-12-01 MED ORDER — LABETALOL HCL 5 MG/ML IV SOLN
INTRAVENOUS | Status: DC | PRN
  Administered 2015-12-01: 20 mg via INTRAVENOUS

## 2015-12-01 SURGICAL SUPPLY — 33 items
BALLN EMERGE MR 2.5X12 (BALLOONS) ×2
BALLN MINITREK OTW 1.5X12 (BALLOONS) ×2
BALLN ~~LOC~~ EMERGE MR 3.75X8 (BALLOONS) ×2
BALLN ~~LOC~~ EUPHORA RX 3.5X6 (BALLOONS) ×2
BALLOON EMERGE MR 2.5X12 (BALLOONS) IMPLANT
BALLOON MINITREK OTW 1.5X12 (BALLOONS) IMPLANT
BALLOON ~~LOC~~ EMERGE MR 3.75X8 (BALLOONS) IMPLANT
BALLOON ~~LOC~~ EUPHORA RX 3.5X6 (BALLOONS) IMPLANT
CATH MICROGUIDE FINCRSS 150 CM (MICROCATHETER) IMPLANT
CATH ROTALINK PLUS 1.50MM (BURR) ×1 IMPLANT
CATH S G BIP PACING (SET/KITS/TRAYS/PACK) ×1 IMPLANT
CATH VISTA GUIDE  7FR XB3 (CATHETERS) ×1
CATH VISTA GUIDE 7FR XB 3.5 (CATHETERS) ×1 IMPLANT
CATH VISTA GUIDE 7FR XB3 (CATHETERS) IMPLANT
ELECT DEFIB PAD ADLT CADENCE (PAD) ×2 IMPLANT
GUIDE CATH MACH 1 7F VL3 (CATHETERS) ×1 IMPLANT
KIT ENCORE 26 ADVANTAGE (KITS) ×2 IMPLANT
KIT HEART LEFT (KITS) ×2 IMPLANT
LUBRICANT ROTAGLIDE 20CC VIAL (MISCELLANEOUS) ×1 IMPLANT
MICROGUIDE FINECROSS 150 CM (MICROCATHETER) ×2
PACK CARDIAC CATHETERIZATION (CUSTOM PROCEDURE TRAY) ×2 IMPLANT
PINNACLE LONG 7F 25CM (SHEATH) ×2
SHEATH BRITE TIP 8FR 35CM (SHEATH) ×1 IMPLANT
SHEATH INTRO PINNACLE 7F 25CM (SHEATH) IMPLANT
SHEATH PINNACLE 6F 10CM (SHEATH) ×1 IMPLANT
SHEATH PINNACLE 7F 10CM (SHEATH) ×1 IMPLANT
SHEATH PINNACLE 8F 10CM (SHEATH) ×1 IMPLANT
STENT SYNERGY DES 3.5X12 (Permanent Stent) ×1 IMPLANT
TRANSDUCER W/STOPCOCK (MISCELLANEOUS) ×2 IMPLANT
TUBING CIL FLEX 10 FLL-RA (TUBING) ×2 IMPLANT
WIRE EMERALD 3MM-J .035X150CM (WIRE) ×1 IMPLANT
WIRE EXTRA SUPPORT .009X325CM (WIRE) ×1 IMPLANT
WIRE HI TORQ WHISPER MS 300CM (WIRE) ×1 IMPLANT

## 2015-12-01 NOTE — Interval H&P Note (Signed)
Cath Lab Visit (complete for each Cath Lab visit)  Clinical Evaluation Leading to the Procedure:   ACS: Yes.    Non-ACS:    Anginal Classification: CCS IV  Anti-ischemic medical therapy: Minimal Therapy (1 class of medications)  Non-Invasive Test Results: No non-invasive testing performed  Prior CABG: No previous CABG      History and Physical Interval Note:  12/01/2015 10:17 AM  Melissa [name]  has presented today for surgery, with the diagnosis of cad  The various methods of treatment have been discussed with the patient and family. After consideration of risks, benefits and other options for treatment, the patient has consented to  Procedure(s): Coronary Stent Intervention Rotoblader (N/A) as a surgical intervention .  The patient's history has been reviewed, patient examined, no change in status, stable for surgery.  I have reviewed the patient's chart and labs.  Questions were answered to the patient's satisfaction.     Tonny Bollmanooper, [place] Hanlon

## 2015-12-01 NOTE — H&P (View-Only) (Signed)
Patient Name: Melissa [name] Date of Encounter: 11/30/2015  Primary Cardiologist: Nona Dell, M.D.  Hospital Problem List     Active Problems:   Unstable angina (HCC)   Chronic kidney disease (CKD), stage IV (severe) (HCC)   NSTEMI (non-ST elevated myocardial infarction) (HCC)   Pulmonary hypertension     Subjective   Reasonable night's sleep. Rare recurring episodes of pain over the weekend. No current discomfort. Complex three-vessel coronary disease with totally occluded LAD and high-grade proximal circumflex and right coronary lesions with significant distal vessel disease. Turned down for surgery due to comorbidities. Plan now is for high risk PCI of proximal circumflex and RCA. Patient is aware.  Inpatient Medications    Scheduled Meds: . aspirin EC  81 mg Oral Daily  . atorvastatin  40 mg Oral Daily  . atropine      . clopidogrel  75 mg Oral Daily  . darifenacin  7.5 mg Oral Daily  . hydrALAZINE  100 mg Oral TID  . insulin aspart  0-9 Units Subcutaneous Q4H  . isosorbide mononitrate  60 mg Oral Daily  . latanoprost  1 drop Both Eyes QHS  . lisinopril  40 mg Oral Daily  . mouth rinse  15 mL Mouth Rinse BID  . metoprolol tartrate  25 mg Oral BID  . pantoprazole  40 mg Oral Q0600  . sodium chloride flush  3 mL Intravenous Q12H  . sodium chloride flush  3 mL Intravenous Q12H   Continuous Infusions: . heparin 1,050 Units/hr (11/30/15 0203)  . nitroGLYCERIN Stopped (11/29/15 0300)   PRN Meds: sodium chloride, acetaminophen, ALPRAZolam, morphine injection, nitroGLYCERIN, ondansetron **OR** ondansetron (ZOFRAN) IV, sodium chloride flush   Vital Signs    Vitals:   11/29/15 2324 11/30/15 0203 11/30/15 0327 11/30/15 0815  BP: 124/72 (!) 166/83 138/84 (!) 154/77  Pulse: (!) 48  (!) 55 66  Resp: 17  11 16   Temp: 97.8 F (36.6 C)   97.4 F (36.3 C)  TempSrc: Oral  Oral Axillary  SpO2: 100%  100% 100%  Weight:   157 lb 3 oz (71.3 kg)   Height:         Intake/Output Summary (Last 24 hours) at 11/30/15 0856 Last data filed at 11/30/15 0700  Gross per 24 hour  Intake           421.51 ml  Output              925 ml  Net          -503.49 ml   Filed Weights   11/25/15 0440 11/26/15 0537 11/30/15 0327  Weight: 153 lb 3.2 oz (69.5 kg) 153 lb 4.8 oz (69.5 kg) 157 lb 3 oz (71.3 kg)    Physical Exam    GEN: Elderly African-American female Well nourished, well developed, in no acute distress.  HEENT: Grossly normal.  Neck: Supple, no JVD, carotid bruits, or masses. Cardiac: RRR, no murmurs, rubs, or gallops. No clubbing, cyanosis, edema.  Radials/DP/PT 2+ and equal bilaterally.  Respiratory:  Respirations regular and unlabored, clear to auscultation bilaterally. GI: Soft, nontender, nondistended, BS + x 4. MS: no deformity or atrophy. Skin: warm and dry, no rash. Neuro:  Strength and sensation are intact. Psych: AAOx3.  Normal affect.  Labs    CBC  Recent Labs  11/29/15 0222 11/30/15 0243  WBC 8.3 7.7  HGB 9.0* 9.0*  HCT 27.1* 26.9*  MCV 95.1 96.8  PLT 230 224   Basic Metabolic Panel  Recent Labs  11/29/15 0222 11/30/15 0243  NA 137 137  K 4.0 4.1  CL 107 105  CO2 23 24  GLUCOSE 130* 117*  BUN 31* 32*  CREATININE 1.72* 1.79*  CALCIUM 8.8* 9.0   Liver Function Tests No results for input(s): AST, ALT, ALKPHOS, BILITOT, PROT, ALBUMIN in the last 72 hours. No results for input(s): LIPASE, AMYLASE in the last 72 hours. Cardiac Enzymes No results for input(s): CKTOTAL, CKMB, CKMBINDEX, TROPONINI in the last 72 hours. BNP Invalid input(s): POCBNP D-Dimer No results for input(s): DDIMER in the last 72 hours. Hemoglobin A1C No results for input(s): HGBA1C in the last 72 hours. Fasting Lipid Panel No results for input(s): CHOL, HDL, LDLCALC, TRIG, CHOLHDL, LDLDIRECT in the last 72 hours. Thyroid Function Tests No results for input(s): TSH, T4TOTAL, T3FREE, THYROIDAB in the last 72 hours.  Invalid input(s):  FREET3  Telemetry    Atrial fibrillation with controlled rate.- Personally Reviewed  ECG    Performed on 11/27/15 revealed atrial fibrillation, controlled rate, and marked T-wave inversions lateral and precordial leads. - Personally Reviewed  Radiology    No results found.  Cardiac Studies   Echocardiogram 11/11/15: Study Conclusions  - Left ventricle: The cavity size was normal. Wall thickness was   increased in a pattern of mild LVH. Systolic function was normal.   The estimated ejection fraction was in the range of 60% to 65%.   There is akinesis of the mid-apicalanteroseptal and apical   myocardium. The study is not technically sufficient to allow   evaluation of LV diastolic function. - Aortic valve: Mildly calcified annulus. Trileaflet; mildly   thickened leaflets. - Mitral valve: Calcified annulus. There was trivial regurgitation. - Left atrium: The atrium was severely dilated. - Right atrium: The atrium was severely dilated. Central venous   pressure (est): 8 mm Hg. - Tricuspid valve: There was moderate regurgitation. - Pulmonary arteries: Systolic pressure was severely increased. PA   peak pressure: 84 mm Hg (S). - Pericardium, extracardiac: A trivial pericardial effusion was   identified posterior to the heart.  Impressions:  - Mild LVH with LVEF 60-65%. There is akinesis of the mid to apical   anteroseptal wall and apex with associated prominent   trabeculation, no obvious LV mural thrombus. Indeterminate   diastolic function. Severe biatrial enlargement. MAC with trivial   mitral regurgitation. Mildly sclerotic aortic valve. Moderate   tricuspid regurgitation with evidence of severe pulmonary   hypertension, PASP estimated 84 mmHg. Trivial posterior   pericardial effusion.  Cardiac catheterization findings described above: Conclusion   Conclusions: 1.  Severe, heavily calcified three-vessel coronary artery disease (as described below), including  chronic total occlusion of the mid LAD, 90% proximal LCx stenosis, and 90% ostial RCA lesion with moderate diffuse disease throughout the remainder of the RCA. 2.  Normal left and right heart filling pressures. 3.  Mild to moderate pulmonary hypertension. 4.  Normal cardiac output/index. 5.  Tortuous and calcified thoracic and abdominal aorta.      Patient Profile     82 year old with unstable angina presentation found to have severe calcified complex coronary artery disease. Not a surgical candidate. High-risk PCI as planned.   Assessment & Plan    1. Severe three-vessel coronary disease as outlined above, plan is for high-risk PCI of right coronary and circumflex proximal stenoses on 12/01/15. Precath orders are written. Procedure discussed with the patient.  2. CKD stage IV, plan IV hydration, prior to the procedure this tomorrow, starting this  evening. 3. Non-ST elevation MI, with preserved LV function.   Signed, Lesleigh Noe, MD  11/30/2015, 8:56 AM

## 2015-12-01 NOTE — Progress Notes (Signed)
Post cath sheath removed at 1530, pressure held x30 mins. dsg applied , level 0. Pt tolerated well

## 2015-12-01 NOTE — Progress Notes (Addendum)
ANTICOAGULATION CONSULT NOTE - Follow Up Consult  Pharmacy Consult for Heparin Indication: chest pain/ACS and atrial fibrillation  No Known Allergies  Patient Measurements: Height: 5\' 6"  (167.6 cm) Weight: 161 lb 9.6 oz (73.3 kg) IBW/kg (Calculated) : 59.3 Heparin Dosing Weight: 69.5 kg  Vital Signs: Temp: 98.5 F (36.9 C) (10/24 0700) Temp Source: Oral (10/24 0700) BP: 135/67 (10/24 0700) Pulse Rate: 60 (10/24 0700)  Labs:  Recent Labs  11/29/15 0222 11/30/15 0243 12/01/15 0245  HGB 9.0* 9.0* 8.5*  HCT 27.1* 26.9* 25.9*  PLT 230 224 226  LABPROT 14.9 15.1 15.5*  INR 1.17 1.18 1.22  HEPARINUNFRC 0.54 0.52 0.62  CREATININE 1.72* 1.79* 1.78*    Estimated Creatinine Clearance: 25 mL/min (by C-G formula based on SCr of 1.78 mg/dL (H)).   Assessment:  82 y.o. female with chest pain/CAD and h/o Afib. S/p cardiac cath 10/19 with 3VCAD (poor CABG candidate).  She is on warfarin PTA for AFib (5 mg by mouth daily except 2.5 mg by mouth on Wednesdays and Sundays).  Warfarin is on hold for for PCI on 10/24.  Pharmacy consulted to dose heparin -Heparin level= 0.62      Goal of Therapy:  Heparin level 0.3-0.7 units/ml Monitor platelets by anticoagulation protocol: Yes   Plan:  Continue heparin drip at 1050 units/hr. Daily Heparin level and CBC Will follow plans post cath  Harland Germanndrew Eileen Croswell, Pharm D 12/01/2015 9:31 AM  Addendum -s/p cath with DES to LCx on plavix and aspirin -pharmacy to restart coumadin.  -INR= 1.22  Plan -Coumadin 7.5mg  po x1 -Daily PT/INR  Harland GermanAndrew Myosha Cuadras, Pharm D 12/01/2015 1:09 PM

## 2015-12-01 NOTE — Progress Notes (Addendum)
Patient Name: Melissa [name] Date of Encounter: 12/01/2015  Primary Cardiologist: Nona DellSamuel McDowell, M.D.  Hospital Problem List     Active Problems:   Unstable angina (HCC)   Chronic kidney disease (CKD), stage IV (severe) (HCC)   NSTEMI (non-ST elevated myocardial infarction) (HCC)   Pulmonary hypertension     Subjective   Relatively quiet night. No chest discomfort. Some bradycardia overnight. High-risk PCI being performed today with rotational atherectomy of a very tortuous eccentric proximal circumflex stenosis.  Inpatient Medications    Scheduled Meds: . [MAR Hold] aspirin EC  81 mg Oral Daily  . [MAR Hold] atorvastatin  40 mg Oral Daily  . [MAR Hold] clopidogrel  75 mg Oral Daily  . [MAR Hold] darifenacin  7.5 mg Oral Daily  . [MAR Hold] hydrALAZINE  100 mg Oral TID  . [MAR Hold] insulin aspart  0-9 Units Subcutaneous Q4H  . [MAR Hold] isosorbide mononitrate  60 mg Oral Daily  . [MAR Hold] latanoprost  1 drop Both Eyes QHS  . [MAR Hold] mouth rinse  15 mL Mouth Rinse BID  . [MAR Hold] metoprolol tartrate  25 mg Oral BID  . [MAR Hold] pantoprazole  40 mg Oral Q0600  . [MAR Hold] sodium chloride flush  3 mL Intravenous Q12H  . [MAR Hold] sodium chloride flush  3 mL Intravenous Q12H  . sodium chloride flush  3 mL Intravenous Q12H   Continuous Infusions: . sodium chloride 1 mL/kg/hr (11/30/15 2021)  . heparin 1,050 Units/hr (12/01/15 0024)  . heparin    . [MAR Hold] nitroGLYCERIN Stopped (11/29/15 0300)   PRN Meds: [MAR Hold] sodium chloride, sodium chloride, [MAR Hold] acetaminophen, [MAR Hold] ALPRAZolam, fentaNYL, heparin, heparin, lidocaine (PF), midazolam, [MAR Hold]  morphine injection, [MAR Hold] nitroGLYCERIN, nitroGLYCERIN, [MAR Hold] ondansetron **OR** [MAR Hold] ondansetron (ZOFRAN) IV, [MAR Hold] sodium chloride flush, sodium chloride flush   Vital Signs    Vitals:   12/01/15 0025 12/01/15 0407 12/01/15 0700 12/01/15 1020  BP: 137/65 (!) 137/91  135/67   Pulse: (!) 55 (!) 58 60   Resp: 18 17 12    Temp: 98.2 F (36.8 C) 98.3 F (36.8 C) 98.5 F (36.9 C)   TempSrc: Oral Oral Oral   SpO2: 98% 100% 100% 100%  Weight:  161 lb 9.6 oz (73.3 kg)    Height:        Intake/Output Summary (Last 24 hours) at 12/01/15 1133 Last data filed at 12/01/15 0654  Gross per 24 hour  Intake          1645.65 ml  Output              625 ml  Net          1020.65 ml   Filed Weights   11/26/15 0537 11/30/15 0327 12/01/15 0407  Weight: 153 lb 4.8 oz (69.5 kg) 157 lb 3 oz (71.3 kg) 161 lb 9.6 oz (73.3 kg)    Physical Exam    ZOX:WRUEAVWGEN:Elderly and frail but in no acute distress.  HEENT: Grossly normal.  Neck: Supple, no JVD, carotid bruits, or masses. Cardiac: RRR, no murmurs, rubs, or gallops. No clubbing, cyanosis, edema.  Radials/DP/PT 2+ and equal bilaterally.  Respiratory:  Respirations regular and unlabored, clear to auscultation bilaterally. GI: Soft, nontender, nondistended, BS + x 4. MS: no deformity or atrophy. Skin: warm and dry, no rash. Neuro:  Strength and sensation are intact. Psych: AAOx3.  Normal affect.  Labs    CBC  Recent Labs  11/30/15 0243 12/01/15 0245  WBC 7.7 7.6  HGB 9.0* 8.5*  HCT 26.9* 25.9*  MCV 96.8 96.6  PLT 224 226   Basic Metabolic Panel  Recent Labs  11/30/15 0243 12/01/15 0245  NA 137 137  K 4.1 4.4  CL 105 107  CO2 24 24  GLUCOSE 117* 141*  BUN 32* 33*  CREATININE 1.79* 1.78*  CALCIUM 9.0 8.9   Liver Function Tests No results for input(s): AST, ALT, ALKPHOS, BILITOT, PROT, ALBUMIN in the last 72 hours. No results for input(s): LIPASE, AMYLASE in the last 72 hours. Cardiac Enzymes No results for input(s): CKTOTAL, CKMB, CKMBINDEX, TROPONINI in the last 72 hours. BNP Invalid input(s): POCBNP D-Dimer No results for input(s): DDIMER in the last 72 hours. Hemoglobin A1C No results for input(s): HGBA1C in the last 72 hours. Fasting Lipid Panel No results for input(s): CHOL, HDL,  LDLCALC, TRIG, CHOLHDL, LDLDIRECT in the last 72 hours. Thyroid Function Tests No results for input(s): TSH, T4TOTAL, T3FREE, THYROIDAB in the last 72 hours.  Invalid input(s): FREET3  Telemetry    Sinus bradycardia. Occasional PVCs. - Personally Reviewed  ECG    No new data - Personally Reviewed prior tracings  Radiology    No results found.  Cardiac Studies   No new data  Patient Profile     82 year old with unstable angina presentation found to have severe calcified complex coronary artery disease. Not a surgical candidate. High-risk PCI as plannedChronic stage III/IV kidney disease. Mild to moderate dementia.  Assessment & Plan    1. Severe three-vessel coronary disease as outlined above, plan is for high-risk PCI circumflex proximal stenoses using rotational atherectomy on 12/01/15. Hips move and relatively little contrast used, ostial RCA stenting will also be performed. Precath orders are written. Procedure discussed with the patient on yesterday's visit. 2. Acute on chronic CKD stage IV, plan IV hydration overnight was completed. Creatinine is stable at 1.78. 3. Non-ST elevation MI, with preserved LV function.  4. Dementia, moderate in severity 5. Bradycardia, requiring decrease in dose of beta blocker therapy.   Signed, Lesleigh Noe, MD  12/01/2015, 11:33 AM

## 2015-12-02 LAB — GLUCOSE, CAPILLARY
GLUCOSE-CAPILLARY: 141 mg/dL — AB (ref 65–99)
GLUCOSE-CAPILLARY: 204 mg/dL — AB (ref 65–99)
GLUCOSE-CAPILLARY: 114 mg/dL — AB (ref 65–99)
Glucose-Capillary: 115 mg/dL — ABNORMAL HIGH (ref 65–99)
Glucose-Capillary: 121 mg/dL — ABNORMAL HIGH (ref 65–99)
Glucose-Capillary: 164 mg/dL — ABNORMAL HIGH (ref 65–99)

## 2015-12-02 LAB — CBC
HEMATOCRIT: 23.4 % — AB (ref 36.0–46.0)
HEMOGLOBIN: 7.8 g/dL — AB (ref 12.0–15.0)
MCH: 32.5 pg (ref 26.0–34.0)
MCHC: 33.3 g/dL (ref 30.0–36.0)
MCV: 97.5 fL (ref 78.0–100.0)
Platelets: 205 10*3/uL (ref 150–400)
RBC: 2.4 MIL/uL — ABNORMAL LOW (ref 3.87–5.11)
RDW: 14.7 % (ref 11.5–15.5)
WBC: 9.2 10*3/uL (ref 4.0–10.5)

## 2015-12-02 LAB — BASIC METABOLIC PANEL
ANION GAP: 9 (ref 5–15)
BUN: 26 mg/dL — AB (ref 6–20)
CHLORIDE: 110 mmol/L (ref 101–111)
CO2: 21 mmol/L — AB (ref 22–32)
Calcium: 9 mg/dL (ref 8.9–10.3)
Creatinine, Ser: 1.41 mg/dL — ABNORMAL HIGH (ref 0.44–1.00)
GFR calc Af Amer: 39 mL/min — ABNORMAL LOW (ref >60)
GFR calc non Af Amer: 34 mL/min — ABNORMAL LOW (ref >60)
GLUCOSE: 131 mg/dL — AB (ref 65–99)
POTASSIUM: 4.5 mmol/L (ref 3.5–5.1)
Sodium: 140 mmol/L (ref 135–145)

## 2015-12-02 LAB — PROTIME-INR
INR: 1.23
Prothrombin Time: 15.6 seconds — ABNORMAL HIGH (ref 11.4–15.2)

## 2015-12-02 MED ORDER — WARFARIN SODIUM 7.5 MG PO TABS
7.5000 mg | ORAL_TABLET | Freq: Once | ORAL | Status: AC
  Administered 2015-12-02: 7.5 mg via ORAL
  Filled 2015-12-02: qty 1

## 2015-12-02 MED FILL — Verapamil HCl IV Soln 2.5 MG/ML: INTRAVENOUS | Qty: 4 | Status: AC

## 2015-12-02 MED FILL — Nitroglycerin IV Soln 100 MCG/ML in D5W: INTRA_ARTERIAL | Qty: 10 | Status: AC

## 2015-12-02 NOTE — Progress Notes (Signed)
Patient Name: Melissa [name] Date of Encounter: 12/02/2015  Primary Cardiologist: Nona Dell, M.D.  Hospital Problem List     Principal Problem:   NSTEMI (non-ST elevated myocardial infarction) (HCC) Active Problems:   Chronic kidney disease (CKD), stage IV (severe) (HCC)   Pulmonary hypertension   Bradycardia   CAD in native artery     Subjective   Feels okay. No chest pain overnight. No discomfort in the right femoral region where the procedure was performed yesterday.  Inpatient Medications    Scheduled Meds: . aspirin EC  81 mg Oral Daily  . atorvastatin  40 mg Oral Daily  . clopidogrel  75 mg Oral Daily  . darifenacin  7.5 mg Oral Daily  . hydrALAZINE  100 mg Oral TID  . insulin aspart  0-9 Units Subcutaneous Q4H  . isosorbide mononitrate  60 mg Oral Daily  . latanoprost  1 drop Both Eyes QHS  . mouth rinse  15 mL Mouth Rinse BID  . metoprolol tartrate  25 mg Oral BID  . pantoprazole  40 mg Oral Q0600  . sodium chloride flush  3 mL Intravenous Q12H  . sodium chloride flush  3 mL Intravenous Q12H  . warfarin  7.5 mg Oral ONCE-1800  . Warfarin - Pharmacist Dosing Inpatient   Does not apply q1800   Continuous Infusions: . nitroGLYCERIN Stopped (11/29/15 0300)   PRN Meds: sodium chloride, acetaminophen, ALPRAZolam, hydrALAZINE, morphine injection, nitroGLYCERIN, ondansetron **OR** ondansetron (ZOFRAN) IV, sodium chloride flush   Vital Signs    Vitals:   12/02/15 0500 12/02/15 0600 12/02/15 0700 12/02/15 0800  BP: (!) 132/59 133/66 122/63 (!) 147/65  Pulse: (!) 58 69 (!) 57 65  Resp: 16 18 15 12   Temp:    98.7 F (37.1 C)  TempSrc:    Oral  SpO2: 100% 98% 92% 96%  Weight:      Height:        Intake/Output Summary (Last 24 hours) at 12/02/15 1038 Last data filed at 12/02/15 0800  Gross per 24 hour  Intake              950 ml  Output              425 ml  Net              525 ml   Filed Weights   11/26/15 0537 11/30/15 0327 12/01/15 0407    Weight: 153 lb 4.8 oz (69.5 kg) 157 lb 3 oz (71.3 kg) 161 lb 9.6 oz (73.3 kg)    Physical Exam   GEN: Well nourished, well developed, in no acute distress. . Elderly. Expressionless. HEENT: Grossly normal.  Neck: Supple, no JVD, carotid bruits, or masses. Cardiac: RRR, no murmurs, rubs, or gallops. No clubbing, cyanosis, edema.  Radials/DP/PT 2+ and equal bilaterally. There is no evidence of hematoma in the right femoral. Respiratory:  Respirations regular and unlabored, clear to auscultation bilaterally. GI: Soft, nontender, nondistended, BS + x 4. MS: no deformity or atrophy. Skin: warm and dry, no rash. Neuro:  Strength and sensation are intact. Psych: AAOx3.  Normal affect.  Labs    CBC  Recent Labs  12/01/15 0245 12/02/15 0244  WBC 7.6 9.2  HGB 8.5* 7.8*  HCT 25.9* 23.4*  MCV 96.6 97.5  PLT 226 205   Basic Metabolic Panel  Recent Labs  12/01/15 0245 12/02/15 0244  NA 137 140  K 4.4 4.5  CL 107 110  CO2 24 21*  GLUCOSE 141* 131*  BUN 33* 26*  CREATININE 1.78* 1.41*  CALCIUM 8.9 9.0   Liver Function Tests No results for input(s): AST, ALT, ALKPHOS, BILITOT, PROT, ALBUMIN in the last 72 hours. No results for input(s): LIPASE, AMYLASE in the last 72 hours. Cardiac Enzymes No results for input(s): CKTOTAL, CKMB, CKMBINDEX, TROPONINI in the last 72 hours. BNP Invalid input(s): POCBNP D-Dimer No results for input(s): DDIMER in the last 72 hours. Hemoglobin A1C No results for input(s): HGBA1C in the last 72 hours. Fasting Lipid Panel No results for input(s): CHOL, HDL, LDLCALC, TRIG, CHOLHDL, LDLDIRECT in the last 72 hours. Thyroid Function Tests No results for input(s): TSH, T4TOTAL, T3FREE, THYROIDAB in the last 72 hours.  Invalid input(s): FREET3  Telemetry    Atrial fibrillation with controlled rate - Personally Reviewed  ECG    Atrial fibrillation, controlled rate, precordial T-wave inversion unchanged from prior tracings. - Personally  Reviewed  Radiology    No results found.  Cardiac Studies   No new data other than successful rotational atherectomy of the circumflex coronary on yesterday. 12/01/15: Conclusion   Successful PCI of critical proximal LCx stenosis using rotational atherectomy and a 3.5x12 mm Synergy DES. Procedure complicated by a non-flow limiting distal wire dissection, patient CP-free and hemodynamically stable at procedure completion.  Recommendations:  Medical management of residual CAD, unless recurrent angina  If recurrent angina, PCI of the ostial RCA  Continue ASA and plavix. Start warfarin tonight without heparin bridging\     Patient Profile     82 year old with unstable angina presentation found to have severe calcified complex coronary artery disease. Chronic kidney disease stage III. Not a surgical candidate. High-risk PCI circumflex roto-stent 12/01/15. Significant LAD and right coronary disease will be treated medically unless recurrent angina.   Assessment & Plan    1. Severe three-vessel coronary disease as outlined above, Successful roto-stent circumflex proximal stenoses on 12/01/15. Plan is for medical therapy of right coronary and LAD disease unless recurrent unstable angina. 2. Acute on chronic CKD stage IV, appetite IV hydration, kidney function is actually improved this morning with a creatinine of 1.41.  3. Non-ST elevation MI, with preserved LV function.  4. Dementia  Plan is to begin ambulating. Transferred to telemetry. Resume Coumadin. Discharge in the next 24-48 hours depending upon hospital course.  Signed, Lesleigh NoeHenry W Smith III, MD  12/02/2015, 10:38 AM

## 2015-12-02 NOTE — Progress Notes (Addendum)
ANTICOAGULATION CONSULT NOTE - Follow Up Consult  Pharmacy Consult for Warfarin Indication: atrial fibrillation  Assessment: 82 y.o. female with chest pain/CAD and h/o Afib. S/p cardiac cath 10/19 with 3VCAD (poor CABG candidate).   Warfarin restarted post PCI yesterday. INR still low at 1.2 as expected. Repeat higher dose tonight. Hgb is trending down 9>8>7.8 this am, no bleeding issues or hematoma noted will follow closely.   PTA warfarin dose was 5 mg by mouth daily except 2.5 mg by mouth on Wednesdays and Sundays    Goal of Therapy:  INR goal 2-3 Monitor platelets by anticoagulation protocol: Yes   Plan:  Warfarin 7.5mg  tonight Daily INR until stable Recheck cbc in am   No Known Allergies  Patient Measurements: Height: 5\' 6"  (167.6 cm) Weight: 161 lb 9.6 oz (73.3 kg) IBW/kg (Calculated) : 59.3 Heparin Dosing Weight: 69.5 kg  Vital Signs: Temp: 98.7 F (37.1 C) (10/25 0800) Temp Source: Oral (10/25 0800) BP: 147/65 (10/25 0800) Pulse Rate: 65 (10/25 0800)  Labs:  Recent Labs  11/30/15 0243 12/01/15 0245 12/02/15 0244  HGB 9.0* 8.5* 7.8*  HCT 26.9* 25.9* 23.4*  PLT 224 226 205  LABPROT 15.1 15.5* 15.6*  INR 1.18 1.22 1.23  HEPARINUNFRC 0.52 0.62  --   CREATININE 1.79* 1.78* 1.41*    Estimated Creatinine Clearance: 31.5 mL/min (by C-G formula based on SCr of 1.41 mg/dL (H)).  Sheppard CoilFrank Wilson PharmD., BCPS Clinical Pharmacist Pager [phone] 12/02/2015 9:48 AM

## 2015-12-02 NOTE — Progress Notes (Signed)
CARDIAC REHAB PHASE I   PRE:  Rate/Rhythm: 69 afib  BP:  Supine: 119/63  Sitting:   Standing:    SaO2: 98%RA  MODE:  Ambulation: 210 ft   POST:  Rate/Rhythm: 101 afib  BP:  Supine:   Sitting: 131/53  Standing:    SaO2: 97%RA 0932-1030 Pt walked 210 ft with gait belt use, rolling walker, and asst x 1. Needed cues to keep closer to walker. Denied CP. MI education [facility] with [facility] seemed to have some difficulty comprehending. Daughter will re enforce ed. Stressed importance of plavix with stent. Reviewed NTG use, MI restrictions and healthy eating. Encouraged walking as tolerated with assistance. Did not give ex ed. Discussed CRP 2 and daughter will see as pt gets stronger at home if she can do. Will send referral to [place]. To recliner with call bell.   Luetta Nuttingharlene Admiral Marcucci, RN BSN  12/02/2015 10:25 AM

## 2015-12-03 DIAGNOSIS — Z7901 Long term (current) use of anticoagulants: Secondary | ICD-10-CM

## 2015-12-03 DIAGNOSIS — D638 Anemia in other chronic diseases classified elsewhere: Secondary | ICD-10-CM

## 2015-12-03 LAB — BASIC METABOLIC PANEL
ANION GAP: 7 (ref 5–15)
BUN: 33 mg/dL — AB (ref 6–20)
CHLORIDE: 109 mmol/L (ref 101–111)
CO2: 22 mmol/L (ref 22–32)
Calcium: 8.7 mg/dL — ABNORMAL LOW (ref 8.9–10.3)
Creatinine, Ser: 1.78 mg/dL — ABNORMAL HIGH (ref 0.44–1.00)
GFR calc Af Amer: 29 mL/min — ABNORMAL LOW (ref >60)
GFR, EST NON AFRICAN AMERICAN: 25 mL/min — AB (ref >60)
GLUCOSE: 115 mg/dL — AB (ref 65–99)
POTASSIUM: 4.5 mmol/L (ref 3.5–5.1)
SODIUM: 138 mmol/L (ref 135–145)

## 2015-12-03 LAB — CBC
HEMATOCRIT: 20.4 % — AB (ref 36.0–46.0)
HEMATOCRIT: 21.3 % — AB (ref 36.0–46.0)
HEMOGLOBIN: 6.7 g/dL — AB (ref 12.0–15.0)
HEMOGLOBIN: 7.1 g/dL — AB (ref 12.0–15.0)
MCH: 32.1 pg (ref 26.0–34.0)
MCH: 32.6 pg (ref 26.0–34.0)
MCHC: 32.8 g/dL (ref 30.0–36.0)
MCHC: 33.3 g/dL (ref 30.0–36.0)
MCV: 97.6 fL (ref 78.0–100.0)
MCV: 97.7 fL (ref 78.0–100.0)
Platelets: 185 10*3/uL (ref 150–400)
Platelets: 185 10*3/uL (ref 150–400)
RBC: 2.09 MIL/uL — ABNORMAL LOW (ref 3.87–5.11)
RBC: 2.18 MIL/uL — AB (ref 3.87–5.11)
RDW: 15 % (ref 11.5–15.5)
RDW: 15 % (ref 11.5–15.5)
WBC: 8.7 10*3/uL (ref 4.0–10.5)
WBC: 8.8 10*3/uL (ref 4.0–10.5)

## 2015-12-03 LAB — GLUCOSE, CAPILLARY
GLUCOSE-CAPILLARY: 170 mg/dL — AB (ref 65–99)
GLUCOSE-CAPILLARY: 161 mg/dL — AB (ref 65–99)
GLUCOSE-CAPILLARY: 216 mg/dL — AB (ref 65–99)
Glucose-Capillary: 140 mg/dL — ABNORMAL HIGH (ref 65–99)
Glucose-Capillary: 194 mg/dL — ABNORMAL HIGH (ref 65–99)
Glucose-Capillary: 212 mg/dL — ABNORMAL HIGH (ref 65–99)

## 2015-12-03 LAB — PREPARE RBC (CROSSMATCH)

## 2015-12-03 LAB — PROTIME-INR
INR: 1.48
Prothrombin Time: 18 seconds — ABNORMAL HIGH (ref 11.4–15.2)

## 2015-12-03 LAB — ABO/RH: ABO/RH(D): O POS

## 2015-12-03 MED ORDER — SODIUM CHLORIDE 0.9 % IV SOLN: Freq: Once | INTRAVENOUS | Status: DC

## 2015-12-03 MED ORDER — TORSEMIDE 20 MG PO TABS
20.0000 mg | ORAL_TABLET | Freq: Two times a day (BID) | ORAL | Status: DC
Start: 2015-12-04 — End: 2015-12-05
  Administered 2015-12-04 – 2015-12-05 (×3): 20 mg via ORAL
  Filled 2015-12-03 (×4): qty 1

## 2015-12-03 MED ORDER — FUROSEMIDE 10 MG/ML IJ SOLN
40.0000 mg | Freq: Once | INTRAMUSCULAR | Status: AC
Start: 2015-12-03 — End: 2015-12-03
  Administered 2015-12-03: 40 mg via INTRAVENOUS
  Filled 2015-12-03: qty 4

## 2015-12-03 MED ORDER — SODIUM CHLORIDE 0.9 % IV SOLN
Freq: Once | INTRAVENOUS | Status: AC
  Administered 2015-12-03: 10:00:00 via INTRAVENOUS

## 2015-12-03 NOTE — Progress Notes (Signed)
CARDIAC REHAB PHASE I   PRE:  Rate/Rhythm: 54 afib  BP:  Supine:   Sitting: 99/43  Standing:    SaO2: 99%RA  MODE:  Ambulation: 210 ft   POST:  Rate/Rhythm: 83  BP:  Supine:   Sitting: 97/67  Standing:    SaO2: 98%RA 1420-1437 Pt walked 210 ft on RA with gait belt use and rolling walker and asst x 1 with fairly steady gait. Denied CP. Stated just a little tired. Back to recliner with family present.   Melissa Nuttingharlene Willian Donson, RN BSN  12/03/2015 2:33 PM

## 2015-12-03 NOTE — Progress Notes (Signed)
CRITICAL VALUE ALERT  Critical value received:  Hemoglobin 6.7  Date of notification:  12/03/15  Time of notification:  0352  Critical value read back:Yes.    Nurse who received alert:  Cay SchillingsShelby Leiby Pigeon, RN  MD notified (1st page):  Hochrein  Time of first page:  0415  MD notified (2nd page):  Time of second page:  Responding MD:  Hochrein  Orders for a repeat stat hemoglobin and type and screen obtained.

## 2015-12-03 NOTE — Progress Notes (Signed)
ANTICOAGULATION CONSULT NOTE - Follow Up Consult  Pharmacy Consult for Warfarin Indication: atrial fibrillation  Assessment: 82 y.o. female with chest pain/CAD and h/o Afib. S/p cardiac cath 10/19 with 3VCAD (poor CABG candidate).   Warfarin restarted post PCI. INR still low at 1.48. Hgb is trending down 9>8>7.8>7.1, patient received significant hydration prior to cath which could be causing some of the anemia. No overt bleeding noted, plans to hold warfarin today and watch cbc after transfusing one unit.    PTA warfarin dose was 5 mg by mouth daily except 2.5 mg by mouth on Wednesdays and Sundays    Goal of Therapy:  INR goal 2-3 Monitor platelets by anticoagulation protocol: Yes   Plan:  Hold warfarin tonight Daily INR until stable Recheck cbc in am   No Known Allergies  Patient Measurements: Height: 5\' 6"  (167.6 cm) Weight: 161 lb 9.6 oz (73.3 kg) IBW/kg (Calculated) : 59.3 Heparin Dosing Weight: 69.5 kg  Vital Signs: Temp: 99.3 F (37.4 C) (10/26 0939) Temp Source: Oral (10/26 0939) BP: 149/67 (10/26 0939) Pulse Rate: 77 (10/26 0939)  Labs:  Recent Labs  12/01/15 0245 12/02/15 0244 12/03/15 0229 12/03/15 0505  HGB 8.5* 7.8* 6.7* 7.1*  HCT 25.9* 23.4* 20.4* 21.3*  PLT 226 205 185 185  LABPROT 15.5* 15.6* 18.0*  --   INR 1.22 1.23 1.48  --   HEPARINUNFRC 0.62  --   --   --   CREATININE 1.78* 1.41* 1.78*  --     Estimated Creatinine Clearance: 25 mL/min (by C-G formula based on SCr of 1.78 mg/dL (H)).  Sheppard CoilFrank Kari Montero PharmD., BCPS Clinical Pharmacist Pager [phone] 12/03/2015 10:13 AM

## 2015-12-03 NOTE — Progress Notes (Addendum)
Patient Name: Melissa [name] Date of Encounter: 12/03/2015  Primary Cardiologist: Ival BibleS. McDowell, M.D.  Hospital Problem List     Principal Problem:   NSTEMI (non-ST elevated myocardial infarction) (HCC) Active Problems:   Chronic kidney disease (CKD), stage IV (severe) (HCC)   Pulmonary hypertension   Bradycardia   CAD in native artery     Subjective   The patient really does not complain. States that with some chest discomfort when she awakened this morning and sat on the commode. Difficult to tell if it was musculoskeletal or cardiac. She is unable to eat lab her rate on the circumstances. She does feel somewhat short of breath.  Inpatient Medications    Scheduled Meds: . sodium chloride   Intravenous Once  . aspirin EC  81 mg Oral Daily  . atorvastatin  40 mg Oral Daily  . clopidogrel  75 mg Oral Daily  . darifenacin  7.5 mg Oral Daily  . hydrALAZINE  100 mg Oral TID  . insulin aspart  0-9 Units Subcutaneous Q4H  . isosorbide mononitrate  60 mg Oral Daily  . latanoprost  1 drop Both Eyes QHS  . mouth rinse  15 mL Mouth Rinse BID  . metoprolol tartrate  25 mg Oral BID  . pantoprazole  40 mg Oral Q0600  . sodium chloride flush  3 mL Intravenous Q12H  . sodium chloride flush  3 mL Intravenous Q12H  . Warfarin - Pharmacist Dosing Inpatient   Does not apply q1800   Continuous Infusions: . nitroGLYCERIN Stopped (11/29/15 0300)   PRN Meds: sodium chloride, acetaminophen, ALPRAZolam, hydrALAZINE, morphine injection, nitroGLYCERIN, ondansetron **OR** ondansetron (ZOFRAN) IV, sodium chloride flush   Vital Signs    Vitals:   12/03/15 0400 12/03/15 0500 12/03/15 0600 12/03/15 0700  BP: (!) 129/57 115/67 120/64 115/78  Pulse: (!) 58 (!) 58 (!) 54 63  Resp: 17 13 16 16   Temp:    98.1 F (36.7 C)  TempSrc:    Oral  SpO2: 100% 100% 100% 100%  Weight:      Height:        Intake/Output Summary (Last 24 hours) at 12/03/15 0913 Last data filed at 12/03/15 0600  Gross per 24 hour  Intake              720 ml  Output              600 ml  Net              120 ml   Filed Weights   11/26/15 0537 11/30/15 0327 12/01/15 0407  Weight: 153 lb 4.8 oz (69.5 kg) 157 lb 3 oz (71.3 kg) 161 lb 9.6 oz (73.3 kg)    Physical Exam    GEN: Well nourished, well developed, in no acute distress.  HEENT: Grossly normal.  Neck: Supple, no JVD, carotid bruits, or masses. Cardiac: RRR, no murmurs, rubs, or gallops. No clubbing, cyanosis, edema.  Radials/DP/PT 2+ and equal bilaterally.  Respiratory:  Respirations regular and unlabored, clear to auscultation bilaterally. GI: Soft, nontender, nondistended, BS + x 4. MS: no deformity or atrophy. Skin: warm and dry, no rash. Neuro:  Decreased memory. Psych: Flat affect.  Labs    CBC  Recent Labs  12/03/15 0229 12/03/15 0505  WBC 8.7 8.8  HGB 6.7* 7.1*  HCT 20.4* 21.3*  MCV 97.6 97.7  PLT 185 185   Basic Metabolic Panel  Recent Labs  12/02/15 0244 12/03/15 0229  NA 140 138  K 4.5 4.5  CL 110 109  CO2 21* 22  GLUCOSE 131* 115*  BUN 26* 33*  CREATININE 1.41* 1.78*  CALCIUM 9.0 8.7*   Liver Function Tests No results for input(s): AST, ALT, ALKPHOS, BILITOT, PROT, ALBUMIN in the last 72 hours. No results for input(s): LIPASE, AMYLASE in the last 72 hours. Cardiac Enzymes No results for input(s): CKTOTAL, CKMB, CKMBINDEX, TROPONINI in the last 72 hours. BNP Invalid input(s): POCBNP D-Dimer No results for input(s): DDIMER in the last 72 hours. Hemoglobin A1C No results for input(s): HGBA1C in the last 72 hours. Fasting Lipid Panel No results for input(s): CHOL, HDL, LDLCALC, TRIG, CHOLHDL, LDLDIRECT in the last 72 hours. Thyroid Function Tests No results for input(s): TSH, T4TOTAL, T3FREE, THYROIDAB in the last 72 hours.  Invalid input(s): FREET3  Telemetry    Atrial fibrillation with controlled ventricular response and with occasional PVCs. - Personally Reviewed  ECG    Atrial  fibrillation, marked precordial T-wave inversion as well as lateral T wave inversion. - Personally Reviewed  Radiology    No results found.  Cardiac Studies   No new studies.  Patient Profile     82 year old with unstable angina presentation found to have severe calcified complex coronary artery disease. Chronic anemia, atrial fibrillation and kidney disease stage III. Not a surgical candidate. High-risk PCI circumflex roto-stent 12/01/15 associated with distal dissection. Significant LAD and right coronary disease will be treated medically unless recurrent angina.  Assessment & Plan      1. Severe three-vessel coronary disease as outlined above, Successful roto-stent circumflex proximal stenoses on 12/01/15. Plan is for medical therapy of right coronary and LAD disease unless recurrent unstable angina. 2. Acute on chronic CKD stage IV, kidney function back to baseline with creatinine of 1.7 after an initial decline related to hydration in preparation for PCI. ACE inhibitor therapy has also been on hold. Plan resume diuretic therapy today.  3. Acute on chronic diastolic heart failure, with preserved LV function with EF 60% 11/11/15. Plan resume diuretic therapy today. 4. Severe anemia, with hemoglobin less than 7 aggravating ischemic heart disease. Plan to transfuse at least one unit of packed red cells. A component of the drop in hemoglobin is related to hydration in preparation for PCI. Plan transfer her to telemetry later today if all goes well with the transfusion.Continue Coumadin loading. INR today is 1.5. Continue  antiplatelet therapy with Plavix. 5. Chronic atrial fibrillation with rate control. The patient is on chronic anticoagulation. With decreasing hemoglobin, anticoagulation will be discontinued until hemoglobin is stable. 5. Dementia 6. Discussion with family concerning end-of-life planning and consideration of hospice.  Signed, Lesleigh Noe, MD  12/03/2015, 9:13 AM

## 2015-12-04 DIAGNOSIS — Z955 Presence of coronary angioplasty implant and graft: Secondary | ICD-10-CM | POA: Insufficient documentation

## 2015-12-04 LAB — TYPE AND SCREEN
ABO/RH(D): O POS
ANTIBODY SCREEN: NEGATIVE
Unit division: 0

## 2015-12-04 LAB — GLUCOSE, CAPILLARY
GLUCOSE-CAPILLARY: 93 mg/dL (ref 65–99)
GLUCOSE-CAPILLARY: 111 mg/dL — AB (ref 65–99)
GLUCOSE-CAPILLARY: 184 mg/dL — AB (ref 65–99)
GLUCOSE-CAPILLARY: 186 mg/dL — AB (ref 65–99)
Glucose-Capillary: 252 mg/dL — ABNORMAL HIGH (ref 65–99)

## 2015-12-04 LAB — CBC
HEMATOCRIT: 24.4 % — AB (ref 36.0–46.0)
Hemoglobin: 8.1 g/dL — ABNORMAL LOW (ref 12.0–15.0)
MCH: 31.8 pg (ref 26.0–34.0)
MCHC: 33.2 g/dL (ref 30.0–36.0)
MCV: 95.7 fL (ref 78.0–100.0)
Platelets: 189 10*3/uL (ref 150–400)
RBC: 2.55 MIL/uL — ABNORMAL LOW (ref 3.87–5.11)
RDW: 15.8 % — AB (ref 11.5–15.5)
WBC: 9 10*3/uL (ref 4.0–10.5)

## 2015-12-04 LAB — BASIC METABOLIC PANEL
Anion gap: 5 (ref 5–15)
BUN: 34 mg/dL — ABNORMAL HIGH (ref 6–20)
CHLORIDE: 111 mmol/L (ref 101–111)
CO2: 21 mmol/L — ABNORMAL LOW (ref 22–32)
CREATININE: 1.8 mg/dL — AB (ref 0.44–1.00)
Calcium: 8.9 mg/dL (ref 8.9–10.3)
GFR, EST AFRICAN AMERICAN: 29 mL/min — AB (ref >60)
GFR, EST NON AFRICAN AMERICAN: 25 mL/min — AB (ref >60)
Glucose, Bld: 81 mg/dL (ref 65–99)
POTASSIUM: 4.5 mmol/L (ref 3.5–5.1)
SODIUM: 137 mmol/L (ref 135–145)

## 2015-12-04 LAB — BRAIN NATRIURETIC PEPTIDE: B NATRIURETIC PEPTIDE 5: 943.6 pg/mL — AB (ref 0.0–100.0)

## 2015-12-04 LAB — PROTIME-INR
INR: 1.44
Prothrombin Time: 17.7 seconds — ABNORMAL HIGH (ref 11.4–15.2)

## 2015-12-04 MED ORDER — POTASSIUM CHLORIDE CRYS ER 20 MEQ PO TBCR
20.0000 meq | EXTENDED_RELEASE_TABLET | Freq: Once | ORAL | Status: AC
  Administered 2015-12-04: 20 meq via ORAL
  Filled 2015-12-04: qty 1

## 2015-12-04 MED ORDER — WARFARIN SODIUM 7.5 MG PO TABS
7.5000 mg | ORAL_TABLET | Freq: Once | ORAL | Status: AC
  Administered 2015-12-04: 7.5 mg via ORAL
  Filled 2015-12-04: qty 1

## 2015-12-04 NOTE — Progress Notes (Signed)
Spoke w patient and fam. [facility] Pt has aid 3x/wk and they will call agency to see if they can increase help.agency in [place] Pt followed [facility] also. Pt to transf to tel today. Cm will cont to follow.

## 2015-12-04 NOTE — Progress Notes (Addendum)
Patient Name: Melissa [name] Date of Encounter: 12/04/2015  Primary Cardiologist: Nona DellSamuel McDowell, M.D.  Hospital Problem List     Principal Problem:   NSTEMI (non-ST elevated myocardial infarction) [facility](HCC) Active Problems:   Persistent atrial fibrillation (HCC)   Long term current use of anticoagulant   Chronic kidney disease (CKD), stage IV (severe) (HCC)   Pulmonary hypertension   Bradycardia   CAD in native artery   Anemia of chronic disease     Subjective   No chest discomfort. Ambulated yesterday. Received blood without difficulty. Slept well last p.m.  Inpatient Medications    Scheduled Meds: . aspirin EC  81 mg Oral Daily  . atorvastatin  40 mg Oral Daily  . clopidogrel  75 mg Oral Daily  . darifenacin  7.5 mg Oral Daily  . hydrALAZINE  100 mg Oral TID  . insulin aspart  0-9 Units Subcutaneous Q4H  . isosorbide mononitrate  60 mg Oral Daily  . latanoprost  1 drop Both Eyes QHS  . mouth rinse  15 mL Mouth Rinse BID  . metoprolol tartrate  25 mg Oral BID  . pantoprazole  40 mg Oral Q0600  . sodium chloride flush  3 mL Intravenous Q12H  . sodium chloride flush  3 mL Intravenous Q12H  . torsemide  20 mg Oral BID  . Warfarin - Pharmacist Dosing Inpatient   Does not apply q1800   Continuous Infusions: . nitroGLYCERIN Stopped (11/29/15 0300)   PRN Meds: sodium chloride, acetaminophen, ALPRAZolam, hydrALAZINE, morphine injection, nitroGLYCERIN, ondansetron **OR** ondansetron (ZOFRAN) IV, sodium chloride flush   Vital Signs    Vitals:   12/04/15 0300 12/04/15 0400 12/04/15 0500 12/04/15 0820  BP: (!) 101/50 (!) 107/58 118/65   Pulse: (!) 53 74 60   Resp: 15 13 20    Temp: 98.3 F (36.8 C)   98.3 F (36.8 C)  TempSrc: Oral   Oral  SpO2: 100% 100% 98%   Weight:      Height:        Intake/Output Summary (Last 24 hours) at 12/04/15 0914 Last data filed at 12/04/15 0228  Gross per 24 hour  Intake              695 ml  Output              375 ml  Net               320 ml   Filed Weights   11/26/15 0537 11/30/15 0327 12/01/15 0407  Weight: 153 lb 4.8 oz (69.5 kg) 157 lb 3 oz (71.3 kg) 161 lb 9.6 oz (73.3 kg)    Physical Exam   Elderly and frail. GEN: Well nourished, well developed, in no acute distress.  HEENT: Grossly normal.  Neck: Supple, no JVD, carotid bruits, or masses. Cardiac: RRR, no murmurs, rubs, or gallops. No clubbing, cyanosis, edema.  Radials/DP/PT 2+ and equal bilaterally.  Respiratory:  Respirations regular and unlabored, clear to auscultation bilaterally. GI: Soft, nontender, nondistended, BS + x 4. MS: no deformity or atrophy. Skin: warm and dry, no rash. Neuro:  Strength and sensation are intact. Decreased hearing. Psych: Flat affect.  Labs    CBC  Recent Labs  12/03/15 0505 12/04/15 0226  WBC 8.8 9.0  HGB 7.1* 8.1*  HCT 21.3* 24.4*  MCV 97.7 95.7  PLT 185 189   Basic Metabolic Panel  Recent Labs  12/03/15 0229 12/04/15 0226  NA 138 137  K 4.5 4.5  CL  109 111  CO2 22 21*  GLUCOSE 115* 81  BUN 33* 34*  CREATININE 1.78* 1.80*  CALCIUM 8.7* 8.9   Liver Function Tests No results for input(s): AST, ALT, ALKPHOS, BILITOT, PROT, ALBUMIN in the last 72 hours. No results for input(s): LIPASE, AMYLASE in the last 72 hours. Cardiac Enzymes No results for input(s): CKTOTAL, CKMB, CKMBINDEX, TROPONINI in the last 72 hours. BNP Invalid input(s): POCBNP D-Dimer No results for input(s): DDIMER in the last 72 hours. Hemoglobin A1C No results for input(s): HGBA1C in the last 72 hours. Fasting Lipid Panel No results for input(s): CHOL, HDL, LDLCALC, TRIG, CHOLHDL, LDLDIRECT in the last 72 hours. Thyroid Function Tests No results for input(s): TSH, T4TOTAL, T3FREE, THYROIDAB in the last 72 hours.  Invalid input(s): FREET3  Telemetry    Atrial fibrillation with controlled rate. - Personally Reviewed  ECG    No new tracing - Personally Reviewed  Radiology    No results found.  Cardiac  Studies   No new data  Patient Profile   82 year old with unstable angina presentation found to have severe calcified complex coronary artery disease. Chronic anemia, atrial fibrillation and kidney disease stage III. Not a surgical candidate. High-risk PCI circumflex roto-stent 12/01/15 associated with distal dissection. Significant LAD and right coronary disease will be treated medically unless recurrent angina. Post stent, developed significant anemia to 6.7. One unit of blood transfused. Hemoglobin up to 8.1 post transfusion.  Assessment & Plan    1. Severe three-vessel coronary disease as outlined above, No recurrence of angina since procedure. 2. Chronic CKD stage IV, stable and back to baseline. 3. Chronic diastolic heart failure, no evidence of volume overload. 4. Severe anemia, hemoglobin now up to 8.1. Plan aspirin, Plavix, and Coumadin for one month then drop aspirin. 5. Chronic atrial fibrillation with rate control. The patient is on chronic anticoagulation. Coumadin has been resumed.  5. Dementia 6. Discussion with family concerning end-of-life planning and consideration  of hospice: after discussion, patient and family want CPR but no extended intubation. Not ready for palliative care. 7. Disposition: Possible DC later today or in AM after discussion with family. 8. Chronic anticoagulation, INR is subtherapeutic. We'll need to arrange close INR follow-up. 9. Medication changes since admission: Diltiazem discontinued. Hydralazine increased to 100 mg 3 times a day. Lisinopril discontinued. K Dur 20 mEq per day from 80 mEq per day. Imdur increased to 60 mg. Aspirin and Plavix added. Metoprolol 25 mg twice a day added. Warfarin, Travatan, Torsemide, Vesicare, Metformin, Amaryl, Atorvastatin (increased to 40 mg), have not been changed. ACE inhibitor therapy can be resumed by Dr. Diona Browner has needed once kidney function is stable.   Signed, Lesleigh Noe, MD  12/04/2015, 9:14 AM

## 2015-12-04 NOTE — Progress Notes (Signed)
ANTICOAGULATION CONSULT NOTE - Follow Up Consult  Pharmacy Consult for Warfarin Indication: atrial fibrillation  Assessment: 82 y.o. female with chest pain/CAD and h/o Afib. S/p cardiac cath 10/19 with 3VCAD (poor CABG candidate).   Warfarin restarted post PCI. Held yesterday due to drop in hgb, which is improved today to 8.1 after transfusion. INR still low at 1.4. No overt bleeding noted, plan is to resume warfarin tonight.   PTA warfarin dose was 5 mg by mouth daily except 2.5 mg by mouth on Wednesdays and Sundays    Goal of Therapy:  INR goal 2-3 Monitor platelets by anticoagulation protocol: Yes   Plan:  Warfarin 7.5mg  tonight Daily INR until stable   No Known Allergies  Patient Measurements: Height: 5\' 6"  (167.6 cm) Weight: 161 lb 9.6 oz (73.3 kg) IBW/kg (Calculated) : 59.3 Heparin Dosing Weight: 69.5 kg  Vital Signs: Temp: 98.3 F (36.8 C) (10/27 0820) Temp Source: Oral (10/27 0820) BP: 118/65 (10/27 0500) Pulse Rate: 60 (10/27 0500)  Labs:  Recent Labs  12/02/15 0244 12/03/15 0229 12/03/15 0505 12/04/15 0226  HGB 7.8* 6.7* 7.1* 8.1*  HCT 23.4* 20.4* 21.3* 24.4*  PLT 205 185 185 189  LABPROT 15.6* 18.0*  --  17.7*  INR 1.23 1.48  --  1.44  CREATININE 1.41* 1.78*  --  1.80*    Estimated Creatinine Clearance: 24.7 mL/min (by C-G formula based on SCr of 1.8 mg/dL (H)).  Sheppard CoilFrank Ashutosh Dieguez PharmD., BCPS Clinical Pharmacist Pager [phone] 12/04/2015 12:02 PM

## 2015-12-04 NOTE — Progress Notes (Signed)
CARDIAC REHAB PHASE I   PRE:  Rate/Rhythm: 62 afib  BP:  Supine:   Sitting: 105/52  Standing:    SaO2: 98%RA  MODE:  Ambulation: 210 ft   POST:  Rate/Rhythm: 103 afib  BP:  Supine:   Sitting: 102/58  Standing:    SaO2: 98%RA 1415-1435 Pt walked 210 ft on RA with rolling walker and minimal asst. Denied CP. No DOE noted. Has rollator at home per family. Pt did not want to walk farther. To recliner with family in room.   Luetta Nuttingharlene Olla Delancey, RN BSN  12/04/2015 2:32 PM

## 2015-12-05 LAB — BASIC METABOLIC PANEL
ANION GAP: 7 (ref 5–15)
BUN: 36 mg/dL — AB (ref 6–20)
CHLORIDE: 108 mmol/L (ref 101–111)
CO2: 23 mmol/L (ref 22–32)
Calcium: 8.9 mg/dL (ref 8.9–10.3)
Creatinine, Ser: 1.9 mg/dL — ABNORMAL HIGH (ref 0.44–1.00)
GFR, EST AFRICAN AMERICAN: 27 mL/min — AB (ref >60)
GFR, EST NON AFRICAN AMERICAN: 23 mL/min — AB (ref >60)
Glucose, Bld: 156 mg/dL — ABNORMAL HIGH (ref 65–99)
POTASSIUM: 4.5 mmol/L (ref 3.5–5.1)
SODIUM: 138 mmol/L (ref 135–145)

## 2015-12-05 LAB — HEMOGLOBIN AND HEMATOCRIT, BLOOD
HEMATOCRIT: 26.1 % — AB (ref 36.0–46.0)
Hemoglobin: 8.7 g/dL — ABNORMAL LOW (ref 12.0–15.0)

## 2015-12-05 LAB — PROTIME-INR
INR: 1.3
Prothrombin Time: 16.3 seconds — ABNORMAL HIGH (ref 11.4–15.2)

## 2015-12-05 LAB — GLUCOSE, CAPILLARY
GLUCOSE-CAPILLARY: 158 mg/dL — AB (ref 65–99)
GLUCOSE-CAPILLARY: 167 mg/dL — AB (ref 65–99)
GLUCOSE-CAPILLARY: 210 mg/dL — AB (ref 65–99)
GLUCOSE-CAPILLARY: 92 mg/dL (ref 65–99)

## 2015-12-05 MED ORDER — PANTOPRAZOLE SODIUM 40 MG PO TBEC: 40.0000 mg | DELAYED_RELEASE_TABLET | Freq: Every day | ORAL | 3 refills | Status: AC

## 2015-12-05 MED ORDER — ISOSORBIDE MONONITRATE ER 60 MG PO TB24: 60.0000 mg | ORAL_TABLET | Freq: Every day | ORAL | 3 refills | Status: AC

## 2015-12-05 MED ORDER — WARFARIN SODIUM 7.5 MG PO TABS: 7.5000 mg | ORAL_TABLET | Freq: Once | ORAL | Status: DC

## 2015-12-05 MED ORDER — HYDRALAZINE HCL 100 MG PO TABS: 100.0000 mg | ORAL_TABLET | Freq: Three times a day (TID) | ORAL | 3 refills | Status: AC

## 2015-12-05 MED ORDER — METOPROLOL TARTRATE 25 MG PO TABS: 12.5000 mg | ORAL_TABLET | Freq: Two times a day (BID) | ORAL | 3 refills | Status: DC

## 2015-12-05 MED ORDER — ACETAMINOPHEN 325 MG PO TABS: 650.0000 mg | ORAL_TABLET | ORAL | Status: AC | PRN

## 2015-12-05 MED ORDER — ATORVASTATIN CALCIUM 40 MG PO TABS: 40.0000 mg | ORAL_TABLET | Freq: Every day | ORAL | 3 refills | Status: AC

## 2015-12-05 MED ORDER — WARFARIN SODIUM 5 MG PO TABS: ORAL_TABLET | ORAL | 3 refills | Status: DC

## 2015-12-05 MED ORDER — METOPROLOL TARTRATE 12.5 MG HALF TABLET: 12.5000 mg | ORAL_TABLET | Freq: Two times a day (BID) | ORAL | Status: DC

## 2015-12-05 MED ORDER — CLOPIDOGREL BISULFATE 75 MG PO TABS: 75.0000 mg | ORAL_TABLET | Freq: Every day | ORAL | 3 refills | Status: AC

## 2015-12-05 MED ORDER — ASPIRIN 81 MG PO TBEC: 81.0000 mg | DELAYED_RELEASE_TABLET | Freq: Every day | ORAL | 0 refills | Status: AC

## 2015-12-05 MED ORDER — NITROGLYCERIN 0.4 MG SL SUBL: 0.4000 mg | SUBLINGUAL_TABLET | SUBLINGUAL | 2 refills | Status: AC | PRN

## 2015-12-05 NOTE — Progress Notes (Addendum)
Patient Name: Melissa [name] Date of Encounter: 12/05/2015  Primary Cardiologist: Nona DellSamuel McDowell, M.D.  Hospital Problem List     Principal Problem:   NSTEMI (non-ST elevated myocardial infarction) [facility](HCC) Active Problems:   Persistent atrial fibrillation (HCC)   Long term current use of anticoagulant   Chronic kidney disease (CKD), stage IV (severe) (HCC)   Pulmonary hypertension   Bradycardia   CAD in native artery   Anemia of chronic disease   Status post coronary artery stent placement     Subjective   No chest discomfort or SOB  Inpatient Medications    Scheduled Meds: . aspirin EC  81 mg Oral Daily  . atorvastatin  40 mg Oral Daily  . clopidogrel  75 mg Oral Daily  . darifenacin  7.5 mg Oral Daily  . hydrALAZINE  100 mg Oral TID  . insulin aspart  0-9 Units Subcutaneous Q4H  . isosorbide mononitrate  60 mg Oral Daily  . latanoprost  1 drop Both Eyes QHS  . mouth rinse  15 mL Mouth Rinse BID  . metoprolol tartrate  25 mg Oral BID  . pantoprazole  40 mg Oral Q0600  . sodium chloride flush  3 mL Intravenous Q12H  . torsemide  20 mg Oral BID  . Warfarin - Pharmacist Dosing Inpatient   Does not apply q1800      PRN Meds: acetaminophen, ALPRAZolam, nitroGLYCERIN, ondansetron **OR** ondansetron (ZOFRAN) IV   Vital Signs    Vitals:   12/04/15 1529 12/04/15 1743 12/04/15 1947 12/05/15 0411  BP: (!) 102/47 (!) 122/46 (!) 106/45 132/60  Pulse: 60 (!) 51 60 71  Resp: 15 18 18 18   Temp: 97.7 F (36.5 C) 97.7 F (36.5 C) 98.2 F (36.8 C) 98.2 F (36.8 C)  TempSrc: Oral Oral Oral Oral  SpO2: 99% 100% 91% 98%  Weight:   167 lb 4.8 oz (75.9 kg) 163 lb 6.4 oz (74.1 kg)  Height:        Intake/Output Summary (Last 24 hours) at 12/05/15 1018 Last data filed at 12/05/15 1010  Gross per 24 hour  Intake              410 ml  Output             2030 ml  Net            -1620 ml   Filed Weights   12/01/15 0407 12/04/15 1947 12/05/15 0411  Weight: 161 lb 9.6  oz (73.3 kg) 167 lb 4.8 oz (75.9 kg) 163 lb 6.4 oz (74.1 kg)    Physical Exam   Elderly and frail. GEN: Well nourished, well developed, in no acute distress.  HEENT: Grossly normal.  Neck: Supple, no JVD, carotid bruits, or masses. Cardiac: RRR, no murmurs, rubs, or gallops. No clubbing, cyanosis, edema.  Radials/DP/PT 2+ and equal bilaterally.  Respiratory:  Respirations regular and unlabored, clear to auscultation bilaterally. GI: Soft, nontender, nondistended, BS + x 4. MS: no deformity or atrophy. Skin: warm and dry, no rash. Neuro:  Strength and sensation are intact. Decreased hearing. Psych: Flat affect.  Labs    CBC  Recent Labs  12/03/15 0505 12/04/15 0226  WBC 8.8 9.0  HGB 7.1* 8.1*  HCT 21.3* 24.4*  MCV 97.7 95.7  PLT 185 189   Basic Metabolic Panel  Recent Labs  12/04/15 0226 12/05/15 0327  NA 137 138  K 4.5 4.5  CL 111 108  CO2 21* 23  GLUCOSE 81 156*  BUN 34* 36*  CREATININE 1.80* 1.90*  CALCIUM 8.9 8.9   Liver Function Tests No results for input(s): AST, ALT, ALKPHOS, BILITOT, PROT, ALBUMIN in the last 72 hours. No results for input(s): LIPASE, AMYLASE in the last 72 hours. Cardiac Enzymes No results for input(s): CKTOTAL, CKMB, CKMBINDEX, TROPONINI in the last 72 hours. BNP Invalid input(s): POCBNP D-Dimer No results for input(s): DDIMER in the last 72 hours. Hemoglobin A1C No results for input(s): HGBA1C in the last 72 hours. Fasting Lipid Panel No results for input(s): CHOL, HDL, LDLCALC, TRIG, CHOLHDL, LDLDIRECT in the last 72 hours. Thyroid Function Tests No results for input(s): TSH, T4TOTAL, T3FREE, THYROIDAB in the last 72 hours.  Invalid input(s): FREET3  Telemetry    Atrial fibrillation with controlled rate. - Personally Reviewed  ECG    No new tracing - Personally Reviewed  Radiology    No results found.  Cardiac Studies   No new data  Patient Profile   82 year old with unstable angina presentation found to have  severe calcified complex coronary artery disease. Chronic anemia, atrial fibrillation and kidney disease stage III. Not a surgical candidate. High-risk PCI circumflex roto-stent 12/01/15 associated with distal dissection. Significant LAD and right coronary disease will be treated medically unless recurrent angina. Post stent, developed significant anemia to 6.7. One unit of blood transfused. Hemoglobin up to 8.1 post transfusion.  Assessment & Plan    1. Severe three-vessel coronary disease as outlined above, No recurrence of angina since procedure. 2. Chronic CKD stage IV, stable and back to baseline. 3. Chronic diastolic heart failure, no evidence of volume overload. 4. Severe anemia, hemoglobin now up to 8.1 yesterday. Plan aspirin, Plavix, and Coumadin for one month then drop aspirin.  Hbg pending this am. 5. Chronic atrial fibrillation with rate control. The patient is on chronic anticoagulation. Coumadin has been resumed. HR in 30's last night so will decrease metoprolol to 12.5mg  BID. 5. Dementia 6. Discussion with family concerning end-of-life planning and consideration  of hospice: after discussion, patient and family want CPR but no extended intubation. Not ready for palliative care. 7. Disposition: D/C home today if Hbg stable this am 8. Chronic anticoagulation, INR is subtherapeutic. We'll need to arrange close INR follow-up. 9. Medication changes since admission: Diltiazem discontinued. Hydralazine increased to 100 mg 3 times a day. Lisinopril discontinued. K Dur 20 mEq per day from 80 mEq per day. Imdur increased to 60 mg. Aspirin and Plavix added. Metoprolol 12.5 mg twice a day added. Warfarin, Travatan, Torsemide, Vesicare, Metformin, Amaryl, Atorvastatin (increased to 40 mg), have not been changed. ACE inhibitor therapy can be resumed by Dr. Diona BrownerMcDowell has needed once kidney function is stable.   Signed, Armanda Magicraci Elihue Ebert, MD  12/05/2015, 10:18 AM

## 2015-12-05 NOTE — Progress Notes (Signed)
ANTICOAGULATION CONSULT NOTE - Follow Up Consult  Pharmacy Consult for Warfarin Indication: atrial fibrillation  Assessment: 82 y.o. female with chest pain/CAD and h/o Afib. S/p cardiac cath 10/19 with 3VCAD (poor CABG candidate).   Warfarin restarted post-PCI. Held 10/26 due to drop in hgb, which is improved at 8.1 after transfusion. INR still low, decreased to 1.3 - likely due to held dose. No overt bleeding noted, warfarin resumed 10/27. Per MD, no bridge with warfarin.  PTA warfarin dose was 5 mg by mouth daily except 2.5 mg by mouth on Wednesdays and Sundays    Goal of Therapy:  INR goal 2-3 Monitor platelets by anticoagulation protocol: Yes   Plan:  -Warfarin 7.5mg  x 1 dose tonight -Daily INR -Monitor CBC, s/sx bleeding -No heparin bridge per MD   No Known Allergies  Patient Measurements: Height: 5\' 6"  (167.6 cm) Weight: 163 lb 6.4 oz (74.1 kg) IBW/kg (Calculated) : 59.3 Heparin Dosing Weight: 69.5 kg  Vital Signs: Temp: 98.2 F (36.8 C) (10/28 0411) Temp Source: Oral (10/28 0411) BP: 132/60 (10/28 0411) Pulse Rate: 71 (10/28 0411)  Labs:  Recent Labs  12/03/15 0229 12/03/15 0505 12/04/15 0226 12/05/15 0327  HGB 6.7* 7.1* 8.1*  --   HCT 20.4* 21.3* 24.4*  --   PLT 185 185 189  --   LABPROT 18.0*  --  17.7* 16.3*  INR 1.48  --  1.44 1.30  CREATININE 1.78*  --  1.80* 1.90*    Estimated Creatinine Clearance: 23.5 mL/min (by C-G formula based on SCr of 1.9 mg/dL (H)).  Babs BertinHaley Winfield Caba, PharmD, BCPS Clinical Pharmacist 12/05/2015 10:50 AM

## 2015-12-05 NOTE — Progress Notes (Signed)
Held Lopressor for H.R> in the 40-50 range At. Fib.

## 2015-12-05 NOTE — Discharge Summary (Signed)
Patient ID: Melissa [name],  MRN: [id number], DOB/AGE: [birth year] 80 y.o.  Admit date: 11/22/2015 Discharge date: 12/05/2015  Primary Care Provider: Fredirick MaudlinHAWKINS,EDWARD L, MD Primary Cardiologist: Dr Diona BrownerMcDowell  Discharge Diagnoses Principal Problem:   NSTEMI (non-ST elevated myocardial infarction) [facility](HCC) Active Problems:   Persistent atrial fibrillation (HCC)   Long term current use of anticoagulant   Chronic kidney disease (CKD), stage IV (severe) (HCC)   Pulmonary hypertension   Bradycardia   CAD S/P high risk PCI 12/01/15   Anemia of chronic disease    Procedures: Coronary angiogram 11/26/15                        CFX PCI 12/01/15   Hospital Course:  82 y/o female with multiple medical problems, including sever pulmonary HTN, CAF, CRI-3/4, DM, HTN with HCVD, and HLD. She was admitted 11/10/15 with chest pain and ruled in for a NSTEMI. The plan was for medical Rx but she had recurrent chest pain and came back to the hospital ([facility]) 11/22/15 and was [facility] for further evaluation and consideration of coronary angiogram. Cath done 11/26/15 showed severe 3V CAD with total mid LAD and 90% proximal CFX and RCA. Previous echo 11/11/15 had shown her EF to be 60-65%. She was seen in consult by Dr Laneta SimmersBartle for consideration of CABG. He was concerned about Ca++ in the aorta on her angiogram and a CT w/o contrast was ordered. This revealed extensive calcific disease in the aorta and he felt she would not be a candidate for CABG. On 12/01/15 she underwent CFX PCI with HSRA and DES. This was complicated by distal vessel dissection and post PCI anemia requiring transfusion. She stabilized and we feel she can be discharged 12/05/15 with plans for TOC f/u in 7 days and will need labs then. She will stop ASA in one month.   Discharge Vitals:  Blood pressure 132/60, pulse 71, temperature 98.2 F (36.8 C), temperature source Oral, resp. rate 18, height 5\' 6"  (1.676 m), weight 163 lb 6.4 oz  (74.1 kg), SpO2 98 %.    Labs: Results for orders placed or performed during the hospital encounter of 11/22/15 (from the past 24 hour(s))  Glucose, capillary     Status: Abnormal   Collection Time: 12/04/15  3:28 PM  Result Value Ref Range   Glucose-Capillary 252 (H) 65 - 99 mg/dL   Comment 1 Capillary Specimen   Glucose, capillary     Status: Abnormal   Collection Time: 12/04/15  8:20 PM  Result Value Ref Range   Glucose-Capillary 186 (H) 65 - 99 mg/dL  Glucose, capillary     Status: None   Collection Time: 12/05/15 12:12 AM  Result Value Ref Range   Glucose-Capillary 92 65 - 99 mg/dL  Protime-INR     Status: Abnormal   Collection Time: 12/05/15  3:27 AM  Result Value Ref Range   Prothrombin Time 16.3 (H) 11.4 - 15.2 seconds   INR 1.30   Basic metabolic panel     Status: Abnormal   Collection Time: 12/05/15  3:27 AM  Result Value Ref Range   Sodium 138 135 - 145 mmol/L   Potassium 4.5 3.5 - 5.1 mmol/L   Chloride 108 101 - 111 mmol/L   CO2 23 22 - 32 mmol/L   Glucose, Bld 156 (H) 65 - 99 mg/dL   BUN 36 (H) 6 - 20 mg/dL   Creatinine, Ser 4.091.90 (H) 0.44 - 1.00  mg/dL   Calcium 8.9 8.9 - 62.110.3 mg/dL   GFR calc non Af Amer 23 (L) >60 mL/min   GFR calc Af Amer 27 (L) >60 mL/min   Anion gap 7 5 - 15  Glucose, capillary     Status: Abnormal   Collection Time: 12/05/15  4:09 AM  Result Value Ref Range   Glucose-Capillary 158 (H) 65 - 99 mg/dL  Glucose, capillary     Status: Abnormal   Collection Time: 12/05/15  9:12 AM  Result Value Ref Range   Glucose-Capillary 167 (H) 65 - 99 mg/dL    Disposition:  Follow-up Information    Nona DellSamuel McDowell, MD .   Specialty:  Cardiology Why:  office will conatct you Contact information: [address] [place] [address] [phone]           Discharge Medications:    Medication List    STOP taking these medications   diltiazem 120 MG 24 hr capsule Commonly known as:  CARDIZEM CD   lisinopril 40 MG tablet Commonly  known as:  PRINIVIL,ZESTRIL   metFORMIN 500 MG tablet Commonly known as:  GLUCOPHAGE   potassium chloride SA 20 MEQ tablet Commonly known as:  K-DUR,KLOR-CON     TAKE these medications   acetaminophen 325 MG tablet Commonly known as:  TYLENOL Take 2 tablets (650 mg total) by mouth every 4 (four) hours as needed for mild pain, moderate pain, fever or headache.   ALPRAZolam 0.25 MG tablet Commonly known as:  XANAX Take 0.25 mg by mouth 3 (three) times daily as needed.   aspirin 81 MG EC tablet Take 1 tablet (81 mg total) by mouth daily. Start taking on:  12/06/2015   atorvastatin 40 MG tablet Commonly known as:  LIPITOR Take 1 tablet (40 mg total) by mouth daily. Start taking on:  12/06/2015 What changed:  medication strength  how much to take   clopidogrel 75 MG tablet Commonly known as:  PLAVIX Take 1 tablet (75 mg total) by mouth daily. Start taking on:  12/06/2015   glimepiride 2 MG tablet Commonly known as:  AMARYL Take 2 mg by mouth daily.   hydrALAZINE 100 MG tablet Commonly known as:  APRESOLINE Take 1 tablet (100 mg total) by mouth 3 (three) times daily. What changed:  medication strength  how much to take   HYDROcodone-acetaminophen 5-325 MG tablet Commonly known as:  NORCO/VICODIN Take 1 tablet by mouth 4 (four) times daily.   isosorbide mononitrate 60 MG 24 hr tablet Commonly known as:  IMDUR Take 1 tablet (60 mg total) by mouth daily. Start taking on:  12/06/2015 What changed:  medication strength  how much to take   metoprolol tartrate 25 MG tablet Commonly known as:  LOPRESSOR Take 0.5 tablets (12.5 mg total) by mouth 2 (two) times daily.   nitroGLYCERIN 0.4 MG SL tablet Commonly known as:  NITROSTAT Place 1 tablet (0.4 mg total) under the tongue every 5 (five) minutes as needed for chest pain.   pantoprazole 40 MG tablet Commonly known as:  PROTONIX Take 1 tablet (40 mg total) by mouth daily at 6 (six) AM. Start taking on:   12/06/2015   solifenacin 5 MG tablet Commonly known as:  VESICARE Take 5 mg by mouth daily.   torsemide 20 MG tablet Commonly known as:  DEMADEX Take 1 tablet (20 mg total) by mouth 2 (two) times daily.   TRAVATAN Z 0.004 % Soln ophthalmic solution Generic drug:  Travoprost (BAK Free) Place  1 drop into both eyes at bedtime.   warfarin 5 MG tablet Commonly known as:  COUMADIN Take one tablet (5 mg) daily except take one-half tablet (2.5 mg) on Wednesdays and Sundays. What changed:  additional instructions        Duration of Discharge Encounter: Greater than 30 minutes including physician time.  Jolene Provost PA-C 12/05/2015 11:51 AM

## 2015-12-05 NOTE — Discharge Instructions (Signed)
Coronary Angiogram With Stent, Care After °Refer to this sheet in the next few weeks. These instructions provide you with information about caring for yourself after your procedure. Your health care provider may also give you more specific instructions. Your treatment has been planned according to current medical practices, but problems sometimes occur. Call your health care provider if you have any problems or questions after your procedure. °WHAT TO EXPECT AFTER THE PROCEDURE  °After your procedure, it is typical to have the following: °· Bruising at the catheter insertion site that usually fades within 1-2 weeks. °· Blood collecting in the tissue (hematoma) that may be painful to the touch. It should usually decrease in size and tenderness within 1-2 weeks. °HOME CARE INSTRUCTIONS °· Take medicines only as directed by your health care provider. Blood thinners may be prescribed after your procedure to improve blood flow through the stent. °· You may shower 24-48 hours after the procedure or as directed by your health care provider. Remove the bandage (dressing) and gently wash the catheter insertion site with plain soap and water. Pat the area dry with a clean towel. Do not rub the site, because this may cause bleeding. °· Do not take baths, swim, or use a hot tub until your health care provider approves. °· Check your catheter insertion site every day for redness, swelling, or drainage. °· Do not apply powder or lotion to the site. °· Do not lift over 10 lb (4.5 kg) for 5 days after your procedure or as directed by your health care provider. °· Ask your health care provider when it is okay to: °¨ Return to work or school. °¨ Resume usual physical activities or sports. °¨ Resume sexual activity. °· Eat a heart-healthy diet. This should include plenty of fresh fruits and vegetables. Meat should be lean cuts. Avoid the following types of food: °¨ Food that is high in salt. °¨ Canned or highly processed food. °¨ Food  that is high in saturated fat or sugar. °¨ Fried food. °· Make any other lifestyle changes as recommended by your health care provider. These may include: °¨ Not using any tobacco products, including cigarettes, chewing tobacco, or electronic cigarettes. If you need help quitting, ask your health care provider. °¨ Managing your weight. °¨ Getting regular exercise. °¨ Managing your blood pressure. °¨ Limiting your alcohol intake. °¨ Managing other health problems, such as diabetes. °· If you need an MRI after your heart stent has been placed, be sure to tell the health care provider who orders the MRI that you have a heart stent. °· Keep all follow-up visits as directed by your health care provider. This is important. °SEEK MEDICAL CARE IF: °· You have a fever. °· You have chills. °· You have increased bleeding from the catheter insertion site. Hold pressure on the site. °SEEK IMMEDIATE MEDICAL CARE IF: °· You develop chest pain or shortness of breath, feel faint, or pass out. °· You have unusual pain at the catheter insertion site. °· You have redness, warmth, or swelling at the catheter insertion site. °· You have drainage (other than a small amount of blood on the dressing) from the catheter insertion site. °· The catheter insertion site is bleeding, and the bleeding does not stop after 30 minutes of holding steady pressure on the site. °· You develop bleeding from any other place, such as from your rectum. There may be bright red blood in your urine or stool, or it may appear as black, tarry stool. °  °  This information is not intended to replace advice given to you by your health care provider. Make sure you discuss any questions you have with your health care provider. °  °Document Released: 08/13/2004 Document Revised: 02/14/2014 Document Reviewed: 06/18/2012 °Elsevier Interactive Patient Education ©2016 Elsevier Inc. ° °

## 2015-12-06 NOTE — Progress Notes (Signed)
Pt/family given discharge instructions, medication lists, follow up appointments, and when to call the doctor.  Pt/family verbalizes understanding. Patient transported to main entrance and daughter took home. Thomas HoffBurton, Ople Girgis McClintock, RN

## 2015-12-08 ENCOUNTER — Telehealth: Payer: Self-pay

## 2015-12-08 NOTE — Telephone Encounter (Signed)
    Dr Diona BrownerMcDowell [place] the [place] in 7 days as a TOC follow up  D/C'd 10/28    [facility].  Patient understands to follow up with provider lenze PA-c [facility]. Patient understands discharge instructions? yes Patient understands medications and regiment? yes Patient understands to bring all medications to this visit? yes I spoke with daughter

## 2015-12-15 NOTE — Progress Notes (Signed)
Cardiology Office Note    Date:  12/16/2015   ID:  Melissa [name], DOB May 12, 1930, MRN [id number]  PCP:  Fredirick MaudlinHAWKINS,EDWARD L, MD  Cardiologist: Dr. Diona BrownerMcDowell  Chief Complaint  Patient presents with  . Follow-up    History of Present Illness:  Melissa [name] is a 82 y.o. female with multiple medical problems, including sever pulmonary HTN, CAF, CRI-3/4, DM, HTN with HCVD, and HLD. She was admitted 11/10/15 with chest pain and ruled in for a NSTEMI. The plan was for medical Rx but she had recurrent chest pain and came back to the hospital ([facility]) 11/22/15 and was [facility] for further evaluation and consideration of coronary angiogram. Cath done 11/26/15 showed severe 3V CAD with total mid LAD and 90% proximal CFX and RCA. Previous echo 11/11/15 had shown her EF to be 60-65%. She was seen in consult by Dr Laneta SimmersBartle for consideration of CABG. He was concerned about Ca++ in the aorta on her angiogram and a CT w/o contrast was ordered. This revealed extensive calcific disease in the aorta and he felt she would not be a candidate for CABG. On 12/01/15 she underwent CFX PCI with HSRA and DES. This was complicated by distal vessel dissection and post PCI anemia requiring transfusion. She stabilized and discharged 12/05/15 with plans for TOC f/u in 7 days and will need labs then. She will stop ASA in one month. Discharge hemoglobin 8.7 creatinine 1.9.  Patient comes in today feeling quite well. She denies any further chest pain, palpitations, dyspnea on exertion, dizziness or presyncope. She has had no bleeding problems or blood in her stools. She actually got out shopping yesterday without difficulty. Home health nurse comes out today.    Past Medical History:  Diagnosis Date  . Atrial fibrillation (HCC)   . CHF (congestive heart failure) (HCC)   . DDD (degenerative disc disease), lumbar   . Essential hypertension, benign   . Mixed hyperlipidemia   . Type 2 diabetes mellitus (HCC)      Past Surgical History:  Procedure Laterality Date  . CARDIAC CATHETERIZATION N/A 11/26/2015   Procedure: Right/Left Heart Cath and Coronary Angiography;  Surgeon: Yvonne Kendallhristopher End, MD;  [facility];  Service: Cardiovascular;  Laterality: N/A;  . CARDIAC CATHETERIZATION N/A 12/01/2015   Procedure: Coronary Stent Intervention Rotoblader;  Surgeon: Tonny BollmanMichael Cooper, MD;  [facility];  Service: Cardiovascular;  Laterality: N/A;  . TOTAL HIP ARTHROPLASTY  01/14/03   Left    Current Medications: Outpatient Medications Prior to Visit  Medication Sig Dispense Refill  . acetaminophen (TYLENOL) 325 MG tablet Take 2 tablets (650 mg total) by mouth every 4 (four) hours as needed for mild pain, moderate pain, fever or headache.    . ALPRAZolam (XANAX) 0.25 MG tablet Take 0.25 mg by mouth 3 (three) times daily as needed.    Marland Kitchen. aspirin EC 81 MG EC tablet Take 1 tablet (81 mg total) by mouth daily. 30 tablet 0  . atorvastatin (LIPITOR) 40 MG tablet Take 1 tablet (40 mg total) by mouth daily. 90 tablet 3  . clopidogrel (PLAVIX) 75 MG tablet Take 1 tablet (75 mg total) by mouth daily. 90 tablet 3  . glimepiride (AMARYL) 2 MG tablet Take 2 mg by mouth daily.      . hydrALAZINE (APRESOLINE) 100 MG tablet Take 1 tablet (100 mg total) by mouth 3 (three) times daily. 270 tablet 3  . HYDROcodone-acetaminophen (NORCO/VICODIN) 5-325 MG tablet Take 1 tablet  by mouth 4 (four) times daily.     . isosorbide mononitrate (IMDUR) 60 MG 24 hr tablet Take 1 tablet (60 mg total) by mouth daily. 90 tablet 3  . metoprolol tartrate (LOPRESSOR) 25 MG tablet Take 0.5 tablets (12.5 mg total) by mouth 2 (two) times daily. 90 tablet 3  . nitroGLYCERIN (NITROSTAT) 0.4 MG SL tablet Place 1 tablet (0.4 mg total) under the tongue every 5 (five) minutes as needed for chest pain. 25 tablet 2  . pantoprazole (PROTONIX) 40 MG tablet Take 1 tablet (40 mg total) by mouth daily at 6 (six) AM. 90 tablet 3  .  solifenacin (VESICARE) 5 MG tablet Take 5 mg by mouth daily.    Marland Kitchen. torsemide (DEMADEX) 20 MG tablet Take 1 tablet (20 mg total) by mouth 2 (two) times daily. 60 tablet 12  . TRAVATAN Z 0.004 % SOLN ophthalmic solution Place 1 drop into both eyes at bedtime.     Marland Kitchen. warfarin (COUMADIN) 5 MG tablet Take one tablet (5 mg) daily except take one-half tablet (2.5 mg) on Wednesdays and Sundays. 45 tablet 3   No facility-administered medications prior to visit.      Allergies:   Patient has no known allergies.   Social History   Social History  . Marital status: Widowed    Spouse name: N/A  . Number of children: N/A  . Years of education: N/A   Occupational History  . Retired Retired   Social History Main Topics  . Smoking status: Never Smoker  . Smokeless tobacco: Never Used  . Alcohol use No  . Drug use: No  . Sexual activity: No   Other Topics Concern  . None   Social History Narrative  . None     Family History:  The patient's family history includes Diabetes type II in her father and mother; Hypertension in her father.   ROS:   Please see the history of present illness.    Review of Systems  Constitution: Positive for weakness and malaise/fatigue.  HENT: Positive for hearing loss.   Eyes: Negative.   Cardiovascular: Positive for leg swelling.  Respiratory: Negative.   Hematologic/Lymphatic: Negative.   Musculoskeletal: Negative.  Negative for joint pain.  Gastrointestinal: Negative.   Genitourinary: Negative.    All other systems reviewed and are negative.   PHYSICAL EXAM:   VS:  BP 140/60   Pulse 75   Ht 5\' 4"  (1.626 m)   Wt 158 lb (71.7 kg)   SpO2 97%   BMI 27.12 kg/m   Physical Exam  GEN: Well nourished, well developed, in no acute distress  Neck: no JVD, carotid bruits, or masses Cardiac:irreg irreg; 2 to 3/6 systolic murmur at the left sternal border, no rub bruit thrill or heave Respiratory:  clear to auscultation bilaterally, normal work of  breathing GI: soft, nontender, nondistended, + BS Ext: Right groin and cath site without hematoma or hemorrhage otherwise without cyanosis, clubbing, , Good distal pulses bilaterally, trace of edema MS: no deformity or atrophy  Skin: warm and dry, no rash Psych: euthymic mood, full affect  Wt Readings from Last 3 Encounters:  12/16/15 158 lb (71.7 kg)  12/05/15 163 lb 6.4 oz (74.1 kg)  11/10/15 143 lb 15.4 oz (65.3 kg)      Studies/Labs Reviewed:   EKG:  EKG is not ordered today.    Recent Labs: 02/17/2015: TSH 3.793 03/02/2015: Magnesium 1.8 11/22/2015: ALT 17 12/04/2015: B Natriuretic Peptide 943.6; Platelets 189 12/05/2015: BUN 36;  Creatinine, Ser 1.90; Hemoglobin 8.7; Potassium 4.5; Sodium 138   Lipid Panel    Component Value Date/Time   CHOL 188 05/10/2011 0930   TRIG 67 05/10/2011 0930   HDL 64 05/10/2011 0930   CHOLHDL 2.9 05/10/2011 0930   VLDL 13 05/10/2011 0930   LDLCALC 111 (H) 05/10/2011 0930    Additional studies/ records that were reviewed today include:  2Decho 11/11/15 Study Conclusions   - Left ventricle: The cavity size was normal. Wall thickness was   increased in a pattern of mild LVH. Systolic function was normal.   The estimated ejection fraction was in the range of 60% to 65%.   There is akinesis of the mid-apicalanteroseptal and apical   myocardium. The study is not technically sufficient to allow   evaluation of LV diastolic function. - Aortic valve: Mildly calcified annulus. Trileaflet; mildly   thickened leaflets. - Mitral valve: Calcified annulus. There was trivial regurgitation. - Left atrium: The atrium was severely dilated. - Right atrium: The atrium was severely dilated. Central venous   pressure (est): 8 mm Hg. - Tricuspid valve: There was moderate regurgitation. - Pulmonary arteries: Systolic pressure was severely increased. PA   peak pressure: 84 mm Hg (S). - Pericardium, extracardiac: A trivial pericardial effusion was    identified posterior to the heart.   Impressions:   - Mild LVH with LVEF 60-65%. There is akinesis of the mid to apical   anteroseptal wall and apex with associated prominent   trabeculation, no obvious LV mural thrombus. Indeterminate   diastolic function. Severe biatrial enlargement. MAC with trivial   mitral regurgitation. Mildly sclerotic aortic valve. Moderate   tricuspid regurgitation with evidence of severe pulmonary   hypertension, PASP estimated 84 mmHg. Trivial posterior   pericardial effusion.   Chest CT 11/27/15: IMPRESSION: Lateral and anterior atherosclerotic calcifications involving the very proximal ascending thoracic aorta. No evidence of thoracic aortic aneurysm.   Cardiomegaly and coronary artery disease.   Bibasilar atelectasis, trace left pleural effusion and possible mild interstitial pulmonary edema.   Right lower lobe nodules, largest measuring 4 mm. No follow-up needed if patient is low-risk (and has no known or suspected primary neoplasm). Non-contrast chest CT can be considered in 12 months if patient is high-risk. This recommendation follows the consensus statement: Guidelines for Management of Incidental Pulmonary Nodules Detected on CT Images: From the Fleischner Society 2017; Radiology 2017; 284:228-243.     Electronically Signed   By: Harmon Pier M.D.   On: 11/27/2015 08:18  PCI 12/01/15 Conclusion  Successful PCI of critical proximal LCx stenosis using rotational atherectomy and a 3.5x12 mm Synergy DES. Procedure complicated by a non-flow limiting distal wire dissection, patient CP-free and hemodynamically stable at procedure completion.   Recommendations:  Medical management of residual CAD, unless recurrent angina  If recurrent angina, PCI of the ostial RCA  Continue ASA and plavix. Start warfarin tonight without heparin bridging\   If PCI of the RCA required, might consider radial access as she has severe aorto-iliac tortuosity  requiring placement of a long 8 Fr sheath.    Cardiac cath 11/26/15 Conclusion  Conclusions: 1.  Severe, heavily calcified three-vessel coronary artery disease (as described below), including chronic total occlusion of the mid LAD, 90% proximal LCx stenosis, and 90% ostial RCA lesion with moderate diffuse disease throughout the remainder of the RCA. 2.  Normal left and right heart filling pressures. 3.  Mild to moderate pulmonary hypertension. 4.  Normal cardiac output/index. 5.  Tortuous and  calcified thoracic and abdominal aorta.   Recommendations: 1.  Transfer to 2 Heart stepdown for aggressive medical therapy including nitroglycerin infusion to manage patient's chest pain. 2.   Cardiac surgery evaluation for CABG. The patient's coronary anatomy and comorbidities make her a poor candidate for percutaneous revascularization. 3.  Restart heparin infusion 8 hours after sheath removal.   Yvonne Kendall, MD [facility] Pager: [phone]       ASSESSMENT:    1. Drug therapy   2. CAD S/P high risk PCI 12/01/15   3. Chronic diastolic congestive heart failure (HCC)   4. Essential hypertension, benign   5. Persistent atrial fibrillation (HCC)   6. Chronic kidney disease (CKD), stage IV (severe) (HCC)      PLAN:  In order of problems listed above:  1 drug therapy will check CBC and BMP today since she is on aspirin and Plavix and Coumadin. To stop the aspirin 01/06/16  2. CAD status post high risk PCI of the circumflex now doing well without angina. Complete continue Plavix and Coumadin and aspirin. Stop aspirin on 01/06/16 follow-up with Dr. Diona Browner in 6 weeks  3. Chronic diastolic CHF compensated  4. Essential hypertension controlled  5. Persistent atrial fibrillation rate controlled with metoprolol, also on Coumadin.  6 CKD will follow-up renal function after recent cath.    Medication Adjustments/Labs and Tests Ordered: Current medicines are reviewed at  length with the patient today.  Concerns regarding medicines are outlined above.  Medication changes, Labs and Tests ordered today are listed in the Patient Instructions below. Patient Instructions  Medication Instructions:  Your physician recommends that you continue on your current medications as directed. Please refer to the Current Medication list given to you today.   Labwork: Your physician recommends that you return for lab work in: Today  CBC BMET  Testing/Procedures: NONE  Follow-Up: Your physician recommends that you schedule a follow-up appointment in: 6 WEEKS    Any Other Special Instructions Will Be Listed Below (If Applicable).     If you need a refill on your cardiac medications before your next appointment, please call your pharmacy.      Elson Clan, PA-C  12/16/2015 11:18 AM    [facility] [address] [place], [place], [place]  [address] Phone: [phone]; Fax: [phone]

## 2015-12-16 ENCOUNTER — Other Ambulatory Visit (HOSPITAL_COMMUNITY)
Admission: RE | Admit: 2015-12-16 | Discharge: 2015-12-16 | Disposition: A | Discharge status: Home / Self Care | Payer: Medicare Other | Source: Ambulatory Visit | Attending: Physician Assistant | Admitting: Physician Assistant

## 2015-12-16 ENCOUNTER — Ambulatory Visit (INDEPENDENT_AMBULATORY_CARE_PROVIDER_SITE_OTHER): Payer: Medicare Other | Admitting: *Deleted

## 2015-12-16 ENCOUNTER — Encounter: Payer: Self-pay | Admitting: Physician Assistant

## 2015-12-16 ENCOUNTER — Ambulatory Visit (INDEPENDENT_AMBULATORY_CARE_PROVIDER_SITE_OTHER): Payer: Medicare Other | Admitting: Physician Assistant

## 2015-12-16 ENCOUNTER — Telehealth: Payer: Self-pay | Admitting: *Deleted

## 2015-12-16 VITALS — BP 140/60 | HR 75 | Ht 64.0 in | Wt 158.0 lb

## 2015-12-16 DIAGNOSIS — I2510 Atherosclerotic heart disease of native coronary artery without angina pectoris: Secondary | ICD-10-CM

## 2015-12-16 DIAGNOSIS — I481 Persistent atrial fibrillation: Secondary | ICD-10-CM

## 2015-12-16 DIAGNOSIS — Z79899 Other long term (current) drug therapy: Secondary | ICD-10-CM | POA: Diagnosis present

## 2015-12-16 DIAGNOSIS — I10 Essential (primary) hypertension: Secondary | ICD-10-CM | POA: Diagnosis not present

## 2015-12-16 DIAGNOSIS — N184 Chronic kidney disease, stage 4 (severe): Secondary | ICD-10-CM

## 2015-12-16 DIAGNOSIS — Z9861 Coronary angioplasty status: Secondary | ICD-10-CM

## 2015-12-16 DIAGNOSIS — Z5181 Encounter for therapeutic drug level monitoring: Secondary | ICD-10-CM | POA: Diagnosis not present

## 2015-12-16 DIAGNOSIS — I4819 Other persistent atrial fibrillation: Secondary | ICD-10-CM

## 2015-12-16 DIAGNOSIS — I5032 Chronic diastolic (congestive) heart failure: Secondary | ICD-10-CM | POA: Diagnosis not present

## 2015-12-16 LAB — BASIC METABOLIC PANEL
Anion gap: 7 (ref 5–15)
BUN: 63 mg/dL — AB (ref 6–20)
CHLORIDE: 106 mmol/L (ref 101–111)
CO2: 26 mmol/L (ref 22–32)
CREATININE: 2.27 mg/dL — AB (ref 0.44–1.00)
Calcium: 9.3 mg/dL (ref 8.9–10.3)
GFR calc Af Amer: 22 mL/min — ABNORMAL LOW (ref >60)
GFR calc non Af Amer: 19 mL/min — ABNORMAL LOW (ref >60)
GLUCOSE: 193 mg/dL — AB (ref 65–99)
POTASSIUM: 4.4 mmol/L (ref 3.5–5.1)
SODIUM: 139 mmol/L (ref 135–145)

## 2015-12-16 LAB — CBC WITH DIFFERENTIAL/PLATELET
Basophils Absolute: 0.1 10*3/uL (ref 0.0–0.1)
Basophils Relative: 1 %
EOS ABS: 0.3 10*3/uL (ref 0.0–0.7)
EOS PCT: 3 %
HCT: 31.8 % — ABNORMAL LOW (ref 36.0–46.0)
Hemoglobin: 10.1 g/dL — ABNORMAL LOW (ref 12.0–15.0)
LYMPHS ABS: 1.5 10*3/uL (ref 0.7–4.0)
LYMPHS PCT: 17 %
MCH: 32.5 pg (ref 26.0–34.0)
MCHC: 31.8 g/dL (ref 30.0–36.0)
MCV: 102.3 fL — AB (ref 78.0–100.0)
MONO ABS: 0.7 10*3/uL (ref 0.1–1.0)
MONOS PCT: 8 %
Neutro Abs: 6.2 10*3/uL (ref 1.7–7.7)
Neutrophils Relative %: 71 %
PLATELETS: 295 10*3/uL (ref 150–400)
RBC: 3.11 MIL/uL — ABNORMAL LOW (ref 3.87–5.11)
RDW: 15.4 % (ref 11.5–15.5)
WBC: 8.7 10*3/uL (ref 4.0–10.5)

## 2015-12-16 LAB — POCT INR: INR: 2

## 2015-12-16 NOTE — Telephone Encounter (Signed)
-----   Message from Dyann KiefMichele M Lenze, PA-C sent at 12/16/2015  2:32 PM EST ----- Kidney function a low worse since cath. Decrease Lasix to 20 mg once daily for 3 days and then back to 20 mg twice a day. Glucose very high at 193 needs follow-up with diabetic doctor. Hemoglobin better. Repeat bmet next week

## 2015-12-16 NOTE — Telephone Encounter (Signed)
-----   Message from Michele M Lenze, PA-C sent at 12/16/2015  2:32 PM EST ----- Kidney function a low worse since cath. Decrease Lasix to 20 mg once daily for 3 days and then back to 20 mg twice a day. Glucose very high at 193 needs follow-up with diabetic doctor. Hemoglobin better. Repeat bmet next week 

## 2015-12-16 NOTE — Telephone Encounter (Signed)
Called patient with test results. No answer. Unable to leave message.  

## 2015-12-16 NOTE — Patient Instructions (Signed)
Medication Instructions:  Your physician recommends that you continue on your current medications as directed. Please refer to the Current Medication list given to you today.   Labwork: Your physician recommends that you return for lab work in: Today  CBC BMET  Testing/Procedures: NONE  Follow-Up: Your physician recommends that you schedule a follow-up appointment in: 6 WEEKS    Any Other Special Instructions Will Be Listed Below (If Applicable).     If you need a refill on your cardiac medications before your next appointment, please call your pharmacy.

## 2015-12-24 ENCOUNTER — Ambulatory Visit (INDEPENDENT_AMBULATORY_CARE_PROVIDER_SITE_OTHER): Payer: Medicare Other

## 2015-12-24 ENCOUNTER — Encounter (HOSPITAL_COMMUNITY)
Admission: RE | Admit: 2015-12-24 | Discharge: 2015-12-24 | Disposition: A | Discharge status: Home / Self Care | Payer: Medicare Other | Source: Ambulatory Visit | Attending: Cardiology | Admitting: Cardiology

## 2015-12-24 VITALS — BP 152/76 | Ht 66.0 in | Wt 157.0 lb

## 2015-12-24 DIAGNOSIS — I4819 Other persistent atrial fibrillation: Secondary | ICD-10-CM

## 2015-12-24 DIAGNOSIS — Z955 Presence of coronary angioplasty implant and graft: Secondary | ICD-10-CM | POA: Diagnosis not present

## 2015-12-24 DIAGNOSIS — I214 Non-ST elevation (NSTEMI) myocardial infarction: Secondary | ICD-10-CM

## 2015-12-24 DIAGNOSIS — Z5181 Encounter for therapeutic drug level monitoring: Secondary | ICD-10-CM

## 2015-12-24 DIAGNOSIS — I481 Persistent atrial fibrillation: Secondary | ICD-10-CM

## 2015-12-24 LAB — BASIC METABOLIC PANEL
BUN: 51 mg/dL — AB (ref 7–25)
CHLORIDE: 108 mmol/L (ref 98–110)
CO2: 21 mmol/L (ref 20–31)
CREATININE: 1.82 mg/dL — AB (ref 0.60–0.88)
Calcium: 9.3 mg/dL (ref 8.6–10.4)
GLUCOSE: 159 mg/dL — AB (ref 65–99)
POTASSIUM: 4.6 mmol/L (ref 3.5–5.3)
Sodium: 143 mmol/L (ref 135–146)

## 2015-12-24 LAB — POCT INR: INR: 1.7

## 2015-12-24 NOTE — Progress Notes (Signed)
Cardiac Individual Treatment Plan  Patient Details  Name: Melissa [name] MRN: [id number] Date of Birth: 11-27-1930 Referring Provider:   Flowsheet Row [facility]ARDIAC REHAB [facility]  Referring Provider  Dr. Diona BrownerMcDowell      Initial Encounter Date:  Flowsheet Row CARDIAC REHAB PHASE II [facility]  Date  12/24/15  Referring Provider  Dr. Diona BrownerMcDowell      Visit Diagnosis: NSTEMI (non-ST elevated myocardial infarction) [facility](HCC)  Status post coronary artery stent placement  Patient's Home Medications on Admission:  Current Outpatient Prescriptions:  .  acetaminophen (TYLENOL) 325 MG tablet, Take 2 tablets (650 mg total) by mouth every 4 (four) hours as needed for mild pain, moderate pain, fever or headache., Disp: , Rfl:  .  ALPRAZolam (XANAX) 0.25 MG tablet, Take 0.25 mg by mouth 3 (three) times daily as needed., Disp: , Rfl:  .  aspirin EC 81 MG EC tablet, Take 1 tablet (81 mg total) by mouth daily., Disp: 30 tablet, Rfl: 0 .  atorvastatin (LIPITOR) 40 MG tablet, Take 1 tablet (40 mg total) by mouth daily., Disp: 90 tablet, Rfl: 3 .  clopidogrel (PLAVIX) 75 MG tablet, Take 1 tablet (75 mg total) by mouth daily., Disp: 90 tablet, Rfl: 3 .  glimepiride (AMARYL) 2 MG tablet, Take 2 mg by mouth daily.  , Disp: , Rfl:  .  hydrALAZINE (APRESOLINE) 100 MG tablet, Take 1 tablet (100 mg total) by mouth 3 (three) times daily., Disp: 270 tablet, Rfl: 3 .  HYDROcodone-acetaminophen (NORCO/VICODIN) 5-325 MG tablet, Take 1 tablet by mouth 4 (four) times daily. , Disp: , Rfl:  .  isosorbide mononitrate (IMDUR) 60 MG 24 hr tablet, Take 1 tablet (60 mg total) by mouth daily., Disp: 90 tablet, Rfl: 3 .  metoprolol tartrate (LOPRESSOR) 25 MG tablet, Take 0.5 tablets (12.5 mg total) by mouth 2 (two) times daily., Disp: 90 tablet, Rfl: 3 .  nitroGLYCERIN (NITROSTAT) 0.4 MG SL tablet, Place 1 tablet  (0.4 mg total) under the tongue every 5 (five) minutes as needed for chest pain., Disp: 25 tablet, Rfl: 2 .  pantoprazole (PROTONIX) 40 MG tablet, Take 1 tablet (40 mg total) by mouth daily at 6 (six) AM., Disp: 90 tablet, Rfl: 3 .  solifenacin (VESICARE) 5 MG tablet, Take 5 mg by mouth daily., Disp: , Rfl:  .  torsemide (DEMADEX) 20 MG tablet, Take 1 tablet (20 mg total) by mouth 2 (two) times daily., Disp: 60 tablet, Rfl: 12 .  TRAVATAN Z 0.004 % SOLN ophthalmic solution, Place 1 drop into both eyes at bedtime. , Disp: , Rfl:  .  warfarin (COUMADIN) 5 MG tablet, Take one tablet (5 mg) daily except take one-half tablet (2.5 mg) on Wednesdays and Sundays. (Patient taking differently: Take 2.5-5 mg by mouth See admin instructions. Take one tablet (5 mg) daily except take one-half tablet (2.5 mg) on Wednesdays and Sundays.), Disp: 45 tablet, Rfl: 3  Past Medical History: Past Medical History:  Diagnosis Date  . Atrial fibrillation (HCC)   . CHF (congestive heart failure) (HCC)   . DDD (degenerative disc disease), lumbar   . Essential hypertension, benign   . Mixed hyperlipidemia   . Type 2 diabetes mellitus (HCC)     Tobacco Use: History  Smoking Status  . Never Smoker  Smokeless Tobacco  . Never Used    Labs: Recent Review Flowsheet Data    Labs for ITP Cardiac and  Pulmonary Rehab Latest Ref Rng & Units 10/05/2010 05/10/2011 09/28/2015 11/26/2015 11/26/2015   Cholestrol 0 - 200 mg/dL 161 096 - - -   LDLCALC 0 - 99 mg/dL 83 045(W) - - -   HDL >09 mg/dL 65 64 - - -   Trlycerides <150 mg/dL 55 67 - - -   Hemoglobin A1c % - - - - -   PHART 7.350 - 7.450 - - - - 7.383   PCO2ART 32.0 - 48.0 mmHg - - - - 43.1   HCO3 20.0 - 28.0 mmol/L - - - 25.1 25.6   TCO2 0 - 100 mmol/L - - 26 26 27    ACIDBASEDEF 0.0 - 2.0 mmol/L - - - - -   O2SAT % - - - 67.0 99.0      Capillary Blood Glucose: Lab Results  Component Value Date   GLUCAP 210 (H) 12/05/2015   GLUCAP 167 (H) 12/05/2015   GLUCAP 158  (H) 12/05/2015   GLUCAP 92 12/05/2015   GLUCAP 186 (H) 12/04/2015     Exercise Target Goals: Date: 12/24/15  Exercise Program Goal: Individual exercise prescription set with THRR, safety & activity barriers. Participant demonstrates ability to understand and report RPE using BORG scale, to self-measure pulse accurately, and to acknowledge the importance of the exercise prescription.  Exercise Prescription Goal: Starting with aerobic activity 30 plus minutes a day, 3 days per week for initial exercise prescription. Provide home exercise prescription and guidelines that participant acknowledges understanding prior to discharge.  Activity Barriers & Risk Stratification:     Activity Barriers & Cardiac Risk Stratification - 12/24/15 0954      Activity Barriers & Cardiac Risk Stratification   Activity Barriers Assistive Device  Uses a cane.   Cardiac Risk Stratification High      6 Minute Walk:     6 Minute Walk    Row Name 12/24/15 1003         6 Minute Walk   Phase Initial     Distance 800 feet     Distance % Change 0 %     Walk Time 6 minutes     # of Rest Breaks 0     MPH 1.51     METS 2.16     RPE 11     Perceived Dyspnea  12     VO2 Peak 7.38     Symptoms No     Resting HR 67 bpm     Resting BP 152/76     Max Ex. HR 124 bpm     Max Ex. BP 180/96     2 Minute Post BP 164/88        Initial Exercise Prescription:     Initial Exercise Prescription - 12/24/15 1000      Date of Initial Exercise RX and Referring Provider   Date 12/24/15   Referring Provider Dr. Diona Browner     NuStep   Level 2   Watts 15   Minutes 15   METs 1.9     Arm Ergometer   Level 1.5   Watts 15   Minutes 20   METs 1.9     Prescription Details   Frequency (times per week) 3   Duration Progress to 30 minutes of continuous aerobic without signs/symptoms of physical distress     Intensity   THRR REST +  30   THRR 40-80% of Max Heartrate [phone]   Ratings of Perceived  Exertion 11-13   Perceived  Dyspnea 0-4     Progression   Progression Continue progressive overload as per policy without signs/symptoms or physical distress.     Resistance Training   Training Prescription Yes   Weight 1   Reps 10-12      Perform Capillary Blood Glucose checks as needed.  Exercise Prescription Changes:   Exercise Comments:    Discharge Exercise Prescription (Final Exercise Prescription Changes):   Nutrition:  Target Goals: Understanding of nutrition guidelines, daily intake of sodium 1500mg , cholesterol 200mg , calories 30% from fat and 7% or less from saturated fats, daily to have 5 or more servings of fruits and vegetables.  Biometrics:     Pre Biometrics - 12/24/15 1005      Pre Biometrics   Height 5\' 6"  (1.676 m)   Weight 156 lb 15.5 oz (71.2 kg)   Waist Circumference 37.5 inches   Hip Circumference 40 inches   Waist to Hip Ratio 0.94 %   BMI (Calculated) 25.4   Triceps Skinfold 5 mm   % Body Fat 30.8 %   Grip Strength 40.66 kg   Flexibility 17.33 in   Single Leg Stand 1 seconds       Nutrition Therapy Plan and Nutrition Goals:   Nutrition Discharge: Rate Your Plate Scores:     Nutrition Assessments - 12/24/15 0955      MEDFICTS Scores   Pre Score 9      Nutrition Goals Re-Evaluation:   Psychosocial: Target Goals: Acknowledge presence or absence of depression, maximize coping skills, provide positive support system. Participant is able to verbalize types and ability to use techniques and skills needed for reducing stress and depression.  Initial Review & Psychosocial Screening:     Initial Psych Review & Screening - 12/24/15 0956      Family Dynamics   Good Support System? Yes     Barriers   Psychosocial barriers to participate in program There are no identifiable barriers or psychosocial needs.     Screening Interventions   Interventions Encouraged to exercise      Quality of Life Scores:     Quality of Life  - 12/24/15 1006      Quality of Life Scores   Health/Function Pre 21.62 %   Socioeconomic Pre 21.4 %   Psych/Spiritual Pre 26.57 %   Family Pre 28.5 %   GLOBAL Pre 23.72 %      PHQ-9: Recent Review Flowsheet Data    [facility]   Decreased Interest 0   Down, Depressed, Hopeless 0   PHQ - 2 Score 0   Altered sleeping 0   Tired, decreased energy 0   Change in appetite 0   Feeling bad or failure about yourself  0   Trouble concentrating 0   Moving slowly or fidgety/restless 0   Suicidal thoughts 0   PHQ-9 Score 0   Difficult doing work/chores Not difficult at all      Psychosocial Evaluation and Intervention:     Psychosocial Evaluation - 12/24/15 0956      Psychosocial Evaluation & Interventions   Interventions Encouraged to exercise with the program and follow exercise prescription   Comments --  No psychosocial issues identified. QOL was 23.72 and PHQ-9 was 0.   Continued Psychosocial Services Needed No      Psychosocial Re-Evaluation:   Vocational Rehabilitation: Provide vocational rehab assistance to qualifying candidates.   Vocational Rehab Evaluation & Intervention:     Vocational Rehab - 12/24/15 [phone]  Initial Vocational Rehab Evaluation & Intervention   Assessment shows need for Vocational Rehabilitation No      Education: Education Goals: Education classes will be provided on a weekly basis, covering required topics. Participant will state understanding/return demonstration of topics presented.  Learning Barriers/Preferences:     Learning Barriers/Preferences - 12/24/15 0954      Learning Barriers/Preferences   Learning Barriers Hearing  HOH   Learning Preferences Written Material;Audio      Education Topics: Hypertension, Hypertension Reduction -Define heart disease and high blood pressure. Discus how high blood pressure affects the body and ways to reduce high blood pressure.   Exercise and Your  Heart -Discuss why it is important to exercise, the FITT principles of exercise, normal and abnormal responses to exercise, and how to exercise safely.   Angina -Discuss definition of angina, causes of angina, treatment of angina, and how to decrease risk of having angina.   Cardiac Medications -Review what the following cardiac medications are used for, how they affect the body, and side effects that may occur when taking the medications.  Medications include Aspirin, Beta blockers, calcium channel blockers, ACE Inhibitors, angiotensin receptor blockers, diuretics, digoxin, and antihyperlipidemics.   Congestive Heart Failure -Discuss the definition of CHF, how to live with CHF, the signs and symptoms of CHF, and how keep track of weight and sodium intake.   Heart Disease and Intimacy -Discus the effect sexual activity has on the heart, how changes occur during intimacy as we age, and safety during sexual activity.   Smoking Cessation / COPD -Discuss different methods to quit smoking, the health benefits of quitting smoking, and the definition of COPD.   Nutrition I: Fats -Discuss the types of cholesterol, what cholesterol does to the heart, and how cholesterol levels can be controlled.   Nutrition II: Labels -Discuss the different components of food labels and how to read food label   Heart Parts and Heart Disease -Discuss the anatomy of the heart, the pathway of blood circulation through the heart, and these are affected by heart disease.   Stress I: Signs and Symptoms -Discuss the causes of stress, how stress may lead to anxiety and depression, and ways to limit stress.   Stress II: Relaxation -Discuss different types of relaxation techniques to limit stress.   Warning Signs of Stroke / TIA -Discuss definition of a stroke, what the signs and symptoms are of a stroke, and how to identify when someone is having stroke.   Knowledge Questionnaire Score:     Knowledge  Questionnaire Score - 12/24/15 0955      Knowledge Questionnaire Score   Pre Score 19/24      Core Components/Risk Factors/Patient Goals at Admission:     Personal Goals and Risk Factors at Admission - 12/24/15 0957      Core Components/Risk Factors/Patient Goals on Admission    Weight Management Weight Maintenance   Sedentary Yes   Intervention Provide advice, education, support and counseling about physical activity/exercise needs.;Develop an individualized exercise prescription for aerobic and resistive training based on initial evaluation findings, risk stratification, comorbidities and participant's personal goals.   Expected Outcomes Achievement of increased cardiorespiratory fitness and enhanced flexibility, muscular endurance and strength shown through measurements of functional capacity and personal statement of participant.   Increase Strength and Stamina Yes   Intervention Provide advice, education, support and counseling about physical activity/exercise needs.;Develop an individualized exercise prescription for aerobic and resistive training based on initial evaluation findings, risk stratification, comorbidities and participant's  personal goals.   Expected Outcomes Achievement of increased cardiorespiratory fitness and enhanced flexibility, muscular endurance and strength shown through measurements of functional capacity and personal statement of participant.   Diabetes Yes   Intervention Provide education about proper nutrition, including hydration, and aerobic/resistive exercise prescription along with prescribed medications to achieve blood glucose in normal ranges: Fasting glucose 65-99 mg/dL   Expected Outcomes Long Term: Attainment of HbA1C < 7%.   Hypertension Yes   Intervention Provide education on lifestyle modifcations including regular physical activity/exercise, weight management, moderate sodium restriction and increased consumption of fresh fruit, vegetables, and  low fat dairy, alcohol moderation, and smoking cessation.   Expected Outcomes Short Term: Continued assessment and intervention until BP is < 140/65mm HG in hypertensive participants. < 130/49mm HG in hypertensive participants with diabetes, heart failure or chronic kidney disease.   Personal Goal Other Yes   Personal Goal Do things she use to do. Get back to cooking.   Intervention Patient will attend cardiac rehab 3 days/week and supplement with exercise 2 days/week at home.   Expected Outcomes Patient will meet her personal goals.       Core Components/Risk Factors/Patient Goals Review:      Goals and Risk Factor Review    Row Name 12/24/15 1001             Core Components/Risk Factors/Patient Goals Review   Personal Goals Review Weight Management/Obesity;Sedentary;Increase Strength and Stamina;Diabetes;Hypertension          Core Components/Risk Factors/Patient Goals at Discharge (Final Review):      Goals and Risk Factor Review - 12/24/15 1001      Core Components/Risk Factors/Patient Goals Review   Personal Goals Review Weight Management/Obesity;Sedentary;Increase Strength and Stamina;Diabetes;Hypertension      ITP Comments:   Comments: Patient arrived for 1st visit/orientation/education at0800. Patient was referred to CR by Dr. Diona Browner  due to NSTEMI (I21.4) and S/P Stent Placement (Z95.5). During orientation advised patient on arrival and appointment times what to wear, what to do before, during and after exercise. Reviewed attendance and class policy. Talked about inclement weather and class consultation policy. Pt is scheduled to return Cardiac Rehab on 12/28/15 at 3:45. Pt was advised to come to class 15 minutes before class starts. Patient was also given instructions on meeting with the dietician and attending the Family Structure classes. Pt is eager to get started. Patient was able to complete 6 minute walk test. Patient was measured for the equipment. Discussed  equipment safety with patient. Took patient pre-anthropometric measurements. Patient finished visit at [phone].

## 2015-12-24 NOTE — Progress Notes (Signed)
Cardiac/Pulmonary Rehab Medication Review by a Pharmacist  Does the patient  feel that his/her medications are working for him/her?  yes  Has the patient been experiencing any side effects to the medications prescribed?  no  Does the patient measure his/her own blood pressure or blood glucose at home?  yes (both) also has nurse visit home for a total of 6 weeks.  Does the patient have any problems obtaining medications due to transportation or finances?   no  Understanding of regimen: excellent Understanding of indications: excellent Potential of compliance: good, uses pill box to morning, noon and evening dose.   Pharmacist comments: Reviewed medications with patient and daughter.  Answered questions if applicable.  No issues noted.  [name], Lyza Houseworth R 12/24/2015 9:29 AM

## 2015-12-28 ENCOUNTER — Encounter (HOSPITAL_COMMUNITY)
Admission: RE | Admit: 2015-12-28 | Discharge: 2015-12-28 | Disposition: A | Discharge status: Home / Self Care | Payer: Medicare Other | Source: Ambulatory Visit | Attending: Cardiology | Admitting: Cardiology

## 2015-12-28 DIAGNOSIS — I214 Non-ST elevation (NSTEMI) myocardial infarction: Secondary | ICD-10-CM | POA: Diagnosis not present

## 2015-12-28 NOTE — Progress Notes (Signed)
Daily Session Note  Patient Details  Name: LYLE LEISNER MRN: [id number] Date of Birth: 03/25/1930 Referring Provider:   Flowsheet Row CARDIAC REHAB PHASE II ORIENTATION from 12/24/2015 in [place]  Referring Provider  Dr. Domenic Polite      Encounter Date: 12/28/2015  Check In:     Session Check In - 12/28/15 1545      Check-In   Location [facility]   Staff Present [name], MS, EP, [facility], Exercise Physiologist;Magaby Rumberger Wynetta Emery, RN, BSN   Supervising physician immediately available to respond to emergencies See telemetry face sheet for immediately available MD   Medication changes reported     No   Fall or balance concerns reported    No   Warm-up and Cool-down Performed as group-led instruction   Resistance Training Performed Yes   VAD Patient? No     Pain Assessment   Currently in Pain? No/denies   Pain Score 0-No pain   Multiple Pain Sites No      Capillary Blood Glucose: No results found for this or any previous visit (from the past 24 hour(s)).   Goals Met:  Independence with exercise equipment Exercise tolerated well No report of cardiac concerns or symptoms Strength training completed today  Goals Unmet:  Not Applicable  Comments: Check out 1645.   Dr. Kate Sable is Medical Director for [facility] Cardiac and Pulmonary Rehab.

## 2015-12-30 ENCOUNTER — Encounter (HOSPITAL_COMMUNITY)
Admission: RE | Admit: 2015-12-30 | Discharge: 2015-12-30 | Disposition: A | Discharge status: Home / Self Care | Payer: Medicare Other | Source: Ambulatory Visit | Attending: Cardiology | Admitting: Cardiology

## 2015-12-30 ENCOUNTER — Telehealth: Payer: Self-pay | Admitting: *Deleted

## 2015-12-30 ENCOUNTER — Ambulatory Visit (INDEPENDENT_AMBULATORY_CARE_PROVIDER_SITE_OTHER): Payer: Medicare Other | Admitting: *Deleted

## 2015-12-30 DIAGNOSIS — I214 Non-ST elevation (NSTEMI) myocardial infarction: Secondary | ICD-10-CM

## 2015-12-30 DIAGNOSIS — I4819 Other persistent atrial fibrillation: Secondary | ICD-10-CM

## 2015-12-30 DIAGNOSIS — Z5181 Encounter for therapeutic drug level monitoring: Secondary | ICD-10-CM

## 2015-12-30 DIAGNOSIS — I481 Persistent atrial fibrillation: Secondary | ICD-10-CM

## 2015-12-30 LAB — POCT INR: INR: 3.2

## 2015-12-30 NOTE — Progress Notes (Signed)
Daily Session Note  Patient Details  Name: Melissa [name] MRN: [id number] Date of Birth: 1930/11/30 Referring Provider:   Flowsheet Row CARDIAC REHAB PHASE II ORIENTATION from 12/24/2015 in [place]  Referring Provider  Dr. Domenic Polite      Encounter Date: 12/30/2015  Check In:     Session Check In - 12/30/15 1545      Check-In   Location [facility]   Staff Present Suzanne Boron, BS, EP, Exercise Physiologist;Tamyra Fojtik Wynetta Emery, RN, BSN   Supervising physician immediately available to respond to emergencies See telemetry face sheet for immediately available MD   Medication changes reported     No   Fall or balance concerns reported    No   Warm-up and Cool-down Performed as group-led instruction   Resistance Training Performed Yes   VAD Patient? No     Pain Assessment   Currently in Pain? No/denies   Pain Score 0-No pain   Multiple Pain Sites No      Capillary Blood Glucose: Results for orders placed or performed in visit on 12/30/15 (from the past 24 hour(s))  POCT INR     Status: None   Collection Time: 12/30/15 12:00 AM  Result Value Ref Range   INR 3.2      Goals Met:  Independence with exercise equipment Exercise tolerated well No report of cardiac concerns or symptoms Strength training completed today  Goals Unmet:  Not Applicable  Comments: Check out 1645.   Dr. Kate Sable is Medical Director for [facility] Cardiac and Pulmonary Rehab.

## 2015-12-30 NOTE — Telephone Encounter (Signed)
Done.  See coumadin note. 

## 2015-12-30 NOTE — Telephone Encounter (Signed)
[phone]

## 2016-01-01 ENCOUNTER — Encounter (HOSPITAL_COMMUNITY): Payer: Medicare Other

## 2016-01-02 ENCOUNTER — Other Ambulatory Visit: Payer: Self-pay | Admitting: Cardiology

## 2016-01-04 ENCOUNTER — Encounter (HOSPITAL_COMMUNITY)
Admission: RE | Admit: 2016-01-04 | Discharge: 2016-01-04 | Disposition: A | Discharge status: Home / Self Care | Payer: Medicare Other | Source: Ambulatory Visit | Attending: Cardiology | Admitting: Cardiology

## 2016-01-04 DIAGNOSIS — Z955 Presence of coronary angioplasty implant and graft: Secondary | ICD-10-CM

## 2016-01-04 DIAGNOSIS — I214 Non-ST elevation (NSTEMI) myocardial infarction: Secondary | ICD-10-CM

## 2016-01-04 NOTE — Progress Notes (Signed)
Daily Session Note  Patient Details  Name: Melissa [name] MRN: [id number] Date of Birth: March 20, 1930 Referring Provider:   Flowsheet Row CARDIAC REHAB PHASE II ORIENTATION from 12/24/2015 in [place]  Referring Provider  Dr. Domenic Polite      Encounter Date: 01/04/2016  Check In:     Session Check In - 01/04/16 1545      Check-In   Location [facility]   Staff Present Karman [place]well Angelina Pih, MS, EP, [facility], Exercise Physiologist;Debra Wynetta Emery, RN, BSN   Supervising physician immediately available to respond to emergencies See telemetry face sheet for immediately available MD   Medication changes reported     No   Fall or balance concerns reported    No   Warm-up and Cool-down Performed as group-led instruction   Resistance Training Performed Yes   VAD Patient? No     Pain Assessment   Currently in Pain? No/denies   Pain Score 0-No pain   Multiple Pain Sites No      Capillary Blood Glucose: No results found for this or any previous visit (from the past 24 hour(s)).   Goals Met:  Independence with exercise equipment Exercise tolerated well No report of cardiac concerns or symptoms Strength training completed today  Goals Unmet:  Not Applicable  Comments: Check out: 4:45   Dr. Kate Sable is Medical Director for [place] and Pulmonary Rehab.

## 2016-01-06 ENCOUNTER — Ambulatory Visit (INDEPENDENT_AMBULATORY_CARE_PROVIDER_SITE_OTHER): Payer: Medicare Other | Admitting: *Deleted

## 2016-01-06 ENCOUNTER — Encounter (HOSPITAL_COMMUNITY)
Admission: RE | Admit: 2016-01-06 | Discharge: 2016-01-06 | Disposition: A | Discharge status: Home / Self Care | Payer: Medicare Other | Source: Ambulatory Visit | Attending: Cardiology | Admitting: Cardiology

## 2016-01-06 DIAGNOSIS — I214 Non-ST elevation (NSTEMI) myocardial infarction: Secondary | ICD-10-CM | POA: Diagnosis not present

## 2016-01-06 DIAGNOSIS — I481 Persistent atrial fibrillation: Secondary | ICD-10-CM

## 2016-01-06 DIAGNOSIS — Z5181 Encounter for therapeutic drug level monitoring: Secondary | ICD-10-CM

## 2016-01-06 DIAGNOSIS — I4819 Other persistent atrial fibrillation: Secondary | ICD-10-CM

## 2016-01-06 LAB — POCT INR: INR: 2.8

## 2016-01-06 NOTE — Progress Notes (Signed)
Daily Session Note  Patient Details  Name: Melissa [name] MRN: [id number] Date of Birth: 1930-11-09 Referring Provider:   Flowsheet Row CARDIAC REHAB PHASE II ORIENTATION from 12/24/2015 in [place]  Referring Provider  Dr. Domenic Polite      Encounter Date: 01/06/2016  Check In:     Session Check In - 01/06/16 1545      Check-In   Location [facility]   Staff Present [name], MS, EP, [facility], Exercise Physiologist;Chanci Ojala Wynetta Emery, RN, BSN   Supervising physician immediately available to respond to emergencies See telemetry face sheet for immediately available MD   Medication changes reported     No   Fall or balance concerns reported    No   Warm-up and Cool-down Performed as group-led instruction   Resistance Training Performed Yes   VAD Patient? No     Pain Assessment   Currently in Pain? No/denies   Pain Score 0-No pain   Multiple Pain Sites No      Capillary Blood Glucose: Results for orders placed or performed in visit on 01/06/16 (from the past 24 hour(s))  POCT INR     Status: None   Collection Time: 01/06/16 12:00 AM  Result Value Ref Range   INR 2.8      Goals Met:  Independence with exercise equipment Exercise tolerated well No report of cardiac concerns or symptoms Strength training completed today  Goals Unmet:  Not Applicable  Comments: Check out 1645.   Dr. Kate Sable is Medical Director for [facility] Cardiac and Pulmonary Rehab.

## 2016-01-08 ENCOUNTER — Encounter (HOSPITAL_COMMUNITY)
Admission: RE | Admit: 2016-01-08 | Discharge: 2016-01-08 | Disposition: A | Discharge status: Home / Self Care | Payer: Medicare Other | Source: Ambulatory Visit | Attending: Cardiology | Admitting: Cardiology

## 2016-01-08 DIAGNOSIS — Z955 Presence of coronary angioplasty implant and graft: Secondary | ICD-10-CM | POA: Diagnosis not present

## 2016-01-08 DIAGNOSIS — I214 Non-ST elevation (NSTEMI) myocardial infarction: Secondary | ICD-10-CM | POA: Diagnosis present

## 2016-01-11 ENCOUNTER — Encounter (HOSPITAL_COMMUNITY)
Admission: RE | Admit: 2016-01-11 | Discharge: 2016-01-11 | Disposition: A | Discharge status: Home / Self Care | Payer: Medicare Other | Source: Ambulatory Visit | Attending: Cardiology | Admitting: Cardiology

## 2016-01-11 DIAGNOSIS — I214 Non-ST elevation (NSTEMI) myocardial infarction: Secondary | ICD-10-CM

## 2016-01-11 DIAGNOSIS — Z955 Presence of coronary angioplasty implant and graft: Secondary | ICD-10-CM

## 2016-01-11 NOTE — Progress Notes (Signed)
Daily Session Note  Patient Details  Name: Melissa [name] MRN: [id number] Date of Birth: 1930-08-30 Referring Provider:   Flowsheet Row CARDIAC REHAB PHASE II ORIENTATION from 12/24/2015 in [place]  Referring Provider  Dr. Domenic Polite      Encounter Date: 01/11/2016  Check In:     Session Check In - 01/11/16 1545      Check-In   Location [facility]   Staff Present Dwyne Hasegawa Angelina Pih, MS, EP, [facility], Exercise Physiologist;Debra Wynetta Emery, RN, BSN   Supervising physician immediately available to respond to emergencies See telemetry face sheet for immediately available MD   Medication changes reported     No   Fall or balance concerns reported    No   Warm-up and Cool-down Performed as group-led instruction   Resistance Training Performed Yes   VAD Patient? No     Pain Assessment   Currently in Pain? No/denies   Pain Score 0-No pain   Multiple Pain Sites No      Capillary Blood Glucose: No results found for this or any previous visit (from the past 24 hour(s)).   Goals Met:  Independence with exercise equipment Exercise tolerated well No report of cardiac concerns or symptoms Strength training completed today  Goals Unmet:  Not Applicable  Comments: Check out: 4:45    Dr. Kate Sable is Medical Director for [place] and Pulmonary Rehab.

## 2016-01-13 ENCOUNTER — Ambulatory Visit (INDEPENDENT_AMBULATORY_CARE_PROVIDER_SITE_OTHER): Payer: Medicare Other | Admitting: *Deleted

## 2016-01-13 ENCOUNTER — Encounter (HOSPITAL_COMMUNITY)
Admission: RE | Admit: 2016-01-13 | Discharge: 2016-01-13 | Disposition: A | Discharge status: Home / Self Care | Payer: Medicare Other | Source: Ambulatory Visit | Attending: Cardiology | Admitting: Cardiology

## 2016-01-13 DIAGNOSIS — I214 Non-ST elevation (NSTEMI) myocardial infarction: Secondary | ICD-10-CM | POA: Diagnosis not present

## 2016-01-13 DIAGNOSIS — Z5181 Encounter for therapeutic drug level monitoring: Secondary | ICD-10-CM | POA: Diagnosis not present

## 2016-01-13 DIAGNOSIS — I4891 Unspecified atrial fibrillation: Secondary | ICD-10-CM

## 2016-01-13 DIAGNOSIS — I481 Persistent atrial fibrillation: Secondary | ICD-10-CM

## 2016-01-13 DIAGNOSIS — I4819 Other persistent atrial fibrillation: Secondary | ICD-10-CM

## 2016-01-13 LAB — POCT INR: INR: 2.2

## 2016-01-13 NOTE — Progress Notes (Signed)
Daily Session Note  Patient Details  Name: GLENNYS SCHORSCH MRN: [id number] Date of Birth: 11-May-1930 Referring Provider:   Flowsheet Row CARDIAC REHAB PHASE II ORIENTATION from 12/24/2015 in [place]  Referring Provider  Dr. Domenic Polite      Encounter Date: 01/13/2016  Check In:     Session Check In - 01/13/16 1545      Check-In   Location [facility]   Staff Present [name], MS, EP, [facility], Exercise Physiologist;Rajvi Armentor Wynetta Emery, RN, BSN   Supervising physician immediately available to respond to emergencies See telemetry face sheet for immediately available MD   Medication changes reported     No   Fall or balance concerns reported    No   Warm-up and Cool-down Performed as group-led instruction   Resistance Training Performed Yes   VAD Patient? No     Pain Assessment   Currently in Pain? No/denies   Pain Score 0-No pain   Multiple Pain Sites No      Capillary Blood Glucose: Results for orders placed or performed in visit on 01/13/16 (from the past 24 hour(s))  POCT INR     Status: Normal   Collection Time: 01/13/16  3:02 PM  Result Value Ref Range   INR 2.2      Goals Met:  Independence with exercise equipment Exercise tolerated well No report of cardiac concerns or symptoms Strength training completed today  Goals Unmet:  Not Applicable  Comments: Check out 1645.   Dr. Kate Sable is Medical Director for [facility] Cardiac and Pulmonary Rehab.

## 2016-01-15 ENCOUNTER — Encounter (HOSPITAL_COMMUNITY): Payer: Medicare Other

## 2016-01-18 ENCOUNTER — Encounter (HOSPITAL_COMMUNITY)
Admission: RE | Admit: 2016-01-18 | Discharge: 2016-01-18 | Disposition: A | Discharge status: Home / Self Care | Payer: Medicare Other | Source: Ambulatory Visit | Attending: Cardiology | Admitting: Cardiology

## 2016-01-18 DIAGNOSIS — I214 Non-ST elevation (NSTEMI) myocardial infarction: Secondary | ICD-10-CM | POA: Diagnosis not present

## 2016-01-18 NOTE — Progress Notes (Signed)
Daily Session Note  Patient Details  Name: Melissa [name] MRN: [id number] Date of Birth: 1930-11-28 Referring Provider:   Flowsheet Row CARDIAC REHAB PHASE II ORIENTATION from 12/24/2015 in [place]  Referring Provider  Dr. Domenic Polite      Encounter Date: 01/18/2016  Check In:     Session Check In - 01/18/16 1545      Check-In   Location [facility]   Staff Present [name], MS, EP, [facility], Exercise Physiologist;[name], RN, BSN   Supervising physician immediately available to respond to emergencies See telemetry face sheet for immediately available MD   Medication changes reported     No   Fall or balance concerns reported    No   Warm-up and Cool-down Performed as group-led instruction   Resistance Training Performed Yes   VAD Patient? No     Pain Assessment   Currently in Pain? No/denies   Pain Score 0-No pain   Multiple Pain Sites No      Capillary Blood Glucose: No results found for this or any previous visit (from the past 24 hour(s)).   Goals Met:  Independence with exercise equipment Exercise tolerated well No report of cardiac concerns or symptoms Strength training completed today  Goals Unmet:  Not Applicable  Comments: Check out 1645.   Dr. Kate Sable is Medical Director for [facility] Cardiac and Pulmonary Rehab.

## 2016-01-20 ENCOUNTER — Encounter (HOSPITAL_COMMUNITY)
Admission: RE | Admit: 2016-01-20 | Discharge: 2016-01-20 | Disposition: A | Discharge status: Home / Self Care | Payer: Medicare Other | Source: Ambulatory Visit | Attending: Cardiology | Admitting: Cardiology

## 2016-01-20 DIAGNOSIS — I214 Non-ST elevation (NSTEMI) myocardial infarction: Secondary | ICD-10-CM | POA: Diagnosis not present

## 2016-01-20 NOTE — Progress Notes (Signed)
Daily Session Note  Patient Details  Name: Melissa [name] MRN: [id number] Date of Birth: 04/12/1930 Referring Provider:   Flowsheet Row CARDIAC REHAB PHASE II ORIENTATION from 12/24/2015 in [place] CARDIAC REHABILITATION  Referring Provider  Dr. McDowell      Encounter Date: 01/20/2016  Check In:     Session Check In - 01/20/16 1545      Check-In   Location [facility]   Staff Present Debra Johnson, RN, BSN;Kirsti Mcalpine, BS, EP, Exercise Physiologist   Supervising physician immediately available to respond to emergencies See telemetry face sheet for immediately available MD   Medication changes reported     No   Fall or balance concerns reported    No   Warm-up and Cool-down Performed as group-led instruction   Resistance Training Performed Yes   VAD Patient? No     Pain Assessment   Currently in Pain? No/denies   Pain Score 0-No pain   Multiple Pain Sites No      Capillary Blood Glucose: No results found for this or any previous visit (from the past 24 hour(s)).   Goals Met:  Independence with exercise equipment Exercise tolerated well No report of cardiac concerns or symptoms Strength training completed today  Goals Unmet:  Not Applicable  Comments: Check out 445   Dr. Suresh Koneswaran is Medical Director for [place] Cardiac and Pulmonary Rehab. 

## 2016-01-20 NOTE — Progress Notes (Signed)
Cardiac Individual Treatment Plan  Patient Details  Name: Melissa [name] MRN: [id number] Date of Birth: April 19, 1930 Referring Provider:   Flowsheet Row CARDIAC REHAB PHASE II ORIENTATION from 12/24/2015 in [facility] CARDIAC REHABILITATION  Referring Provider  Dr. Diona Browner      Initial Encounter Date:  Flowsheet Row CARDIAC REHAB PHASE II ORIENTATION from 12/24/2015 in [place] CARDIAC REHABILITATION  Date  12/24/15  Referring Provider  Dr. Diona Browner      Visit Diagnosis: NSTEMI (non-ST elevated myocardial infarction) [facility])  Patient's Home Medications on Admission:  Current Outpatient Prescriptions:  .  acetaminophen (TYLENOL) 325 MG tablet, Take 2 tablets (650 mg total) by mouth every 4 (four) hours as needed for mild pain, moderate pain, fever or headache., Disp: , Rfl:  .  ALPRAZolam (XANAX) 0.25 MG tablet, Take 0.25 mg by mouth 3 (three) times daily as needed., Disp: , Rfl:  .  atorvastatin (LIPITOR) 40 MG tablet, Take 1 tablet (40 mg total) by mouth daily., Disp: 90 tablet, Rfl: 3 .  clopidogrel (PLAVIX) 75 MG tablet, Take 1 tablet (75 mg total) by mouth daily., Disp: 90 tablet, Rfl: 3 .  glimepiride (AMARYL) 2 MG tablet, Take 2 mg by mouth daily.  , Disp: , Rfl:  .  hydrALAZINE (APRESOLINE) 100 MG tablet, Take 1 tablet (100 mg total) by mouth 3 (three) times daily., Disp: 270 tablet, Rfl: 3 .  HYDROcodone-acetaminophen (NORCO/VICODIN) 5-325 MG tablet, Take 1 tablet by mouth 4 (four) times daily. , Disp: , Rfl:  .  isosorbide mononitrate (IMDUR) 60 MG 24 hr tablet, Take 1 tablet (60 mg total) by mouth daily., Disp: 90 tablet, Rfl: 3 .  metoprolol tartrate (LOPRESSOR) 25 MG tablet, Take 0.5 tablets (12.5 mg total) by mouth 2 (two) times daily., Disp: 90 tablet, Rfl: 3 .  nitroGLYCERIN (NITROSTAT) 0.4 MG SL tablet, Place 1 tablet (0.4 mg total) under the tongue every 5 (five) minutes as needed for chest pain., Disp: 25 tablet, Rfl: 2 .  pantoprazole (PROTONIX) 40 MG tablet,  Take 1 tablet (40 mg total) by mouth daily at 6 (six) AM., Disp: 90 tablet, Rfl: 3 .  solifenacin (VESICARE) 5 MG tablet, Take 5 mg by mouth daily., Disp: , Rfl:  .  torsemide (DEMADEX) 20 MG tablet, Take 1 tablet (20 mg total) by mouth 2 (two) times daily., Disp: 60 tablet, Rfl: 12 .  TRAVATAN Z 0.004 % SOLN ophthalmic solution, Place 1 drop into both eyes at bedtime. , Disp: , Rfl:  .  warfarin (COUMADIN) 5 MG tablet, Take 1 tablet daily except 1/2 tablet on Wednesdays and Saturdays or as directed, Disp: 30 tablet, Rfl: 3  Past Medical History: Past Medical History:  Diagnosis Date  . Atrial fibrillation (HCC)   . CHF (congestive heart failure) (HCC)   . DDD (degenerative disc disease), lumbar   . Essential hypertension, benign   . Mixed hyperlipidemia   . Type 2 diabetes mellitus (HCC)     Tobacco Use: History  Smoking Status  . Never Smoker  Smokeless Tobacco  . Never Used    Labs: Recent Review Flowsheet Data    Labs for ITP Cardiac and Pulmonary Rehab Latest Ref Rng & Units 10/05/2010 05/10/2011 09/28/2015 11/26/2015 11/26/2015   Cholestrol 0 - 200 mg/dL 782 956 - - -   LDLCALC 0 - 99 mg/dL 83 213(Y) - - -   HDL >86 mg/dL 65 64 - - -   Trlycerides <150 mg/dL 55 67 - - -  Hemoglobin A1c % - - - - -   PHART 7.350 - 7.450 - - - - 7.383   PCO2ART 32.0 - 48.0 mmHg - - - - 43.1   HCO3 20.0 - 28.0 mmol/L - - - 25.1 25.6   TCO2 0 - 100 mmol/L - - 26 26 27    ACIDBASEDEF 0.0 - 2.0 mmol/L - - - - -   O2SAT % - - - 67.0 99.0      Capillary Blood Glucose: Lab Results  Component Value Date   GLUCAP 210 (H) 12/05/2015   GLUCAP 167 (H) 12/05/2015   GLUCAP 158 (H) 12/05/2015   GLUCAP 92 12/05/2015   GLUCAP 186 (H) 12/04/2015     Exercise Target Goals:    Exercise Program Goal: Individual exercise prescription set with THRR, safety & activity barriers. Participant demonstrates ability to understand and report RPE using BORG scale, to self-measure pulse accurately, and to  acknowledge the importance of the exercise prescription.  Exercise Prescription Goal: Starting with aerobic activity 30 plus minutes a day, 3 days per week for initial exercise prescription. Provide home exercise prescription and guidelines that participant acknowledges understanding prior to discharge.  Activity Barriers & Risk Stratification:     Activity Barriers & Cardiac Risk Stratification - 12/24/15 0954      Activity Barriers & Cardiac Risk Stratification   Activity Barriers Assistive Device  Uses a cane.   Cardiac Risk Stratification High      6 Minute Walk:     6 Minute Walk    Row Name 12/24/15 1003         6 Minute Walk   Phase Initial     Distance 800 feet     Distance % Change 0 %     Walk Time 6 minutes     # of Rest Breaks 0     MPH 1.51     METS 2.16     RPE 11     Perceived Dyspnea  12     VO2 Peak 7.38     Symptoms No     Resting HR 67 bpm     Resting BP 152/76     Max Ex. HR 124 bpm     Max Ex. BP 180/96     2 Minute Post BP 164/88        Initial Exercise Prescription:     Initial Exercise Prescription - 12/24/15 1000      Date of Initial Exercise RX and Referring Provider   Date 12/24/15   Referring Provider Dr. Diona BrownerMcDowell     NuStep   Level 2   Watts 15   Minutes 15   METs 1.9     Arm Ergometer   Level 1.5   Watts 15   Minutes 20   METs 1.9     Prescription Details   Frequency (times per week) 3   Duration Progress to 30 minutes of continuous aerobic without signs/symptoms of physical distress     Intensity   THRR REST +  30   THRR 40-80% of Max Heartrate [phone]   Ratings of Perceived Exertion 11-13   Perceived Dyspnea 0-4     Progression   Progression Continue progressive overload as per policy without signs/symptoms or physical distress.     Resistance Training   Training Prescription Yes   Weight 1   Reps 10-12      Perform Capillary Blood Glucose checks as needed.  Exercise Prescription Changes:  Exercise Prescription Changes    Row Name 01/19/16 1100             Exercise Review   Progression No         Response to Exercise   Blood Pressure (Admit) 140/50       Blood Pressure (Exercise) 140/50       Blood Pressure (Exit) 150/70       Heart Rate (Admit) 51 bpm       Heart Rate (Exercise) 70 bpm       Heart Rate (Exit) 64 bpm       Rating of Perceived Exertion (Exercise) 12       Duration Progress to 30 minutes of continuous aerobic without signs/symptoms of physical distress       Intensity Rest + 30         Progression   Progression Continue progressive overload as per policy without signs/symptoms or physical distress.         Resistance Training   Training Prescription Yes       Weight 1       Reps 10-12         NuStep   Level 2       Watts 8       Minutes 15       METs 1.9         Arm Ergometer   Level 1.5       Watts 15       Minutes 20       METs 1.9         Home Exercise Plan   Plans to continue exercise at Home       Frequency Add 2 additional days to program exercise sessions.          Exercise Comments:      Exercise Comments    Row Name 01/19/16 1136           Exercise Comments Patient is being slowly proggressed, Her age and hearing disabilities are making the progress longer.            Discharge Exercise Prescription (Final Exercise Prescription Changes):     Exercise Prescription Changes - 01/19/16 1100      Exercise Review   Progression No     Response to Exercise   Blood Pressure (Admit) 140/50   Blood Pressure (Exercise) 140/50   Blood Pressure (Exit) 150/70   Heart Rate (Admit) 51 bpm   Heart Rate (Exercise) 70 bpm   Heart Rate (Exit) 64 bpm   Rating of Perceived Exertion (Exercise) 12   Duration Progress to 30 minutes of continuous aerobic without signs/symptoms of physical distress   Intensity Rest + 30     Progression   Progression Continue progressive overload as per policy without signs/symptoms or  physical distress.     Resistance Training   Training Prescription Yes   Weight 1   Reps 10-12     NuStep   Level 2   Watts 8   Minutes 15   METs 1.9     Arm Ergometer   Level 1.5   Watts 15   Minutes 20   METs 1.9     Home Exercise Plan   Plans to continue exercise at Home   Frequency Add 2 additional days to program exercise sessions.      Nutrition:  Target Goals: Understanding of nutrition guidelines, daily intake of sodium 1500mg , cholesterol 200mg , calories 30% from fat  and 7% or less from saturated fats, daily to have 5 or more servings of fruits and vegetables.  Biometrics:     Pre Biometrics - 12/24/15 1005      Pre Biometrics   Height 5\' 6"  (1.676 m)   Weight 156 lb 15.5 oz (71.2 kg)   Waist Circumference 37.5 inches   Hip Circumference 40 inches   Waist to Hip Ratio 0.94 %   BMI (Calculated) 25.4   Triceps Skinfold 5 mm   % Body Fat 30.8 %   Grip Strength 40.66 kg   Flexibility 17.33 in   Single Leg Stand 1 seconds       Nutrition Therapy Plan and Nutrition Goals:   Nutrition Discharge: Rate Your Plate Scores:     Nutrition Assessments - 12/24/15 0955      MEDFICTS Scores   Pre Score 9      Nutrition Goals Re-Evaluation:   Psychosocial: Target Goals: Acknowledge presence or absence of depression, maximize coping skills, provide positive support system. Participant is able to verbalize types and ability to use techniques and skills needed for reducing stress and depression.  Initial Review & Psychosocial Screening:     Initial Psych Review & Screening - 12/24/15 0956      Family Dynamics   Good Support System? Yes     Barriers   Psychosocial barriers to participate in program There are no identifiable barriers or psychosocial needs.     Screening Interventions   Interventions Encouraged to exercise      Quality of Life Scores:     Quality of Life - 12/24/15 1006      Quality of Life Scores   Health/Function Pre  21.62 %   Socioeconomic Pre 21.4 %   Psych/Spiritual Pre 26.57 %   Family Pre 28.5 %   GLOBAL Pre 23.72 %      PHQ-9: Recent Review Flowsheet Data    Depression screen [facility] 2/9 12/24/2015   Decreased Interest 0   Down, Depressed, Hopeless 0   PHQ - 2 Score 0   Altered sleeping 0   Tired, decreased energy 0   Change in appetite 0   Feeling bad or failure about yourself  0   Trouble concentrating 0   Moving slowly or fidgety/restless 0   Suicidal thoughts 0   PHQ-9 Score 0   Difficult doing work/chores Not difficult at all      Psychosocial Evaluation and Intervention:     Psychosocial Evaluation - 12/24/15 0956      Psychosocial Evaluation & Interventions   Interventions Encouraged to exercise with the program and follow exercise prescription   Comments --  No psychosocial issues identified. QOL was 23.72 and PHQ-9 was 0.   Continued Psychosocial Services Needed No      Psychosocial Re-Evaluation:     Psychosocial Re-Evaluation    Row Name 01/20/16 1333             Psychosocial Re-Evaluation   Interventions Encouraged to attend Cardiac Rehabilitation for the exercise       Comments Patient continues to have no psychosocial issues identified.        Continued Psychosocial Services Needed No          Vocational Rehabilitation: Provide vocational rehab assistance to qualifying candidates.   Vocational Rehab Evaluation & Intervention:     Vocational Rehab - 12/24/15 0955      Initial Vocational Rehab Evaluation & Intervention   Assessment shows need for Vocational Rehabilitation No  Education: Education Goals: Education classes will be provided on a weekly basis, covering required topics. Participant will state understanding/return demonstration of topics presented.  Learning Barriers/Preferences:     Learning Barriers/Preferences - 12/24/15 0954      Learning Barriers/Preferences   Learning Barriers Hearing  HOH   Learning Preferences  Written Material;Audio      Education Topics: Hypertension, Hypertension Reduction -Define heart disease and high blood pressure. Discus how high blood pressure affects the body and ways to reduce high blood pressure.   Exercise and Your Heart -Discuss why it is important to exercise, the FITT principles of exercise, normal and abnormal responses to exercise, and how to exercise safely.   Angina -Discuss definition of angina, causes of angina, treatment of angina, and how to decrease risk of having angina.   Cardiac Medications -Review what the following cardiac medications are used for, how they affect the body, and side effects that may occur when taking the medications.  Medications include Aspirin, Beta blockers, calcium channel blockers, ACE Inhibitors, angiotensin receptor blockers, diuretics, digoxin, and antihyperlipidemics. Flowsheet Row CARDIAC REHAB PHASE II EXERCISE from 01/13/2016 in [place] CARDIAC REHABILITATION  Date  12/30/15  Educator  Jacelyn Grip  Instruction Review Code  2- meets goals/outcomes      Congestive Heart Failure -Discuss the definition of CHF, how to live with CHF, the signs and symptoms of CHF, and how keep track of weight and sodium intake. Flowsheet Row CARDIAC REHAB PHASE II EXERCISE from 01/13/2016 in [place] CARDIAC REHABILITATION  Date  01/06/16  Educator  Jae Dire  Instruction Review Code  2- meets goals/outcomes      Heart Disease and Intimacy -Discus the effect sexual activity has on the heart, how changes occur during intimacy as we age, and safety during sexual activity. Flowsheet Row CARDIAC REHAB PHASE II EXERCISE from 01/13/2016 in [place] CARDIAC REHABILITATION  Date  01/13/16  Educator  Diane coad  Instruction Review Code  2- meets goals/outcomes      Smoking Cessation / COPD -Discuss different methods to quit smoking, the health benefits of quitting smoking, and the definition of COPD.   Nutrition I:  Fats -Discuss the types of cholesterol, what cholesterol does to the heart, and how cholesterol levels can be controlled.   Nutrition II: Labels -Discuss the different components of food labels and how to read food label   Heart Parts and Heart Disease -Discuss the anatomy of the heart, the pathway of blood circulation through the heart, and these are affected by heart disease.   Stress I: Signs and Symptoms -Discuss the causes of stress, how stress may lead to anxiety and depression, and ways to limit stress.   Stress II: Relaxation -Discuss different types of relaxation techniques to limit stress.   Warning Signs of Stroke / TIA -Discuss definition of a stroke, what the signs and symptoms are of a stroke, and how to identify when someone is having stroke.   Knowledge Questionnaire Score:     Knowledge Questionnaire Score - 12/24/15 0955      Knowledge Questionnaire Score   Pre Score 19/24      Core Components/Risk Factors/Patient Goals at Admission:     Personal Goals and Risk Factors at Admission - 12/24/15 0957      Core Components/Risk Factors/Patient Goals on Admission    Weight Management Weight Maintenance   Sedentary Yes   Intervention Provide advice, education, support and counseling about physical activity/exercise needs.;Develop an individualized exercise prescription for  aerobic and resistive training based on initial evaluation findings, risk stratification, comorbidities and participant's personal goals.   Expected Outcomes Achievement of increased cardiorespiratory fitness and enhanced flexibility, muscular endurance and strength shown through measurements of functional capacity and personal statement of participant.   Increase Strength and Stamina Yes   Intervention Provide advice, education, support and counseling about physical activity/exercise needs.;Develop an individualized exercise prescription for aerobic and resistive training based on initial  evaluation findings, risk stratification, comorbidities and participant's personal goals.   Expected Outcomes Achievement of increased cardiorespiratory fitness and enhanced flexibility, muscular endurance and strength shown through measurements of functional capacity and personal statement of participant.   Diabetes Yes   Intervention Provide education about proper nutrition, including hydration, and aerobic/resistive exercise prescription along with prescribed medications to achieve blood glucose in normal ranges: Fasting glucose 65-99 mg/dL   Expected Outcomes Long Term: Attainment of HbA1C < 7%.   Hypertension Yes   Intervention Provide education on lifestyle modifcations including regular physical activity/exercise, weight management, moderate sodium restriction and increased consumption of fresh fruit, vegetables, and low fat dairy, alcohol moderation, and smoking cessation.   Expected Outcomes Short Term: Continued assessment and intervention until BP is < 140/59mm HG in hypertensive participants. < 130/61mm HG in hypertensive participants with diabetes, heart failure or chronic kidney disease.   Personal Goal Other Yes   Personal Goal Do things she use to do. Get back to cooking.   Intervention Patient will attend cardiac rehab 3 days/week and supplement with exercise 2 days/week at home.   Expected Outcomes Patient will meet her personal goals.       Core Components/Risk Factors/Patient Goals Review:      Goals and Risk Factor Review    Row Name 12/24/15 1001 01/20/16 1331           Core Components/Risk Factors/Patient Goals Review   Personal Goals Review Weight Management/Obesity;Sedentary;Increase Strength and Stamina;Diabetes;Hypertension Increase Strength and Stamina;Weight Management/Obesity;Hypertension;Diabetes  Do things she used to do. Get back to cooking.       Review  - Patient has attended 9 sessions maintaining her weight. She has progressed some since starting. Will  continue to monitor for progress.       Expected Outcomes  - Patient will continue to attend sessions meeting her personal goals.          Core Components/Risk Factors/Patient Goals at Discharge (Final Review):      Goals and Risk Factor Review - 01/20/16 1331      Core Components/Risk Factors/Patient Goals Review   Personal Goals Review Increase Strength and Stamina;Weight Management/Obesity;Hypertension;Diabetes  Do things she used to do. Get back to cooking.    Review Patient has attended 9 sessions maintaining her weight. She has progressed some since starting. Will continue to monitor for progress.    Expected Outcomes Patient will continue to attend sessions meeting her personal goals.       ITP Comments:   Comments: ITP 30 Day REVIEW Patient doing well in the program. Will continue to monitor for progress.

## 2016-01-22 ENCOUNTER — Encounter (HOSPITAL_COMMUNITY)
Admission: RE | Admit: 2016-01-22 | Discharge: 2016-01-22 | Disposition: A | Discharge status: Home / Self Care | Payer: Medicare Other | Source: Ambulatory Visit | Attending: Cardiology | Admitting: Cardiology

## 2016-01-22 DIAGNOSIS — I214 Non-ST elevation (NSTEMI) myocardial infarction: Secondary | ICD-10-CM | POA: Diagnosis not present

## 2016-01-22 NOTE — Progress Notes (Signed)
Daily Session Note  Patient Details  Name: Melissa [name] MRN: [id number] Date of Birth: 1930/07/31 Referring Provider:   Flowsheet Row CARDIAC REHAB PHASE II ORIENTATION from 12/24/2015 in [place]  Referring Provider  Dr. Domenic Polite      Encounter Date: 01/22/2016  Check In:     Session Check In - 01/22/16 1542      Check-In   Location [facility]   Staff Present Suzanne Boron, BS, EP, Exercise Physiologist;Eshani Maestre Wynetta Emery, RN, BSN   Supervising physician immediately available to respond to emergencies See telemetry face sheet for immediately available MD   Medication changes reported     No   Fall or balance concerns reported    No   Warm-up and Cool-down Performed as group-led instruction   Resistance Training Performed Yes   VAD Patient? No     Pain Assessment   Currently in Pain? No/denies   Pain Score 0-No pain   Multiple Pain Sites No      Capillary Blood Glucose: No results found for this or any previous visit (from the past 24 hour(s)).   Goals Met:  Independence with exercise equipment Exercise tolerated well No report of cardiac concerns or symptoms Strength training completed today  Goals Unmet:  Not Applicable  Comments: Check out 1645.   Dr. Kate Sable is Medical Director for [facility] Cardiac and Pulmonary Rehab.

## 2016-01-25 ENCOUNTER — Encounter (HOSPITAL_COMMUNITY)
Admission: RE | Admit: 2016-01-25 | Discharge: 2016-01-25 | Disposition: A | Discharge status: Home / Self Care | Payer: Medicare Other | Source: Ambulatory Visit | Attending: Cardiology | Admitting: Cardiology

## 2016-01-25 DIAGNOSIS — I214 Non-ST elevation (NSTEMI) myocardial infarction: Secondary | ICD-10-CM

## 2016-01-25 DIAGNOSIS — Z955 Presence of coronary angioplasty implant and graft: Secondary | ICD-10-CM

## 2016-01-25 NOTE — Progress Notes (Signed)
Daily Session Note  Patient Details  Name: Melissa [name] MRN: [id number] Date of Birth: 04-04-1930 Referring Provider:   Flowsheet Row CARDIAC REHAB PHASE II ORIENTATION from 12/24/2015 in [place]  Referring Provider  Dr. Domenic Polite      Encounter Date: 01/25/2016  Check In:     Session Check In - 01/25/16 1545      Check-In   Location [facility]   Staff Present Genese Quebedeaux Angelina Pih, MS, EP, [facility], Exercise Physiologist;Debra Wynetta Emery, RN, BSN   Supervising physician immediately available to respond to emergencies See telemetry face sheet for immediately available MD   Medication changes reported     No   Fall or balance concerns reported    No   Warm-up and Cool-down Performed as group-led instruction   Resistance Training Performed Yes   VAD Patient? No     Pain Assessment   Currently in Pain? No/denies   Pain Score 0-No pain   Multiple Pain Sites No      Capillary Blood Glucose: No results found for this or any previous visit (from the past 24 hour(s)).   Goals Met:  Independence with exercise equipment Exercise tolerated well Personal goals reviewed No report of cardiac concerns or symptoms Strength training completed today  Goals Unmet:  Not Applicable  Comments: Check out 4:45   Dr. Kate Sable is Medical Director for [place] and Pulmonary Rehab.

## 2016-01-27 ENCOUNTER — Encounter (HOSPITAL_COMMUNITY)
Admission: RE | Admit: 2016-01-27 | Discharge: 2016-01-27 | Disposition: A | Discharge status: Home / Self Care | Payer: Medicare Other | Source: Ambulatory Visit | Attending: Cardiology | Admitting: Cardiology

## 2016-01-27 DIAGNOSIS — I214 Non-ST elevation (NSTEMI) myocardial infarction: Secondary | ICD-10-CM

## 2016-01-27 NOTE — Progress Notes (Signed)
Daily Session Note  Patient Details  Name: ARMETTA HENRI MRN: [id number] Date of Birth: [birth year] Referring Provider:   Flowsheet Row CARDIAC REHAB PHASE II ORIENTATION from 12/24/2015 in [place]  Referring Provider  Dr. Domenic Polite      Encounter Date: 01/27/2016  Check In:     Session Check In - 01/27/16 1545      Check-In   Location [facility]   Staff Present [name], MS, EP, [facility], Exercise Physiologist;Darcell Yacoub Wynetta Emery, RN, BSN   Supervising physician immediately available to respond to emergencies See telemetry face sheet for immediately available MD   Medication changes reported     No   Fall or balance concerns reported    No   Warm-up and Cool-down Performed as group-led instruction   Resistance Training Performed Yes   VAD Patient? No     Pain Assessment   Currently in Pain? No/denies   Pain Score 0-No pain   Multiple Pain Sites No      Capillary Blood Glucose: No results found for this or any previous visit (from the past 24 hour(s)).   Goals Met:  Independence with exercise equipment Exercise tolerated well No report of cardiac concerns or symptoms Strength training completed today  Goals Unmet:  Not Applicable  Comments: Check out 1645.   Dr. Kate Sable is Medical Director for [facility] Cardiac and Pulmonary Rehab.

## 2016-01-28 ENCOUNTER — Ambulatory Visit (INDEPENDENT_AMBULATORY_CARE_PROVIDER_SITE_OTHER): Payer: Medicare Other | Admitting: Cardiology

## 2016-01-28 ENCOUNTER — Encounter: Payer: Self-pay | Admitting: Cardiology

## 2016-01-28 VITALS — BP 150/80 | HR 54 | Ht 64.0 in | Wt 157.0 lb

## 2016-01-28 DIAGNOSIS — E782 Mixed hyperlipidemia: Secondary | ICD-10-CM

## 2016-01-28 DIAGNOSIS — I482 Chronic atrial fibrillation, unspecified: Secondary | ICD-10-CM

## 2016-01-28 DIAGNOSIS — N183 Chronic kidney disease, stage 3 unspecified: Secondary | ICD-10-CM

## 2016-01-28 DIAGNOSIS — I2510 Atherosclerotic heart disease of native coronary artery without angina pectoris: Secondary | ICD-10-CM

## 2016-01-28 NOTE — Progress Notes (Signed)
Cardiology Office Note  Date: 01/28/2016   ID: Melissa [name], DOB 24-Dec-1930, MRN [id number]  PCP: Fredirick Maudlin, MD  Primary Cardiologist: Nona Dell, MD   Chief Complaint  Patient presents with  . Coronary Artery Disease  . Atrial Fibrillation    History of Present Illness: Melissa [name] is an 82 y.o. female last seen in November by Ms. Geni Bers PA-C. I reviewed her history. She is here today with a family member, overall doing well and still enjoying cardiac rehabilitation. She does not report any chest pain or palpitations. She did stop aspirin as directed since the last encounter, and remains on Plavix and Coumadin.  Reviewed her medications which are outlined below.  Labwork is also reviewed showing improvement in creatinine from 2.2 down to 1.8. Hemoglobin up from 8.7-10.1 as well.  Past Medical History:  Diagnosis Date  . Atrial fibrillation (HCC)   . CAD (coronary artery disease)    Multivessel status post high risk PCI/DES to circumflex 11/2015 (poor candidate for CABG)  . CKD (chronic kidney disease) stage 3, GFR 30-59 ml/min   . DDD (degenerative disc disease), lumbar   . Essential hypertension, benign   . Mixed hyperlipidemia   . Type 2 diabetes mellitus (HCC)     Past Surgical History:  Procedure Laterality Date  . CARDIAC CATHETERIZATION N/A 11/26/2015   Procedure: Right/Left Heart Cath and Coronary Angiography;  Surgeon: Yvonne Kendall, MD;  Location: [facility] INVASIVE CV LAB;  Service: Cardiovascular;  Laterality: N/A;  . CARDIAC CATHETERIZATION N/A 12/01/2015   Procedure: Coronary Stent Intervention Rotoblader;  Surgeon: Tonny Bollman, MD;  Location: [facility] INVASIVE CV LAB;  Service: Cardiovascular;  Laterality: N/A;  . TOTAL HIP ARTHROPLASTY  01/14/03   Left    Current Outpatient Prescriptions  Medication Sig Dispense Refill  . acetaminophen (TYLENOL) 325 MG tablet Take 2 tablets (650 mg total) by mouth every 4 (four) hours as needed for mild  pain, moderate pain, fever or headache.    . ALPRAZolam (XANAX) 0.25 MG tablet Take 0.25 mg by mouth 3 (three) times daily as needed.    Marland Kitchen atorvastatin (LIPITOR) 40 MG tablet Take 1 tablet (40 mg total) by mouth daily. 90 tablet 3  . clopidogrel (PLAVIX) 75 MG tablet Take 1 tablet (75 mg total) by mouth daily. 90 tablet 3  . glimepiride (AMARYL) 2 MG tablet Take 2 mg by mouth daily.      . hydrALAZINE (APRESOLINE) 100 MG tablet Take 1 tablet (100 mg total) by mouth 3 (three) times daily. 270 tablet 3  . HYDROcodone-acetaminophen (NORCO/VICODIN) 5-325 MG tablet Take 1 tablet by mouth 4 (four) times daily.     . isosorbide mononitrate (IMDUR) 60 MG 24 hr tablet Take 1 tablet (60 mg total) by mouth daily. 90 tablet 3  . metoprolol tartrate (LOPRESSOR) 25 MG tablet Take 0.5 tablets (12.5 mg total) by mouth 2 (two) times daily. 90 tablet 3  . nitroGLYCERIN (NITROSTAT) 0.4 MG SL tablet Place 1 tablet (0.4 mg total) under the tongue every 5 (five) minutes as needed for chest pain. 25 tablet 2  . pantoprazole (PROTONIX) 40 MG tablet Take 1 tablet (40 mg total) by mouth daily at 6 (six) AM. 90 tablet 3  . solifenacin (VESICARE) 5 MG tablet Take 5 mg by mouth daily.    Marland Kitchen torsemide (DEMADEX) 20 MG tablet Take 1 tablet (20 mg total) by mouth 2 (two) times daily. 60 tablet 12  . TRAVATAN Z 0.004 % SOLN ophthalmic solution  Place 1 drop into both eyes at bedtime.     Marland Kitchen. warfarin (COUMADIN) 5 MG tablet Take 1 tablet daily except 1/2 tablet on Wednesdays and Saturdays or as directed 30 tablet 3   No current facility-administered medications for this visit.    Allergies:  Patient has no known allergies.   Social History: The patient  reports that she has never smoked. She has never used smokeless tobacco. She reports that she does not drink alcohol or use drugs.   ROS:  Please see the history of present illness. Otherwise, complete review of systems is positive for hearing loss.  All other systems are reviewed  and negative.   Physical Exam: VS:  BP (!) 150/80   Pulse (!) 54   Ht 5\' 4"  (1.626 m)   Wt 157 lb (71.2 kg)   SpO2 95%   BMI 26.95 kg/m , BMI Body mass index is 26.95 kg/m.  Wt Readings from Last 3 Encounters:  01/28/16 157 lb (71.2 kg)  12/24/15 156 lb 15.5 oz (71.2 kg)  12/16/15 158 lb (71.7 kg)    Elderly woman, appears comfortable at rest. Uses a cane. HEENT: Conjunctiva and lids normal, oropharynx with moist mucosa.  Neck: Supple, no carotid bruits or thyromegaly.  Lungs: Clear with diminished breath sounds, nonlabored.  Cardiac: Irregularly irregular, no S3.  Abdomen: Soft, nontender, bowel sounds present.  Extremities: 1-2+ lower leg edema, left worse than right. Distal pulses one plus. Skin: Warm and dry. Musculoskeletal: No kyphosis. Neuropsychiatric: Alert and oriented 3, affect appropriate.  ECG: I personally reviewed the tracing from 12/02/2015 which showed rate-controlled atrial fibrillation with diffuse ST-T wave abnormalities consistent with ischemic heart disease.  Recent Labwork: 02/17/2015: TSH 3.793 03/02/2015: Magnesium 1.8 11/22/2015: ALT 17; AST 33 12/04/2015: B Natriuretic Peptide 943.6 12/16/2015: Hemoglobin 10.1; Platelets 295 12/24/2015: BUN 51; Creat 1.82; Potassium 4.6; Sodium 143     Component Value Date/Time   CHOL 188 05/10/2011 0930   TRIG 67 05/10/2011 0930   HDL 64 05/10/2011 0930   CHOLHDL 2.9 05/10/2011 0930   VLDL 13 05/10/2011 0930   LDLCALC 111 (H) 05/10/2011 0930    Other Studies Reviewed Today:  Echocardiogram 11/11/2015: Study Conclusions  - Left ventricle: The cavity size was normal. Wall thickness was   increased in a pattern of mild LVH. Systolic function was normal.   The estimated ejection fraction was in the range of 60% to 65%.   There is akinesis of the mid-apicalanteroseptal and apical   myocardium. The study is not technically sufficient to allow   evaluation of LV diastolic function. - Aortic valve:  Mildly calcified annulus. Trileaflet; mildly   thickened leaflets. - Mitral valve: Calcified annulus. There was trivial regurgitation. - Left atrium: The atrium was severely dilated. - Right atrium: The atrium was severely dilated. Central venous   pressure (est): 8 mm Hg. - Tricuspid valve: There was moderate regurgitation. - Pulmonary arteries: Systolic pressure was severely increased. PA   peak pressure: 84 mm Hg (S). - Pericardium, extracardiac: A trivial pericardial effusion was   identified posterior to the heart.  Impressions:  - Mild LVH with LVEF 60-65%. There is akinesis of the mid to apical   anteroseptal wall and apex with associated prominent   trabeculation, no obvious LV mural thrombus. Indeterminate   diastolic function. Severe biatrial enlargement. MAC with trivial   mitral regurgitation. Mildly sclerotic aortic valve. Moderate   tricuspid regurgitation with evidence of severe pulmonary   hypertension, PASP estimated 84 mmHg.  Trivial posterior   pericardial effusion.  Assessment and Plan:  1. Symptomatically stable CAD status post NSTEMI in October. She has multivessel disease, felt to be a poor candidate for CABG and now status post high risk DES to the circumflex, otherwise to continue medical therapy. She is off aspirin at this time, continues on Plavix along with her Coumadin for atrial fibrillation. No active bleeding problems with hemoglobin coming up. She has enjoyed cardiac rehabilitation.  2. Chronic atrial fibrillation, keep follow-up in the anticoagulation clinic on Coumadin. Heart rate is controlled on Lopressor.  3. CKD, stage 3. Follow-up creatinine down to 1.8.  4. Hyperlipidemia, on Lipitor.  Current medicines were reviewed with the patient today.  Disposition: Follow-up in 3 months.  Signed, Jonelle SidleSamuel G. Toniesha Zellner, MD, [facility] HospitalFACC 01/28/2016 4:06 PM    [facility] [address], [place], [place] [address] Phone:  [phone]; Fax: [phone]

## 2016-01-28 NOTE — Patient Instructions (Signed)
Medication Instructions:  Your physician recommends that you continue on your current medications as directed. Please refer to the Current Medication list given to you today.   Labwork: NONE  Testing/Procedures: NONE  Follow-Up: Your physician recommends that you schedule a follow-up appointment in: 3 MONTHS    Any Other Special Instructions Will Be Listed Below (If Applicable).     If you need a refill on your cardiac medications before your next appointment, please call your pharmacy.   

## 2016-01-29 ENCOUNTER — Encounter (HOSPITAL_COMMUNITY)
Admission: RE | Admit: 2016-01-29 | Discharge: 2016-01-29 | Disposition: A | Discharge status: Home / Self Care | Payer: Medicare Other | Source: Ambulatory Visit | Attending: Cardiology | Admitting: Cardiology

## 2016-01-29 DIAGNOSIS — I214 Non-ST elevation (NSTEMI) myocardial infarction: Secondary | ICD-10-CM | POA: Diagnosis not present

## 2016-01-29 NOTE — Progress Notes (Signed)
Daily Session Note  Patient Details  Name: Melissa [name] MRN: [id number] Date of Birth: Mar 25, 1930 Referring Provider:   Flowsheet Row CARDIAC REHAB PHASE II ORIENTATION from 12/24/2015 in [place]  Referring Provider  Dr. Domenic Polite      Encounter Date: 01/29/2016  Check In:     Session Check In - 01/29/16 1300      Check-In   Location [facility]   Staff Present Suzanne Boron, BS, EP, Exercise Physiologist;Debra Wynetta Emery, RN, BSN   Supervising physician immediately available to respond to emergencies See telemetry face sheet for immediately available MD   Medication changes reported     No   Fall or balance concerns reported    No   Warm-up and Cool-down Performed as group-led instruction   Resistance Training Performed Yes   VAD Patient? No     Pain Assessment   Currently in Pain? No/denies   Pain Score 0-No pain   Multiple Pain Sites No      Capillary Blood Glucose: No results found for this or any previous visit (from the past 24 hour(s)).   Goals Met:  Independence with exercise equipment Exercise tolerated well No report of cardiac concerns or symptoms Strength training completed today  Goals Unmet:  Not Applicable  Comments: Check out 200   Dr. Kate Sable is Medical Director for [place] and Pulmonary Rehab.

## 2016-02-01 ENCOUNTER — Encounter (HOSPITAL_COMMUNITY): Payer: Medicare Other

## 2016-02-03 ENCOUNTER — Encounter (HOSPITAL_COMMUNITY)
Admission: RE | Admit: 2016-02-03 | Discharge: 2016-02-03 | Disposition: A | Discharge status: Home / Self Care | Payer: Medicare Other | Source: Ambulatory Visit | Attending: Cardiology | Admitting: Cardiology

## 2016-02-03 DIAGNOSIS — I214 Non-ST elevation (NSTEMI) myocardial infarction: Secondary | ICD-10-CM

## 2016-02-03 NOTE — Progress Notes (Signed)
Daily Session Note  Patient Details  Name: Melissa [name] MRN: [id number] Date of Birth: 22-Jun-1930 Referring Provider:   Flowsheet Row CARDIAC REHAB PHASE II ORIENTATION from 12/24/2015 in [place]  Referring Provider  Dr. Domenic Polite      Encounter Date: 02/03/2016  Check In:     Session Check In - 02/03/16 1545      Check-In   Location [facility]   Staff Present [name], MS, EP, [facility], Exercise Physiologist;Olamide Lahaie Wynetta Emery, RN, BSN   Supervising physician immediately available to respond to emergencies See telemetry face sheet for immediately available MD   Medication changes reported     No   Fall or balance concerns reported    No   Warm-up and Cool-down Performed as group-led instruction   Resistance Training Performed Yes   VAD Patient? No     Pain Assessment   Currently in Pain? No/denies   Pain Score 0-No pain   Multiple Pain Sites No      Capillary Blood Glucose: No results found for this or any previous visit (from the past 24 hour(s)).   Goals Met:  Independence with exercise equipment Exercise tolerated well No report of cardiac concerns or symptoms Strength training completed today  Goals Unmet:  Not Applicable  Comments: Check out 1645.   Dr. Kate Sable is Medical Director for [facility] Cardiac and Pulmonary Rehab.

## 2016-02-05 ENCOUNTER — Encounter (HOSPITAL_COMMUNITY)
Admission: RE | Admit: 2016-02-05 | Discharge: 2016-02-05 | Disposition: A | Discharge status: Home / Self Care | Payer: Medicare Other | Source: Ambulatory Visit | Attending: Cardiology | Admitting: Cardiology

## 2016-02-05 DIAGNOSIS — I214 Non-ST elevation (NSTEMI) myocardial infarction: Secondary | ICD-10-CM

## 2016-02-05 NOTE — Progress Notes (Signed)
Daily Session Note  Patient Details  Name: Melissa [name] MRN: [id number] Date of Birth: 1930/11/13 Referring Provider:   Flowsheet Row CARDIAC REHAB PHASE II ORIENTATION from 12/24/2015 in [place]  Referring Provider  Dr. Domenic Polite      Encounter Date: 02/05/2016  Check In:     Session Check In - 02/05/16 1300      Check-In   Location [facility]   Staff Present [name], MS, EP, [place], Exercise Physiologist;Debra Wynetta Emery, RN, BSN;Murlean Seelye, BS, EP, Exercise Physiologist   Supervising physician immediately available to respond to emergencies See telemetry face sheet for immediately available MD   Medication changes reported     No   Fall or balance concerns reported    No   Warm-up and Cool-down Performed as group-led instruction   Resistance Training Performed Yes   VAD Patient? No     Pain Assessment   Currently in Pain? No/denies   Pain Score 0-No pain   Multiple Pain Sites No      Capillary Blood Glucose: No results found for this or any previous visit (from the past 24 hour(s)).   Goals Met:  Independence with exercise equipment Exercise tolerated well No report of cardiac concerns or symptoms Strength training completed today  Goals Unmet:  Not Applicable  Comments: Check out 200   Dr. Kate Sable is Medical Director for [place] and Pulmonary Rehab.

## 2016-02-08 ENCOUNTER — Encounter (HOSPITAL_COMMUNITY): Payer: Medicare Other

## 2016-02-10 ENCOUNTER — Ambulatory Visit (INDEPENDENT_AMBULATORY_CARE_PROVIDER_SITE_OTHER): Payer: Medicare Other | Admitting: *Deleted

## 2016-02-10 ENCOUNTER — Encounter (HOSPITAL_COMMUNITY)
Admission: RE | Admit: 2016-02-10 | Discharge: 2016-02-10 | Disposition: A | Discharge status: Home / Self Care | Payer: Medicare Other | Source: Ambulatory Visit | Attending: Cardiology | Admitting: Cardiology

## 2016-02-10 DIAGNOSIS — I4891 Unspecified atrial fibrillation: Secondary | ICD-10-CM

## 2016-02-10 DIAGNOSIS — I4819 Other persistent atrial fibrillation: Secondary | ICD-10-CM

## 2016-02-10 DIAGNOSIS — Z5181 Encounter for therapeutic drug level monitoring: Secondary | ICD-10-CM

## 2016-02-10 DIAGNOSIS — Z955 Presence of coronary angioplasty implant and graft: Secondary | ICD-10-CM | POA: Insufficient documentation

## 2016-02-10 DIAGNOSIS — I214 Non-ST elevation (NSTEMI) myocardial infarction: Secondary | ICD-10-CM | POA: Insufficient documentation

## 2016-02-10 DIAGNOSIS — I481 Persistent atrial fibrillation: Secondary | ICD-10-CM | POA: Diagnosis not present

## 2016-02-10 LAB — POCT INR: INR: 2.3

## 2016-02-10 NOTE — Progress Notes (Signed)
Daily Session Note  Patient Details  Name: Melissa [name] MRN: [id number] Date of Birth: [birth year] Referring Provider:   Flowsheet Row CARDIAC REHAB PHASE II ORIENTATION from 12/24/2015 in [place]  Referring Provider  Dr. Domenic Polite      Encounter Date: 02/10/2016  Check In:     Session Check In - 02/10/16 1545      Check-In   Location [facility]   Staff Present Kirstie Larsen Angelina Pih, MS, EP, [facility], Exercise Physiologist;Debra Wynetta Emery, RN, BSN   Supervising physician immediately available to respond to emergencies See telemetry face sheet for immediately available MD   Medication changes reported     No   Fall or balance concerns reported    No   Warm-up and Cool-down Performed as group-led instruction   Resistance Training Performed Yes   VAD Patient? No     Pain Assessment   Currently in Pain? No/denies   Pain Score 0-No pain   Multiple Pain Sites No      Capillary Blood Glucose: Results for orders placed or performed in visit on 02/10/16 (from the past 24 hour(s))  POCT INR     Status: Normal   Collection Time: 02/10/16  3:18 PM  Result Value Ref Range   INR 2.3      Goals Met:  Independence with exercise equipment Exercise tolerated well No report of cardiac concerns or symptoms Strength training completed today  Goals Unmet:  Not Applicable  Comments: Check out: 4:45   Dr. Kate Sable is Medical Director for [place] and Pulmonary Rehab.

## 2016-02-12 ENCOUNTER — Encounter (HOSPITAL_COMMUNITY)
Admission: RE | Admit: 2016-02-12 | Discharge: 2016-02-12 | Disposition: A | Discharge status: Home / Self Care | Payer: Medicare Other | Source: Ambulatory Visit | Attending: Cardiology | Admitting: Cardiology

## 2016-02-12 DIAGNOSIS — I214 Non-ST elevation (NSTEMI) myocardial infarction: Secondary | ICD-10-CM | POA: Diagnosis not present

## 2016-02-12 DIAGNOSIS — Z955 Presence of coronary angioplasty implant and graft: Secondary | ICD-10-CM | POA: Diagnosis not present

## 2016-02-12 NOTE — Progress Notes (Signed)
Daily Session Note  Patient Details  Name: Melissa [name] MRN: [id number] Date of Birth: April 27, 1930 Referring Provider:   Flowsheet Row CARDIAC REHAB PHASE II ORIENTATION from 12/24/2015 in [place]  Referring Provider  Dr. Domenic Polite      Encounter Date: 02/12/2016  Check In:     Session Check In - 02/12/16 1545      Check-In   Location [facility]   Staff Present [name], MS, EP, [facility], Exercise Physiologist;Haydn Cush Wynetta Emery, RN, BSN   Supervising physician immediately available to respond to emergencies See telemetry face sheet for immediately available MD   Medication changes reported     No   Fall or balance concerns reported    No   Warm-up and Cool-down Performed as group-led instruction   Resistance Training Performed Yes   VAD Patient? No     Pain Assessment   Currently in Pain? No/denies   Pain Score 0-No pain   Multiple Pain Sites No      Capillary Blood Glucose: No results found for this or any previous visit (from the past 24 hour(s)).   Goals Met:  Independence with exercise equipment Exercise tolerated well No report of cardiac concerns or symptoms Strength training completed today  Goals Unmet:  Not Applicable  Comments: Check out 1645.   Dr. Kate Sable is Medical Director for [facility] Cardiac and Pulmonary Rehab.

## 2016-02-15 ENCOUNTER — Encounter (HOSPITAL_COMMUNITY)
Admission: RE | Admit: 2016-02-15 | Discharge: 2016-02-15 | Disposition: A | Discharge status: Home / Self Care | Payer: Medicare Other | Source: Ambulatory Visit | Attending: Cardiology | Admitting: Cardiology

## 2016-02-15 DIAGNOSIS — I214 Non-ST elevation (NSTEMI) myocardial infarction: Secondary | ICD-10-CM

## 2016-02-15 DIAGNOSIS — Z955 Presence of coronary angioplasty implant and graft: Secondary | ICD-10-CM | POA: Diagnosis not present

## 2016-02-15 NOTE — Progress Notes (Signed)
Daily Session Note  Patient Details  Name: Melissa [name] MRN: [id number] Date of Birth: 31-Mar-1930 Referring Provider:   Flowsheet Row CARDIAC REHAB PHASE II ORIENTATION from 12/24/2015 in [place]  Referring Provider  Dr. Domenic Polite      Encounter Date: 02/15/2016  Check In:     Session Check In - 02/15/16 1545      Check-In   Location [facility]   Staff Present Suzanne Boron, BS, EP, Exercise Physiologist;Osaze Hubbert Wynetta Emery, RN, BSN   Supervising physician immediately available to respond to emergencies See telemetry face sheet for immediately available MD   Medication changes reported     No   Fall or balance concerns reported    No   Warm-up and Cool-down Performed as group-led instruction   Resistance Training Performed Yes   VAD Patient? No     Pain Assessment   Currently in Pain? No/denies   Pain Score 0-No pain   Multiple Pain Sites No      Capillary Blood Glucose: No results found for this or any previous visit (from the past 24 hour(s)).      Exercise Prescription Changes - 02/15/16 1400      Exercise Review   Progression Yes     Response to Exercise   Blood Pressure (Admit) 150/60   Blood Pressure (Exercise) 170/70   Blood Pressure (Exit) 170/80   Heart Rate (Admit) 55 bpm   Heart Rate (Exercise) 80 bpm   Heart Rate (Exit) 64 bpm   Rating of Perceived Exertion (Exercise) 13   Duration Progress to 30 minutes of continuous aerobic without signs/symptoms of physical distress   Intensity Rest + 30     Progression   Progression Continue progressive overload as per policy without signs/symptoms or physical distress.     Resistance Training   Training Prescription Yes   Weight 2   Reps 10-12     NuStep   Level 2   Watts 12   Minutes 20   METs 3.48     Arm Ergometer   Level 1.7   Watts 18   Minutes 15   METs 2.4     Home Exercise Plan   Plans to continue exercise at Home   Frequency Add 2 additional  days to program exercise sessions.     Goals Met:  Independence with exercise equipment Exercise tolerated well No report of cardiac concerns or symptoms Strength training completed today  Goals Unmet:  Not Applicable  Comments: Check out 1645.   Dr. Kate Sable is Medical Director for [facility] Cardiac and Pulmonary Rehab.

## 2016-02-17 ENCOUNTER — Encounter (HOSPITAL_COMMUNITY)
Admission: RE | Admit: 2016-02-17 | Discharge: 2016-02-17 | Disposition: A | Discharge status: Home / Self Care | Payer: Medicare Other | Source: Ambulatory Visit | Attending: Cardiology | Admitting: Cardiology

## 2016-02-17 DIAGNOSIS — I214 Non-ST elevation (NSTEMI) myocardial infarction: Secondary | ICD-10-CM | POA: Diagnosis not present

## 2016-02-17 DIAGNOSIS — I10 Essential (primary) hypertension: Secondary | ICD-10-CM | POA: Diagnosis not present

## 2016-02-17 DIAGNOSIS — Z955 Presence of coronary angioplasty implant and graft: Secondary | ICD-10-CM | POA: Diagnosis not present

## 2016-02-17 NOTE — Progress Notes (Signed)
Cardiac Individual Treatment Plan  Patient Details  Name: Melissa [name] MRN: [id number] Date of Birth: 23-Jul-1930 Referring Provider:   Flowsheet Row CARDIAC REHAB PHASE II ORIENTATION from 12/24/2015 in [facility] RE[facility]ABIL[facility]  Referring Provider  Dr. Diona Browner      Initial Encounter Date:  Flowsheet Row CARDIAC REHAB PHASE II ORIENTATION from 12/24/2015 in [place] CARDIAC REHABILITATION  Date  12/24/15  Referring Provider  Dr. Diona Browner      Visit Diagnosis: NSTEMI (non-ST elevated myocardial infarction) [facility])  Patient's Home Medications on Admission:  Current Outpatient Prescriptions:  .  acetaminophen (TYLENOL) 325 MG tablet, Take 2 tablets (650 mg total) by mouth every 4 (four) hours as needed for mild pain, moderate pain, fever or headache., Disp: , Rfl:  .  ALPRAZolam (XANAX) 0.25 MG tablet, Take 0.25 mg by mouth 3 (three) times daily as needed., Disp: , Rfl:  .  atorvastatin (LIPITOR) 40 MG tablet, Take 1 tablet (40 mg total) by mouth daily., Disp: 90 tablet, Rfl: 3 .  clopidogrel (PLAVIX) 75 MG tablet, Take 1 tablet (75 mg total) by mouth daily., Disp: 90 tablet, Rfl: 3 .  glimepiride (AMARYL) 2 MG tablet, Take 2 mg by mouth daily.  , Disp: , Rfl:  .  hydrALAZINE (APRESOLINE) 100 MG tablet, Take 1 tablet (100 mg total) by mouth 3 (three) times daily., Disp: 270 tablet, Rfl: 3 .  HYDROcodone-acetaminophen (NORCO/VICODIN) 5-325 MG tablet, Take 1 tablet by mouth 4 (four) times daily. , Disp: , Rfl:  .  isosorbide mononitrate (IMDUR) 60 MG 24 hr tablet, Take 1 tablet (60 mg total) by mouth daily., Disp: 90 tablet, Rfl: 3 .  metoprolol tartrate (LOPRESSOR) 25 MG tablet, Take 0.5 tablets (12.5 mg total) by mouth 2 (two) times daily., Disp: 90 tablet, Rfl: 3 .  nitroGLYCERIN (NITROSTAT) 0.4 MG SL tablet, Place 1 tablet (0.4 mg total) under the tongue every 5 (five) minutes as needed for chest pain., Disp: 25 tablet, Rfl: 2 .  pantoprazole (PROTONIX) 40 MG tablet,  Take 1 tablet (40 mg total) by mouth daily at 6 (six) AM., Disp: 90 tablet, Rfl: 3 .  solifenacin (VESICARE) 5 MG tablet, Take 5 mg by mouth daily., Disp: , Rfl:  .  torsemide (DEMADEX) 20 MG tablet, Take 1 tablet (20 mg total) by mouth 2 (two) times daily., Disp: 60 tablet, Rfl: 12 .  TRAVATAN Z 0.004 % SOLN ophthalmic solution, Place 1 drop into both eyes at bedtime. , Disp: , Rfl:  .  warfarin (COUMADIN) 5 MG tablet, Take 1 tablet daily except 1/2 tablet on Wednesdays and Saturdays or as directed, Disp: 30 tablet, Rfl: 3  Past Medical History: Past Medical History:  Diagnosis Date  . Atrial fibrillation (HCC)   . CAD (coronary artery disease)    Multivessel status post high risk PCI/DES to circumflex 11/2015 (poor candidate for CABG)  . CKD (chronic kidney disease) stage 3, GFR 30-59 ml/min   . DDD (degenerative disc disease), lumbar   . Essential hypertension, benign   . Mixed hyperlipidemia   . Type 2 diabetes mellitus (HCC)     Tobacco Use: History  Smoking Status  . Never Smoker  Smokeless Tobacco  . Never Used    Labs: Recent Review Flowsheet Data    Labs for ITP Cardiac and Pulmonary Rehab Latest Ref Rng & Units 10/05/2010 05/10/2011 09/28/2015 11/26/2015 11/26/2015   Cholestrol 0 - 200 mg/dL 086 578 - - -   LDLCALC 0 - 99 mg/dL 83  111(H) - - -   HDL >39 mg/dL 65 64 - - -   Trlycerides <150 mg/dL 55 67 - - -   Hemoglobin A1c % - - - - -   PHART 7.350 - 7.450 - - - - 7.383   PCO2ART 32.0 - 48.0 mmHg - - - - 43.1   HCO3 20.0 - 28.0 mmol/L - - - 25.1 25.6   TCO2 0 - 100 mmol/L - - 26 26 27    ACIDBASEDEF 0.0 - 2.0 mmol/L - - - - -   O2SAT % - - - 67.0 99.0      Capillary Blood Glucose: Lab Results  Component Value Date   GLUCAP 210 (H) 12/05/2015   GLUCAP 167 (H) 12/05/2015   GLUCAP 158 (H) 12/05/2015   GLUCAP 92 12/05/2015   GLUCAP 186 (H) 12/04/2015     Exercise Target Goals:    Exercise Program Goal: Individual exercise prescription set with THRR, safety  & activity barriers. Participant demonstrates ability to understand and report RPE using BORG scale, to self-measure pulse accurately, and to acknowledge the importance of the exercise prescription.  Exercise Prescription Goal: Starting with aerobic activity 30 plus minutes a day, 3 days per week for initial exercise prescription. Provide home exercise prescription and guidelines that participant acknowledges understanding prior to discharge.  Activity Barriers & Risk Stratification:     Activity Barriers & Cardiac Risk Stratification - 12/24/15 0954      Activity Barriers & Cardiac Risk Stratification   Activity Barriers Assistive Device  Uses a cane.   Cardiac Risk Stratification High      6 Minute Walk:     6 Minute Walk    Row Name 12/24/15 1003         6 Minute Walk   Phase Initial     Distance 800 feet     Distance % Change 0 %     Walk Time 6 minutes     # of Rest Breaks 0     MPH 1.51     METS 2.16     RPE 11     Perceived Dyspnea  12     VO2 Peak 7.38     Symptoms No     Resting HR 67 bpm     Resting BP 152/76     Max Ex. HR 124 bpm     Max Ex. BP 180/96     2 Minute Post BP 164/88        Initial Exercise Prescription:     Initial Exercise Prescription - 12/24/15 1000      Date of Initial Exercise RX and Referring Provider   Date 12/24/15   Referring Provider Dr. Diona Browner     NuStep   Level 2   Watts 15   Minutes 15   METs 1.9     Arm Ergometer   Level 1.5   Watts 15   Minutes 20   METs 1.9     Prescription Details   Frequency (times per week) 3   Duration Progress to 30 minutes of continuous aerobic without signs/symptoms of physical distress     Intensity   THRR REST +  30   THRR 40-80% of Max Heartrate [phone]   Ratings of Perceived Exertion 11-13   Perceived Dyspnea 0-4     Progression   Progression Continue progressive overload as per policy without signs/symptoms or physical distress.     Resistance Training   Training  Prescription Yes  Weight 1   Reps 10-12      Perform Capillary Blood Glucose checks as needed.  Exercise Prescription Changes:     Exercise Prescription Changes    Row Name 01/19/16 1100 02/15/16 1400           Exercise Review   Progression No Yes        Response to Exercise   Blood Pressure (Admit) 140/50 150/60      Blood Pressure (Exercise) 140/50 170/70      Blood Pressure (Exit) 150/70 170/80      Heart Rate (Admit) 51 bpm 55 bpm      Heart Rate (Exercise) 70 bpm 80 bpm      Heart Rate (Exit) 64 bpm 64 bpm      Rating of Perceived Exertion (Exercise) 12 13      Duration Progress to 30 minutes of continuous aerobic without signs/symptoms of physical distress Progress to 30 minutes of continuous aerobic without signs/symptoms of physical distress      Intensity Rest + 30 Rest + 30        Progression   Progression Continue progressive overload as per policy without signs/symptoms or physical distress. Continue progressive overload as per policy without signs/symptoms or physical distress.        Resistance Training   Training Prescription Yes Yes      Weight 1 2      Reps 10-12 10-12        NuStep   Level 2 2      Watts 8 12      Minutes 15 20      METs 1.9 3.48        Arm Ergometer   Level 1.5 1.7      Watts 15 18      Minutes 20 15      METs 1.9 2.4        Home Exercise Plan   Plans to continue exercise at Home Home      Frequency Add 2 additional days to program exercise sessions. Add 2 additional days to program exercise sessions.         Exercise Comments:     Exercise Comments    Row Name 01/19/16 1136 02/15/16 1412         Exercise Comments Patient is being slowly proggressed, Her age and hearing disabilities are making the progress longer.  Patient is progressing well.          Discharge Exercise Prescription (Final Exercise Prescription Changes):     Exercise Prescription Changes - 02/15/16 1400      Exercise Review   Progression  Yes     Response to Exercise   Blood Pressure (Admit) 150/60   Blood Pressure (Exercise) 170/70   Blood Pressure (Exit) 170/80   Heart Rate (Admit) 55 bpm   Heart Rate (Exercise) 80 bpm   Heart Rate (Exit) 64 bpm   Rating of Perceived Exertion (Exercise) 13   Duration Progress to 30 minutes of continuous aerobic without signs/symptoms of physical distress   Intensity Rest + 30     Progression   Progression Continue progressive overload as per policy without signs/symptoms or physical distress.     Resistance Training   Training Prescription Yes   Weight 2   Reps 10-12     NuStep   Level 2   Watts 12   Minutes 20   METs 3.48     Arm Ergometer   Level 1.7  Watts 18   Minutes 15   METs 2.4     Home Exercise Plan   Plans to continue exercise at Home   Frequency Add 2 additional days to program exercise sessions.      Nutrition:  Target Goals: Understanding of nutrition guidelines, daily intake of sodium 1500mg , cholesterol 200mg , calories 30% from fat and 7% or less from saturated fats, daily to have 5 or more servings of fruits and vegetables.  Biometrics:     Pre Biometrics - 12/24/15 1005      Pre Biometrics   Height 5\' 6"  (1.676 m)   Weight 156 lb 15.5 oz (71.2 kg)   Waist Circumference 37.5 inches   Hip Circumference 40 inches   Waist to Hip Ratio 0.94 %   BMI (Calculated) 25.4   Triceps Skinfold 5 mm   % Body Fat 30.8 %   Grip Strength 40.66 kg   Flexibility 17.33 in   Single Leg Stand 1 seconds       Nutrition Therapy Plan and Nutrition Goals:   Nutrition Discharge: Rate Your Plate Scores:     Nutrition Assessments - 12/24/15 0955      MEDFICTS Scores   Pre Score 9      Nutrition Goals Re-Evaluation:   Psychosocial: Target Goals: Acknowledge presence or absence of depression, maximize coping skills, provide positive support system. Participant is able to verbalize types and ability to use techniques and skills needed for reducing  stress and depression.  Initial Review & Psychosocial Screening:     Initial Psych Review & Screening - 12/24/15 0956      Family Dynamics   Good Support System? Yes     Barriers   Psychosocial barriers to participate in program There are no identifiable barriers or psychosocial needs.     Screening Interventions   Interventions Encouraged to exercise      Quality of Life Scores:     Quality of Life - 12/24/15 1006      Quality of Life Scores   Health/Function Pre 21.62 %   Socioeconomic Pre 21.4 %   Psych/Spiritual Pre 26.57 %   Family Pre 28.5 %   GLOBAL Pre 23.72 %      PHQ-9: Recent Review Flowsheet Data    Depression screen [facility] 2/9 12/24/2015   Decreased Interest 0   Down, Depressed, Hopeless 0   PHQ - 2 Score 0   Altered sleeping 0   Tired, decreased energy 0   Change in appetite 0   Feeling bad or failure about yourself  0   Trouble concentrating 0   Moving slowly or fidgety/restless 0   Suicidal thoughts 0   PHQ-9 Score 0   Difficult doing work/chores Not difficult at all      Psychosocial Evaluation and Intervention:     Psychosocial Evaluation - 12/24/15 0956      Psychosocial Evaluation & Interventions   Interventions Encouraged to exercise with the program and follow exercise prescription   Comments --  No psychosocial issues identified. QOL was 23.72 and PHQ-9 was 0.   Continued Psychosocial Services Needed No      Psychosocial Re-Evaluation:     Psychosocial Re-Evaluation    Row Name 01/20/16 1333 02/17/16 1509           Psychosocial Re-Evaluation   Interventions Encouraged to attend Cardiac Rehabilitation for the exercise Encouraged to attend Cardiac Rehabilitation for the exercise      Comments Patient continues to have no psychosocial issues  identified.  Patient continues to have no psychosocial issues identified.       Continued Psychosocial Services Needed No No         Vocational Rehabilitation: Provide vocational  rehab assistance to qualifying candidates.   Vocational Rehab Evaluation & Intervention:     Vocational Rehab - 12/24/15 0955      Initial Vocational Rehab Evaluation & Intervention   Assessment shows need for Vocational Rehabilitation No      Education: Education Goals: Education classes will be provided on a weekly basis, covering required topics. Participant will state understanding/return demonstration of topics presented.  Learning Barriers/Preferences:     Learning Barriers/Preferences - 12/24/15 0954      Learning Barriers/Preferences   Learning Barriers Hearing  HOH   Learning Preferences Written Material;Audio      Education Topics: Hypertension, Hypertension Reduction -Define heart disease and high blood pressure. Discus how high blood pressure affects the body and ways to reduce high blood pressure.   Exercise and Your Heart -Discuss why it is important to exercise, the FITT principles of exercise, normal and abnormal responses to exercise, and how to exercise safely.   Angina -Discuss definition of angina, causes of angina, treatment of angina, and how to decrease risk of having angina.   Cardiac Medications -Review what the following cardiac medications are used for, how they affect the body, and side effects that may occur when taking the medications.  Medications include Aspirin, Beta blockers, calcium channel blockers, ACE Inhibitors, angiotensin receptor blockers, diuretics, digoxin, and antihyperlipidemics. Flowsheet Row CARDIAC REHAB PHASE II EXERCISE from 02/10/2016 in [place] CARDIAC REHABILITATION  Date  12/30/15  Educator  Jacelyn Grip  Instruction Review Code  2- meets goals/outcomes      Congestive Heart Failure -Discuss the definition of CHF, how to live with CHF, the signs and symptoms of CHF, and how keep track of weight and sodium intake. Flowsheet Row CARDIAC REHAB PHASE II EXERCISE from 02/10/2016 in [place] CARDIAC REHABILITATION   Date  01/06/16  Educator  Jae Dire  Instruction Review Code  2- meets goals/outcomes      Heart Disease and Intimacy -Discus the effect sexual activity has on the heart, how changes occur during intimacy as we age, and safety during sexual activity. Flowsheet Row CARDIAC REHAB PHASE II EXERCISE from 02/10/2016 in [place] CARDIAC REHABILITATION  Date  01/13/16  Educator  Diane coad  Instruction Review Code  2- meets goals/outcomes      Smoking Cessation / COPD -Discuss different methods to quit smoking, the health benefits of quitting smoking, and the definition of COPD. Flowsheet Row CARDIAC REHAB PHASE II EXERCISE from 02/10/2016 in [place] CARDIAC REHABILITATION  Date  01/20/16  Educator  Hart Rochester  Instruction Review Code  2- meets goals/outcomes      Nutrition I: Fats -Discuss the types of cholesterol, what cholesterol does to the heart, and how cholesterol levels can be controlled. Flowsheet Row CARDIAC REHAB PHASE II EXERCISE from 02/10/2016 in [place] CARDIAC REHABILITATION  Date  01/27/16  Educator  Hart Rochester  Instruction Review Code  2- meets goals/outcomes      Nutrition II: Labels -Discuss the different components of food labels and how to read food label Flowsheet Row CARDIAC REHAB PHASE II EXERCISE from 02/10/2016 in [place] CARDIAC REHABILITATION  Date  02/03/16  Educator  Hart Rochester  Instruction Review Code  2- meets goals/outcomes      Heart Parts and Heart Disease -Discuss the  anatomy of the heart, the pathway of blood circulation through the heart, and these are affected by heart disease. Flowsheet Row CARDIAC REHAB PHASE II EXERCISE from 02/10/2016 in [place] CARDIAC REHABILITATION  Date  02/10/16  Educator  Jae Dire  Instruction Review Code  2- meets goals/outcomes      Stress I: Signs and Symptoms -Discuss the causes of stress, how stress may lead to anxiety and depression, and ways to limit stress.   Stress II:  Relaxation -Discuss different types of relaxation techniques to limit stress.   Warning Signs of Stroke / TIA -Discuss definition of a stroke, what the signs and symptoms are of a stroke, and how to identify when someone is having stroke.   Knowledge Questionnaire Score:     Knowledge Questionnaire Score - 12/24/15 0955      Knowledge Questionnaire Score   Pre Score 19/24      Core Components/Risk Factors/Patient Goals at Admission:     Personal Goals and Risk Factors at Admission - 12/24/15 0957      Core Components/Risk Factors/Patient Goals on Admission    Weight Management Weight Maintenance   Sedentary Yes   Intervention Provide advice, education, support and counseling about physical activity/exercise needs.;Develop an individualized exercise prescription for aerobic and resistive training based on initial evaluation findings, risk stratification, comorbidities and participant's personal goals.   Expected Outcomes Achievement of increased cardiorespiratory fitness and enhanced flexibility, muscular endurance and strength shown through measurements of functional capacity and personal statement of participant.   Increase Strength and Stamina Yes   Intervention Provide advice, education, support and counseling about physical activity/exercise needs.;Develop an individualized exercise prescription for aerobic and resistive training based on initial evaluation findings, risk stratification, comorbidities and participant's personal goals.   Expected Outcomes Achievement of increased cardiorespiratory fitness and enhanced flexibility, muscular endurance and strength shown through measurements of functional capacity and personal statement of participant.   Diabetes Yes   Intervention Provide education about proper nutrition, including hydration, and aerobic/resistive exercise prescription along with prescribed medications to achieve blood glucose in normal ranges: Fasting glucose 65-99  mg/dL   Expected Outcomes Long Term: Attainment of HbA1C < 7%.   Hypertension Yes   Intervention Provide education on lifestyle modifcations including regular physical activity/exercise, weight management, moderate sodium restriction and increased consumption of fresh fruit, vegetables, and low fat dairy, alcohol moderation, and smoking cessation.   Expected Outcomes Short Term: Continued assessment and intervention until BP is < 140/9mm HG in hypertensive participants. < 130/63mm HG in hypertensive participants with diabetes, heart failure or chronic kidney disease.   Personal Goal Other Yes   Personal Goal Do things she use to do. Get back to cooking.   Intervention Patient will attend cardiac rehab 3 days/week and supplement with exercise 2 days/week at home.   Expected Outcomes Patient will meet her personal goals.       Core Components/Risk Factors/Patient Goals Review:      Goals and Risk Factor Review    Row Name 12/24/15 1001 01/20/16 1331 02/17/16 1459         Core Components/Risk Factors/Patient Goals Review   Personal Goals Review Weight Management/Obesity;Sedentary;Increase Strength and Stamina;Diabetes;Hypertension Increase Strength and Stamina;Weight Management/Obesity;Hypertension;Diabetes  Do things she used to do. Get back to cooking.  Weight Management/Obesity;Increase Strength and Stamina;Diabetes;Hypertension  Do things she used to do. Get back to cooking.     Review  - Patient has attended 9 sessions maintaining her weight. She has progressed some since starting.  Will continue to monitor for progress.  Patient has attended 19 sessions. She has maintained her weight. She is progressing well in the program with increased strength and stamina.      Expected Outcomes  - Patient will continue to attend sessions meeting her personal goals.  Patient will continue to attend sessions and complete the program meeting her personal goals.         Core Components/Risk  Factors/Patient Goals at Discharge (Final Review):      Goals and Risk Factor Review - 02/17/16 1459      Core Components/Risk Factors/Patient Goals Review   Personal Goals Review Weight Management/Obesity;Increase Strength and Stamina;Diabetes;Hypertension  Do things she used to do. Get back to cooking.   Review Patient has attended 19 sessions. She has maintained her weight. She is progressing well in the program with increased strength and stamina.    Expected Outcomes Patient will continue to attend sessions and complete the program meeting her personal goals.       ITP Comments:   Comments: ITP 30 Day REVIEW Patient doing well with the program. Will continue to monitor for progress.

## 2016-02-17 NOTE — Progress Notes (Signed)
Daily Session Note  Patient Details  Name: Melissa [name] MRN: [id number] Date of Birth: 1930/03/03 Referring Provider:   Flowsheet Row CARDIAC REHAB PHASE II ORIENTATION from 12/24/2015 in [place]  Referring Provider  Dr. Domenic Polite      Encounter Date: 02/17/2016  Check In:     Session Check In - 02/17/16 1545      Check-In   Location [facility]   Staff Present Suzanne Boron, BS, EP, Exercise Physiologist;Fradel Baldonado Wynetta Emery, RN, BSN   Supervising physician immediately available to respond to emergencies See telemetry face sheet for immediately available MD   Medication changes reported     No   Fall or balance concerns reported    No   Warm-up and Cool-down Performed as group-led instruction   Resistance Training Performed Yes   VAD Patient? No     Pain Assessment   Currently in Pain? No/denies   Pain Score 0-No pain   Multiple Pain Sites No      Capillary Blood Glucose: No results found for this or any previous visit (from the past 24 hour(s)).   Goals Met:  Independence with exercise equipment Exercise tolerated well No report of cardiac concerns or symptoms Strength training completed today  Goals Unmet:  Not Applicable  Comments: Check out 1645.   Dr. Kate Sable is Medical Director for [facility] Cardiac and Pulmonary Rehab.

## 2016-02-18 ENCOUNTER — Telehealth: Payer: Self-pay

## 2016-02-18 MED ORDER — AMLODIPINE BESYLATE 2.5 MG PO TABS: 2.5000 mg | ORAL_TABLET | Freq: Every day | ORAL | 3 refills | Status: DC

## 2016-02-18 NOTE — Telephone Encounter (Signed)
-----   Message from Jonelle SidleSamuel G McDowell, MD sent at 02/18/2016  8:05 AM EST ----- Regarding: FW: Blood Pressure elevated With blood pressure readings remaining elevated, consider starting Norvasc 2.5 mg daily. There is not optimal room to further adjust her standing antihypertensives, and she is not a candidate for ARB or ACE inhibitor with renal insufficiency.  ----- Message ----- From: Suann Larryebra L Johnson, RN Sent: 02/17/2016   4:27 PM To: Jonelle SidleSamuel G McDowell, MD Subject: Blood Pressure elevated                        Patient attends cardiac rehab. Her systolic b/p readings have been consistently elevated between 170-150. Diastolic runs 60-70. Today b/p was 170/88.   Please advise.  Thanks,  Jae Direebbie Johnson, RN

## 2016-02-18 NOTE — Telephone Encounter (Signed)
LM for pt to call back to discuss, e-scribed Norvasc 2.5 mg daily  to [facility]

## 2016-02-19 ENCOUNTER — Telehealth: Payer: Self-pay

## 2016-02-19 ENCOUNTER — Encounter (HOSPITAL_COMMUNITY)
Admission: RE | Admit: 2016-02-19 | Discharge: 2016-02-19 | Disposition: A | Discharge status: Home / Self Care | Payer: Medicare Other | Source: Ambulatory Visit | Attending: Cardiology | Admitting: Cardiology

## 2016-02-19 DIAGNOSIS — Z955 Presence of coronary angioplasty implant and graft: Secondary | ICD-10-CM | POA: Diagnosis not present

## 2016-02-19 DIAGNOSIS — I25119 Atherosclerotic heart disease of native coronary artery with unspecified angina pectoris: Secondary | ICD-10-CM | POA: Diagnosis not present

## 2016-02-19 DIAGNOSIS — I214 Non-ST elevation (NSTEMI) myocardial infarction: Secondary | ICD-10-CM

## 2016-02-19 DIAGNOSIS — E118 Type 2 diabetes mellitus with unspecified complications: Secondary | ICD-10-CM | POA: Diagnosis not present

## 2016-02-19 DIAGNOSIS — I129 Hypertensive chronic kidney disease with stage 1 through stage 4 chronic kidney disease, or unspecified chronic kidney disease: Secondary | ICD-10-CM | POA: Diagnosis not present

## 2016-02-19 DIAGNOSIS — I10 Essential (primary) hypertension: Secondary | ICD-10-CM | POA: Diagnosis not present

## 2016-02-19 DIAGNOSIS — E1129 Type 2 diabetes mellitus with other diabetic kidney complication: Secondary | ICD-10-CM | POA: Diagnosis not present

## 2016-02-19 NOTE — Telephone Encounter (Signed)
BP today at rehab was systolic 170 and 409,[id number],pt wants to try Norvasc 2.5 mg daily,escribed to [facility]

## 2016-02-19 NOTE — Progress Notes (Signed)
Daily Session Note  Patient Details  Name: ALEAH AHLGRIM MRN: [id number] Date of Birth: 07-09-1930 Referring Provider:   Flowsheet Row CARDIAC REHAB PHASE II ORIENTATION from 12/24/2015 in [place]  Referring Provider  Dr. Domenic Polite      Encounter Date: 02/19/2016  Check In:     Session Check In - 02/19/16 1545      Check-In   Location [facility]   Staff Present Suzanne Boron, BS, EP, Exercise Physiologist;Mercedes Fort Wynetta Emery, RN, BSN   Supervising physician immediately available to respond to emergencies See telemetry face sheet for immediately available MD   Medication changes reported     No   Fall or balance concerns reported    No   Warm-up and Cool-down Performed as group-led instruction   Resistance Training Performed Yes   VAD Patient? No     Pain Assessment   Currently in Pain? No/denies   Pain Score 0-No pain   Multiple Pain Sites No      Capillary Blood Glucose: No results found for this or any previous visit (from the past 24 hour(s)).   Goals Met:  Independence with exercise equipment Exercise tolerated well No report of cardiac concerns or symptoms Strength training completed today  Goals Unmet:  Not Applicable  Comments: Check out 1645.   Dr. Kate Sable is Medical Director for [facility] Cardiac and Pulmonary Rehab.

## 2016-02-19 NOTE — Telephone Encounter (Signed)
-----   Message from Samuel G McDowell, MD sent at 02/18/2016  8:05 AM EST ----- Regarding: FW: Blood Pressure elevated With blood pressure readings remaining elevated, consider starting Norvasc 2.5 mg daily. There is not optimal room to further adjust her standing antihypertensives, and she is not a candidate for ARB or ACE inhibitor with renal insufficiency.  ----- Message ----- From: Debra L Johnson, RN Sent: 02/17/2016   4:27 PM To: Samuel G McDowell, MD Subject: Blood Pressure elevated                        Patient attends cardiac rehab. Her systolic b/p readings have been consistently elevated between 170-150. Diastolic runs 60-70. Today b/p was 170/88.   Please advise.  Thanks,  Debbie Johnson, RN  

## 2016-02-19 NOTE — Telephone Encounter (Signed)
Daughter is going to call me [facility] today with BP readings and then we will decide then on if they want Norvasc

## 2016-02-22 ENCOUNTER — Encounter (HOSPITAL_COMMUNITY)
Admission: RE | Admit: 2016-02-22 | Discharge: 2016-02-22 | Disposition: A | Discharge status: Home / Self Care | Payer: Medicare Other | Source: Ambulatory Visit | Attending: Cardiology | Admitting: Cardiology

## 2016-02-22 DIAGNOSIS — I214 Non-ST elevation (NSTEMI) myocardial infarction: Secondary | ICD-10-CM | POA: Diagnosis not present

## 2016-02-22 DIAGNOSIS — Z955 Presence of coronary angioplasty implant and graft: Secondary | ICD-10-CM | POA: Diagnosis not present

## 2016-02-22 NOTE — Progress Notes (Signed)
Daily Session Note  Patient Details  Name: Melissa [name] MRN: [id number] Date of Birth: 02/16/1930 Referring Provider:   Flowsheet Row CARDIAC REHAB PHASE II ORIENTATION from 12/24/2015 in [place] [facility]  Referring Provider  Dr. McDowell      Encounter Date: 02/22/2016  Check In:     Session Check In - 02/22/16 1545      Check-In   Location [facility]   Staff Present Diane Coad, MS, EP, [facility], Exercise Physiologist; , RN, BSN   Supervising physician immediately available to respond to emergencies See telemetry face sheet for immediately available MD   Medication changes reported     No   Fall or balance concerns reported    No   Warm-up and Cool-down Performed as group-led instruction   Resistance Training Performed Yes   VAD Patient? No     Pain Assessment   Currently in Pain? No/denies   Pain Score 0-No pain   Multiple Pain Sites No      Capillary Blood Glucose: No results found for this or any previous visit (from the past 24 hour(s)).   Goals Met:  Independence with exercise equipment Exercise tolerated well No report of cardiac concerns or symptoms Strength training completed today  Goals Unmet:  Not Applicable  Comments: Check out 1645.   Dr. Suresh Koneswaran is Medical Director for [place] [facility]. 

## 2016-02-24 ENCOUNTER — Encounter (HOSPITAL_COMMUNITY): Admission: RE | Admit: 2016-02-24 | Payer: Medicare Other | Source: Ambulatory Visit

## 2016-02-26 ENCOUNTER — Encounter (HOSPITAL_COMMUNITY): Payer: Medicare Other

## 2016-02-29 ENCOUNTER — Encounter (HOSPITAL_COMMUNITY)
Admission: RE | Admit: 2016-02-29 | Discharge: 2016-02-29 | Disposition: A | Discharge status: Home / Self Care | Payer: Medicare Other | Source: Ambulatory Visit | Attending: Cardiology | Admitting: Cardiology

## 2016-02-29 DIAGNOSIS — I214 Non-ST elevation (NSTEMI) myocardial infarction: Secondary | ICD-10-CM

## 2016-02-29 DIAGNOSIS — Z955 Presence of coronary angioplasty implant and graft: Secondary | ICD-10-CM | POA: Diagnosis not present

## 2016-02-29 NOTE — Progress Notes (Signed)
Daily Session Note  Patient Details  Name: Melissa [name] MRN: [id number] Date of Birth: 1930-03-18 Referring Provider:   Flowsheet Row CARDIAC REHAB PHASE II ORIENTATION from 12/24/2015 in [place]  Referring Provider  Dr. Domenic Polite      Encounter Date: 02/29/2016  Check In:     Session Check In - 02/29/16 1545      Check-In   Location [facility]   Staff Present [name], MS, EP, [facility], Exercise Physiologist;Brittanie Dosanjh Wynetta Emery, RN, BSN   Supervising physician immediately available to respond to emergencies See telemetry face sheet for immediately available MD   Medication changes reported     No   Fall or balance concerns reported    No   Warm-up and Cool-down Performed as group-led instruction   Resistance Training Performed Yes   VAD Patient? No     Pain Assessment   Currently in Pain? No/denies   Pain Score 0-No pain   Multiple Pain Sites No      Capillary Blood Glucose: No results found for this or any previous visit (from the past 24 hour(s)).   Goals Met:  Independence with exercise equipment Exercise tolerated well No report of cardiac concerns or symptoms Strength training completed today  Goals Unmet:  Not Applicable  Comments: Check out 1645.   Dr. Kate Sable is Medical Director for [facility] Cardiac and Pulmonary Rehab.

## 2016-03-02 ENCOUNTER — Encounter (HOSPITAL_COMMUNITY)
Admission: RE | Admit: 2016-03-02 | Discharge: 2016-03-02 | Disposition: A | Discharge status: Home / Self Care | Payer: Medicare Other | Source: Ambulatory Visit | Attending: Cardiology | Admitting: Cardiology

## 2016-03-02 DIAGNOSIS — I214 Non-ST elevation (NSTEMI) myocardial infarction: Secondary | ICD-10-CM | POA: Diagnosis not present

## 2016-03-02 DIAGNOSIS — Z955 Presence of coronary angioplasty implant and graft: Secondary | ICD-10-CM | POA: Diagnosis not present

## 2016-03-02 NOTE — Progress Notes (Signed)
Daily Session Note  Patient Details  Name: Melissa [name] MRN: [id number] Date of Birth: [birth year] Referring Provider:   Flowsheet Row CARDIAC REHAB PHASE II ORIENTATION from 12/24/2015 in [place]  Referring Provider  Dr. Domenic Polite      Encounter Date: 03/02/2016  Check In:     Session Check In - 03/02/16 1545      Check-In   Location [facility]   Staff Present [name], MS, EP, [facility], Exercise Physiologist;Jacarri Gesner Wynetta Emery, RN, BSN   Supervising physician immediately available to respond to emergencies See telemetry face sheet for immediately available MD   Medication changes reported     No   Fall or balance concerns reported    No   Warm-up and Cool-down Performed as group-led instruction   Resistance Training Performed Yes   VAD Patient? No     Pain Assessment   Currently in Pain? No/denies   Pain Score 0-No pain   Multiple Pain Sites No      Capillary Blood Glucose: No results found for this or any previous visit (from the past 24 hour(s)).   Goals Met:  Independence with exercise equipment Exercise tolerated well No report of cardiac concerns or symptoms Strength training completed today  Goals Unmet:  Not Applicable  Comments:Check out 1645.   Dr. Kate Sable is Medical Director for [facility] Cardiac and Pulmonary Rehab.

## 2016-03-04 ENCOUNTER — Encounter (HOSPITAL_COMMUNITY)
Admission: RE | Admit: 2016-03-04 | Discharge: 2016-03-04 | Disposition: A | Discharge status: Home / Self Care | Payer: Medicare Other | Source: Ambulatory Visit | Attending: Cardiology | Admitting: Cardiology

## 2016-03-04 DIAGNOSIS — I214 Non-ST elevation (NSTEMI) myocardial infarction: Secondary | ICD-10-CM | POA: Diagnosis not present

## 2016-03-04 DIAGNOSIS — Z955 Presence of coronary angioplasty implant and graft: Secondary | ICD-10-CM

## 2016-03-04 NOTE — Progress Notes (Signed)
Daily Session Note  Patient Details  Name: MAISLEY HAINSWORTH MRN: [id number] Date of Birth: 1930-07-29 Referring Provider:   Flowsheet Row CARDIAC REHAB PHASE II ORIENTATION from 12/24/2015 in [place]  Referring Provider  Dr. Domenic Polite      Encounter Date: 03/04/2016  Check In:     Session Check In - 03/04/16 1545      Check-In   Location [facility]   Staff Present [name], MS, EP, [facility], Exercise Physiologist;English [place] Wynetta Emery, RN, BSN   Supervising physician immediately available to respond to emergencies See telemetry face sheet for immediately available MD   Medication changes reported     No   Fall or balance concerns reported    No   Warm-up and Cool-down Performed as group-led instruction   Resistance Training Performed Yes   VAD Patient? No     Pain Assessment   Currently in Pain? No/denies   Pain Score 0-No pain   Multiple Pain Sites No      Capillary Blood Glucose: No results found for this or any previous visit (from the past 24 hour(s)).   Goals Met:  Independence with exercise equipment Exercise tolerated well No report of cardiac concerns or symptoms Strength training completed today  Goals Unmet:  Not Applicable  Comments: Check out 1645.   Dr. Kate Sable is Medical Director for [facility] Cardiac and Pulmonary Rehab.

## 2016-03-07 ENCOUNTER — Encounter (HOSPITAL_COMMUNITY)
Admission: RE | Admit: 2016-03-07 | Discharge: 2016-03-07 | Disposition: A | Discharge status: Home / Self Care | Payer: Medicare Other | Source: Ambulatory Visit | Attending: Cardiology | Admitting: Cardiology

## 2016-03-07 DIAGNOSIS — Z955 Presence of coronary angioplasty implant and graft: Secondary | ICD-10-CM

## 2016-03-07 DIAGNOSIS — I214 Non-ST elevation (NSTEMI) myocardial infarction: Secondary | ICD-10-CM

## 2016-03-07 NOTE — Progress Notes (Signed)
Daily Session Note  Patient Details  Name: Melissa [name] MRN: [id number] Date of Birth: 04/05/1930 Referring Provider:   Flowsheet Row CARDIAC REHAB PHASE II ORIENTATION from 12/24/2015 in [place] [facility]  Referring Provider  Dr. McDowell      Encounter Date: 03/07/2016  Check In:     Session Check In - 03/07/16 1545      Check-In   Location [facility]   Staff Present Diane Coad, MS, EP, [facility], Exercise Physiologist; , RN, BSN   Supervising physician immediately available to respond to emergencies See telemetry face sheet for immediately available MD   Medication changes reported     No   Fall or balance concerns reported    No   Warm-up and Cool-down Performed as group-led instruction   Resistance Training Performed Yes   VAD Patient? No     Pain Assessment   Currently in Pain? No/denies   Pain Score 0-No pain   Multiple Pain Sites No      Capillary Blood Glucose: No results found for this or any previous visit (from the past 24 hour(s)).   Goals Met:  Independence with exercise equipment Exercise tolerated well No report of cardiac concerns or symptoms Strength training completed today  Goals Unmet:  Not Applicable  Comments: Check out 1645.   Dr. Suresh Koneswaran is Medical Director for [place] [facility]. 

## 2016-03-09 ENCOUNTER — Encounter (HOSPITAL_COMMUNITY)
Admission: RE | Admit: 2016-03-09 | Discharge: 2016-03-09 | Disposition: A | Discharge status: Home / Self Care | Payer: Medicare Other | Source: Ambulatory Visit | Attending: Cardiology | Admitting: Cardiology

## 2016-03-09 DIAGNOSIS — Z955 Presence of coronary angioplasty implant and graft: Secondary | ICD-10-CM

## 2016-03-09 DIAGNOSIS — I214 Non-ST elevation (NSTEMI) myocardial infarction: Secondary | ICD-10-CM

## 2016-03-09 NOTE — Progress Notes (Signed)
Daily Session Note  Patient Details  Name: Melissa [name] MRN: [id number] Date of Birth: Jun 17, 1930 Referring Provider:   Flowsheet Row CARDIAC REHAB PHASE II ORIENTATION from 12/24/2015 in [place]  Referring Provider  Dr. Domenic Polite      Encounter Date: 03/09/2016  Check In:     Session Check In - 03/09/16 1545      Check-In   Location [facility]   Staff Present [name], MS, EP, [facility], Exercise Physiologist;Roniya Tetro Wynetta Emery, RN, BSN   Supervising physician immediately available to respond to emergencies See telemetry face sheet for immediately available MD   Medication changes reported     No   Fall or balance concerns reported    No   Warm-up and Cool-down Performed as group-led instruction   Resistance Training Performed Yes   VAD Patient? No     Pain Assessment   Currently in Pain? No/denies   Pain Score 0-No pain   Multiple Pain Sites No      Capillary Blood Glucose: No results found for this or any previous visit (from the past 24 hour(s)).   Goals Met:  Independence with exercise equipment Exercise tolerated well No report of cardiac concerns or symptoms Strength training completed today  Goals Unmet:  Not Applicable  Comments: Check out 1645.   Dr. Kate Sable is Medical Director for [facility] Card[facility] and Pulmonary Rehab.

## 2016-03-11 ENCOUNTER — Encounter (HOSPITAL_COMMUNITY)
Admission: RE | Admit: 2016-03-11 | Discharge: 2016-03-11 | Disposition: A | Discharge status: Home / Self Care | Payer: Medicare Other | Source: Ambulatory Visit | Attending: Cardiology | Admitting: Cardiology

## 2016-03-11 DIAGNOSIS — Z955 Presence of coronary angioplasty implant and graft: Secondary | ICD-10-CM | POA: Diagnosis not present

## 2016-03-11 DIAGNOSIS — I214 Non-ST elevation (NSTEMI) myocardial infarction: Secondary | ICD-10-CM | POA: Insufficient documentation

## 2016-03-11 NOTE — Progress Notes (Signed)
Daily Session Note  Patient Details  Name: Melissa [name] MRN: [id number] Date of Birth: 03-20-1930 Referring Provider:   Flowsheet Row CARDIAC REHAB PHASE II ORIENTATION from 12/24/2015 in [place]  Referring Provider  Dr. Domenic Polite      Encounter Date: 03/11/2016  Check In:     Session Check In - 03/11/16 1545      Check-In   Location [facility]   Staff Present [name], MS, EP, [facility], Exercise Physiologist;Mica Releford Wynetta Emery, RN, BSN   Supervising physician immediately available to respond to emergencies See telemetry face sheet for immediately available MD   Medication changes reported     No   Fall or balance concerns reported    No   Warm-up and Cool-down Performed as group-led instruction   Resistance Training Performed Yes   VAD Patient? No     Pain Assessment   Currently in Pain? No/denies   Pain Score 0-No pain   Multiple Pain Sites No      Capillary Blood Glucose: No results found for this or any previous visit (from the past 24 hour(s)).   Goals Met:  Independence with exercise equipment Exercise tolerated well No report of cardiac concerns or symptoms Strength training completed today  Goals Unmet:  Not Applicable  Comments: Check out 1645.   Dr. Kate Sable is Medical Director for [facility] Cardiac and Pulmonary Rehab.

## 2016-03-14 ENCOUNTER — Encounter (HOSPITAL_COMMUNITY)
Admission: RE | Admit: 2016-03-14 | Discharge: 2016-03-14 | Disposition: A | Discharge status: Home / Self Care | Payer: Medicare Other | Source: Ambulatory Visit | Attending: Cardiology | Admitting: Cardiology

## 2016-03-14 DIAGNOSIS — I214 Non-ST elevation (NSTEMI) myocardial infarction: Secondary | ICD-10-CM | POA: Diagnosis not present

## 2016-03-14 DIAGNOSIS — Z955 Presence of coronary angioplasty implant and graft: Secondary | ICD-10-CM | POA: Diagnosis not present

## 2016-03-14 NOTE — Progress Notes (Signed)
Daily Session Note  Patient Details  Name: Melissa [name] MRN: [id number] Date of Birth: 1930/10/01 Referring Provider:   Flowsheet Row CARDIAC REHAB PHASE II ORIENTATION from 12/24/2015 in [place]  Referring Provider  Dr. Domenic Polite      Encounter Date: 03/14/2016  Check In:     Session Check In - 03/14/16 1545      Check-In   Location [facility]   Staff Present [name], MS, EP, [facility], Exercise Physiologist;Jaking Thayer Wynetta Emery, RN, BSN   Supervising physician immediately available to respond to emergencies See telemetry face sheet for immediately available MD   Medication changes reported     No   Fall or balance concerns reported    No   Warm-up and Cool-down Performed as group-led instruction   Resistance Training Performed Yes   VAD Patient? No     Pain Assessment   Currently in Pain? No/denies   Pain Score 0-No pain   Multiple Pain Sites No      Capillary Blood Glucose: No results found for this or any previous visit (from the past 24 hour(s)).      Exercise Prescription Changes - 03/14/16 1400      Exercise Review   Progression Yes     Response to Exercise   Blood Pressure (Admit) 110/50   Blood Pressure (Exercise) 120/60   Blood Pressure (Exit) 110/50   Heart Rate (Admit) 61 bpm   Heart Rate (Exercise) 63 bpm   Heart Rate (Exit) 76 bpm   Rating of Perceived Exertion (Exercise) 13   Duration Progress to 30 minutes of continuous aerobic without signs/symptoms of physical distress   Intensity Rest + 30     Progression   Progression Continue progressive overload as per policy without signs/symptoms or physical distress.     Resistance Training   Training Prescription Yes   Weight 3   Reps 10-12     NuStep   Level 2   Watts 10   Minutes 20   METs 3.46     Arm Ergometer   Level 2   Watts 18   Minutes 15   METs 2.3     Home Exercise Plan   Plans to continue exercise at Home   Frequency Add 2 additional  days to program exercise sessions.     Goals Met:  Independence with exercise equipment Exercise tolerated well No report of cardiac concerns or symptoms Strength training completed today  Goals Unmet:  Not Applicable  Comments: Check out 1645.   Dr. Kate Sable is Medical Director for [facility] Cardiac and Pulmonary Rehab.

## 2016-03-16 ENCOUNTER — Ambulatory Visit (INDEPENDENT_AMBULATORY_CARE_PROVIDER_SITE_OTHER): Payer: Medicare Other | Admitting: *Deleted

## 2016-03-16 ENCOUNTER — Encounter (HOSPITAL_COMMUNITY)
Admission: RE | Admit: 2016-03-16 | Discharge: 2016-03-16 | Disposition: A | Discharge status: Home / Self Care | Payer: Medicare Other | Source: Ambulatory Visit | Attending: Cardiology | Admitting: Cardiology

## 2016-03-16 DIAGNOSIS — I4891 Unspecified atrial fibrillation: Secondary | ICD-10-CM | POA: Diagnosis not present

## 2016-03-16 DIAGNOSIS — I481 Persistent atrial fibrillation: Secondary | ICD-10-CM | POA: Diagnosis not present

## 2016-03-16 DIAGNOSIS — I4819 Other persistent atrial fibrillation: Secondary | ICD-10-CM

## 2016-03-16 DIAGNOSIS — Z5181 Encounter for therapeutic drug level monitoring: Secondary | ICD-10-CM | POA: Diagnosis not present

## 2016-03-16 DIAGNOSIS — I214 Non-ST elevation (NSTEMI) myocardial infarction: Secondary | ICD-10-CM

## 2016-03-16 DIAGNOSIS — Z955 Presence of coronary angioplasty implant and graft: Secondary | ICD-10-CM

## 2016-03-16 LAB — POCT INR: INR: 2.9

## 2016-03-16 NOTE — Progress Notes (Signed)
Cardiac Individual Treatment Plan  Patient Details  Name: Melissa [name] MRN: [id number] Date of Birth: 06/18/1930 Referring Provider:   Flowsheet Row CARDIAC REHAB PHASE II ORIENTATION from 12/24/2015 in [place] [facility]  Referring Provider  Dr. McDowell      Initial Encounter Date:  Flowsheet Row CARDIAC REHAB PHASE II ORIENTATION from 12/24/2015 in [place] [facility]  Date  12/24/15  Referring Provider  Dr. McDowell      Visit Diagnosis: NSTEMI (non-ST elevated myocardial infarction) (HCC)  Status post coronary artery stent placement  Patient's Home Medications on Admission:  Current Outpatient Prescriptions:  .  acetaminophen (TYLENOL) 325 MG tablet, Take 2 tablets (650 mg total) by mouth every 4 (four) hours as needed for mild pain, moderate pain, fever or headache., Disp: , Rfl:  .  ALPRAZolam (XANAX) 0.25 MG tablet, Take 0.25 mg by mouth 3 (three) times daily as needed., Disp: , Rfl:  .  amLODipine (NORVASC) 2.5 MG tablet, Take 1 tablet (2.5 mg total) by mouth daily., Disp: 30 tablet, Rfl: 3 .  atorvastatin (LIPITOR) 40 MG tablet, Take 1 tablet (40 mg total) by mouth daily., Disp: 90 tablet, Rfl: 3 .  clopidogrel (PLAVIX) 75 MG tablet, Take 1 tablet (75 mg total) by mouth daily., Disp: 90 tablet, Rfl: 3 .  glimepiride (AMARYL) 2 MG tablet, Take 2 mg by mouth daily.  , Disp: , Rfl:  .  hydrALAZINE (APRESOLINE) 100 MG tablet, Take 1 tablet (100 mg total) by mouth 3 (three) times daily., Disp: 270 tablet, Rfl: 3 .  HYDROcodone-acetaminophen (NORCO/VICODIN) 5-325 MG tablet, Take 1 tablet by mouth 4 (four) times daily. , Disp: , Rfl:  .  isosorbide mononitrate (IMDUR) 60 MG 24 hr tablet, Take 1 tablet (60 mg total) by mouth daily., Disp: 90 tablet, Rfl: 3 .  metoprolol tartrate (LOPRESSOR) 25 MG tablet, Take 0.5 tablets (12.5 mg total) by mouth 2 (two) times daily., Disp: 90 tablet, Rfl: 3 .  nitroGLYCERIN (NITROSTAT) 0.4 MG SL tablet, Place 1  tablet (0.4 mg total) under the tongue every 5 (five) minutes as needed for chest pain., Disp: 25 tablet, Rfl: 2 .  pantoprazole (PROTONIX) 40 MG tablet, Take 1 tablet (40 mg total) by mouth daily at 6 (six) AM., Disp: 90 tablet, Rfl: 3 .  solifenacin (VESICARE) 5 MG tablet, Take 5 mg by mouth daily., Disp: , Rfl:  .  torsemide (DEMADEX) 20 MG tablet, Take 1 tablet (20 mg total) by mouth 2 (two) times daily., Disp: 60 tablet, Rfl: 12 .  TRAVATAN Z 0.004 % SOLN ophthalmic solution, Place 1 drop into both eyes at bedtime. , Disp: , Rfl:  .  warfarin (COUMADIN) 5 MG tablet, Take 1 tablet daily except 1/2 tablet on Wednesdays and Saturdays or as directed, Disp: 30 tablet, Rfl: 3  Past Medical History: Past Medical History:  Diagnosis Date  . Atrial fibrillation (HCC)   . CAD (coronary artery disease)    Multivessel status post high risk PCI/DES to circumflex 11/2015 (poor candidate for CABG)  . CKD (chronic kidney disease) stage 3, GFR 30-59 ml/min   . DDD (degenerative disc disease), lumbar   . Essential hypertension, benign   . Mixed hyperlipidemia   . Type 2 diabetes mellitus (HCC)     Tobacco Use: History  Smoking Status  . Never Smoker  Smokeless Tobacco  . Never Used    Labs: Recent Review Flowsheet Data    Labs for ITP Cardiac and Pulmonary Rehab Latest Ref   Rng & Units 10/05/2010 05/10/2011 09/28/2015 11/26/2015 11/26/2015   Cholestrol 0 - 200 mg/dL 161 096 - - -   LDLCALC 0 - 99 mg/dL 83 045(W) - - -   HDL >09 mg/dL 65 64 - - -   Trlycerides <150 mg/dL 55 67 - - -   Hemoglobin A1c % - - - - -   PHART 7.350 - 7.450 - - - - 7.383   PCO2ART 32.0 - 48.0 mmHg - - - - 43.1   HCO3 20.0 - 28.0 mmol/L - - - 25.1 25.6   TCO2 0 - 100 mmol/L - - 26 26 27    ACIDBASEDEF 0.0 - 2.0 mmol/L - - - - -   O2SAT % - - - 67.0 99.0      Capillary Blood Glucose: Lab Results  Component Value Date   GLUCAP 210 (H) 12/05/2015   GLUCAP 167 (H) 12/05/2015   GLUCAP 158 (H) 12/05/2015   GLUCAP 92  12/05/2015   GLUCAP 186 (H) 12/04/2015     Exercise Target Goals:    Exercise Program Goal: Individual exercise prescription set with THRR, safety & activity barriers. Participant demonstrates ability to understand and report RPE using BORG scale, to self-measure pulse accurately, and to acknowledge the importance of the exercise prescription.  Exercise Prescription Goal: Starting with aerobic activity 30 plus minutes a day, 3 days per week for initial exercise prescription. Provide home exercise prescription and guidelines that participant acknowledges understanding prior to discharge.  Activity Barriers & Risk Stratification:     Activity Barriers & Cardiac Risk Stratification - 12/24/15 0954      Activity Barriers & Cardiac Risk Stratification   Activity Barriers Assistive Device  Uses a cane.   Cardiac Risk Stratification High      6 Minute Walk:     6 Minute Walk    Row Name 12/24/15 1003         6 Minute Walk   Phase Initial     Distance 800 feet     Distance % Change 0 %     Walk Time 6 minutes     # of Rest Breaks 0     MPH 1.51     METS 2.16     RPE 11     Perceived Dyspnea  12     VO2 Peak 7.38     Symptoms No     Resting HR 67 bpm     Resting BP 152/76     Max Ex. HR 124 bpm     Max Ex. BP 180/96     2 Minute Post BP 164/88        Initial Exercise Prescription:     Initial Exercise Prescription - 12/24/15 1000      Date of Initial Exercise RX and Referring Provider   Date 12/24/15   Referring Provider Dr. Diona Browner     NuStep   Level 2   Watts 15   Minutes 15   METs 1.9     Arm Ergometer   Level 1.5   Watts 15   Minutes 20   METs 1.9     Prescription Details   Frequency (times per week) 3   Duration Progress to 30 minutes of continuous aerobic without signs/symptoms of physical distress     Intensity   THRR REST +  30   THRR 40-80% of Max Heartrate [phone]   Ratings of Perceived Exertion 11-13   Perceived Dyspnea 0-4  Progression   Progression Continue progressive overload as per policy without signs/symptoms or physical distress.     Resistance Training   Training Prescription Yes   Weight 1   Reps 10-12      Perform Capillary Blood Glucose checks as needed.  Exercise Prescription Changes:      Exercise Prescription Changes    Row Name 01/19/16 1100 02/15/16 1400 03/14/16 1400         Exercise Review   Progression No Yes Yes       Response to Exercise   Blood Pressure (Admit) 140/50 150/60 110/50     Blood Pressure (Exercise) 140/50 170/70 120/60     Blood Pressure (Exit) 150/70 170/80 110/50     Heart Rate (Admit) 51 bpm 55 bpm 61 bpm     Heart Rate (Exercise) 70 bpm 80 bpm 63 bpm     Heart Rate (Exit) 64 bpm 64 bpm 76 bpm     Rating of Perceived Exertion (Exercise) 12 13 13      Duration Progress to 30 minutes of continuous aerobic without signs/symptoms of physical distress Progress to 30 minutes of continuous aerobic without signs/symptoms of physical distress Progress to 30 minutes of continuous aerobic without signs/symptoms of physical distress     Intensity Rest + 30 Rest + 30 Rest + 30       Progression   Progression Continue progressive overload as per policy without signs/symptoms or physical distress. Continue progressive overload as per policy without signs/symptoms or physical distress. Continue progressive overload as per policy without signs/symptoms or physical distress.       Resistance Training   Training Prescription Yes Yes Yes     Weight 1 2 3      Reps 10-12 10-12 10-12       NuStep   Level 2 2 2      Watts 8 12 10      Minutes 15 20 20      METs 1.9 3.48 3.46       Arm Ergometer   Level 1.5 1.7 2     Watts 15 18 18      Minutes 20 15 15      METs 1.9 2.4 2.3       Home Exercise Plan   Plans to continue exercise at [facility]     Frequency Add 2 additional days to program exercise sessions. Add 2 additional days to program exercise sessions. Add 2  additional days to program exercise sessions.        Exercise Comments:      Exercise Comments    Row Name 01/19/16 1136 02/15/16 1412 03/14/16 1410       Exercise Comments Patient is being slowly proggressed, Her age and hearing disabilities are making the progress longer.  Patient is progressing well. Patient is proggressing well         Discharge Exercise Prescription (Final Exercise Prescription Changes):     Exercise Prescription Changes - 03/14/16 1400      Exercise Review   Progression Yes     Response to Exercise   Blood Pressure (Admit) 110/50   Blood Pressure (Exercise) 120/60   Blood Pressure (Exit) 110/50   Heart Rate (Admit) 61 bpm   Heart Rate (Exercise) 63 bpm   Heart Rate (Exit) 76 bpm   Rating of Perceived Exertion (Exercise) 13   Duration Progress to 30 minutes of continuous aerobic without signs/symptoms of physical distress   Intensity Rest + 30  Progression   Progression Continue progressive overload as per policy without signs/symptoms or physical distress.     Resistance Training   Training Prescription Yes   Weight 3   Reps 10-12     NuStep   Level 2   Watts 10   Minutes 20   METs 3.46     Arm Ergometer   Level 2   Watts 18   Minutes 15   METs 2.3     Home Exercise Plan   Plans to continue exercise at Home   Frequency Add 2 additional days to program exercise sessions.      Nutrition:  Target Goals: Understanding of nutrition guidelines, daily intake of sodium 1500mg , cholesterol 200mg , calories 30% from fat and 7% or less from saturated fats, daily to have 5 or more servings of fruits and vegetables.  Biometrics:     Pre Biometrics - 12/24/15 1005      Pre Biometrics   Height 5\' 6"  (1.676 m)   Weight 156 lb 15.5 oz (71.2 kg)   Waist Circumference 37.5 inches   Hip Circumference 40 inches   Waist to Hip Ratio 0.94 %   BMI (Calculated) 25.4   Triceps Skinfold 5 mm   % Body Fat 30.8 %   Grip Strength 40.66 kg    Flexibility 17.33 in   Single Leg Stand 1 seconds       Nutrition Therapy Plan and Nutrition Goals:   Nutrition Discharge: Rate Your Plate Scores:     Nutrition Assessments - 12/24/15 0955      MEDFICTS Scores   Pre Score 9      Nutrition Goals Re-Evaluation:   Psychosocial: Target Goals: Acknowledge presence or absence of depression, maximize coping skills, provide positive support system. Participant is able to verbalize types and ability to use techniques and skills needed for reducing stress and depression.  Initial Review & Psychosocial Screening:     Initial Psych Review & Screening - 12/24/15 0956      Family Dynamics   Good Support System? Yes     Barriers   Psychosocial barriers to participate in program There are no identifiable barriers or psychosocial needs.     Screening Interventions   Interventions Encouraged to exercise      Quality of Life Scores:     Quality of Life - 12/24/15 1006      Quality of Life Scores   Health/Function Pre 21.62 %   Socioeconomic Pre 21.4 %   Psych/Spiritual Pre 26.57 %   Family Pre 28.5 %   GLOBAL Pre 23.72 %      PHQ-9: Recent Review Flowsheet Data    Depression screen [facility] 2/9 12/24/2015   Decreased Interest 0   Down, Depressed, Hopeless 0   PHQ - 2 Score 0   Altered sleeping 0   Tired, decreased energy 0   Change in appetite 0   Feeling bad or failure about yourself  0   Trouble concentrating 0   Moving slowly or fidgety/restless 0   Suicidal thoughts 0   PHQ-9 Score 0   Difficult doing work/chores Not difficult at all      Psychosocial Evaluation and Intervention:     Psychosocial Evaluation - 12/24/15 0956      Psychosocial Evaluation & Interventions   Interventions Encouraged to exercise with the program and follow exercise prescription   Comments --  No psychosocial issues identified. QOL was 23.72 and PHQ-9 was 0.   Continued Psychosocial Services Needed  No      Psychosocial  Re-Evaluation:     Psychosocial Re-Evaluation    Row Name 01/20/16 1333 02/17/16 1509 03/16/16 1418         Psychosocial Re-Evaluation   Interventions Encouraged to attend Cardiac Rehabilitation for the exercise Encouraged to attend Cardiac Rehabilitation for the exercise Encouraged to attend Cardiac Rehabilitation for the exercise     Comments Patient continues to have no psychosocial issues identified.  Patient continues to have no psychosocial issues identified.  Patient continues to have no psychosocial issues identified.      Continued Psychosocial Services Needed No No No        Vocational Rehabilitation: Provide vocational rehab assistance to qualifying candidates.   Vocational Rehab Evaluation & Intervention:     Vocational Rehab - 12/24/15 0955      Initial Vocational Rehab Evaluation & Intervention   Assessment shows need for Vocational Rehabilitation No      Education: Education Goals: Education classes will be provided on a weekly basis, covering required topics. Participant will state understanding/return demonstration of topics presented.  Learning Barriers/Preferences:     Learning Barriers/Preferences - 12/24/15 0954      Learning Barriers/Preferences   Learning Barriers Hearing  HOH   Learning Preferences Written Material;Audio      Education Topics: Hypertension, Hypertension Reduction -Define heart disease and high blood pressure. Discus how high blood pressure affects the body and ways to reduce high blood pressure. Flowsheet Row CARDIAC REHAB PHASE II EXERCISE from 03/09/2016 in [place] [facility]  Date  03/09/16  Educator  DC  Instruction Review Code  2- meets goals/outcomes      Exercise and Your Heart -Discuss why it is important to exercise, the FITT principles of exercise, normal and abnormal responses to exercise, and how to exercise safely.   Angina -Discuss definition of angina, causes of angina, treatment of angina,  and how to decrease risk of having angina.   Cardiac Medications -Review what the following cardiac medications are used for, how they affect the body, and side effects that may occur when taking the medications.  Medications include Aspirin, Beta blockers, calcium channel blockers, ACE Inhibitors, angiotensin receptor blockers, diuretics, digoxin, and antihyperlipidemics. Flowsheet Row CARDIAC REHAB PHASE II EXERCISE from 03/09/2016 in [place] [facility]AC [facility]  Date  12/30/15  Educator  Jacelyn Grip  Instruction Review Code  2- meets goals/outcomes      Congestive Heart Failure -Discuss the definition of CHF, how to live with CHF, the signs and symptoms of CHF, and how keep track of weight and sodium intake. Flowsheet Row CARDIAC REHAB PHASE II EXERCISE from 03/09/2016 in [place] [facility]AC [facility]  Date  01/06/16  Educator  Jae Dire  Instruction Review Code  2- meets goals/outcomes      Heart Disease and Intimacy -Discus the effect sexual activity has on the heart, how changes occur during intimacy as we age, and safety during sexual activity. Flowsheet Row CARDIAC REHAB PHASE II EXERCISE from 03/09/2016 in [place] [facility]  Date  01/13/16  Educator  Diane coad  Instruction Review Code  2- meets goals/outcomes      Smoking Cessation / COPD -Discuss different methods to quit smoking, the health benefits of quitting smoking, and the definition of COPD. Flowsheet Row CARDIAC REHAB PHASE II EXERCISE from 03/09/2016 in [place] [facility]  Date  01/20/16  Educator  Hart Rochester  Instruction Review Code  2- meets goals/outcomes      Nutrition  I: Fats -Discuss the types of cholesterol, what cholesterol does to the heart, and how cholesterol levels can be controlled. Flowsheet Row [place]ARDIAC RE[facility] PH[place] [facility]  Date  01/27/16  Educator  Hart Rochesteriane Coad  Instruction Review Code  2- meets  goals/outcomes      Nutrition II: Labels -Discuss the different components of food labels and how to read food label Flowsheet Row CARDIAC REHAB PHASE II EX[place] [facility]  Date  02/03/16  Educator  Hart Rochesteriane Coad  Instruction Review Code  2- meets goals/outcomes      Heart Parts and Heart Disease -Discuss the anatomy of the heart, the pathway of blood circulation through the heart, and these are affected by heart disease. Flowsheet Row CARDIAC REHAB PHASE II EXERCISE [place] [facility]  Date  02/10/16  Educator  Jae Direebbie Zondra Lawlor  Instruction Review Code  2- meets goals/outcomes      Stress I: Signs and Symptoms -Discuss the causes of stress, how stress may lead to anxiety and depression, and ways to limit stress. Flowsheet Row CARDIAC REHAB PHASE II EX[place] [facility]  Date  02/17/16  Educator  GC  Instruction Review Code  2- meets goals/outcomes      Stress II: Relaxation -Discuss different types of relaxation techniques to limit stress.   Warning Signs of Stroke / TIA -Discuss definition of a stroke, what the signs and symptoms are of a stroke, and how to identify when someone is having stroke. Flowsheet Row CARDIAC REHAB PHASE II EXERCISE [place] [facility]  Date  03/02/16  Educator  DC  Instruction Review Code  2- meets goals/outcomes      Knowledge Questionnaire Score:     Knowledge Questionnaire Score - 12/24/15 0955      Knowledge Questionnaire Score   Pre Score 19/24      Core Components/Risk Factors/Patient Goals at Admission:     Personal Goals and Risk Factors at Admission - 12/24/15 0957      Core Components/Risk Factors/Patient Goals on Admission    Weight Management Weight Maintenance   Sedentary Yes   Intervention Provide advice, education, support and counseling about physical activity/exercise  needs.;Develop an individualized exercise prescription for aerobic and resistive training based on initial evaluation findings, risk stratification, comorbidities and participant's personal goals.   Expected Outcomes Achievement of increased cardiorespiratory fitness and enhanced flexibility, muscular endurance and strength shown through measurements of functional capacity and personal statement of participant.   Increase Strength and Stamina Yes   Intervention Provide advice, education, support and counseling about physical activity/exercise needs.;Develop an individualized exercise prescription for aerobic and resistive training based on initial evaluation findings, risk stratification, comorbidities and participant's personal goals.   Expected Outcomes Achievement of increased cardiorespiratory fitness and enhanced flexibility, muscular endurance and strength shown through measurements of functional capacity and personal statement of participant.   Diabetes Yes   Intervention Provide education about proper nutrition, including hydration, and aerobic/resistive exercise prescription along with prescribed medications to achieve blood glucose in normal ranges: Fasting glucose 65-99 mg/dL   Expected Outcomes Long Term: Attainment of HbA1C < 7%.   Hypertension Yes   Intervention Provide education on lifestyle modifcations including regular physical activity/exercise, weight management, moderate sodium restriction and increased consumption of fresh fruit, vegetables, and low fat dairy, alcohol moderation, and smoking cessation.   Expected Outcomes Short Term: Continued assessment and  intervention until BP is < 140/61mm HG in hypertensive participants. < 130/34mm HG in hypertensive participants with diabetes, heart failure or chronic kidney disease.   Personal Goal Other Yes   Personal Goal Do things she use to do. Get back to cooking.   Intervention Patient will attend cardiac rehab 3 days/week and  supplement with exercise 2 days/week at home.   Expected Outcomes Patient will meet her personal goals.       Core Components/Risk Factors/Patient Goals Review:      Goals and Risk Factor Review    Row Name 12/24/15 1001 01/20/16 1331 02/17/16 1459 03/16/16 1414       Core Components/Risk Factors/Patient Goals Review   Personal Goals Review Weight Management/Obesity;Sedentary;Increase Strength and Stamina;Diabetes;Hypertension Increase Strength and Stamina;Weight Management/Obesity;Hypertension;Diabetes  Do things she used to do. Get back to cooking.  Weight Management/Obesity;Increase Strength and Stamina;Diabetes;Hypertension  Do things she used to do. Get back to cooking. Weight Management/Obesity;Increase Strength and Stamina;Diabetes;Hypertension  Doing things she used to do. Get back to cooking.    Review  - Patient has attended 9 sessions maintaining her weight. She has progressed some since starting. Will continue to monitor for progress.  Patient has attended 19 sessions. She has maintained her weight. She is progressing well in the program with increased strength and stamina.  Patient has attended 29 sessions. She has progressed well in the program. She has lost 2.2 lbs. Her b/p is better controlled since starting Norvasc. She still needs to work on DM control. She continues to report fasting glucose readings greater than 120.     Expected Outcomes  - Patient will continue to attend sessions meeting her personal goals.  Patient will continue to attend sessions and complete the program meeting her personal goals.  Patient will complete the program meeting her personal goals.        Core Components/Risk Factors/Patient Goals at Discharge (Final Review):      Goals and Risk Factor Review - 03/16/16 1414      Core Components/Risk Factors/Patient Goals Review   Personal Goals Review Weight Management/Obesity;Increase Strength and Stamina;Diabetes;Hypertension  Doing things she used  to do. Get back to cooking.   Review Patient has attended 29 sessions. She has progressed well in the program. She has lost 2.2 lbs. Her b/p is better controlled since starting Norvasc. She still needs to work on DM control. She continues to report fasting glucose readings greater than 120.    Expected Outcomes Patient will complete the program meeting her personal goals.       ITP Comments:   Comments: ITP 30 Day REVIEW Patient doing well with the program. Will continue to monitor for progress.

## 2016-03-16 NOTE — Progress Notes (Signed)
Daily Session Note  Patient Details  Name: CAREEN MAUCH MRN: [id number] Date of Birth: 1930/12/04 Referring Provider:   Flowsheet Row CARDIAC REHAB PHASE II ORIENTATION from 12/24/2015 in [place]  Referring Provider  Dr. Domenic Polite      Encounter Date: 03/16/2016  Check In:     Session Check In - 03/16/16 1545      Check-In   Location [facility]   Staff Present [name], MS, EP, [facility], Exercise Physiologist;Decklin Weddington Wynetta Emery, RN, BSN   Supervising physician immediately available to respond to emergencies See telemetry face sheet for immediately available MD   Medication changes reported     No   Fall or balance concerns reported    No   Warm-up and Cool-down Performed as group-led instruction   Resistance Training Performed Yes   VAD Patient? No     Pain Assessment   Currently in Pain? No/denies   Pain Score 0-No pain   Multiple Pain Sites No      Capillary Blood Glucose: Results for orders placed or performed in visit on 03/16/16 (from the past 24 hour(s))  POCT INR     Status: Normal   Collection Time: 03/16/16  2:30 PM  Result Value Ref Range   INR 2.9      Goals Met:  Independence with exercise equipment Exercise tolerated well No report of cardiac concerns or symptoms Strength training completed today  Goals Unmet:  Not Applicable  Comments: Check out 1645.   Dr. Kate Sable is Medical Director for [facility] Cardiac and Pulmonary Rehab.

## 2016-03-18 ENCOUNTER — Encounter (HOSPITAL_COMMUNITY)
Admission: RE | Admit: 2016-03-18 | Discharge: 2016-03-18 | Disposition: A | Discharge status: Home / Self Care | Payer: Medicare Other | Source: Ambulatory Visit | Attending: Cardiology | Admitting: Cardiology

## 2016-03-18 DIAGNOSIS — Z955 Presence of coronary angioplasty implant and graft: Secondary | ICD-10-CM

## 2016-03-18 DIAGNOSIS — I214 Non-ST elevation (NSTEMI) myocardial infarction: Secondary | ICD-10-CM

## 2016-03-18 NOTE — Progress Notes (Signed)
Daily Session Note  Patient Details  Name: Melissa [name] MRN: [id number] Date of Birth: [birth year] Referring Provider:   Flowsheet Row CARDIAC REHAB PHASE II ORIENTATION from 12/24/2015 in [place]  Referring Provider  Dr. Domenic Polite      Encounter Date: 03/18/2016  Check In:     Session Check In - 03/18/16 1545      Check-In   Location [facility]   Staff Present [name], MS, EP, [facility], Exercise Physiologist;Arkin Imran Wynetta Emery, RN, BSN   Supervising physician immediately available to respond to emergencies See telemetry face sheet for immediately available MD   Medication changes reported     No   Fall or balance concerns reported    No   Warm-up and Cool-down Performed as group-led instruction   Resistance Training Performed Yes   VAD Patient? No     Pain Assessment   Currently in Pain? No/denies   Pain Score 0-No pain   Multiple Pain Sites No      Capillary Blood Glucose: No results found for this or any previous visit (from the past 24 hour(s)).   Goals Met:  Independence with exercise equipment Exercise tolerated well No report of cardiac concerns or symptoms Strength training completed today  Goals Unmet:  Not Applicable  Comments: Check out 1645.   Dr. Kate Sable is Medical Director for [facility] Cardiac and Pulmonary Rehab.

## 2016-03-23 ENCOUNTER — Encounter (HOSPITAL_COMMUNITY)
Admission: RE | Admit: 2016-03-23 | Discharge: 2016-03-23 | Disposition: A | Discharge status: Home / Self Care | Payer: Medicare Other | Source: Ambulatory Visit | Attending: Cardiology | Admitting: Cardiology

## 2016-03-23 DIAGNOSIS — I214 Non-ST elevation (NSTEMI) myocardial infarction: Secondary | ICD-10-CM | POA: Diagnosis not present

## 2016-03-23 DIAGNOSIS — Z955 Presence of coronary angioplasty implant and graft: Secondary | ICD-10-CM | POA: Diagnosis not present

## 2016-03-23 NOTE — Progress Notes (Signed)
Daily Session Note  Patient Details  Name: COLLIN HENDLEY MRN: [id number] Date of Birth: 1930/04/28 Referring Provider:   Flowsheet Row CARDIAC REHAB PHASE II ORIENTATION from 12/24/2015 in [place]  Referring Provider  Dr. Domenic Polite      Encounter Date: 03/23/2016  Check In:     Session Check In - 03/23/16 1543      Check-In   Location [facility]   Staff Present Aundra Dubin, RN, BSN;Elkin Belfield Luther Parody, BS, EP, Exercise Physiologist   Supervising physician immediately available to respond to emergencies See telemetry face sheet for immediately available MD   Medication changes reported     No   Fall or balance concerns reported    No   Warm-up and Cool-down Performed as group-led instruction   Resistance Training Performed Yes   VAD Patient? No     Pain Assessment   Currently in Pain? No/denies   Pain Score 0-No pain   Multiple Pain Sites No      Capillary Blood Glucose: No results found for this or any previous visit (from the past 24 hour(s)).   Goals Met:  Independence with exercise equipment Exercise tolerated well No report of cardiac concerns or symptoms Strength training completed today  Goals Unmet:  Not Applicable  Comments: Check out 445   Dr. Kate Sable is Medical Director for [place] and Pulmonary Rehab.

## 2016-03-25 ENCOUNTER — Encounter (HOSPITAL_COMMUNITY)
Admission: RE | Admit: 2016-03-25 | Discharge: 2016-03-25 | Disposition: A | Discharge status: Home / Self Care | Payer: Medicare Other | Source: Ambulatory Visit | Attending: Cardiology | Admitting: Cardiology

## 2016-03-25 DIAGNOSIS — Z955 Presence of coronary angioplasty implant and graft: Secondary | ICD-10-CM | POA: Diagnosis not present

## 2016-03-25 DIAGNOSIS — I214 Non-ST elevation (NSTEMI) myocardial infarction: Secondary | ICD-10-CM

## 2016-03-25 NOTE — Progress Notes (Signed)
Daily Session Note  Patient Details  Name: Melissa [name] MRN: [id number] Date of Birth: 05/02/1930 Referring Provider:   Flowsheet Row CARDIAC REHAB PHASE II ORIENTATION from 12/24/2015 in [place]  Referring Provider  Dr. Domenic Polite      Encounter Date: 03/25/2016  Check In:     Session Check In - 03/25/16 1535      Check-In   Location [facility]   Staff Present Aundra Dubin, RN, BSN;Kelechi Orgeron Luther Parody, BS, EP, Exercise Physiologist   Supervising physician immediately available to respond to emergencies See telemetry face sheet for immediately available MD   Medication changes reported     No   Fall or balance concerns reported    No   Warm-up and Cool-down Performed as group-led instruction   Resistance Training Performed Yes   VAD Patient? No     Pain Assessment   Currently in Pain? No/denies   Pain Score 0-No pain   Multiple Pain Sites No      Capillary Blood Glucose: No results found for this or any previous visit (from the past 24 hour(s)).   Goals Met:  Independence with exercise equipment Exercise tolerated well No report of cardiac concerns or symptoms Strength training completed today  Goals Unmet:  Not Applicable  Comments: Check out 445   Dr. Kate Sable is Medical Director for [place] and Pulmonary Rehab.

## 2016-03-28 ENCOUNTER — Encounter (HOSPITAL_COMMUNITY)
Admission: RE | Admit: 2016-03-28 | Discharge: 2016-03-28 | Disposition: A | Discharge status: Home / Self Care | Payer: Medicare Other | Source: Ambulatory Visit | Attending: Cardiology | Admitting: Cardiology

## 2016-03-28 DIAGNOSIS — I214 Non-ST elevation (NSTEMI) myocardial infarction: Secondary | ICD-10-CM

## 2016-03-28 DIAGNOSIS — Z955 Presence of coronary angioplasty implant and graft: Secondary | ICD-10-CM | POA: Diagnosis not present

## 2016-03-28 NOTE — Progress Notes (Signed)
Daily Session Note  Patient Details  Name: Melissa [name] MRN: [id number] Date of Birth: 1930-12-13 Referring Provider:   Flowsheet Row CARDIAC REHAB PHASE II ORIENTATION from 12/24/2015 in [place]  Referring Provider  Dr. Domenic Polite      Encounter Date: 03/28/2016  Check In:     Session Check In - 03/28/16 1545      Check-In   Location [facility]   Staff Present [name], MS, EP, [facility], Exercise Physiologist;Tayo Maute Wynetta Emery, RN, BSN   Supervising physician immediately available to respond to emergencies See telemetry face sheet for immediately available MD   Medication changes reported     No   Fall or balance concerns reported    No   Warm-up and Cool-down Performed as group-led instruction   Resistance Training Performed Yes   VAD Patient? No     Pain Assessment   Currently in Pain? No/denies   Pain Score 0-No pain   Multiple Pain Sites No      Capillary Blood Glucose: No results found for this or any previous visit (from the past 24 hour(s)).   Goals Met:  Independence with exercise equipment Exercise tolerated well No report of cardiac concerns or symptoms Strength training completed today  Goals Unmet:  Not Applicable  Comments: Check out 1645.   Dr. Kate Sable is Medical Director for [facility] Cardiac and Pulmonary Rehab.

## 2016-03-30 ENCOUNTER — Encounter (HOSPITAL_COMMUNITY)
Admission: RE | Admit: 2016-03-30 | Discharge: 2016-03-30 | Disposition: A | Discharge status: Home / Self Care | Payer: Medicare Other | Source: Ambulatory Visit | Attending: Cardiology | Admitting: Cardiology

## 2016-03-30 DIAGNOSIS — Z955 Presence of coronary angioplasty implant and graft: Secondary | ICD-10-CM

## 2016-03-30 DIAGNOSIS — I214 Non-ST elevation (NSTEMI) myocardial infarction: Secondary | ICD-10-CM

## 2016-03-30 NOTE — Progress Notes (Signed)
Daily Session Note  Patient Details  Name: Melissa [name] MRN: [id number] Date of Birth: 08-Sep-1930 Referring Provider:   Flowsheet Row CARDIAC REHAB PHASE II ORIENTATION from 12/24/2015 in [place]  Referring Provider  Dr. Domenic Polite      Encounter Date: 03/30/2016  Check In:     Session Check In - 03/30/16 1545      Check-In   Location [facility]   Staff Present [name], MS, EP, [facility], Exercise Physiologist;[place] Bynum Wynetta Emery, RN, BSN   Supervising physician immediately available to respond to emergencies See telemetry face sheet for immediately available MD   Medication changes reported     No   Fall or balance concerns reported    No   Warm-up and Cool-down Performed as group-led instruction   Resistance Training Performed Yes   VAD Patient? No     Pain Assessment   Currently in Pain? No/denies   Pain Score 0-No pain   Multiple Pain Sites No      Capillary Blood Glucose: No results found for this or any previous visit (from the past 24 hour(s)).   Goals Met:  Independence with exercise equipment Exercise tolerated well No report of cardiac concerns or symptoms Strength training completed today  Goals Unmet:  Not Applicable  Comments: Check out 1645.   Dr. Kate Sable is Medical Director for [facility] Cardiac and Pulmonary Rehab.

## 2016-04-01 ENCOUNTER — Encounter (HOSPITAL_COMMUNITY)
Admission: RE | Admit: 2016-04-01 | Discharge: 2016-04-01 | Disposition: A | Discharge status: Home / Self Care | Payer: Medicare Other | Source: Ambulatory Visit | Attending: Cardiology | Admitting: Cardiology

## 2016-04-01 VITALS — Ht 66.0 in | Wt 157.5 lb

## 2016-04-01 DIAGNOSIS — I214 Non-ST elevation (NSTEMI) myocardial infarction: Secondary | ICD-10-CM

## 2016-04-01 DIAGNOSIS — Z955 Presence of coronary angioplasty implant and graft: Secondary | ICD-10-CM | POA: Diagnosis not present

## 2016-04-01 NOTE — Progress Notes (Signed)
Daily Session Note  Patient Details  Name: Melissa [name] MRN: [id number] Date of Birth: 07/31/1930 Referring Provider:   Flowsheet Row CARDIAC REHAB PHASE II ORIENTATION from 12/24/2015 in [place] CARDIAC REHABILITATION  Referring Provider  Dr. McDowell      Encounter Date: 04/01/2016  Check In:     Session Check In - 04/01/16 1545      Check-In   Location [facility]   Staff Present Diane Coad, MS, EP, [facility], Exercise Physiologist; , RN, BSN   Supervising physician immediately available to respond to emergencies See telemetry face sheet for immediately available MD   Medication changes reported     No   Fall or balance concerns reported    No   Warm-up and Cool-down Performed as group-led instruction   Resistance Training Performed Yes   VAD Patient? No     Pain Assessment   Currently in Pain? No/denies   Pain Score 0-No pain   Multiple Pain Sites No      Capillary Blood Glucose: No results found for this or any previous visit (from the past 24 hour(s)).   Goals Met:  Independence with exercise equipment Exercise tolerated well No report of cardiac concerns or symptoms Strength training completed today  Goals Unmet:  Not Applicable  Comments: Check out 1645   Dr. Suresh Koneswaran is Medical Director for [place] Cardiac and Pulmonary Rehab. 

## 2016-04-04 ENCOUNTER — Encounter (HOSPITAL_COMMUNITY)
Admission: RE | Admit: 2016-04-04 | Payer: Medicare Other | Source: Ambulatory Visit | Attending: Cardiology | Admitting: Cardiology

## 2016-04-05 NOTE — Progress Notes (Signed)
Cardiac Individual Treatment Plan  Patient Details  Name: Melissa [name] MRN: [id number] Date of Birth: 06/18/1930 Referring Provider:   Flowsheet Row CARDIAC REHAB PHASE II ORIENTATION from 12/24/2015 in [facility]  Referring Provider  Dr. McDowell      Initial Encounter Date:  Flowsheet Row CARDIAC REHAB PHASE II ORIENTATION from 12/24/2015 in [facility]  Date  12/24/15  Referring Provider  Dr. McDowell      Visit Diagnosis: NSTEMI (non-ST elevated myocardial infarction) (HCC)  Status post coronary artery stent placement  Patient's Home Medications on Admission:  Current Outpatient Prescriptions:  .  acetaminophen (TYLENOL) 325 MG tablet, Take 2 tablets (650 mg total) by mouth every 4 (four) hours as needed for mild pain, moderate pain, fever or headache., Disp: , Rfl:  .  ALPRAZolam (XANAX) 0.25 MG tablet, Take 0.25 mg by mouth 3 (three) times daily as needed., Disp: , Rfl:  .  amLODipine (NORVASC) 2.5 MG tablet, Take 1 tablet (2.5 mg total) by mouth daily., Disp: 30 tablet, Rfl: 3 .  atorvastatin (LIPITOR) 40 MG tablet, Take 1 tablet (40 mg total) by mouth daily., Disp: 90 tablet, Rfl: 3 .  clopidogrel (PLAVIX) 75 MG tablet, Take 1 tablet (75 mg total) by mouth daily., Disp: 90 tablet, Rfl: 3 .  glimepiride (AMARYL) 2 MG tablet, Take 2 mg by mouth daily.  , Disp: , Rfl:  .  hydrALAZINE (APRESOLINE) 100 MG tablet, Take 1 tablet (100 mg total) by mouth 3 (three) times daily., Disp: 270 tablet, Rfl: 3 .  HYDROcodone-acetaminophen (NORCO/VICODIN) 5-325 MG tablet, Take 1 tablet by mouth 4 (four) times daily. , Disp: , Rfl:  .  isosorbide mononitrate (IMDUR) 60 MG 24 hr tablet, Take 1 tablet (60 mg total) by mouth daily., Disp: 90 tablet, Rfl: 3 .  metoprolol tartrate (LOPRESSOR) 25 MG tablet, Take 0.5 tablets (12.5 mg total) by mouth 2 (two) times daily., Disp: 90 tablet, Rfl: 3 .  nitroGLYCERIN (NITROSTAT) 0.4 MG SL tablet, Place 1  tablet (0.4 mg total) under the tongue every 5 (five) minutes as needed for chest pain., Disp: 25 tablet, Rfl: 2 .  pantoprazole (PROTONIX) 40 MG tablet, Take 1 tablet (40 mg total) by mouth daily at 6 (six) AM., Disp: 90 tablet, Rfl: 3 .  solifenacin (VESICARE) 5 MG tablet, Take 5 mg by mouth daily., Disp: , Rfl:  .  torsemide (DEMADEX) 20 MG tablet, Take 1 tablet (20 mg total) by mouth 2 (two) times daily., Disp: 60 tablet, Rfl: 12 .  TRAVATAN Z 0.004 % SOLN ophthalmic solution, Place 1 drop into both eyes at bedtime. , Disp: , Rfl:  .  warfarin (COUMADIN) 5 MG tablet, Take 1 tablet daily except 1/2 tablet on Wednesdays and Saturdays or as directed, Disp: 30 tablet, Rfl: 3  Past Medical History: Past Medical History:  Diagnosis Date  . Atrial fibrillation (HCC)   . CAD (coronary artery disease)    Multivessel status post high risk PCI/DES to circumflex 11/2015 (poor candidate for CABG)  . CKD (chronic kidney disease) stage 3, GFR 30-59 ml/min   . DDD (degenerative disc disease), lumbar   . Essential hypertension, benign   . Mixed hyperlipidemia   . Type 2 diabetes mellitus (HCC)     Tobacco Use: History  Smoking Status  . Never Smoker  Smokeless Tobacco  . Never Used    Labs: Recent Review Flowsheet Data    Labs for ITP Cardiac and Pulmonary Rehab Latest Ref   Rng & Units 10/05/2010 05/10/2011 09/28/2015 11/26/2015 11/26/2015   Cholestrol 0 - 200 mg/dL 159 188 - - -   LDLCALC 0 - 99 mg/dL 83 111(H) - - -   HDL >39 mg/dL 65 64 - - -   Trlycerides <150 mg/dL 55 67 - - -   Hemoglobin A1c % - - - - -   PHART 7.350 - 7.450 - - - - 7.383   PCO2ART 32.0 - 48.0 mmHg - - - - 43.1   HCO3 20.0 - 28.0 mmol/L - - - 25.1 25.6   TCO2 0 - 100 mmol/L - - 26 26 27   ACIDBASEDEF 0.0 - 2.0 mmol/L - - - - -   O2SAT % - - - 67.0 99.0      Capillary Blood Glucose: Lab Results  Component Value Date   GLUCAP 210 (H) 12/05/2015   GLUCAP 167 (H) 12/05/2015   GLUCAP 158 (H) 12/05/2015   GLUCAP 92  12/05/2015   GLUCAP 186 (H) 12/04/2015     Exercise Target Goals:    Exercise Program Goal: Individual exercise prescription set with THRR, safety & activity barriers. Participant demonstrates ability to understand and report RPE using BORG scale, to self-measure pulse accurately, and to acknowledge the importance of the exercise prescription.  Exercise Prescription Goal: Starting with aerobic activity 30 plus minutes a day, 3 days per week for initial exercise prescription. Provide home exercise prescription and guidelines that participant acknowledges understanding prior to discharge.  Activity Barriers & Risk Stratification:     Activity Barriers & Cardiac Risk Stratification - 12/24/15 0954      Activity Barriers & Cardiac Risk Stratification   Activity Barriers Assistive Device  Uses a cane.   Cardiac Risk Stratification High      6 Minute Walk:     6 Minute Walk    Row Name 12/24/15 1003 04/05/16 0748       6 Minute Walk   Phase Initial Discharge    Distance 800 feet 950 feet    Distance % Change 0 % 18.75 %    Walk Time 6 minutes 6 minutes    # of Rest Breaks 0 0    MPH 1.51 1.79    METS 2.16 2.37    RPE 11 12    Perceived Dyspnea  12 11    VO2 Peak 7.38 6.67    Symptoms No No    Resting HR 67 bpm 89 bpm    Resting BP 152/76 134/70    Max Ex. HR 124 bpm 95 bpm    Max Ex. BP 180/96 170/82    2 Minute Post BP 164/88 150/76       Oxygen Initial Assessment:   Oxygen Re-Evaluation:   Oxygen Discharge (Final Oxygen Re-Evaluation):   Initial Exercise Prescription:     Initial Exercise Prescription - 12/24/15 1000      Date of Initial Exercise RX and Referring Provider   Date 12/24/15   Referring Provider Dr. McDowell     NuStep   Level 2   SPM 15   Minutes 15   METs 1.9     Arm Ergometer   Level 1.5   Watts 15   Minutes 20   METs 1.9     Prescription Details   Frequency (times per week) 3   Duration Progress to 30 minutes of  continuous aerobic without signs/symptoms of physical distress     Intensity   THRR REST +  30     THRR 40-80% of Max Heartrate [id number]   Ratings of Perceived Exertion 11-13   Perceived Dyspnea 0-4     Progression   Progression Continue progressive overload as per policy without signs/symptoms or physical distress.     Resistance Training   Training Prescription Yes   Weight 1   Reps 10-12      Perform Capillary Blood Glucose checks as needed.  Exercise Prescription Changes:      Exercise Prescription Changes    Row Name 01/19/16 1100 02/15/16 1400 03/14/16 1400         Response to Exercise   Blood Pressure (Admit) 140/50 150/60 110/50     Blood Pressure (Exercise) 140/50 170/70 120/60     Blood Pressure (Exit) 150/70 170/80 110/50     Heart Rate (Admit) 51 bpm 55 bpm 61 bpm     Heart Rate (Exercise) 70 bpm 80 bpm 63 bpm     Heart Rate (Exit) 64 bpm 64 bpm 76 bpm     Rating of Perceived Exertion (Exercise) _0 Duration Progress to 30 minutes of continuous aerobic without signs/symptoms of physical distress Progress to 30 minutes of continuous aerobic without signs/symptoms of physical distress Progress to 30 minutes of continuous aerobic without signs/symptoms of physical distress     Intensity Rest + 30 Rest + 30 Rest + 30       Progression   Progression Continue progressive overload as per policy without signs/symptoms or physical distress. Continue progressive overload as per policy without signs/symptoms or physical distress. Continue progressive overload as per policy without signs/symptoms or physical distress.       Resistance Training   Training Prescription Yes Yes Yes     Weight _1 Reps 10-12 10-12 10-12       NuStep   Level _2 SPM _3 Minutes _4 METs 1.9 3.48 3.46       Arm Ergometer   Level 1.5 1.7 2     Watts _5 Minutes _6 METs 1.9 2.4 2.3       Home Exercise Plan   Plans to  continue exercise at [facility]     Frequency Add 2 additional days to program exercise sessions. Add 2 additional days to program exercise sessions. Add 2 additional days to program exercise sessions.       Exercise Review   Progression No Yes Yes        Exercise Comments:      Exercise Comments    Row Name 01/19/16 1136 02/15/16 1412 03/14/16 1410       Exercise Comments Patient is being slowly proggressed, Her age and hearing disabilities are making the progress longer.  Patient is progressing well. Patient is proggressing well        Exercise Goals and Review:   Exercise Goals Re-Evaluation :     Exercise Goals Re-Evaluation    Row Name 04/05/16 1637             Exercise Goal Re-Evaluation   Exercise Goals Review Increase Physical Activity;Increase Strenth and Stamina       Comments Patient's strength and stamina did increase. She is more active now. She is able to walk more.        Expected Outcomes Patient will continue  to exercise at senior center.            Discharge Exercise Prescription (Final Exercise Prescription Changes):     Exercise Prescription Changes - 03/14/16 1400      Response to Exercise   Blood Pressure (Admit) 110/50   Blood Pressure (Exercise) 120/60   Blood Pressure (Exit) 110/50   Heart Rate (Admit) 61 bpm   Heart Rate (Exercise) 63 bpm   Heart Rate (Exit) 76 bpm   Rating of Perceived Exertion (Exercise) 13   Duration Progress to 30 minutes of continuous aerobic without signs/symptoms of physical distress   Intensity Rest + 30     Progression   Progression Continue progressive overload as per policy without signs/symptoms or physical distress.     Resistance Training   Training Prescription Yes   Weight 3   Reps 10-12     NuStep   Level 2   SPM 10   Minutes 20   METs 3.46     Arm Ergometer   Level 2   Watts 18   Minutes 15   METs 2.3     Home Exercise Plan   Plans to continue exercise at Home   Frequency  Add 2 additional days to program exercise sessions.     Exercise Review   Progression Yes      Nutrition:  Target Goals: Understanding of nutrition guidelines, daily intake of sodium <1500mg, cholesterol <200mg, calories 30% from fat and 7% or less from saturated fats, daily to have 5 or more servings of fruits and vegetables.  Biometrics:     Pre Biometrics - 12/24/15 1005      Pre Biometrics   Height 5' 6" (1.676 m)   Weight 156 lb 15.5 oz (71.2 kg)   Waist Circumference 37.5 inches   Hip Circumference 40 inches   Waist to Hip Ratio 0.94 %   BMI (Calculated) 25.4   Triceps Skinfold 5 mm   % Body Fat 30.8 %   Grip Strength 40.66 kg   Flexibility 17.33 in   Single Leg Stand 1 seconds         Post Biometrics - 04/05/16 0749       Post  Biometrics   Height 5' 6" (1.676 m)   Weight 157 lb 8.3 oz (71.5 kg)   Waist Circumference 35 inches   Hip Circumference 39 inches   Waist to Hip Ratio 0.9 %   BMI (Calculated) 25.5   Triceps Skinfold 6 mm   % Body Fat 30.8 %   Grip Strength 39.65 kg   Flexibility 13 in   Single Leg Stand 3 seconds      Nutrition Therapy Plan and Nutrition Goals:   Nutrition Discharge: Rate Your Plate Scores:     Nutrition Assessments - 04/05/16 1632      MEDFICTS Scores   Pre Score 9   Post Score 39   Score Difference 30      Nutrition Goals Re-Evaluation:   Nutrition Goals Discharge (Final Nutrition Goals Re-Evaluation):   Psychosocial: Target Goals: Acknowledge presence or absence of significant depression and/or stress, maximize coping skills, provide positive support system. Participant is able to verbalize types and ability to use techniques and skills needed for reducing stress and depression.  Initial Review & Psychosocial Screening:     Initial Psych Review & Screening - 12/24/15 0956      Family Dynamics   Good Support System? Yes     Barriers   Psychosocial barriers   to participate in program There are no  identifiable barriers or psychosocial needs.     Screening Interventions   Interventions Encouraged to exercise      Quality of Life Scores:     Quality of Life - 04/05/16 0750      Quality of Life Scores   Health/Function Pre 21.62 %   Health/Function Post 23.75 %   Health/Function % Change 9.85 %   Socioeconomic Pre 21.4 %   Socioeconomic Post 24.36 %   Socioeconomic % Change  13.83 %   Psych/Spiritual Pre 26.57 %   Psych/Spiritual Post 30 %   Psych/Spiritual % Change 12.91 %   Family Pre 28.5 %   Family Post 30 %   Family % Change 5.26 %   GLOBAL Pre 23.72 %   GLOBAL Post 26.03 %   GLOBAL % Change 9.74 %      PHQ-9: Recent Review Flowsheet Data    Depression screen PHQ 2/9 04/05/2016 12/24/2015   Decreased Interest 0 0   Down, Depressed, Hopeless 0 0   PHQ - 2 Score 0 0   Altered sleeping 1 0   Tired, decreased energy 1 0   Change in appetite 0 0   Feeling bad or failure about yourself  0 0   Trouble concentrating 0 0   Moving slowly or fidgety/restless 0 0   Suicidal thoughts 0 0   PHQ-9 Score 2 0   Difficult doing work/chores Not difficult at all Not difficult at all     Interpretation of Total Score  Total Score Depression Severity:  1-4 = Minimal depression, 5-9 = Mild depression, 10-14 = Moderate depression, 15-19 = Moderately severe depression, 20-27 = Severe depression   Psychosocial Evaluation and Intervention:     Psychosocial Evaluation - 04/05/16 1639      Discharge Psychosocial Assessment & Intervention   Comments Patient has no psychoscoial issues identified at discharge. She continues to have a very positive attitude.       Psychosocial Re-Evaluation:     Psychosocial Re-Evaluation    Row Name 01/20/16 1333 02/17/16 1509 03/16/16 1418         Psychosocial Re-Evaluation   Comments Patient continues to have no psychosocial issues identified.  Patient continues to have no psychosocial issues identified.  Patient continues to have no  psychosocial issues identified.      Interventions Encouraged to attend Cardiac Rehabilitation for the exercise Encouraged to attend Cardiac Rehabilitation for the exercise Encouraged to attend Cardiac Rehabilitation for the exercise     Continue Psychosocial Services  No No No        Psychosocial Discharge (Final Psychosocial Re-Evaluation):     Psychosocial Re-Evaluation - 03/16/16 1418      Psychosocial Re-Evaluation   Comments Patient continues to have no psychosocial issues identified.    Interventions Encouraged to attend Cardiac Rehabilitation for the exercise   Continue Psychosocial Services  No      Vocational Rehabilitation: Provide vocational rehab assistance to qualifying candidates.   Vocational Rehab Evaluation & Intervention:     Vocational Rehab - 12/24/15 0955      Initial Vocational Rehab Evaluation & Intervention   Assessment shows need for Vocational Rehabilitation No      Education: Education Goals: Education classes will be provided on a weekly basis, covering required topics. Participant will state understanding/return demonstration of topics presented.  Learning Barriers/Preferences:     Learning Barriers/Preferences - 12/24/15 0954      Learning Barriers/Preferences     Learning Barriers Hearing  HOH   Learning Preferences Written Material;Audio      Education Topics: Hypertension, Hypertension Reduction -Define heart disease and high blood pressure. Discus how high blood pressure affects the body and ways to reduce high blood pressure. Flowsheet Row CARDIAC REHAB PHASE II EXERCISE from 03/30/2016 in [facility]  Date  03/09/16  Educator  DC  Instruction Review Code  2- meets goals/outcomes      Exercise and Your Heart -Discuss why it is important to exercise, the FITT principles of exercise, normal and abnormal responses to exercise, and how to exercise safely. Flowsheet Row CARDIAC REHAB PHASE II EXERCISE from  03/30/2016 in [place] [facility]  Date  03/16/16  Educator  DC  Instruction Review Code  2- meets goals/outcomes      Angina -Discuss definition of angina, causes of angina, treatment of angina, and how to decrease risk of having angina. Flowsheet Row CARDIAC REHAB PHASE II EXERCISE from 03/30/2016 in [place] [facility]  Date  03/23/16  Educator  D.   Instruction Review Code  2- meets goals/outcomes      Cardiac Medications -Review what the following cardiac medications are used for, how they affect the body, and side effects that may occur when taking the medications.  Medications include Aspirin, Beta blockers, calcium channel blockers, ACE Inhibitors, angiotensin receptor blockers, diuretics, digoxin, and antihyperlipidemics. Flowsheet Row CARDIAC REHAB PHASE II EXERCISE from 03/30/2016 in [place] [facility]  Date  12/30/15  Educator  Greg Cowan  Instruction Review Code  2- meets goals/outcomes      Congestive Heart Failure -Discuss the definition of CHF, how to live with CHF, the signs and symptoms of CHF, and how keep track of weight and sodium intake. Flowsheet Row CARDIAC REHAB PHASE II EXERCISE from 03/30/2016 in [place] [facility]  Date  01/06/16  Educator  Debbie   Instruction Review Code  2- meets goals/outcomes      Heart Disease and Intimacy -Discus the effect sexual activity has on the heart, how changes occur during intimacy as we age, and safety during sexual activity. Flowsheet Row CARDIAC REHAB PHASE II EXERCISE from 03/30/2016 in [place] [facility]  Date  01/13/16  Educator  Diane coad  Instruction Review Code  2- meets goals/outcomes      Smoking Cessation / COPD -Discuss different methods to quit smoking, the health benefits of quitting smoking, and the definition of COPD. Flowsheet Row CARDIAC REHAB PHASE II EXERCISE from 03/30/2016 in [place] [facility]   Date  01/20/16  Educator  Diane Coad  Instruction Review Code  2- meets goals/outcomes      Nutrition I: Fats -Discuss the types of cholesterol, what cholesterol does to the heart, and how cholesterol levels can be controlled. Flowsheet Row CARDIAC REHAB PHASE II EXERCISE from 03/30/2016 in [place] [facility]  Date  01/27/16  Educator  Diane Coad  Instruction Review Code  2- meets goals/outcomes      Nutrition II: Labels -Discuss the different components of food labels and how to read food label Flowsheet Row CARDIAC REHAB PHASE II EXERCISE from 03/30/2016 in [place] [facility]  Date  02/03/16  Educator  Diane Coad  Instruction Review Code  2- meets goals/outcomes      Heart Parts and Heart Disease -Discuss the anatomy of the heart, the pathway of blood circulation through the heart, and these are affected by heart disease. Flowsheet Row CARDIAC REHAB PHASE   II EXERCISE from 03/30/2016 in [place] [facility]  Date  02/10/16  Educator  Debbie   Instruction Review Code  2- meets goals/outcomes      Stress I: Signs and Symptoms -Discuss the causes of stress, how stress may lead to anxiety and depression, and ways to limit stress. Flowsheet Row CARDIAC REHAB PHASE II EXERCISE from 03/30/2016 in [place] [facility]  Date  02/17/16  Educator  GC  Instruction Review Code  2- meets goals/outcomes      Stress II: Relaxation -Discuss different types of relaxation techniques to limit stress.   Warning Signs of Stroke / TIA -Discuss definition of a stroke, what the signs and symptoms are of a stroke, and how to identify when someone is having stroke. Flowsheet Row CARDIAC REHAB PHASE II EXERCISE from 03/30/2016 in [place] [facility]  Date  03/02/16  Educator  DC  Instruction Review Code  2- meets goals/outcomes      Knowledge Questionnaire Score:     Knowledge Questionnaire Score - 04/05/16 1632       Knowledge Questionnaire Score   Pre Score 19/24   Post Score 22/24      Core Components/Risk Factors/Patient Goals at Admission:     Personal Goals and Risk Factors at Admission - 12/24/15 0957      Core Components/Risk Factors/Patient Goals on Admission    Weight Management Weight Maintenance   Sedentary Yes   Intervention Provide advice, education, support and counseling about physical activity/exercise needs.;Develop an individualized exercise prescription for aerobic and resistive training based on initial evaluation findings, risk stratification, comorbidities and participant's personal goals.   Expected Outcomes Achievement of increased cardiorespiratory fitness and enhanced flexibility, muscular endurance and strength shown through measurements of functional capacity and personal statement of participant.   Increase Strength and Stamina Yes   Intervention Provide advice, education, support and counseling about physical activity/exercise needs.;Develop an individualized exercise prescription for aerobic and resistive training based on initial evaluation findings, risk stratification, comorbidities and participant's personal goals.   Expected Outcomes Achievement of increased cardiorespiratory fitness and enhanced flexibility, muscular endurance and strength shown through measurements of functional capacity and personal statement of participant.   Diabetes Yes   Intervention Provide education about proper nutrition, including hydration, and aerobic/resistive exercise prescription along with prescribed medications to achieve blood glucose in normal ranges: Fasting glucose 65-99 mg/dL   Expected Outcomes Long Term: Attainment of HbA1C < 7%.   Hypertension Yes   Intervention Provide education on lifestyle modifcations including regular physical activity/exercise, weight management, moderate sodium restriction and increased consumption of fresh fruit, vegetables, and low fat dairy,  alcohol moderation, and smoking cessation.   Expected Outcomes Short Term: Continued assessment and intervention until BP is < 140/90mm HG in hypertensive participants. < 130/80mm HG in hypertensive participants with diabetes, heart failure or chronic kidney disease.   Personal Goal Other Yes   Personal Goal Do things she use to do. Get back to cooking.   Intervention Patient will attend cardiac rehab 3 days/week and supplement with exercise 2 days/week at home.   Expected Outcomes Patient will meet her personal goals.       Core Components/Risk Factors/Patient Goals Review:      Goals and Risk Factor Review    Row Name 12/24/15 1001 01/20/16 1331 02/17/16 1459 03/16/16 1414 04/05/16 1634     Core Components/Risk Factors/Patient Goals Review   Personal Goals Review Weight Management/Obesity;Sedentary;Increase Strength and Stamina;Diabetes;Hypertension Increase Strength and Stamina;Weight Management/Obesity;Hypertension;Diabetes  Do   things she used to do. Get back to cooking.  Weight Management/Obesity;Increase Strength and Stamina;Diabetes;Hypertension  Do things she used to do. Get back to cooking. Weight Management/Obesity;Increase Strength and Stamina;Diabetes;Hypertension  Doing things she used to do. Get back to cooking. Weight Management/Obesity;Hypertension;Diabetes  Do things she used to do; get back to cooking again.    Review  - Patient has attended 9 sessions maintaining her weight. She has progressed some since starting. Will continue to monitor for progress.  Patient has attended 19 sessions. She has maintained her weight. She is progressing well in the program with increased strength and stamina.  Patient has attended 29 sessions. She has progressed well in the program. She has lost 2.2 lbs. Her b/p is better controlled since starting Norvasc. She still needs to work on DM control. She continues to report fasting glucose readings greater than 120.  Upon graduation, Patient lost 3.2  lbs since starting the program. Her waist and hip size also decreased at graduation. Her b/p was better controlled after medication adjustments but did fluctuate at times. Her diabetes also fluctuated. Patient is more active now and she is able to cook again. She says she feels much better since starting the program. Patient's MEDFICTS score did increase. Hopefully she will work on eating a healthier diet.    Expected Outcomes  - Patient will continue to attend sessions meeting her personal goals.  Patient will continue to attend sessions and complete the program meeting her personal goals.  Patient will complete the program meeting her personal goals.  Patient plans to continue to exercise at the senior center continuing to meet her goals.       Core Components/Risk Factors/Patient Goals at Discharge (Final Review):      Goals and Risk Factor Review - 04/05/16 1634      Core Components/Risk Factors/Patient Goals Review   Personal Goals Review Weight Management/Obesity;Hypertension;Diabetes  Do things she used to do; get back to cooking again.    Review Upon graduation, Patient lost 3.2 lbs since starting the program. Her waist and hip size also decreased at graduation. Her b/p was better controlled after medication adjustments but did fluctuate at times. Her diabetes also fluctuated. Patient is more active now and she is able to cook again. She says she feels much better since starting the program. Patient's MEDFICTS score did increase. Hopefully she will work on eating a healthier diet.    Expected Outcomes Patient plans to continue to exercise at the senior center continuing to meet her goals.       ITP Comments:   Comments: Patient graduated from Cardiac Rehabilitation today on 04/01/16 after completing 36 sessions. She achieved LTG of 30 minutes of aerobic exercise at Max Met level of 2.7. All patients vitals are WNL. Patient has met with dietician. Discharge instruction has been reviewed in  detail and patient stated an understanding of material given. Patient plans to exercise at the senior center. Cardiac Rehab staff will make f/u calls at 1 month, 6 months, and 1 year. Patient had no complaints of any abnormal S/S or pain on their exit visit.   

## 2016-04-05 NOTE — Progress Notes (Signed)
Discharge Summary  Patient Details  Name: Melissa [name] MRN: [id number] Date of Birth: 02/04/1931 Referring Provider:   Flowsheet Row CARDIAC REHAB PHASE II ORIENTATION from 12/24/2015 in [facility] CARDIAC REHABILITATION  Referring Provider  Dr. Diona Browner       Number of Visits: 36  Reason for Discharge:  Patient reached a stable level of exercise. Patient independent in their exercise.  Smoking History:  History  Smoking Status  . Never Smoker  Smokeless Tobacco  . Never Used    Diagnosis:  NSTEMI (non-ST elevated myocardial infarction) (HCC)  Status post coronary artery stent placement  ADL UCSD:   Initial Exercise Prescription:     Initial Exercise Prescription - 12/24/15 1000      Date of Initial Exercise RX and Referring Provider   Date 12/24/15   Referring Provider Dr. Diona Browner     NuStep   Level 2   SPM 15   Minutes 15   METs 1.9     Arm Ergometer   Level 1.5   Watts 15   Minutes 20   METs 1.9     Prescription Details   Frequency (times per week) 3   Duration Progress to 30 minutes of continuous aerobic without signs/symptoms of physical distress     Intensity   THRR REST +  30   THRR 40-80% of Max Heartrate [phone]   Ratings of Perceived Exertion 11-13   Perceived Dyspnea 0-4     Progression   Progression Continue progressive overload as per policy without signs/symptoms or physical distress.     Resistance Training   Training Prescription Yes   Weight 1   Reps 10-12      Discharge Exercise Prescription (Final Exercise Prescription Changes):     Exercise Prescription Changes - 03/14/16 1400      Response to Exercise   Blood Pressure (Admit) 110/50   Blood Pressure (Exercise) 120/60   Blood Pressure (Exit) 110/50   Heart Rate (Admit) 61 bpm   Heart Rate (Exercise) 63 bpm   Heart Rate (Exit) 76 bpm   Rating of Perceived Exertion (Exercise) 13   Duration Progress to 30 minutes of continuous aerobic without  signs/symptoms of physical distress   Intensity Rest + 30     Progression   Progression Continue progressive overload as per policy without signs/symptoms or physical distress.     Resistance Training   Training Prescription Yes   Weight 3   Reps 10-12     NuStep   Level 2   SPM 10   Minutes 20   METs 3.46     Arm Ergometer   Level 2   Watts 18   Minutes 15   METs 2.3     Home Exercise Plan   Plans to continue exercise at Home   Frequency Add 2 additional days to program exercise sessions.     Exercise Review   Progression Yes      Functional Capacity:     6 Minute Walk    Row Name 12/24/15 1003 04/05/16 0748       6 Minute Walk   Phase Initial Discharge    Distance 800 feet 950 feet    Distance % Change 0 % 18.75 %    Walk Time 6 minutes 6 minutes    # of Rest Breaks 0 0    MPH 1.51 1.79    METS 2.16 2.37    RPE 11 12    Perceived Dyspnea  12  11    VO2 Peak 7.38 6.67    Symptoms No No    Resting HR 67 bpm 89 bpm    Resting BP 152/76 134/70    Max Ex. HR 124 bpm 95 bpm    Max Ex. BP 180/96 170/82    2 Minute Post BP 164/88 150/76       Psychological, QOL, Others - Outcomes: PHQ 2/9: Depression screen [facility] 04/05/2016 12/24/2015  Decreased Interest 0 0  Down, Depressed, Hopeless 0 0  PHQ - 2 Score 0 0  Altered sleeping 1 0  Tired, decreased energy 1 0  Change in appetite 0 0  Feeling bad or failure about yourself  0 0  Trouble concentrating 0 0  Moving slowly or fidgety/restless 0 0  Suicidal thoughts 0 0  PHQ-9 Score 2 0  Difficult doing work/chores Not difficult at all Not difficult at all    Quality of Life:     Quality of Life - 04/05/16 0750      Quality of Life Scores   Health/Function Pre 21.62 %   Health/Function Post 23.75 %   Health/Function % Change 9.85 %   Socioeconomic Pre 21.4 %   Socioeconomic Post 24.36 %   Socioeconomic % Change  13.83 %   Psych/Spiritual Pre 26.57 %   Psych/Spiritual Post 30 %    Psych/Spiritual % Change 12.91 %   Family Pre 28.5 %   Family Post 30 %   Family % Change 5.26 %   GLOBAL Pre 23.72 %   GLOBAL Post 26.03 %   GLOBAL % Change 9.74 %      Personal Goals: Goals established at orientation with interventions provided to work toward goal.     Personal Goals and Risk Factors at Admission - 12/24/15 0957      Core Components/Risk Factors/Patient Goals on Admission    Weight Management Weight Maintenance   Sedentary Yes   Intervention Provide advice, education, support and counseling about physical activity/exercise needs.;Develop an individualized exercise prescription for aerobic and resistive training based on initial evaluation findings, risk stratification, comorbidities and participant's personal goals.   Expected Outcomes Achievement of increased cardiorespiratory fitness and enhanced flexibility, muscular endurance and strength shown through measurements of functional capacity and personal statement of participant.   Increase Strength and Stamina Yes   Intervention Provide advice, education, support and counseling about physical activity/exercise needs.;Develop an individualized exercise prescription for aerobic and resistive training based on initial evaluation findings, risk stratification, comorbidities and participant's personal goals.   Expected Outcomes Achievement of increased cardiorespiratory fitness and enhanced flexibility, muscular endurance and strength shown through measurements of functional capacity and personal statement of participant.   Diabetes Yes   Intervention Provide education about proper nutrition, including hydration, and aerobic/resistive exercise prescription along with prescribed medications to achieve blood glucose in normal ranges: Fasting glucose 65-99 mg/dL   Expected Outcomes Long Term: Attainment of HbA1C < 7%.   Hypertension Yes   Intervention Provide education on lifestyle modifcations including regular physical  activity/exercise, weight management, moderate sodium restriction and increased consumption of fresh fruit, vegetables, and low fat dairy, alcohol moderation, and smoking cessation.   Expected Outcomes Short Term: Continued assessment and intervention until BP is < 140/4290mm HG in hypertensive participants. < 130/6880mm HG in hypertensive participants with diabetes, heart failure or chronic kidney disease.   Personal Goal Other Yes   Personal Goal Do things she use to do. Get back to cooking.   Intervention Patient will  attend cardiac rehab 3 days/week and supplement with exercise 2 days/week at home.   Expected Outcomes Patient will meet her personal goals.        Personal Goals Discharge:     Goals and Risk Factor Review    Row Name 12/24/15 1001 01/20/16 1331 02/17/16 1459 03/16/16 1414 04/05/16 1634     Core Components/Risk Factors/Patient Goals Review   Personal Goals Review Weight Management/Obesity;Sedentary;Increase Strength and Stamina;Diabetes;Hypertension Increase Strength and Stamina;Weight Management/Obesity;Hypertension;Diabetes  Do things she used to do. Get back to cooking.  Weight Management/Obesity;Increase Strength and Stamina;Diabetes;Hypertension  Do things she used to do. Get back to cooking. Weight Management/Obesity;Increase Strength and Stamina;Diabetes;Hypertension  Doing things she used to do. Get back to cooking. Weight Management/Obesity;Hypertension;Diabetes  Do things she used to do; get back to cooking again.    Review  - Patient has attended 9 sessions maintaining her weight. She has progressed some since starting. Will continue to monitor for progress.  Patient has attended 19 sessions. She has maintained her weight. She is progressing well in the program with increased strength and stamina.  Patient has attended 29 sessions. She has progressed well in the program. She has lost 2.2 lbs. Her b/p is better controlled since starting Norvasc. She still needs to work on  DM control. She continues to report fasting glucose readings greater than 120.  Upon graduation, Patient lost 3.2 lbs since starting the program. Her waist and hip size also decreased at graduation. Her b/p was better controlled after medication adjustments but did fluctuate at times. Her diabetes also fluctuated. Patient is more active now and she is able to cook again. She says she feels much better since starting the program. Patient's MEDFICTS score did increase. Hopefully she will work on eating a healthier diet.    Expected Outcomes  - Patient will continue to attend sessions meeting her personal goals.  Patient will continue to attend sessions and complete the program meeting her personal goals.  Patient will complete the program meeting her personal goals.  Patient plans to continue to exercise at the senior center continuing to meet her goals.       Nutrition & Weight - Outcomes:     Pre Biometrics - 12/24/15 1005      Pre Biometrics   Height 5\' 6"  (1.676 m)   Weight 156 lb 15.5 oz (71.2 kg)   Waist Circumference 37.5 inches   Hip Circumference 40 inches   Waist to Hip Ratio 0.94 %   BMI (Calculated) 25.4   Triceps Skinfold 5 mm   % Body Fat 30.8 %   Grip Strength 40.66 kg   Flexibility 17.33 in   Single Leg Stand 1 seconds         Post Biometrics - 04/05/16 0749       Post  Biometrics   Height 5\' 6"  (1.676 m)   Weight 157 lb 8.3 oz (71.5 kg)   Waist Circumference 35 inches   Hip Circumference 39 inches   Waist to Hip Ratio 0.9 %   BMI (Calculated) 25.5   Triceps Skinfold 6 mm   % Body Fat 30.8 %   Grip Strength 39.65 kg   Flexibility 13 in   Single Leg Stand 3 seconds      Nutrition:   Nutrition Discharge:     Nutrition Assessments - 04/05/16 1632      MEDFICTS Scores   Pre Score 9   Post Score 39   Score Difference 30  Education Questionnaire Score:     Knowledge Questionnaire Score - 04/05/16 1632      Knowledge Questionnaire Score   Pre  Score 19/24   Post Score 22/24      Goals reviewed with patient; copy given to patient.

## 2016-04-06 ENCOUNTER — Encounter (HOSPITAL_COMMUNITY): Payer: Medicare Other

## 2016-04-25 DIAGNOSIS — E1129 Type 2 diabetes mellitus with other diabetic kidney complication: Secondary | ICD-10-CM | POA: Diagnosis not present

## 2016-04-25 DIAGNOSIS — I25119 Atherosclerotic heart disease of native coronary artery with unspecified angina pectoris: Secondary | ICD-10-CM | POA: Diagnosis not present

## 2016-04-25 DIAGNOSIS — I482 Chronic atrial fibrillation: Secondary | ICD-10-CM | POA: Diagnosis not present

## 2016-04-25 DIAGNOSIS — I5032 Chronic diastolic (congestive) heart failure: Secondary | ICD-10-CM | POA: Diagnosis not present

## 2016-04-27 ENCOUNTER — Ambulatory Visit (INDEPENDENT_AMBULATORY_CARE_PROVIDER_SITE_OTHER): Payer: Medicare Other | Admitting: *Deleted

## 2016-04-27 DIAGNOSIS — I4891 Unspecified atrial fibrillation: Secondary | ICD-10-CM | POA: Diagnosis not present

## 2016-04-27 DIAGNOSIS — Z5181 Encounter for therapeutic drug level monitoring: Secondary | ICD-10-CM | POA: Diagnosis not present

## 2016-04-27 DIAGNOSIS — I481 Persistent atrial fibrillation: Secondary | ICD-10-CM

## 2016-04-27 DIAGNOSIS — I4819 Other persistent atrial fibrillation: Secondary | ICD-10-CM

## 2016-04-27 LAB — POCT INR: INR: 2.8

## 2016-04-29 NOTE — Progress Notes (Signed)
Cardiology Office Note  Date: 05/03/2016   ID: Melissa [name], DOB Oct 06, 1930, MRN [id number]  PCP: Alonza Bogus, MD  Primary Cardiologist: Rozann Lesches, MD   Chief Complaint  Patient presents with  . Coronary Artery Disease  . Atrial Fibrillation    History of Present Illness:  Melissa [name] is an 83 y.o. female last seen in December 2017. She presents for a routine follow-up visit. Reports no angina symptoms at this time on medical therapy. Remains functional with her basic ADLs, does some walking outdoors for exercise when she can. She does have arthritic limitations.  I reviewed her current cardiac medications which are outlined below. She has had no significant changes and states that she has been compliant.  She is following in the anticoagulation clinic on Coumadin. INR was 2.8 recently. She has had no spontaneous bleeding problems.  Past Medical History:  Diagnosis Date  . Atrial fibrillation ([place])   . CAD (coronary artery disease)    Multivessel status post high risk PCI/DES to circumflex 11/2015 (poor candidate for CABG)  . CKD (chronic kidney disease) stage 3, GFR 30-59 ml/min   . DDD (degenerative disc disease), lumbar   . Essential hypertension, benign   . Mixed hyperlipidemia   . Type 2 diabetes mellitus ([place])     Past Surgical History:  Procedure Laterality Date  . CARDIAC CATHETERIZATION N/A 11/26/2015   Procedure: Right/Left Heart Cath and Coronary Angiography;  Surgeon: Nelva Bush, MD;  Location: [facility];  Service: Cardiovascular;  Laterality: N/A;  . CARDIAC CATHETERIZATION N/A 12/01/2015   Procedure: Coronary Stent Intervention Rotoblader;  Surgeon: Sherren Mocha, MD;  Location: [facility];  Service: Cardiovascular;  Laterality: N/A;  . TOTAL HIP ARTHROPLASTY  01/14/03   Left    Current Outpatient Prescriptions  Medication Sig Dispense Refill  . acetaminophen (TYLENOL) 325 MG tablet Take 2 tablets (650 mg  total) by mouth every 4 (four) hours as needed for mild pain, moderate pain, fever or headache.    . ALPRAZolam (XANAX) 0.25 MG tablet Take 0.25 mg by mouth 3 (three) times daily as needed.    Marland Kitchen amLODipine (NORVASC) 2.5 MG tablet Take 1 tablet (2.5 mg total) by mouth daily. 30 tablet 3  . atorvastatin (LIPITOR) 40 MG tablet Take 1 tablet (40 mg total) by mouth daily. 90 tablet 3  . clopidogrel (PLAVIX) 75 MG tablet Take 1 tablet (75 mg total) by mouth daily. 90 tablet 3  . glimepiride (AMARYL) 2 MG tablet Take 2 mg by mouth daily.      . hydrALAZINE (APRESOLINE) 100 MG tablet Take 1 tablet (100 mg total) by mouth 3 (three) times daily. 270 tablet 3  . HYDROcodone-acetaminophen (NORCO/VICODIN) 5-325 MG tablet Take 1 tablet by mouth 4 (four) times daily.     . isosorbide mononitrate (IMDUR) 60 MG 24 hr tablet Take 1 tablet (60 mg total) by mouth daily. 90 tablet 3  . metoprolol tartrate (LOPRESSOR) 25 MG tablet Take 0.5 tablets (12.5 mg total) by mouth 2 (two) times daily. 90 tablet 3  . nitroGLYCERIN (NITROSTAT) 0.4 MG SL tablet Place 1 tablet (0.4 mg total) under the tongue every 5 (five) minutes as needed for chest pain. 25 tablet 2  . pantoprazole (PROTONIX) 40 MG tablet Take 1 tablet (40 mg total) by mouth daily at 6 (six) AM. 90 tablet 3  . solifenacin (VESICARE) 5 MG tablet Take 5 mg by mouth daily.    Marland Kitchen torsemide (DEMADEX) 20  MG tablet Take 1 tablet (20 mg total) by mouth 2 (two) times daily. 60 tablet 12  . TRAVATAN Z 0.004 % SOLN ophthalmic solution Place 1 drop into both eyes at bedtime.     Marland Kitchen warfarin (COUMADIN) 5 MG tablet Take 1 tablet daily except 1/2 tablet on Wednesdays and Saturdays or as directed 30 tablet 3   No current facility-administered medications for this visit.    Allergies:  Patient has no known allergies.   Social History: The patient  reports that she has never smoked. She has never used smokeless tobacco. She reports that she does not drink alcohol or use drugs.    Family History: The patient's family history includes Diabetes type II in her father and mother; Hypertension in her father.   ROS:  Please see the history of present illness. Otherwise, complete review of systems is positive for arthritic pain and stiffness.  All other systems are reviewed and negative.   Physical Exam: VS:  BP 126/62   Pulse 62   Ht 5' 4"  (1.626 m)   Wt 155 lb (70.3 kg)   SpO2 94%   BMI 26.61 kg/m , BMI Body mass index is 26.61 kg/m.  Wt Readings from Last 3 Encounters:  05/03/16 155 lb (70.3 kg)  04/05/16 157 lb 8.3 oz (71.5 kg)  01/28/16 157 lb (71.2 kg)    Elderly woman, appears comfortable at rest. HEENT: Conjunctiva and lids normal, oropharynx with moist mucosa.  Neck: Supple, no carotid bruits or thyromegaly.  Lungs: Clear with diminished breath sounds, nonlabored.  Cardiac: Irregularly irregular, no S3.  Abdomen: Soft, nontender, bowel sounds present.  Extremities: 1+ lower legedema, left worse than right. Distal pulses one plus. Skin: Warm and dry. Musculoskeletal: No kyphosis. Neuropsychiatric: Alert and oriented 3, affect appropriate.  ECG: I personally reviewed the tracing from 12/02/2015 which showed atrial fibrillation with significant repolarization abnormalities.  Recent Labwork: 11/22/2015: ALT 17; AST 33 12/04/2015: B Natriuretic Peptide 943.6 12/16/2015: Hemoglobin 10.1; Platelets 295 12/24/2015: BUN 51; Creat 1.82; Potassium 4.6; Sodium 143   Other Studies Reviewed Today:  Echocardiogram 11/11/2015: Study Conclusions  - Left ventricle: The cavity size was normal. Wall thickness was increased in a pattern of mild LVH. Systolic function was normal. The estimated ejection fraction was in the range of 60% to 65%. There is akinesis of the mid-apicalanteroseptal and apical myocardium. The study is not technically sufficient to allow evaluation of LV diastolic function. - Aortic valve: Mildly calcified annulus.  Trileaflet; mildly thickened leaflets. - Mitral valve: Calcified annulus. There was trivial regurgitation. - Left atrium: The atrium was severely dilated. - Right atrium: The atrium was severely dilated. Central venous pressure (est): 8 mm Hg. - Tricuspid valve: There was moderate regurgitation. - Pulmonary arteries: Systolic pressure was severely increased. PA peak pressure: 84 mm Hg (S). - Pericardium, extracardiac: A trivial pericardial effusion was identified posterior to the heart.  Impressions:  - Mild LVH with LVEF 60-65%. There is akinesis of the mid to apical anteroseptal wall and apex with associated prominent trabeculation, no obvious LV mural thrombus. Indeterminate diastolic function. Severe biatrial enlargement. MAC with trivial mitral regurgitation. Mildly sclerotic aortic valve. Moderate tricuspid regurgitation with evidence of severe pulmonary hypertension, PASP estimated 84 mmHg. Trivial posterior pericardial effusion.  Assessment and Plan:  1. Multivessel CAD as post high risk DES to the circumflex in October of last year, overall poor candidate for surgery. Fortunately, she is doing reasonably well on medical therapy. No changes were made today.  2. CKD stage 3, last creatinine 1.8. She continues to follow with Dr. Luan Pulling.  3. Hyperlipidemia, remains on Lipitor without intolerances.  4. Persistent atrial fibrillation, continues on Coumadin with follow-up in the anticoagulation clinic. Recent INR therapeutic.  Current medicines were reviewed with the patient today.  Disposition: Follow-up in 6 months.  Signed, Satira Sark, MD, [facility] 05/03/2016 2:19 PM    [facility] at [facility] [address] [place], [place] [address] Phone: [phone]; Fax: [phone]

## 2016-05-03 ENCOUNTER — Encounter: Payer: Self-pay | Admitting: Cardiology

## 2016-05-03 ENCOUNTER — Ambulatory Visit (INDEPENDENT_AMBULATORY_CARE_PROVIDER_SITE_OTHER): Payer: Medicare Other | Admitting: Cardiology

## 2016-05-03 VITALS — BP 126/62 | HR 62 | Ht 64.0 in | Wt 155.0 lb

## 2016-05-03 DIAGNOSIS — N183 Chronic kidney disease, stage 3 unspecified: Secondary | ICD-10-CM

## 2016-05-03 DIAGNOSIS — I2510 Atherosclerotic heart disease of native coronary artery without angina pectoris: Secondary | ICD-10-CM | POA: Diagnosis not present

## 2016-05-03 DIAGNOSIS — E782 Mixed hyperlipidemia: Secondary | ICD-10-CM | POA: Diagnosis not present

## 2016-05-03 DIAGNOSIS — I481 Persistent atrial fibrillation: Secondary | ICD-10-CM

## 2016-05-03 DIAGNOSIS — I4819 Other persistent atrial fibrillation: Secondary | ICD-10-CM

## 2016-05-03 NOTE — Patient Instructions (Signed)
Your physician wants you to follow-up in: 6 months with Dr McDowell You will receive a reminder letter in the mail two months in advance. If you don't receive a letter, please call our office to schedule the follow-up appointment.     Your physician recommends that you continue on your current medications as directed. Please refer to the Current Medication list given to you today.    If you need a refill on your cardiac medications before your next appointment, please call your pharmacy.     Thank you for choosing [facility] !        

## 2016-06-06 DIAGNOSIS — I129 Hypertensive chronic kidney disease with stage 1 through stage 4 chronic kidney disease, or unspecified chronic kidney disease: Secondary | ICD-10-CM | POA: Diagnosis not present

## 2016-06-06 DIAGNOSIS — E1129 Type 2 diabetes mellitus with other diabetic kidney complication: Secondary | ICD-10-CM | POA: Diagnosis not present

## 2016-06-06 DIAGNOSIS — J209 Acute bronchitis, unspecified: Secondary | ICD-10-CM | POA: Diagnosis not present

## 2016-06-06 DIAGNOSIS — I5032 Chronic diastolic (congestive) heart failure: Secondary | ICD-10-CM | POA: Diagnosis not present

## 2016-06-08 ENCOUNTER — Other Ambulatory Visit: Payer: Self-pay | Admitting: Cardiology

## 2016-06-08 ENCOUNTER — Ambulatory Visit (INDEPENDENT_AMBULATORY_CARE_PROVIDER_SITE_OTHER): Payer: Medicare Other | Admitting: *Deleted

## 2016-06-08 DIAGNOSIS — I481 Persistent atrial fibrillation: Secondary | ICD-10-CM | POA: Diagnosis not present

## 2016-06-08 DIAGNOSIS — I4819 Other persistent atrial fibrillation: Secondary | ICD-10-CM

## 2016-06-08 DIAGNOSIS — Z5181 Encounter for therapeutic drug level monitoring: Secondary | ICD-10-CM | POA: Diagnosis not present

## 2016-06-08 DIAGNOSIS — I4891 Unspecified atrial fibrillation: Secondary | ICD-10-CM | POA: Diagnosis not present

## 2016-06-08 LAB — POCT INR: INR: 4

## 2016-06-22 ENCOUNTER — Ambulatory Visit (INDEPENDENT_AMBULATORY_CARE_PROVIDER_SITE_OTHER): Payer: Medicare Other | Admitting: *Deleted

## 2016-06-22 DIAGNOSIS — I481 Persistent atrial fibrillation: Secondary | ICD-10-CM

## 2016-06-22 DIAGNOSIS — I4891 Unspecified atrial fibrillation: Secondary | ICD-10-CM

## 2016-06-22 DIAGNOSIS — I4819 Other persistent atrial fibrillation: Secondary | ICD-10-CM

## 2016-06-22 DIAGNOSIS — Z5181 Encounter for therapeutic drug level monitoring: Secondary | ICD-10-CM

## 2016-06-22 LAB — POCT INR: INR: 2.7

## 2016-07-09 ENCOUNTER — Encounter (HOSPITAL_COMMUNITY): Payer: Self-pay | Admitting: *Deleted

## 2016-07-09 ENCOUNTER — Emergency Department (HOSPITAL_COMMUNITY)
Admission: EM | Admit: 2016-07-09 | Discharge: 2016-07-09 | Disposition: A | Discharge status: Home / Self Care | Payer: Medicare Other | Attending: Emergency Medicine | Admitting: Emergency Medicine

## 2016-07-09 DIAGNOSIS — I130 Hypertensive heart and chronic kidney disease with heart failure and stage 1 through stage 4 chronic kidney disease, or unspecified chronic kidney disease: Secondary | ICD-10-CM | POA: Insufficient documentation

## 2016-07-09 DIAGNOSIS — R338 Other retention of urine: Secondary | ICD-10-CM

## 2016-07-09 DIAGNOSIS — Z7902 Long term (current) use of antithrombotics/antiplatelets: Secondary | ICD-10-CM | POA: Insufficient documentation

## 2016-07-09 DIAGNOSIS — Z7984 Long term (current) use of oral hypoglycemic drugs: Secondary | ICD-10-CM | POA: Insufficient documentation

## 2016-07-09 DIAGNOSIS — N183 Chronic kidney disease, stage 3 (moderate): Secondary | ICD-10-CM | POA: Diagnosis not present

## 2016-07-09 DIAGNOSIS — I509 Heart failure, unspecified: Secondary | ICD-10-CM | POA: Diagnosis not present

## 2016-07-09 DIAGNOSIS — Z79899 Other long term (current) drug therapy: Secondary | ICD-10-CM | POA: Insufficient documentation

## 2016-07-09 DIAGNOSIS — E1122 Type 2 diabetes mellitus with diabetic chronic kidney disease: Secondary | ICD-10-CM | POA: Diagnosis not present

## 2016-07-09 DIAGNOSIS — R339 Retention of urine, unspecified: Secondary | ICD-10-CM | POA: Insufficient documentation

## 2016-07-09 DIAGNOSIS — R1084 Generalized abdominal pain: Secondary | ICD-10-CM | POA: Diagnosis present

## 2016-07-09 DIAGNOSIS — I2510 Atherosclerotic heart disease of native coronary artery without angina pectoris: Secondary | ICD-10-CM | POA: Diagnosis not present

## 2016-07-09 LAB — URINALYSIS, ROUTINE W REFLEX MICROSCOPIC
Bilirubin Urine: NEGATIVE
Glucose, UA: >=500 mg/dL — AB
Ketones, ur: NEGATIVE mg/dL
Leukocytes, UA: NEGATIVE
Nitrite: NEGATIVE
PH: 6 (ref 5.0–8.0)
Protein, ur: 30 mg/dL — AB
SPECIFIC GRAVITY, URINE: 1.009 (ref 1.005–1.030)
SQUAMOUS EPITHELIAL / LPF: NONE SEEN

## 2016-07-09 NOTE — ED Triage Notes (Signed)
Pt c/o abdominal pain since yesterday; pt states she has not been able to urinate but a few drops since yesterday

## 2016-07-09 NOTE — ED Notes (Signed)
Leg bag placed on pt to go home 

## 2016-07-09 NOTE — ED Provider Notes (Signed)
[facility]-EMERGENCY DEPT Provider Note   CSN: [id number] Arrival date & time: 07/09/16  0513     History   Chief Complaint Chief Complaint  Patient presents with  . Abdominal Pain    HPI Melissa [name] is a 83 y.o. female.  The history is provided by the patient.  She comes in complaining of generalized abdominal pain which started yesterday. She also has not been able to urinate since yesterday. There is been no fever or chills. There has been no nausea or vomiting. She denies constipation or diarrhea. Nothing makes pain better nothing makes it worse.  Past Medical History:  Diagnosis Date  . Atrial fibrillation (HCC)   . CAD (coronary artery disease)    Multivessel status post high risk PCI/DES to circumflex 11/2015 (poor candidate for CABG)  . CKD (chronic kidney disease) stage 3, GFR 30-59 ml/min   . DDD (degenerative disc disease), lumbar   . Essential hypertension, benign   . Mixed hyperlipidemia   . Type 2 diabetes mellitus [facility](HCC)     Patient Active Problem List   Diagnosis Date Noted  . Status post coronary artery stent placement   . Anemia of chronic disease 12/03/2015  . Bradycardia 12/01/2015  . CAD S/P high risk PCI 12/01/15 12/01/2015  . NSTEMI (non-ST elevated myocardial infarction) (HCC)   . Pulmonary hypertension (HCC)   . Chronic kidney disease (CKD), stage IV (severe) (HCC) 11/24/2015  . Unstable angina (HCC) 11/22/2015  . Chest pain 11/10/2015  . Angina at rest [facility](HCC) 11/10/2015  . Acute CHF (congestive heart failure) (HCC) 11/10/2015  . Chronic venous insufficiency 02/19/2015  . CHF (congestive heart failure) (HCC) 02/17/2015  . Tricuspid valve regurgitation 02/17/2015  . Encounter for therapeutic drug monitoring 03/11/2013  . Long term current use of anticoagulant 05/05/2010  . Mixed hyperlipidemia 09/30/2009  . Essential hypertension, benign 09/24/2008  . Persistent atrial fibrillation (HCC) 09/24/2008    Past Surgical History:  Procedure  Laterality Date  . CARDIAC CATHETERIZATION N/A 11/26/2015   Procedure: Right/Left Heart Cath and Coronary Angiography;  Surgeon: Yvonne Kendallhristopher End, MD;  [facility];  Service: Cardiovascular;  Laterality: N/A;  . CARDIAC CATHETERIZATION N/A 12/01/2015   Procedure: Coronary Stent Intervention Rotoblader;  Surgeon: Tonny BollmanMichael Cooper, MD;  [facility];  Service: Cardiovascular;  Laterality: N/A;  . TOTAL HIP ARTHROPLASTY  01/14/03   Left    OB History    No data available       Home Medications    Prior to Admission medications   Medication Sig Start Date End Date Taking? Authorizing Provider  acetaminophen (TYLENOL) 325 MG tablet Take 2 tablets (650 mg total) by mouth every 4 (four) hours as needed for mild pain, moderate pain, fever or headache. 12/05/15  Yes Kilroy, Luke K, PA-C  ALPRAZolam (XANAX) 0.25 MG tablet Take 0.25 mg by mouth 3 (three) times daily as needed. 11/19/15  Yes [provider]  amLODipine (NORVASC) 2.5 MG tablet TAKE (1) TABLET BY MOUTH DAILY. 06/08/16  Yes Jonelle SidleMcDowell, Samuel G, MD  atorvastatin (LIPITOR) 40 MG tablet Take 1 tablet (40 mg total) by mouth daily. 12/06/15  Yes Kilroy, Eda PaschalLuke K, PA-C  clopidogrel (PLAVIX) 75 MG tablet Take 1 tablet (75 mg total) by mouth daily. 12/06/15  Yes Kilroy, Luke K, PA-C  glimepiride (AMARYL) 2 MG tablet Take 2 mg by mouth daily.     Yes [provider]  hydrALAZINE (APRESOLINE) 100 MG tablet Take 1 tablet (100 mg total) by  mouth 3 (three) times daily. 12/05/15  Yes Kilroy, Eda Paschal, PA-C  HYDROcodone-acetaminophen (NORCO/VICODIN) 5-325 MG tablet Take 1 tablet by mouth 4 (four) times daily.  09/10/14  Yes [provider]  isosorbide mononitrate (IMDUR) 60 MG 24 hr tablet Take 1 tablet (60 mg total) by mouth daily. 12/06/15  Yes Kilroy, Luke K, PA-C  metoprolol tartrate (LOPRESSOR) 25 MG tablet Take 0.5 tablets (12.5 mg total) by mouth 2 (two) times daily. 12/05/15  Yes Kilroy, Luke K,  PA-C  nitroGLYCERIN (NITROSTAT) 0.4 MG SL tablet Place 1 tablet (0.4 mg total) under the tongue every 5 (five) minutes as needed for chest pain. 12/05/15  Yes Kilroy, Luke K, PA-C  pantoprazole (PROTONIX) 40 MG tablet Take 1 tablet (40 mg total) by mouth daily at 6 (six) AM. 12/06/15  Yes Kilroy, Eda Paschal, PA-C  solifenacin (VESICARE) 5 MG tablet Take 5 mg by mouth daily.   Yes [provider]  torsemide (DEMADEX) 20 MG tablet Take 1 tablet (20 mg total) by mouth 2 (two) times daily. 02/25/15  Yes Kari Baars, MD  TRAVATAN Z 0.004 % SOLN ophthalmic solution Place 1 drop into both eyes at bedtime.  11/08/14  Yes [provider]  warfarin (COUMADIN) 5 MG tablet Take 1 tablet daily except 1/2 tablet on Wednesdays and Saturdays or as directed 01/04/16  Yes Jonelle Sidle, MD    Family History Family History  Problem Relation Age of Onset  . Diabetes type II Mother   . Diabetes type II Father   . Hypertension Father     Social History Social History  Substance Use Topics  . Smoking status: Never Smoker  . Smokeless tobacco: Never Used  . Alcohol use No     Allergies   Patient has no known allergies.   Review of Systems Review of Systems  All other systems reviewed and are negative.    Physical Exam Updated Vital Signs BP (!) 177/130 (BP Location: Left Arm)   Pulse 84   Temp 98 F (36.7 C) (Oral)   Resp 16   Ht 5\' 4"  (1.626 m)   Wt 79.8 kg (176 lb)   SpO2 96%   BMI 30.21 kg/m   Physical Exam  Nursing note and vitals reviewed.  83 year old female, resting comfortably and in no acute distress. Vital signs are significant for hypertension. Oxygen saturation is 96%, which is normal. Head is normocephalic and atraumatic. PERRLA, EOMI. Oropharynx is clear. Neck is nontender and supple without adenopathy or JVD. Back is nontender and there is no CVA tenderness. Lungs are clear without rales, wheezes, or rhonchi. Chest is nontender. Heart has regular  rate and rhythm without murmur. Abdomen is soft, flat, nontender without hepatosplenomegaly and peristalsis is normoactive. Bladder is massively distended to above the umbilicus. Extremities have no cyanosis or edema, full range of motion is present. Skin is warm and dry without rash. Neurologic: Mental status is normal, cranial nerves are intact, there are no motor or sensory deficits.  ED Treatments / Results  Labs (all labs ordered are listed, but only abnormal results are displayed) Labs Reviewed - No data to display  Procedures BLADDER CATHETERIZATION Date/Time: 07/09/2016 5:48 AM Performed by: Dione Booze Authorized by: Preston Fleeting, Loranzo Desha   Consent:    Consent obtained:  Verbal   Consent given by:  Patient   Risks discussed:  False passage, urethral injury, infection and pain   Alternatives discussed:  No treatment, delayed treatment and referral Pre-procedure details:  Procedure purpose:  Therapeutic   Preparation: Patient was prepped and draped in usual sterile fashion   Anesthesia (see MAR for exact dosages):    Anesthesia method:  None Procedure details:    Provider performed due to:  Nurse unable to complete   Catheter insertion:  Indwelling   Catheter size:  16 Fr   Bladder irrigation: no     Number of attempts:  1   Urine characteristics:  Clear Post-procedure details:    Patient tolerance of procedure:  Tolerated well, no immediate complications Comments:     Urethra located further into the vaginal canal then would be expected.    Labs (all labs ordered are listed, but only abnormal results are displayed) Results for orders placed or performed during the hospital encounter of 07/09/16  Urinalysis, Routine w reflex microscopic  Result Value Ref Range   Color, Urine STRAW (A) YELLOW   APPearance CLEAR CLEAR   Specific Gravity, Urine 1.009 1.005 - 1.030   pH 6.0 5.0 - 8.0   Glucose, UA >=500 (A) NEGATIVE mg/dL   Hgb urine dipstick MODERATE (A) NEGATIVE    Bilirubin Urine NEGATIVE NEGATIVE   Ketones, ur NEGATIVE NEGATIVE mg/dL   Protein, ur 30 (A) NEGATIVE mg/dL   Nitrite NEGATIVE NEGATIVE   Leukocytes, UA NEGATIVE NEGATIVE   RBC / HPF 6-30 0 - 5 RBC/hpf   WBC, UA 0-5 0 - 5 WBC/hpf   Bacteria, UA RARE (A) NONE SEEN   Squamous Epithelial / LPF NONE SEEN NONE SEEN    Procedures: (including critical care time)   Medications Ordered in ED Medications - No data to display   Initial Impression / Assessment and Plan / ED Course  I have reviewed the triage vital signs and the nursing notes.  Pertinent lab results that were available during my care of the patient were reviewed by me and considered in my medical decision making (see chart for details).  Acute urinary retention. I suspect abdominal pain and hypertension are secondary to this. Urinary catheter was placed by myself and drained clear urine. We'll send for urinalysis and culture and will assess patient's pain and blood pressure once bladder has been decompressed. Old records are reviewed, and she has no relevant past visits.  Foley catheter drained 1650 mL of urine. Urinalysis shows no evidence of infection, but specimen was sent for culture. She is discharged with routine Foley care instructions. Also, of note, blood pressure has come down and abdominal pain has resolved with draining the bladder.  Final Clinical Impressions(s) / ED Diagnoses   Final diagnoses:  Acute urinary retention    New Prescriptions New Prescriptions   No medications on file     Dione Booze, MD 07/09/16 [phone]

## 2016-07-10 LAB — URINE CULTURE: CULTURE: NO GROWTH

## 2016-07-11 ENCOUNTER — Other Ambulatory Visit: Payer: Self-pay | Admitting: Cardiology

## 2016-07-19 ENCOUNTER — Ambulatory Visit (INDEPENDENT_AMBULATORY_CARE_PROVIDER_SITE_OTHER): Payer: Medicare Other | Admitting: Urology

## 2016-07-19 DIAGNOSIS — R3914 Feeling of incomplete bladder emptying: Secondary | ICD-10-CM | POA: Diagnosis not present

## 2016-07-20 ENCOUNTER — Other Ambulatory Visit: Payer: Self-pay

## 2016-07-20 ENCOUNTER — Encounter (HOSPITAL_COMMUNITY): Payer: Self-pay | Admitting: *Deleted

## 2016-07-20 ENCOUNTER — Emergency Department (HOSPITAL_COMMUNITY): Payer: Medicare Other

## 2016-07-20 ENCOUNTER — Inpatient Hospital Stay (HOSPITAL_COMMUNITY)
Admission: EM | Admit: 2016-07-20 | Discharge: 2016-07-26 | DRG: 637 | Disposition: A | Discharge status: Home / Self Care | Payer: Medicare Other | Attending: Pulmonary Disease | Admitting: Pulmonary Disease

## 2016-07-20 DIAGNOSIS — S6291XA Unspecified fracture of right wrist and hand, initial encounter for closed fracture: Secondary | ICD-10-CM | POA: Diagnosis present

## 2016-07-20 DIAGNOSIS — I482 Chronic atrial fibrillation: Secondary | ICD-10-CM | POA: Diagnosis present

## 2016-07-20 DIAGNOSIS — E1101 Type 2 diabetes mellitus with hyperosmolarity with coma: Secondary | ICD-10-CM | POA: Diagnosis not present

## 2016-07-20 DIAGNOSIS — D638 Anemia in other chronic diseases classified elsewhere: Secondary | ICD-10-CM | POA: Diagnosis present

## 2016-07-20 DIAGNOSIS — I4891 Unspecified atrial fibrillation: Secondary | ICD-10-CM | POA: Diagnosis not present

## 2016-07-20 DIAGNOSIS — Z96642 Presence of left artificial hip joint: Secondary | ICD-10-CM | POA: Diagnosis not present

## 2016-07-20 DIAGNOSIS — E860 Dehydration: Secondary | ICD-10-CM | POA: Diagnosis present

## 2016-07-20 DIAGNOSIS — I509 Heart failure, unspecified: Secondary | ICD-10-CM | POA: Diagnosis not present

## 2016-07-20 DIAGNOSIS — H9190 Unspecified hearing loss, unspecified ear: Secondary | ICD-10-CM | POA: Diagnosis present

## 2016-07-20 DIAGNOSIS — R739 Hyperglycemia, unspecified: Secondary | ICD-10-CM

## 2016-07-20 DIAGNOSIS — S0990XA Unspecified injury of head, initial encounter: Secondary | ICD-10-CM | POA: Diagnosis not present

## 2016-07-20 DIAGNOSIS — N184 Chronic kidney disease, stage 4 (severe): Secondary | ICD-10-CM | POA: Diagnosis present

## 2016-07-20 DIAGNOSIS — E1165 Type 2 diabetes mellitus with hyperglycemia: Secondary | ICD-10-CM | POA: Diagnosis not present

## 2016-07-20 DIAGNOSIS — S79911A Unspecified injury of right hip, initial encounter: Secondary | ICD-10-CM | POA: Diagnosis not present

## 2016-07-20 DIAGNOSIS — I5033 Acute on chronic diastolic (congestive) heart failure: Secondary | ICD-10-CM

## 2016-07-20 DIAGNOSIS — E1100 Type 2 diabetes mellitus with hyperosmolarity without nonketotic hyperglycemic-hyperosmolar coma (NKHHC): Secondary | ICD-10-CM | POA: Diagnosis not present

## 2016-07-20 DIAGNOSIS — E869 Volume depletion, unspecified: Secondary | ICD-10-CM | POA: Diagnosis present

## 2016-07-20 DIAGNOSIS — Z8249 Family history of ischemic heart disease and other diseases of the circulatory system: Secondary | ICD-10-CM | POA: Diagnosis not present

## 2016-07-20 DIAGNOSIS — I4819 Other persistent atrial fibrillation: Secondary | ICD-10-CM | POA: Diagnosis present

## 2016-07-20 DIAGNOSIS — I481 Persistent atrial fibrillation: Secondary | ICD-10-CM | POA: Diagnosis present

## 2016-07-20 DIAGNOSIS — M25551 Pain in right hip: Secondary | ICD-10-CM | POA: Diagnosis not present

## 2016-07-20 DIAGNOSIS — R001 Bradycardia, unspecified: Secondary | ICD-10-CM | POA: Diagnosis present

## 2016-07-20 DIAGNOSIS — I130 Hypertensive heart and chronic kidney disease with heart failure and stage 1 through stage 4 chronic kidney disease, or unspecified chronic kidney disease: Secondary | ICD-10-CM | POA: Diagnosis not present

## 2016-07-20 DIAGNOSIS — E1129 Type 2 diabetes mellitus with other diabetic kidney complication: Secondary | ICD-10-CM | POA: Diagnosis not present

## 2016-07-20 DIAGNOSIS — Z7902 Long term (current) use of antithrombotics/antiplatelets: Secondary | ICD-10-CM

## 2016-07-20 DIAGNOSIS — I272 Pulmonary hypertension, unspecified: Secondary | ICD-10-CM | POA: Diagnosis not present

## 2016-07-20 DIAGNOSIS — S299XXA Unspecified injury of thorax, initial encounter: Secondary | ICD-10-CM | POA: Diagnosis not present

## 2016-07-20 DIAGNOSIS — M5136 Other intervertebral disc degeneration, lumbar region: Secondary | ICD-10-CM | POA: Diagnosis not present

## 2016-07-20 DIAGNOSIS — E119 Type 2 diabetes mellitus without complications: Secondary | ICD-10-CM | POA: Diagnosis present

## 2016-07-20 DIAGNOSIS — Z9861 Coronary angioplasty status: Secondary | ICD-10-CM | POA: Diagnosis not present

## 2016-07-20 DIAGNOSIS — M79642 Pain in left hand: Secondary | ICD-10-CM | POA: Diagnosis not present

## 2016-07-20 DIAGNOSIS — W19XXXA Unspecified fall, initial encounter: Secondary | ICD-10-CM | POA: Diagnosis present

## 2016-07-20 DIAGNOSIS — E11649 Type 2 diabetes mellitus with hypoglycemia without coma: Secondary | ICD-10-CM | POA: Diagnosis not present

## 2016-07-20 DIAGNOSIS — Z7901 Long term (current) use of anticoagulants: Secondary | ICD-10-CM

## 2016-07-20 DIAGNOSIS — S62113A Displaced fracture of triquetrum [cuneiform] bone, unspecified wrist, initial encounter for closed fracture: Secondary | ICD-10-CM | POA: Diagnosis present

## 2016-07-20 DIAGNOSIS — N179 Acute kidney failure, unspecified: Secondary | ICD-10-CM | POA: Diagnosis present

## 2016-07-20 DIAGNOSIS — I10 Essential (primary) hypertension: Secondary | ICD-10-CM | POA: Diagnosis not present

## 2016-07-20 DIAGNOSIS — E1122 Type 2 diabetes mellitus with diabetic chronic kidney disease: Secondary | ICD-10-CM | POA: Diagnosis not present

## 2016-07-20 DIAGNOSIS — R3914 Feeling of incomplete bladder emptying: Secondary | ICD-10-CM | POA: Diagnosis not present

## 2016-07-20 DIAGNOSIS — M79641 Pain in right hand: Secondary | ICD-10-CM | POA: Diagnosis not present

## 2016-07-20 DIAGNOSIS — E870 Hyperosmolality and hypernatremia: Secondary | ICD-10-CM | POA: Diagnosis present

## 2016-07-20 DIAGNOSIS — I071 Rheumatic tricuspid insufficiency: Secondary | ICD-10-CM | POA: Diagnosis present

## 2016-07-20 DIAGNOSIS — Z833 Family history of diabetes mellitus: Secondary | ICD-10-CM

## 2016-07-20 DIAGNOSIS — I2510 Atherosclerotic heart disease of native coronary artery without angina pectoris: Secondary | ICD-10-CM | POA: Diagnosis present

## 2016-07-20 DIAGNOSIS — G253 Myoclonus: Secondary | ICD-10-CM | POA: Diagnosis not present

## 2016-07-20 DIAGNOSIS — E782 Mixed hyperlipidemia: Secondary | ICD-10-CM | POA: Diagnosis not present

## 2016-07-20 DIAGNOSIS — M545 Low back pain: Secondary | ICD-10-CM | POA: Diagnosis not present

## 2016-07-20 DIAGNOSIS — R52 Pain, unspecified: Secondary | ICD-10-CM

## 2016-07-20 DIAGNOSIS — M79671 Pain in right foot: Secondary | ICD-10-CM | POA: Diagnosis not present

## 2016-07-20 DIAGNOSIS — R339 Retention of urine, unspecified: Secondary | ICD-10-CM | POA: Diagnosis present

## 2016-07-20 DIAGNOSIS — S3992XA Unspecified injury of lower back, initial encounter: Secondary | ICD-10-CM | POA: Diagnosis not present

## 2016-07-20 LAB — CBC WITH DIFFERENTIAL/PLATELET
Basophils Absolute: 0 10*3/uL (ref 0.0–0.1)
Basophils Relative: 0 %
Eosinophils Absolute: 0 10*3/uL (ref 0.0–0.7)
Eosinophils Relative: 0 %
HCT: 35.9 % — ABNORMAL LOW (ref 36.0–46.0)
Hemoglobin: 11.9 g/dL — ABNORMAL LOW (ref 12.0–15.0)
Lymphocytes Relative: 7 %
Lymphs Abs: 0.7 10*3/uL (ref 0.7–4.0)
MCH: 31.3 pg (ref 26.0–34.0)
MCHC: 33.1 g/dL (ref 30.0–36.0)
MCV: 94.5 fL (ref 78.0–100.0)
Monocytes Absolute: 0.7 10*3/uL (ref 0.1–1.0)
Monocytes Relative: 6 %
Neutro Abs: 9.6 10*3/uL — ABNORMAL HIGH (ref 1.7–7.7)
Neutrophils Relative %: 87 %
Platelets: 172 10*3/uL (ref 150–400)
RBC: 3.8 MIL/uL — ABNORMAL LOW (ref 3.87–5.11)
RDW: 13.6 % (ref 11.5–15.5)
WBC: 10.9 10*3/uL — ABNORMAL HIGH (ref 4.0–10.5)

## 2016-07-20 LAB — BASIC METABOLIC PANEL
Anion gap: 16 — ABNORMAL HIGH (ref 5–15)
BUN: 65 mg/dL — ABNORMAL HIGH (ref 6–20)
CO2: 23 mmol/L (ref 22–32)
Calcium: 8.5 mg/dL — ABNORMAL LOW (ref 8.9–10.3)
Chloride: 82 mmol/L — ABNORMAL LOW (ref 101–111)
Creatinine, Ser: 2.77 mg/dL — ABNORMAL HIGH (ref 0.44–1.00)
GFR calc Af Amer: 17 mL/min — ABNORMAL LOW (ref >60)
GFR calc non Af Amer: 15 mL/min — ABNORMAL LOW (ref >60)
Glucose, Bld: 1183 mg/dL (ref 65–99)
Potassium: 5.5 mmol/L — ABNORMAL HIGH (ref 3.5–5.1)
Sodium: 121 mmol/L — ABNORMAL LOW (ref 135–145)

## 2016-07-20 LAB — MAGNESIUM: Magnesium: 2.3 mg/dL (ref 1.7–2.4)

## 2016-07-20 LAB — PROTIME-INR
INR: >4.01
Prothrombin Time: 44.9 seconds — ABNORMAL HIGH (ref 11.4–15.2)

## 2016-07-20 MED ORDER — LORAZEPAM 1 MG PO TABS
1.0000 mg | ORAL_TABLET | Freq: Once | ORAL | Status: AC
  Administered 2016-07-20: 1 mg via ORAL
  Filled 2016-07-20: qty 1

## 2016-07-20 MED ORDER — SODIUM CHLORIDE 0.9 % IV SOLN
INTRAVENOUS | Status: DC
  Administered 2016-07-21: 20.5 [IU]/h via INTRAVENOUS
  Administered 2016-07-21: 4.4 [IU]/h via INTRAVENOUS
  Filled 2016-07-20: qty 1

## 2016-07-20 MED ORDER — SODIUM CHLORIDE 0.9 % IV SOLN
INTRAVENOUS | Status: AC
  Filled 2016-07-20: qty 1

## 2016-07-20 MED ORDER — HYDROCODONE-ACETAMINOPHEN 5-325 MG PO TABS
1.0000 | ORAL_TABLET | Freq: Once | ORAL | Status: AC
  Administered 2016-07-20: 1 via ORAL
  Filled 2016-07-20: qty 1

## 2016-07-20 MED ORDER — SODIUM CHLORIDE 0.9 % IV BOLUS (SEPSIS)
2000.0000 mL | Freq: Once | INTRAVENOUS | Status: AC
  Administered 2016-07-20: 2000 mL via INTRAVENOUS

## 2016-07-20 NOTE — ED Notes (Signed)
This nurse helped patient to the restroom. While there, pt make sudden jerking movement. Pt was alert at this time

## 2016-07-20 NOTE — ED Notes (Signed)
Pt unable to ambulate with assistance. She has spastic movement of the right side, Dr Juleen ChinaKohut notified.

## 2016-07-20 NOTE — H&P (Signed)
History and Physical    Melissa [name] ZOX:[id number] DOB: 08/15/1930 DOA: 07/20/2016  PCP: Kari Baars, MD  Patient coming from: Home.    Chief Complaint:   Sherran Needs.   HPI: Melissa [name] is an 83 y.o. female with hx of afib on anticoagulation, known CAD, DM, HTN, HLD, presented to the ER after feeling weak and fell tonight.  In the ER, she was having right upper extremity myoclonic jerks, but did not lose consciousness, and conversed normally.  She recognized her daughter and answered questions appropriately.  She denied CP, SOB, diarrhea, fever or chills.  Work up in the ER showed BS 1200, with concomitant Na of 129, K of 5.5 mE/L and normal Calcium.  CT of the head, xray LS, and pelvic Xrays were negative for Fx.  Her hand xray showed possible triquitrium Fx, and it was placed in a sling.  Her AG was only 16, with normal bicarb and elevated BUN Cr to 65 and 2.77.  Her EKG showed no prolongation of her QTc, and only non specific ST T changes.  Hospitalist was asked to admit her for hyperosmolar, severe volume depletion, pre renal AKI, and fall with hand fracture.  She is a full code.   ED Course:  See above.  Rewiew of Systems: Unable to obtain reliable ROS.   Past Medical History:  Diagnosis Date  . Atrial fibrillation (HCC)   . CAD (coronary artery disease)    Multivessel status post high risk PCI/DES to circumflex 11/2015 (poor candidate for CABG)  . CKD (chronic kidney disease) stage 3, GFR 30-59 ml/min   . DDD (degenerative disc disease), lumbar   . Essential hypertension, benign   . Mixed hyperlipidemia   . Type 2 diabetes mellitus (HCC)     Past Surgical History:  Procedure Laterality Date  . CARDIAC CATHETERIZATION N/A 11/26/2015   Procedure: Right/Left Heart Cath and Coronary Angiography;  Surgeon: Yvonne Kendall, MD;  Location: [facility] INVASIVE CV LAB;  Service: Cardiovascular;  Laterality: N/A;  . CARDIAC CATHETERIZATION N/A 12/01/2015   Procedure: Coronary  Stent Intervention Rotoblader;  Surgeon: Tonny Bollman, MD;  Location: [facility] INVASIVE CV LAB;  Service: Cardiovascular;  Laterality: N/A;  . TOTAL HIP ARTHROPLASTY  01/14/03   Left     reports that she has never smoked. She has never used smokeless tobacco. She reports that she does not drink alcohol or use drugs.  No Known Allergies  Family History  Problem Relation Age of Onset  . Diabetes type II Mother   . Diabetes type II Father   . Hypertension Father      Prior to Admission medications   Medication Sig Start Date End Date Taking? Authorizing Provider  acetaminophen (TYLENOL) 325 MG tablet Take 2 tablets (650 mg total) by mouth every 4 (four) hours as needed for mild pain, moderate pain, fever or headache. 12/05/15  Yes Kilroy, Luke K, PA-C  ALPRAZolam (XANAX) 0.25 MG tablet Take 0.25 mg by mouth 3 (three) times daily as needed. 11/19/15  Yes [provider]  amLODipine (NORVASC) 2.5 MG tablet TAKE (1) TABLET BY MOUTH DAILY. 07/11/16  Yes Jonelle Sidle, MD  atorvastatin (LIPITOR) 40 MG tablet Take 1 tablet (40 mg total) by mouth daily. 12/06/15  Yes Kilroy, Eda Paschal, PA-C  clopidogrel (PLAVIX) 75 MG tablet Take 1 tablet (75 mg total) by mouth daily. 12/06/15  Yes Kilroy, Luke K, PA-C  glimepiride (AMARYL) 2 MG tablet Take 2 mg by mouth daily.  Yes [provider]  hydrALAZINE (APRESOLINE) 100 MG tablet Take 1 tablet (100 mg total) by mouth 3 (three) times daily. 12/05/15  Yes Kilroy, Eda Paschal, PA-C  HYDROcodone-acetaminophen (NORCO/VICODIN) 5-325 MG tablet Take 1 tablet by mouth 4 (four) times daily.  09/10/14  Yes [provider]  isosorbide mononitrate (IMDUR) 60 MG 24 hr tablet Take 1 tablet (60 mg total) by mouth daily. 12/06/15  Yes Kilroy, Luke K, PA-C  metoprolol tartrate (LOPRESSOR) 25 MG tablet Take 0.5 tablets (12.5 mg total) by mouth 2 (two) times daily. 12/05/15  Yes Kilroy, Luke K, PA-C  nitroGLYCERIN (NITROSTAT) 0.4 MG SL tablet Place 1 tablet  (0.4 mg total) under the tongue every 5 (five) minutes as needed for chest pain. 12/05/15  Yes Kilroy, Luke K, PA-C  pantoprazole (PROTONIX) 40 MG tablet Take 1 tablet (40 mg total) by mouth daily at 6 (six) AM. 12/06/15  Yes Kilroy, Eda Paschal, PA-C  torsemide (DEMADEX) 20 MG tablet Take 1 tablet (20 mg total) by mouth 2 (two) times daily. 02/25/15  Yes Kari Baars, MD  TRAVATAN Z 0.004 % SOLN ophthalmic solution Place 1 drop into both eyes at bedtime.  11/08/14  Yes [provider]  warfarin (COUMADIN) 5 MG tablet TAKE 1 TABLET BY MOUTH DAILY EXCEPT 1/2 TABLET ON WEDNESDAYS AND SATURDAYS. 07/11/16  Yes Jonelle Sidle, MD    Physical Exam: Vitals:   07/20/16 2148 07/20/16 2240 07/20/16 2242 07/21/16 0030  BP:  (!) 120/96 (!) 120/96 135/63  Pulse:  66 69   Resp:  12 (!) 21 15  Temp: 98.1 F (36.7 C)  98.1 F (36.7 C)   TempSrc:   Oral   SpO2:  100% 97%   Weight:      Height:          Constitutional: NAD, calm, comfortable Vitals:   07/20/16 2148 07/20/16 2240 07/20/16 2242 07/21/16 0030  BP:  (!) 120/96 (!) 120/96 135/63  Pulse:  66 69   Resp:  12 (!) 21 15  Temp: 98.1 F (36.7 C)  98.1 F (36.7 C)   TempSrc:   Oral   SpO2:  100% 97%   Weight:      Height:       Eyes: PERRL, lids and conjunctivae normal ENMT: Mucous membranes are moist. Posterior pharynx clear of any exudate or lesions.Normal dentition.  Neck: normal, supple, no masses, no thyromegaly Respiratory: clear to auscultation bilaterally, no wheezing, no crackles. Normal respiratory effort. No accessory muscle use.  Cardiovascular: Regular rate and rhythm, no murmurs / rubs / gallops. No extremity edema. 2+ pedal pulses. No carotid bruits.  Abdomen: no tenderness, no masses palpated. No hepatosplenomegaly. Bowel sounds positive.  Musculoskeletal: no clubbing / cyanosis. No joint deformity upper and lower extremities. Good ROM, no contractures. Normal muscle tone.  Skin: no rashes, lesions, ulcers. No  induration Neurologic: CN 2-12 grossly intact. Sensation intact, DTR normal. Strength 5/5 in all 4.  Psychiatric: Normal judgment and insight. Alert and oriented x 3. Normal mood.    Labs on Admission: I have personally reviewed following labs and imaging studies  CBC:  Recent Labs Lab 07/20/16 2142  WBC 10.9*  NEUTROABS 9.6*  HGB 11.9*  HCT 35.9*  MCV 94.5  PLT 172   Basic Metabolic Panel:  Recent Labs Lab 07/20/16 2142  NA 121*  K 5.5*  CL 82*  CO2 23  GLUCOSE 1,183*  BUN 65*  CREATININE 2.77*  CALCIUM 8.5*  MG 2.3  Coagulation Profile:  Recent Labs Lab 07/20/16 2142  INR >4.01*   Urine analysis:    Component Value Date/Time   COLORURINE STRAW (A) 07/09/2016 0545   APPEARANCEUR CLEAR 07/09/2016 0545   LABSPEC 1.009 07/09/2016 0545   PHURINE 6.0 07/09/2016 0545   GLUCOSEU >=500 (A) 07/09/2016 0545   HGBUR MODERATE (A) 07/09/2016 0545   BILIRUBINUR NEGATIVE 07/09/2016 0545   KETONESUR NEGATIVE 07/09/2016 0545   PROTEINUR 30 (A) 07/09/2016 0545   NITRITE NEGATIVE 07/09/2016 0545   LEUKOCYTESUR NEGATIVE 07/09/2016 0545   Radiological Exams on Admission: Dg Lumbar Spine Complete  Result Date: 07/20/2016 CLINICAL DATA:  Fall with pain EXAM: LUMBAR SPINE - COMPLETE 4+ VIEW COMPARISON:  None. FINDINGS: Large calcified pelvic masses. Aortic atherosclerosis. Limited details on the lateral views. Suspect that there may be grade 1 anterolisthesis of L4 on L5. Vertebral body heights are grossly maintained. Moderate-to-marked degenerative disc changes at L2-L3 and L3-L4. Moderate changes at L5-S1 and L1-L2. IMPRESSION: Degenerative changes.  No definite acute osseous abnormality. Electronically Signed   By: Jasmine PangKim  Fujinaga M.D.   On: 07/20/2016 18:59   Ct Head Wo Contrast  Result Date: 07/20/2016 CLINICAL DATA:  Spastic movement of right arm, history of a fall EXAM: CT HEAD WITHOUT CONTRAST TECHNIQUE: Contiguous axial images were obtained from the base of the skull  through the vertex without intravenous contrast. COMPARISON:  None. FINDINGS: Brain: Motion artifact limits the exam. No acute territorial infarction, hemorrhage or intracranial mass is seen. Mild to moderate periventricular and deep white matter small vessel ischemic changes. Mild atrophy. Ventricles within normal limits. Vascular: No hyperdense vessels.  Carotid artery calcifications. Skull: No fracture or suspicious bone lesion Sinuses/Orbits: No acute finding. Other: None IMPRESSION: The examination is slightly degraded by patient motion. No acute intracranial abnormality. Atrophy and small vessel ischemic changes of the white matter. Electronically Signed   By: Jasmine PangKim  Fujinaga M.D.   On: 07/20/2016 22:35   Ct Hip Right Wo Contrast  Result Date: 07/20/2016 CLINICAL DATA:  Right hip pain.  Fall today. EXAM: CT OF THE RIGHT HIP WITHOUT CONTRAST TECHNIQUE: Multidetector CT imaging of the right hip was performed according to the standard protocol. Multiplanar CT image reconstructions were also generated. COMPARISON:  12/19/2002 and 07/20/2016 radiographs FINDINGS: Despite efforts by the technologist and patient, motion artifact is present on today's exam and could not be eliminated. This reduces exam sensitivity and specificity. Bones/Joint/Cartilage Cortical thickening and trabecular thickening in the right iliac bone, acetabulum, and ischium characteristic of Paget' s disease of bone. Mild sclerosis along the right sacral ala adjacent to the sacroiliac joint suggesting mild sacroiliitis. Spurring of the right femoral head and acetabulum. Accounting for the motion artifact as best I can, I do not observe a definite fracture involving the right proximal femur or regional right hemipelvis. Moderate loss of chondral thickness in the right hip. Faint chondrocalcinosis. Subtle irregularity of the sacrococcygeal junction anteriorly for example on image 14/8, this could be chronic. Ligaments Suboptimally assessed by CT.  Muscles and Tendons We partially include a lipoma of the right semi tendinosis muscle. Soft tissues Iliac and femoral artery atherosclerotic calcification. Subcutaneous edema overlies the right hip. There is some confluent edema along the right perineum and upper medial thigh on images 94-111 series 3. Similar findings are suspected on the left, for example image 99/3. Multiple large calcified uterine fibroids are present. IMPRESSION: 1. No fracture identified. Sensitivity for subtle fractures reduced by motion artifact. 2. Paget's disease of the right hemipelvis. 3.  Mild right sacroiliitis. 4. Moderate chondral thinning in the right hip with faint chondrocalcinosis potentially reflecting CPPD arthropathy. 5. Subtle irregularity of the sacrococcygeal junction, probably incidental/chronic. 6. Incidental lipoma of the right semitendinosus muscle. 7. Considerable atherosclerosis. 8. There is subcutaneous edema laterally overlying the right hip, and more confluent subcutaneous edema along the perineum bilaterally. 9. Multiple large calcified uterine fibroids. Electronically Signed   By: Gaylyn Rong M.D.   On: 07/20/2016 20:18   Dg Chest Port 1 View  Result Date: 07/20/2016 CLINICAL DATA:  Status post fall, with concern for chest injury. Initial encounter. EXAM: PORTABLE CHEST 1 VIEW COMPARISON:  Chest radiograph performed 11/22/2015, and CT of the chest performed 11/27/2015 FINDINGS: The lungs are well-aerated. Peribronchial thickening is noted. Mild left basilar airspace opacity likely reflects atelectasis. There is no evidence of pleural effusion or pneumothorax. The cardiomediastinal silhouette is mildly enlarged. No acute osseous abnormalities are seen. IMPRESSION: 1. No displaced rib fracture seen. 2. Peribronchial thickening noted. Mild left basilar airspace opacity likely reflects atelectasis. Electronically Signed   By: Roanna Raider M.D.   On: 07/20/2016 23:32   Dg Hand Complete Right  Result  Date: 07/20/2016 CLINICAL DATA:  Fall this morning with right hand pain, initial encounter EXAM: RIGHT HAND - COMPLETE 3+ VIEW COMPARISON:  None. FINDINGS: Some lucency is noted within the triquetrum on the lateral projection. It would be difficult to exclude an undisplaced fracture. No other fracture is seen. Mild degenerative changes in the interphalangeal joints are noted. IMPRESSION: Question triquetrum fracture Electronically Signed   By: Alcide Clever M.D.   On: 07/20/2016 15:28   Dg Hip Unilat With Pelvis 2-3 Views Right  Result Date: 07/20/2016 CLINICAL DATA:  Fall with pain in the right hip EXAM: DG HIP (WITH OR WITHOUT PELVIS) 2-3V RIGHT COMPARISON:  None. FINDINGS: Status post left hip replacement with normal alignment. Pubic symphysis is intact. Mild to moderate degenerative changes of the right hip. No fracture or dislocation is seen. Large calcified masses in the pelvis, likely fibroids. Vascular calcification. IMPRESSION: 1. No definite acute osseous abnormality. Mild to moderate degenerative changes of the right hip 2. Status post left hip replacement with normal alignment 3. Large calcified pelvic masses are probably fibroids. Electronically Signed   By: Jasmine Pang M.D.   On: 07/20/2016 18:56    EKG: Independently reviewed.   Assessment/Plan Principal Problem:   Type 2 diabetes mellitus with hyperosmolar nonketotic hyperglycemia (HCC) Active Problems:   Long term current use of anticoagulant   CHF (congestive heart failure) (HCC)   Pulmonary hypertension (HCC)   Volume depletion   Myoclonic jerking   Hyperosmolarity syndrome   Hand fracture, right   PLAN:   Hyperosmolar state:  She is severely dehydrated.  Likely from glycouria and diuretics.  Will give IVF, but she will need IV insulin as well as I don't think she will be able to tolerate aggressive IVF.  Will admit to the ICU.  Myoclonic jerks:   Metabolic likely.  If seizure activities, it is not generalized, as she is  conversing normally.   Hand Fx:  In splint.  Will consult orthopedics for management recommendation.  Afib:  Continue with longterm coagulation per pharmacy dosing.   DVT prophylaxis: Coumadin.  Code Status: FULL CODE.  Family Communication: daughter and gd at bedside.  Disposition Plan: home when appropriate.  Consults called: None.  Admission status: inpatient.    Daylani Deblois MD FACP. [facility]  If 7PM-7AM, please contact night-coverage [email or web address] Password [email or web address]  07/21/2016, 12:51 AM

## 2016-07-20 NOTE — ED Triage Notes (Signed)
Pt was with daughter and caregiver this morning when pt got hung on her cathter bag, causing her to fall. Family denies patient hitting her head or any loc. Family states pt has been withdrawing right arm, they believe she fell on this arm.  Pt is hard of hearing.

## 2016-07-20 NOTE — ED Notes (Signed)
Pt pointing to pain in right hand.

## 2016-07-20 NOTE — ED Provider Notes (Signed)
[facility]-EMERGENCY DEPT Provider Note   CSN: [id number] Arrival date & time: 07/20/16  1443  By signing my name below, I, Phillips Climes, attest that this documentation has been prepared under the direction and in the presence of Raeford Razor, MD . Electronically Signed: Phillips Climes, Scribe. 07/20/2016. 6:11 PM.   History   Chief Complaint Chief Complaint  Patient presents with  . Fall    HPI Comments Melissa [name] is a 83 y.o. female with a PMHx significant for HTN, atrial fibrillation, CAD, CKD stage III, DDD and DM2, who presents to the Emergency Department s/p an unwitnessed fall PTA.  Pt's daughter and grand-daughter are at bedside, and the primary historians as pt is "hard of hearing."  They report that pt was in the bathroom when she got caught on her catheter bag and fell.  They deny any head impact or LOC.  They believe that she landed on her right hand, injuring it. They note that pt's hand is clenched in a fist and that her arm has been "jerking" upward since the incident.  They are unable to recall if pt has ambulated on her own since the incident, but state that she typically ambulates independently, rarely using her cane.  LEVEL 5 CAVEAT.  Pt nonverbal.  The history is provided by medical records and a caregiver. No language interpreter was used.    Past Medical History:  Diagnosis Date  . Atrial fibrillation (HCC)   . CAD (coronary artery disease)    Multivessel status post high risk PCI/DES to circumflex 11/2015 (poor candidate for CABG)  . CKD (chronic kidney disease) stage 3, GFR 30-59 ml/min   . DDD (degenerative disc disease), lumbar   . Essential hypertension, benign   . Mixed hyperlipidemia   . Type 2 diabetes mellitus [facility])     Patient Active Problem List   Diagnosis Date Noted  . Status post coronary artery stent placement   . Anemia of chronic disease 12/03/2015  . Bradycardia 12/01/2015  . CAD S/P high risk PCI 12/01/15 12/01/2015  . NSTEMI  (non-ST elevated myocardial infarction) (HCC)   . Pulmonary hypertension (HCC)   . Chronic kidney disease (CKD), stage IV (severe) (HCC) 11/24/2015  . Unstable angina (HCC) 11/22/2015  . Chest pain 11/10/2015  . Angina at rest [facility]) 11/10/2015  . Acute CHF (congestive heart failure) (HCC) 11/10/2015  . Chronic venous insufficiency 02/19/2015  . CHF (congestive heart failure) (HCC) 02/17/2015  . Tricuspid valve regurgitation 02/17/2015  . Encounter for therapeutic drug monitoring 03/11/2013  . Long term current use of anticoagulant 05/05/2010  . Mixed hyperlipidemia 09/30/2009  . Essential hypertension, benign 09/24/2008  . Persistent atrial fibrillation (HCC) 09/24/2008    Past Surgical History:  Procedure Laterality Date  . CARDIAC CATHETERIZATION N/A 11/26/2015   Procedure: Right/Left Heart Cath and Coronary Angiography;  Surgeon: Yvonne Kendall, MD;  Location: [facility] INVASIVE CV LAB;  Service: Cardiovascular;  Laterality: N/A;  . CARDIAC CATHETERIZATION N/A 12/01/2015   Procedure: Coronary Stent Intervention Rotoblader;  Surgeon: Tonny Bollman, MD;  Location: [facility] INVASIVE CV LAB;  Service: Cardiovascular;  Laterality: N/A;  . TOTAL HIP ARTHROPLASTY  01/14/03   Left    OB History    No data available       Home Medications    Prior to Admission medications   Medication Sig Start Date End Date Taking? Authorizing Provider  acetaminophen (TYLENOL) 325 MG tablet Take 2 tablets (650 mg total) by mouth every 4 (four) hours as  needed for mild pain, moderate pain, fever or headache. 12/05/15   Abelino DerrickKilroy, Luke K, PA-C  ALPRAZolam Prudy Feeler(XANAX) 0.25 MG tablet Take 0.25 mg by mouth 3 (three) times daily as needed. 11/19/15   [provider]  amLODipine (NORVASC) 2.5 MG tablet TAKE (1) TABLET BY MOUTH DAILY. 07/11/16   Jonelle SidleMcDowell, Samuel G, MD  atorvastatin (LIPITOR) 40 MG tablet Take 1 tablet (40 mg total) by mouth daily. 12/06/15   Abelino DerrickKilroy, Luke K, PA-C  clopidogrel (PLAVIX) 75 MG tablet  Take 1 tablet (75 mg total) by mouth daily. 12/06/15   Abelino DerrickKilroy, Luke K, PA-C  glimepiride (AMARYL) 2 MG tablet Take 2 mg by mouth daily.      [provider]  hydrALAZINE (APRESOLINE) 100 MG tablet Take 1 tablet (100 mg total) by mouth 3 (three) times daily. 12/05/15   Abelino DerrickKilroy, Luke K, PA-C  HYDROcodone-acetaminophen (NORCO/VICODIN) 5-325 MG tablet Take 1 tablet by mouth 4 (four) times daily.  09/10/14   [provider]  isosorbide mononitrate (IMDUR) 60 MG 24 hr tablet Take 1 tablet (60 mg total) by mouth daily. 12/06/15   Abelino DerrickKilroy, Luke K, PA-C  metoprolol tartrate (LOPRESSOR) 25 MG tablet Take 0.5 tablets (12.5 mg total) by mouth 2 (two) times daily. 12/05/15   Abelino DerrickKilroy, Luke K, PA-C  nitroGLYCERIN (NITROSTAT) 0.4 MG SL tablet Place 1 tablet (0.4 mg total) under the tongue every 5 (five) minutes as needed for chest pain. 12/05/15   Abelino DerrickKilroy, Luke K, PA-C  pantoprazole (PROTONIX) 40 MG tablet Take 1 tablet (40 mg total) by mouth daily at 6 (six) AM. 12/06/15   Kilroy, Eda PaschalLuke K, PA-C  solifenacin (VESICARE) 5 MG tablet Take 5 mg by mouth daily.    [provider]  torsemide (DEMADEX) 20 MG tablet Take 1 tablet (20 mg total) by mouth 2 (two) times daily. 02/25/15   Kari BaarsHawkins, Edward, MD  TRAVATAN Z 0.004 % SOLN ophthalmic solution Place 1 drop into both eyes at bedtime.  11/08/14   [provider]  warfarin (COUMADIN) 5 MG tablet TAKE 1 TABLET BY MOUTH DAILY EXCEPT [facility] AND SATURDAYS. 07/11/16   Jonelle SidleMcDowell, Samuel G, MD    Family History Family History  Problem Relation Age of Onset  . Diabetes type II Mother   . Diabetes type II Father   . Hypertension Father     Social History Social History  Substance Use Topics  . Smoking status: Never Smoker  . Smokeless tobacco: Never Used  . Alcohol use No     Allergies   Patient has no known allergies.   Review of Systems Review of Systems  Unable to perform ROS: Patient nonverbal  Musculoskeletal:  Positive for arthralgias, gait problem and myalgias. Negative for back pain.  Neurological: Negative for syncope.    Physical Exam Updated Vital Signs BP (!) 113/56 (BP Location: Left Arm)   Pulse 62   Temp 98.3 F (36.8 C) (Oral)   Resp 18   Ht 5\' 4"  (1.626 m)   Wt 166 lb (75.3 kg)   SpO2 95%   BMI 28.49 kg/m   Physical Exam  Constitutional: She appears well-developed and well-nourished. No distress.  HENT:  Head: Normocephalic and atraumatic.  Eyes: EOM are normal.  Neck: Normal range of motion.  Cardiovascular: Normal rate, regular rhythm and normal heart sounds.   Pulmonary/Chest: Effort normal and breath sounds normal.  Abdominal: Soft. She exhibits no distension. There is no tenderness.  Genitourinary:  Genitourinary Comments: Foley. Amber urine in  bag.   Musculoskeletal: Normal range of motion.  Pt making clenching movement with right hand.  Withdrawing her arm/myclonic jerking. Muscle tone normal otherwise. No inducible clonus.   Seems to be in pain but it is hard to specifically localize it.  Seems to be tender in right hand.  No obvious deformity.  Reaction is inconsistent.  Same type movements with manipulation of the right hip.  No apparent midline spinal tenderness.    Neurological: She is alert.  Skin: Skin is warm and dry.  Psychiatric: She has a normal mood and affect.  Nursing note and vitals reviewed.  ED Treatments / Results  DIAGNOSTIC STUDIES: Oxygen Saturation is 95% on room air, pt's documented baseline.   COORDINATION OF CARE: 5:59 PM Discussed treatment plan with pt, daughter and granddaughter at bedside.  They agreed to plan.  Labs (all labs ordered are listed, but only abnormal results are displayed) Labs Reviewed  CBC WITH DIFFERENTIAL/PLATELET - Abnormal; Notable for the following:       Result Value   WBC 10.9 (*)    RBC 3.80 (*)    Hemoglobin 11.9 (*)    HCT 35.9 (*)    Neutro Abs 9.6 (*)    All other components within normal limits   BASIC METABOLIC PANEL - Abnormal; Notable for the following:    Sodium 121 (*)    Potassium 5.5 (*)    Chloride 82 (*)    Glucose, Bld 1,183 (*)    BUN 65 (*)    Creatinine, Ser 2.77 (*)    Calcium 8.5 (*)    GFR calc non Af Amer 15 (*)    GFR calc Af Amer 17 (*)    Anion gap 16 (*)    All other components within normal limits  PROTIME-INR - Abnormal; Notable for the following:    Prothrombin Time 44.9 (*)    INR >4.01 (*)    All other components within normal limits  MAGNESIUM    EKG  EKG Interpretation None      Radiology Dg Hand Complete Right  Result Date: 07/20/2016 CLINICAL DATA:  Fall this morning with right hand pain, initial encounter EXAM: RIGHT HAND - COMPLETE 3+ VIEW COMPARISON:  None. FINDINGS: Some lucency is noted within the triquetrum on the lateral projection. It would be difficult to exclude an undisplaced fracture. No other fracture is seen. Mild degenerative changes in the interphalangeal joints are noted. IMPRESSION: Question triquetrum fracture Electronically Signed   By: Alcide Clever M.D.   On: 07/20/2016 15:28   Procedures Procedures (including critical care time)  CRITICAL CARE Performed by: Raeford Razor Total critical care time: 35 minutes Critical care time was exclusive of separately billable procedures and treating other patients. Critical care was necessary to treat or prevent imminent or life-threatening deterioration. Critical care was time spent personally by me on the following activities: development of treatment plan with patient and/or surrogate as well as nursing, discussions with consultants, evaluation of patient's response to treatment, examination of patient, obtaining history from patient or surrogate, ordering and performing treatments and interventions, ordering and review of laboratory studies, ordering and review of radiographic studies, pulse oximetry and re-evaluation of patient's condition.   Medications Ordered in  ED Medications  insulin regular (NOVOLIN R,HUMULIN R) 100 Units in sodium chloride 0.9 % 100 mL (1 Units/mL) infusion (not administered)  sodium chloride 0.9 % bolus 2,000 mL (not administered)  HYDROcodone-acetaminophen (NORCO/VICODIN) 5-325 MG per tablet 1 tablet (1 tablet Oral Given 07/20/16  1815)  LORazepam (ATIVAN) tablet 1 mg (1 mg Oral Given 07/20/16 2155)    Initial Impression / Assessment and Plan / ED Course  I have reviewed the triage vital signs and the nursing notes.  Pertinent labs & imaging results that were available during my care of the patient were reviewed by me and considered in my medical decision making (see chart for details).    Interesting case. Presented as fall. Seemed in usual state of health just prior. Most striking finding on exam is unilateral myoclonic jerking. Hard to get good exam. I'm not sure how much from being hard of hearing versus encephalopathy. IVF. Insulin gtt. Admit.   Final Clinical Impressions(s) / ED Diagnoses   Final diagnoses:  Fall  Myoclonic jerking  Hyperglycemia   I personally preformed the services scribed in my presence. The recorded information has been reviewed is accurate. Raeford Razor, MD.   New Prescriptions New Prescriptions   No medications on file     Raeford Razor, MD 07/20/16 2246

## 2016-07-20 NOTE — ED Notes (Signed)
Pt taken to xray. Nad.  

## 2016-07-20 NOTE — ED Notes (Signed)
CRITICAL VALUE ALERT  Critical Value: glucose 1183  Date & Time Notied:  6/13/201/ 2225  Provider Notified:dr kohut  Orders Received/Actions taken:

## 2016-07-21 ENCOUNTER — Encounter (HOSPITAL_COMMUNITY): Payer: Self-pay | Admitting: *Deleted

## 2016-07-21 DIAGNOSIS — S6291XA Unspecified fracture of right wrist and hand, initial encounter for closed fracture: Secondary | ICD-10-CM | POA: Diagnosis present

## 2016-07-21 LAB — BASIC METABOLIC PANEL
ANION GAP: 15 (ref 5–15)
Anion gap: 14 (ref 5–15)
Anion gap: 10 (ref 5–15)
Anion gap: 11 (ref 5–15)
BUN: 59 mg/dL — ABNORMAL HIGH (ref 6–20)
BUN: 59 mg/dL — AB (ref 6–20)
BUN: 57 mg/dL — AB (ref 6–20)
BUN: 57 mg/dL — AB (ref 6–20)
CHLORIDE: 100 mmol/L — AB (ref 101–111)
CHLORIDE: 99 mmol/L — AB (ref 101–111)
CHLORIDE: 101 mmol/L (ref 101–111)
CO2: 21 mmol/L — AB (ref 22–32)
CO2: 23 mmol/L (ref 22–32)
CO2: 28 mmol/L (ref 22–32)
CO2: 23 mmol/L (ref 22–32)
CREATININE: 2.24 mg/dL — AB (ref 0.44–1.00)
CREATININE: 2.27 mg/dL — AB (ref 0.44–1.00)
Calcium: 8.1 mg/dL — ABNORMAL LOW (ref 8.9–10.3)
Calcium: 9.1 mg/dL (ref 8.9–10.3)
Calcium: 9.2 mg/dL (ref 8.9–10.3)
Calcium: 9 mg/dL (ref 8.9–10.3)
Chloride: 90 mmol/L — ABNORMAL LOW (ref 101–111)
Creatinine, Ser: 2.52 mg/dL — ABNORMAL HIGH (ref 0.44–1.00)
Creatinine, Ser: 2.21 mg/dL — ABNORMAL HIGH (ref 0.44–1.00)
GFR calc Af Amer: 23 mL/min — ABNORMAL LOW (ref >60)
GFR calc Af Amer: 22 mL/min — ABNORMAL LOW (ref >60)
GFR calc Af Amer: 22 mL/min — ABNORMAL LOW (ref >60)
GFR calc non Af Amer: 17 mL/min — ABNORMAL LOW (ref >60)
GFR calc non Af Amer: 19 mL/min — ABNORMAL LOW (ref >60)
GFR calc non Af Amer: 19 mL/min — ABNORMAL LOW (ref >60)
GFR calc non Af Amer: 19 mL/min — ABNORMAL LOW (ref >60)
GFR, EST AFRICAN AMERICAN: 19 mL/min — AB (ref >60)
GLUCOSE: 172 mg/dL — AB (ref 65–99)
Glucose, Bld: 956 mg/dL (ref 65–99)
Glucose, Bld: 106 mg/dL — ABNORMAL HIGH (ref 65–99)
Glucose, Bld: 153 mg/dL — ABNORMAL HIGH (ref 65–99)
POTASSIUM: 3.8 mmol/L (ref 3.5–5.1)
POTASSIUM: 4.2 mmol/L (ref 3.5–5.1)
POTASSIUM: 4.1 mmol/L (ref 3.5–5.1)
Potassium: 4.7 mmol/L (ref 3.5–5.1)
SODIUM: 126 mmol/L — AB (ref 135–145)
SODIUM: 137 mmol/L (ref 135–145)
SODIUM: 137 mmol/L (ref 135–145)
SODIUM: 135 mmol/L (ref 135–145)

## 2016-07-21 LAB — GLUCOSE, CAPILLARY
GLUCOSE-CAPILLARY: 570 mg/dL — AB (ref 65–99)
GLUCOSE-CAPILLARY: 250 mg/dL — AB (ref 65–99)
GLUCOSE-CAPILLARY: 137 mg/dL — AB (ref 65–99)
GLUCOSE-CAPILLARY: 81 mg/dL (ref 65–99)
GLUCOSE-CAPILLARY: 107 mg/dL — AB (ref 65–99)
GLUCOSE-CAPILLARY: 213 mg/dL — AB (ref 65–99)
GLUCOSE-CAPILLARY: 233 mg/dL — AB (ref 65–99)
GLUCOSE-CAPILLARY: 189 mg/dL — AB (ref 65–99)
GLUCOSE-CAPILLARY: 152 mg/dL — AB (ref 65–99)
Glucose-Capillary: 470 mg/dL — ABNORMAL HIGH (ref 65–99)
Glucose-Capillary: 353 mg/dL — ABNORMAL HIGH (ref 65–99)
Glucose-Capillary: 63 mg/dL — ABNORMAL LOW (ref 65–99)
Glucose-Capillary: 61 mg/dL — ABNORMAL LOW (ref 65–99)
Glucose-Capillary: 96 mg/dL (ref 65–99)
Glucose-Capillary: 145 mg/dL — ABNORMAL HIGH (ref 65–99)

## 2016-07-21 LAB — CBC
HEMATOCRIT: 36.8 % (ref 36.0–46.0)
HEMOGLOBIN: 12.6 g/dL (ref 12.0–15.0)
MCH: 31.3 pg (ref 26.0–34.0)
MCHC: 34.2 g/dL (ref 30.0–36.0)
MCV: 91.3 fL (ref 78.0–100.0)
Platelets: 193 10*3/uL (ref 150–400)
RBC: 4.03 MIL/uL (ref 3.87–5.11)
RDW: 13.1 % (ref 11.5–15.5)
WBC: 7.6 10*3/uL (ref 4.0–10.5)

## 2016-07-21 LAB — COMPREHENSIVE METABOLIC PANEL
ALBUMIN: 3.5 g/dL (ref 3.5–5.0)
ALK PHOS: 232 U/L — AB (ref 38–126)
ALT: 21 U/L (ref 14–54)
AST: 42 U/L — AB (ref 15–41)
Anion gap: 16 — ABNORMAL HIGH (ref 5–15)
BILIRUBIN TOTAL: 1.3 mg/dL — AB (ref 0.3–1.2)
BUN: 58 mg/dL — AB (ref 6–20)
CALCIUM: 8.9 mg/dL (ref 8.9–10.3)
CO2: 23 mmol/L (ref 22–32)
Chloride: 96 mmol/L — ABNORMAL LOW (ref 101–111)
Creatinine, Ser: 2.42 mg/dL — ABNORMAL HIGH (ref 0.44–1.00)
GFR calc Af Amer: 20 mL/min — ABNORMAL LOW (ref >60)
GFR calc non Af Amer: 17 mL/min — ABNORMAL LOW (ref >60)
GLUCOSE: 489 mg/dL — AB (ref 65–99)
POTASSIUM: 3.7 mmol/L (ref 3.5–5.1)
SODIUM: 135 mmol/L (ref 135–145)
TOTAL PROTEIN: 6.8 g/dL (ref 6.5–8.1)

## 2016-07-21 LAB — TSH: TSH: 1.17 u[IU]/mL (ref 0.350–4.500)

## 2016-07-21 LAB — PROTIME-INR
INR: 5.81 — AB
Prothrombin Time: 54 seconds — ABNORMAL HIGH (ref 11.4–15.2)

## 2016-07-21 LAB — MRSA PCR SCREENING: MRSA BY PCR: NEGATIVE

## 2016-07-21 LAB — CBG MONITORING, ED: Glucose-Capillary: >600 mg/dL (ref 65–99)

## 2016-07-21 MED ORDER — SODIUM CHLORIDE 0.9 % IV SOLN
INTRAVENOUS | Status: AC
  Filled 2016-07-21: qty 1

## 2016-07-21 MED ORDER — METOPROLOL TARTRATE 25 MG PO TABS
12.5000 mg | ORAL_TABLET | Freq: Two times a day (BID) | ORAL | Status: DC
  Administered 2016-07-22 – 2016-07-23 (×2): 12.5 mg via ORAL
  Filled 2016-07-21 (×4): qty 1

## 2016-07-21 MED ORDER — PANTOPRAZOLE SODIUM 40 MG PO TBEC
40.0000 mg | DELAYED_RELEASE_TABLET | Freq: Every day | ORAL | Status: DC
  Administered 2016-07-22 – 2016-07-26 (×5): 40 mg via ORAL
  Filled 2016-07-21 (×5): qty 1

## 2016-07-21 MED ORDER — LATANOPROST 0.005 % OP SOLN
1.0000 [drp] | Freq: Every day | OPHTHALMIC | Status: DC
  Administered 2016-07-21 – 2016-07-25 (×5): 1 [drp] via OPHTHALMIC
  Filled 2016-07-21: qty 2.5

## 2016-07-21 MED ORDER — WARFARIN - PHARMACIST DOSING INPATIENT
Freq: Every day | Status: DC
  Administered 2016-07-21 – 2016-07-24 (×2)

## 2016-07-21 MED ORDER — CHLORHEXIDINE GLUCONATE 0.12 % MT SOLN
15.0000 mL | Freq: Two times a day (BID) | OROMUCOSAL | Status: DC
  Administered 2016-07-21 – 2016-07-23 (×7): 15 mL via OROMUCOSAL
  Filled 2016-07-21 (×6): qty 15

## 2016-07-21 MED ORDER — INSULIN ASPART 100 UNIT/ML ~~LOC~~ SOLN
0.0000 [IU] | Freq: Every day | SUBCUTANEOUS | Status: DC
  Administered 2016-07-22: 3 [IU] via SUBCUTANEOUS
  Administered 2016-07-23: 4 [IU] via SUBCUTANEOUS
  Administered 2016-07-24: 2 [IU] via SUBCUTANEOUS
  Administered 2016-07-25: 3 [IU] via SUBCUTANEOUS

## 2016-07-21 MED ORDER — AMLODIPINE BESYLATE 5 MG PO TABS
2.5000 mg | ORAL_TABLET | Freq: Every day | ORAL | Status: DC
  Administered 2016-07-21 – 2016-07-25 (×5): 2.5 mg via ORAL
  Filled 2016-07-21 (×6): qty 1

## 2016-07-21 MED ORDER — INSULIN DETEMIR 100 UNIT/ML ~~LOC~~ SOLN
10.0000 [IU] | Freq: Once | SUBCUTANEOUS | Status: AC
  Administered 2016-07-21: 10 [IU] via SUBCUTANEOUS
  Filled 2016-07-21: qty 0.1

## 2016-07-21 MED ORDER — SODIUM CHLORIDE 0.9 % IV SOLN: INTRAVENOUS | Status: DC

## 2016-07-21 MED ORDER — SODIUM CHLORIDE 0.9 % IV SOLN
INTRAVENOUS | Status: DC
  Administered 2016-07-21 – 2016-07-22 (×2): via INTRAVENOUS
  Administered 2016-07-22: 1000 mL via INTRAVENOUS

## 2016-07-21 MED ORDER — INSULIN DETEMIR 100 UNIT/ML ~~LOC~~ SOLN
20.0000 [IU] | Freq: Every day | SUBCUTANEOUS | Status: DC
  Administered 2016-07-21 – 2016-07-25 (×5): 20 [IU] via SUBCUTANEOUS
  Filled 2016-07-21 (×6): qty 0.2

## 2016-07-21 MED ORDER — LATANOPROST 0.005 % OP SOLN
OPHTHALMIC | Status: AC
  Filled 2016-07-21: qty 2.5

## 2016-07-21 MED ORDER — ATORVASTATIN CALCIUM 40 MG PO TABS
40.0000 mg | ORAL_TABLET | Freq: Every day | ORAL | Status: DC
  Administered 2016-07-21 – 2016-07-25 (×5): 40 mg via ORAL
  Filled 2016-07-21 (×6): qty 1

## 2016-07-21 MED ORDER — ACETAMINOPHEN 325 MG PO TABS
650.0000 mg | ORAL_TABLET | ORAL | Status: DC | PRN
  Administered 2016-07-22 – 2016-07-26 (×3): 650 mg via ORAL
  Filled 2016-07-21 (×3): qty 2

## 2016-07-21 MED ORDER — ISOSORBIDE MONONITRATE ER 60 MG PO TB24
60.0000 mg | ORAL_TABLET | Freq: Every day | ORAL | Status: DC
  Administered 2016-07-21 – 2016-07-25 (×5): 60 mg via ORAL
  Filled 2016-07-21 (×5): qty 1

## 2016-07-21 MED ORDER — HYDRALAZINE HCL 25 MG PO TABS
100.0000 mg | ORAL_TABLET | Freq: Three times a day (TID) | ORAL | Status: DC
  Administered 2016-07-22 – 2016-07-25 (×8): 100 mg via ORAL
  Filled 2016-07-21: qty 2
  Filled 2016-07-21 (×4): qty 4
  Filled 2016-07-21: qty 2
  Filled 2016-07-21: qty 4
  Filled 2016-07-21: qty 2
  Filled 2016-07-21 (×6): qty 4
  Filled 2016-07-21: qty 2
  Filled 2016-07-21 (×2): qty 4

## 2016-07-21 MED ORDER — INSULIN ASPART 100 UNIT/ML ~~LOC~~ SOLN
0.0000 [IU] | Freq: Three times a day (TID) | SUBCUTANEOUS | Status: DC
  Administered 2016-07-21: 2 [IU] via SUBCUTANEOUS
  Administered 2016-07-22: 5 [IU] via SUBCUTANEOUS
  Administered 2016-07-22: 3 [IU] via SUBCUTANEOUS
  Administered 2016-07-22: 2 [IU] via SUBCUTANEOUS
  Administered 2016-07-23 (×3): 5 [IU] via SUBCUTANEOUS
  Administered 2016-07-24: 3 [IU] via SUBCUTANEOUS
  Administered 2016-07-24 (×2): 2 [IU] via SUBCUTANEOUS
  Administered 2016-07-25: 3 [IU] via SUBCUTANEOUS
  Administered 2016-07-25: 2 [IU] via SUBCUTANEOUS
  Administered 2016-07-25: 8 [IU] via SUBCUTANEOUS

## 2016-07-21 MED ORDER — CLOPIDOGREL BISULFATE 75 MG PO TABS
75.0000 mg | ORAL_TABLET | Freq: Every day | ORAL | Status: DC
  Administered 2016-07-23 – 2016-07-25 (×3): 75 mg via ORAL
  Filled 2016-07-21 (×4): qty 1

## 2016-07-21 MED ORDER — DEXTROSE 50 % IV SOLN
INTRAVENOUS | Status: AC
Start: 2016-07-21 — End: 2016-07-21
  Administered 2016-07-21: 50 mL
  Filled 2016-07-21: qty 50

## 2016-07-21 MED ORDER — SODIUM CHLORIDE 0.9% FLUSH
3.0000 mL | Freq: Two times a day (BID) | INTRAVENOUS | Status: DC
  Administered 2016-07-21 (×3): 3 mL via INTRAVENOUS
  Administered 2016-07-22: 10 mL via INTRAVENOUS
  Administered 2016-07-22 – 2016-07-25 (×6): 3 mL via INTRAVENOUS

## 2016-07-21 MED ORDER — ORAL CARE MOUTH RINSE
15.0000 mL | Freq: Two times a day (BID) | OROMUCOSAL | Status: DC
  Administered 2016-07-21 – 2016-07-23 (×5): 15 mL via OROMUCOSAL

## 2016-07-21 NOTE — Care Management Important Message (Signed)
Important Message  Patient Details  Name: Carley HammedBettie L Brickner MRN: [id number] Date of Birth: 1930-12-04   Medicare Important Message Given:  Yes    Lakyla Biswas, Chrystine OilerSharley Diane, RN 07/21/2016, 2:39 PM

## 2016-07-21 NOTE — Progress Notes (Signed)
ANTICOAGULATION CONSULT NOTE - Initial Consult  Pharmacy Consult for Coumadin Indication: atrial fibrillation  No Known Allergies  Patient Measurements: Height: 5\' 6"  (167.6 cm) Weight: 137 lb 5.6 oz (62.3 kg) IBW/kg (Calculated) : 59.3  Vital Signs: Temp: 97.2 F (36.2 C) (06/14 0740) Temp Source: Oral (06/14 0740) BP: 100/50 (06/14 0800) Pulse Rate: 66 (06/14 0800)  Labs:  Recent Labs  07/20/16 2142 07/21/16 0127 07/21/16 0551  HGB 11.9*  --  12.6  HCT 35.9*  --  36.8  PLT 172  --  193  LABPROT 44.9*  --  54.0*  INR >4.01*  --  5.81*  CREATININE 2.77* 2.52* 2.42*    Estimated Creatinine Clearance: 16.5 mL/min (A) (by C-G formula based on SCr of 2.42 mg/dL (H)).   Medical History: Past Medical History:  Diagnosis Date  . Atrial fibrillation (HCC)   . CAD (coronary artery disease)    Multivessel status post high risk PCI/DES to circumflex 11/2015 (poor candidate for CABG)  . CKD (chronic kidney disease) stage 3, GFR 30-59 ml/min   . DDD (degenerative disc disease), lumbar   . Essential hypertension, benign   . Mixed hyperlipidemia   . Type 2 diabetes mellitus (HCC)     Medications:  Prescriptions Prior to Admission  Medication Sig Dispense Refill Last Dose  . acetaminophen (TYLENOL) 325 MG tablet Take 2 tablets (650 mg total) by mouth every 4 (four) hours as needed for mild pain, moderate pain, fever or headache.   unknown  . ALPRAZolam (XANAX) 0.25 MG tablet Take 0.25 mg by mouth 3 (three) times daily as needed.   07/20/2016 at Unknown time  . amLODipine (NORVASC) 2.5 MG tablet TAKE (1) TABLET BY MOUTH DAILY. 30 tablet 6 07/20/2016 at Unknown time  . atorvastatin (LIPITOR) 40 MG tablet Take 1 tablet (40 mg total) by mouth daily. 90 tablet 3 07/20/2016 at Unknown time  . clopidogrel (PLAVIX) 75 MG tablet Take 1 tablet (75 mg total) by mouth daily. 90 tablet 3 07/20/2016 at 0800  . glimepiride (AMARYL) 2 MG tablet Take 2 mg by mouth daily.     07/20/2016 at Unknown  time  . hydrALAZINE (APRESOLINE) 100 MG tablet Take 1 tablet (100 mg total) by mouth 3 (three) times daily. 270 tablet 3 07/20/2016 at Unknown time  . HYDROcodone-acetaminophen (NORCO/VICODIN) 5-325 MG tablet Take 1 tablet by mouth 4 (four) times daily.    07/20/2016 at Unknown time  . isosorbide mononitrate (IMDUR) 60 MG 24 hr tablet Take 1 tablet (60 mg total) by mouth daily. 90 tablet 3 07/20/2016 at Unknown time  . metoprolol tartrate (LOPRESSOR) 25 MG tablet Take 0.5 tablets (12.5 mg total) by mouth 2 (two) times daily. 90 tablet 3 07/20/2016 at 0800  . nitroGLYCERIN (NITROSTAT) 0.4 MG SL tablet Place 1 tablet (0.4 mg total) under the tongue every 5 (five) minutes as needed for chest pain. 25 tablet 2 unknown  . pantoprazole (PROTONIX) 40 MG tablet Take 1 tablet (40 mg total) by mouth daily at 6 (six) AM. 90 tablet 3 07/20/2016 at Unknown time  . torsemide (DEMADEX) 20 MG tablet Take 1 tablet (20 mg total) by mouth 2 (two) times daily. 60 tablet 12 07/20/2016 at Unknown time  . TRAVATAN Z 0.004 % SOLN ophthalmic solution Place 1 drop into both eyes at bedtime.    07/20/2016 at Unknown time  . warfarin (COUMADIN) 5 MG tablet TAKE 1 TABLET BY MOUTH DAILY EXCEPT 1/2 TABLET ON WEDNESDAYS AND SATURDAYS. 30 tablet 6 07/19/2016  at 2030    Assessment: 83 yo female with hx of afib on anticoagulation with Coumadin. Known CAD, DM, HTN, HLD, presented to the ER after feeling weak and fell. Her blood sugars were extremely elevated and she is severely dehydrated. Will continue with coumadin while managing her hyperosmolar nonketotic hyperglycemia. Pharmacy to dose. INR is 5.81 this AM.  Goal of Therapy:  INR 2-3 Monitor platelets by anticoagulation protocol: Yes   Plan:  No coumadin today Daily PT-INR Monitor for S/S of bleeding  Elder Cyphers, BS Loura Back, BCPS Clinical Pharmacist Pager [phone] 07/21/2016,8:36 AM

## 2016-07-21 NOTE — Progress Notes (Signed)
CRITICAL VALUE ALERT  Critical Value:  CBG 1045 63 post 15 min  (107), CBG 1210 61 post 15 min (96)  Date & Time Notied:  07/21/16   Provider Notified: Glucostabilizer followed per protocol.   Orders Received/Actions taken:  D50 15ml IVP, recheck CBG in 15 min per protocol

## 2016-07-21 NOTE — Progress Notes (Signed)
CRITICAL VALUE ALERT  Critical Value:  INR 5.87  Date & Time Notied:  6/47/2018 0645  Provider Notified: P. LE  Orders Received/Actions taken: No orders given

## 2016-07-21 NOTE — Progress Notes (Signed)
ANTICOAGULATION CONSULT NOTE - Initial Consult  Pharmacy Consult for warfarin Indication: atrial fibrillation  No Known Allergies  Patient Measurements: Height: 5\' 6"  (167.6 cm) Weight: 136 lb 14.5 oz (62.1 kg) IBW/kg (Calculated) : 59.3  Vital Signs: Temp: 97.7 F (36.5 C) (06/14 0058) Temp Source: Axillary (06/14 0058) BP: 131/56 (06/14 0104) Pulse Rate: 65 (06/14 0104)  Labs:  Recent Labs  07/20/16 2142  HGB 11.9*  HCT 35.9*  PLT 172  LABPROT 44.9*  INR >4.01*  CREATININE 2.77*    Estimated Creatinine Clearance: 14.4 mL/min (A) (by C-G formula based on SCr of 2.77 mg/dL (H)).   Medical History: Past Medical History:  Diagnosis Date  . Atrial fibrillation (HCC)   . CAD (coronary artery disease)    Multivessel status post high risk PCI/DES to circumflex 11/2015 (poor candidate for CABG)  . CKD (chronic kidney disease) stage 3, GFR 30-59 ml/min   . DDD (degenerative disc disease), lumbar   . Essential hypertension, benign   . Mixed hyperlipidemia   . Type 2 diabetes mellitus (HCC)     Medications:  PTA warfarin 2.5 mg on Wed and Sat, 5 mg all other days - last dose taken 6/12 at 2030   Assessment: Pt being admitted with hyperglycemic hyperosmolar nonketotic state with severe dehydration. Despite not taking her warfarin last night her INR is supratherapeutic at  > 4 (goal 2-3). Severe dehydration is likely contributing to the elevation in her INR. Will draw another INR in the morning after hydration and reassess at that point. Will hold on ordering her home regimen at this time.   Goal of Therapy:  INR 2-3 Monitor platelets by anticoagulation protocol: Yes   Plan:  1. Recheck INR in the morning after hydration to reassess  2. Restart on home regimen when INR within therapeutic range  Vashawn Ekstein [place] 07/21/2016,1:38 AM

## 2016-07-21 NOTE — Progress Notes (Signed)
Subjective:  Patient was admitted yesterday due to diabetes with hyperosmolar nonketotic hyperglycemia. Patient received IV fluid and insulin therapy according top protocol. Her blood sugar has improved and down to 250 mg/dl. Patient remained weak and sick looking. No fever or chills.  Objective: Vital signs in last 24 hours: Temp:  [97.2 F (36.2 C)-98.3 F (36.8 C)] 97.2 F (36.2 C) (06/14 0740) Pulse Rate:  [45-71] 71 (06/14 0740) Resp:  [9-21] 15 (06/14 0740) BP: (87-161)/(43-102) 89/49 (06/14 0700) SpO2:  [88 %-100 %] 96 % (06/14 0740) Weight:  [62.1 kg (136 lb 14.5 oz)-75.3 kg (166 lb)] 62.3 kg (137 lb 5.6 oz) (06/14 0500) Weight change:  Last BM Date: 07/21/16  Intake/Output from previous day: 06/13 0701 - 06/14 0700 In: 2648 [I.V.:648; IV Piggyback:2000] Out: 100 [Urine:100]  PHYSICAL EXAM General appearance: fatigued and slowed mentation Resp: clear to auscultation bilaterally Cardio: S1, S2 normal GI: soft, non-tender; bowel sounds normal; no masses,  no organomegaly Extremities: extremities normal, atraumatic, no cyanosis or edema  Lab Results:  Results for orders placed or performed during the hospital encounter of 07/20/16 (from the past 48 hour(s))  CBC with Differential     Status: Abnormal   Collection Time: 07/20/16  9:42 PM  Result Value Ref Range   WBC 10.9 (H) 4.0 - 10.5 K/uL   RBC 3.80 (L) 3.87 - 5.11 MIL/uL   Hemoglobin 11.9 (L) 12.0 - 15.0 g/dL   HCT 35.9 (L) 36.0 - 46.0 %   MCV 94.5 78.0 - 100.0 fL   MCH 31.3 26.0 - 34.0 pg   MCHC 33.1 30.0 - 36.0 g/dL   RDW 13.6 11.5 - 15.5 %   Platelets 172 150 - 400 K/uL   Neutrophils Relative % 87 %   Neutro Abs 9.6 (H) 1.7 - 7.7 K/uL   Lymphocytes Relative 7 %   Lymphs Abs 0.7 0.7 - 4.0 K/uL   Monocytes Relative 6 %   Monocytes Absolute 0.7 0.1 - 1.0 K/uL   Eosinophils Relative 0 %   Eosinophils Absolute 0.0 0.0 - 0.7 K/uL   Basophils Relative 0 %   Basophils Absolute 0.0 0.0 - 0.1 K/uL  Basic  metabolic panel     Status: Abnormal   Collection Time: 07/20/16  9:42 PM  Result Value Ref Range   Sodium 121 (L) 135 - 145 mmol/L   Potassium 5.5 (H) 3.5 - 5.1 mmol/L   Chloride 82 (L) 101 - 111 mmol/L   CO2 23 22 - 32 mmol/L   Glucose, Bld 1,183 (HH) 65 - 99 mg/dL    Comment: CRITICAL RESULT CALLED TO, READ BACK BY AND VERIFIED WITH: WINGHAM,C AT 2220 ON 6.13.2018 BY ISLEY,B    BUN 65 (H) 6 - 20 mg/dL   Creatinine, Ser 2.77 (H) 0.44 - 1.00 mg/dL   Calcium 8.5 (L) 8.9 - 10.3 mg/dL   GFR calc non Af Amer 15 (L) >60 mL/min   GFR calc Af Amer 17 (L) >60 mL/min    Comment: (NOTE) The eGFR has been calculated using the CKD EPI equation. This calculation has not been validated in all clinical situations. eGFR's persistently <60 mL/min signify possible Chronic Kidney Disease.    Anion gap 16 (H) 5 - 15  Magnesium     Status: None   Collection Time: 07/20/16  9:42 PM  Result Value Ref Range   Magnesium 2.3 1.7 - 2.4 mg/dL  Protime-INR     Status: Abnormal   Collection Time: 07/20/16  9:42 PM  Result Value Ref Range   Prothrombin Time 44.9 (H) 11.4 - 15.2 seconds   INR >4.01 (HH)     Comment: RESULT REPEATED AND VERIFIED CRITICAL RESULT CALLED TO, READ BACK BY AND VERIFIED WITH:  BELTON,K @ 2232 ON 07/20/16 BY JUW   TSH     Status: None   Collection Time: 07/20/16  9:42 PM  Result Value Ref Range   TSH 1.170 0.350 - 4.500 uIU/mL    Comment: Performed by a 3rd Generation assay with a functional sensitivity of <=0.01 uIU/mL.  CBG monitoring, ED     Status: Abnormal   Collection Time: 07/21/16 12:21 AM  Result Value Ref Range   Glucose-Capillary >600 (HH) 65 - 99 mg/dL  Basic metabolic panel     Status: Abnormal   Collection Time: 07/21/16  1:27 AM  Result Value Ref Range   Sodium 126 (L) 135 - 145 mmol/L   Potassium 4.7 3.5 - 5.1 mmol/L   Chloride 90 (L) 101 - 111 mmol/L   CO2 21 (L) 22 - 32 mmol/L   Glucose, Bld 956 (HH) 65 - 99 mg/dL    Comment: CRITICAL RESULT CALLED TO,  READ BACK BY AND VERIFIED WITH:  SMITH,J @ 0210 ON 07/21/16 BY JUW    BUN 59 (H) 6 - 20 mg/dL   Creatinine, Ser 2.52 (H) 0.44 - 1.00 mg/dL   Calcium 8.1 (L) 8.9 - 10.3 mg/dL   GFR calc non Af Amer 17 (L) >60 mL/min   GFR calc Af Amer 19 (L) >60 mL/min    Comment: (NOTE) The eGFR has been calculated using the CKD EPI equation. This calculation has not been validated in all clinical situations. eGFR's persistently <60 mL/min signify possible Chronic Kidney Disease.    Anion gap 15 5 - 15  Glucose, capillary     Status: Abnormal   Collection Time: 07/21/16  1:33 AM  Result Value Ref Range   Glucose-Capillary >600 (HH) 65 - 99 mg/dL   Comment 1 Notify RN   Glucose, capillary     Status: Abnormal   Collection Time: 07/21/16  2:28 AM  Result Value Ref Range   Glucose-Capillary >600 (HH) 65 - 99 mg/dL  Glucose, capillary     Status: Abnormal   Collection Time: 07/21/16  3:26 AM  Result Value Ref Range   Glucose-Capillary >600 (HH) 65 - 99 mg/dL   Comment 1 Notify RN   Glucose, capillary     Status: Abnormal   Collection Time: 07/21/16  4:28 AM  Result Value Ref Range   Glucose-Capillary 570 (HH) 65 - 99 mg/dL   Comment 1 Notify RN    Comment 2 Document in Chart   Glucose, capillary     Status: Abnormal   Collection Time: 07/21/16  5:31 AM  Result Value Ref Range   Glucose-Capillary 470 (H) 65 - 99 mg/dL   Comment 1 Notify RN    Comment 2 Document in Chart   Comprehensive metabolic panel     Status: Abnormal   Collection Time: 07/21/16  5:51 AM  Result Value Ref Range   Sodium 135 135 - 145 mmol/L    Comment: DELTA CHECK NOTED   Potassium 3.7 3.5 - 5.1 mmol/L   Chloride 96 (L) 101 - 111 mmol/L   CO2 23 22 - 32 mmol/L   Glucose, Bld 489 (H) 65 - 99 mg/dL   BUN 58 (H) 6 - 20 mg/dL   Creatinine, Ser 2.42 (H) 0.44 - 1.00 mg/dL  Calcium 8.9 8.9 - 10.3 mg/dL   Total Protein 6.8 6.5 - 8.1 g/dL   Albumin 3.5 3.5 - 5.0 g/dL   AST 42 (H) 15 - 41 U/L   ALT 21 14 - 54 U/L    Alkaline Phosphatase 232 (H) 38 - 126 U/L   Total Bilirubin 1.3 (H) 0.3 - 1.2 mg/dL   GFR calc non Af Amer 17 (L) >60 mL/min   GFR calc Af Amer 20 (L) >60 mL/min    Comment: (NOTE) The eGFR has been calculated using the CKD EPI equation. This calculation has not been validated in all clinical situations. eGFR's persistently <60 mL/min signify possible Chronic Kidney Disease.    Anion gap 16 (H) 5 - 15  CBC     Status: None   Collection Time: 07/21/16  5:51 AM  Result Value Ref Range   WBC 7.6 4.0 - 10.5 K/uL   RBC 4.03 3.87 - 5.11 MIL/uL   Hemoglobin 12.6 12.0 - 15.0 g/dL   HCT 36.8 36.0 - 46.0 %   MCV 91.3 78.0 - 100.0 fL   MCH 31.3 26.0 - 34.0 pg   MCHC 34.2 30.0 - 36.0 g/dL   RDW 13.1 11.5 - 15.5 %   Platelets 193 150 - 400 K/uL  Protime-INR     Status: Abnormal   Collection Time: 07/21/16  5:51 AM  Result Value Ref Range   Prothrombin Time 54.0 (H) 11.4 - 15.2 seconds   INR 5.81 (HH)     Comment: RESULT REPEATED AND VERIFIED CRITICAL RESULT CALLED TO, READ BACK BY AND VERIFIED WITH: HEARN,J AT 2671 ON 07/21/16 BY JTORTORICI   Glucose, capillary     Status: Abnormal   Collection Time: 07/21/16  6:33 AM  Result Value Ref Range   Glucose-Capillary 353 (H) 65 - 99 mg/dL  Glucose, capillary     Status: Abnormal   Collection Time: 07/21/16  7:32 AM  Result Value Ref Range   Glucose-Capillary 250 (H) 65 - 99 mg/dL    ABGS No results for input(s): PHART, PO2ART, TCO2, HCO3 in the last 72 hours.  Invalid input(s): PCO2 CULTURES No results found for this or any previous visit (from the past 240 hour(s)). Studies/Results: Dg Lumbar Spine Complete  Result Date: 07/20/2016 CLINICAL DATA:  Fall with pain EXAM: LUMBAR SPINE - COMPLETE 4+ VIEW COMPARISON:  None. FINDINGS: Large calcified pelvic masses. Aortic atherosclerosis. Limited details on the lateral views. Suspect that there may be grade 1 anterolisthesis of L4 on L5. Vertebral body heights are grossly maintained.  Moderate-to-marked degenerative disc changes at L2-L3 and L3-L4. Moderate changes at L5-S1 and L1-L2. IMPRESSION: Degenerative changes.  No definite acute osseous abnormality. Electronically Signed   By: Donavan Foil M.D.   On: 07/20/2016 18:59   Ct Head Wo Contrast  Result Date: 07/20/2016 CLINICAL DATA:  Spastic movement of right arm, history of a fall EXAM: CT HEAD WITHOUT CONTRAST TECHNIQUE: Contiguous axial images were obtained from the base of the skull through the vertex without intravenous contrast. COMPARISON:  None. FINDINGS: Brain: Motion artifact limits the exam. No acute territorial infarction, hemorrhage or intracranial mass is seen. Mild to moderate periventricular and deep white matter small vessel ischemic changes. Mild atrophy. Ventricles within normal limits. Vascular: No hyperdense vessels.  Carotid artery calcifications. Skull: No fracture or suspicious bone lesion Sinuses/Orbits: No acute finding. Other: None IMPRESSION: The examination is slightly degraded by patient motion. No acute intracranial abnormality. Atrophy and small vessel ischemic changes of the white  matter. Electronically Signed   By: Donavan Foil M.D.   On: 07/20/2016 22:35   Ct Hip Right Wo Contrast  Result Date: 07/20/2016 CLINICAL DATA:  Right hip pain.  Fall today. EXAM: CT OF THE RIGHT HIP WITHOUT CONTRAST TECHNIQUE: Multidetector CT imaging of the right hip was performed according to the standard protocol. Multiplanar CT image reconstructions were also generated. COMPARISON:  12/19/2002 and 07/20/2016 radiographs FINDINGS: Despite efforts by the technologist and patient, motion artifact is present on today's exam and could not be eliminated. This reduces exam sensitivity and specificity. Bones/Joint/Cartilage Cortical thickening and trabecular thickening in the right iliac bone, acetabulum, and ischium characteristic of Paget' s disease of bone. Mild sclerosis along the right sacral ala adjacent to the sacroiliac  joint suggesting mild sacroiliitis. Spurring of the right femoral head and acetabulum. Accounting for the motion artifact as best I can, I do not observe a definite fracture involving the right proximal femur or regional right hemipelvis. Moderate loss of chondral thickness in the right hip. Faint chondrocalcinosis. Subtle irregularity of the sacrococcygeal junction anteriorly for example on image 14/8, this could be chronic. Ligaments Suboptimally assessed by CT. Muscles and Tendons We partially include a lipoma of the right semi tendinosis muscle. Soft tissues Iliac and femoral artery atherosclerotic calcification. Subcutaneous edema overlies the right hip. There is some confluent edema along the right perineum and upper medial thigh on images 94-111 series 3. Similar findings are suspected on the left, for example image 99/3. Multiple large calcified uterine fibroids are present. IMPRESSION: 1. No fracture identified. Sensitivity for subtle fractures reduced by motion artifact. 2. Paget's disease of the right hemipelvis. 3. Mild right sacroiliitis. 4. Moderate chondral thinning in the right hip with faint chondrocalcinosis potentially reflecting CPPD arthropathy. 5. Subtle irregularity of the sacrococcygeal junction, probably incidental/chronic. 6. Incidental lipoma of the right semitendinosus muscle. 7. Considerable atherosclerosis. 8. There is subcutaneous edema laterally overlying the right hip, and more confluent subcutaneous edema along the perineum bilaterally. 9. Multiple large calcified uterine fibroids. Electronically Signed   By: Van Clines M.D.   On: 07/20/2016 20:18   Dg Chest Port 1 View  Result Date: 07/20/2016 CLINICAL DATA:  Status post fall, with concern for chest injury. Initial encounter. EXAM: PORTABLE CHEST 1 VIEW COMPARISON:  Chest radiograph performed 11/22/2015, and CT of the chest performed 11/27/2015 FINDINGS: The lungs are well-aerated. Peribronchial thickening is noted.  Mild left basilar airspace opacity likely reflects atelectasis. There is no evidence of pleural effusion or pneumothorax. The cardiomediastinal silhouette is mildly enlarged. No acute osseous abnormalities are seen. IMPRESSION: 1. No displaced rib fracture seen. 2. Peribronchial thickening noted. Mild left basilar airspace opacity likely reflects atelectasis. Electronically Signed   By: Garald Balding M.D.   On: 07/20/2016 23:32   Dg Hand Complete Right  Result Date: 07/20/2016 CLINICAL DATA:  Fall this morning with right hand pain, initial encounter EXAM: RIGHT HAND - COMPLETE 3+ VIEW COMPARISON:  None. FINDINGS: Some lucency is noted within the triquetrum on the lateral projection. It would be difficult to exclude an undisplaced fracture. No other fracture is seen. Mild degenerative changes in the interphalangeal joints are noted. IMPRESSION: Question triquetrum fracture Electronically Signed   By: Inez Catalina M.D.   On: 07/20/2016 15:28   Dg Hip Unilat With Pelvis 2-3 Views Right  Result Date: 07/20/2016 CLINICAL DATA:  Fall with pain in the right hip EXAM: DG HIP (WITH OR WITHOUT PELVIS) 2-3V RIGHT COMPARISON:  None. FINDINGS: Status post left  hip replacement with normal alignment. Pubic symphysis is intact. Mild to moderate degenerative changes of the right hip. No fracture or dislocation is seen. Large calcified masses in the pelvis, likely fibroids. Vascular calcification. IMPRESSION: 1. No definite acute osseous abnormality. Mild to moderate degenerative changes of the right hip 2. Status post left hip replacement with normal alignment 3. Large calcified pelvic masses are probably fibroids. Electronically Signed   By: Donavan Foil M.D.   On: 07/20/2016 18:56    Medications: I have reviewed the patient's current medications.  Assesment:  Principal Problem:   Type 2 diabetes mellitus with hyperosmolar nonketotic hyperglycemia (HCC) Active Problems:   Long term current use of anticoagulant    CHF (congestive heart failure) (HCC)   Pulmonary hypertension (HCC)   Volume depletion   Myoclonic jerking   Hyperosmolarity syndrome   Hand fracture, right    Plan:  Medications reviewed Continue insulin therpay and IV hydration Will advance diet as tolerated Coumadin according to pharmacy    LOS: 1 day   Manraj Yeo 07/21/2016, 8:06 AM

## 2016-07-21 NOTE — Care Management Note (Addendum)
Case Management Note  Patient Details  Name: Carley HammedBettie L Eckmann MRN: [id number] Date of Birth: [birth year]  Subjective/Objective:  Adm from  Home with wrist fracture and hyperosmoler nonketotic hypergylcemia. Daughter lives with her and assists with ADL's. Patient has an aide for 2-3 hours/day. Patient has cane and rollator PTA.                   Action/Plan: Daughter would like for patient to have a front wheeled walker as patient does not do well with Rollator walker. CM [facility] and [facility].   ADDENDUM: Patient received rollator in January of this year. Not eligible for Insurance to pay for another walker. Family updated on options of where to buy walker by [facility] rep. Alroy BailiffLinda Lothian.   Expected Discharge Date:     07/22/2016             Expected Discharge Plan:  Home/Self Care  In-House Referral:     Discharge planning Services  CM Consult  Post Acute Care Choice:  Durable Medical Equipment Choice offered to:     DME Arranged:    DME Agency:     HH Arranged:    HH Agency:     Status of Service:  In process, will continue to follow  If discussed at Long Length of Stay Meetings, dates discussed:    Additional Comments:  Melanie Pellot, Chrystine OilerSharley Diane, RN 07/21/2016, 2:33 PM

## 2016-07-22 LAB — GLUCOSE, CAPILLARY
GLUCOSE-CAPILLARY: 177 mg/dL — AB (ref 65–99)
GLUCOSE-CAPILLARY: 223 mg/dL — AB (ref 65–99)
Glucose-Capillary: 54 mg/dL — ABNORMAL LOW (ref 65–99)
Glucose-Capillary: 175 mg/dL — ABNORMAL HIGH (ref 65–99)
Glucose-Capillary: 154 mg/dL — ABNORMAL HIGH (ref 65–99)
Glucose-Capillary: 284 mg/dL — ABNORMAL HIGH (ref 65–99)

## 2016-07-22 LAB — PROTIME-INR
INR: 6.11
Prothrombin Time: 56.2 seconds — ABNORMAL HIGH (ref 11.4–15.2)

## 2016-07-22 MED ORDER — LIVING WELL WITH DIABETES BOOK
Freq: Once | Status: AC
  Administered 2016-07-22: 15:00:00
  Filled 2016-07-22: qty 1

## 2016-07-22 MED ORDER — INSULIN STARTER KIT- PEN NEEDLES (ENGLISH)
1.0000 | Freq: Once | Status: AC
Start: 2016-07-22 — End: 2016-07-22
  Administered 2016-07-22: 1
  Filled 2016-07-22: qty 1

## 2016-07-22 NOTE — Progress Notes (Signed)
ANTICOAGULATION CONSULT NOTE   Pharmacy Consult for Coumadin Indication: atrial fibrillation  No Known Allergies  Patient Measurements: Height: 5\' 6"  (167.6 cm) Weight: 137 lb 5.6 oz (62.3 kg) IBW/kg (Calculated) : 59.3  Vital Signs: BP: 114/70 (06/15 0700) Pulse Rate: 64 (06/15 0700)  Labs:  Recent Labs  07/20/16 2142  07/21/16 0551 07/21/16 0934 07/21/16 1303 07/21/16 1701 07/22/16 0527  HGB 11.9*  --  12.6  --   --   --   --   HCT 35.9*  --  36.8  --   --   --   --   PLT 172  --  193  --   --   --   --   LABPROT 44.9*  --  54.0*  --   --   --  56.2*  INR >4.01*  --  5.81*  --   --   --  6.11*  CREATININE 2.77*  < > 2.42* 2.21* 2.24* 2.27*  --   < > = values in this interval not displayed.  Estimated Creatinine Clearance: 17.6 mL/min (A) (by C-G formula based on SCr of 2.27 mg/dL (H)).  Medical History: Past Medical History:  Diagnosis Date  . Atrial fibrillation (HCC)   . CAD (coronary artery disease)    Multivessel status post high risk PCI/DES to circumflex 11/2015 (poor candidate for CABG)  . CKD (chronic kidney disease) stage 3, GFR 30-59 ml/min   . DDD (degenerative disc disease), lumbar   . Essential hypertension, benign   . Mixed hyperlipidemia   . Type 2 diabetes mellitus (HCC)    Medications:  Prescriptions Prior to Admission  Medication Sig Dispense Refill Last Dose  . acetaminophen (TYLENOL) 325 MG tablet Take 2 tablets (650 mg total) by mouth every 4 (four) hours as needed for mild pain, moderate pain, fever or headache.   unknown  . ALPRAZolam (XANAX) 0.25 MG tablet Take 0.25 mg by mouth 3 (three) times daily as needed.   07/20/2016 at Unknown time  . amLODipine (NORVASC) 2.5 MG tablet TAKE (1) TABLET BY MOUTH DAILY. 30 tablet 6 07/20/2016 at Unknown time  . atorvastatin (LIPITOR) 40 MG tablet Take 1 tablet (40 mg total) by mouth daily. 90 tablet 3 07/20/2016 at Unknown time  . clopidogrel (PLAVIX) 75 MG tablet Take 1 tablet (75 mg total) by mouth  daily. 90 tablet 3 07/20/2016 at 0800  . glimepiride (AMARYL) 2 MG tablet Take 2 mg by mouth daily.     07/20/2016 at Unknown time  . hydrALAZINE (APRESOLINE) 100 MG tablet Take 1 tablet (100 mg total) by mouth 3 (three) times daily. 270 tablet 3 07/20/2016 at Unknown time  . HYDROcodone-acetaminophen (NORCO/VICODIN) 5-325 MG tablet Take 1 tablet by mouth 4 (four) times daily.    07/20/2016 at Unknown time  . isosorbide mononitrate (IMDUR) 60 MG 24 hr tablet Take 1 tablet (60 mg total) by mouth daily. 90 tablet 3 07/20/2016 at Unknown time  . metoprolol tartrate (LOPRESSOR) 25 MG tablet Take 0.5 tablets (12.5 mg total) by mouth 2 (two) times daily. 90 tablet 3 07/20/2016 at 0800  . nitroGLYCERIN (NITROSTAT) 0.4 MG SL tablet Place 1 tablet (0.4 mg total) under the tongue every 5 (five) minutes as needed for chest pain. 25 tablet 2 unknown  . pantoprazole (PROTONIX) 40 MG tablet Take 1 tablet (40 mg total) by mouth daily at 6 (six) AM. 90 tablet 3 07/20/2016 at Unknown time  . torsemide (DEMADEX) 20 MG tablet Take 1 tablet (20  mg total) by mouth 2 (two) times daily. 60 tablet 12 07/20/2016 at Unknown time  . TRAVATAN Z 0.004 % SOLN ophthalmic solution Place 1 drop into both eyes at bedtime.    07/20/2016 at Unknown time  . warfarin (COUMADIN) 5 MG tablet TAKE 1 TABLET BY MOUTH DAILY EXCEPT 1/2 TABLET ON WEDNESDAYS AND SATURDAYS. 30 tablet 6 07/19/2016 at 2030   Assessment: 83 yo female with hx of afib on anticoagulation with Coumadin. Known CAD, DM, HTN, HLD, presented to the ER after feeling weak and fell. Her blood sugars were extremely elevated and she is severely dehydrated. Will continue with coumadin while managing her hyperosmolar nonketotic hyperglycemia. Pharmacy to dose. INR is SUPRAtherapeutic.  Goal of Therapy:  INR 2-3 Monitor platelets by anticoagulation protocol: Yes   Plan:  No coumadin today Daily PT-INR Monitor for S/S of bleeding  Valrie HartScott Tanique Matney, PharmD Clinical Pharmacist Pager:   [phone] 07/22/2016   07/22/2016,8:08 AM

## 2016-07-22 NOTE — Progress Notes (Signed)
Subjective:  Patient is more alert and awake today. Her blood sugar has improved. She is off insulin drip and on sliding scale. Her po intake has improved. Her INR remained supra therapeutic and being monitored by pharmacy. Objective: Vital signs in last 24 hours: Temp:  [97.4 F (36.3 C)-97.8 F (36.6 C)] 97.8 F (36.6 C) (06/14 1600) Pulse Rate:  [57-72] 64 (06/15 0700) Resp:  [0-27] 13 (06/15 0700) BP: (90-133)/(53-70) 114/70 (06/15 0700) SpO2:  [92 %-99 %] 94 % (06/15 0700) Weight change:  Last BM Date: 07/21/16  Intake/Output from previous day: 06/14 0701 - 06/15 0700 In: 3058.3 [P.O.:940; I.V.:2118.3] Out: 800 [Urine:800]  PHYSICAL EXAM General appearance: fatigued and slowed mentation Resp: clear to auscultation bilaterally Cardio: S1, S2 normal GI: soft, non-tender; bowel sounds normal; no masses,  no organomegaly Extremities: extremities normal, atraumatic, no cyanosis or edema  Lab Results:  Results for orders placed or performed during the hospital encounter of 07/20/16 (from the past 48 hour(s))  CBC with Differential     Status: Abnormal   Collection Time: 07/20/16  9:42 PM  Result Value Ref Range   WBC 10.9 (H) 4.0 - 10.5 K/uL   RBC 3.80 (L) 3.87 - 5.11 MIL/uL   Hemoglobin 11.9 (L) 12.0 - 15.0 g/dL   HCT 35.9 (L) 36.0 - 46.0 %   MCV 94.5 78.0 - 100.0 fL   MCH 31.3 26.0 - 34.0 pg   MCHC 33.1 30.0 - 36.0 g/dL   RDW 13.6 11.5 - 15.5 %   Platelets 172 150 - 400 K/uL   Neutrophils Relative % 87 %   Neutro Abs 9.6 (H) 1.7 - 7.7 K/uL   Lymphocytes Relative 7 %   Lymphs Abs 0.7 0.7 - 4.0 K/uL   Monocytes Relative 6 %   Monocytes Absolute 0.7 0.1 - 1.0 K/uL   Eosinophils Relative 0 %   Eosinophils Absolute 0.0 0.0 - 0.7 K/uL   Basophils Relative 0 %   Basophils Absolute 0.0 0.0 - 0.1 K/uL  Basic metabolic panel     Status: Abnormal   Collection Time: 07/20/16  9:42 PM  Result Value Ref Range   Sodium 121 (L) 135 - 145 mmol/L   Potassium 5.5 (H) 3.5 - 5.1  mmol/L   Chloride 82 (L) 101 - 111 mmol/L   CO2 23 22 - 32 mmol/L   Glucose, Bld 1,183 (HH) 65 - 99 mg/dL    Comment: CRITICAL RESULT CALLED TO, READ BACK BY AND VERIFIED WITH: WINGHAM,C AT 2220 ON 6.13.2018 BY ISLEY,B    BUN 65 (H) 6 - 20 mg/dL   Creatinine, Ser 2.77 (H) 0.44 - 1.00 mg/dL   Calcium 8.5 (L) 8.9 - 10.3 mg/dL   GFR calc non Af Amer 15 (L) >60 mL/min   GFR calc Af Amer 17 (L) >60 mL/min    Comment: (NOTE) The eGFR has been calculated using the CKD EPI equation. This calculation has not been validated in all clinical situations. eGFR's persistently <60 mL/min signify possible Chronic Kidney Disease.    Anion gap 16 (H) 5 - 15  Magnesium     Status: None   Collection Time: 07/20/16  9:42 PM  Result Value Ref Range   Magnesium 2.3 1.7 - 2.4 mg/dL  Protime-INR     Status: Abnormal   Collection Time: 07/20/16  9:42 PM  Result Value Ref Range   Prothrombin Time 44.9 (H) 11.4 - 15.2 seconds   INR >4.01 (HH)     Comment: RESULT  REPEATED AND VERIFIED CRITICAL RESULT CALLED TO, READ BACK BY AND VERIFIED WITH:  BELTON,K @ 2232 ON 07/20/16 BY JUW   TSH     Status: None   Collection Time: 07/20/16  9:42 PM  Result Value Ref Range   TSH 1.170 0.350 - 4.500 uIU/mL    Comment: Performed by a 3rd Generation assay with a functional sensitivity of <=0.01 uIU/mL.  CBG monitoring, ED     Status: Abnormal   Collection Time: 07/21/16 12:21 AM  Result Value Ref Range   Glucose-Capillary >600 (HH) 65 - 99 mg/dL  MRSA PCR Screening     Status: None   Collection Time: 07/21/16 12:42 AM  Result Value Ref Range   MRSA by PCR NEGATIVE NEGATIVE    Comment:        The GeneXpert MRSA Assay (FDA approved for NASAL specimens only), is one component of a comprehensive MRSA colonization surveillance program. It is not intended to diagnose MRSA infection nor to guide or monitor treatment for MRSA infections.   Basic metabolic panel     Status: Abnormal   Collection Time: 07/21/16   1:27 AM  Result Value Ref Range   Sodium 126 (L) 135 - 145 mmol/L   Potassium 4.7 3.5 - 5.1 mmol/L   Chloride 90 (L) 101 - 111 mmol/L   CO2 21 (L) 22 - 32 mmol/L   Glucose, Bld 956 (HH) 65 - 99 mg/dL    Comment: CRITICAL RESULT CALLED TO, READ BACK BY AND VERIFIED WITH:  SMITH,J @ 0210 ON 07/21/16 BY JUW    BUN 59 (H) 6 - 20 mg/dL   Creatinine, Ser 2.52 (H) 0.44 - 1.00 mg/dL   Calcium 8.1 (L) 8.9 - 10.3 mg/dL   GFR calc non Af Amer 17 (L) >60 mL/min   GFR calc Af Amer 19 (L) >60 mL/min    Comment: (NOTE) The eGFR has been calculated using the CKD EPI equation. This calculation has not been validated in all clinical situations. eGFR's persistently <60 mL/min signify possible Chronic Kidney Disease.    Anion gap 15 5 - 15  Glucose, capillary     Status: Abnormal   Collection Time: 07/21/16  1:33 AM  Result Value Ref Range   Glucose-Capillary >600 (HH) 65 - 99 mg/dL   Comment 1 Notify RN   Glucose, capillary     Status: Abnormal   Collection Time: 07/21/16  2:28 AM  Result Value Ref Range   Glucose-Capillary >600 (HH) 65 - 99 mg/dL  Glucose, capillary     Status: Abnormal   Collection Time: 07/21/16  3:26 AM  Result Value Ref Range   Glucose-Capillary >600 (HH) 65 - 99 mg/dL   Comment 1 Notify RN   Glucose, capillary     Status: Abnormal   Collection Time: 07/21/16  4:28 AM  Result Value Ref Range   Glucose-Capillary 570 (HH) 65 - 99 mg/dL   Comment 1 Notify RN    Comment 2 Document in Chart   Glucose, capillary     Status: Abnormal   Collection Time: 07/21/16  5:31 AM  Result Value Ref Range   Glucose-Capillary 470 (H) 65 - 99 mg/dL   Comment 1 Notify RN    Comment 2 Document in Chart   Comprehensive metabolic panel     Status: Abnormal   Collection Time: 07/21/16  5:51 AM  Result Value Ref Range   Sodium 135 135 - 145 mmol/L    Comment: DELTA CHECK NOTED  Potassium 3.7 3.5 - 5.1 mmol/L   Chloride 96 (L) 101 - 111 mmol/L   CO2 23 22 - 32 mmol/L   Glucose, Bld 489  (H) 65 - 99 mg/dL   BUN 58 (H) 6 - 20 mg/dL   Creatinine, Ser 2.42 (H) 0.44 - 1.00 mg/dL   Calcium 8.9 8.9 - 10.3 mg/dL   Total Protein 6.8 6.5 - 8.1 g/dL   Albumin 3.5 3.5 - 5.0 g/dL   AST 42 (H) 15 - 41 U/L   ALT 21 14 - 54 U/L   Alkaline Phosphatase 232 (H) 38 - 126 U/L   Total Bilirubin 1.3 (H) 0.3 - 1.2 mg/dL   GFR calc non Af Amer 17 (L) >60 mL/min   GFR calc Af Amer 20 (L) >60 mL/min    Comment: (NOTE) The eGFR has been calculated using the CKD EPI equation. This calculation has not been validated in all clinical situations. eGFR's persistently <60 mL/min signify possible Chronic Kidney Disease.    Anion gap 16 (H) 5 - 15  CBC     Status: None   Collection Time: 07/21/16  5:51 AM  Result Value Ref Range   WBC 7.6 4.0 - 10.5 K/uL   RBC 4.03 3.87 - 5.11 MIL/uL   Hemoglobin 12.6 12.0 - 15.0 g/dL   HCT 36.8 36.0 - 46.0 %   MCV 91.3 78.0 - 100.0 fL   MCH 31.3 26.0 - 34.0 pg   MCHC 34.2 30.0 - 36.0 g/dL   RDW 13.1 11.5 - 15.5 %   Platelets 193 150 - 400 K/uL  Protime-INR     Status: Abnormal   Collection Time: 07/21/16  5:51 AM  Result Value Ref Range   Prothrombin Time 54.0 (H) 11.4 - 15.2 seconds   INR 5.81 (HH)     Comment: RESULT REPEATED AND VERIFIED CRITICAL RESULT CALLED TO, READ BACK BY AND VERIFIED WITH: HEARN,J AT 1572 ON 07/21/16 BY JTORTORICI   Glucose, capillary     Status: Abnormal   Collection Time: 07/21/16  6:33 AM  Result Value Ref Range   Glucose-Capillary 353 (H) 65 - 99 mg/dL  Glucose, capillary     Status: Abnormal   Collection Time: 07/21/16  7:32 AM  Result Value Ref Range   Glucose-Capillary 250 (H) 65 - 99 mg/dL  Glucose, capillary     Status: Abnormal   Collection Time: 07/21/16  8:33 AM  Result Value Ref Range   Glucose-Capillary 137 (H) 65 - 99 mg/dL  Basic metabolic panel     Status: Abnormal   Collection Time: 07/21/16  9:34 AM  Result Value Ref Range   Sodium 137 135 - 145 mmol/L   Potassium 3.8 3.5 - 5.1 mmol/L   Chloride 100  (L) 101 - 111 mmol/L   CO2 23 22 - 32 mmol/L   Glucose, Bld 106 (H) 65 - 99 mg/dL   BUN 59 (H) 6 - 20 mg/dL   Creatinine, Ser 2.21 (H) 0.44 - 1.00 mg/dL   Calcium 9.1 8.9 - 10.3 mg/dL   GFR calc non Af Amer 19 (L) >60 mL/min   GFR calc Af Amer 23 (L) >60 mL/min    Comment: (NOTE) The eGFR has been calculated using the CKD EPI equation. This calculation has not been validated in all clinical situations. eGFR's persistently <60 mL/min signify possible Chronic Kidney Disease.    Anion gap 14 5 - 15  Glucose, capillary     Status: None   Collection Time:  07/21/16  9:37 AM  Result Value Ref Range   Glucose-Capillary 81 65 - 99 mg/dL  Glucose, capillary     Status: Abnormal   Collection Time: 07/21/16 10:45 AM  Result Value Ref Range   Glucose-Capillary 63 (L) 65 - 99 mg/dL  Glucose, capillary     Status: Abnormal   Collection Time: 07/21/16 11:05 AM  Result Value Ref Range   Glucose-Capillary 107 (H) 65 - 99 mg/dL  Glucose, capillary     Status: Abnormal   Collection Time: 07/21/16 12:10 PM  Result Value Ref Range   Glucose-Capillary 61 (L) 65 - 99 mg/dL  Glucose, capillary     Status: None   Collection Time: 07/21/16 12:32 PM  Result Value Ref Range   Glucose-Capillary 96 65 - 99 mg/dL  Basic metabolic panel     Status: Abnormal   Collection Time: 07/21/16  1:03 PM  Result Value Ref Range   Sodium 137 135 - 145 mmol/L   Potassium 4.2 3.5 - 5.1 mmol/L   Chloride 99 (L) 101 - 111 mmol/L   CO2 28 22 - 32 mmol/L   Glucose, Bld 153 (H) 65 - 99 mg/dL   BUN 57 (H) 6 - 20 mg/dL   Creatinine, Ser 2.24 (H) 0.44 - 1.00 mg/dL   Calcium 9.2 8.9 - 10.3 mg/dL   GFR calc non Af Amer 19 (L) >60 mL/min   GFR calc Af Amer 22 (L) >60 mL/min    Comment: (NOTE) The eGFR has been calculated using the CKD EPI equation. This calculation has not been validated in all clinical situations. eGFR's persistently <60 mL/min signify possible Chronic Kidney Disease.    Anion gap 10 5 - 15  Glucose,  capillary     Status: Abnormal   Collection Time: 07/21/16  1:47 PM  Result Value Ref Range   Glucose-Capillary 213 (H) 65 - 99 mg/dL  Glucose, capillary     Status: Abnormal   Collection Time: 07/21/16  2:49 PM  Result Value Ref Range   Glucose-Capillary 233 (H) 65 - 99 mg/dL  Glucose, capillary     Status: Abnormal   Collection Time: 07/21/16  3:53 PM  Result Value Ref Range   Glucose-Capillary 189 (H) 65 - 99 mg/dL  Glucose, capillary     Status: Abnormal   Collection Time: 07/21/16  4:56 PM  Result Value Ref Range   Glucose-Capillary 145 (H) 65 - 99 mg/dL  Basic metabolic panel     Status: Abnormal   Collection Time: 07/21/16  5:01 PM  Result Value Ref Range   Sodium 135 135 - 145 mmol/L   Potassium 4.1 3.5 - 5.1 mmol/L   Chloride 101 101 - 111 mmol/L   CO2 23 22 - 32 mmol/L   Glucose, Bld 172 (H) 65 - 99 mg/dL   BUN 57 (H) 6 - 20 mg/dL   Creatinine, Ser 2.27 (H) 0.44 - 1.00 mg/dL   Calcium 9.0 8.9 - 10.3 mg/dL   GFR calc non Af Amer 19 (L) >60 mL/min   GFR calc Af Amer 22 (L) >60 mL/min    Comment: (NOTE) The eGFR has been calculated using the CKD EPI equation. This calculation has not been validated in all clinical situations. eGFR's persistently <60 mL/min signify possible Chronic Kidney Disease.    Anion gap 11 5 - 15  Glucose, capillary     Status: Abnormal   Collection Time: 07/21/16  9:52 PM  Result Value Ref Range   Glucose-Capillary 152 (  H) 65 - 99 mg/dL  Glucose, capillary     Status: Abnormal   Collection Time: 07/22/16  4:02 AM  Result Value Ref Range   Glucose-Capillary 54 (L) 65 - 99 mg/dL  Glucose, capillary     Status: Abnormal   Collection Time: 07/22/16  5:22 AM  Result Value Ref Range   Glucose-Capillary 175 (H) 65 - 99 mg/dL  Protime-INR     Status: Abnormal   Collection Time: 07/22/16  5:27 AM  Result Value Ref Range   Prothrombin Time 56.2 (H) 11.4 - 15.2 seconds   INR 6.11 (HH)     Comment: RESULT REPEATED AND VERIFIED CRITICAL RESULT  CALLED TO, READ BACK BY AND VERIFIED WITH: HEARN,J _0  07/22/16 BY JTORTORICI   Glucose, capillary     Status: Abnormal   Collection Time: 07/22/16  7:31 AM  Result Value Ref Range   Glucose-Capillary 177 (H) 65 - 99 mg/dL    ABGS No results for input(s): PHART, PO2ART, TCO2, HCO3 in the last 72 hours.  Invalid input(s): PCO2 CULTURES Recent Results (from the past 240 hour(s))  MRSA PCR Screening     Status: None   Collection Time: 07/21/16 12:42 AM  Result Value Ref Range Status   MRSA by PCR NEGATIVE NEGATIVE Final    Comment:        The GeneXpert MRSA Assay (FDA approved for NASAL specimens only), is one component of a comprehensive MRSA colonization surveillance program. It is not intended to diagnose MRSA infection nor to guide or monitor treatment for MRSA infections.    Studies/Results: Dg Lumbar Spine Complete  Result Date: 07/20/2016 CLINICAL DATA:  Fall with pain EXAM: LUMBAR SPINE - COMPLETE 4+ VIEW COMPARISON:  None. FINDINGS: Large calcified pelvic masses. Aortic atherosclerosis. Limited details on the lateral views. Suspect that there may be grade 1 anterolisthesis of L4 on L5. Vertebral body heights are grossly maintained. Moderate-to-marked degenerative disc changes at L2-L3 and L3-L4. Moderate changes at L5-S1 and L1-L2. IMPRESSION: Degenerative changes.  No definite acute osseous abnormality. Electronically Signed   By: Donavan Foil M.D.   On: 07/20/2016 18:59   Ct Head Wo Contrast  Result Date: 07/20/2016 CLINICAL DATA:  Spastic movement of right arm, history of a fall EXAM: CT HEAD WITHOUT CONTRAST TECHNIQUE: Contiguous axial images were obtained from the base of the skull through the vertex without intravenous contrast. COMPARISON:  None. FINDINGS: Brain: Motion artifact limits the exam. No acute territorial infarction, hemorrhage or intracranial mass is seen. Mild to moderate periventricular and deep white matter small vessel ischemic changes. Mild  atrophy. Ventricles within normal limits. Vascular: No hyperdense vessels.  Carotid artery calcifications. Skull: No fracture or suspicious bone lesion Sinuses/Orbits: No acute finding. Other: None IMPRESSION: The examination is slightly degraded by patient motion. No acute intracranial abnormality. Atrophy and small vessel ischemic changes of the white matter. Electronically Signed   By: Donavan Foil M.D.   On: 07/20/2016 22:35   Ct Hip Right Wo Contrast  Result Date: 07/20/2016 CLINICAL DATA:  Right hip pain.  Fall today. EXAM: CT OF THE RIGHT HIP WITHOUT CONTRAST TECHNIQUE: Multidetector CT imaging of the right hip was performed according to the standard protocol. Multiplanar CT image reconstructions were also generated. COMPARISON:  12/19/2002 and 07/20/2016 radiographs FINDINGS: Despite efforts by the technologist and patient, motion artifact is present on today's exam and could not be eliminated. This reduces exam sensitivity and specificity. Bones/Joint/Cartilage Cortical thickening and trabecular thickening in the right iliac bone, acetabulum,  and ischium characteristic of Paget' s disease of bone. Mild sclerosis along the right sacral ala adjacent to the sacroiliac joint suggesting mild sacroiliitis. Spurring of the right femoral head and acetabulum. Accounting for the motion artifact as best I can, I do not observe a definite fracture involving the right proximal femur or regional right hemipelvis. Moderate loss of chondral thickness in the right hip. Faint chondrocalcinosis. Subtle irregularity of the sacrococcygeal junction anteriorly for example on image 14/8, this could be chronic. Ligaments Suboptimally assessed by CT. Muscles and Tendons We partially include a lipoma of the right semi tendinosis muscle. Soft tissues Iliac and femoral artery atherosclerotic calcification. Subcutaneous edema overlies the right hip. There is some confluent edema along the right perineum and upper medial thigh on  images 94-111 series 3. Similar findings are suspected on the left, for example image 99/3. Multiple large calcified uterine fibroids are present. IMPRESSION: 1. No fracture identified. Sensitivity for subtle fractures reduced by motion artifact. 2. Paget's disease of the right hemipelvis. 3. Mild right sacroiliitis. 4. Moderate chondral thinning in the right hip with faint chondrocalcinosis potentially reflecting CPPD arthropathy. 5. Subtle irregularity of the sacrococcygeal junction, probably incidental/chronic. 6. Incidental lipoma of the right semitendinosus muscle. 7. Considerable atherosclerosis. 8. There is subcutaneous edema laterally overlying the right hip, and more confluent subcutaneous edema along the perineum bilaterally. 9. Multiple large calcified uterine fibroids. Electronically Signed   By: Van Clines M.D.   On: 07/20/2016 20:18   Dg Chest Port 1 View  Result Date: 07/20/2016 CLINICAL DATA:  Status post fall, with concern for chest injury. Initial encounter. EXAM: PORTABLE CHEST 1 VIEW COMPARISON:  Chest radiograph performed 11/22/2015, and CT of the chest performed 11/27/2015 FINDINGS: The lungs are well-aerated. Peribronchial thickening is noted. Mild left basilar airspace opacity likely reflects atelectasis. There is no evidence of pleural effusion or pneumothorax. The cardiomediastinal silhouette is mildly enlarged. No acute osseous abnormalities are seen. IMPRESSION: 1. No displaced rib fracture seen. 2. Peribronchial thickening noted. Mild left basilar airspace opacity likely reflects atelectasis. Electronically Signed   By: Garald Balding M.D.   On: 07/20/2016 23:32   Dg Hand Complete Right  Result Date: 07/20/2016 CLINICAL DATA:  Fall this morning with right hand pain, initial encounter EXAM: RIGHT HAND - COMPLETE 3+ VIEW COMPARISON:  None. FINDINGS: Some lucency is noted within the triquetrum on the lateral projection. It would be difficult to exclude an undisplaced  fracture. No other fracture is seen. Mild degenerative changes in the interphalangeal joints are noted. IMPRESSION: Question triquetrum fracture Electronically Signed   By: Inez Catalina M.D.   On: 07/20/2016 15:28   Dg Hip Unilat With Pelvis 2-3 Views Right  Result Date: 07/20/2016 CLINICAL DATA:  Fall with pain in the right hip EXAM: DG HIP (WITH OR WITHOUT PELVIS) 2-3V RIGHT COMPARISON:  None. FINDINGS: Status post left hip replacement with normal alignment. Pubic symphysis is intact. Mild to moderate degenerative changes of the right hip. No fracture or dislocation is seen. Large calcified masses in the pelvis, likely fibroids. Vascular calcification. IMPRESSION: 1. No definite acute osseous abnormality. Mild to moderate degenerative changes of the right hip 2. Status post left hip replacement with normal alignment 3. Large calcified pelvic masses are probably fibroids. Electronically Signed   By: Donavan Foil M.D.   On: 07/20/2016 18:56    Medications: I have reviewed the patient's current medications.  Assesment:  Principal Problem:   Type 2 diabetes mellitus with hyperosmolar nonketotic hyperglycemia (HCC) Active  Problems:   Long term current use of anticoagulant   CHF (congestive heart failure) (HCC)   Pulmonary hypertension (HCC)   Volume depletion   Myoclonic jerking   Hyperosmolarity syndrome   Hand fracture, right    Plan:  Medications reviewed Continue sliding scale and lantus PT/INR to be monitored by pharmacy Nutritional support.   LOS: 2 days   , 07/22/2016, 8:01 AM

## 2016-07-22 NOTE — Progress Notes (Addendum)
Hypoglycemic Event  CBG: 54  Treatment: 2 orange juice cups, glass of milk and 2 packs of graham crackers and peanut butter  Symptoms: no acute s/s Pt alert and oriented, can answer questions correctly and can follow commands.Will follow up.  Follow-up CBG: Time:0520 CBG Result:175  Possible Reasons for Event:  Not eating as much during hospital stay, pt states her sugar drops at times during the night which is why RN rechecked at 0400 am rounds.  Comments/MD notified:n/a    Margit Batte Shelia [employer] Amen Staszak

## 2016-07-22 NOTE — Progress Notes (Addendum)
Inpatient Diabetes Program Recommendations  AACE/ADA: New Consensus Statement on Inpatient Glycemic Control (2015)  Target Ranges:  Prepandial:   less than 140 mg/dL      Peak postprandial:   less than 180 mg/dL (1-2 hours)      Critically ill patients:  140 - 180 mg/dL   Results for Melissa [name], Melissa [name] (MRN [id number]) as of 07/22/2016 08:06  Ref. Range 07/21/2016 11:05 07/21/2016 12:10 07/21/2016 12:32 07/21/2016 13:47 07/21/2016 14:49 07/21/2016 15:53 07/21/2016 16:56 07/21/2016 21:52 07/22/2016 04:02 07/22/2016 05:22 07/22/2016 07:31  Glucose-Capillary Latest Ref Range: 65 - 99 mg/dL 107 (H) 61 (L) 96 213 (H) 233 (H) 189 (H) 145 (H) 152 (H) 54 (L) 175 (H) 177 (H)  Results for Melissa [name], Melissa [name] (MRN [id number]) as of 07/22/2016 08:06  Ref. Range 07/20/2016 21:42 07/21/2016 01:27 07/21/2016 05:51  Glucose Latest Ref Range: 65 - 99 mg/dL 1,183 (HH) 956 (HH) 489 (H)   Review of Glycemic Control  Diabetes history: DM2 Outpatient Diabetes medications: Amaryl 2 mg daily Current orders for Inpatient glycemic control: Levemir 20 units QHS, Novolog 0-15 units TID with meals, Novolog 0-5 units QHS  Inpatient Diabetes Program Recommendations: Insulin - Basal: Patient received Levemir 10 units at 14:53 and Levemir 20 units at 22:00 on 07/21/16. Glucose down to 54 mg/dl at 4:02 am. Patient is currently ordered Levemir 20 units QHS which is approximately equivalent to 62 kg x 0.3 units. HgbA1C: Please add on an A1C to blood in lab to evaluate glycemic control over the past 2-3 months. Outpatient Plan for DM: MD, please note if patient will be placed on insulin as an outpatient.  Addendum 07/22/16'@11' :32-Diabetes Coordinator is not on [facility] today and assessing patients remotely. Spoke with Purcell Nails, RN regarding outpatient DM management plan and Dr. Legrand Rams covering for Dr. Luan Pulling. At this point, plan is to wait and let Dr. Luan Pulling decide outpatient regimen for DM control when he returns on Monday. Asked that bedside  RNs begin educating patient and family on insulin administration in case patient is discharged on insulin. Ordered Living Well with Diabetes booklet and an insulin starter kit. Diabetes Coordinator will continue to follow.  Thanks, Barnie Alderman, RN, MSN, CDE Diabetes Coordinator Inpatient Diabetes Program [phone] (Team Pager from 8am to 5pm)

## 2016-07-23 LAB — GLUCOSE, CAPILLARY
GLUCOSE-CAPILLARY: 210 mg/dL — AB (ref 65–99)
Glucose-Capillary: 226 mg/dL — ABNORMAL HIGH (ref 65–99)
Glucose-Capillary: 250 mg/dL — ABNORMAL HIGH (ref 65–99)
Glucose-Capillary: 322 mg/dL — ABNORMAL HIGH (ref 65–99)

## 2016-07-23 LAB — CBC
HCT: 31.9 % — ABNORMAL LOW (ref 36.0–46.0)
Hemoglobin: 10.9 g/dL — ABNORMAL LOW (ref 12.0–15.0)
MCH: 31.6 pg (ref 26.0–34.0)
MCHC: 34.2 g/dL (ref 30.0–36.0)
MCV: 92.5 fL (ref 78.0–100.0)
PLATELETS: 156 10*3/uL (ref 150–400)
RBC: 3.45 MIL/uL — AB (ref 3.87–5.11)
RDW: 13.8 % (ref 11.5–15.5)
WBC: 8.9 10*3/uL (ref 4.0–10.5)

## 2016-07-23 LAB — PROTIME-INR
INR: 4.71
PROTHROMBIN TIME: 45.3 s — AB (ref 11.4–15.2)

## 2016-07-23 NOTE — Progress Notes (Signed)
ANTICOAGULATION CONSULT NOTE   Pharmacy Consult for Coumadin Indication: atrial fibrillation  No Known Allergies  Patient Measurements: Height: 5\' 6"  (167.6 cm) Weight: 137 lb 5.6 oz (62.3 kg) IBW/kg (Calculated) : 59.3  Vital Signs: Temp: 98.3 F (36.8 C) (06/16 0801) Temp Source: Oral (06/16 0801) BP: 132/63 (06/16 0600) Pulse Rate: 47 (06/16 0600)  Labs:  Recent Labs  07/20/16 2142  07/21/16 0551 07/21/16 0934 07/21/16 1303 07/21/16 1701 07/22/16 0527 07/23/16 0438  HGB 11.9*  --  12.6  --   --   --   --  10.9*  HCT 35.9*  --  36.8  --   --   --   --  31.9*  PLT 172  --  193  --   --   --   --  156  LABPROT 44.9*  --  54.0*  --   --   --  56.2* 45.3*  INR >4.01*  --  5.81*  --   --   --  6.11* 4.71*  CREATININE 2.77*  < > 2.42* 2.21* 2.24* 2.27*  --   --   < > = values in this interval not displayed.  Estimated Creatinine Clearance: 17.6 mL/min (A) (by C-G formula based on SCr of 2.27 mg/dL (H)).  Medical History: Past Medical History:  Diagnosis Date  . Atrial fibrillation (HCC)   . CAD (coronary artery disease)    Multivessel status post high risk PCI/DES to circumflex 11/2015 (poor candidate for CABG)  . CKD (chronic kidney disease) stage 3, GFR 30-59 ml/min   . DDD (degenerative disc disease), lumbar   . Essential hypertension, benign   . Mixed hyperlipidemia   . Type 2 diabetes mellitus (HCC)    Medications:  Prescriptions Prior to Admission  Medication Sig Dispense Refill Last Dose  . acetaminophen (TYLENOL) 325 MG tablet Take 2 tablets (650 mg total) by mouth every 4 (four) hours as needed for mild pain, moderate pain, fever or headache.   unknown  . ALPRAZolam (XANAX) 0.25 MG tablet Take 0.25 mg by mouth 3 (three) times daily as needed.   07/20/2016 at Unknown time  . amLODipine (NORVASC) 2.5 MG tablet TAKE (1) TABLET BY MOUTH DAILY. 30 tablet 6 07/20/2016 at Unknown time  . atorvastatin (LIPITOR) 40 MG tablet Take 1 tablet (40 mg total) by mouth  daily. 90 tablet 3 07/20/2016 at Unknown time  . clopidogrel (PLAVIX) 75 MG tablet Take 1 tablet (75 mg total) by mouth daily. 90 tablet 3 07/20/2016 at 0800  . glimepiride (AMARYL) 2 MG tablet Take 2 mg by mouth daily.     07/20/2016 at Unknown time  . hydrALAZINE (APRESOLINE) 100 MG tablet Take 1 tablet (100 mg total) by mouth 3 (three) times daily. 270 tablet 3 07/20/2016 at Unknown time  . HYDROcodone-acetaminophen (NORCO/VICODIN) 5-325 MG tablet Take 1 tablet by mouth 4 (four) times daily.    07/20/2016 at Unknown time  . isosorbide mononitrate (IMDUR) 60 MG 24 hr tablet Take 1 tablet (60 mg total) by mouth daily. 90 tablet 3 07/20/2016 at Unknown time  . metoprolol tartrate (LOPRESSOR) 25 MG tablet Take 0.5 tablets (12.5 mg total) by mouth 2 (two) times daily. 90 tablet 3 07/20/2016 at 0800  . nitroGLYCERIN (NITROSTAT) 0.4 MG SL tablet Place 1 tablet (0.4 mg total) under the tongue every 5 (five) minutes as needed for chest pain. 25 tablet 2 unknown  . pantoprazole (PROTONIX) 40 MG tablet Take 1 tablet (40 mg total) by mouth daily  at 6 (six) AM. 90 tablet 3 07/20/2016 at Unknown time  . torsemide (DEMADEX) 20 MG tablet Take 1 tablet (20 mg total) by mouth 2 (two) times daily. 60 tablet 12 07/20/2016 at Unknown time  . TRAVATAN Z 0.004 % SOLN ophthalmic solution Place 1 drop into both eyes at bedtime.    07/20/2016 at Unknown time  . warfarin (COUMADIN) 5 MG tablet TAKE 1 TABLET BY MOUTH DAILY EXCEPT 1/2 TABLET ON WEDNESDAYS AND SATURDAYS. 30 tablet 6 07/19/2016 at 2030   Assessment: 83 yo female with hx of afib on anticoagulation with Coumadin. Known CAD, DM, HTN, HLD, presented to the ER after feeling weak and fell. Her blood sugars were extremely elevated and she is severely dehydrated. Will continue with coumadin while managing her hyperosmolar nonketotic hyperglycemia. Pharmacy to dose. INR is SUPRAtherapeutic but is starting to trend down.  Goal of Therapy:  INR 2-3 Monitor platelets by  anticoagulation protocol: Yes   Plan:  No coumadin today, allow INR to trend down Daily PT-INR Monitor for S/S of bleeding  Valrie Hart, PharmD Clinical Pharmacist Pager:  [phone] 07/23/2016   07/23/2016,8:19 AM

## 2016-07-23 NOTE — Progress Notes (Signed)
Subjective: She states she is feeling better today.   Objective: Vital signs in last 24 hours: Vitals:   07/23/16 0400 07/23/16 0500 07/23/16 0600 07/23/16 0801  BP: (!) 143/58 127/66 132/63   Pulse: (!) 48 (!) 47 (!) 47   Resp: 14 14 14    Temp:    98.3 F (36.8 C)  TempSrc:    Oral  SpO2: 97% 97% 94% 97%  Weight:      Height:       Weight change:   Intake/Output Summary (Last 24 hours) at 07/23/16 0907 Last data filed at 07/23/16 0600  Gross per 24 hour  Intake          3184.67 ml  Output                0 ml  Net          3184.67 ml    Physical Exam: Awake and alert. Oriented to month and year. Lungs clear. Heart irregularly irregular. Monitor reveals atrial fibrillation with a rate in the 60 range. Abdomen is soft and nontender. Extremities reveal no edema.ab Results:    Results for orders placed or performed during the hospital encounter of 07/20/16 (from the past 24 hour(s))  Glucose, capillary     Status: Abnormal   Collection Time: 07/22/16 11:54 AM  Result Value Ref Range   Glucose-Capillary 223 (H) 65 - 99 mg/dL  Glucose, capillary     Status: Abnormal   Collection Time: 07/22/16  4:56 PM  Result Value Ref Range   Glucose-Capillary 154 (H) 65 - 99 mg/dL  Glucose, capillary     Status: Abnormal   Collection Time: 07/22/16  8:31 PM  Result Value Ref Range   Glucose-Capillary 284 (H) 65 - 99 mg/dL  Protime-INR     Status: Abnormal   Collection Time: 07/23/16  4:38 AM  Result Value Ref Range   Prothrombin Time 45.3 (H) 11.4 - 15.2 seconds   INR 4.71 (HH)   CBC     Status: Abnormal   Collection Time: 07/23/16  4:38 AM  Result Value Ref Range   WBC 8.9 4.0 - 10.5 K/uL   RBC 3.45 (L) 3.87 - 5.11 MIL/uL   Hemoglobin 10.9 (L) 12.0 - 15.0 g/dL   HCT 16.131.9 (L) 09.636.0 - 04.546.0 %   MCV 92.5 78.0 - 100.0 fL   MCH 31.6 26.0 - 34.0 pg   MCHC 34.2 30.0 - 36.0 g/dL   RDW 40.913.8 81.111.5 - 91.415.5 %   Platelets 156 150 - 400 K/uL  Glucose, capillary     Status: Abnormal   Collection Time: 07/23/16  7:31 AM  Result Value Ref Range   Glucose-Capillary 210 (H) 65 - 99 mg/dL     ABGS No results for input(s): PHART, PO2ART, TCO2, HCO3 in the last 72 hours.  Invalid input(s): PCO2 CULTURES Recent Results (from the past 240 hour(s))  MRSA PCR Screening     Status: None   Collection Time: 07/21/16 12:42 AM  Result Value Ref Range Status   MRSA by PCR NEGATIVE NEGATIVE Final    Comment:        The GeneXpert MRSA Assay (FDA approved for NASAL specimens only), is one component of a comprehensive MRSA colonization surveillance program. It is not intended to diagnose MRSA infection nor to guide or monitor treatment for MRSA infections.    Studies/Results: No results found. Micro Results: Recent Results (from the past 240 hour(s))  MRSA PCR Screening  Status: None   Collection Time: 07/21/16 12:42 AM  Result Value Ref Range Status   MRSA by PCR NEGATIVE NEGATIVE Final    Comment:        The GeneXpert MRSA Assay (FDA approved for NASAL specimens only), is one component of a comprehensive MRSA colonization surveillance program. It is not intended to diagnose MRSA infection nor to guide or monitor treatment for MRSA infections.    Studies/Results: No results found. Medications:  I have reviewed the patient's current medications Scheduled Meds: . amLODipine  2.5 mg Oral Daily  . atorvastatin  40 mg Oral Daily  . chlorhexidine  15 mL Mouth Rinse BID  . clopidogrel  75 mg Oral Daily  . hydrALAZINE  100 mg Oral TID  . insulin aspart  0-15 Units Subcutaneous TID WC  . insulin aspart  0-5 Units Subcutaneous QHS  . insulin detemir  20 Units Subcutaneous QHS  . isosorbide mononitrate  60 mg Oral Daily  . latanoprost  1 drop Both Eyes QHS  . mouth rinse  15 mL Mouth Rinse q12n4p  . metoprolol tartrate  12.5 mg Oral BID  . pantoprazole  40 mg Oral Q0600  . sodium chloride flush  3 mL Intravenous Q12H  . Warfarin - Pharmacist Dosing Inpatient    Does not apply q1800   Continuous Infusions: PRN Meds:.acetaminophen   Assessment/Plan:incipal Problem:   Type 2 diabetes mellitus with hyperosmolar nonketotic hyperglycemia (HCC) Active Problems:   Long term current use of anticoagulant   CHF (congestive heart failure) (HCC)   Pulmonary hypertension (HCC)   Volume depletion   Myoclonic jerking   Hyperosmolarity syndrome   Hand fracture, right   impression: #1. Diabetes. Glucose markedly improved following treatment with a drop from 1183-210 this morning. Continue Levemir and NovoLog. She had an intake of 3424 mL yesterday. We'll discontinue IV fluids today. Continue current diet. She can move from the ICU. #2. Chronic atrial fibrillation. INR remains elevated at 4.71. Continue holding Coumadin. Metoprolol has been held as well with heart rates in the 40s and 50s at times. #3. Chronic kidney disease. BUN and creatinine were 51 and 1.8 last November it appears. BUN and creatinine were 57 and 2.272 days ago. Repeat metabolic panel tomorrow. #4. Question right triquetrum fracture. Orthopedic consult appears to remain pending. Splint in place.    LOS: 3 days   Melissa [name] 07/23/2016, 9:07 AM

## 2016-07-24 ENCOUNTER — Inpatient Hospital Stay (HOSPITAL_COMMUNITY): Payer: Medicare Other

## 2016-07-24 LAB — PROTIME-INR
INR: 2.56
PROTHROMBIN TIME: 28 s — AB (ref 11.4–15.2)

## 2016-07-24 LAB — BASIC METABOLIC PANEL
Anion gap: 5 (ref 5–15)
BUN: 52 mg/dL — AB (ref 6–20)
CO2: 25 mmol/L (ref 22–32)
CREATININE: 1.67 mg/dL — AB (ref 0.44–1.00)
Calcium: 8.5 mg/dL — ABNORMAL LOW (ref 8.9–10.3)
Chloride: 104 mmol/L (ref 101–111)
GFR calc Af Amer: 32 mL/min — ABNORMAL LOW (ref >60)
GFR, EST NON AFRICAN AMERICAN: 27 mL/min — AB (ref >60)
GLUCOSE: 190 mg/dL — AB (ref 65–99)
POTASSIUM: 3.9 mmol/L (ref 3.5–5.1)
Sodium: 134 mmol/L — ABNORMAL LOW (ref 135–145)

## 2016-07-24 LAB — GLUCOSE, CAPILLARY
GLUCOSE-CAPILLARY: 144 mg/dL — AB (ref 65–99)
GLUCOSE-CAPILLARY: 167 mg/dL — AB (ref 65–99)
Glucose-Capillary: 297 mg/dL — ABNORMAL HIGH (ref 65–99)
Glucose-Capillary: 149 mg/dL — ABNORMAL HIGH (ref 65–99)
Glucose-Capillary: 208 mg/dL — ABNORMAL HIGH (ref 65–99)

## 2016-07-24 LAB — HEMOGLOBIN A1C

## 2016-07-24 MED ORDER — WARFARIN SODIUM 1 MG PO TABS
1.0000 mg | ORAL_TABLET | Freq: Once | ORAL | Status: AC
  Administered 2016-07-24: 1 mg via ORAL
  Filled 2016-07-24: qty 1

## 2016-07-24 MED ORDER — COLCHICINE 0.6 MG PO TABS
0.6000 mg | ORAL_TABLET | Freq: Two times a day (BID) | ORAL | Status: DC
  Administered 2016-07-24 – 2016-07-25 (×4): 0.6 mg via ORAL
  Filled 2016-07-24 (×4): qty 1

## 2016-07-24 NOTE — Progress Notes (Signed)
ANTICOAGULATION CONSULT NOTE   Pharmacy Consult for Coumadin Indication: atrial fibrillation  No Known Allergies  Patient Measurements: Height: 5\' 6"  (167.6 cm) Weight: 137 lb 5.6 oz (62.3 kg) IBW/kg (Calculated) : 59.3  Vital Signs: Temp: 98.6 F (37 C) (06/17 0602) Temp Source: Oral (06/17 0602) BP: 115/87 (06/17 0602) Pulse Rate: 95 (06/17 0602)  Labs:  Recent Labs  07/21/16 1303 07/21/16 1701 07/22/16 0527 07/23/16 0438 07/24/16 0631  HGB  --   --   --  10.9*  --   HCT  --   --   --  31.9*  --   PLT  --   --   --  156  --   LABPROT  --   --  56.2* 45.3* 28.0*  INR  --   --  6.11* 4.71* 2.56  CREATININE 2.24* 2.27*  --   --  1.67*   Estimated Creatinine Clearance: 23.9 mL/min (A) (by C-G formula based on SCr of 1.67 mg/dL (H)).  Medical History: Past Medical History:  Diagnosis Date  . Atrial fibrillation (HCC)   . CAD (coronary artery disease)    Multivessel status post high risk PCI/DES to circumflex 11/2015 (poor candidate for CABG)  . CKD (chronic kidney disease) stage 3, GFR 30-59 ml/min   . DDD (degenerative disc disease), lumbar   . Essential hypertension, benign   . Mixed hyperlipidemia   . Type 2 diabetes mellitus (HCC)    Medications:  Prescriptions Prior to Admission  Medication Sig Dispense Refill Last Dose  . acetaminophen (TYLENOL) 325 MG tablet Take 2 tablets (650 mg total) by mouth every 4 (four) hours as needed for mild pain, moderate pain, fever or headache.   unknown  . ALPRAZolam (XANAX) 0.25 MG tablet Take 0.25 mg by mouth 3 (three) times daily as needed.   07/20/2016 at Unknown time  . amLODipine (NORVASC) 2.5 MG tablet TAKE (1) TABLET BY MOUTH DAILY. 30 tablet 6 07/20/2016 at Unknown time  . atorvastatin (LIPITOR) 40 MG tablet Take 1 tablet (40 mg total) by mouth daily. 90 tablet 3 07/20/2016 at Unknown time  . clopidogrel (PLAVIX) 75 MG tablet Take 1 tablet (75 mg total) by mouth daily. 90 tablet 3 07/20/2016 at 0800  . glimepiride  (AMARYL) 2 MG tablet Take 2 mg by mouth daily.     07/20/2016 at Unknown time  . hydrALAZINE (APRESOLINE) 100 MG tablet Take 1 tablet (100 mg total) by mouth 3 (three) times daily. 270 tablet 3 07/20/2016 at Unknown time  . HYDROcodone-acetaminophen (NORCO/VICODIN) 5-325 MG tablet Take 1 tablet by mouth 4 (four) times daily.    07/20/2016 at Unknown time  . isosorbide mononitrate (IMDUR) 60 MG 24 hr tablet Take 1 tablet (60 mg total) by mouth daily. 90 tablet 3 07/20/2016 at Unknown time  . metoprolol tartrate (LOPRESSOR) 25 MG tablet Take 0.5 tablets (12.5 mg total) by mouth 2 (two) times daily. 90 tablet 3 07/20/2016 at 0800  . nitroGLYCERIN (NITROSTAT) 0.4 MG SL tablet Place 1 tablet (0.4 mg total) under the tongue every 5 (five) minutes as needed for chest pain. 25 tablet 2 unknown  . pantoprazole (PROTONIX) 40 MG tablet Take 1 tablet (40 mg total) by mouth daily at 6 (six) AM. 90 tablet 3 07/20/2016 at Unknown time  . torsemide (DEMADEX) 20 MG tablet Take 1 tablet (20 mg total) by mouth 2 (two) times daily. 60 tablet 12 07/20/2016 at Unknown time  . TRAVATAN Z 0.004 % SOLN ophthalmic solution Place 1  drop into both eyes at bedtime.    07/20/2016 at Unknown time  . warfarin (COUMADIN) 5 MG tablet TAKE 1 TABLET BY MOUTH DAILY EXCEPT 1/2 TABLET ON WEDNESDAYS AND SATURDAYS. 30 tablet 6 07/19/2016 at 2030   Assessment: 83 yo female with hx of afib on anticoagulation with Coumadin. Known CAD, DM, HTN, HLD, presented to the ER after feeling weak and fell. She is improving clinically. INR is now therapeutic after holding Coumadin.  INR continues to trend down, will give Coumadin 1mg  today to discourage too rapid a drop. No bleeding reported.   Goal of Therapy:  INR 2-3 Monitor platelets by anticoagulation protocol: Yes   Plan:  Coumadin 1mg  today x 1 Daily PT-INR Monitor for S/S of bleeding  Valrie Hart, PharmD Clinical Pharmacist Pager:  [phone] 07/24/2016   07/24/2016,8:21 AM

## 2016-07-24 NOTE — Progress Notes (Signed)
Subjective: She complains of right foot pain overnight. She states the pain was so bad she could hardly stand that she eats to touch it. She had fallen at home prior to her admission but had not had pain in the foot previously. She denies any history of gout.  Objective: Vital signs in last 24 hours: Vitals:   07/23/16 2022 07/23/16 2149 07/24/16 0602 07/24/16 0841  BP:  (!) 141/73 115/87 (!) 128/55  Pulse:  (!) 57 95 (!) 44  Resp:  18 18   Temp:  98.8 F (37.1 C) 98.6 F (37 C)   TempSrc:  Oral Oral   SpO2: 95% 98% 99%   Weight:      Height:       Weight change:   Intake/Output Summary (Last 24 hours) at 07/24/16 0955 Last data filed at 07/24/16 0900  Gross per 24 hour  Intake              123 ml  Output             1851 ml  Net            -1728 ml    Physical Exam: Alert. Lungs clear. Heart irregularly irregular in the 50 range. Abdomen soft and nontender with no hepatosplenomegaly. Extremities reveal no edema. The right foot is exquisitely tender in the instep but there is no significant swelling or erythema.  Lab Results:    Results for orders placed or performed during the hospital encounter of 07/20/16 (from the past 24 hour(s))  Glucose, capillary     Status: Abnormal   Collection Time: 07/23/16 11:42 AM  Result Value Ref Range   Glucose-Capillary 226 (H) 65 - 99 mg/dL  Glucose, capillary     Status: Abnormal   Collection Time: 07/23/16  4:11 PM  Result Value Ref Range   Glucose-Capillary 250 (H) 65 - 99 mg/dL   Comment 1 Notify RN   Glucose, capillary     Status: Abnormal   Collection Time: 07/23/16  9:52 PM  Result Value Ref Range   Glucose-Capillary 322 (H) 65 - 99 mg/dL   Comment 1 Notify RN    Comment 2 Document in Chart   Glucose, capillary     Status: Abnormal   Collection Time: 07/24/16  3:25 AM  Result Value Ref Range   Glucose-Capillary 297 (H) 65 - 99 mg/dL   Comment 1 Notify RN    Comment 2 Document in Chart   Protime-INR     Status: Abnormal    Collection Time: 07/24/16  6:31 AM  Result Value Ref Range   Prothrombin Time 28.0 (H) 11.4 - 15.2 seconds   INR 2.56   Basic metabolic panel     Status: Abnormal   Collection Time: 07/24/16  6:31 AM  Result Value Ref Range   Sodium 134 (L) 135 - 145 mmol/L   Potassium 3.9 3.5 - 5.1 mmol/L   Chloride 104 101 - 111 mmol/L   CO2 25 22 - 32 mmol/L   Glucose, Bld 190 (H) 65 - 99 mg/dL   BUN 52 (H) 6 - 20 mg/dL   Creatinine, Ser 2.35 (H) 0.44 - 1.00 mg/dL   Calcium 8.5 (L) 8.9 - 10.3 mg/dL   GFR calc non Af Amer 27 (L) >60 mL/min   GFR calc Af Amer 32 (L) >60 mL/min   Anion gap 5 5 - 15  Glucose, capillary     Status: Abnormal   Collection Time: 07/24/16  7:37 AM  Result Value Ref Range   Glucose-Capillary 144 (H) 65 - 99 mg/dL   Comment 1 Notify RN      ABGS No results for input(s): PHART, PO2ART, TCO2, HCO3 in the last 72 hours.  Invalid input(s): PCO2 CULTURES Recent Results (from the past 240 hour(s))  MRSA PCR Screening     Status: None   Collection Time: 07/21/16 12:42 AM  Result Value Ref Range Status   MRSA by PCR NEGATIVE NEGATIVE Final    Comment:        The GeneXpert MRSA Assay (FDA approved for NASAL specimens only), is one component of a comprehensive MRSA colonization surveillance program. It is not intended to diagnose MRSA infection nor to guide or monitor treatment for MRSA infections.    Studies/Results: No results found. Micro Results: Recent Results (from the past 240 hour(s))  MRSA PCR Screening     Status: None   Collection Time: 07/21/16 12:42 AM  Result Value Ref Range Status   MRSA by PCR NEGATIVE NEGATIVE Final    Comment:        The GeneXpert MRSA Assay (FDA approved for NASAL specimens only), is one component of a comprehensive MRSA colonization surveillance program. It is not intended to diagnose MRSA infection nor to guide or monitor treatment for MRSA infections.    Studies/Results: No results found. Medications:  I  have reviewed the patient's current medications Scheduled Meds: . amLODipine  2.5 mg Oral Daily  . atorvastatin  40 mg Oral Daily  . clopidogrel  75 mg Oral Daily  . colchicine  0.6 mg Oral BID  . hydrALAZINE  100 mg Oral TID  . insulin aspart  0-15 Units Subcutaneous TID WC  . insulin aspart  0-5 Units Subcutaneous QHS  . insulin detemir  20 Units Subcutaneous QHS  . isosorbide mononitrate  60 mg Oral Daily  . latanoprost  1 drop Both Eyes QHS  . pantoprazole  40 mg Oral Q0600  . sodium chloride flush  3 mL Intravenous Q12H  . warfarin  1 mg Oral Once  . Warfarin - Pharmacist Dosing Inpatient   Does not apply q1800   Continuous Infusions: PRN Meds:.acetaminophen   Assessment/Plan: #1. Diabetes. Glucose 144 by Accu-Chek this morning. Continue Levemir and NovoLog. #2. Chronic kidney disease stage IV. BUN and creatinine are now 52 and 1.67 which are consistent with her levels last November. #3. Right foot pain. X-ray right foot today. Question gout. Colchicine twice daily for 3 days for now. #4. Chronic atrial fibrillation. She has continued to show bradycardia on telemetry and metoprolol has been held. We will discontinue it for now. INR now 2.56. Principal Problem:   Type 2 diabetes mellitus with hyperosmolar nonketotic hyperglycemia (HCC) Active Problems:   Long term current use of anticoagulant   CHF (congestive heart failure) (HCC)   Pulmonary hypertension (HCC)   Volume depletion   Myoclonic jerking   Hyperosmolarity syndrome   Hand fracture, right     LOS: 4 days   Daris Aristizabal 07/24/2016, 9:55 AM

## 2016-07-24 NOTE — Progress Notes (Signed)
Order was received to remove foley catheter per Dr. Ouida SillsFagan. Upon speaking with family, RN was made aware that patient was admitted with foley catheter and is to see Dr. Lynnae Sandhoffahlsted in his office on Tuesday to possibly have catheter removed at that time. Dr. Ouida SillsFagan paged and made aware. MD advised RN to leave foley in. Will continue to monitor patient.

## 2016-07-25 DIAGNOSIS — S6291XA Unspecified fracture of right wrist and hand, initial encounter for closed fracture: Secondary | ICD-10-CM

## 2016-07-25 LAB — GLUCOSE, CAPILLARY
Glucose-Capillary: 154 mg/dL — ABNORMAL HIGH (ref 65–99)
Glucose-Capillary: 149 mg/dL — ABNORMAL HIGH (ref 65–99)
Glucose-Capillary: 266 mg/dL — ABNORMAL HIGH (ref 65–99)
Glucose-Capillary: 288 mg/dL — ABNORMAL HIGH (ref 65–99)

## 2016-07-25 LAB — CBC
HCT: 29.5 % — ABNORMAL LOW (ref 36.0–46.0)
HEMOGLOBIN: 9.8 g/dL — AB (ref 12.0–15.0)
MCH: 31.4 pg (ref 26.0–34.0)
MCHC: 33.2 g/dL (ref 30.0–36.0)
MCV: 94.6 fL (ref 78.0–100.0)
Platelets: 175 10*3/uL (ref 150–400)
RBC: 3.12 MIL/uL — ABNORMAL LOW (ref 3.87–5.11)
RDW: 13.4 % (ref 11.5–15.5)
WBC: 7.7 10*3/uL (ref 4.0–10.5)

## 2016-07-25 LAB — URIC ACID: URIC ACID, SERUM: 9.1 mg/dL — AB (ref 2.3–6.6)

## 2016-07-25 LAB — PROTIME-INR
INR: 1.57
PROTHROMBIN TIME: 19 s — AB (ref 11.4–15.2)

## 2016-07-25 MED ORDER — POLYETHYLENE GLYCOL 3350 17 G PO PACK: 17.0000 g | PACK | Freq: Every day | ORAL | Status: DC

## 2016-07-25 MED ORDER — WARFARIN SODIUM 2.5 MG PO TABS
2.5000 mg | ORAL_TABLET | Freq: Once | ORAL | Status: AC
  Administered 2016-07-25: 2.5 mg via ORAL
  Filled 2016-07-25: qty 1

## 2016-07-25 MED ORDER — BISACODYL 10 MG RE SUPP
10.0000 mg | Freq: Every day | RECTAL | Status: DC | PRN
Start: 2016-07-25 — End: 2016-07-26

## 2016-07-25 NOTE — Progress Notes (Signed)
Subjective: She says she feels okay. Her foot may be a little bit better. She still complains of pain. She did not show a fracture in the x-ray which I personally reviewed. She did not have uric acid level done so I will do that. She says it's very painful to try to walk.  Objective: Vital signs in last 24 hours: Temp:  [98.2 F (36.8 C)-99 F (37.2 C)] 98.2 F (36.8 C) (06/18 0548) Pulse Rate:  [44-68] 57 (06/18 0548) Resp:  [16-18] 18 (06/18 0548) BP: (128-132)/(55-83) 129/83 (06/18 0548) SpO2:  [100 %] 100 % (06/18 0548) Weight change:  Last BM Date: 07/24/16  Intake/Output from previous day: 06/17 0701 - 06/18 0700 In: 483 [P.O.:480; I.V.:3] Out: 2600 [Urine:2600]  PHYSICAL EXAM General appearance: alert, cooperative, mild distress and Very hard of hearing Resp: clear to auscultation bilaterally Cardio: irregularly irregular rhythm GI: soft, non-tender; bowel sounds normal; no masses,  no organomegaly Extremities: extremities normal, atraumatic, no cyanosis or edema She is mildly tender on the upper arch of her right foot  Lab Results:  Results for orders placed or performed during the hospital encounter of 07/20/16 (from the past 48 hour(s))  Glucose, capillary     Status: Abnormal   Collection Time: 07/23/16 11:42 AM  Result Value Ref Range   Glucose-Capillary 226 (H) 65 - 99 mg/dL  Glucose, capillary     Status: Abnormal   Collection Time: 07/23/16  4:11 PM  Result Value Ref Range   Glucose-Capillary 250 (H) 65 - 99 mg/dL   Comment 1 Notify RN   Glucose, capillary     Status: Abnormal   Collection Time: 07/23/16  9:52 PM  Result Value Ref Range   Glucose-Capillary 322 (H) 65 - 99 mg/dL   Comment 1 Notify RN    Comment 2 Document in Chart   Glucose, capillary     Status: Abnormal   Collection Time: 07/24/16  3:25 AM  Result Value Ref Range   Glucose-Capillary 297 (H) 65 - 99 mg/dL   Comment 1 Notify RN    Comment 2 Document in Chart   Protime-INR     Status:  Abnormal   Collection Time: 07/24/16  6:31 AM  Result Value Ref Range   Prothrombin Time 28.0 (H) 11.4 - 15.2 seconds   INR 8.45   Basic metabolic panel     Status: Abnormal   Collection Time: 07/24/16  6:31 AM  Result Value Ref Range   Sodium 134 (L) 135 - 145 mmol/L   Potassium 3.9 3.5 - 5.1 mmol/L   Chloride 104 101 - 111 mmol/L   CO2 25 22 - 32 mmol/L   Glucose, Bld 190 (H) 65 - 99 mg/dL   BUN 52 (H) 6 - 20 mg/dL   Creatinine, Ser 1.67 (H) 0.44 - 1.00 mg/dL   Calcium 8.5 (L) 8.9 - 10.3 mg/dL   GFR calc non Af Amer 27 (L) >60 mL/min   GFR calc Af Amer 32 (L) >60 mL/min    Comment: (NOTE) The eGFR has been calculated using the CKD EPI equation. This calculation has not been validated in all clinical situations. eGFR's persistently <60 mL/min signify possible Chronic Kidney Disease.    Anion gap 5 5 - 15  Glucose, capillary     Status: Abnormal   Collection Time: 07/24/16  7:37 AM  Result Value Ref Range   Glucose-Capillary 144 (H) 65 - 99 mg/dL   Comment 1 Notify RN   Glucose, capillary  Status: Abnormal   Collection Time: 07/24/16 11:05 AM  Result Value Ref Range   Glucose-Capillary 149 (H) 65 - 99 mg/dL   Comment 1 Notify RN   Glucose, capillary     Status: Abnormal   Collection Time: 07/24/16  4:27 PM  Result Value Ref Range   Glucose-Capillary 167 (H) 65 - 99 mg/dL   Comment 1 Notify RN   Glucose, capillary     Status: Abnormal   Collection Time: 07/24/16  9:48 PM  Result Value Ref Range   Glucose-Capillary 208 (H) 65 - 99 mg/dL  Protime-INR     Status: Abnormal   Collection Time: 07/25/16  5:12 AM  Result Value Ref Range   Prothrombin Time 19.0 (H) 11.4 - 15.2 seconds   INR 1.57   CBC     Status: Abnormal   Collection Time: 07/25/16  5:12 AM  Result Value Ref Range   WBC 7.7 4.0 - 10.5 K/uL   RBC 3.12 (L) 3.87 - 5.11 MIL/uL   Hemoglobin 9.8 (L) 12.0 - 15.0 g/dL   HCT 29.5 (L) 36.0 - 46.0 %   MCV 94.6 78.0 - 100.0 fL   MCH 31.4 26.0 - 34.0 pg   MCHC  33.2 30.0 - 36.0 g/dL   RDW 13.4 11.5 - 15.5 %   Platelets 175 150 - 400 K/uL  Glucose, capillary     Status: Abnormal   Collection Time: 07/25/16  7:32 AM  Result Value Ref Range   Glucose-Capillary 154 (H) 65 - 99 mg/dL    ABGS No results for input(s): PHART, PO2ART, TCO2, HCO3 in the last 72 hours.  Invalid input(s): PCO2 CULTURES Recent Results (from the past 240 hour(s))  MRSA PCR Screening     Status: None   Collection Time: 07/21/16 12:42 AM  Result Value Ref Range Status   MRSA by PCR NEGATIVE NEGATIVE Final    Comment:        The GeneXpert MRSA Assay (FDA approved for NASAL specimens only), is one component of a comprehensive MRSA colonization surveillance program. It is not intended to diagnose MRSA infection nor to guide or monitor treatment for MRSA infections.    Studies/Results: Dg Foot 2 Views Right  Result Date: 07/24/2016 CLINICAL DATA:  Pt c/o right foot pain overnight. Pt had fallen at home prior to her admission but had not had pain in the foot previously. She denies any history of gout. EXAM: RIGHT FOOT - 2 VIEW COMPARISON:  None. FINDINGS: Bones appear osteopenic. There is no acute fracture or subluxation. No radiopaque foreign body or soft tissue gas. A plantar calcaneal spur is present. Small artery calcifications are present. IMPRESSION: No evidence for acute  abnormality. Electronically Signed   By: Nolon Nations M.D.   On: 07/24/2016 14:55    Medications:  Prior to Admission:  Prescriptions Prior to Admission  Medication Sig Dispense Refill Last Dose  . acetaminophen (TYLENOL) 325 MG tablet Take 2 tablets (650 mg total) by mouth every 4 (four) hours as needed for mild pain, moderate pain, fever or headache.   unknown  . ALPRAZolam (XANAX) 0.25 MG tablet Take 0.25 mg by mouth 3 (three) times daily as needed.   07/20/2016 at Unknown time  . amLODipine (NORVASC) 2.5 MG tablet TAKE (1) TABLET BY MOUTH DAILY. 30 tablet 6 07/20/2016 at Unknown time  .  atorvastatin (LIPITOR) 40 MG tablet Take 1 tablet (40 mg total) by mouth daily. 90 tablet 3 07/20/2016 at Unknown time  . clopidogrel (  PLAVIX) 75 MG tablet Take 1 tablet (75 mg total) by mouth daily. 90 tablet 3 07/20/2016 at 0800  . glimepiride (AMARYL) 2 MG tablet Take 2 mg by mouth daily.     07/20/2016 at Unknown time  . hydrALAZINE (APRESOLINE) 100 MG tablet Take 1 tablet (100 mg total) by mouth 3 (three) times daily. 270 tablet 3 07/20/2016 at Unknown time  . HYDROcodone-acetaminophen (NORCO/VICODIN) 5-325 MG tablet Take 1 tablet by mouth 4 (four) times daily.    07/20/2016 at Unknown time  . isosorbide mononitrate (IMDUR) 60 MG 24 hr tablet Take 1 tablet (60 mg total) by mouth daily. 90 tablet 3 07/20/2016 at Unknown time  . metoprolol tartrate (LOPRESSOR) 25 MG tablet Take 0.5 tablets (12.5 mg total) by mouth 2 (two) times daily. 90 tablet 3 07/20/2016 at 0800  . nitroGLYCERIN (NITROSTAT) 0.4 MG SL tablet Place 1 tablet (0.4 mg total) under the tongue every 5 (five) minutes as needed for chest pain. 25 tablet 2 unknown  . pantoprazole (PROTONIX) 40 MG tablet Take 1 tablet (40 mg total) by mouth daily at 6 (six) AM. 90 tablet 3 07/20/2016 at Unknown time  . torsemide (DEMADEX) 20 MG tablet Take 1 tablet (20 mg total) by mouth 2 (two) times daily. 60 tablet 12 07/20/2016 at Unknown time  . TRAVATAN Z 0.004 % SOLN ophthalmic solution Place 1 drop into both eyes at bedtime.    07/20/2016 at Unknown time  . warfarin (COUMADIN) 5 MG tablet TAKE 1 TABLET BY MOUTH DAILY EXCEPT 1/2 TABLET ON WEDNESDAYS AND SATURDAYS. 30 tablet 6 07/19/2016 at 2030   Scheduled: . amLODipine  2.5 mg Oral Daily  . atorvastatin  40 mg Oral Daily  . clopidogrel  75 mg Oral Daily  . colchicine  0.6 mg Oral BID  . hydrALAZINE  100 mg Oral TID  . insulin aspart  0-15 Units Subcutaneous TID WC  . insulin aspart  0-5 Units Subcutaneous QHS  . insulin detemir  20 Units Subcutaneous QHS  . isosorbide mononitrate  60 mg Oral Daily  .  latanoprost  1 drop Both Eyes QHS  . pantoprazole  40 mg Oral Q0600  . sodium chloride flush  3 mL Intravenous Q12H  . Warfarin - Pharmacist Dosing Inpatient   Does not apply q1800   Continuous:  WUG:[id number]  Assesment: She was admitted with diabetes with hyperosmolar nonketotic state. She is better. She has heart failure at baseline. She has atrial fib at baseline. She has been bradycardic in the hospital and metoprolol has been discontinued. She's having pain in her foot and I don't think we have a definite diagnosis yet. Principal Problem:   Type 2 diabetes mellitus with hyperosmolar nonketotic hyperglycemia (HCC) Active Problems:   Long term current use of anticoagulant   CHF (congestive heart failure) (HCC)   Pulmonary hypertension (HCC)   Volume depletion   Myoclonic jerking   Hyperosmolarity syndrome   Hand fracture, right    Plan: Check uric acid level. I will request a orthopedic consultation. Continue other medications    LOS: 5 days   Myonna Chisom L 07/25/2016, 8:17 AM

## 2016-07-25 NOTE — Consult Note (Signed)
  Consult note  Dr. [name] has requested a consultation for fracture of the right hand  History 83 year old female was admitted to the hospital after falling tripping in the bathroom. She actually complained of multiple joint aches and had x-rays of her hip and upper extremity which showed she has a quite treat for fracture of the right hand  She complains of new onset dull aching constant pain over the dorsum of the right hand with painful wrist flexion and extension  Review of systems she does not have any neurologic symptoms such as numbness or tingling and there is no evidence or complaint of laceration to the skin  Past Medical History:  Diagnosis Date  . Atrial fibrillation (HCC)   . CAD (coronary artery disease)    Multivessel status post high risk PCI/DES to circumflex 11/2015 (poor candidate for CABG)  . CKD (chronic kidney disease) stage 3, GFR 30-59 ml/min   . DDD (degenerative disc disease), lumbar   . Essential hypertension, benign   . Mixed hyperlipidemia   . Type 2 diabetes mellitus (HCC)     BP 120/80 (BP Location: Right Arm)   Pulse (!) 54   Temp 98.3 F (36.8 C) (Oral)   Resp 17   Ht 5\' 6"  (1.676 m)   Wt 137 lb 5.6 oz (62.3 kg)   SpO2 100%   BMI 22.17 kg/m  Her appearance is she has an ectomorphic body habitus very dark skin. She is oriented to person and place and time but she has hard time hearing  Her mood is pleasant her affect is flat  Her gait was not observed  Inspection of the right hand shows tenderness over the dorsum of the hand with painful but full intact wrist range of motion and no instability. Motor exam is normal in terms of grip strength she has an excellent radial pulse normal sensation in the hand and the skin is intact  The left wrist is not swollen or tender  The x-ray of her wrist and hand show a triquetral fracture with arthritis of the right hand  Impression triquetral fracture right hand  Recommend continue with the removable  splint 4 weeks  Set up x-ray in the office in 4 weeks.

## 2016-07-25 NOTE — Progress Notes (Signed)
ANTICOAGULATION CONSULT NOTE   Pharmacy Consult for Coumadin Indication: atrial fibrillation  No Known Allergies  Patient Measurements: Height: 5\' 6"  (167.6 cm) Weight: 137 lb 5.6 oz (62.3 kg) IBW/kg (Calculated) : 59.3  Vital Signs: Temp: 98.2 F (36.8 C) (06/18 0548) Temp Source: Oral (06/18 0548) BP: 129/83 (06/18 0548) Pulse Rate: 57 (06/18 0548)  Labs:  Recent Labs  07/23/16 0438 07/24/16 0631 07/25/16 0512  HGB 10.9*  --  9.8*  HCT 31.9*  --  29.5*  PLT 156  --  175  LABPROT 45.3* 28.0* 19.0*  INR 4.71* 2.56 1.57  CREATININE  --  1.67*  --    Estimated Creatinine Clearance: 23.9 mL/min (A) (by C-G formula based on SCr of 1.67 mg/dL (H)).  Medical History: Past Medical History:  Diagnosis Date  . Atrial fibrillation (HCC)   . CAD (coronary artery disease)    Multivessel status post high risk PCI/DES to circumflex 11/2015 (poor candidate for CABG)  . CKD (chronic kidney disease) stage 3, GFR 30-59 ml/min   . DDD (degenerative disc disease), lumbar   . Essential hypertension, benign   . Mixed hyperlipidemia   . Type 2 diabetes mellitus (HCC)    Medications:  Prescriptions Prior to Admission  Medication Sig Dispense Refill Last Dose  . acetaminophen (TYLENOL) 325 MG tablet Take 2 tablets (650 mg total) by mouth every 4 (four) hours as needed for mild pain, moderate pain, fever or headache.   unknown  . ALPRAZolam (XANAX) 0.25 MG tablet Take 0.25 mg by mouth 3 (three) times daily as needed.   07/20/2016 at Unknown time  . amLODipine (NORVASC) 2.5 MG tablet TAKE (1) TABLET BY MOUTH DAILY. 30 tablet 6 07/20/2016 at Unknown time  . atorvastatin (LIPITOR) 40 MG tablet Take 1 tablet (40 mg total) by mouth daily. 90 tablet 3 07/20/2016 at Unknown time  . clopidogrel (PLAVIX) 75 MG tablet Take 1 tablet (75 mg total) by mouth daily. 90 tablet 3 07/20/2016 at 0800  . glimepiride (AMARYL) 2 MG tablet Take 2 mg by mouth daily.     07/20/2016 at Unknown time  . hydrALAZINE  (APRESOLINE) 100 MG tablet Take 1 tablet (100 mg total) by mouth 3 (three) times daily. 270 tablet 3 07/20/2016 at Unknown time  . HYDROcodone-acetaminophen (NORCO/VICODIN) 5-325 MG tablet Take 1 tablet by mouth 4 (four) times daily.    07/20/2016 at Unknown time  . isosorbide mononitrate (IMDUR) 60 MG 24 hr tablet Take 1 tablet (60 mg total) by mouth daily. 90 tablet 3 07/20/2016 at Unknown time  . metoprolol tartrate (LOPRESSOR) 25 MG tablet Take 0.5 tablets (12.5 mg total) by mouth 2 (two) times daily. 90 tablet 3 07/20/2016 at 0800  . nitroGLYCERIN (NITROSTAT) 0.4 MG SL tablet Place 1 tablet (0.4 mg total) under the tongue every 5 (five) minutes as needed for chest pain. 25 tablet 2 unknown  . pantoprazole (PROTONIX) 40 MG tablet Take 1 tablet (40 mg total) by mouth daily at 6 (six) AM. 90 tablet 3 07/20/2016 at Unknown time  . torsemide (DEMADEX) 20 MG tablet Take 1 tablet (20 mg total) by mouth 2 (two) times daily. 60 tablet 12 07/20/2016 at Unknown time  . TRAVATAN Z 0.004 % SOLN ophthalmic solution Place 1 drop into both eyes at bedtime.    07/20/2016 at Unknown time  . warfarin (COUMADIN) 5 MG tablet TAKE 1 TABLET BY MOUTH DAILY EXCEPT 1/2 TABLET ON WEDNESDAYS AND SATURDAYS. 30 tablet 6 07/19/2016 at 2030   Assessment:  83 yo female with hx of afib on anticoagulation with Coumadin. Known CAD, DM, HTN, HLD, presented to the ER after feeling weak and fell. She is improving clinically. INR is now subtherapeutic after holding Coumadin.  INR continues to trend down, will give Coumadin 2.5mg  today.  No bleeding reported.   Goal of Therapy:  INR 2-3 Monitor platelets by anticoagulation protocol: Yes   Plan:  Coumadin 2.5 mg today x 1 Daily PT-INR Monitor for S/S of bleeding  [name], [facility] 07/25/2016,9:12 AM

## 2016-07-25 NOTE — Progress Notes (Signed)
Received verbal order from Dr. [name] for ducolax suppository daily PRN and a daily order for miralax.

## 2016-07-25 NOTE — Progress Notes (Signed)
Inpatient Diabetes Program Recommendations  AACE/ADA: New Consensus Statement on Inpatient Glycemic Control (2015)  Target Ranges:  Prepandial:   less than 140 mg/dL      Peak postprandial:   less than 180 mg/dL (1-2 hours)      Critically ill patients:  140 - 180 mg/dL   Results for THAI, HEMRICK (MRN [id number]) as of 07/25/2016 14:04  Ref. Range 07/22/2016 05:26  Hemoglobin A1C Latest Ref Range: 4.8 - 5.6 % >15.5 (H)   Results for BETRICE, WANAT (MRN [id number]) as of 07/25/2016 14:04  Ref. Range 07/25/2016 07:32 07/25/2016 11:08  Glucose-Capillary Latest Ref Range: 65 - 99 mg/dL 154 (H) 149 (H)    Home DM Meds: Amaryl 2 mg daily  Current Insulin Orders: Levemir 20 units QHS        Novolog Moderate Correction Scale/ SSI (0-15 units) TID AC + HS     MD- Spoke with pt's granddaughter this AM by phone (DM Coordinator not physically present on [facility] today- Patient is hard of hearing and per nurse, family involved in pt's care).  Pt's granddaughter told me that pt has not been checking her CBGs regularly at home the last few months after her son died.  Has been neglecting her care and not taking meds.  Granddaughter told me that she plans to help with patient's care after d/c.  Granddaughter also told me pt used to take insulin several years ago but was able to stop the insulin due to improved glucose control.  Spoke with patient's granddaughter about his/her current A1c of >15.5%.  Explained what an A1c is and what it measures.  Reminded patient's granddaughter that pt's goal A1c is 7-8% or less per ADA standards to prevent both acute and long-term complications.  Explained to patient's granddaughter the extreme importance of good glucose control at home.  Encouraged patient's granddaughter to check pt's CBGs at least TID at home and to record all CBGs in a logbook for PCP or Endocrinologist to review.  Discussed with granddaughter that pt may require insulin for home.  Alerted  granddaughter that nurses will begin insulin teaching with pt and family just in case decision made to send pt home on insulin by MD.  Living Well with Diabetes book and insulin teaching kit ordered for pt and currently at bedside per granddaughter.  RN aware of teaching materials and plans to begin insulin education asap with pt and family.    MD- CBGs well controlled on current regimen.  Perhaps we could discharge pt home on Levemir QHS and her home dose of Amaryl?     --Will follow patient during hospitalization--  Wyn Quaker RN, MSN, CDE Diabetes Coordinator Inpatient Glycemic Control Team Team Pager: [phone] (8a-5p)

## 2016-07-26 ENCOUNTER — Ambulatory Visit (INDEPENDENT_AMBULATORY_CARE_PROVIDER_SITE_OTHER): Payer: Medicare Other | Admitting: Urology

## 2016-07-26 DIAGNOSIS — R3914 Feeling of incomplete bladder emptying: Secondary | ICD-10-CM | POA: Diagnosis not present

## 2016-07-26 LAB — CBC
HEMATOCRIT: 28.8 % — AB (ref 36.0–46.0)
HEMOGLOBIN: 9.9 g/dL — AB (ref 12.0–15.0)
MCH: 31.9 pg (ref 26.0–34.0)
MCHC: 34.4 g/dL (ref 30.0–36.0)
MCV: 92.9 fL (ref 78.0–100.0)
Platelets: 216 10*3/uL (ref 150–400)
RBC: 3.1 MIL/uL — AB (ref 3.87–5.11)
RDW: 13.7 % (ref 11.5–15.5)
WBC: 6.8 10*3/uL (ref 4.0–10.5)

## 2016-07-26 LAB — PROTIME-INR
INR: 1.41
PROTHROMBIN TIME: 17.4 s — AB (ref 11.4–15.2)

## 2016-07-26 LAB — GLUCOSE, CAPILLARY: GLUCOSE-CAPILLARY: 92 mg/dL (ref 65–99)

## 2016-07-26 MED ORDER — INSULIN DETEMIR 100 UNIT/ML ~~LOC~~ SOLN: 20.0000 [IU] | Freq: Every day | SUBCUTANEOUS | 11 refills | Status: DC

## 2016-07-26 MED ORDER — WARFARIN SODIUM 5 MG PO TABS: 5.0000 mg | ORAL_TABLET | Freq: Once | ORAL | Status: DC

## 2016-07-26 MED ORDER — INSULIN LISPRO 100 UNIT/ML ~~LOC~~ SOLN: 0.0000 [IU] | Freq: Three times a day (TID) | SUBCUTANEOUS | 11 refills | Status: DC

## 2016-07-26 NOTE — Progress Notes (Signed)
Subjective: She says she feels better. She has no new complaints. Her breathing is doing okay. Her blood sugar is better. Her foot feels better.  Objective: Vital signs in last 24 hours: Temp:  [98 F (36.7 C)-98.6 F (37 C)] 98 F (36.7 C) (06/19 0557) Pulse Rate:  [54-63] 56 (06/19 0557) Resp:  [17-18] 18 (06/19 0557) BP: (120-126)/(53-80) 126/58 (06/19 0557) SpO2:  [97 %-100 %] 100 % (06/19 0557) Weight change:  Last BM Date: 07/25/16  Intake/Output from previous day: 06/18 0701 - 06/19 0700 In: 723 [P.O.:720; I.V.:3] Out: 900 [Urine:900]  PHYSICAL EXAM General appearance: alert, cooperative and no distress Resp: clear to auscultation bilaterally Cardio: irregularly irregular rhythm GI: soft, non-tender; bowel sounds normal; no masses,  no organomegaly Extremities: extremities normal, atraumatic, no cyanosis or edema She is very hard of hearing making communication difficult  Lab Results:  Results for orders placed or performed during the hospital encounter of 07/20/16 (from the past 48 hour(s))  Glucose, capillary     Status: Abnormal   Collection Time: 07/24/16 11:05 AM  Result Value Ref Range   Glucose-Capillary 149 (H) 65 - 99 mg/dL   Comment 1 Notify RN   Glucose, capillary     Status: Abnormal   Collection Time: 07/24/16  4:27 PM  Result Value Ref Range   Glucose-Capillary 167 (H) 65 - 99 mg/dL   Comment 1 Notify RN   Glucose, capillary     Status: Abnormal   Collection Time: 07/24/16  9:48 PM  Result Value Ref Range   Glucose-Capillary 208 (H) 65 - 99 mg/dL  Protime-INR     Status: Abnormal   Collection Time: 07/25/16  5:12 AM  Result Value Ref Range   Prothrombin Time 19.0 (H) 11.4 - 15.2 seconds   INR 1.57   CBC     Status: Abnormal   Collection Time: 07/25/16  5:12 AM  Result Value Ref Range   WBC 7.7 4.0 - 10.5 K/uL   RBC 3.12 (L) 3.87 - 5.11 MIL/uL   Hemoglobin 9.8 (L) 12.0 - 15.0 g/dL   HCT 16.1 (L) 09.6 - 04.5 %   MCV 94.6 78.0 - 100.0 fL   MCH 31.4 26.0 - 34.0 pg   MCHC 33.2 30.0 - 36.0 g/dL   RDW 40.9 81.1 - 91.4 %   Platelets 175 150 - 400 K/uL  Uric acid     Status: Abnormal   Collection Time: 07/25/16  5:12 AM  Result Value Ref Range   Uric Acid, Serum 9.1 (H) 2.3 - 6.6 mg/dL  Glucose, capillary     Status: Abnormal   Collection Time: 07/25/16  7:32 AM  Result Value Ref Range   Glucose-Capillary 154 (H) 65 - 99 mg/dL  Glucose, capillary     Status: Abnormal   Collection Time: 07/25/16 11:08 AM  Result Value Ref Range   Glucose-Capillary 149 (H) 65 - 99 mg/dL  Glucose, capillary     Status: Abnormal   Collection Time: 07/25/16  4:25 PM  Result Value Ref Range   Glucose-Capillary 266 (H) 65 - 99 mg/dL  Glucose, capillary     Status: Abnormal   Collection Time: 07/25/16  9:01 PM  Result Value Ref Range   Glucose-Capillary 288 (H) 65 - 99 mg/dL   Comment 1 Notify RN    Comment 2 Document in Chart   Protime-INR     Status: Abnormal   Collection Time: 07/26/16  5:56 AM  Result Value Ref Range   Prothrombin  Time 17.4 (H) 11.4 - 15.2 seconds   INR 1.41   CBC     Status: Abnormal   Collection Time: 07/26/16  5:56 AM  Result Value Ref Range   WBC 6.8 4.0 - 10.5 K/uL   RBC 3.10 (L) 3.87 - 5.11 MIL/uL   Hemoglobin 9.9 (L) 12.0 - 15.0 g/dL   HCT 16.128.8 (L) 09.636.0 - 04.546.0 %   MCV 92.9 78.0 - 100.0 fL   MCH 31.9 26.0 - 34.0 pg   MCHC 34.4 30.0 - 36.0 g/dL   RDW 40.913.7 81.111.5 - 91.415.5 %   Platelets 216 150 - 400 K/uL  Glucose, capillary     Status: None   Collection Time: 07/26/16  7:27 AM  Result Value Ref Range   Glucose-Capillary 92 65 - 99 mg/dL   Comment 1 Notify RN    Comment 2 Document in Chart     ABGS No results for input(s): PHART, PO2ART, TCO2, HCO3 in the last 72 hours.  Invalid input(s): PCO2 CULTURES Recent Results (from the past 240 hour(s))  MRSA PCR Screening     Status: None   Collection Time: 07/21/16 12:42 AM  Result Value Ref Range Status   MRSA by PCR NEGATIVE NEGATIVE Final    Comment:         The GeneXpert MRSA Assay (FDA approved for NASAL specimens only), is one component of a comprehensive MRSA colonization surveillance program. It is not intended to diagnose MRSA infection nor to guide or monitor treatment for MRSA infections.    Studies/Results: Dg Foot 2 Views Right  Result Date: 07/24/2016 CLINICAL DATA:  Pt c/o right foot pain overnight. Pt had fallen at home prior to her admission but had not had pain in the foot previously. She denies any history of gout. EXAM: RIGHT FOOT - 2 VIEW COMPARISON:  None. FINDINGS: Bones appear osteopenic. There is no acute fracture or subluxation. No radiopaque foreign body or soft tissue gas. A plantar calcaneal spur is present. Small artery calcifications are present. IMPRESSION: No evidence for acute  abnormality. Electronically Signed   By: Norva PavlovElizabeth  [name] M.D.   On: 07/24/2016 14:55    Medications:  Prior to Admission:  Prescriptions Prior to Admission  Medication Sig Dispense Refill Last Dose  . acetaminophen (TYLENOL) 325 MG tablet Take 2 tablets (650 mg total) by mouth every 4 (four) hours as needed for mild pain, moderate pain, fever or headache.   unknown  . ALPRAZolam (XANAX) 0.25 MG tablet Take 0.25 mg by mouth 3 (three) times daily as needed.   07/20/2016 at Unknown time  . amLODipine (NORVASC) 2.5 MG tablet TAKE (1) TABLET BY MOUTH DAILY. 30 tablet 6 07/20/2016 at Unknown time  . atorvastatin (LIPITOR) 40 MG tablet Take 1 tablet (40 mg total) by mouth daily. 90 tablet 3 07/20/2016 at Unknown time  . clopidogrel (PLAVIX) 75 MG tablet Take 1 tablet (75 mg total) by mouth daily. 90 tablet 3 07/20/2016 at 0800  . glimepiride (AMARYL) 2 MG tablet Take 2 mg by mouth daily.     07/20/2016 at Unknown time  . hydrALAZINE (APRESOLINE) 100 MG tablet Take 1 tablet (100 mg total) by mouth 3 (three) times daily. 270 tablet 3 07/20/2016 at Unknown time  . HYDROcodone-acetaminophen (NORCO/VICODIN) 5-325 MG tablet Take 1 tablet by mouth 4  (four) times daily.    07/20/2016 at Unknown time  . isosorbide mononitrate (IMDUR) 60 MG 24 hr tablet Take 1 tablet (60 mg total) by mouth daily. 90  tablet 3 07/20/2016 at Unknown time  . metoprolol tartrate (LOPRESSOR) 25 MG tablet Take 0.5 tablets (12.5 mg total) by mouth 2 (two) times daily. 90 tablet 3 07/20/2016 at 0800  . nitroGLYCERIN (NITROSTAT) 0.4 MG SL tablet Place 1 tablet (0.4 mg total) under the tongue every 5 (five) minutes as needed for chest pain. 25 tablet 2 unknown  . pantoprazole (PROTONIX) 40 MG tablet Take 1 tablet (40 mg total) by mouth daily at 6 (six) AM. 90 tablet 3 07/20/2016 at Unknown time  . torsemide (DEMADEX) 20 MG tablet Take 1 tablet (20 mg total) by mouth 2 (two) times daily. 60 tablet 12 07/20/2016 at Unknown time  . TRAVATAN Z 0.004 % SOLN ophthalmic solution Place 1 drop into both eyes at bedtime.    07/20/2016 at Unknown time  . warfarin (COUMADIN) 5 MG tablet TAKE 1 TABLET BY MOUTH DAILY EXCEPT 1/2 TABLET ON WEDNESDAYS AND SATURDAYS. 30 tablet 6 07/19/2016 at 2030   Scheduled: . amLODipine  2.5 mg Oral Daily  . atorvastatin  40 mg Oral Daily  . clopidogrel  75 mg Oral Daily  . colchicine  0.6 mg Oral BID  . hydrALAZINE  100 mg Oral TID  . insulin aspart  0-15 Units Subcutaneous TID WC  . insulin aspart  0-5 Units Subcutaneous QHS  . insulin detemir  20 Units Subcutaneous QHS  . isosorbide mononitrate  60 mg Oral Daily  . latanoprost  1 drop Both Eyes QHS  . pantoprazole  40 mg Oral Q0600  . polyethylene glycol  17 g Oral Daily  . sodium chloride flush  3 mL Intravenous Q12H  . warfarin  5 mg Oral Once  . Warfarin - Pharmacist Dosing Inpatient   Does not apply q1800   Continuous:  ZOX:[id number], bisacodyl  Assesment: She was admitted with diabetes with hyperosmolar nonketotic hyperglycemic state. She is much better. She had a fall and has a fracture of her hand on the right. She was dehydrated. At baseline she has congestive heart failure and that's  pretty stable. At baseline she has atrial fib which is stable. She has pulmonary hypertension. She is very hard of hearing. I think she's ready for discharge now. Principal Problem:   Type 2 diabetes mellitus with hyperosmolar nonketotic hyperglycemia (HCC) Active Problems:   Long term current use of anticoagulant   CHF (congestive heart failure) (HCC)   Pulmonary hypertension (HCC)   Volume depletion   Myoclonic jerking   Hyperosmolarity syndrome   Hand fracture, right    Plan: Discharge home today    LOS: 6 days   Melissa [name] L 07/26/2016, 8:47 AM

## 2016-07-26 NOTE — Discharge Summary (Addendum)
Physician Discharge Summary  Patient ID: Melissa [name] MRN: [id number] DOB/AGE: [id number] 81 y.o. Primary Care Physician:Emnet Monk, Ramon DredgeEdward, MD Admit date: 07/20/2016 Discharge date: 07/26/2016    Discharge Diagnoses:   Principal Problem:   Type 2 diabetes mellitus with hyperosmolar nonketotic hyperglycemia (HCC) Active Problems:   Essential hypertension, benign   Persistent atrial fibrillation (HCC)   Long term current use of anticoagulant   CHF (congestive heart failure) (HCC)   Chronic kidney disease (CKD), stage IV (severe) (HCC)   Pulmonary hypertension (HCC)   Bradycardia   Anemia of chronic disease   Volume depletion   Myoclonic jerking   Hyperosmolarity syndrome   Hand fracture, right Urinary retention Acute on chronic diastolic heart failure due to ischemic heart disease Allergies as of 07/26/2016   No Known Allergies     Medication List    STOP taking these medications   metoprolol tartrate 25 MG tablet Commonly known as:  LOPRESSOR     TAKE these medications   acetaminophen 325 MG tablet Commonly known as:  TYLENOL Take 2 tablets (650 mg total) by mouth every 4 (four) hours as needed for mild pain, moderate pain, fever or headache.   ALPRAZolam 0.25 MG tablet Commonly known as:  XANAX Take 0.25 mg by mouth 3 (three) times daily as needed.   amLODipine 2.5 MG tablet Commonly known as:  NORVASC TAKE (1) TABLET BY MOUTH DAILY.   atorvastatin 40 MG tablet Commonly known as:  LIPITOR Take 1 tablet (40 mg total) by mouth daily.   clopidogrel 75 MG tablet Commonly known as:  PLAVIX Take 1 tablet (75 mg total) by mouth daily.   glimepiride 2 MG tablet Commonly known as:  AMARYL Take 2 mg by mouth daily.   hydrALAZINE 100 MG tablet Commonly known as:  APRESOLINE Take 1 tablet (100 mg total) by mouth 3 (three) times daily.   HYDROcodone-acetaminophen 5-325 MG tablet Commonly known as:  NORCO/VICODIN Take 1 tablet by mouth 4 (four) times daily.    insulin detemir 100 UNIT/ML injection Commonly known as:  LEVEMIR Inject 0.2 mLs (20 Units total) into the skin at bedtime.   insulin lispro 100 UNIT/ML injection Commonly known as:  HUMALOG Inject 0-0.15 mLs (0-15 Units total) into the skin 3 (three) times daily with meals. Use as sliding scale   isosorbide mononitrate 60 MG 24 hr tablet Commonly known as:  IMDUR Take 1 tablet (60 mg total) by mouth daily.   nitroGLYCERIN 0.4 MG SL tablet Commonly known as:  NITROSTAT Place 1 tablet (0.4 mg total) under the tongue every 5 (five) minutes as needed for chest pain.   pantoprazole 40 MG tablet Commonly known as:  PROTONIX Take 1 tablet (40 mg total) by mouth daily at 6 (six) AM.   torsemide 20 MG tablet Commonly known as:  DEMADEX Take 1 tablet (20 mg total) by mouth 2 (two) times daily.   TRAVATAN Z 0.004 % Soln ophthalmic solution Generic drug:  Travoprost (BAK Free) Place 1 drop into both eyes at bedtime.   warfarin 5 MG tablet Commonly known as:  COUMADIN TAKE 1 TABLET BY MOUTH DAILY EXCEPT 1/2 TABLET ON WEDNESDAYS AND SATURDAYS.            Durable Medical Equipment        Start     Ordered   07/21/16 1438  For home use only DME Walker  Once    Question:  Patient needs a walker to treat with the following condition  Answer:  Weakness   07/21/16 1438      Discharged Condition:Improved    Consults: None  Significant Diagnostic Studies: Dg Lumbar Spine Complete  Result Date: 07/20/2016 CLINICAL DATA:  Fall with pain EXAM: LUMBAR SPINE - COMPLETE 4+ VIEW COMPARISON:  None. FINDINGS: Large calcified pelvic masses. Aortic atherosclerosis. Limited details on the lateral views. Suspect that there may be grade 1 anterolisthesis of L4 on L5. Vertebral body heights are grossly maintained. Moderate-to-marked degenerative disc changes at L2-L3 and L3-L4. Moderate changes at L5-S1 and L1-L2. IMPRESSION: Degenerative changes.  No definite acute osseous abnormality.  Electronically Signed   By: Jasmine Pang M.D.   On: 07/20/2016 18:59   Ct Head Wo Contrast  Result Date: 07/20/2016 CLINICAL DATA:  Spastic movement of right arm, history of a fall EXAM: CT HEAD WITHOUT CONTRAST TECHNIQUE: Contiguous axial images were obtained from the base of the skull through the vertex without intravenous contrast. COMPARISON:  None. FINDINGS: Brain: Motion artifact limits the exam. No acute territorial infarction, hemorrhage or intracranial mass is seen. Mild to moderate periventricular and deep white matter small vessel ischemic changes. Mild atrophy. Ventricles within normal limits. Vascular: No hyperdense vessels.  Carotid artery calcifications. Skull: No fracture or suspicious bone lesion Sinuses/Orbits: No acute finding. Other: None IMPRESSION: The examination is slightly degraded by patient motion. No acute intracranial abnormality. Atrophy and small vessel ischemic changes of the white matter. Electronically Signed   By: Jasmine Pang M.D.   On: 07/20/2016 22:35   Ct Hip Right Wo Contrast  Result Date: 07/20/2016 CLINICAL DATA:  Right hip pain.  Fall today. EXAM: CT OF THE RIGHT HIP WITHOUT CONTRAST TECHNIQUE: Multidetector CT imaging of the right hip was performed according to the standard protocol. Multiplanar CT image reconstructions were also generated. COMPARISON:  12/19/2002 and 07/20/2016 radiographs FINDINGS: Despite efforts by the technologist and patient, motion artifact is present on today's exam and could not be eliminated. This reduces exam sensitivity and specificity. Bones/Joint/Cartilage Cortical thickening and trabecular thickening in the right iliac bone, acetabulum, and ischium characteristic of Paget' s disease of bone. Mild sclerosis along the right sacral ala adjacent to the sacroiliac joint suggesting mild sacroiliitis. Spurring of the right femoral head and acetabulum. Accounting for the motion artifact as best I can, I do not observe a definite fracture  involving the right proximal femur or regional right hemipelvis. Moderate loss of chondral thickness in the right hip. Faint chondrocalcinosis. Subtle irregularity of the sacrococcygeal junction anteriorly for example on image 14/8, this could be chronic. Ligaments Suboptimally assessed by CT. Muscles and Tendons We partially include a lipoma of the right semi tendinosis muscle. Soft tissues Iliac and femoral artery atherosclerotic calcification. Subcutaneous edema overlies the right hip. There is some confluent edema along the right perineum and upper medial thigh on images 94-111 series 3. Similar findings are suspected on the left, for example image 99/3. Multiple large calcified uterine fibroids are present. IMPRESSION: 1. No fracture identified. Sensitivity for subtle fractures reduced by motion artifact. 2. Paget's disease of the right hemipelvis. 3. Mild right sacroiliitis. 4. Moderate chondral thinning in the right hip with faint chondrocalcinosis potentially reflecting CPPD arthropathy. 5. Subtle irregularity of the sacrococcygeal junction, probably incidental/chronic. 6. Incidental lipoma of the right semitendinosus muscle. 7. Considerable atherosclerosis. 8. There is subcutaneous edema laterally overlying the right hip, and more confluent subcutaneous edema along the perineum bilaterally. 9. Multiple large calcified uterine fibroids. Electronically Signed   By: Gaylyn Rong M.D.   On: 07/20/2016  20:18   Dg Chest Port 1 View  Result Date: 07/20/2016 CLINICAL DATA:  Status post fall, with concern for chest injury. Initial encounter. EXAM: PORTABLE CHEST 1 VIEW COMPARISON:  Chest radiograph performed 11/22/2015, and CT of the chest performed 11/27/2015 FINDINGS: The lungs are well-aerated. Peribronchial thickening is noted. Mild left basilar airspace opacity likely reflects atelectasis. There is no evidence of pleural effusion or pneumothorax. The cardiomediastinal silhouette is mildly enlarged. No  acute osseous abnormalities are seen. IMPRESSION: 1. No displaced rib fracture seen. 2. Peribronchial thickening noted. Mild left basilar airspace opacity likely reflects atelectasis. Electronically Signed   By: Roanna Raider M.D.   On: 07/20/2016 23:32   Dg Hand Complete Right  Result Date: 07/20/2016 CLINICAL DATA:  Fall this morning with right hand pain, initial encounter EXAM: RIGHT HAND - COMPLETE 3+ VIEW COMPARISON:  None. FINDINGS: Some lucency is noted within the triquetrum on the lateral projection. It would be difficult to exclude an undisplaced fracture. No other fracture is seen. Mild degenerative changes in the interphalangeal joints are noted. IMPRESSION: Question triquetrum fracture Electronically Signed   By: Alcide Clever M.D.   On: 07/20/2016 15:28   Dg Foot 2 Views Right  Result Date: 07/24/2016 CLINICAL DATA:  Pt c/o right foot pain overnight. Pt had fallen at home prior to her admission but had not had pain in the foot previously. She denies any history of gout. EXAM: RIGHT FOOT - 2 VIEW COMPARISON:  None. FINDINGS: Bones appear osteopenic. There is no acute fracture or subluxation. No radiopaque foreign body or soft tissue gas. A plantar calcaneal spur is present. Small artery calcifications are present. IMPRESSION: No evidence for acute  abnormality. Electronically Signed   By: Norva Pavlov M.D.   On: 07/24/2016 14:55   Dg Hip Unilat With Pelvis 2-3 Views Right  Result Date: 07/20/2016 CLINICAL DATA:  Fall with pain in the right hip EXAM: DG HIP (WITH OR WITHOUT PELVIS) 2-3V RIGHT COMPARISON:  None. FINDINGS: Status post left hip replacement with normal alignment. Pubic symphysis is intact. Mild to moderate degenerative changes of the right hip. No fracture or dislocation is seen. Large calcified masses in the pelvis, likely fibroids. Vascular calcification. IMPRESSION: 1. No definite acute osseous abnormality. Mild to moderate degenerative changes of the right hip 2. Status  post left hip replacement with normal alignment 3. Large calcified pelvic masses are probably fibroids. Electronically Signed   By: Jasmine Pang M.D.   On: 07/20/2016 18:56    Lab Results: Basic Metabolic Panel:  Recent Labs  16/10/96 0631  NA 134*  K 3.9  CL 104  CO2 25  GLUCOSE 190*  BUN 52*  CREATININE 1.67*  CALCIUM 8.5*   Liver Function Tests: No results for input(s): AST, ALT, ALKPHOS, BILITOT, PROT, ALBUMIN in the last 72 hours.   CBC:  Recent Labs  07/25/16 0512 07/26/16 0556  WBC 7.7 6.8  HGB 9.8* 9.9*  HCT 29.5* 28.8*  MCV 94.6 92.9  PLT 175 216    Recent Results (from the past 240 hour(s))  MRSA PCR Screening     Status: None   Collection Time: 07/21/16 12:42 AM  Result Value Ref Range Status   MRSA by PCR NEGATIVE NEGATIVE Final    Comment:        The GeneXpert MRSA Assay (FDA approved for NASAL specimens only), is one component of a comprehensive MRSA colonization surveillance program. It is not intended to diagnose MRSA infection nor to guide or monitor  treatment for MRSA infections.      Hospital Course: This is a 83 year old who came to the emergency department because she fell. She was found to have a fracture in her right hand. She was also found to have a blood sugar of over 1000. She was started on intravenous insulin given IV fluids and improved. She has a chronic indwelling Foley catheter for the last month or so because of urinary retention. She developed some pain in her foot was treated with colchicine but I'm going to stop that because of her renal insufficiency. She did well on sliding scale insulin and on basal insulin with good control of her blood sugars now. Her daughter will be at home with her and knows how to administer insulin. She will remain on anticoagulation. She became quite bradycardic during her hospitalization and metoprolol has been held but may need to be restarted as an outpatient. This will be reassessed. She will be  discharged home with home health services  Discharge Exam: Blood pressure (!) 126/58, pulse (!) 56, temperature 98 F (36.7 C), temperature source Oral, resp. rate 18, height 5\' 6"  (1.676 m), weight 62.3 kg (137 lb 5.6 oz), SpO2 100 %. She is awake and alert. Very hard of hearing. She has chronic atrial fib. Her chest is clear. Foot is no longer tender. She is wearing a splint on her right hand.  Disposition: Home with home health services  Discharge Instructions    Face-to-face encounter (required for Medicare/Medicaid patients)    Complete by:  As directed    I Chyla Schlender L certify that this patient is under my care and that I, or a nurse practitioner or physician's assistant working with me, had a face-to-face encounter that meets the physician face-to-face encounter requirements with this patient on 07/26/2016. The encounter with the patient was in whole, or in part for the following medical condition(s) which is the primary reason for home health care (List medical condition): hyperosmolar diabetic state   The encounter with the patient was in whole, or in part, for the following medical condition, which is the primary reason for home health care:  hyperosmolar diabetic state   I certify that, based on my findings, the following services are medically necessary home health services:   Nursing Physical therapy     Reason for Medically Necessary Home Health Services:  Skilled Nursing- Change/Decline in Patient Status   My clinical findings support the need for the above services:  Unable to leave home safely without assistance and/or assistive device   Further, I certify that my clinical findings support that this patient is homebound due to:  Unable to leave home safely without assistance   Home Health    Complete by:  As directed    To provide the following care/treatments:   PT RN        Follow-up Information    Vickki Hearing, MD Follow up in 4 week(s).   Specialties:   Orthopedic Surgery, Radiology Why:  July 17th, Tuesday, @ 2:40 PM  Contact information: [address] [place] [address] [phone]           Signed: Fredirick Maudlin   07/26/2016, 8:54 AM

## 2016-07-26 NOTE — Progress Notes (Signed)
ANTICOAGULATION CONSULT NOTE   Pharmacy Consult for Coumadin Indication: atrial fibrillation  No Known Allergies  Patient Measurements: Height: 5\' 6"  (167.6 cm) Weight: 137 lb 5.6 oz (62.3 kg) IBW/kg (Calculated) : 59.3  Vital Signs: Temp: 98 F (36.7 C) (06/19 0557) Temp Source: Oral (06/19 0557) BP: 126/58 (06/19 0557) Pulse Rate: 56 (06/19 0557)  Labs:  Recent Labs  07/24/16 0631 07/25/16 0512 07/26/16 0556  HGB  --  9.8* 9.9*  HCT  --  29.5* 28.8*  PLT  --  175 216  LABPROT 28.0* 19.0* 17.4*  INR 2.56 1.57 1.41  CREATININE 1.67*  --   --    Estimated Creatinine Clearance: 23.9 mL/min (A) (by C-G formula based on SCr of 1.67 mg/dL (H)).  Medical History: Past Medical History:  Diagnosis Date  . Atrial fibrillation (HCC)   . CAD (coronary artery disease)    Multivessel status post high risk PCI/DES to circumflex 11/2015 (poor candidate for CABG)  . CKD (chronic kidney disease) stage 3, GFR 30-59 ml/min   . DDD (degenerative disc disease), lumbar   . Essential hypertension, benign   . Mixed hyperlipidemia   . Type 2 diabetes mellitus (HCC)    Medications:  Prescriptions Prior to Admission  Medication Sig Dispense Refill Last Dose  . acetaminophen (TYLENOL) 325 MG tablet Take 2 tablets (650 mg total) by mouth every 4 (four) hours as needed for mild pain, moderate pain, fever or headache.   unknown  . ALPRAZolam (XANAX) 0.25 MG tablet Take 0.25 mg by mouth 3 (three) times daily as needed.   07/20/2016 at Unknown time  . amLODipine (NORVASC) 2.5 MG tablet TAKE (1) TABLET BY MOUTH DAILY. 30 tablet 6 07/20/2016 at Unknown time  . atorvastatin (LIPITOR) 40 MG tablet Take 1 tablet (40 mg total) by mouth daily. 90 tablet 3 07/20/2016 at Unknown time  . clopidogrel (PLAVIX) 75 MG tablet Take 1 tablet (75 mg total) by mouth daily. 90 tablet 3 07/20/2016 at 0800  . glimepiride (AMARYL) 2 MG tablet Take 2 mg by mouth daily.     07/20/2016 at Unknown time  . hydrALAZINE  (APRESOLINE) 100 MG tablet Take 1 tablet (100 mg total) by mouth 3 (three) times daily. 270 tablet 3 07/20/2016 at Unknown time  . HYDROcodone-acetaminophen (NORCO/VICODIN) 5-325 MG tablet Take 1 tablet by mouth 4 (four) times daily.    07/20/2016 at Unknown time  . isosorbide mononitrate (IMDUR) 60 MG 24 hr tablet Take 1 tablet (60 mg total) by mouth daily. 90 tablet 3 07/20/2016 at Unknown time  . metoprolol tartrate (LOPRESSOR) 25 MG tablet Take 0.5 tablets (12.5 mg total) by mouth 2 (two) times daily. 90 tablet 3 07/20/2016 at 0800  . nitroGLYCERIN (NITROSTAT) 0.4 MG SL tablet Place 1 tablet (0.4 mg total) under the tongue every 5 (five) minutes as needed for chest pain. 25 tablet 2 unknown  . pantoprazole (PROTONIX) 40 MG tablet Take 1 tablet (40 mg total) by mouth daily at 6 (six) AM. 90 tablet 3 07/20/2016 at Unknown time  . torsemide (DEMADEX) 20 MG tablet Take 1 tablet (20 mg total) by mouth 2 (two) times daily. 60 tablet 12 07/20/2016 at Unknown time  . TRAVATAN Z 0.004 % SOLN ophthalmic solution Place 1 drop into both eyes at bedtime.    07/20/2016 at Unknown time  . warfarin (COUMADIN) 5 MG tablet TAKE 1 TABLET BY MOUTH DAILY EXCEPT 1/2 TABLET ON WEDNESDAYS AND SATURDAYS. 30 tablet 6 07/19/2016 at 2030   Assessment:  83 yo female with hx of afib on anticoagulation with Coumadin. Known CAD, DM, HTN, HLD, presented to the ER after feeling weak and fell. She is improving clinically. INR is now subtherapeutic after holding Coumadin.  INR continues to trend down, will give Coumadin 5mg  today.  No bleeding reported.   Goal of Therapy:  INR 2-3 Monitor platelets by anticoagulation protocol: Yes   Plan:  Coumadin 5 mg today x 1 Daily PT-INR Monitor for S/S of bleeding  [name], [facility] 07/26/2016,7:48 AM

## 2016-07-26 NOTE — Care Management Note (Signed)
Case Management Note  Patient Details  Name: Melissa [name] MRN: [id number] Date of Birth: November 11, 1930   Expected Discharge Date:  07/26/16               Expected Discharge Plan:  Home w Home Health Services  In-House Referral:  NA  Discharge planning Services  CM Consult  Post Acute Care Choice:  Home Health, Durable Medical Equipment Choice offered to:  Patient, Adult Children  DME Arranged:    DME Agency:  [facility]  HH Arranged[facility]  [facility] Agency:     Status of Service:  Completed.  If discussed at Long Length of Stay Meetings, dates discussed:  07/26/2016  Additional Comments: [facility]. Aware HH has 48hrs to make first visit. Alroy BailiffLinda Lothian, [facility], aware of referral and will obtain pt info from chart. Daughter at bedside to transport home. No further needs communicated.   Malcolm Metrohildress, Ilean Spradlin Demske, RN 07/26/2016, 9:30 AM

## 2016-07-26 NOTE — Care Management Important Message (Signed)
Important Message  Patient Details  Name: Melissa [name] MRN: [id number] Date of Birth: [birth year]   Medicare Important Message Given:  Yes    [name], [name], RN 07/26/2016, 9:30 AM

## 2016-07-28 DIAGNOSIS — I482 Chronic atrial fibrillation: Secondary | ICD-10-CM | POA: Diagnosis not present

## 2016-07-28 DIAGNOSIS — I509 Heart failure, unspecified: Secondary | ICD-10-CM | POA: Diagnosis not present

## 2016-07-28 DIAGNOSIS — I2510 Atherosclerotic heart disease of native coronary artery without angina pectoris: Secondary | ICD-10-CM | POA: Diagnosis not present

## 2016-07-28 DIAGNOSIS — R339 Retention of urine, unspecified: Secondary | ICD-10-CM | POA: Diagnosis not present

## 2016-07-28 DIAGNOSIS — Z79891 Long term (current) use of opiate analgesic: Secondary | ICD-10-CM | POA: Diagnosis not present

## 2016-07-28 DIAGNOSIS — M5136 Other intervertebral disc degeneration, lumbar region: Secondary | ICD-10-CM | POA: Diagnosis not present

## 2016-07-28 DIAGNOSIS — E1100 Type 2 diabetes mellitus with hyperosmolarity without nonketotic hyperglycemic-hyperosmolar coma (NKHHC): Secondary | ICD-10-CM | POA: Diagnosis not present

## 2016-07-28 DIAGNOSIS — I130 Hypertensive heart and chronic kidney disease with heart failure and stage 1 through stage 4 chronic kidney disease, or unspecified chronic kidney disease: Secondary | ICD-10-CM | POA: Diagnosis not present

## 2016-07-28 DIAGNOSIS — N184 Chronic kidney disease, stage 4 (severe): Secondary | ICD-10-CM | POA: Diagnosis not present

## 2016-07-28 DIAGNOSIS — Z96642 Presence of left artificial hip joint: Secondary | ICD-10-CM | POA: Diagnosis not present

## 2016-07-28 DIAGNOSIS — S6291XD Unspecified fracture of right wrist and hand, subsequent encounter for fracture with routine healing: Secondary | ICD-10-CM | POA: Diagnosis not present

## 2016-07-28 DIAGNOSIS — Z7901 Long term (current) use of anticoagulants: Secondary | ICD-10-CM | POA: Diagnosis not present

## 2016-07-28 DIAGNOSIS — E1122 Type 2 diabetes mellitus with diabetic chronic kidney disease: Secondary | ICD-10-CM | POA: Diagnosis not present

## 2016-07-28 DIAGNOSIS — Z7902 Long term (current) use of antithrombotics/antiplatelets: Secondary | ICD-10-CM | POA: Diagnosis not present

## 2016-07-28 DIAGNOSIS — I272 Pulmonary hypertension, unspecified: Secondary | ICD-10-CM | POA: Diagnosis not present

## 2016-07-29 DIAGNOSIS — I272 Pulmonary hypertension, unspecified: Secondary | ICD-10-CM | POA: Diagnosis not present

## 2016-07-29 DIAGNOSIS — I130 Hypertensive heart and chronic kidney disease with heart failure and stage 1 through stage 4 chronic kidney disease, or unspecified chronic kidney disease: Secondary | ICD-10-CM | POA: Diagnosis not present

## 2016-07-29 DIAGNOSIS — I509 Heart failure, unspecified: Secondary | ICD-10-CM | POA: Diagnosis not present

## 2016-07-29 DIAGNOSIS — R339 Retention of urine, unspecified: Secondary | ICD-10-CM | POA: Diagnosis not present

## 2016-07-29 DIAGNOSIS — I2510 Atherosclerotic heart disease of native coronary artery without angina pectoris: Secondary | ICD-10-CM | POA: Diagnosis not present

## 2016-07-29 DIAGNOSIS — N184 Chronic kidney disease, stage 4 (severe): Secondary | ICD-10-CM | POA: Diagnosis not present

## 2016-07-29 DIAGNOSIS — E1100 Type 2 diabetes mellitus with hyperosmolarity without nonketotic hyperglycemic-hyperosmolar coma (NKHHC): Secondary | ICD-10-CM | POA: Diagnosis not present

## 2016-07-29 DIAGNOSIS — M5136 Other intervertebral disc degeneration, lumbar region: Secondary | ICD-10-CM | POA: Diagnosis not present

## 2016-07-29 DIAGNOSIS — Z96642 Presence of left artificial hip joint: Secondary | ICD-10-CM | POA: Diagnosis not present

## 2016-07-29 DIAGNOSIS — I482 Chronic atrial fibrillation: Secondary | ICD-10-CM | POA: Diagnosis not present

## 2016-07-29 DIAGNOSIS — E1122 Type 2 diabetes mellitus with diabetic chronic kidney disease: Secondary | ICD-10-CM | POA: Diagnosis not present

## 2016-07-29 DIAGNOSIS — S6291XD Unspecified fracture of right wrist and hand, subsequent encounter for fracture with routine healing: Secondary | ICD-10-CM | POA: Diagnosis not present

## 2016-07-29 DIAGNOSIS — Z7901 Long term (current) use of anticoagulants: Secondary | ICD-10-CM | POA: Diagnosis not present

## 2016-07-29 DIAGNOSIS — Z79891 Long term (current) use of opiate analgesic: Secondary | ICD-10-CM | POA: Diagnosis not present

## 2016-07-29 DIAGNOSIS — Z7902 Long term (current) use of antithrombotics/antiplatelets: Secondary | ICD-10-CM | POA: Diagnosis not present

## 2016-08-01 ENCOUNTER — Ambulatory Visit (INDEPENDENT_AMBULATORY_CARE_PROVIDER_SITE_OTHER): Payer: Medicare Other | Admitting: *Deleted

## 2016-08-01 DIAGNOSIS — E1129 Type 2 diabetes mellitus with other diabetic kidney complication: Secondary | ICD-10-CM | POA: Diagnosis not present

## 2016-08-01 DIAGNOSIS — I4819 Other persistent atrial fibrillation: Secondary | ICD-10-CM

## 2016-08-01 DIAGNOSIS — Z5181 Encounter for therapeutic drug level monitoring: Secondary | ICD-10-CM | POA: Diagnosis not present

## 2016-08-01 DIAGNOSIS — N3281 Overactive bladder: Secondary | ICD-10-CM | POA: Diagnosis not present

## 2016-08-01 DIAGNOSIS — I4891 Unspecified atrial fibrillation: Secondary | ICD-10-CM | POA: Diagnosis not present

## 2016-08-01 DIAGNOSIS — I25119 Atherosclerotic heart disease of native coronary artery with unspecified angina pectoris: Secondary | ICD-10-CM | POA: Diagnosis not present

## 2016-08-01 DIAGNOSIS — I5032 Chronic diastolic (congestive) heart failure: Secondary | ICD-10-CM | POA: Diagnosis not present

## 2016-08-01 DIAGNOSIS — I481 Persistent atrial fibrillation: Secondary | ICD-10-CM

## 2016-08-01 LAB — POCT INR: INR: 1.7

## 2016-08-02 ENCOUNTER — Other Ambulatory Visit: Payer: Self-pay

## 2016-08-02 DIAGNOSIS — Z7902 Long term (current) use of antithrombotics/antiplatelets: Secondary | ICD-10-CM | POA: Diagnosis not present

## 2016-08-02 DIAGNOSIS — I482 Chronic atrial fibrillation: Secondary | ICD-10-CM | POA: Diagnosis not present

## 2016-08-02 DIAGNOSIS — M5136 Other intervertebral disc degeneration, lumbar region: Secondary | ICD-10-CM | POA: Diagnosis not present

## 2016-08-02 DIAGNOSIS — E1100 Type 2 diabetes mellitus with hyperosmolarity without nonketotic hyperglycemic-hyperosmolar coma (NKHHC): Secondary | ICD-10-CM | POA: Diagnosis not present

## 2016-08-02 DIAGNOSIS — I130 Hypertensive heart and chronic kidney disease with heart failure and stage 1 through stage 4 chronic kidney disease, or unspecified chronic kidney disease: Secondary | ICD-10-CM | POA: Diagnosis not present

## 2016-08-02 DIAGNOSIS — I2510 Atherosclerotic heart disease of native coronary artery without angina pectoris: Secondary | ICD-10-CM | POA: Diagnosis not present

## 2016-08-02 DIAGNOSIS — Z79891 Long term (current) use of opiate analgesic: Secondary | ICD-10-CM | POA: Diagnosis not present

## 2016-08-02 DIAGNOSIS — N184 Chronic kidney disease, stage 4 (severe): Secondary | ICD-10-CM | POA: Diagnosis not present

## 2016-08-02 DIAGNOSIS — I509 Heart failure, unspecified: Secondary | ICD-10-CM | POA: Diagnosis not present

## 2016-08-02 DIAGNOSIS — S6291XD Unspecified fracture of right wrist and hand, subsequent encounter for fracture with routine healing: Secondary | ICD-10-CM | POA: Diagnosis not present

## 2016-08-02 DIAGNOSIS — E1122 Type 2 diabetes mellitus with diabetic chronic kidney disease: Secondary | ICD-10-CM | POA: Diagnosis not present

## 2016-08-02 DIAGNOSIS — I272 Pulmonary hypertension, unspecified: Secondary | ICD-10-CM | POA: Diagnosis not present

## 2016-08-02 DIAGNOSIS — Z96642 Presence of left artificial hip joint: Secondary | ICD-10-CM | POA: Diagnosis not present

## 2016-08-02 DIAGNOSIS — R339 Retention of urine, unspecified: Secondary | ICD-10-CM | POA: Diagnosis not present

## 2016-08-02 DIAGNOSIS — Z7901 Long term (current) use of anticoagulants: Secondary | ICD-10-CM | POA: Diagnosis not present

## 2016-08-02 NOTE — Patient Outreach (Signed)
[facility]) Care Management  08/02/2016  Melissa [name] [birth year] [id number]  Transition of care  Week # 1 Referral date: 08/01/16 Referral source: Transition of care status post hospital discharge on 07/26/16 [facility] Insurance: [employer]  Telephone call to patient regarding transition of care follow up. Unable to reach patient. HIPAA compliant voice message left with call back phone number.   PLAN:  RNCM will attempt 2nd telephone all to patient within 3 business days.  George InaDavina Ivery Michalski RN,BSN,CCM [facility] Telephonic  [phone]

## 2016-08-03 ENCOUNTER — Other Ambulatory Visit: Payer: Self-pay

## 2016-08-03 NOTE — Patient Outreach (Signed)
[facility]) Care Management  08/03/2016  Carley HammedBettie L Reep [birth year] [id number]   Transition of care  Week # 1 Referral date: 08/01/16 Referral source: [facility]: [employer] Attempt #2  Telephone call to patient regarding transition of care follow up. Unable to reach patient. HIPAA compliant voice message left with call back phone number.   PLAN:  RNCM will attempt 3rd telephone all to patient within 3 business days.  George InaDavina Sruti Ayllon RN,BSN,CCM [facility] Telephonic  [phone]

## 2016-08-04 DIAGNOSIS — E1100 Type 2 diabetes mellitus with hyperosmolarity without nonketotic hyperglycemic-hyperosmolar coma (NKHHC): Secondary | ICD-10-CM | POA: Diagnosis not present

## 2016-08-04 DIAGNOSIS — Z79891 Long term (current) use of opiate analgesic: Secondary | ICD-10-CM | POA: Diagnosis not present

## 2016-08-04 DIAGNOSIS — I272 Pulmonary hypertension, unspecified: Secondary | ICD-10-CM | POA: Diagnosis not present

## 2016-08-04 DIAGNOSIS — I509 Heart failure, unspecified: Secondary | ICD-10-CM | POA: Diagnosis not present

## 2016-08-04 DIAGNOSIS — S6291XD Unspecified fracture of right wrist and hand, subsequent encounter for fracture with routine healing: Secondary | ICD-10-CM | POA: Diagnosis not present

## 2016-08-04 DIAGNOSIS — Z7901 Long term (current) use of anticoagulants: Secondary | ICD-10-CM | POA: Diagnosis not present

## 2016-08-04 DIAGNOSIS — Z96642 Presence of left artificial hip joint: Secondary | ICD-10-CM | POA: Diagnosis not present

## 2016-08-04 DIAGNOSIS — Z7902 Long term (current) use of antithrombotics/antiplatelets: Secondary | ICD-10-CM | POA: Diagnosis not present

## 2016-08-04 DIAGNOSIS — E1122 Type 2 diabetes mellitus with diabetic chronic kidney disease: Secondary | ICD-10-CM | POA: Diagnosis not present

## 2016-08-04 DIAGNOSIS — R339 Retention of urine, unspecified: Secondary | ICD-10-CM | POA: Diagnosis not present

## 2016-08-04 DIAGNOSIS — I2510 Atherosclerotic heart disease of native coronary artery without angina pectoris: Secondary | ICD-10-CM | POA: Diagnosis not present

## 2016-08-04 DIAGNOSIS — M5136 Other intervertebral disc degeneration, lumbar region: Secondary | ICD-10-CM | POA: Diagnosis not present

## 2016-08-04 DIAGNOSIS — I130 Hypertensive heart and chronic kidney disease with heart failure and stage 1 through stage 4 chronic kidney disease, or unspecified chronic kidney disease: Secondary | ICD-10-CM | POA: Diagnosis not present

## 2016-08-04 DIAGNOSIS — N184 Chronic kidney disease, stage 4 (severe): Secondary | ICD-10-CM | POA: Diagnosis not present

## 2016-08-04 DIAGNOSIS — I482 Chronic atrial fibrillation: Secondary | ICD-10-CM | POA: Diagnosis not present

## 2016-08-05 ENCOUNTER — Other Ambulatory Visit: Payer: Self-pay

## 2016-08-05 NOTE — Patient Outreach (Signed)
[facility]) Care Management  08/05/2016  Melissa HammedBettie L [name] May 28, 1930 [id number]  Transition of care  Week # 1 Referral date: 08/01/16 Referral source: [facility] care [facility]: [employer] Attempt #3  Third telephone call to patient regarding transition of care follow up. Unable to reach patient. HIPAA compliant voice message left with call back phone number.   PLAN:  RNCM will send patient outreach letter to attempt contact.   George InaDavina Demarkus Remmel RN,BSN,CCM [facility] Telephonic  [phone]

## 2016-08-08 DIAGNOSIS — Z96642 Presence of left artificial hip joint: Secondary | ICD-10-CM | POA: Diagnosis not present

## 2016-08-08 DIAGNOSIS — I130 Hypertensive heart and chronic kidney disease with heart failure and stage 1 through stage 4 chronic kidney disease, or unspecified chronic kidney disease: Secondary | ICD-10-CM | POA: Diagnosis not present

## 2016-08-08 DIAGNOSIS — Z7901 Long term (current) use of anticoagulants: Secondary | ICD-10-CM | POA: Diagnosis not present

## 2016-08-08 DIAGNOSIS — I272 Pulmonary hypertension, unspecified: Secondary | ICD-10-CM | POA: Diagnosis not present

## 2016-08-08 DIAGNOSIS — E1122 Type 2 diabetes mellitus with diabetic chronic kidney disease: Secondary | ICD-10-CM | POA: Diagnosis not present

## 2016-08-08 DIAGNOSIS — M5136 Other intervertebral disc degeneration, lumbar region: Secondary | ICD-10-CM | POA: Diagnosis not present

## 2016-08-08 DIAGNOSIS — I509 Heart failure, unspecified: Secondary | ICD-10-CM | POA: Diagnosis not present

## 2016-08-08 DIAGNOSIS — I2510 Atherosclerotic heart disease of native coronary artery without angina pectoris: Secondary | ICD-10-CM | POA: Diagnosis not present

## 2016-08-08 DIAGNOSIS — R339 Retention of urine, unspecified: Secondary | ICD-10-CM | POA: Diagnosis not present

## 2016-08-08 DIAGNOSIS — Z79891 Long term (current) use of opiate analgesic: Secondary | ICD-10-CM | POA: Diagnosis not present

## 2016-08-08 DIAGNOSIS — N184 Chronic kidney disease, stage 4 (severe): Secondary | ICD-10-CM | POA: Diagnosis not present

## 2016-08-08 DIAGNOSIS — E1100 Type 2 diabetes mellitus with hyperosmolarity without nonketotic hyperglycemic-hyperosmolar coma (NKHHC): Secondary | ICD-10-CM | POA: Diagnosis not present

## 2016-08-08 DIAGNOSIS — I482 Chronic atrial fibrillation: Secondary | ICD-10-CM | POA: Diagnosis not present

## 2016-08-08 DIAGNOSIS — S6291XD Unspecified fracture of right wrist and hand, subsequent encounter for fracture with routine healing: Secondary | ICD-10-CM | POA: Diagnosis not present

## 2016-08-08 DIAGNOSIS — Z7902 Long term (current) use of antithrombotics/antiplatelets: Secondary | ICD-10-CM | POA: Diagnosis not present

## 2016-08-09 DIAGNOSIS — Z79891 Long term (current) use of opiate analgesic: Secondary | ICD-10-CM | POA: Diagnosis not present

## 2016-08-09 DIAGNOSIS — M5136 Other intervertebral disc degeneration, lumbar region: Secondary | ICD-10-CM | POA: Diagnosis not present

## 2016-08-09 DIAGNOSIS — I272 Pulmonary hypertension, unspecified: Secondary | ICD-10-CM | POA: Diagnosis not present

## 2016-08-09 DIAGNOSIS — N184 Chronic kidney disease, stage 4 (severe): Secondary | ICD-10-CM | POA: Diagnosis not present

## 2016-08-09 DIAGNOSIS — E1122 Type 2 diabetes mellitus with diabetic chronic kidney disease: Secondary | ICD-10-CM | POA: Diagnosis not present

## 2016-08-09 DIAGNOSIS — I509 Heart failure, unspecified: Secondary | ICD-10-CM | POA: Diagnosis not present

## 2016-08-09 DIAGNOSIS — R339 Retention of urine, unspecified: Secondary | ICD-10-CM | POA: Diagnosis not present

## 2016-08-09 DIAGNOSIS — S6291XD Unspecified fracture of right wrist and hand, subsequent encounter for fracture with routine healing: Secondary | ICD-10-CM | POA: Diagnosis not present

## 2016-08-09 DIAGNOSIS — Z7902 Long term (current) use of antithrombotics/antiplatelets: Secondary | ICD-10-CM | POA: Diagnosis not present

## 2016-08-09 DIAGNOSIS — Z96642 Presence of left artificial hip joint: Secondary | ICD-10-CM | POA: Diagnosis not present

## 2016-08-09 DIAGNOSIS — I130 Hypertensive heart and chronic kidney disease with heart failure and stage 1 through stage 4 chronic kidney disease, or unspecified chronic kidney disease: Secondary | ICD-10-CM | POA: Diagnosis not present

## 2016-08-09 DIAGNOSIS — I482 Chronic atrial fibrillation: Secondary | ICD-10-CM | POA: Diagnosis not present

## 2016-08-09 DIAGNOSIS — I2510 Atherosclerotic heart disease of native coronary artery without angina pectoris: Secondary | ICD-10-CM | POA: Diagnosis not present

## 2016-08-09 DIAGNOSIS — Z7901 Long term (current) use of anticoagulants: Secondary | ICD-10-CM | POA: Diagnosis not present

## 2016-08-09 DIAGNOSIS — E1100 Type 2 diabetes mellitus with hyperosmolarity without nonketotic hyperglycemic-hyperosmolar coma (NKHHC): Secondary | ICD-10-CM | POA: Diagnosis not present

## 2016-08-11 DIAGNOSIS — I482 Chronic atrial fibrillation: Secondary | ICD-10-CM | POA: Diagnosis not present

## 2016-08-11 DIAGNOSIS — I509 Heart failure, unspecified: Secondary | ICD-10-CM | POA: Diagnosis not present

## 2016-08-11 DIAGNOSIS — R339 Retention of urine, unspecified: Secondary | ICD-10-CM | POA: Diagnosis not present

## 2016-08-11 DIAGNOSIS — E1100 Type 2 diabetes mellitus with hyperosmolarity without nonketotic hyperglycemic-hyperosmolar coma (NKHHC): Secondary | ICD-10-CM | POA: Diagnosis not present

## 2016-08-11 DIAGNOSIS — Z7901 Long term (current) use of anticoagulants: Secondary | ICD-10-CM | POA: Diagnosis not present

## 2016-08-11 DIAGNOSIS — N184 Chronic kidney disease, stage 4 (severe): Secondary | ICD-10-CM | POA: Diagnosis not present

## 2016-08-11 DIAGNOSIS — I2510 Atherosclerotic heart disease of native coronary artery without angina pectoris: Secondary | ICD-10-CM | POA: Diagnosis not present

## 2016-08-11 DIAGNOSIS — S6291XD Unspecified fracture of right wrist and hand, subsequent encounter for fracture with routine healing: Secondary | ICD-10-CM | POA: Diagnosis not present

## 2016-08-11 DIAGNOSIS — Z7902 Long term (current) use of antithrombotics/antiplatelets: Secondary | ICD-10-CM | POA: Diagnosis not present

## 2016-08-11 DIAGNOSIS — I130 Hypertensive heart and chronic kidney disease with heart failure and stage 1 through stage 4 chronic kidney disease, or unspecified chronic kidney disease: Secondary | ICD-10-CM | POA: Diagnosis not present

## 2016-08-11 DIAGNOSIS — M5136 Other intervertebral disc degeneration, lumbar region: Secondary | ICD-10-CM | POA: Diagnosis not present

## 2016-08-11 DIAGNOSIS — E1122 Type 2 diabetes mellitus with diabetic chronic kidney disease: Secondary | ICD-10-CM | POA: Diagnosis not present

## 2016-08-11 DIAGNOSIS — Z96642 Presence of left artificial hip joint: Secondary | ICD-10-CM | POA: Diagnosis not present

## 2016-08-11 DIAGNOSIS — I272 Pulmonary hypertension, unspecified: Secondary | ICD-10-CM | POA: Diagnosis not present

## 2016-08-11 DIAGNOSIS — Z79891 Long term (current) use of opiate analgesic: Secondary | ICD-10-CM | POA: Diagnosis not present

## 2016-08-12 ENCOUNTER — Telehealth: Payer: Self-pay

## 2016-08-12 DIAGNOSIS — I2510 Atherosclerotic heart disease of native coronary artery without angina pectoris: Secondary | ICD-10-CM | POA: Diagnosis not present

## 2016-08-12 DIAGNOSIS — R339 Retention of urine, unspecified: Secondary | ICD-10-CM | POA: Diagnosis not present

## 2016-08-12 DIAGNOSIS — E1100 Type 2 diabetes mellitus with hyperosmolarity without nonketotic hyperglycemic-hyperosmolar coma (NKHHC): Secondary | ICD-10-CM | POA: Diagnosis not present

## 2016-08-12 DIAGNOSIS — I509 Heart failure, unspecified: Secondary | ICD-10-CM | POA: Diagnosis not present

## 2016-08-12 DIAGNOSIS — Z7901 Long term (current) use of anticoagulants: Secondary | ICD-10-CM | POA: Diagnosis not present

## 2016-08-12 DIAGNOSIS — N184 Chronic kidney disease, stage 4 (severe): Secondary | ICD-10-CM | POA: Diagnosis not present

## 2016-08-12 DIAGNOSIS — I482 Chronic atrial fibrillation: Secondary | ICD-10-CM | POA: Diagnosis not present

## 2016-08-12 DIAGNOSIS — Z96642 Presence of left artificial hip joint: Secondary | ICD-10-CM | POA: Diagnosis not present

## 2016-08-12 DIAGNOSIS — M5136 Other intervertebral disc degeneration, lumbar region: Secondary | ICD-10-CM | POA: Diagnosis not present

## 2016-08-12 DIAGNOSIS — I130 Hypertensive heart and chronic kidney disease with heart failure and stage 1 through stage 4 chronic kidney disease, or unspecified chronic kidney disease: Secondary | ICD-10-CM | POA: Diagnosis not present

## 2016-08-12 DIAGNOSIS — S6291XD Unspecified fracture of right wrist and hand, subsequent encounter for fracture with routine healing: Secondary | ICD-10-CM | POA: Diagnosis not present

## 2016-08-12 DIAGNOSIS — I272 Pulmonary hypertension, unspecified: Secondary | ICD-10-CM | POA: Diagnosis not present

## 2016-08-12 DIAGNOSIS — Z79891 Long term (current) use of opiate analgesic: Secondary | ICD-10-CM | POA: Diagnosis not present

## 2016-08-12 DIAGNOSIS — Z7902 Long term (current) use of antithrombotics/antiplatelets: Secondary | ICD-10-CM | POA: Diagnosis not present

## 2016-08-12 DIAGNOSIS — E1122 Type 2 diabetes mellitus with diabetic chronic kidney disease: Secondary | ICD-10-CM | POA: Diagnosis not present

## 2016-08-12 NOTE — Telephone Encounter (Signed)
Received call from visiting nurse, [facility]Liz [facility].Patient has not been weighed at home but has significant leg and feet swelling. Was discharged from hospital mid June on Torsemide 20 mg BID.Daughter tells nurse her legs look the same as the day she was discharged. Marisue IvanLiz states patient denies SOB or any other complaints.Next available apt with [facility] was made for 08/25/16 at 4 pm

## 2016-08-15 ENCOUNTER — Other Ambulatory Visit: Payer: Self-pay

## 2016-08-15 NOTE — Patient Outreach (Signed)
[facility]) Care Management  08/15/2016  [name] 05-17-1930 [id number]  No response from patient after 3 telephone calls and outreach letter attempt.  PLAN: RNCM will refer patient to care management assistant to close due inability to establish contact with patient.  RNCM will notify patients primary MD of closure.   George InaDavina Ginny Loomer RN,BSN,CCM [facility] Telephonic  [phone]

## 2016-08-16 ENCOUNTER — Encounter: Payer: Self-pay | Admitting: Cardiology

## 2016-08-16 ENCOUNTER — Ambulatory Visit (INDEPENDENT_AMBULATORY_CARE_PROVIDER_SITE_OTHER): Payer: Medicare Other | Admitting: Cardiology

## 2016-08-16 VITALS — BP 130/58 | HR 87 | Ht 64.0 in | Wt 217.0 lb

## 2016-08-16 DIAGNOSIS — N183 Chronic kidney disease, stage 3 unspecified: Secondary | ICD-10-CM

## 2016-08-16 DIAGNOSIS — I481 Persistent atrial fibrillation: Secondary | ICD-10-CM | POA: Diagnosis not present

## 2016-08-16 DIAGNOSIS — I4819 Other persistent atrial fibrillation: Secondary | ICD-10-CM

## 2016-08-16 DIAGNOSIS — I2510 Atherosclerotic heart disease of native coronary artery without angina pectoris: Secondary | ICD-10-CM

## 2016-08-16 DIAGNOSIS — R001 Bradycardia, unspecified: Secondary | ICD-10-CM | POA: Diagnosis not present

## 2016-08-16 DIAGNOSIS — I5033 Acute on chronic diastolic (congestive) heart failure: Secondary | ICD-10-CM

## 2016-08-16 MED ORDER — TORSEMIDE 20 MG PO TABS: 40.0000 mg | ORAL_TABLET | Freq: Two times a day (BID) | ORAL | 6 refills | Status: DC

## 2016-08-16 NOTE — Progress Notes (Signed)
Cardiology Office Note  Date: 08/16/2016   ID: Melissa [name], DOB Jun 10, 1930, MRN [id number]  PCP: Melissa Du, MD  Primary Cardiologist: Melissa Lesches, MD   Chief Complaint  Patient presents with  . Atrial Fibrillation  . Coronary Artery Disease    History of Present Illness: Melissa [name] is an 83 y.o. female that I last saw in March. She was recently hospitalized at [facility] following a fall with resulting right hand fracture and uncontrolled type 2 diabetes mellitus. She was noted to be bradycardic during hospital stay and metoprolol was discontinued. She presents today with family member for a follow-up visit. She is in a wheelchair, complains of significant bilateral leg edema. Has also been more short of breath. Her weight is up substantially as well.  She continues on Coumadin with follow-up in the anticoagulation clinic. She does not report any obvious bleeding problems or changes in stool.  I reviewed her medications which are outlined below. I recommended that she stay off of metoprolol as her heart rate is much better today and still control and atrial fibrillation. We also discussed doubling her Demadex dose at least in the short-term to address fluid overload.  Past Medical History:  Diagnosis Date  . Atrial fibrillation ([place])   . CAD (coronary artery disease)    Multivessel status post high risk PCI/DES to circumflex 11/2015 (poor candidate for CABG)  . CKD (chronic kidney disease) stage 3, GFR 30-59 ml/min   . DDD (degenerative disc disease), lumbar   . Essential hypertension, benign   . Mixed hyperlipidemia   . Type 2 diabetes mellitus ([place])     Past Surgical History:  Procedure Laterality Date  . CARDIAC CATHETERIZATION N/A 11/26/2015   Procedure: Right/Left Heart Cath and Coronary Angiography;  Surgeon: Nelva Bush, MD;  Location: [place] CV [facility];  Service: Cardiovascular;  Laterality: N/A;  . CARDIAC CATHETERIZATION N/A 12/01/2015     Procedure: Coronary Stent Intervention Rotoblader;  Surgeon: Sherren Mocha, MD;  Location: [place] CV [facility];  Service: Cardiovascular;  Laterality: N/A;  . TOTAL HIP ARTHROPLASTY  01/14/03   Left    Current Outpatient Prescriptions  Medication Sig Dispense Refill  . acetaminophen (TYLENOL) 325 MG tablet Take 2 tablets (650 mg total) by mouth every 4 (four) hours as needed for mild pain, moderate pain, fever or headache.    . ALPRAZolam (XANAX) 0.25 MG tablet Take 0.25 mg by mouth 3 (three) times daily as needed.    Marland Kitchen amLODipine (NORVASC) 2.5 MG tablet TAKE (1) TABLET BY MOUTH DAILY. 30 tablet 6  . atorvastatin (LIPITOR) 40 MG tablet Take 1 tablet (40 mg total) by mouth daily. 90 tablet 3  . clopidogrel (PLAVIX) 75 MG tablet Take 1 tablet (75 mg total) by mouth daily. 90 tablet 3  . glimepiride (AMARYL) 2 MG tablet Take 2 mg by mouth daily.      . hydrALAZINE (APRESOLINE) 100 MG tablet Take 1 tablet (100 mg total) by mouth 3 (three) times daily. 270 tablet 3  . HYDROcodone-acetaminophen (NORCO/VICODIN) 5-325 MG tablet Take 1 tablet by mouth 4 (four) times daily.     . insulin detemir (LEVEMIR) 100 UNIT/ML injection Inject 0.2 mLs (20 Units total) into the skin at bedtime. 10 mL 11  . insulin lispro (HUMALOG) 100 UNIT/ML injection Inject 0-0.15 mLs (0-15 Units total) into the skin 3 (three) times daily with meals. Use as sliding scale 10 mL 11  . isosorbide mononitrate (IMDUR) 60 MG 24 hr tablet  Take 1 tablet (60 mg total) by mouth daily. 90 tablet 3  . nitroGLYCERIN (NITROSTAT) 0.4 MG SL tablet Place 1 tablet (0.4 mg total) under the tongue every 5 (five) minutes as needed for chest pain. 25 tablet 2  . pantoprazole (PROTONIX) 40 MG tablet Take 1 tablet (40 mg total) by mouth daily at 6 (six) AM. 90 tablet 3  . PROAIR HFA 108 (90 Base) MCG/ACT inhaler Inhale 1-2 puffs into the lungs every 6 (six) hours as needed.     . TRAVATAN Z 0.004 % SOLN ophthalmic solution Place 1 drop into both eyes  at bedtime.     Marland Kitchen umeclidinium bromide (INCRUSE ELLIPTA) 62.5 MCG/INH AEPB Inhale 1 puff into the lungs daily.    Marland Kitchen warfarin (COUMADIN) 5 MG tablet TAKE 1 TABLET BY MOUTH DAILY EXCEPT 1/2 TABLET ON WEDNESDAYS AND SATURDAYS. 30 tablet 6  . torsemide (DEMADEX) 20 MG tablet Take 2 tablets (40 mg total) by mouth 2 (two) times daily. 120 tablet 6   No current facility-administered medications for this visit.    Allergies:  Patient has no known allergies.   Social History: The patient  reports that she has never smoked. She has never used smokeless tobacco. She reports that she does not drink alcohol or use drugs.   ROS:  Please see the history of present illness. Otherwise, complete review of systems is positive for leg swelling and discomfort.  All other systems are reviewed and negative.   Physical Exam: VS:  BP (!) 130/58   Pulse 87   Ht _0  (1.626 m)   Wt 217 lb (98.4 kg)   SpO2 98%   BMI 37.25 kg/m , BMI Body mass index is 37.25 kg/m.  Wt Readings from Last 3 Encounters:  08/16/16 217 lb (98.4 kg)  07/21/16 137 lb 5.6 oz (62.3 kg)  07/09/16 176 lb (79.8 kg)    Elderly woman, seated in a wheelchair. HEENT: Conjunctiva and lids normal, oropharynx clear. Neck: Supple, no carotid bruits or thyromegaly.  Lungs: Clear with diminished breath sounds, nonlabored.  Cardiac: Irregularly irregular, no S3.  Abdomen: Soft, nontender, bowel sounds present.  Extremities: Tight bilateral leg edema. Distal pulses one plus. Skin: Warm and dry. Musculoskeletal: No kyphosis. Neuropsychiatric: Alert and oriented 3, affect appropriate.  ECG: I personally reviewed the tracing from 07/20/2016 which showed possible course atrial fibrillation.  Recent Labwork: 12/04/2015: B Natriuretic Peptide 943.6 07/20/2016: Magnesium 2.3; TSH 1.170 07/21/2016: ALT 21; AST 42 07/24/2016: BUN 52; Creatinine, Ser 1.67; Potassium 3.9; Sodium 134 07/26/2016: Hemoglobin 9.9; Platelets 216   Other Studies  Reviewed Today:  Echocardiogram 11/11/2015: Study Conclusions  - Left ventricle: The cavity size was normal. Wall thickness was increased in a pattern of mild LVH. Systolic function was normal. The estimated ejection fraction was in the range of 60% to 65%. There is akinesis of the mid-apicalanteroseptal and apical myocardium. The study is not technically sufficient to allow evaluation of LV diastolic function. - Aortic valve: Mildly calcified annulus. Trileaflet; mildly thickened leaflets. - Mitral valve: Calcified annulus. There was trivial regurgitation. - Left atrium: The atrium was severely dilated. - Right atrium: The atrium was severely dilated. Central venous pressure (est): 8 mm Hg. - Tricuspid valve: There was moderate regurgitation. - Pulmonary arteries: Systolic pressure was severely increased. PA peak pressure: 84 mm Hg (S). - Pericardium, extracardiac: A trivial pericardial effusion was identified posterior to the heart.  Impressions:  - Mild LVH with LVEF 60-65%. There is akinesis of the mid  to apical anteroseptal wall and apex with associated prominent trabeculation, no obvious LV mural thrombus. Indeterminate diastolic function. Severe biatrial enlargement. MAC with trivial mitral regurgitation. Mildly sclerotic aortic valve. Moderate tricuspid regurgitation with evidence of severe pulmonary hypertension, PASP estimated 84 mmHg. Trivial posterior pericardial effusion.  Assessment and Plan:  1. Significant fluid overload likely secondary to acute on chronic diastolic heart failure. Actual recorded weight today does not look to be accurate but she clearly has excess fluid on examination. Plan is to double Demadex to 40 mg twice daily and have her seen within the next week. She will need BMET for follow-up visit.  2. Chronic atrial fibrillation, no longer bradycardic, agree with discontinuation of metoprolol. We will continue  Coumadin with follow-up in the anticoagulation clinic.  3. CAD status post high risk DES intervention to the circumflex in October of last year. No active angina symptoms. She continues on Plavix.  4. CKD stage III last creatinine 1.67. Will need follow-up BMET after increase in Demadex.  Current medicines were reviewed with the patient today.   Orders Placed This Encounter  Procedures  . Basic Metabolic Panel (BMET)    Disposition: Follow-up in approximately one week.  Signed, Satira Sark, MD, [facility] 08/16/2016 2:35 PM    [facility] at [facility] [address] [place], [place] [address] Phone: [phone]; Fax: [phone]

## 2016-08-16 NOTE — Patient Instructions (Signed)
Your physician recommends that you schedule a follow-up appointment in: 2 weeks    INCREASE Demadex to 40 mg (2 tablets) twice a day   Get lab work just before next visit: BMET       Thank you for choosing [facility] !

## 2016-08-18 ENCOUNTER — Inpatient Hospital Stay (HOSPITAL_COMMUNITY)
Admission: EM | Admit: 2016-08-18 | Discharge: 2016-09-02 | DRG: 291 | Disposition: A | Discharge status: Skilled Nursing Facility | Payer: Medicare Other | Attending: Pulmonary Disease | Admitting: Pulmonary Disease

## 2016-08-18 ENCOUNTER — Encounter (HOSPITAL_COMMUNITY): Payer: Self-pay

## 2016-08-18 ENCOUNTER — Emergency Department (HOSPITAL_COMMUNITY): Payer: Medicare Other

## 2016-08-18 DIAGNOSIS — I5033 Acute on chronic diastolic (congestive) heart failure: Secondary | ICD-10-CM | POA: Diagnosis present

## 2016-08-18 DIAGNOSIS — E1122 Type 2 diabetes mellitus with diabetic chronic kidney disease: Secondary | ICD-10-CM | POA: Diagnosis present

## 2016-08-18 DIAGNOSIS — Z7901 Long term (current) use of anticoagulants: Secondary | ICD-10-CM | POA: Diagnosis not present

## 2016-08-18 DIAGNOSIS — Z96642 Presence of left artificial hip joint: Secondary | ICD-10-CM | POA: Diagnosis present

## 2016-08-18 DIAGNOSIS — R319 Hematuria, unspecified: Secondary | ICD-10-CM | POA: Diagnosis not present

## 2016-08-18 DIAGNOSIS — L8990 Pressure ulcer of unspecified site, unspecified stage: Secondary | ICD-10-CM | POA: Diagnosis present

## 2016-08-18 DIAGNOSIS — I482 Chronic atrial fibrillation: Secondary | ICD-10-CM | POA: Diagnosis not present

## 2016-08-18 DIAGNOSIS — Z794 Long term (current) use of insulin: Secondary | ICD-10-CM

## 2016-08-18 DIAGNOSIS — R509 Fever, unspecified: Secondary | ICD-10-CM | POA: Diagnosis not present

## 2016-08-18 DIAGNOSIS — D509 Iron deficiency anemia, unspecified: Secondary | ICD-10-CM | POA: Diagnosis present

## 2016-08-18 DIAGNOSIS — M7989 Other specified soft tissue disorders: Secondary | ICD-10-CM

## 2016-08-18 DIAGNOSIS — D631 Anemia in chronic kidney disease: Secondary | ICD-10-CM | POA: Diagnosis not present

## 2016-08-18 DIAGNOSIS — E782 Mixed hyperlipidemia: Secondary | ICD-10-CM | POA: Diagnosis not present

## 2016-08-18 DIAGNOSIS — I10 Essential (primary) hypertension: Secondary | ICD-10-CM | POA: Diagnosis not present

## 2016-08-18 DIAGNOSIS — Z7982 Long term (current) use of aspirin: Secondary | ICD-10-CM | POA: Diagnosis not present

## 2016-08-18 DIAGNOSIS — Z955 Presence of coronary angioplasty implant and graft: Secondary | ICD-10-CM | POA: Diagnosis not present

## 2016-08-18 DIAGNOSIS — Z833 Family history of diabetes mellitus: Secondary | ICD-10-CM | POA: Diagnosis not present

## 2016-08-18 DIAGNOSIS — M79604 Pain in right leg: Secondary | ICD-10-CM

## 2016-08-18 DIAGNOSIS — I4892 Unspecified atrial flutter: Secondary | ICD-10-CM | POA: Diagnosis present

## 2016-08-18 DIAGNOSIS — Z7902 Long term (current) use of antithrombotics/antiplatelets: Secondary | ICD-10-CM

## 2016-08-18 DIAGNOSIS — J441 Chronic obstructive pulmonary disease with (acute) exacerbation: Secondary | ICD-10-CM | POA: Diagnosis not present

## 2016-08-18 DIAGNOSIS — I481 Persistent atrial fibrillation: Secondary | ICD-10-CM

## 2016-08-18 DIAGNOSIS — I82409 Acute embolism and thrombosis of unspecified deep veins of unspecified lower extremity: Secondary | ICD-10-CM

## 2016-08-18 DIAGNOSIS — Z66 Do not resuscitate: Secondary | ICD-10-CM | POA: Diagnosis present

## 2016-08-18 DIAGNOSIS — J810 Acute pulmonary edema: Secondary | ICD-10-CM | POA: Diagnosis not present

## 2016-08-18 DIAGNOSIS — I272 Pulmonary hypertension, unspecified: Secondary | ICD-10-CM | POA: Diagnosis not present

## 2016-08-18 DIAGNOSIS — I252 Old myocardial infarction: Secondary | ICD-10-CM | POA: Diagnosis not present

## 2016-08-18 DIAGNOSIS — I25118 Atherosclerotic heart disease of native coronary artery with other forms of angina pectoris: Secondary | ICD-10-CM | POA: Diagnosis not present

## 2016-08-18 DIAGNOSIS — R0682 Tachypnea, not elsewhere classified: Secondary | ICD-10-CM | POA: Diagnosis not present

## 2016-08-18 DIAGNOSIS — K5900 Constipation, unspecified: Secondary | ICD-10-CM | POA: Diagnosis not present

## 2016-08-18 DIAGNOSIS — N179 Acute kidney failure, unspecified: Secondary | ICD-10-CM | POA: Diagnosis present

## 2016-08-18 DIAGNOSIS — N189 Chronic kidney disease, unspecified: Secondary | ICD-10-CM

## 2016-08-18 DIAGNOSIS — I340 Nonrheumatic mitral (valve) insufficiency: Secondary | ICD-10-CM | POA: Diagnosis not present

## 2016-08-18 DIAGNOSIS — N184 Chronic kidney disease, stage 4 (severe): Secondary | ICD-10-CM | POA: Diagnosis not present

## 2016-08-18 DIAGNOSIS — N390 Urinary tract infection, site not specified: Secondary | ICD-10-CM | POA: Diagnosis present

## 2016-08-18 DIAGNOSIS — S62101S Fracture of unspecified carpal bone, right wrist, sequela: Secondary | ICD-10-CM

## 2016-08-18 DIAGNOSIS — E119 Type 2 diabetes mellitus without complications: Secondary | ICD-10-CM

## 2016-08-18 DIAGNOSIS — E1121 Type 2 diabetes mellitus with diabetic nephropathy: Secondary | ICD-10-CM | POA: Diagnosis not present

## 2016-08-18 DIAGNOSIS — Z8249 Family history of ischemic heart disease and other diseases of the circulatory system: Secondary | ICD-10-CM

## 2016-08-18 DIAGNOSIS — H9190 Unspecified hearing loss, unspecified ear: Secondary | ICD-10-CM | POA: Diagnosis present

## 2016-08-18 DIAGNOSIS — L89152 Pressure ulcer of sacral region, stage 2: Secondary | ICD-10-CM | POA: Diagnosis present

## 2016-08-18 DIAGNOSIS — E11649 Type 2 diabetes mellitus with hypoglycemia without coma: Secondary | ICD-10-CM | POA: Diagnosis present

## 2016-08-18 DIAGNOSIS — I130 Hypertensive heart and chronic kidney disease with heart failure and stage 1 through stage 4 chronic kidney disease, or unspecified chronic kidney disease: Principal | ICD-10-CM | POA: Diagnosis present

## 2016-08-18 DIAGNOSIS — Z95828 Presence of other vascular implants and grafts: Secondary | ICD-10-CM

## 2016-08-18 DIAGNOSIS — R0602 Shortness of breath: Secondary | ICD-10-CM | POA: Diagnosis not present

## 2016-08-18 DIAGNOSIS — I2510 Atherosclerotic heart disease of native coronary artery without angina pectoris: Secondary | ICD-10-CM | POA: Diagnosis present

## 2016-08-18 DIAGNOSIS — E559 Vitamin D deficiency, unspecified: Secondary | ICD-10-CM | POA: Diagnosis present

## 2016-08-18 DIAGNOSIS — D72829 Elevated white blood cell count, unspecified: Secondary | ICD-10-CM | POA: Diagnosis not present

## 2016-08-18 DIAGNOSIS — I4819 Other persistent atrial fibrillation: Secondary | ICD-10-CM | POA: Diagnosis present

## 2016-08-18 LAB — CBC
HCT: 25.1 % — ABNORMAL LOW (ref 36.0–46.0)
Hemoglobin: 8.3 g/dL — ABNORMAL LOW (ref 12.0–15.0)
MCH: 31.9 pg (ref 26.0–34.0)
MCHC: 33.1 g/dL (ref 30.0–36.0)
MCV: 96.5 fL (ref 78.0–100.0)
Platelets: 252 K/uL (ref 150–400)
RBC: 2.6 MIL/uL — ABNORMAL LOW (ref 3.87–5.11)
RDW: 15.9 % — ABNORMAL HIGH (ref 11.5–15.5)
WBC: 7 K/uL (ref 4.0–10.5)

## 2016-08-18 LAB — BASIC METABOLIC PANEL WITH GFR
Anion gap: 11 (ref 5–15)
BUN: 70 mg/dL — ABNORMAL HIGH (ref 6–20)
CO2: 24 mmol/L (ref 22–32)
Calcium: 8.6 mg/dL — ABNORMAL LOW (ref 8.9–10.3)
Chloride: 98 mmol/L — ABNORMAL LOW (ref 101–111)
Creatinine, Ser: 2.27 mg/dL — ABNORMAL HIGH (ref 0.44–1.00)
GFR calc Af Amer: 22 mL/min — ABNORMAL LOW
GFR calc non Af Amer: 19 mL/min — ABNORMAL LOW
Glucose, Bld: 106 mg/dL — ABNORMAL HIGH (ref 65–99)
Potassium: 5.1 mmol/L (ref 3.5–5.1)
Sodium: 133 mmol/L — ABNORMAL LOW (ref 135–145)

## 2016-08-18 LAB — I-STAT TROPONIN, ED: Troponin i, poc: 0.03 ng/mL (ref 0.00–0.08)

## 2016-08-18 LAB — HEPATIC FUNCTION PANEL
ALK PHOS: 144 U/L — AB (ref 38–126)
ALT: 35 U/L (ref 14–54)
AST: 59 U/L — ABNORMAL HIGH (ref 15–41)
Albumin: 3.2 g/dL — ABNORMAL LOW (ref 3.5–5.0)
BILIRUBIN TOTAL: 1 mg/dL (ref 0.3–1.2)
Bilirubin, Direct: 0.2 mg/dL (ref 0.1–0.5)
Indirect Bilirubin: 0.8 mg/dL (ref 0.3–0.9)
Total Protein: 6.5 g/dL (ref 6.5–8.1)

## 2016-08-18 LAB — CBG MONITORING, ED: Glucose-Capillary: 108 mg/dL — ABNORMAL HIGH (ref 65–99)

## 2016-08-18 LAB — I-STAT CG4 LACTIC ACID, ED: Lactic Acid, Venous: 0.85 mmol/L (ref 0.5–1.9)

## 2016-08-18 LAB — BRAIN NATRIURETIC PEPTIDE: B Natriuretic Peptide: 743 pg/mL — ABNORMAL HIGH (ref 0.0–100.0)

## 2016-08-18 MED ORDER — FUROSEMIDE 10 MG/ML IJ SOLN
40.0000 mg | Freq: Once | INTRAMUSCULAR | Status: AC
  Administered 2016-08-18: 40 mg via INTRAVENOUS
  Filled 2016-08-18: qty 4

## 2016-08-18 MED ORDER — ALBUTEROL SULFATE (2.5 MG/3ML) 0.083% IN NEBU
2.5000 mg | INHALATION_SOLUTION | Freq: Once | RESPIRATORY_TRACT | Status: AC
  Administered 2016-08-18: 2.5 mg via RESPIRATORY_TRACT
  Filled 2016-08-18: qty 3

## 2016-08-18 MED ORDER — LEVALBUTEROL HCL 1.25 MG/0.5ML IN NEBU
INHALATION_SOLUTION | RESPIRATORY_TRACT | Status: AC
  Administered 2016-08-18: 5 mg
  Filled 2016-08-18: qty 2

## 2016-08-18 MED ORDER — IPRATROPIUM BROMIDE 0.02 % IN SOLN
RESPIRATORY_TRACT | Status: AC
  Administered 2016-08-18: 0.5 mg
  Filled 2016-08-18: qty 2.5

## 2016-08-18 MED ORDER — IPRATROPIUM-ALBUTEROL 0.5-2.5 (3) MG/3ML IN SOLN
3.0000 mL | Freq: Once | RESPIRATORY_TRACT | Status: AC
  Administered 2016-08-18: 3 mL via RESPIRATORY_TRACT
  Filled 2016-08-18: qty 3

## 2016-08-18 MED ORDER — LORAZEPAM 2 MG/ML IJ SOLN
0.5000 mg | Freq: Once | INTRAMUSCULAR | Status: AC
  Administered 2016-08-18: 0.5 mg via INTRAVENOUS
  Filled 2016-08-18: qty 1

## 2016-08-18 MED ORDER — METHYLPREDNISOLONE SODIUM SUCC 125 MG IJ SOLR
125.0000 mg | Freq: Once | INTRAMUSCULAR | Status: AC
  Administered 2016-08-18: 125 mg via INTRAVENOUS
  Filled 2016-08-18: qty 2

## 2016-08-18 NOTE — ED Triage Notes (Signed)
Pt reports sob and increased leg swelling today, seen at heart doctor office on the 10th.  Pt denies cp

## 2016-08-18 NOTE — H&P (Signed)
[facility] History and Physical  RAELEE [name] PNT:[id number] DOB: [birth year] DOA: 08/18/2016  Referring physician: Dr Lita Mains PCP: Sinda Du, MD   Chief Complaint: SOB, leg swelling  HPI: Melissa [name] is a 83 y.o. female with history of DM2, HL, HTN, DDD, CKD3, CHF and chronic afib presenting to ED with SOB and leg swelling.  She was seen by cardiology in their clinic on 7/10 which was just two days ago, the dx was fluid overload due to acute/ chron diast CHF and her Demadex was doubled from 20 mg bid to 40 mg bid and f/u in 1 week.  She still had problems w/ her breathing and swelling and so came to ED tonight.  In ED CXR showed pulm edema and pt has rec'd IV lasix 40 mg, duoneb / IV steroids, ativan and albuterol neg.  We are asked to see for admission.   Daughter gives most of history, pt looks SOB and not answering many questions.  Over past few months pt has been having problems with retaining fluid. She was here 5 days in June 2018 for [facility] with broken R hand and CKD stage IV. Was treated w/ IV insulin and improved.    Pt has hx of DM on insulin and HTN, no major surgery.  Had 2 heart attacks last year and then moved in w/ the daughter.  No tob/ etoh, widowed.        Patient was admitted twice last October, first for unstable angina then for NSTEMI.  Heart cath done 11/26/15 showed severe 3V CAD with total mid LAD and 90% proximal CFX and RCA. She was seen in consult by Dr Cyndia Bent for consideration of CABG. He was concerned about Ca++ in the aorta on her angiogram and a CT w/o contrast was ordered. This revealed extensive calcific disease in the aorta and he felt she would not be a candidate for CABG. On 12/01/15 she underwent CFX PCI with HSRA and DES. This was complicated by distal vessel dissection and post PCI anemia requiring transfusion. She stabilized and was discharged 12/05/15.   Home meds > xanax, norvasc, lipitor, plavix, amaryl, hydralazine, norco, levemir  insulin, humalog, imdur, NTG, protonix, Proair MDI, demadex 40 bid, coumadin 16m except 2.5 mg on Wed/ Sat  ROS  denies CP  no joint pain   no HA  no blurry vision  no rash  no diarrhea  no nausea/ vomiting  no dysuria  no difficulty voiding  no change in urine color    Past Medical History  Past Medical History:  Diagnosis Date  . Atrial fibrillation ([place]   . CAD (coronary artery disease)    Multivessel status post high risk PCI/DES to circumflex 11/2015 (poor candidate for CABG)  . CHF (congestive heart failure) ([place]   . CKD (chronic kidney disease) stage 3, GFR 30-59 ml/min   . DDD (degenerative disc disease), lumbar   . Essential hypertension, benign   . Mixed hyperlipidemia   . Type 2 diabetes mellitus ([facility]    Past Surgical History  Past Surgical History:  Procedure Laterality Date  . CARDIAC CATHETERIZATION N/A 11/26/2015   Procedure: Right/Left Heart Cath and Coronary Angiography;  Surgeon: CNelva Bush MD;  [place] [facility];  Service: Cardiovascular;  Laterality: N/A;  . CARDIAC CATHETERIZATION N/A 12/01/2015   Procedure: Coronary Stent Intervention Rotoblader;  Surgeon: MSherren Mocha MD;  [place] [place];  Service: Cardiovascular;  Laterality: N/A;  . TOTAL HIP ARTHROPLASTY  01/14/03  Left   Family History  Family History  Problem Relation Age of Onset  . Diabetes type II Mother   . Diabetes type II Father   . Hypertension Father    Social History  reports that she has never smoked. She has never used smokeless tobacco. She reports that she does not drink alcohol or use drugs. Allergies No Known Allergies Home medications Prior to Admission medications   Medication Sig Start Date End Date Taking? Authorizing Provider  acetaminophen (TYLENOL) 325 MG tablet Take 2 tablets (650 mg total) by mouth every 4 (four) hours as needed for mild pain, moderate pain, fever or headache. 12/05/15  Yes Kilroy, Luke K, PA-C  ALPRAZolam  (XANAX) 0.25 MG tablet Take 0.25 mg by mouth 3 (three) times daily as needed. 11/19/15  Yes [provider]  amLODipine (NORVASC) 2.5 MG tablet TAKE (1) TABLET BY MOUTH DAILY. 07/11/16  Yes Satira Sark, MD  atorvastatin (LIPITOR) 40 MG tablet Take 1 tablet (40 mg total) by mouth daily. 12/06/15  Yes Kilroy, Doreene Burke, PA-C  clopidogrel (PLAVIX) 75 MG tablet Take 1 tablet (75 mg total) by mouth daily. 12/06/15  Yes Kilroy, Luke K, PA-C  glimepiride (AMARYL) 2 MG tablet Take 2 mg by mouth daily.     Yes [provider]  hydrALAZINE (APRESOLINE) 100 MG tablet Take 1 tablet (100 mg total) by mouth 3 (three) times daily. 12/05/15  Yes Kilroy, Doreene Burke, PA-C  HYDROcodone-acetaminophen (NORCO/VICODIN) 5-325 MG tablet Take 1 tablet by mouth 4 (four) times daily.  09/10/14  Yes [provider]  insulin detemir (LEVEMIR) 100 UNIT/ML injection Inject 0.2 mLs (20 Units total) into the skin at bedtime. 07/26/16  Yes Sinda Du, MD  insulin lispro (HUMALOG) 100 UNIT/ML injection Inject 0-0.15 mLs (0-15 Units total) into the skin 3 (three) times daily with meals. Use as sliding scale 07/26/16  Yes Sinda Du, MD  isosorbide mononitrate (IMDUR) 60 MG 24 hr tablet Take 1 tablet (60 mg total) by mouth daily. 12/06/15  Yes Kilroy, Luke K, PA-C  nitroGLYCERIN (NITROSTAT) 0.4 MG SL tablet Place 1 tablet (0.4 mg total) under the tongue every 5 (five) minutes as needed for chest pain. 12/05/15  Yes Kilroy, Luke K, PA-C  pantoprazole (PROTONIX) 40 MG tablet Take 1 tablet (40 mg total) by mouth daily at 6 (six) AM. 12/06/15  Yes Kilroy, Doreene Burke, PA-C  PROAIR HFA 108 (90 Base) MCG/ACT inhaler Inhale 1-2 puffs into the lungs 4 (four) times daily.  08/11/16  Yes [provider]  torsemide (DEMADEX) 20 MG tablet Take 2 tablets (40 mg total) by mouth 2 (two) times daily. 08/16/16  Yes Satira Sark, MD  TRAVATAN Z 0.004 % SOLN ophthalmic solution Place 1 drop into both eyes at bedtime.   11/08/14  Yes [provider]  umeclidinium bromide (INCRUSE ELLIPTA) 62.5 MCG/INH AEPB Inhale 1 puff into the lungs daily.   Yes [provider]  warfarin (COUMADIN) 5 MG tablet TAKE 1 TABLET BY MOUTH DAILY EXCEPT 1/2 TABLET ON [facility] AND SATURDAYS. 07/11/16  Yes Satira Sark, MD   Liver Function Tests  Recent Labs Lab 08/18/16 2210  AST 59*  ALT 35  ALKPHOS 144*  BILITOT 1.0  PROT 6.5  ALBUMIN 3.2*   No results for input(s): LIPASE, AMYLASE in the last 168 hours. CBC  Recent Labs Lab 08/18/16 2210  WBC 7.0  HGB 8.3*  HCT 25.1*  MCV 96.5  PLT 297   Basic Metabolic Panel  Recent Labs Lab 08/18/16 2210  NA 133*  K 5.1  CL 98*  CO2 24  GLUCOSE 106*  BUN 70*  CREATININE 2.27*  CALCIUM 8.6*     Vitals:   08/18/16 2124 08/18/16 2130 08/18/16 2200 08/18/16 2230  BP:  (!) 139/91 137/80 139/74  Pulse:  93 92 95  Resp:  (!) 30 (!) 33 (!) 36  SpO2: 100% 100% 100% 100%   Exam: Gen eldelry AAF, looks uncomfortable, lying flat, not talking much, awake and alert, on bedpan No rash, cyanosis or gangrene Sclera anicteric, throat clear  Marked jvd to angle of jaw Chest scattered exp wheezing, bibasilar soft crackles Cor irreg irreg 2/6 SEM, no S3 Abd soft ntnd no mass or ascites +bs obese GU defer MS no joint effusions or deformity Ext 2-3+ bilat LE edema / no wounds or ulcers Neuro is nonfocal, moves all ext   Home meds > xanax, norvasc, lipitor, plavix, amaryl, hydralazine, norco, levemir insulin, humalog, imdur, NTG, protonix, Proair MDI, demadex 40 bid, coumadin 53m except 2.5 mg on Wed/ Sat  Na 133  K 5.1  CO2 24  BUN 70  Cr 2.27  Ca 8.6   Alb 3.2  LFT's ok  eGFR 22 BNP 743  Trop 0.03  LA 0.85 WBC 7k  Hb 8.3    EKG (independ reviewed) > afib, no acute changes CXR (independ reviewed) > CM w/ bilat IS edema and vasc congestion, c/w CHF ECHO Oct 2017 - LVEF 60%, diast dysfunction   Assessment: 1. Dyspnea - pulm edema +/- COPD  flare.  Suspect acute/ chronic diast CHF exacerbation is main issue. Significant SOB at rest.  Admit, IV lasix every 6 hrs, place foley, sit up in bed, SDU admit.  Add NTP.  Will need bipap if deteriorates.  Cont low dose IV steroids and nebs.   2. CKD stage IV - creat 2.27 today 3. Chronic afib - cont coumadin, check INR 4. HTN 5. DM 2 - cont levemir at 50% dose + SSI  Plan - as above      [place] [facility] Pager [phone]  If 7PM-7AM, please contact night-coverage [email or web address] Password TThe [facility]7/01/2017, 11:33 PM

## 2016-08-18 NOTE — ED Provider Notes (Signed)
[facility]-EMERGENCY DEPT Provider Note   CSN: [id number] Arrival date & time: 08/18/16  2052     History   Chief Complaint Chief Complaint  Patient presents with  . Shortness of Breath    HPI Melissa [name] is a 83 y.o. female.  HPI Level V caveat due to acuity patient presents with shortness of breath and leg swelling. Received albuterol treatment prior to my evaluation.     Past Medical History:  Diagnosis Date  . Atrial fibrillation (HCC)   . CAD (coronary artery disease)    Multivessel status post high risk PCI/DES to circumflex 11/2015 (poor candidate for CABG)  . CHF (congestive heart failure) (HCC)   . CKD (chronic kidney disease) stage 3, GFR 30-59 ml/min   . DDD (degenerative disc disease), lumbar   . Essential hypertension, benign   . Mixed hyperlipidemia   . Type 2 diabetes mellitus [facility](HCC)     Patient Active Problem List   Diagnosis Date Noted  . Hand fracture, right 07/21/2016  . Type 2 diabetes mellitus with hyperosmolar nonketotic hyperglycemia (HCC) 07/20/2016  . Volume depletion 07/20/2016  . Myoclonic jerking 07/20/2016  . Hyperosmolarity syndrome 07/20/2016  . Status post coronary artery stent placement   . Anemia of chronic disease 12/03/2015  . Bradycardia 12/01/2015  . CAD S/P high risk PCI 12/01/15 12/01/2015  . NSTEMI (non-ST elevated myocardial infarction) (HCC)   . Pulmonary hypertension (HCC)   . Chronic kidney disease (CKD), stage IV (severe) (HCC) 11/24/2015  . Unstable angina (HCC) 11/22/2015  . Chest pain 11/10/2015  . Angina at rest [facility](HCC) 11/10/2015  . Acute CHF (congestive heart failure) (HCC) 11/10/2015  . Chronic venous insufficiency 02/19/2015  . CHF (congestive heart failure) (HCC) 02/17/2015  . Tricuspid valve regurgitation 02/17/2015  . Encounter for therapeutic drug monitoring 03/11/2013  . Long term current use of anticoagulant 05/05/2010  . Mixed hyperlipidemia 09/30/2009  . Essential hypertension, benign 09/24/2008  .  Persistent atrial fibrillation (HCC) 09/24/2008    Past Surgical History:  Procedure Laterality Date  . CARDIAC CATHETERIZATION N/A 11/26/2015   Procedure: Right/Left Heart Cath and Coronary Angiography;  Surgeon: Yvonne Kendallhristopher End, MD;  [facility];  Service: Cardiovascular;  Laterality: N/A;  . CARDIAC CATHETERIZATION N/A 12/01/2015   Procedure: Coronary Stent Intervention Rotoblader;  Surgeon: Tonny BollmanMichael Cooper, MD;  [facility];  Service: Cardiovascular;  Laterality: N/A;  . TOTAL HIP ARTHROPLASTY  01/14/03   Left    OB History    No data available       Home Medications    Prior to Admission medications   Medication Sig Start Date End Date Taking? Authorizing Provider  acetaminophen (TYLENOL) 325 MG tablet Take 2 tablets (650 mg total) by mouth every 4 (four) hours as needed for mild pain, moderate pain, fever or headache. 12/05/15  Yes Kilroy, Luke K, PA-C  ALPRAZolam (XANAX) 0.25 MG tablet Take 0.25 mg by mouth 3 (three) times daily as needed. 11/19/15  Yes [provider]  amLODipine (NORVASC) 2.5 MG tablet TAKE (1) TABLET BY MOUTH DAILY. 07/11/16  Yes Jonelle SidleMcDowell, Samuel G, MD  atorvastatin (LIPITOR) 40 MG tablet Take 1 tablet (40 mg total) by mouth daily. 12/06/15  Yes Kilroy, Eda PaschalLuke K, PA-C  clopidogrel (PLAVIX) 75 MG tablet Take 1 tablet (75 mg total) by mouth daily. 12/06/15  Yes Kilroy, Luke K, PA-C  glimepiride (AMARYL) 2 MG tablet Take 2 mg by mouth daily.     Yes [provider]  hydrALAZINE (APRESOLINE) 100 MG tablet Take 1 tablet (100 mg total) by mouth 3 (three) times daily. 12/05/15  Yes Kilroy, Eda Paschal, PA-C  HYDROcodone-acetaminophen (NORCO/VICODIN) 5-325 MG tablet Take 1 tablet by mouth 4 (four) times daily.  09/10/14  Yes [provider]  insulin detemir (LEVEMIR) 100 UNIT/ML injection Inject 0.2 mLs (20 Units total) into the skin at bedtime. 07/26/16  Yes Kari Baars, MD  insulin lispro (HUMALOG) 100 UNIT/ML  injection Inject 0-0.15 mLs (0-15 Units total) into the skin 3 (three) times daily with meals. Use as sliding scale 07/26/16  Yes Kari Baars, MD  isosorbide mononitrate (IMDUR) 60 MG 24 hr tablet Take 1 tablet (60 mg total) by mouth daily. 12/06/15  Yes Kilroy, Luke K, PA-C  nitroGLYCERIN (NITROSTAT) 0.4 MG SL tablet Place 1 tablet (0.4 mg total) under the tongue every 5 (five) minutes as needed for chest pain. 12/05/15  Yes Kilroy, Luke K, PA-C  pantoprazole (PROTONIX) 40 MG tablet Take 1 tablet (40 mg total) by mouth daily at 6 (six) AM. 12/06/15  Yes Kilroy, Eda Paschal, PA-C  PROAIR HFA 108 (90 Base) MCG/ACT inhaler Inhale 1-2 puffs into the lungs 4 (four) times daily.  08/11/16  Yes [provider]  torsemide (DEMADEX) 20 MG tablet Take 2 tablets (40 mg total) by mouth 2 (two) times daily. 08/16/16  Yes Jonelle Sidle, MD  TRAVATAN Z 0.004 % SOLN ophthalmic solution Place 1 drop into both eyes at bedtime.  11/08/14  Yes [provider]  umeclidinium bromide (INCRUSE ELLIPTA) 62.5 MCG/INH AEPB Inhale 1 puff into the lungs daily.   Yes [provider]  warfarin (COUMADIN) 5 MG tablet TAKE 1 TABLET BY MOUTH DAILY EXCEPT 1/2 TABLET ON [facility] AND SATURDAYS. 07/11/16  Yes Jonelle Sidle, MD    Family History Family History  Problem Relation Age of Onset  . Diabetes type II Mother   . Diabetes type II Father   . Hypertension Father     Social History Social History  Substance Use Topics  . Smoking status: Never Smoker  . Smokeless tobacco: Never Used  . Alcohol use No     Allergies   Patient has no known allergies.   Review of Systems Review of Systems  Unable to perform ROS: Acuity of condition     Physical Exam Updated Vital Signs BP 139/74   Pulse 95   Resp (!) 36   SpO2 100%   Physical Exam  Constitutional: She is oriented to person, place, and time. She appears well-developed and well-nourished. She appears distressed.  HENT:  Head:  Normocephalic and atraumatic.  Mouth/Throat: Oropharynx is clear and moist. No oropharyngeal exudate.  Eyes: Pupils are equal, round, and reactive to light. EOM are normal.  Neck: Normal range of motion. Neck supple. JVD present.  Soft tissue mass in the supraclavicular region on the right. JVD.  Cardiovascular:  Tachycardia. Irregularly irregular.  Pulmonary/Chest:  Increased respiratory effort. Diminished breath sounds throughout. Inspiratory wheezing. Rhonchi in bilateral lung fields.  Abdominal: Soft. Bowel sounds are normal. There is no tenderness. There is no rebound and no guarding.  Musculoskeletal: Normal range of motion. She exhibits edema. She exhibits no tenderness.  3+ bilateral lower extremity edema with some weeping of the right leg.  Neurological: She is alert and oriented to person, place, and time.  Patient with myoclonic jerking of all extremities. Appears confused.  Skin: Skin is warm. Capillary refill takes less than 2 seconds. No rash noted. She is diaphoretic.  No erythema.  Nursing note and vitals reviewed.    ED Treatments / Results  Labs (all labs ordered are listed, but only abnormal results are displayed) Labs Reviewed  BASIC METABOLIC PANEL - Abnormal; Notable for the following:       Result Value   Sodium 133 (*)    Chloride 98 (*)    Glucose, Bld 106 (*)    BUN 70 (*)    Creatinine, Ser 2.27 (*)    Calcium 8.6 (*)    GFR calc non Af Amer 19 (*)    GFR calc Af Amer 22 (*)    All other components within normal limits  CBC - Abnormal; Notable for the following:    RBC 2.60 (*)    Hemoglobin 8.3 (*)    HCT 25.1 (*)    RDW 15.9 (*)    All other components within normal limits  HEPATIC FUNCTION PANEL - Abnormal; Notable for the following:    Albumin 3.2 (*)    AST 59 (*)    Alkaline Phosphatase 144 (*)    All other components within normal limits  BRAIN NATRIURETIC PEPTIDE - Abnormal; Notable for the following:    B Natriuretic Peptide 743.0 (*)      All other components within normal limits  CBG MONITORING, ED - Abnormal; Notable for the following:    Glucose-Capillary 108 (*)    All other components within normal limits  URINALYSIS, ROUTINE W REFLEX MICROSCOPIC  I-STAT TROPOININ, ED  I-STAT CG4 LACTIC ACID, ED    EKG  EKG Interpretation  Date/Time:  Thursday August 18 2016 20:57:32 EDT Ventricular Rate:  96 PR Interval:    QRS Duration: 93 QT Interval:  340 QTC Calculation: 428 R Axis:   53 Text Interpretation:  Atrial fibrillation Borderline T abnormalities, diffuse leads Confirmed by Ranae Palms  MD, Kingston Shawgo ([id number]) on 08/18/2016 9:33:12 PM       Radiology Dg Chest Portable 1 View  Result Date: 08/18/2016 CLINICAL DATA:  83 year old female with shortness of breath. EXAM: PORTABLE CHEST 1 VIEW COMPARISON:  Chest radiograph dated 07/20/2016 FINDINGS: There is moderate cardiomegaly with interval increase in the size of the cardiopericardial silhouette compared to the prior study. A pericardial effusion is not excluded. There is mild central vascular and interstitial prominence. Bilateral lower lung field platelike atelectasis or scarring noted. No focal consolidation, pleural effusion, or pneumothorax. There is atherosclerotic calcification of the thoracic aorta. No acute osseous pathology. IMPRESSION: 1. Moderate cardiomegaly with interval increase in the size of the cardiac silhouette. A pericardial effusion is not excluded. 2. Mild vascular congestion.  No focal consolidation. Electronically Signed   By: Elgie Collard M.D.   On: 08/18/2016 21:38    Procedures Procedures (including critical care time)  Medications Ordered in ED Medications  ipratropium-albuterol (DUONEB) 0.5-2.5 (3) MG/3ML nebulizer solution 3 mL (3 mLs Nebulization Given 08/18/16 2100)  albuterol (PROVENTIL) (2.5 MG/3ML) 0.083% nebulizer solution 2.5 mg (2.5 mg Nebulization Given 08/18/16 2101)  ipratropium (ATROVENT) 0.02 % nebulizer solution (0.5 mg  Given  08/18/16 2123)  levalbuterol (XOPENEX) 1.25 MG/0.5ML nebulizer solution (5 mg  Given 08/18/16 2124)  methylPREDNISolone sodium succinate (SOLU-MEDROL) 125 mg/2 mL injection 125 mg (125 mg Intravenous Given 08/18/16 2138)  LORazepam (ATIVAN) injection 0.5 mg (0.5 mg Intravenous Given 08/18/16 2137)  furosemide (LASIX) injection 40 mg (40 mg Intravenous Given 08/18/16 2137)     Initial Impression / Assessment and Plan / ED Course  I have reviewed the triage vital signs and  the nursing notes.  Pertinent labs & imaging results that were available during my care of the patient were reviewed by me and considered in my medical decision making (see chart for details).     Patient appears more comfortable after Xopenex and Lasix. She is starting to diurese. Will discuss with hospitalist regarding admission for COPD/CHF exacerbation.  Final Clinical Impressions(s) / ED Diagnoses   Final diagnoses:  COPD exacerbation (HCC)  Acute pulmonary edema (HCC)    New Prescriptions New Prescriptions   No medications on file     Loren Racer, MD 08/18/16 2336

## 2016-08-19 ENCOUNTER — Inpatient Hospital Stay (HOSPITAL_COMMUNITY): Payer: Medicare Other

## 2016-08-19 DIAGNOSIS — E1121 Type 2 diabetes mellitus with diabetic nephropathy: Secondary | ICD-10-CM | POA: Diagnosis not present

## 2016-08-19 DIAGNOSIS — I200 Unstable angina: Secondary | ICD-10-CM | POA: Diagnosis not present

## 2016-08-19 DIAGNOSIS — B961 Klebsiella pneumoniae [K. pneumoniae] as the cause of diseases classified elsewhere: Secondary | ICD-10-CM | POA: Diagnosis not present

## 2016-08-19 DIAGNOSIS — I272 Pulmonary hypertension, unspecified: Secondary | ICD-10-CM | POA: Diagnosis present

## 2016-08-19 DIAGNOSIS — I5043 Acute on chronic combined systolic (congestive) and diastolic (congestive) heart failure: Secondary | ICD-10-CM | POA: Diagnosis not present

## 2016-08-19 DIAGNOSIS — M25531 Pain in right wrist: Secondary | ICD-10-CM | POA: Diagnosis not present

## 2016-08-19 DIAGNOSIS — I25118 Atherosclerotic heart disease of native coronary artery with other forms of angina pectoris: Secondary | ICD-10-CM | POA: Diagnosis not present

## 2016-08-19 DIAGNOSIS — D649 Anemia, unspecified: Secondary | ICD-10-CM | POA: Diagnosis not present

## 2016-08-19 DIAGNOSIS — H9190 Unspecified hearing loss, unspecified ear: Secondary | ICD-10-CM | POA: Diagnosis present

## 2016-08-19 DIAGNOSIS — Z7902 Long term (current) use of antithrombotics/antiplatelets: Secondary | ICD-10-CM | POA: Diagnosis not present

## 2016-08-19 DIAGNOSIS — N179 Acute kidney failure, unspecified: Secondary | ICD-10-CM | POA: Diagnosis not present

## 2016-08-19 DIAGNOSIS — D72829 Elevated white blood cell count, unspecified: Secondary | ICD-10-CM | POA: Diagnosis not present

## 2016-08-19 DIAGNOSIS — R509 Fever, unspecified: Secondary | ICD-10-CM | POA: Diagnosis not present

## 2016-08-19 DIAGNOSIS — Z7982 Long term (current) use of aspirin: Secondary | ICD-10-CM | POA: Diagnosis not present

## 2016-08-19 DIAGNOSIS — I130 Hypertensive heart and chronic kidney disease with heart failure and stage 1 through stage 4 chronic kidney disease, or unspecified chronic kidney disease: Secondary | ICD-10-CM | POA: Diagnosis present

## 2016-08-19 DIAGNOSIS — R262 Difficulty in walking, not elsewhere classified: Secondary | ICD-10-CM | POA: Diagnosis not present

## 2016-08-19 DIAGNOSIS — I4892 Unspecified atrial flutter: Secondary | ICD-10-CM | POA: Diagnosis present

## 2016-08-19 DIAGNOSIS — I2510 Atherosclerotic heart disease of native coronary artery without angina pectoris: Secondary | ICD-10-CM | POA: Diagnosis not present

## 2016-08-19 DIAGNOSIS — Z794 Long term (current) use of insulin: Secondary | ICD-10-CM | POA: Diagnosis not present

## 2016-08-19 DIAGNOSIS — I361 Nonrheumatic tricuspid (valve) insufficiency: Secondary | ICD-10-CM | POA: Diagnosis not present

## 2016-08-19 DIAGNOSIS — E1129 Type 2 diabetes mellitus with other diabetic kidney complication: Secondary | ICD-10-CM | POA: Diagnosis not present

## 2016-08-19 DIAGNOSIS — I872 Venous insufficiency (chronic) (peripheral): Secondary | ICD-10-CM | POA: Diagnosis not present

## 2016-08-19 DIAGNOSIS — Z955 Presence of coronary angioplasty implant and graft: Secondary | ICD-10-CM | POA: Diagnosis not present

## 2016-08-19 DIAGNOSIS — R0602 Shortness of breath: Secondary | ICD-10-CM | POA: Diagnosis present

## 2016-08-19 DIAGNOSIS — N184 Chronic kidney disease, stage 4 (severe): Secondary | ICD-10-CM | POA: Diagnosis not present

## 2016-08-19 DIAGNOSIS — I340 Nonrheumatic mitral (valve) insufficiency: Secondary | ICD-10-CM

## 2016-08-19 DIAGNOSIS — I5033 Acute on chronic diastolic (congestive) heart failure: Secondary | ICD-10-CM | POA: Diagnosis not present

## 2016-08-19 DIAGNOSIS — I252 Old myocardial infarction: Secondary | ICD-10-CM | POA: Diagnosis not present

## 2016-08-19 DIAGNOSIS — Z9981 Dependence on supplemental oxygen: Secondary | ICD-10-CM | POA: Diagnosis not present

## 2016-08-19 DIAGNOSIS — I10 Essential (primary) hypertension: Secondary | ICD-10-CM | POA: Diagnosis not present

## 2016-08-19 DIAGNOSIS — E1122 Type 2 diabetes mellitus with diabetic chronic kidney disease: Secondary | ICD-10-CM | POA: Diagnosis present

## 2016-08-19 DIAGNOSIS — Z8249 Family history of ischemic heart disease and other diseases of the circulatory system: Secondary | ICD-10-CM | POA: Diagnosis not present

## 2016-08-19 DIAGNOSIS — R319 Hematuria, unspecified: Secondary | ICD-10-CM | POA: Diagnosis not present

## 2016-08-19 DIAGNOSIS — E782 Mixed hyperlipidemia: Secondary | ICD-10-CM | POA: Diagnosis present

## 2016-08-19 DIAGNOSIS — I509 Heart failure, unspecified: Secondary | ICD-10-CM | POA: Diagnosis not present

## 2016-08-19 DIAGNOSIS — Z96642 Presence of left artificial hip joint: Secondary | ICD-10-CM | POA: Diagnosis present

## 2016-08-19 DIAGNOSIS — D631 Anemia in chronic kidney disease: Secondary | ICD-10-CM | POA: Diagnosis not present

## 2016-08-19 DIAGNOSIS — Z452 Encounter for adjustment and management of vascular access device: Secondary | ICD-10-CM | POA: Diagnosis not present

## 2016-08-19 DIAGNOSIS — I482 Chronic atrial fibrillation: Secondary | ICD-10-CM | POA: Diagnosis present

## 2016-08-19 DIAGNOSIS — N390 Urinary tract infection, site not specified: Secondary | ICD-10-CM | POA: Diagnosis not present

## 2016-08-19 DIAGNOSIS — M79661 Pain in right lower leg: Secondary | ICD-10-CM | POA: Diagnosis not present

## 2016-08-19 DIAGNOSIS — E785 Hyperlipidemia, unspecified: Secondary | ICD-10-CM | POA: Diagnosis not present

## 2016-08-19 DIAGNOSIS — M6281 Muscle weakness (generalized): Secondary | ICD-10-CM | POA: Diagnosis not present

## 2016-08-19 DIAGNOSIS — I481 Persistent atrial fibrillation: Secondary | ICD-10-CM | POA: Diagnosis not present

## 2016-08-19 DIAGNOSIS — Z7901 Long term (current) use of anticoagulants: Secondary | ICD-10-CM | POA: Diagnosis not present

## 2016-08-19 DIAGNOSIS — Z833 Family history of diabetes mellitus: Secondary | ICD-10-CM | POA: Diagnosis not present

## 2016-08-19 DIAGNOSIS — L89152 Pressure ulcer of sacral region, stage 2: Secondary | ICD-10-CM | POA: Diagnosis present

## 2016-08-19 LAB — GLUCOSE, CAPILLARY
GLUCOSE-CAPILLARY: 254 mg/dL — AB (ref 65–99)
GLUCOSE-CAPILLARY: 262 mg/dL — AB (ref 65–99)
Glucose-Capillary: 258 mg/dL — ABNORMAL HIGH (ref 65–99)

## 2016-08-19 LAB — ECHOCARDIOGRAM COMPLETE
CHL CUP DOP CALC LVOT VTI: 28.5 cm
CHL CUP RV SYS PRESS: 76 mmHg
E/e' ratio: 5.53
EWDT: 204 ms
FS: 38 % (ref 28–44)
IVS/LV PW RATIO, ED: 0.98
LA vol A4C: 112 ml
LA vol index: 53.8 mL/m2
LADIAMINDEX: 2.32 cm/m2
LASIZE: 50 mm
LAVOL: 116 mL
LDCA: 2.54 cm2
LEFT ATRIUM END SYS DIAM: 50 mm
LV E/e' medial: 5.53
LV SIMPSON'S DISK: 68
LV TDI E'LATERAL: 17
LV dias vol: 60 mL (ref 46–106)
LV sys vol index: 9 mL/m2
LV sys vol: 19 mL
LVDIAVOLIN: 28 mL/m2
LVEEAVG: 5.53
LVELAT: 17 cm/s
LVOT peak grad rest: 8 mmHg
LVOT peak vel: 142 cm/s
LVOTD: 18 mm
LVOTSV: 72 mL
MV Dec: 204
MV pk E vel: 94 m/s
MVPG: 4 mmHg
PW: 12.3 mm — AB (ref 0.6–1.1)
RV LATERAL S' VELOCITY: 13.5 cm/s
Reg peak vel: 392 cm/s
Stroke v: 41 ml
TAPSE: 25.4 mm
TDI e' medial: 7.29
TR max vel: 392 cm/s

## 2016-08-19 LAB — URINALYSIS, ROUTINE W REFLEX MICROSCOPIC
BILIRUBIN URINE: NEGATIVE
Glucose, UA: NEGATIVE mg/dL
Hgb urine dipstick: NEGATIVE
KETONES UR: NEGATIVE mg/dL
Nitrite: NEGATIVE
PROTEIN: NEGATIVE mg/dL
Specific Gravity, Urine: 1.009 (ref 1.005–1.030)
pH: 5 (ref 5.0–8.0)

## 2016-08-19 LAB — PROTIME-INR
INR: 3.16
PROTHROMBIN TIME: 33.2 s — AB (ref 11.4–15.2)

## 2016-08-19 LAB — TROPONIN I
TROPONIN I: 0.06 ng/mL — AB (ref <0.03)
TROPONIN I: 0.1 ng/mL — AB (ref <0.03)
TROPONIN I: 0.13 ng/mL — AB (ref <0.03)

## 2016-08-19 LAB — CBG MONITORING, ED
GLUCOSE-CAPILLARY: 211 mg/dL — AB (ref 65–99)
Glucose-Capillary: 149 mg/dL — ABNORMAL HIGH (ref 65–99)
Glucose-Capillary: 173 mg/dL — ABNORMAL HIGH (ref 65–99)

## 2016-08-19 LAB — MRSA PCR SCREENING: MRSA BY PCR: NEGATIVE

## 2016-08-19 MED ORDER — ONDANSETRON HCL 4 MG/2ML IJ SOLN: 4.0000 mg | Freq: Four times a day (QID) | INTRAMUSCULAR | Status: DC | PRN

## 2016-08-19 MED ORDER — NITROGLYCERIN 2 % TD OINT
0.5000 [in_us] | TOPICAL_OINTMENT | Freq: Four times a day (QID) | TRANSDERMAL | Status: DC
  Administered 2016-08-19 – 2016-08-25 (×25): 0.5 [in_us] via TOPICAL
  Filled 2016-08-19 (×27): qty 1

## 2016-08-19 MED ORDER — SODIUM CHLORIDE 0.9 % IV SOLN: 250.0000 mL | INTRAVENOUS | Status: DC | PRN

## 2016-08-19 MED ORDER — FUROSEMIDE 10 MG/ML IJ SOLN
60.0000 mg | Freq: Three times a day (TID) | INTRAMUSCULAR | Status: DC
  Administered 2016-08-19 – 2016-08-21 (×5): 60 mg via INTRAVENOUS
  Filled 2016-08-19 (×5): qty 6

## 2016-08-19 MED ORDER — PANTOPRAZOLE SODIUM 40 MG PO TBEC
40.0000 mg | DELAYED_RELEASE_TABLET | Freq: Every day | ORAL | Status: DC
  Administered 2016-08-19 – 2016-09-02 (×15): 40 mg via ORAL
  Filled 2016-08-19 (×15): qty 1

## 2016-08-19 MED ORDER — INSULIN ASPART 100 UNIT/ML ~~LOC~~ SOLN
SUBCUTANEOUS | Status: AC
  Filled 2016-08-19: qty 1

## 2016-08-19 MED ORDER — INSULIN DETEMIR 100 UNIT/ML ~~LOC~~ SOLN
10.0000 [IU] | Freq: Every day | SUBCUTANEOUS | Status: DC
  Administered 2016-08-20 – 2016-08-21 (×2): 10 [IU] via SUBCUTANEOUS
  Filled 2016-08-19 (×2): qty 0.1

## 2016-08-19 MED ORDER — AMLODIPINE BESYLATE 5 MG PO TABS
2.5000 mg | ORAL_TABLET | Freq: Every day | ORAL | Status: DC
  Administered 2016-08-19 – 2016-08-26 (×8): 2.5 mg via ORAL
  Filled 2016-08-19 (×8): qty 1

## 2016-08-19 MED ORDER — METOPROLOL TARTRATE 25 MG PO TABS: 12.5000 mg | ORAL_TABLET | Freq: Two times a day (BID) | ORAL | Status: DC

## 2016-08-19 MED ORDER — SODIUM CHLORIDE 0.9% FLUSH
3.0000 mL | Freq: Two times a day (BID) | INTRAVENOUS | Status: DC
  Administered 2016-08-19 – 2016-09-01 (×26): 3 mL via INTRAVENOUS

## 2016-08-19 MED ORDER — ALBUTEROL SULFATE (2.5 MG/3ML) 0.083% IN NEBU
2.5000 mg | INHALATION_SOLUTION | RESPIRATORY_TRACT | Status: DC | PRN
  Administered 2016-08-19 – 2016-08-29 (×2): 2.5 mg via RESPIRATORY_TRACT
  Filled 2016-08-19 (×2): qty 3

## 2016-08-19 MED ORDER — ACETAMINOPHEN 325 MG PO TABS
650.0000 mg | ORAL_TABLET | ORAL | Status: DC | PRN
  Administered 2016-08-19 – 2016-09-02 (×8): 650 mg via ORAL
  Filled 2016-08-19 (×9): qty 2

## 2016-08-19 MED ORDER — FUROSEMIDE 10 MG/ML IJ SOLN: 60.0000 mg | Freq: Three times a day (TID) | INTRAMUSCULAR | Status: DC

## 2016-08-19 MED ORDER — ISOSORBIDE MONONITRATE ER 30 MG PO TB24
60.0000 mg | ORAL_TABLET | Freq: Every day | ORAL | Status: DC
  Administered 2016-08-19 – 2016-08-24 (×6): 60 mg via ORAL
  Filled 2016-08-19 (×9): qty 2

## 2016-08-19 MED ORDER — ATORVASTATIN CALCIUM 40 MG PO TABS
40.0000 mg | ORAL_TABLET | Freq: Every day | ORAL | Status: DC
  Administered 2016-08-19 – 2016-09-01 (×14): 40 mg via ORAL
  Filled 2016-08-19 (×17): qty 1

## 2016-08-19 MED ORDER — HYDRALAZINE HCL 25 MG PO TABS
100.0000 mg | ORAL_TABLET | Freq: Three times a day (TID) | ORAL | Status: DC
  Administered 2016-08-19 – 2016-09-02 (×43): 100 mg via ORAL
  Filled 2016-08-19 (×43): qty 4

## 2016-08-19 MED ORDER — METHYLPREDNISOLONE SODIUM SUCC 125 MG IJ SOLR
60.0000 mg | Freq: Two times a day (BID) | INTRAMUSCULAR | Status: DC
  Administered 2016-08-19 – 2016-08-23 (×9): 60 mg via INTRAVENOUS
  Filled 2016-08-19 (×9): qty 2

## 2016-08-19 MED ORDER — INSULIN ASPART 100 UNIT/ML ~~LOC~~ SOLN
0.0000 [IU] | SUBCUTANEOUS | Status: DC
  Administered 2016-08-19: 5 [IU] via SUBCUTANEOUS
  Administered 2016-08-19: 8 [IU] via SUBCUTANEOUS
  Administered 2016-08-19: 2 [IU] via SUBCUTANEOUS
  Administered 2016-08-19: 8 [IU] via SUBCUTANEOUS
  Administered 2016-08-20: 3 [IU] via SUBCUTANEOUS
  Administered 2016-08-20: 8 [IU] via SUBCUTANEOUS
  Administered 2016-08-20: 11 [IU] via SUBCUTANEOUS
  Administered 2016-08-20: 2 [IU] via SUBCUTANEOUS
  Administered 2016-08-20: 8 [IU] via SUBCUTANEOUS
  Administered 2016-08-21: 3 [IU] via SUBCUTANEOUS
  Administered 2016-08-21: 5 [IU] via SUBCUTANEOUS
  Administered 2016-08-21: 8 [IU] via SUBCUTANEOUS
  Administered 2016-08-21 (×2): 3 [IU] via SUBCUTANEOUS
  Administered 2016-08-22: 8 [IU] via SUBCUTANEOUS
  Administered 2016-08-22: 3 [IU] via SUBCUTANEOUS
  Administered 2016-08-22 (×2): 5 [IU] via SUBCUTANEOUS
  Administered 2016-08-22: 11 [IU] via SUBCUTANEOUS
  Administered 2016-08-22: 3 [IU] via SUBCUTANEOUS
  Administered 2016-08-23: 8 [IU] via SUBCUTANEOUS
  Administered 2016-08-23: 3 [IU] via SUBCUTANEOUS
  Administered 2016-08-23: 5 [IU] via SUBCUTANEOUS
  Administered 2016-08-23: 3 [IU] via SUBCUTANEOUS
  Filled 2016-08-19: qty 1

## 2016-08-19 MED ORDER — CLOPIDOGREL BISULFATE 75 MG PO TABS
75.0000 mg | ORAL_TABLET | Freq: Every day | ORAL | Status: DC
  Administered 2016-08-19 – 2016-09-02 (×15): 75 mg via ORAL
  Filled 2016-08-19 (×15): qty 1

## 2016-08-19 MED ORDER — WARFARIN SODIUM 5 MG PO TABS: 2.5000 mg | ORAL_TABLET | ORAL | Status: DC

## 2016-08-19 MED ORDER — SODIUM CHLORIDE 0.9% FLUSH
3.0000 mL | INTRAVENOUS | Status: DC | PRN
  Administered 2016-08-21: 3 mL via INTRAVENOUS
  Filled 2016-08-19: qty 3

## 2016-08-19 MED ORDER — MORPHINE SULFATE (PF) 2 MG/ML IV SOLN
2.0000 mg | INTRAVENOUS | Status: DC | PRN
  Administered 2016-08-19 – 2016-08-30 (×22): 2 mg via INTRAVENOUS
  Filled 2016-08-19 (×22): qty 1

## 2016-08-19 MED ORDER — LATANOPROST 0.005 % OP SOLN
1.0000 [drp] | Freq: Every day | OPHTHALMIC | Status: DC
  Administered 2016-08-20 – 2016-09-01 (×14): 1 [drp] via OPHTHALMIC
  Filled 2016-08-19 (×2): qty 2.5

## 2016-08-19 MED ORDER — MORPHINE SULFATE (PF) 2 MG/ML IV SOLN
INTRAVENOUS | Status: AC
  Filled 2016-08-19: qty 1

## 2016-08-19 MED ORDER — FUROSEMIDE 10 MG/ML IJ SOLN
60.0000 mg | Freq: Four times a day (QID) | INTRAMUSCULAR | Status: DC
  Administered 2016-08-19 (×2): 60 mg via INTRAVENOUS
  Filled 2016-08-19: qty 6

## 2016-08-19 MED ORDER — WARFARIN - PHARMACIST DOSING INPATIENT: Status: DC

## 2016-08-19 MED ORDER — GLIMEPIRIDE 2 MG PO TABS
2.0000 mg | ORAL_TABLET | Freq: Every day | ORAL | Status: DC
  Administered 2016-08-19 – 2016-08-25 (×7): 2 mg via ORAL
  Filled 2016-08-19 (×10): qty 1

## 2016-08-19 MED ORDER — FUROSEMIDE 10 MG/ML IJ SOLN
INTRAMUSCULAR | Status: AC
  Filled 2016-08-19: qty 4

## 2016-08-19 MED ORDER — METOPROLOL TARTRATE 5 MG/5ML IV SOLN
INTRAVENOUS | Status: AC
  Filled 2016-08-19: qty 5

## 2016-08-19 MED ORDER — ALPRAZOLAM 0.25 MG PO TABS
0.2500 mg | ORAL_TABLET | Freq: Three times a day (TID) | ORAL | Status: DC | PRN
  Administered 2016-08-19 – 2016-09-02 (×11): 0.25 mg via ORAL
  Filled 2016-08-19 (×11): qty 1

## 2016-08-19 MED ORDER — IPRATROPIUM-ALBUTEROL 0.5-2.5 (3) MG/3ML IN SOLN
3.0000 mL | Freq: Four times a day (QID) | RESPIRATORY_TRACT | Status: DC
  Administered 2016-08-19 – 2016-08-20 (×6): 3 mL via RESPIRATORY_TRACT
  Filled 2016-08-19 (×7): qty 3

## 2016-08-19 MED ORDER — WARFARIN SODIUM 5 MG PO TABS: 5.0000 mg | ORAL_TABLET | ORAL | Status: DC

## 2016-08-19 MED ORDER — FUROSEMIDE 10 MG/ML IJ SOLN
INTRAMUSCULAR | Status: AC
  Filled 2016-08-19: qty 2

## 2016-08-19 MED ORDER — METOPROLOL TARTRATE 5 MG/5ML IV SOLN
2.5000 mg | INTRAVENOUS | Status: DC | PRN
  Administered 2016-08-19: 2.5 mg via INTRAVENOUS

## 2016-08-19 NOTE — Consult Note (Signed)
Cardiology Consultation:   Patient ID: Melissa [name]; [id number]; [birth year]   Admit date: 08/18/2016 Date of Consult: 08/19/2016  Primary Care Provider: Kari Baars, MD Primary Cardiologist:[place]    Patient Profile:   Melissa [name] is a 82 y.o. female with a hx of Chronic diastolic heart failure, chronic atrial fibrillation on coumadin, coronary artery disease with high risk DES intervention to the circumflex in 11/2015, chronic kidney disease stage III who is being seen today for the evaluation of chest pain and dyspnea at the request of Dr. Juanetta Gosling  History of Present Illness:   Melissa [name] presented to the emergency room with complaints of chest tightness and dyspnea. The patient was actually seen in our office by Dr. Diona Browner on 08/16/2016 and was found to have fluid overload and decompensation. The patient's Demadex was dull to 40 mg twice a day. At that time her weight was 217 pounds. The patient did go up on her dose of Demadex, but continued to have some worsening dyspnea, with ongoing lower extremity edema and therefore presented to the emergency room. Weight has not been taken in the emergency room due to her dyspnea and chest discomfort.  On arrival to the emergency zero the patient was found to be hypertensive with a blood pressure 177/130, heart rate 84, O2 sat 96%, she was afebrile. She was found to be hyperglycemic with a glucose 173. INR 3.16, PTT 33.2. Sodium was 133, chloride 98, creatinine 2.27. She was found to be anemic with a hemoglobin of 8.3, hematocrit 25.1, no leukocytosis. BNP 743.0. EKG revealed atrial fib flutter rate in the 121 bpm. Chest x-ray revealed cardiomegaly with interval increase in the size of silhouette, mild vascular congestion without pulmonary edema or CHF.  The patient was treated with Lasix 40 mg IV 1, followed by Lasix 60 mg IV 1. She was given Ativan, prednisone IV, albuterol treatments, and nitroglycerin 1/2 inch topically. She  was also given IV metoprolol 2.5 mg in the setting of A. fib RVR The patient continues to have complaints of chest discomfort and dyspnea with mild PND. She is being treated with morphine.   Past Medical History:  Diagnosis Date  . Atrial fibrillation (HCC)   . CAD (coronary artery disease)    Multivessel status post high risk PCI/DES to circumflex 11/2015 (poor candidate for CABG)  . CHF (congestive heart failure) (HCC)   . CKD (chronic kidney disease) stage 3, GFR 30-59 ml/min   . DDD (degenerative disc disease), lumbar   . Essential hypertension, benign   . Mixed hyperlipidemia   . Type 2 diabetes mellitus (HCC)     Past Surgical History:  Procedure Laterality Date  . CARDIAC CATHETERIZATION N/A 11/26/2015   Procedure: Right/Left Heart Cath and Coronary Angiography;  Surgeon: Yvonne Kendall, MD;  Location: [facility] INVASIVE CV LAB;  Service: Cardiovascular;  Laterality: N/A;  . CARDIAC CATHETERIZATION N/A 12/01/2015   Procedure: Coronary Stent Intervention Rotoblader;  Surgeon: Tonny Bollman, MD;  Location: [facility] INVASIVE CV LAB;  Service: Cardiovascular;  Laterality: N/A;  . TOTAL HIP ARTHROPLASTY  01/14/03   Left     Inpatient Medications: Scheduled Meds: . amLODipine  2.5 mg Oral Daily  . atorvastatin  40 mg Oral Daily  . clopidogrel  75 mg Oral Daily  . furosemide      . furosemide      . furosemide  60 mg Intravenous Q6H  . glimepiride  2 mg Oral Daily  . hydrALAZINE  100 mg Oral TID  .  insulin aspart  0-15 Units Subcutaneous Q4H  . [START ON 08/20/2016] insulin detemir  10 Units Subcutaneous QHS  . ipratropium-albuterol  3 mL Nebulization Q6H  . isosorbide mononitrate  60 mg Oral Daily  . latanoprost  1 drop Both Eyes QHS  . methylPREDNISolone (SOLU-MEDROL) injection  60 mg Intravenous Q12H  . nitroGLYCERIN  0.5 inch Topical Q6H  . pantoprazole  40 mg Oral Q0600  . sodium chloride flush  3 mL Intravenous Q12H  . [START ON 08/20/2016] warfarin  2.5 mg Oral Once per day on  Wed Sat  . warfarin  5 mg Oral Once per day on Sun Mon Tue Thu Fri  . Warfarin - Pharmacist Dosing Inpatient   Does not apply Q24H   Continuous Infusions: . sodium chloride     PRN Meds: sodium chloride, acetaminophen, albuterol, ALPRAZolam, ondansetron (ZOFRAN) IV, sodium chloride flush  Allergies:   No Known Allergies  Social History:   Social History   Social History  . Marital status: Widowed    Spouse name: N/A  . Number of children: N/A  . Years of education: N/A   Occupational History  . Retired Retired   Social History Main Topics  . Smoking status: Never Smoker  . Smokeless tobacco: Never Used  . Alcohol use No  . Drug use: No  . Sexual activity: No   Other Topics Concern  . Not on file   Social History Narrative  . No narrative on file    Family History:   The patient's family history includes Diabetes type II in her father and mother; Hypertension in her father.  ROS:  Please see the history of present illness.  ROS All other ROS reviewed and negative.     Physical Exam/Data:   Vitals:   08/19/16 0748 08/19/16 0749 08/19/16 0825 08/19/16 0830  BP: (!) 158/77   (!) 158/77  Pulse:  88  89  Resp:    20  SpO2:  100% 94% 100%    Intake/Output Summary (Last 24 hours) at 08/19/16 0925 Last data filed at 08/19/16 0445  Gross per 24 hour  Intake                0 ml  Output              975 ml  Net             -975 ml   There were no vitals filed for this visit. There is no height or weight on file to calculate BMI.  General:  Well nourished, well developed, Dyspnea, with mild orthopnea HEENT: normal Lymph: no adenopathy Neck: Prominent JVD, at 45 and 90 Endocrine:  No thryomegaly Vascular: No carotid bruits; FA pulses 2+ bilaterally without bruits  Cardiac:  normal S1, S2; IRRR; 2/6 systolic murmur Lungs:  Essentially clear to auscultation, diminished in the left base, no wheezes or rhonchi no coughing Abd: soft, nontender, no hepatomegaly   Ext: no edema Musculoskeletal:  No deformities, BUE and BLE strength normal and equal Skin: warm and dry  Neuro:  CNs 2-12 intact, no focal abnormalities noted Psych:  Anxious  EKG:  The EKG was personally reviewed and demonstrates: Atrial fib, heart rate 121 bpm.  Telemetry:  Telemetry was personally reviewed and demonstrates: Atrial fibrillation heart rate in the 100 bpm  Relevant CV Studies: Echocardiogram 11/11/2015 Left ventricle: The cavity size was normal. Wall thickness was   increased in a pattern of mild LVH. Systolic function was normal.  The estimated ejection fraction was in the range of 60% to 65%.   There is akinesis of the mid-apicalanteroseptal and apical   myocardium. The study is not technically sufficient to allow   evaluation of LV diastolic function. - Aortic valve: Mildly calcified annulus. Trileaflet; mildly   thickened leaflets. - Mitral valve: Calcified annulus. There was trivial regurgitation. - Left atrium: The atrium was severely dilated. - Right atrium: The atrium was severely dilated. Central venous   pressure (est): 8 mm Hg. - Tricuspid valve: There was moderate regurgitation. - Pulmonary arteries: Systolic pressure was severely increased. PA   peak pressure: 84 mm Hg (S). - Pericardium, extracardiac: A trivial pericardial effusion was   identified posterior to the heart.  Cardiac Cath Conclusion 11/26/2015  Conclusions: 1.  Severe, heavily calcified three-vessel coronary artery disease (as described below), including chronic total occlusion of the mid LAD, 90% proximal LCx stenosis, and 90% ostial RCA lesion with moderate diffuse disease throughout the remainder of the RCA. 2.  Normal left and right heart filling pressures. 3.  Mild to moderate pulmonary hypertension. 4.  Normal cardiac output/index. 5.  Tortuous and calcified thoracic and abdominal aorta.   PCI 12/01/2015 Conclusion   Successful PCI of critical proximal LCx stenosis using  rotational atherectomy and a 3.5x12 mm Synergy DES. Procedure complicated by a non-flow limiting distal wire dissection, patient CP-free and hemodynamically stable at procedure completion.  Recommendations:  Medical management of residual CAD, unless recurrent angina  If recurrent angina, PCI of the ostial RCA  Continue ASA and plavix. Start warfarin tonight without heparin bridging\  If PCI of the RCA required, might consider radial access as she has severe aorto-iliac tortuosity requiring placement of a long 8 Fr sheath.    Laboratory Data:  Chemistry Recent Labs Lab 08/18/16 2210  NA 133*  K 5.1  CL 98*  CO2 24  GLUCOSE 106*  BUN 70*  CREATININE 2.27*  CALCIUM 8.6*  GFRNONAA 19*  GFRAA 22*  ANIONGAP 11     Recent Labs Lab 08/18/16 2210  PROT 6.5  ALBUMIN 3.2*  AST 59*  ALT 35  ALKPHOS 144*  BILITOT 1.0   Hematology Recent Labs Lab 08/18/16 2210  WBC 7.0  RBC 2.60*  HGB 8.3*  HCT 25.1*  MCV 96.5  MCH 31.9  MCHC 33.1  RDW 15.9*  PLT 252   Cardiac EnzymesNo results for input(s): TROPONINI in the last 168 hours.  Recent Labs Lab 08/18/16 2227  TROPIPOC 0.03    BNP Recent Labs Lab 08/18/16 2210  BNP 743.0*    DDimer No results for input(s): DDIMER in the last 168 hours.  Radiology/Studies:  Dg Chest Portable 1 View  Result Date: 08/18/2016 CLINICAL DATA:  83 year old female with shortness of breath. EXAM: PORTABLE CHEST 1 VIEW COMPARISON:  Chest radiograph dated 07/20/2016 FINDINGS: There is moderate cardiomegaly with interval increase in the size of the cardiopericardial silhouette compared to the prior study. A pericardial effusion is not excluded. There is mild central vascular and interstitial prominence. Bilateral lower lung field platelike atelectasis or scarring noted. No focal consolidation, pleural effusion, or pneumothorax. There is atherosclerotic calcification of the thoracic aorta. No acute osseous pathology. IMPRESSION: 1.  Moderate cardiomegaly with interval increase in the size of the cardiac silhouette. A pericardial effusion is not excluded. 2. Mild vascular congestion.  No focal consolidation. Electronically Signed   By: Elgie Collard M.D.   On: 08/18/2016 21:38    Assessment and Plan:   1. Acute  on chronic diastolic CHF: Continues to have evidence of fluid overload with lower extremity edema. Has been given IV Lasix and has begun to diurese, some improvement in symptoms. Patient denies medical noncompliance or use of salt at home. Just saw cardiology 3 days ago with increased dose of torsemide. She states that it helped some. We'll continue IV diuresis.  2. Chest pain: Known history of three-vessel CAD, patient had drug-eluting stent left circumflex, with residual disease in the right coronary artery that could be possibly treated with intervention per catheterization noted in 2017. She had a 90% ostial RCA. The patient also has CTO of the LAD. She is currently being treated medically but now is having recurrent chest pain. Chest x-ray is negative for CHF although she does have fluid retention noted in the setting of diastolic heart failure. Troponin currently 0.03.  Creatinine and INR would prohibit cardiac cath at this time. Best if treated at [facility] for now until stabilized or significant elevation in Troponin.Dr. Wyline Mood will see her for final evaluation. I have discussed this with Dr. Juanetta Gosling.  3. Atrial flutter with RVR: Patient has supratherapeutic INR of 3.13, this along with elevated creatinine would prohibit cardiac catheterization today. She is not on any AV nodal blocking agents. Has been given one dose of IV metoprolol in the ER for heart rate control. We'll have pharmacy dose Coumadin. We'll likely hold today's dose until INR is more therapeutic.   4. Anemia: Likely from CKD. Down one gm from 07/26/2016. Follow   5. Pulmonary Hypertension: Noted on Echocardiogram in 2017 Will repeat this admission.    Signed, Joni Reining DNP, ANP-C  08/19/2016 9:25 AM   Attending note Patient seen and discussed with DNP Lyman Bishop, I agree with her documentation above. 83 yo female history of DM2, bradycardia on beta blockers, afib, CAD with prior DES to LCX 11/2015 (poor cadidate for CABG at the time), CKD 3, HTN, HL,chronic diastolic HF admitted with SOB and edema.   Heart cath done 11/26/15 showed severe 3V CAD with total mid LAD and 90% proximal CFX and RCA. She was seen in consult by Dr Laneta Simmers for consideration of CABG. He was concerned about Ca++ in the aorta on her angiogram and a CT w/o contrast was ordered. This revealed extensive calcific disease in the aorta and he felt she would not be a candidate for CABG. On 12/01/15 she underwent CFX PCI with HSRA and DES. This was complicated by distal vessel dissection and post PCI anemia requiring transfusion. Plans were for medical therapy of residual CAD unless recurrent angina could consider PCI of RCA.   Presents with several day history of progressive LE edema and SOB. This morning developed moderate to severe chest heaviness in midchest, lasted for about 1 hour. Not positional. Upon our interview has completely resolved. In Er she has received IV lasix, nebs, solumedrol, morphine, and NG patch.   ER vitals: 177/130 96% p 84 11/2015 echo: LVEF 60-65%, akinesis mid-apicalanterosetpal and apical wealls. Moderate TR, PASP 84.  2017 RHC: mean PA 33, PCWP 16 K 5.1 Cr 2.27 (baseline 1.6-2.3), Hgb 8.3 (down from 9.9), INR 3, BNP 743,  Trop neg x 1 CXR moderate cardiomegaly, cannot exclude pericardial effusion, mild edema EKG afib, chronic inferior and lateral precordial ST/T changes  Patient presents with acute on chronic diastolic HF. Her historical weights are very labile and appear inaccurate. CXR with some edema, elevated BNP, severe LE edema on exam. She will require admission for aggressive IV diuresis, would start  with lasix IV 60mg  tid.Follow renal  function closesly. Chest pressure has resolved, no objective evidence of ischemia at this time. May be related to volume overload. Cycle EKG and enzymes, at this time there is no indication for cath. Would hold coumadin and treat with heparin when INR is subtherapeutic in case invasive procedure is needed this admission. At this time I feel she is reasonable for management in stepdown unit at [facility].  Anemia per primary team. CKD with fairly labile Cr's over the last several months, may actually improve with diuresis in setting of diastolic HF and venous congestion. F/u repeat echo. Some elevated heart rates with her afib I suspect due to her level of distress, she has historically had bradycardia on beta blockers before. We will manage with just prn IV lopressor at this time, rates should improve as her breathing and CHF improves. Severely elevated PASP by last echo, intesteringly only mean PA of 33 by RHC with PWCP of 16 consistent with mixed pre and postcapillary pulm HTN.    Dina Rich MD

## 2016-08-19 NOTE — ED Notes (Signed)
Pt stating she is about to starve.  Called Dr. [name] for diet orders

## 2016-08-19 NOTE — ED Notes (Signed)
Pt calm at this time. No distress.  Denies any chest pain.

## 2016-08-19 NOTE — ED Notes (Signed)
Spoke with Dr. [name] on telephone and updated on pt condition.  Orders received.

## 2016-08-19 NOTE — ED Notes (Signed)
Pt resting with eyes shut.  Easily arouses.  No distress.  Denies any pain.

## 2016-08-19 NOTE — ED Notes (Signed)
Pt calling out.  Pt moaning out loud and holding chest.  States her chest is hurting.  Repeating EKG and vitals.

## 2016-08-19 NOTE — Progress Notes (Signed)
ANTICOAGULATION CONSULT NOTE - Initial Consult  Pharmacy Consult for COUMADIN (home med) Indication: atrial fibrillation  No Known Allergies  Patient Measurements:    Vital Signs: BP: 158/77 (07/13 0830) Pulse Rate: 89 (07/13 0830)  Labs:  Recent Labs  08/18/16 2210 08/19/16 0215  HGB 8.3*  --   HCT 25.1*  --   PLT 252  --   LABPROT  --  33.2*  INR  --  3.16  CREATININE 2.27*  --     Estimated Creatinine Clearance: 21.4 mL/min (A) (by C-G formula based on SCr of 2.27 mg/dL (H)).  Medical History: Past Medical History:  Diagnosis Date  . Atrial fibrillation (HCC)   . CAD (coronary artery disease)    Multivessel status post high risk PCI/DES to circumflex 11/2015 (poor candidate for CABG)  . CHF (congestive heart failure) (HCC)   . CKD (chronic kidney disease) stage 3, GFR 30-59 ml/min   . DDD (degenerative disc disease), lumbar   . Essential hypertension, benign   . Mixed hyperlipidemia   . Type 2 diabetes mellitus (HCC)    Medications:   (Not in a hospital admission)  Assessment: 83yo female on chronic Coumadin PTA for h/o afib.  INR slightly above goal on admission.  Home dose listed above.   Goal of Therapy:  INR 2-3 Monitor platelets by anticoagulation protocol: Yes   Plan:  Coumadin 5mg  today x 1 (home dose) INR daily  Melissa [name], Melissa [name] A 08/19/2016,9:12 AM

## 2016-08-19 NOTE — ED Notes (Signed)
Pt resting quietly.

## 2016-08-19 NOTE — Progress Notes (Signed)
Subjective: She was admitted with acute on chronic heart failure. She had increased her dose of diuretics earlier this week but continued to have shortness of breath. She says she feels a little bit better now. She has no other new complaints. No chest pain. She is very hard of hearing which makes the assessment more difficult. Her daughter provides some of the history. She's been having swelling for about a month.  Objective: Vital signs in last 24 hours: Pulse Rate:  [84-110] 89 (07/13 0830) Resp:  [13-36] 20 (07/13 0830) BP: (125-170)/(60-94) 158/77 (07/13 0830) SpO2:  [94 %-100 %] 100 % (07/13 0830) Weight change:     Intake/Output from previous day: 07/12 0701 - 07/13 0700 In: -  Out: 975 [Urine:975]  PHYSICAL EXAM General appearance: alert, cooperative and no distress Resp: Minimal by basilar rales Cardio: irregularly irregular rhythm GI: soft, non-tender; bowel sounds normal; no masses,  no organomegaly Extremities: At least 2+ edema bilaterally all the way up into her thighs Very hard of hearing  Lab Results:  Results for orders placed or performed during the hospital encounter of 08/18/16 (from the past 48 hour(s))  Urinalysis, Routine w reflex microscopic     Status: Abnormal   Collection Time: 08/18/16  9:23 PM  Result Value Ref Range   Color, Urine YELLOW YELLOW   APPearance CLOUDY (A) CLEAR   Specific Gravity, Urine 1.009 1.005 - 1.030   pH 5.0 5.0 - 8.0   Glucose, UA NEGATIVE NEGATIVE mg/dL   Hgb urine dipstick NEGATIVE NEGATIVE   Bilirubin Urine NEGATIVE NEGATIVE   Ketones, ur NEGATIVE NEGATIVE mg/dL   Protein, ur NEGATIVE NEGATIVE mg/dL   Nitrite NEGATIVE NEGATIVE   Leukocytes, UA LARGE (A) NEGATIVE   RBC / HPF 0-5 0 - 5 RBC/hpf   WBC, UA TOO NUMEROUS TO COUNT 0 - 5 WBC/hpf   Bacteria, UA RARE (A) NONE SEEN   Squamous Epithelial / LPF 0-5 (A) NONE SEEN   WBC Clumps PRESENT    Mucous PRESENT   Basic metabolic panel     Status: Abnormal   Collection  Time: 08/18/16 10:10 PM  Result Value Ref Range   Sodium 133 (L) 135 - 145 mmol/L   Potassium 5.1 3.5 - 5.1 mmol/L   Chloride 98 (L) 101 - 111 mmol/L   CO2 24 22 - 32 mmol/L   Glucose, Bld 106 (H) 65 - 99 mg/dL   BUN 70 (H) 6 - 20 mg/dL   Creatinine, Ser 2.27 (H) 0.44 - 1.00 mg/dL   Calcium 8.6 (L) 8.9 - 10.3 mg/dL   GFR calc non Af Amer 19 (L) >60 mL/min   GFR calc Af Amer 22 (L) >60 mL/min    Comment: (NOTE) The eGFR has been calculated using the CKD EPI equation. This calculation has not been validated in all clinical situations. eGFR's persistently <60 mL/min signify possible Chronic Kidney Disease.    Anion gap 11 5 - 15  CBC     Status: Abnormal   Collection Time: 08/18/16 10:10 PM  Result Value Ref Range   WBC 7.0 4.0 - 10.5 K/uL   RBC 2.60 (L) 3.87 - 5.11 MIL/uL   Hemoglobin 8.3 (L) 12.0 - 15.0 g/dL   HCT 25.1 (L) 36.0 - 46.0 %   MCV 96.5 78.0 - 100.0 fL   MCH 31.9 26.0 - 34.0 pg   MCHC 33.1 30.0 - 36.0 g/dL   RDW 15.9 (H) 11.5 - 15.5 %   Platelets 252 150 - 400  K/uL  Hepatic function panel     Status: Abnormal   Collection Time: 08/18/16 10:10 PM  Result Value Ref Range   Total Protein 6.5 6.5 - 8.1 g/dL   Albumin 3.2 (L) 3.5 - 5.0 g/dL   AST 59 (H) 15 - 41 U/L   ALT 35 14 - 54 U/L   Alkaline Phosphatase 144 (H) 38 - 126 U/L   Total Bilirubin 1.0 0.3 - 1.2 mg/dL   Bilirubin, Direct 0.2 0.1 - 0.5 mg/dL   Indirect Bilirubin 0.8 0.3 - 0.9 mg/dL  Brain natriuretic peptide     Status: Abnormal   Collection Time: 08/18/16 10:10 PM  Result Value Ref Range   B Natriuretic Peptide 743.0 (H) 0.0 - 100.0 pg/mL  I-stat troponin, ED     Status: None   Collection Time: 08/18/16 10:27 PM  Result Value Ref Range   Troponin i, poc 0.03 0.00 - 0.08 ng/mL   Comment 3            Comment: Due to the release kinetics of cTnI, a negative result within the first hours of the onset of symptoms does not rule out myocardial infarction with certainty. If myocardial infarction is  still suspected, repeat the test at appropriate intervals.   I-Stat CG4 Lactic Acid, ED     Status: None   Collection Time: 08/18/16 10:39 PM  Result Value Ref Range   Lactic Acid, Venous 0.85 0.5 - 1.9 mmol/L  CBG monitoring, ED     Status: Abnormal   Collection Time: 08/18/16 10:55 PM  Result Value Ref Range   Glucose-Capillary 108 (H) 65 - 99 mg/dL  Protime-INR     Status: Abnormal   Collection Time: 08/19/16  2:15 AM  Result Value Ref Range   Prothrombin Time 33.2 (H) 11.4 - 15.2 seconds   INR 3.16   CBG monitoring, ED     Status: Abnormal   Collection Time: 08/19/16  4:27 AM  Result Value Ref Range   Glucose-Capillary 149 (H) 65 - 99 mg/dL  CBG monitoring, ED     Status: Abnormal   Collection Time: 08/19/16  8:29 AM  Result Value Ref Range   Glucose-Capillary 173 (H) 65 - 99 mg/dL    ABGS No results for input(s): PHART, PO2ART, TCO2, HCO3 in the last 72 hours.  Invalid input(s): PCO2 CULTURES No results found for this or any previous visit (from the past 240 hour(s)). Studies/Results: Dg Chest Portable 1 View  Result Date: 08/18/2016 CLINICAL DATA:  83 year old female with shortness of breath. EXAM: PORTABLE CHEST 1 VIEW COMPARISON:  Chest radiograph dated 07/20/2016 FINDINGS: There is moderate cardiomegaly with interval increase in the size of the cardiopericardial silhouette compared to the prior study. A pericardial effusion is not excluded. There is mild central vascular and interstitial prominence. Bilateral lower lung field platelike atelectasis or scarring noted. No focal consolidation, pleural effusion, or pneumothorax. There is atherosclerotic calcification of the thoracic aorta. No acute osseous pathology. IMPRESSION: 1. Moderate cardiomegaly with interval increase in the size of the cardiac silhouette. A pericardial effusion is not excluded. 2. Mild vascular congestion.  No focal consolidation. Electronically Signed   By: Anner Crete M.D.   On: 08/18/2016 21:38     Medications:  Prior to Admission:  (Not in a hospital admission) Scheduled: . amLODipine  2.5 mg Oral Daily  . atorvastatin  40 mg Oral Daily  . clopidogrel  75 mg Oral Daily  . furosemide      .  furosemide      . furosemide  60 mg Intravenous Q6H  . glimepiride  2 mg Oral Daily  . hydrALAZINE  100 mg Oral TID  . insulin aspart  0-15 Units Subcutaneous Q4H  . [START ON 08/20/2016] insulin detemir  10 Units Subcutaneous QHS  . ipratropium-albuterol  3 mL Nebulization Q6H  . isosorbide mononitrate  60 mg Oral Daily  . latanoprost  1 drop Both Eyes QHS  . methylPREDNISolone (SOLU-MEDROL) injection  60 mg Intravenous Q12H  . nitroGLYCERIN  0.5 inch Topical Q6H  . pantoprazole  40 mg Oral Q0600  . sodium chloride flush  3 mL Intravenous Q12H  . [START ON 08/20/2016] warfarin  2.5 mg Oral Once per day on Wed Sat  . warfarin  5 mg Oral Once per day on Sun Mon Tue Thu Fri   Continuous: . sodium chloride     PVX:YIAXKP chloride, acetaminophen, albuterol, ALPRAZolam, ondansetron (ZOFRAN) IV, sodium chloride flush  Assesment: She has acute on chronic diastolic heart failure. At baseline she has hypertension chronic kidney disease anemia related to her chronic kidney disease coronary artery occlusive disease diabetes and persistent atrial fib. Principal Problem:   Acute on chronic diastolic CHF (congestive heart failure) (HCC) Active Problems:   Essential hypertension, benign   Persistent atrial fibrillation (HCC)   Long term current use of anticoagulant   Chronic kidney disease (CKD), stage IV (severe) (HCC)   Status post coronary artery stent placement   Diabetes mellitus type 2, controlled ([place])   Acute on chronic diastolic (congestive) heart failure ([place])    Plan: Continue diuresis. Continue other treatments. Request cardiology consultation    LOS: 0 days   Shadae Reino L 08/19/2016, 8:49 AM

## 2016-08-19 NOTE — ED Notes (Signed)
Cardiology at bedside. Pt states her pain is starting to ease up at this time.

## 2016-08-19 NOTE — Progress Notes (Signed)
*  PRELIMINARY RESULTS* Echocardiogram 2D Echocardiogram has been performed.  Stacey DrainWhite, Emaly Boschert J 08/19/2016, 2:38 PM

## 2016-08-19 NOTE — ED Notes (Signed)
Date and time results received: 08/19/16 1150  Test: Troponin Critical Value: 0.06  Name of Provider Notified: Dr. Hyacinth MeekerMiller  Orders Received? Or Actions Taken?: No new orders given

## 2016-08-19 NOTE — ED Notes (Signed)
Pt awake in room.  No distress.  Lungs diminished.

## 2016-08-20 LAB — BASIC METABOLIC PANEL
ANION GAP: 9 (ref 5–15)
BUN: 84 mg/dL — ABNORMAL HIGH (ref 6–20)
CO2: 23 mmol/L (ref 22–32)
Calcium: 8.8 mg/dL — ABNORMAL LOW (ref 8.9–10.3)
Chloride: 102 mmol/L (ref 101–111)
Creatinine, Ser: 2.41 mg/dL — ABNORMAL HIGH (ref 0.44–1.00)
GFR, EST AFRICAN AMERICAN: 20 mL/min — AB (ref >60)
GFR, EST NON AFRICAN AMERICAN: 17 mL/min — AB (ref >60)
GLUCOSE: 154 mg/dL — AB (ref 65–99)
POTASSIUM: 5.8 mmol/L — AB (ref 3.5–5.1)
Sodium: 134 mmol/L — ABNORMAL LOW (ref 135–145)

## 2016-08-20 LAB — GLUCOSE, CAPILLARY
GLUCOSE-CAPILLARY: 198 mg/dL — AB (ref 65–99)
GLUCOSE-CAPILLARY: 278 mg/dL — AB (ref 65–99)
Glucose-Capillary: 145 mg/dL — ABNORMAL HIGH (ref 65–99)
Glucose-Capillary: 269 mg/dL — ABNORMAL HIGH (ref 65–99)
Glucose-Capillary: 300 mg/dL — ABNORMAL HIGH (ref 65–99)

## 2016-08-20 LAB — CBC
HCT: 25.1 % — ABNORMAL LOW (ref 36.0–46.0)
Hemoglobin: 8.2 g/dL — ABNORMAL LOW (ref 12.0–15.0)
MCH: 31.4 pg (ref 26.0–34.0)
MCHC: 32.7 g/dL (ref 30.0–36.0)
MCV: 96.2 fL (ref 78.0–100.0)
PLATELETS: 276 10*3/uL (ref 150–400)
RBC: 2.61 MIL/uL — AB (ref 3.87–5.11)
RDW: 15.8 % — ABNORMAL HIGH (ref 11.5–15.5)
WBC: 7.3 10*3/uL (ref 4.0–10.5)

## 2016-08-20 LAB — PROTIME-INR
INR: 2.76
Prothrombin Time: 29.7 seconds — ABNORMAL HIGH (ref 11.4–15.2)

## 2016-08-20 NOTE — Progress Notes (Signed)
Subjective: She was admitted with congestive heart failure. Additionally she has chronic atrial fib, diabetes, chronic renal failure and she's very hard of hearing. She had chest pain in the emergency department which was severe at the time but responded quickly to morphine and she did not have significant increase in troponin. Cardiology has seen her and feels that even if she was having acute coronary syndrome she was not a candidate for urgent cardiac catheterization because her prothrombin time was elevated and her renal function was poor. She says she feels a little bit better. She still has significant edema.  Objective: Vital signs in last 24 hours: Temp:  [98 F (36.7 C)-98.4 F (36.9 C)] 98.2 F (36.8 C) (07/14 0800) Pulse Rate:  [84-121] 86 (07/13 1258) BP: (124-164)/(72-99) 124/72 (07/13 2208) SpO2:  [92 %-100 %] 96 % (07/14 0832) Weight:  [100 kg (220 lb 7.4 oz)-100.4 kg (221 lb 5.5 oz)] 100.4 kg (221 lb 5.5 oz) (07/14 0500) Weight change:     Intake/Output from previous day: 07/13 0701 - 07/14 0700 In: 240 [P.O.:240] Out: 1350 [Urine:1350]  PHYSICAL EXAM General appearance: alert, cooperative, mild distress and Very hard of hearing Resp: rales bibasilar Cardio: Her heart is irregular with systolic heart murmur. I don't. Gallop now. She does have significant JVD GI: Chief feels like she has some fluid in her abdomen as well Extremities: She still has pitting edema up into her thighs but it is less than yesterday Mucous membranes are moist  Lab Results:  Results for orders placed or performed during the hospital encounter of 08/18/16 (from the past 48 hour(s))  Urinalysis, Routine w reflex microscopic     Status: Abnormal   Collection Time: 08/18/16  9:23 PM  Result Value Ref Range   Color, Urine YELLOW YELLOW   APPearance CLOUDY (A) CLEAR   Specific Gravity, Urine 1.009 1.005 - 1.030   pH 5.0 5.0 - 8.0   Glucose, UA NEGATIVE NEGATIVE mg/dL   Hgb urine dipstick  NEGATIVE NEGATIVE   Bilirubin Urine NEGATIVE NEGATIVE   Ketones, ur NEGATIVE NEGATIVE mg/dL   Protein, ur NEGATIVE NEGATIVE mg/dL   Nitrite NEGATIVE NEGATIVE   Leukocytes, UA LARGE (A) NEGATIVE   RBC / HPF 0-5 0 - 5 RBC/hpf   WBC, UA TOO NUMEROUS TO COUNT 0 - 5 WBC/hpf   Bacteria, UA RARE (A) NONE SEEN   Squamous Epithelial / LPF 0-5 (A) NONE SEEN   WBC Clumps PRESENT    Mucous PRESENT   Basic metabolic panel     Status: Abnormal   Collection Time: 08/18/16 10:10 PM  Result Value Ref Range   Sodium 133 (L) 135 - 145 mmol/L   Potassium 5.1 3.5 - 5.1 mmol/L   Chloride 98 (L) 101 - 111 mmol/L   CO2 24 22 - 32 mmol/L   Glucose, Bld 106 (H) 65 - 99 mg/dL   BUN 70 (H) 6 - 20 mg/dL   Creatinine, Ser 2.27 (H) 0.44 - 1.00 mg/dL   Calcium 8.6 (L) 8.9 - 10.3 mg/dL   GFR calc non Af Amer 19 (L) >60 mL/min   GFR calc Af Amer 22 (L) >60 mL/min    Comment: (NOTE) The eGFR has been calculated using the CKD EPI equation. This calculation has not been validated in all clinical situations. eGFR's persistently <60 mL/min signify possible Chronic Kidney Disease.    Anion gap 11 5 - 15  CBC     Status: Abnormal   Collection Time: 08/18/16 10:10 PM  Result Value Ref Range   WBC 7.0 4.0 - 10.5 K/uL   RBC 2.60 (L) 3.87 - 5.11 MIL/uL   Hemoglobin 8.3 (L) 12.0 - 15.0 g/dL   HCT 25.1 (L) 36.0 - 46.0 %   MCV 96.5 78.0 - 100.0 fL   MCH 31.9 26.0 - 34.0 pg   MCHC 33.1 30.0 - 36.0 g/dL   RDW 15.9 (H) 11.5 - 15.5 %   Platelets 252 150 - 400 K/uL  Hepatic function panel     Status: Abnormal   Collection Time: 08/18/16 10:10 PM  Result Value Ref Range   Total Protein 6.5 6.5 - 8.1 g/dL   Albumin 3.2 (L) 3.5 - 5.0 g/dL   AST 59 (H) 15 - 41 U/L   ALT 35 14 - 54 U/L   Alkaline Phosphatase 144 (H) 38 - 126 U/L   Total Bilirubin 1.0 0.3 - 1.2 mg/dL   Bilirubin, Direct 0.2 0.1 - 0.5 mg/dL   Indirect Bilirubin 0.8 0.3 - 0.9 mg/dL  Brain natriuretic peptide     Status: Abnormal   Collection Time:  08/18/16 10:10 PM  Result Value Ref Range   B Natriuretic Peptide 743.0 (H) 0.0 - 100.0 pg/mL  I-stat troponin, ED     Status: None   Collection Time: 08/18/16 10:27 PM  Result Value Ref Range   Troponin i, poc 0.03 0.00 - 0.08 ng/mL   Comment 3            Comment: Due to the release kinetics of cTnI, a negative result within the first hours of the onset of symptoms does not rule out myocardial infarction with certainty. If myocardial infarction is still suspected, repeat the test at appropriate intervals.   I-Stat CG4 Lactic Acid, ED     Status: None   Collection Time: 08/18/16 10:39 PM  Result Value Ref Range   Lactic Acid, Venous 0.85 0.5 - 1.9 mmol/L  CBG monitoring, ED     Status: Abnormal   Collection Time: 08/18/16 10:55 PM  Result Value Ref Range   Glucose-Capillary 108 (H) 65 - 99 mg/dL  Protime-INR     Status: Abnormal   Collection Time: 08/19/16  2:15 AM  Result Value Ref Range   Prothrombin Time 33.2 (H) 11.4 - 15.2 seconds   INR 3.16   CBG monitoring, ED     Status: Abnormal   Collection Time: 08/19/16  4:27 AM  Result Value Ref Range   Glucose-Capillary 149 (H) 65 - 99 mg/dL  CBG monitoring, ED     Status: Abnormal   Collection Time: 08/19/16  8:29 AM  Result Value Ref Range   Glucose-Capillary 173 (H) 65 - 99 mg/dL  Troponin I (q 6hr x 3)     Status: Abnormal   Collection Time: 08/19/16 10:02 AM  Result Value Ref Range   Troponin I 0.06 (HH) <0.03 ng/mL    Comment: CRITICAL RESULT CALLED TO, READ BACK BY AND VERIFIED WITH: KENDRICK,J @ 1150 ON 7.13.18 BY BOWMAN,L   CBG monitoring, ED     Status: Abnormal   Collection Time: 08/19/16 12:47 PM  Result Value Ref Range   Glucose-Capillary 211 (H) 65 - 99 mg/dL  MRSA PCR Screening     Status: None   Collection Time: 08/19/16  3:44 PM  Result Value Ref Range   MRSA by PCR NEGATIVE NEGATIVE    Comment:        The GeneXpert MRSA Assay (FDA approved for NASAL specimens only), is one  component of  a comprehensive MRSA colonization surveillance program. It is not intended to diagnose MRSA infection nor to guide or monitor treatment for MRSA infections.   Troponin I (q 6hr x 3)     Status: Abnormal   Collection Time: 08/19/16  4:25 PM  Result Value Ref Range   Troponin I 0.10 (HH) <0.03 ng/mL    Comment: CRITICAL VALUE NOTED.  VALUE IS CONSISTENT WITH PREVIOUSLY REPORTED AND CALLED VALUE.  Glucose, capillary     Status: Abnormal   Collection Time: 08/19/16  5:42 PM  Result Value Ref Range   Glucose-Capillary 262 (H) 65 - 99 mg/dL  Glucose, capillary     Status: Abnormal   Collection Time: 08/19/16  8:00 PM  Result Value Ref Range   Glucose-Capillary 258 (H) 65 - 99 mg/dL   Comment 1 Notify RN    Comment 2 Document in Chart   Troponin I (q 6hr x 3)     Status: Abnormal   Collection Time: 08/19/16 10:46 PM  Result Value Ref Range   Troponin I 0.13 (HH) <0.03 ng/mL    Comment: CRITICAL RESULT CALLED TO, READ BACK BY AND VERIFIED WITH:  ROBERTS,T @ 2350 ON 08/19/16 BY JUW   Glucose, capillary     Status: Abnormal   Collection Time: 08/19/16 11:44 PM  Result Value Ref Range   Glucose-Capillary 254 (H) 65 - 99 mg/dL  Basic metabolic panel     Status: Abnormal   Collection Time: 08/20/16  4:25 AM  Result Value Ref Range   Sodium 134 (L) 135 - 145 mmol/L   Potassium 5.8 (H) 3.5 - 5.1 mmol/L   Chloride 102 101 - 111 mmol/L   CO2 23 22 - 32 mmol/L   Glucose, Bld 154 (H) 65 - 99 mg/dL   BUN 84 (H) 6 - 20 mg/dL   Creatinine, Ser 2.41 (H) 0.44 - 1.00 mg/dL   Calcium 8.8 (L) 8.9 - 10.3 mg/dL   GFR calc non Af Amer 17 (L) >60 mL/min   GFR calc Af Amer 20 (L) >60 mL/min    Comment: (NOTE) The eGFR has been calculated using the CKD EPI equation. This calculation has not been validated in all clinical situations. eGFR's persistently <60 mL/min signify possible Chronic Kidney Disease.    Anion gap 9 5 - 15  Protime-INR     Status: Abnormal   Collection Time: 08/20/16  4:25 AM   Result Value Ref Range   Prothrombin Time 29.7 (H) 11.4 - 15.2 seconds   INR 2.76   CBC     Status: Abnormal   Collection Time: 08/20/16  4:25 AM  Result Value Ref Range   WBC 7.3 4.0 - 10.5 K/uL   RBC 2.61 (L) 3.87 - 5.11 MIL/uL   Hemoglobin 8.2 (L) 12.0 - 15.0 g/dL   HCT 25.1 (L) 36.0 - 46.0 %   MCV 96.2 78.0 - 100.0 fL   MCH 31.4 26.0 - 34.0 pg   MCHC 32.7 30.0 - 36.0 g/dL   RDW 15.8 (H) 11.5 - 15.5 %   Platelets 276 150 - 400 K/uL  Glucose, capillary     Status: Abnormal   Collection Time: 08/20/16  4:51 AM  Result Value Ref Range   Glucose-Capillary 145 (H) 65 - 99 mg/dL  Glucose, capillary     Status: Abnormal   Collection Time: 08/20/16  7:35 AM  Result Value Ref Range   Glucose-Capillary 198 (H) 65 - 99 mg/dL    ABGS  No results for input(s): PHART, PO2ART, TCO2, HCO3 in the last 72 hours.  Invalid input(s): PCO2 CULTURES Recent Results (from the past 240 hour(s))  MRSA PCR Screening     Status: None   Collection Time: 08/19/16  3:44 PM  Result Value Ref Range Status   MRSA by PCR NEGATIVE NEGATIVE Final    Comment:        The GeneXpert MRSA Assay (FDA approved for NASAL specimens only), is one component of a comprehensive MRSA colonization surveillance program. It is not intended to diagnose MRSA infection nor to guide or monitor treatment for MRSA infections.    Studies/Results: Dg Chest Portable 1 View  Result Date: 08/18/2016 CLINICAL DATA:  83 year old female with shortness of breath. EXAM: PORTABLE CHEST 1 VIEW COMPARISON:  Chest radiograph dated 07/20/2016 FINDINGS: There is moderate cardiomegaly with interval increase in the size of the cardiopericardial silhouette compared to the prior study. A pericardial effusion is not excluded. There is mild central vascular and interstitial prominence. Bilateral lower lung field platelike atelectasis or scarring noted. No focal consolidation, pleural effusion, or pneumothorax. There is atherosclerotic  calcification of the thoracic aorta. No acute osseous pathology. IMPRESSION: 1. Moderate cardiomegaly with interval increase in the size of the cardiac silhouette. A pericardial effusion is not excluded. 2. Mild vascular congestion.  No focal consolidation. Electronically Signed   By: Anner Crete M.D.   On: 08/18/2016 21:38    Medications:  Prior to Admission:  Prescriptions Prior to Admission  Medication Sig Dispense Refill Last Dose  . acetaminophen (TYLENOL) 325 MG tablet Take 2 tablets (650 mg total) by mouth every 4 (four) hours as needed for mild pain, moderate pain, fever or headache.   unknown  . ALPRAZolam (XANAX) 0.25 MG tablet Take 0.25 mg by mouth 3 (three) times daily as needed.   unknown  . amLODipine (NORVASC) 2.5 MG tablet TAKE (1) TABLET BY MOUTH DAILY. 30 tablet 6 08/18/2016 at Unknown time  . atorvastatin (LIPITOR) 40 MG tablet Take 1 tablet (40 mg total) by mouth daily. 90 tablet 3 08/18/2016 at Unknown time  . clopidogrel (PLAVIX) 75 MG tablet Take 1 tablet (75 mg total) by mouth daily. 90 tablet 3 08/18/2016 at Unknown time  . glimepiride (AMARYL) 2 MG tablet Take 2 mg by mouth daily.     08/18/2016 at Unknown time  . hydrALAZINE (APRESOLINE) 100 MG tablet Take 1 tablet (100 mg total) by mouth 3 (three) times daily. 270 tablet 3 08/18/2016 at Unknown time  . HYDROcodone-acetaminophen (NORCO/VICODIN) 5-325 MG tablet Take 1 tablet by mouth 4 (four) times daily.    08/18/2016 at Unknown time  . insulin detemir (LEVEMIR) 100 UNIT/ML injection Inject 0.2 mLs (20 Units total) into the skin at bedtime. 10 mL 11 08/17/2016 at Unknown time  . insulin lispro (HUMALOG) 100 UNIT/ML injection Inject 0-0.15 mLs (0-15 Units total) into the skin 3 (three) times daily with meals. Use as sliding scale 10 mL 11 08/18/2016 at Unknown time  . isosorbide mononitrate (IMDUR) 60 MG 24 hr tablet Take 1 tablet (60 mg total) by mouth daily. 90 tablet 3 08/18/2016 at Unknown time  . nitroGLYCERIN (NITROSTAT)  0.4 MG SL tablet Place 1 tablet (0.4 mg total) under the tongue every 5 (five) minutes as needed for chest pain. 25 tablet 2 unknown  . pantoprazole (PROTONIX) 40 MG tablet Take 1 tablet (40 mg total) by mouth daily at 6 (six) AM. 90 tablet 3 08/18/2016 at Unknown time  . PROAIR HFA  108 (90 Base) MCG/ACT inhaler Inhale 1-2 puffs into the lungs 4 (four) times daily.    08/18/2016 at Unknown time  . torsemide (DEMADEX) 20 MG tablet Take 2 tablets (40 mg total) by mouth 2 (two) times daily. 120 tablet 6 08/18/2016 at Unknown time  . TRAVATAN Z 0.004 % SOLN ophthalmic solution Place 1 drop into both eyes at bedtime.    08/17/2016 at Unknown time  . umeclidinium bromide (INCRUSE ELLIPTA) 62.5 MCG/INH AEPB Inhale 1 puff into the lungs daily.   08/18/2016 at Unknown time  . warfarin (COUMADIN) 5 MG tablet TAKE 1 TABLET BY MOUTH DAILY EXCEPT 1/2 TABLET ON WEDNESDAYS AND SATURDAYS. 30 tablet 6 08/17/2016 at 2130   Scheduled: . amLODipine  2.5 mg Oral Daily  . atorvastatin  40 mg Oral q1800  . clopidogrel  75 mg Oral Daily  . furosemide  60 mg Intravenous Q8H  . glimepiride  2 mg Oral Daily  . hydrALAZINE  100 mg Oral TID  . insulin aspart  0-15 Units Subcutaneous Q4H  . insulin detemir  10 Units Subcutaneous QHS  . ipratropium-albuterol  3 mL Nebulization Q6H  . isosorbide mononitrate  60 mg Oral Daily  . latanoprost  1 drop Both Eyes QHS  . methylPREDNISolone (SOLU-MEDROL) injection  60 mg Intravenous Q12H  . nitroGLYCERIN  0.5 inch Topical Q6H  . pantoprazole  40 mg Oral Q0600  . sodium chloride flush  3 mL Intravenous Q12H   Continuous: . sodium chloride     WOE:HOZYYQ chloride, acetaminophen, albuterol, ALPRAZolam, metoprolol tartrate, morphine injection, ondansetron (ZOFRAN) IV, sodium chloride flush  Assesment: She has acute on chronic diastolic heart failure. She is better but still with significant edema. She had chest pain and she is known to have coronary disease but was not a candidate for  urgent Cardiac catheterization. She has hypertension which is fairly well controlled. She has chronic atrial fib on chronic anticoagulation. She has chronic kidney disease which complicates her treatment she also has diabetes and was in the hospital recently with uncontrolled diabetes. Principal Problem:   Acute on chronic diastolic CHF (congestive heart failure) (HCC) Active Problems:   Essential hypertension, benign   Persistent atrial fibrillation ([place])   Long term current use of anticoagulant   Chronic kidney disease (CKD), stage IV (severe) (HCC)   Status post coronary artery stent placement   Diabetes mellitus type 2, controlled ([place])   Acute on chronic diastolic (congestive) heart failure ([place])    Plan: Continue current treatments    LOS: 1 day   Cassidi Modesitt L 08/20/2016, 9:15 AM

## 2016-08-21 LAB — BASIC METABOLIC PANEL
Anion gap: 10 (ref 5–15)
BUN: 92 mg/dL — AB (ref 6–20)
CHLORIDE: 102 mmol/L (ref 101–111)
CO2: 23 mmol/L (ref 22–32)
CREATININE: 2.29 mg/dL — AB (ref 0.44–1.00)
Calcium: 8.5 mg/dL — ABNORMAL LOW (ref 8.9–10.3)
GFR calc Af Amer: 22 mL/min — ABNORMAL LOW (ref >60)
GFR calc non Af Amer: 19 mL/min — ABNORMAL LOW (ref >60)
GLUCOSE: 162 mg/dL — AB (ref 65–99)
POTASSIUM: 5.6 mmol/L — AB (ref 3.5–5.1)
SODIUM: 135 mmol/L (ref 135–145)

## 2016-08-21 LAB — GLUCOSE, CAPILLARY
GLUCOSE-CAPILLARY: 174 mg/dL — AB (ref 65–99)
GLUCOSE-CAPILLARY: 120 mg/dL — AB (ref 65–99)
GLUCOSE-CAPILLARY: 178 mg/dL — AB (ref 65–99)
GLUCOSE-CAPILLARY: 204 mg/dL — AB (ref 65–99)
GLUCOSE-CAPILLARY: 267 mg/dL — AB (ref 65–99)
Glucose-Capillary: 194 mg/dL — ABNORMAL HIGH (ref 65–99)

## 2016-08-21 LAB — PROTIME-INR
INR: 2.55
PROTHROMBIN TIME: 27.9 s — AB (ref 11.4–15.2)

## 2016-08-21 MED ORDER — IPRATROPIUM-ALBUTEROL 0.5-2.5 (3) MG/3ML IN SOLN
3.0000 mL | Freq: Four times a day (QID) | RESPIRATORY_TRACT | Status: DC
  Administered 2016-08-21 – 2016-08-22 (×7): 3 mL via RESPIRATORY_TRACT
  Filled 2016-08-21 (×7): qty 3

## 2016-08-21 MED ORDER — FUROSEMIDE 10 MG/ML IJ SOLN
80.0000 mg | Freq: Three times a day (TID) | INTRAMUSCULAR | Status: DC
  Administered 2016-08-21 – 2016-08-23 (×6): 80 mg via INTRAVENOUS
  Filled 2016-08-21 (×6): qty 8

## 2016-08-21 MED ORDER — SODIUM POLYSTYRENE SULFONATE 15 GM/60ML PO SUSP
30.0000 g | Freq: Once | ORAL | Status: AC
  Administered 2016-08-21: 30 g via ORAL
  Filled 2016-08-21: qty 120

## 2016-08-21 NOTE — Progress Notes (Signed)
Subjective: She says she feels a little bit better. She's been noted to have more swelling around her hips and in her arms. Her potassium is elevated again this morning. I thought her Lasix will probably take care of her elevated potassium yesterday but it has not. She's not having any chest pain. No other new complaints  Objective: Vital signs in last 24 hours: Temp:  [97.6 F (36.4 C)-98.8 F (37.1 C)] 97.6 F (36.4 C) (07/15 0400) Pulse Rate:  [67-83] 76 (07/14 1800) Resp:  [0-31] 24 (07/14 1800) BP: (127-152)/(66-95) 152/81 (07/14 2235) SpO2:  [95 %-99 %] 96 % (07/14 2036) Weight:  [101.5 kg (223 lb 12.3 oz)] 101.5 kg (223 lb 12.3 oz) (07/15 0500) Weight change: 1.5 kg (3 lb 4.9 oz) Last BM Date: 08/18/16 (reported by daughter)  Intake/Output from previous day: 07/14 0701 - 07/15 0700 In: 63 [P.O.:600; I.V.:6] Out: 1500 [Urine:1500]  PHYSICAL EXAM General appearance: alert, cooperative, mild distress and Very hard of hearing Resp: rales bibasilar Cardio: irregularly irregular rhythm GI: soft, non-tender; bowel sounds normal; no masses,  no organomegaly Extremities: She has less edema of her legs but she has swelling more in the hips and in her arms down. She is very hard of hearing. Throat is clear  Lab Results:  Results for orders placed or performed during the hospital encounter of 08/18/16 (from the past 48 hour(s))  CBG monitoring, ED     Status: Abnormal   Collection Time: 08/19/16  8:29 AM  Result Value Ref Range   Glucose-Capillary 173 (H) 65 - 99 mg/dL  Troponin I (q 6hr x 3)     Status: Abnormal   Collection Time: 08/19/16 10:02 AM  Result Value Ref Range   Troponin I 0.06 (HH) <0.03 ng/mL    Comment: CRITICAL RESULT CALLED TO, READ BACK BY AND VERIFIED WITH: KENDRICK,J @ 1150 ON 7.13.18 BY BOWMAN,L   CBG monitoring, ED     Status: Abnormal   Collection Time: 08/19/16 12:47 PM  Result Value Ref Range   Glucose-Capillary 211 (H) 65 - 99 mg/dL  MRSA PCR  Screening     Status: None   Collection Time: 08/19/16  3:44 PM  Result Value Ref Range   MRSA by PCR NEGATIVE NEGATIVE    Comment:        The GeneXpert MRSA Assay (FDA approved for NASAL specimens only), is one component of a comprehensive MRSA colonization surveillance program. It is not intended to diagnose MRSA infection nor to guide or monitor treatment for MRSA infections.   Troponin I (q 6hr x 3)     Status: Abnormal   Collection Time: 08/19/16  4:25 PM  Result Value Ref Range   Troponin I 0.10 (HH) <0.03 ng/mL    Comment: CRITICAL VALUE NOTED.  VALUE IS CONSISTENT WITH PREVIOUSLY REPORTED AND CALLED VALUE.  Glucose, capillary     Status: Abnormal   Collection Time: 08/19/16  5:42 PM  Result Value Ref Range   Glucose-Capillary 262 (H) 65 - 99 mg/dL  Glucose, capillary     Status: Abnormal   Collection Time: 08/19/16  8:00 PM  Result Value Ref Range   Glucose-Capillary 258 (H) 65 - 99 mg/dL   Comment 1 Notify RN    Comment 2 Document in Chart   Troponin I (q 6hr x 3)     Status: Abnormal   Collection Time: 08/19/16 10:46 PM  Result Value Ref Range   Troponin I 0.13 (HH) <0.03 ng/mL  Comment: CRITICAL RESULT CALLED TO, READ BACK BY AND VERIFIED WITH:  ROBERTS,T @ 2350 ON 08/19/16 BY JUW   Glucose, capillary     Status: Abnormal   Collection Time: 08/19/16 11:44 PM  Result Value Ref Range   Glucose-Capillary 254 (H) 65 - 99 mg/dL  Basic metabolic panel     Status: Abnormal   Collection Time: 08/20/16  4:25 AM  Result Value Ref Range   Sodium 134 (L) 135 - 145 mmol/L   Potassium 5.8 (H) 3.5 - 5.1 mmol/L   Chloride 102 101 - 111 mmol/L   CO2 23 22 - 32 mmol/L   Glucose, Bld 154 (H) 65 - 99 mg/dL   BUN 84 (H) 6 - 20 mg/dL   Creatinine, Ser 2.41 (H) 0.44 - 1.00 mg/dL   Calcium 8.8 (L) 8.9 - 10.3 mg/dL   GFR calc non Af Amer 17 (L) >60 mL/min   GFR calc Af Amer 20 (L) >60 mL/min    Comment: (NOTE) The eGFR has been calculated using the CKD EPI equation. This  calculation has not been validated in all clinical situations. eGFR's persistently <60 mL/min signify possible Chronic Kidney Disease.    Anion gap 9 5 - 15  Protime-INR     Status: Abnormal   Collection Time: 08/20/16  4:25 AM  Result Value Ref Range   Prothrombin Time 29.7 (H) 11.4 - 15.2 seconds   INR 2.76   CBC     Status: Abnormal   Collection Time: 08/20/16  4:25 AM  Result Value Ref Range   WBC 7.3 4.0 - 10.5 K/uL   RBC 2.61 (L) 3.87 - 5.11 MIL/uL   Hemoglobin 8.2 (L) 12.0 - 15.0 g/dL   HCT 25.1 (L) 36.0 - 46.0 %   MCV 96.2 78.0 - 100.0 fL   MCH 31.4 26.0 - 34.0 pg   MCHC 32.7 30.0 - 36.0 g/dL   RDW 15.8 (H) 11.5 - 15.5 %   Platelets 276 150 - 400 K/uL  Glucose, capillary     Status: Abnormal   Collection Time: 08/20/16  4:51 AM  Result Value Ref Range   Glucose-Capillary 145 (H) 65 - 99 mg/dL  Glucose, capillary     Status: Abnormal   Collection Time: 08/20/16  7:35 AM  Result Value Ref Range   Glucose-Capillary 198 (H) 65 - 99 mg/dL  Glucose, capillary     Status: Abnormal   Collection Time: 08/20/16 11:29 AM  Result Value Ref Range   Glucose-Capillary 278 (H) 65 - 99 mg/dL  Glucose, capillary     Status: Abnormal   Collection Time: 08/20/16  4:26 PM  Result Value Ref Range   Glucose-Capillary 269 (H) 65 - 99 mg/dL  Glucose, capillary     Status: Abnormal   Collection Time: 08/20/16  8:17 PM  Result Value Ref Range   Glucose-Capillary 300 (H) 65 - 99 mg/dL  Glucose, capillary     Status: Abnormal   Collection Time: 08/21/16 12:18 AM  Result Value Ref Range   Glucose-Capillary 194 (H) 65 - 99 mg/dL  Glucose, capillary     Status: Abnormal   Collection Time: 08/21/16  4:05 AM  Result Value Ref Range   Glucose-Capillary 174 (H) 65 - 99 mg/dL  Basic metabolic panel     Status: Abnormal   Collection Time: 08/21/16  4:33 AM  Result Value Ref Range   Sodium 135 135 - 145 mmol/L   Potassium 5.6 (H) 3.5 - 5.1 mmol/L  Chloride 102 101 - 111 mmol/L   CO2 23 22 -  32 mmol/L   Glucose, Bld 162 (H) 65 - 99 mg/dL   BUN 92 (H) 6 - 20 mg/dL   Creatinine, Ser 2.29 (H) 0.44 - 1.00 mg/dL   Calcium 8.5 (L) 8.9 - 10.3 mg/dL   GFR calc non Af Amer 19 (L) >60 mL/min   GFR calc Af Amer 22 (L) >60 mL/min    Comment: (NOTE) The eGFR has been calculated using the CKD EPI equation. This calculation has not been validated in all clinical situations. eGFR's persistently <60 mL/min signify possible Chronic Kidney Disease.    Anion gap 10 5 - 15  Protime-INR     Status: Abnormal   Collection Time: 08/21/16  4:33 AM  Result Value Ref Range   Prothrombin Time 27.9 (H) 11.4 - 15.2 seconds   INR 2.55     ABGS No results for input(s): PHART, PO2ART, TCO2, HCO3 in the last 72 hours.  Invalid input(s): PCO2 CULTURES Recent Results (from the past 240 hour(s))  MRSA PCR Screening     Status: None   Collection Time: 08/19/16  3:44 PM  Result Value Ref Range Status   MRSA by PCR NEGATIVE NEGATIVE Final    Comment:        The GeneXpert MRSA Assay (FDA approved for NASAL specimens only), is one component of a comprehensive MRSA colonization surveillance program. It is not intended to diagnose MRSA infection nor to guide or monitor treatment for MRSA infections.    Studies/Results: No results found.  Medications:  Prior to Admission:  Prescriptions Prior to Admission  Medication Sig Dispense Refill Last Dose  . acetaminophen (TYLENOL) 325 MG tablet Take 2 tablets (650 mg total) by mouth every 4 (four) hours as needed for mild pain, moderate pain, fever or headache.   unknown  . ALPRAZolam (XANAX) 0.25 MG tablet Take 0.25 mg by mouth 3 (three) times daily as needed.   unknown  . amLODipine (NORVASC) 2.5 MG tablet TAKE (1) TABLET BY MOUTH DAILY. 30 tablet 6 08/18/2016 at Unknown time  . atorvastatin (LIPITOR) 40 MG tablet Take 1 tablet (40 mg total) by mouth daily. 90 tablet 3 08/18/2016 at Unknown time  . clopidogrel (PLAVIX) 75 MG tablet Take 1 tablet (75 mg  total) by mouth daily. 90 tablet 3 08/18/2016 at Unknown time  . glimepiride (AMARYL) 2 MG tablet Take 2 mg by mouth daily.     08/18/2016 at Unknown time  . hydrALAZINE (APRESOLINE) 100 MG tablet Take 1 tablet (100 mg total) by mouth 3 (three) times daily. 270 tablet 3 08/18/2016 at Unknown time  . HYDROcodone-acetaminophen (NORCO/VICODIN) 5-325 MG tablet Take 1 tablet by mouth 4 (four) times daily.    08/18/2016 at Unknown time  . insulin detemir (LEVEMIR) 100 UNIT/ML injection Inject 0.2 mLs (20 Units total) into the skin at bedtime. 10 mL 11 08/17/2016 at Unknown time  . insulin lispro (HUMALOG) 100 UNIT/ML injection Inject 0-0.15 mLs (0-15 Units total) into the skin 3 (three) times daily with meals. Use as sliding scale 10 mL 11 08/18/2016 at Unknown time  . isosorbide mononitrate (IMDUR) 60 MG 24 hr tablet Take 1 tablet (60 mg total) by mouth daily. 90 tablet 3 08/18/2016 at Unknown time  . nitroGLYCERIN (NITROSTAT) 0.4 MG SL tablet Place 1 tablet (0.4 mg total) under the tongue every 5 (five) minutes as needed for chest pain. 25 tablet 2 unknown  . pantoprazole (PROTONIX) 40 MG tablet  Take 1 tablet (40 mg total) by mouth daily at 6 (six) AM. 90 tablet 3 08/18/2016 at Unknown time  . PROAIR HFA 108 (90 Base) MCG/ACT inhaler Inhale 1-2 puffs into the lungs 4 (four) times daily.    08/18/2016 at Unknown time  . torsemide (DEMADEX) 20 MG tablet Take 2 tablets (40 mg total) by mouth 2 (two) times daily. 120 tablet 6 08/18/2016 at Unknown time  . TRAVATAN Z 0.004 % SOLN ophthalmic solution Place 1 drop into both eyes at bedtime.    08/17/2016 at Unknown time  . umeclidinium bromide (INCRUSE ELLIPTA) 62.5 MCG/INH AEPB Inhale 1 puff into the lungs daily.   08/18/2016 at Unknown time  . warfarin (COUMADIN) 5 MG tablet TAKE 1 TABLET BY MOUTH DAILY EXCEPT 1/2 TABLET ON WEDNESDAYS AND SATURDAYS. 30 tablet 6 08/17/2016 at 2130   Scheduled: . amLODipine  2.5 mg Oral Daily  . atorvastatin  40 mg Oral q1800  .  clopidogrel  75 mg Oral Daily  . furosemide  80 mg Intravenous Q8H  . glimepiride  2 mg Oral Daily  . hydrALAZINE  100 mg Oral TID  . insulin aspart  0-15 Units Subcutaneous Q4H  . insulin detemir  10 Units Subcutaneous QHS  . ipratropium-albuterol  3 mL Nebulization Q6H WA  . isosorbide mononitrate  60 mg Oral Daily  . latanoprost  1 drop Both Eyes QHS  . methylPREDNISolone (SOLU-MEDROL) injection  60 mg Intravenous Q12H  . nitroGLYCERIN  0.5 inch Topical Q6H  . pantoprazole  40 mg Oral Q0600  . sodium chloride flush  3 mL Intravenous Q12H  . sodium polystyrene  30 g Oral Once   Continuous: . sodium chloride     AFH:SVEXOG chloride, acetaminophen, albuterol, ALPRAZolam, metoprolol tartrate, morphine injection, ondansetron (ZOFRAN) IV, sodium chloride flush  Assesment: She was admitted with acute on chronic diastolic heart failure. Her situation is complicated by the fact that she has chronic atrial fib chronically anticoagulated chronic kidney disease. She has coronary disease at baseline. She has diabetes at baseline and that's pre-well controlled here. She had some chest pain on admission but that has resolved. Her renal function is a little bit better. Her potassium is elevated again today. She has continued fluid overload despite being about 3 L negative. Principal Problem:   Acute on chronic diastolic CHF (congestive heart failure) (HCC) Active Problems:   Essential hypertension, benign   Persistent atrial fibrillation (HCC)   Long term current use of anticoagulant   Chronic kidney disease (CKD), stage IV (severe) (HCC)   Status post coronary artery stent placement   Diabetes mellitus type 2, controlled ([place])   Acute on chronic diastolic (congestive) heart failure ([place])    Plan: Increase Lasix. Give her a single dose of Kayexalate. Continue other treatments.    LOS: 2 days   Makyah Lavigne L 08/21/2016, 7:09 AM

## 2016-08-21 NOTE — Progress Notes (Signed)
Patient does not usually wear oxygen , she was on 2lpm with saturation running 98-99 removed oxygen for about 45 minutes she dropped down to 92. Replaced oxygen at 1 lpm saturation back up to 96.

## 2016-08-22 ENCOUNTER — Inpatient Hospital Stay (HOSPITAL_COMMUNITY): Payer: Medicare Other

## 2016-08-22 LAB — BASIC METABOLIC PANEL
ANION GAP: 11 (ref 5–15)
BUN: 97 mg/dL — ABNORMAL HIGH (ref 6–20)
CALCIUM: 7.7 mg/dL — AB (ref 8.9–10.3)
CO2: 25 mmol/L (ref 22–32)
Chloride: 99 mmol/L — ABNORMAL LOW (ref 101–111)
Creatinine, Ser: 2.29 mg/dL — ABNORMAL HIGH (ref 0.44–1.00)
GFR calc Af Amer: 22 mL/min — ABNORMAL LOW (ref >60)
GFR, EST NON AFRICAN AMERICAN: 19 mL/min — AB (ref >60)
GLUCOSE: 245 mg/dL — AB (ref 65–99)
Potassium: 4.1 mmol/L (ref 3.5–5.1)
Sodium: 135 mmol/L (ref 135–145)

## 2016-08-22 LAB — PROTIME-INR
INR: 2
Prothrombin Time: 23 seconds — ABNORMAL HIGH (ref 11.4–15.2)

## 2016-08-22 LAB — GLUCOSE, CAPILLARY
GLUCOSE-CAPILLARY: 159 mg/dL — AB (ref 65–99)
GLUCOSE-CAPILLARY: 241 mg/dL — AB (ref 65–99)
GLUCOSE-CAPILLARY: 243 mg/dL — AB (ref 65–99)
GLUCOSE-CAPILLARY: 288 mg/dL — AB (ref 65–99)
Glucose-Capillary: 173 mg/dL — ABNORMAL HIGH (ref 65–99)
Glucose-Capillary: 294 mg/dL — ABNORMAL HIGH (ref 65–99)
Glucose-Capillary: 316 mg/dL — ABNORMAL HIGH (ref 65–99)

## 2016-08-22 MED ORDER — INSULIN DETEMIR 100 UNIT/ML ~~LOC~~ SOLN
15.0000 [IU] | Freq: Every day | SUBCUTANEOUS | Status: DC
  Administered 2016-08-22 – 2016-08-25 (×4): 15 [IU] via SUBCUTANEOUS
  Filled 2016-08-22 (×4): qty 0.15

## 2016-08-22 MED ORDER — IPRATROPIUM-ALBUTEROL 0.5-2.5 (3) MG/3ML IN SOLN
3.0000 mL | Freq: Three times a day (TID) | RESPIRATORY_TRACT | Status: DC
  Administered 2016-08-23 – 2016-08-27 (×12): 3 mL via RESPIRATORY_TRACT
  Filled 2016-08-22 (×13): qty 3

## 2016-08-22 NOTE — Progress Notes (Addendum)
Subjective: She says she feels better but she still has substantial swelling. She is now down by about 5 L. She is on pretty high doses of Lasix. She has no other new complaints. No chest pain no nausea no vomiting IV access has become more problematic  Objective: Vital signs in last 24 hours: Temp:  [97.6 F (36.4 C)-99.4 F (37.4 C)] 97.6 F (36.4 C) (07/16 0710) Pulse Rate:  [70-80] 74 (07/16 0710) Resp:  [6-35] 24 (07/16 0710) BP: (120-156)/(72-83) 129/72 (07/15 2231) SpO2:  [96 %-99 %] 96 % (07/16 0726) Weight:  [101.7 kg (224 lb 3.3 oz)] 101.7 kg (224 lb 3.3 oz) (07/16 0500) Weight change: 0.2 kg (7.1 oz) Last BM Date: 08/18/16 (reported by daughter)  Intake/Output from previous day: 07/15 0701 - 07/16 0700 In: 489 [P.O.:480; I.V.:9] Out: 2750 [Urine:2750]  PHYSICAL EXAM General appearance: alert, cooperative and no distress Resp: rales bibasilar Cardio: irregularly irregular rhythm GI: soft, non-tender; bowel sounds normal; no masses,  no organomegaly Extremities: She still has edema of her legs up into her thighs and hips and of her arms although it is somewhat better than on admission She is very hard of hearing  Lab Results:  Results for orders placed or performed during the hospital encounter of 08/18/16 (from the past 48 hour(s))  Glucose, capillary     Status: Abnormal   Collection Time: 08/20/16 11:29 AM  Result Value Ref Range   Glucose-Capillary 278 (H) 65 - 99 mg/dL  Glucose, capillary     Status: Abnormal   Collection Time: 08/20/16  4:26 PM  Result Value Ref Range   Glucose-Capillary 269 (H) 65 - 99 mg/dL  Glucose, capillary     Status: Abnormal   Collection Time: 08/20/16  8:17 PM  Result Value Ref Range   Glucose-Capillary 300 (H) 65 - 99 mg/dL  Glucose, capillary     Status: Abnormal   Collection Time: 08/21/16 12:18 AM  Result Value Ref Range   Glucose-Capillary 194 (H) 65 - 99 mg/dL  Glucose, capillary     Status: Abnormal   Collection Time:  08/21/16  4:05 AM  Result Value Ref Range   Glucose-Capillary 174 (H) 65 - 99 mg/dL  Basic metabolic panel     Status: Abnormal   Collection Time: 08/21/16  4:33 AM  Result Value Ref Range   Sodium 135 135 - 145 mmol/L   Potassium 5.6 (H) 3.5 - 5.1 mmol/L   Chloride 102 101 - 111 mmol/L   CO2 23 22 - 32 mmol/L   Glucose, Bld 162 (H) 65 - 99 mg/dL   BUN 92 (H) 6 - 20 mg/dL   Creatinine, Ser 2.29 (H) 0.44 - 1.00 mg/dL   Calcium 8.5 (L) 8.9 - 10.3 mg/dL   GFR calc non Af Amer 19 (L) >60 mL/min   GFR calc Af Amer 22 (L) >60 mL/min    Comment: (NOTE) The eGFR has been calculated using the CKD EPI equation. This calculation has not been validated in all clinical situations. eGFR's persistently <60 mL/min signify possible Chronic Kidney Disease.    Anion gap 10 5 - 15  Protime-INR     Status: Abnormal   Collection Time: 08/21/16  4:33 AM  Result Value Ref Range   Prothrombin Time 27.9 (H) 11.4 - 15.2 seconds   INR 2.55   Glucose, capillary     Status: Abnormal   Collection Time: 08/21/16  7:45 AM  Result Value Ref Range   Glucose-Capillary 120 (H) 65 -  99 mg/dL  Glucose, capillary     Status: Abnormal   Collection Time: 08/21/16 11:27 AM  Result Value Ref Range   Glucose-Capillary 178 (H) 65 - 99 mg/dL  Glucose, capillary     Status: Abnormal   Collection Time: 08/21/16  4:21 PM  Result Value Ref Range   Glucose-Capillary 204 (H) 65 - 99 mg/dL  Glucose, capillary     Status: Abnormal   Collection Time: 08/21/16  8:01 PM  Result Value Ref Range   Glucose-Capillary 267 (H) 65 - 99 mg/dL  Glucose, capillary     Status: Abnormal   Collection Time: 08/22/16 12:19 AM  Result Value Ref Range   Glucose-Capillary 241 (H) 65 - 99 mg/dL  Glucose, capillary     Status: Abnormal   Collection Time: 08/22/16  3:27 AM  Result Value Ref Range   Glucose-Capillary 243 (H) 65 - 99 mg/dL  Basic metabolic panel     Status: Abnormal   Collection Time: 08/22/16  4:34 AM  Result Value Ref Range    Sodium 135 135 - 145 mmol/L   Potassium 4.1 3.5 - 5.1 mmol/L    Comment: DELTA CHECK NOTED   Chloride 99 (L) 101 - 111 mmol/L   CO2 25 22 - 32 mmol/L   Glucose, Bld 245 (H) 65 - 99 mg/dL   BUN 97 (H) 6 - 20 mg/dL   Creatinine, Ser 2.29 (H) 0.44 - 1.00 mg/dL   Calcium 7.7 (L) 8.9 - 10.3 mg/dL   GFR calc non Af Amer 19 (L) >60 mL/min   GFR calc Af Amer 22 (L) >60 mL/min    Comment: (NOTE) The eGFR has been calculated using the CKD EPI equation. This calculation has not been validated in all clinical situations. eGFR's persistently <60 mL/min signify possible Chronic Kidney Disease.    Anion gap 11 5 - 15  Protime-INR     Status: Abnormal   Collection Time: 08/22/16  4:34 AM  Result Value Ref Range   Prothrombin Time 23.0 (H) 11.4 - 15.2 seconds   INR 2.00   Glucose, capillary     Status: Abnormal   Collection Time: 08/22/16  7:09 AM  Result Value Ref Range   Glucose-Capillary 159 (H) 65 - 99 mg/dL    ABGS No results for input(s): PHART, PO2ART, TCO2, HCO3 in the last 72 hours.  Invalid input(s): PCO2 CULTURES Recent Results (from the past 240 hour(s))  MRSA PCR Screening     Status: None   Collection Time: 08/19/16  3:44 PM  Result Value Ref Range Status   MRSA by PCR NEGATIVE NEGATIVE Final    Comment:        The GeneXpert MRSA Assay (FDA approved for NASAL specimens only), is one component of a comprehensive MRSA colonization surveillance program. It is not intended to diagnose MRSA infection nor to guide or monitor treatment for MRSA infections.    Studies/Results: No results found.  Medications:  Prior to Admission:  Prescriptions Prior to Admission  Medication Sig Dispense Refill Last Dose  . acetaminophen (TYLENOL) 325 MG tablet Take 2 tablets (650 mg total) by mouth every 4 (four) hours as needed for mild pain, moderate pain, fever or headache.   unknown  . ALPRAZolam (XANAX) 0.25 MG tablet Take 0.25 mg by mouth 3 (three) times daily as needed.    unknown  . amLODipine (NORVASC) 2.5 MG tablet TAKE (1) TABLET BY MOUTH DAILY. 30 tablet 6 08/18/2016 at Unknown time  . atorvastatin (LIPITOR)  40 MG tablet Take 1 tablet (40 mg total) by mouth daily. 90 tablet 3 08/18/2016 at Unknown time  . clopidogrel (PLAVIX) 75 MG tablet Take 1 tablet (75 mg total) by mouth daily. 90 tablet 3 08/18/2016 at Unknown time  . glimepiride (AMARYL) 2 MG tablet Take 2 mg by mouth daily.     08/18/2016 at Unknown time  . hydrALAZINE (APRESOLINE) 100 MG tablet Take 1 tablet (100 mg total) by mouth 3 (three) times daily. 270 tablet 3 08/18/2016 at Unknown time  . HYDROcodone-acetaminophen (NORCO/VICODIN) 5-325 MG tablet Take 1 tablet by mouth 4 (four) times daily.    08/18/2016 at Unknown time  . insulin detemir (LEVEMIR) 100 UNIT/ML injection Inject 0.2 mLs (20 Units total) into the skin at bedtime. 10 mL 11 08/17/2016 at Unknown time  . insulin lispro (HUMALOG) 100 UNIT/ML injection Inject 0-0.15 mLs (0-15 Units total) into the skin 3 (three) times daily with meals. Use as sliding scale 10 mL 11 08/18/2016 at Unknown time  . isosorbide mononitrate (IMDUR) 60 MG 24 hr tablet Take 1 tablet (60 mg total) by mouth daily. 90 tablet 3 08/18/2016 at Unknown time  . nitroGLYCERIN (NITROSTAT) 0.4 MG SL tablet Place 1 tablet (0.4 mg total) under the tongue every 5 (five) minutes as needed for chest pain. 25 tablet 2 unknown  . pantoprazole (PROTONIX) 40 MG tablet Take 1 tablet (40 mg total) by mouth daily at 6 (six) AM. 90 tablet 3 08/18/2016 at Unknown time  . PROAIR HFA 108 (90 Base) MCG/ACT inhaler Inhale 1-2 puffs into the lungs 4 (four) times daily.    08/18/2016 at Unknown time  . torsemide (DEMADEX) 20 MG tablet Take 2 tablets (40 mg total) by mouth 2 (two) times daily. 120 tablet 6 08/18/2016 at Unknown time  . TRAVATAN Z 0.004 % SOLN ophthalmic solution Place 1 drop into both eyes at bedtime.    08/17/2016 at Unknown time  . umeclidinium bromide (INCRUSE ELLIPTA) 62.5 MCG/INH AEPB  Inhale 1 puff into the lungs daily.   08/18/2016 at Unknown time  . warfarin (COUMADIN) 5 MG tablet TAKE 1 TABLET BY MOUTH DAILY EXCEPT 1/2 TABLET ON WEDNESDAYS AND SATURDAYS. 30 tablet 6 08/17/2016 at 2130   Scheduled: . amLODipine  2.5 mg Oral Daily  . atorvastatin  40 mg Oral q1800  . clopidogrel  75 mg Oral Daily  . furosemide  80 mg Intravenous Q8H  . glimepiride  2 mg Oral Daily  . hydrALAZINE  100 mg Oral TID  . insulin aspart  0-15 Units Subcutaneous Q4H  . insulin detemir  10 Units Subcutaneous QHS  . ipratropium-albuterol  3 mL Nebulization Q6H WA  . isosorbide mononitrate  60 mg Oral Daily  . latanoprost  1 drop Both Eyes QHS  . methylPREDNISolone (SOLU-MEDROL) injection  60 mg Intravenous Q12H  . nitroGLYCERIN  0.5 inch Topical Q6H  . pantoprazole  40 mg Oral Q0600  . sodium chloride flush  3 mL Intravenous Q12H   Continuous: . sodium chloride     YNW:GNFAOZ chloride, acetaminophen, albuterol, ALPRAZolam, metoprolol tartrate, morphine injection, ondansetron (ZOFRAN) IV, sodium chloride flush  Assesment: She has acute on chronic diastolic heart failure. Although she is down about 5 L she still has a lot of swelling. She has chronic atrial fib on anticoagulation. She's now on heparin. She has chronic kidney disease stage IV. Renal function is holding steady. Her potassium level was up but was treated with Kayexalate and is better. Blood sugar still up  some social need an adjustment in her insulin Principal Problem:   Acute on chronic diastolic CHF (congestive heart failure) (HCC) Active Problems:   Essential hypertension, benign   Persistent atrial fibrillation ([place])   Long term current use of anticoagulant   Chronic kidney disease (CKD), stage IV (severe) (HCC)   Status post coronary artery stent placement   Diabetes mellitus type 2, controlled ([place])   Acute on chronic diastolic (congestive) heart failure ([place])    Plan: Adjust insulin. Continue current treatments. PICC  line if needed    LOS: 3 days   Kevante Lunt L 08/22/2016, 7:36 AM

## 2016-08-22 NOTE — Progress Notes (Signed)
Progress Note  Patient Name: Melissa [name] Date of Encounter: 08/22/2016  Primary Cardiologist: Diona Browner  Subjective   Breathing better  No CP    Inpatient Medications    Scheduled Meds: . amLODipine  2.5 mg Oral Daily  . atorvastatin  40 mg Oral q1800  . clopidogrel  75 mg Oral Daily  . furosemide  80 mg Intravenous Q8H  . glimepiride  2 mg Oral Daily  . hydrALAZINE  100 mg Oral TID  . insulin aspart  0-15 Units Subcutaneous Q4H  . insulin detemir  15 Units Subcutaneous QHS  . ipratropium-albuterol  3 mL Nebulization Q6H WA  . isosorbide mononitrate  60 mg Oral Daily  . latanoprost  1 drop Both Eyes QHS  . methylPREDNISolone (SOLU-MEDROL) injection  60 mg Intravenous Q12H  . nitroGLYCERIN  0.5 inch Topical Q6H  . pantoprazole  40 mg Oral Q0600  . sodium chloride flush  3 mL Intravenous Q12H   Continuous Infusions: . sodium chloride     PRN Meds: sodium chloride, acetaminophen, albuterol, ALPRAZolam, metoprolol tartrate, morphine injection, ondansetron (ZOFRAN) IV, sodium chloride flush   Vital Signs    Vitals:   08/22/16 0710 08/22/16 0726 08/22/16 1119 08/22/16 1450  BP:      Pulse: 74  80   Resp: (!) 24  18   Temp: 97.6 F (36.4 C)  (!) 97.5 F (36.4 C)   TempSrc: Oral  Oral   SpO2: 97% 96% 98% 98%  Weight:      Height:        Intake/Output Summary (Last 24 hours) at 08/22/16 1505 Last data filed at 08/22/16 0955  Gross per 24 hour  Intake                3 ml  Output             2850 ml  Net            -2847 ml   Filed Weights   08/20/16 0500 08/21/16 0500 08/22/16 0500  Weight: 221 lb 5.5 oz (100.4 kg) 223 lb 12.3 oz (101.5 kg) 224 lb 3.3 oz (101.7 kg)    Telemetry    SR   - Personally Reviewed  ECG     Physical Exam   GEN: No acute distress.   Neck: No JVD Cardiac: RRR, no murmurs, rubs, or gallops.  Respiratory: Clear to auscultation bilaterally. GI: Soft, nontender, non-distended  MS: Tr  edema; No deformity. Neuro:   Nonfocal  Psych: Normal affect   Labs    Chemistry Recent Labs Lab 08/18/16 2210 08/20/16 0425 08/21/16 0433 08/22/16 0434  NA 133* 134* 135 135  K 5.1 5.8* 5.6* 4.1  CL 98* 102 102 99*  CO2 24 23 23 25   GLUCOSE 106* 154* 162* 245*  BUN 70* 84* 92* 97*  CREATININE 2.27* 2.41* 2.29* 2.29*  CALCIUM 8.6* 8.8* 8.5* 7.7*  PROT 6.5  --   --   --   ALBUMIN 3.2*  --   --   --   AST 59*  --   --   --   ALT 35  --   --   --   ALKPHOS 144*  --   --   --   BILITOT 1.0  --   --   --   GFRNONAA 19* 17* 19* 19*  GFRAA 22* 20* 22* 22*  ANIONGAP 11 9 10 11      Hematology Recent Labs Lab 08/18/16 2210  08/20/16 0425  WBC 7.0 7.3  RBC 2.60* 2.61*  HGB 8.3* 8.2*  HCT 25.1* 25.1*  MCV 96.5 96.2  MCH 31.9 31.4  MCHC 33.1 32.7  RDW 15.9* 15.8*  PLT 252 276    Cardiac Enzymes Recent Labs Lab 08/19/16 1002 08/19/16 1625 08/19/16 2246  TROPONINI 0.06* 0.10* 0.13*    Recent Labs Lab 08/18/16 2227  TROPIPOC 0.03     BNP Recent Labs Lab 08/18/16 2210  BNP 743.0*     DDimer No results for input(s): DDIMER in the last 168 hours.   Radiology    Dg Chest Port 1 View  Result Date: 08/22/2016 CLINICAL DATA:  PICC line placement. EXAM: PORTABLE CHEST 1 VIEW COMPARISON:  08/18/2016 and 07/20/2016 radiographs.  CT 11/27/2015. FINDINGS: 1403 hour. New right arm PICC extends to the superior cavoatrial junction. There is stable cardiomegaly and aortic atherosclerosis. The lungs are clear. There is no pleural effusion or pneumothorax. IMPRESSION: No pneumothorax post PICC line placement. Tip of the line extends to the level of the superior cavoatrial junction. Stable cardiomegaly. Electronically Signed   By: Carey BullocksWilliam  Veazey M.D.   On: 08/22/2016 14:19    Cardiac Studies     Patient Profile       Assessment & Plan    1  Acute on chronic diastolic CHF  Continues to diurese  Pt is breatihng better  Volume is still up some Continue IV lasix today    2  CAD  I am not  conivnced of active angina.  Follow    3  Hx atrial flutter  On coumadin on admit  Need to eval signif Hgb drop before reinitiating coumadin      Signed, Dietrich PatesPaula Mavery Milling, MD  08/22/2016, 3:05 PM

## 2016-08-23 ENCOUNTER — Ambulatory Visit: Payer: Medicare Other | Admitting: Orthopedic Surgery

## 2016-08-23 LAB — BASIC METABOLIC PANEL
Anion gap: 13 (ref 5–15)
BUN: 96 mg/dL — AB (ref 6–20)
CHLORIDE: 99 mmol/L — AB (ref 101–111)
CO2: 26 mmol/L (ref 22–32)
CREATININE: 2.08 mg/dL — AB (ref 0.44–1.00)
Calcium: 7.9 mg/dL — ABNORMAL LOW (ref 8.9–10.3)
GFR calc Af Amer: 24 mL/min — ABNORMAL LOW (ref >60)
GFR calc non Af Amer: 21 mL/min — ABNORMAL LOW (ref >60)
GLUCOSE: 148 mg/dL — AB (ref 65–99)
POTASSIUM: 3.6 mmol/L (ref 3.5–5.1)
SODIUM: 138 mmol/L (ref 135–145)

## 2016-08-23 LAB — GLUCOSE, CAPILLARY
GLUCOSE-CAPILLARY: 176 mg/dL — AB (ref 65–99)
GLUCOSE-CAPILLARY: 212 mg/dL — AB (ref 65–99)
Glucose-Capillary: 119 mg/dL — ABNORMAL HIGH (ref 65–99)
Glucose-Capillary: 192 mg/dL — ABNORMAL HIGH (ref 65–99)
Glucose-Capillary: 305 mg/dL — ABNORMAL HIGH (ref 65–99)

## 2016-08-23 LAB — IRON AND TIBC
IRON: 33 ug/dL (ref 28–170)
SATURATION RATIOS: 9 % — AB (ref 10.4–31.8)
TIBC: 349 ug/dL (ref 250–450)
UIBC: 316 ug/dL

## 2016-08-23 LAB — CBC
HEMATOCRIT: 26.5 % — AB (ref 36.0–46.0)
HEMOGLOBIN: 8.8 g/dL — AB (ref 12.0–15.0)
MCH: 31.8 pg (ref 26.0–34.0)
MCHC: 33.2 g/dL (ref 30.0–36.0)
MCV: 95.7 fL (ref 78.0–100.0)
Platelets: 323 10*3/uL (ref 150–400)
RBC: 2.77 MIL/uL — AB (ref 3.87–5.11)
RDW: 15.3 % (ref 11.5–15.5)
WBC: 7.8 10*3/uL (ref 4.0–10.5)

## 2016-08-23 LAB — PROTIME-INR
INR: 1.58
PROTHROMBIN TIME: 19 s — AB (ref 11.4–15.2)

## 2016-08-23 MED ORDER — WARFARIN - PHARMACIST DOSING INPATIENT
Status: DC
  Administered 2016-08-24 – 2016-09-01 (×7)

## 2016-08-23 MED ORDER — INSULIN ASPART 100 UNIT/ML ~~LOC~~ SOLN
4.0000 [IU] | Freq: Three times a day (TID) | SUBCUTANEOUS | Status: DC
  Administered 2016-08-23 – 2016-09-02 (×26): 4 [IU] via SUBCUTANEOUS

## 2016-08-23 MED ORDER — INSULIN ASPART 100 UNIT/ML ~~LOC~~ SOLN
0.0000 [IU] | Freq: Every day | SUBCUTANEOUS | Status: DC
  Administered 2016-08-23: 4 [IU] via SUBCUTANEOUS

## 2016-08-23 MED ORDER — FUROSEMIDE 10 MG/ML IJ SOLN
80.0000 mg | Freq: Two times a day (BID) | INTRAMUSCULAR | Status: DC
  Administered 2016-08-23 – 2016-08-28 (×10): 80 mg via INTRAVENOUS
  Filled 2016-08-23 (×10): qty 8

## 2016-08-23 MED ORDER — WARFARIN SODIUM 5 MG PO TABS
5.0000 mg | ORAL_TABLET | Freq: Once | ORAL | Status: AC
  Administered 2016-08-23: 5 mg via ORAL
  Filled 2016-08-23: qty 1

## 2016-08-23 MED ORDER — INSULIN ASPART 100 UNIT/ML ~~LOC~~ SOLN
0.0000 [IU] | Freq: Three times a day (TID) | SUBCUTANEOUS | Status: DC
  Administered 2016-08-24: 3 [IU] via SUBCUTANEOUS
  Administered 2016-08-24 (×2): 2 [IU] via SUBCUTANEOUS
  Administered 2016-08-25: 3 [IU] via SUBCUTANEOUS
  Administered 2016-08-26 – 2016-08-27 (×4): 2 [IU] via SUBCUTANEOUS
  Administered 2016-08-28: 3 [IU] via SUBCUTANEOUS
  Administered 2016-08-28 (×2): 2 [IU] via SUBCUTANEOUS
  Administered 2016-08-29 (×2): 3 [IU] via SUBCUTANEOUS
  Administered 2016-08-30: 5 [IU] via SUBCUTANEOUS
  Administered 2016-08-30: 11 [IU] via SUBCUTANEOUS
  Administered 2016-08-31 – 2016-09-01 (×2): 2 [IU] via SUBCUTANEOUS
  Administered 2016-09-01 (×2): 3 [IU] via SUBCUTANEOUS
  Administered 2016-09-02: 2 [IU] via SUBCUTANEOUS
  Administered 2016-09-02: 3 [IU] via SUBCUTANEOUS

## 2016-08-23 NOTE — Consult Note (Signed)
   [facility]   08/23/2016  Carley HammedBettie L Yanes 07/20/1930 [id number]   Referral received from inpatient case manager for [facility] Care Management services and post hospital discharge follow up related to a diagnosis of CHF, DM and 2 admits in the last 30 days. Patient was evaluated for community based chronic disease management services with [facility] as a benefit of patient's [employer] Medicare. Patient requested for me to speak with daughter to explain [facility].  Verbal consent obtained. Daughter gave [phone] as the best number to reach her and requests care manager speak with her daughter who she states is her HCPOA. Patient will receive post hospital discharge calls and be evaluated for monthly home visits. [facility] services does not interfere with or replace any services arranged by the inpatient care management team. Made inpatient RNCM aware that [facility] will be following for care management. For additional questions please contact:   Princella Jaskiewicz RN, BSN [facility]  Hospital Liaison  [phone]) Business Mobile [phone]) Toll free office

## 2016-08-23 NOTE — Progress Notes (Signed)
ANTICOAGULATION CONSULT NOTE  Pharmacy Consult for COUMADIN (home med) Indication: atrial fibrillation  No Known Allergies  Patient Measurements: Height: 5\' 4"  (162.6 cm) Weight: 220 lb 3.8 oz (99.9 kg) IBW/kg (Calculated) : 54.7  Vital Signs: Temp: 97.4 F (36.3 C) (07/17 0756) Temp Source: Oral (07/17 0756) BP: 159/82 (07/17 1000) Pulse Rate: 74 (07/17 1000)  Labs:  Recent Labs  08/21/16 0433 08/22/16 0434 08/23/16 0511  HGB  --   --  8.8*  HCT  --   --  26.5*  PLT  --   --  323  LABPROT 27.9* 23.0* 19.0*  INR 2.55 2.00 1.58  CREATININE 2.29* 2.29* 2.08*    Estimated Creatinine Clearance: 23.6 mL/min (A) (by C-G formula based on SCr of 2.08 mg/dL (H)).  Medical History: Past Medical History:  Diagnosis Date  . Atrial fibrillation (HCC)   . CAD (coronary artery disease)    Multivessel status post high risk PCI/DES to circumflex 11/2015 (poor candidate for CABG)  . CHF (congestive heart failure) (HCC)   . CKD (chronic kidney disease) stage 3, GFR 30-59 ml/min   . DDD (degenerative disc disease), lumbar   . Essential hypertension, benign   . Mixed hyperlipidemia   . Type 2 diabetes mellitus (HCC)    Medications:  Prescriptions Prior to Admission  Medication Sig Dispense Refill Last Dose  . acetaminophen (TYLENOL) 325 MG tablet Take 2 tablets (650 mg total) by mouth every 4 (four) hours as needed for mild pain, moderate pain, fever or headache.   unknown  . ALPRAZolam (XANAX) 0.25 MG tablet Take 0.25 mg by mouth 3 (three) times daily as needed.   unknown  . amLODipine (NORVASC) 2.5 MG tablet TAKE (1) TABLET BY MOUTH DAILY. 30 tablet 6 08/18/2016 at Unknown time  . atorvastatin (LIPITOR) 40 MG tablet Take 1 tablet (40 mg total) by mouth daily. 90 tablet 3 08/18/2016 at Unknown time  . clopidogrel (PLAVIX) 75 MG tablet Take 1 tablet (75 mg total) by mouth daily. 90 tablet 3 08/18/2016 at Unknown time  . glimepiride (AMARYL) 2 MG tablet Take 2 mg by mouth daily.      08/18/2016 at Unknown time  . hydrALAZINE (APRESOLINE) 100 MG tablet Take 1 tablet (100 mg total) by mouth 3 (three) times daily. 270 tablet 3 08/18/2016 at Unknown time  . HYDROcodone-acetaminophen (NORCO/VICODIN) 5-325 MG tablet Take 1 tablet by mouth 4 (four) times daily.    08/18/2016 at Unknown time  . insulin detemir (LEVEMIR) 100 UNIT/ML injection Inject 0.2 mLs (20 Units total) into the skin at bedtime. 10 mL 11 08/17/2016 at Unknown time  . insulin lispro (HUMALOG) 100 UNIT/ML injection Inject 0-0.15 mLs (0-15 Units total) into the skin 3 (three) times daily with meals. Use as sliding scale 10 mL 11 08/18/2016 at Unknown time  . isosorbide mononitrate (IMDUR) 60 MG 24 hr tablet Take 1 tablet (60 mg total) by mouth daily. 90 tablet 3 08/18/2016 at Unknown time  . nitroGLYCERIN (NITROSTAT) 0.4 MG SL tablet Place 1 tablet (0.4 mg total) under the tongue every 5 (five) minutes as needed for chest pain. 25 tablet 2 unknown  . pantoprazole (PROTONIX) 40 MG tablet Take 1 tablet (40 mg total) by mouth daily at 6 (six) AM. 90 tablet 3 08/18/2016 at Unknown time  . PROAIR HFA 108 (90 Base) MCG/ACT inhaler Inhale 1-2 puffs into the lungs 4 (four) times daily.    08/18/2016 at Unknown time  . torsemide (DEMADEX) 20 MG tablet Take 2  tablets (40 mg total) by mouth 2 (two) times daily. 120 tablet 6 08/18/2016 at Unknown time  . TRAVATAN Z 0.004 % SOLN ophthalmic solution Place 1 drop into both eyes at bedtime.    08/17/2016 at Unknown time  . umeclidinium bromide (INCRUSE ELLIPTA) 62.5 MCG/INH AEPB Inhale 1 puff into the lungs daily.   08/18/2016 at Unknown time  . warfarin (COUMADIN) 5 MG tablet TAKE 1 TABLET BY MOUTH DAILY EXCEPT 1/2 TABLET ON WEDNESDAYS AND SATURDAYS. 30 tablet 6 08/17/2016 at 2130   Assessment: 83yo female on chronic Coumadin PTA for h/o afib.  Warfarin previously held due to elevated INR and anemia.  OK to restart today per cardiology.  Goal of Therapy:  INR 2-3 Monitor platelets by  anticoagulation protocol: Yes   Plan:  Coumadin 5mg  today x 1  INR daily Monitor for signs and symptoms of bleeding.   Lamonte Richer R 08/23/2016,10:53 AM

## 2016-08-23 NOTE — Care Management Note (Signed)
Case Management Note  Patient Details  Name: Carley HammedBettie L Casella MRN: [id number] Date of Birth: 1930/12/14   If discussed at Long Length of Stay Meetings, dates discussed:   08/23/2016 Additional Comments:  Emmarie Sannes, Chrystine OilerSharley Diane, RN 08/23/2016, 10:35 AM

## 2016-08-23 NOTE — Care Management Important Message (Signed)
Important Message  Patient Details  Name: Melissa [name] MRN: [id number] Date of Birth: Feb 23, 1930   Medicare Important Message Given:  Yes    Raymond Bhardwaj, Chrystine OilerSharley Diane, RN 08/23/2016, 4:17 PM

## 2016-08-23 NOTE — Progress Notes (Signed)
Subjective: She says she feels okay. She is still significantly volume overloaded. Renal function is holding. She has no other new complaints. Her blood sugar is too high.  Objective: Vital signs in last 24 hours: Temp:  [97.4 F (36.3 C)-99.7 F (37.6 C)] 97.4 F (36.3 C) (07/17 0756) Pulse Rate:  [67-90] 67 (07/17 0847) Resp:  [12-26] 12 (07/17 0847) BP: (131-160)/(67-132) 152/132 (07/17 0800) SpO2:  [95 %-98 %] 97 % (07/17 0847) Weight:  [99.9 kg (220 lb 3.8 oz)] 99.9 kg (220 lb 3.8 oz) (07/17 0500) Weight change: -1.8 kg (-3 lb 15.5 oz) Last BM Date: 08/21/16  Intake/Output from previous day: 07/16 0701 - 07/17 0700 In: 480 [P.O.:480] Out: 4400 [Urine:4400]  PHYSICAL EXAM General appearance: alert, cooperative, mild distress and Very hard of hearing Resp: rales bibasilar Cardio: irregularly irregular rhythm GI: soft, non-tender; bowel sounds normal; no masses,  no organomegaly Extremities: She still has edema of her legs and her arms Skin warm and dry  Lab Results:  Results for orders placed or performed during the hospital encounter of 08/18/16 (from the past 48 hour(s))  Glucose, capillary     Status: Abnormal   Collection Time: 08/21/16 11:27 AM  Result Value Ref Range   Glucose-Capillary 178 (H) 65 - 99 mg/dL  Glucose, capillary     Status: Abnormal   Collection Time: 08/21/16  4:21 PM  Result Value Ref Range   Glucose-Capillary 204 (H) 65 - 99 mg/dL  Glucose, capillary     Status: Abnormal   Collection Time: 08/21/16  8:01 PM  Result Value Ref Range   Glucose-Capillary 267 (H) 65 - 99 mg/dL  Glucose, capillary     Status: Abnormal   Collection Time: 08/22/16 12:19 AM  Result Value Ref Range   Glucose-Capillary 241 (H) 65 - 99 mg/dL  Glucose, capillary     Status: Abnormal   Collection Time: 08/22/16  3:27 AM  Result Value Ref Range   Glucose-Capillary 243 (H) 65 - 99 mg/dL  Basic metabolic panel     Status: Abnormal   Collection Time: 08/22/16  4:34 AM   Result Value Ref Range   Sodium 135 135 - 145 mmol/L   Potassium 4.1 3.5 - 5.1 mmol/L    Comment: DELTA CHECK NOTED   Chloride 99 (L) 101 - 111 mmol/L   CO2 25 22 - 32 mmol/L   Glucose, Bld 245 (H) 65 - 99 mg/dL   BUN 97 (H) 6 - 20 mg/dL   Creatinine, Ser 2.71 (H) 0.44 - 1.00 mg/dL   Calcium 7.7 (L) 8.9 - 10.3 mg/dL   GFR calc non Af Amer 19 (L) >60 mL/min   GFR calc Af Amer 22 (L) >60 mL/min    Comment: (NOTE) The eGFR has been calculated using the CKD EPI equation. This calculation has not been validated in all clinical situations. eGFR's persistently <60 mL/min signify possible Chronic Kidney Disease.    Anion gap 11 5 - 15  Protime-INR     Status: Abnormal   Collection Time: 08/22/16  4:34 AM  Result Value Ref Range   Prothrombin Time 23.0 (H) 11.4 - 15.2 seconds   INR 2.00   Glucose, capillary     Status: Abnormal   Collection Time: 08/22/16  7:09 AM  Result Value Ref Range   Glucose-Capillary 159 (H) 65 - 99 mg/dL  Glucose, capillary     Status: Abnormal   Collection Time: 08/22/16 11:18 AM  Result Value Ref Range   Glucose-Capillary  173 (H) 65 - 99 mg/dL  Glucose, capillary     Status: Abnormal   Collection Time: 08/22/16  4:08 PM  Result Value Ref Range   Glucose-Capillary 288 (H) 65 - 99 mg/dL  Glucose, capillary     Status: Abnormal   Collection Time: 08/22/16  7:39 PM  Result Value Ref Range   Glucose-Capillary 316 (H) 65 - 99 mg/dL   Comment 1 Notify RN   Glucose, capillary     Status: Abnormal   Collection Time: 08/22/16 11:56 PM  Result Value Ref Range   Glucose-Capillary 294 (H) 65 - 99 mg/dL   Comment 1 Notify RN   Glucose, capillary     Status: Abnormal   Collection Time: 08/23/16  3:54 AM  Result Value Ref Range   Glucose-Capillary 176 (H) 65 - 99 mg/dL   Comment 1 Notify RN   Basic metabolic panel     Status: Abnormal   Collection Time: 08/23/16  5:11 AM  Result Value Ref Range   Sodium 138 135 - 145 mmol/L   Potassium 3.6 3.5 - 5.1 mmol/L    Chloride 99 (L) 101 - 111 mmol/L   CO2 26 22 - 32 mmol/L   Glucose, Bld 148 (H) 65 - 99 mg/dL   BUN 96 (H) 6 - 20 mg/dL   Creatinine, Ser 2.08 (H) 0.44 - 1.00 mg/dL   Calcium 7.9 (L) 8.9 - 10.3 mg/dL   GFR calc non Af Amer 21 (L) >60 mL/min   GFR calc Af Amer 24 (L) >60 mL/min    Comment: (NOTE) The eGFR has been calculated using the CKD EPI equation. This calculation has not been validated in all clinical situations. eGFR's persistently <60 mL/min signify possible Chronic Kidney Disease.    Anion gap 13 5 - 15  Protime-INR     Status: Abnormal   Collection Time: 08/23/16  5:11 AM  Result Value Ref Range   Prothrombin Time 19.0 (H) 11.4 - 15.2 seconds   INR 1.58   CBC     Status: Abnormal   Collection Time: 08/23/16  5:11 AM  Result Value Ref Range   WBC 7.8 4.0 - 10.5 K/uL   RBC 2.77 (L) 3.87 - 5.11 MIL/uL   Hemoglobin 8.8 (L) 12.0 - 15.0 g/dL   HCT 26.5 (L) 36.0 - 46.0 %   MCV 95.7 78.0 - 100.0 fL   MCH 31.8 26.0 - 34.0 pg   MCHC 33.2 30.0 - 36.0 g/dL   RDW 15.3 11.5 - 15.5 %   Platelets 323 150 - 400 K/uL  Glucose, capillary     Status: Abnormal   Collection Time: 08/23/16  7:24 AM  Result Value Ref Range   Glucose-Capillary 119 (H) 65 - 99 mg/dL   Comment 1 Notify RN    Comment 2 Document in Chart     ABGS No results for input(s): PHART, PO2ART, TCO2, HCO3 in the last 72 hours.  Invalid input(s): PCO2 CULTURES Recent Results (from the past 240 hour(s))  MRSA PCR Screening     Status: None   Collection Time: 08/19/16  3:44 PM  Result Value Ref Range Status   MRSA by PCR NEGATIVE NEGATIVE Final    Comment:        The GeneXpert MRSA Assay (FDA approved for NASAL specimens only), is one component of a comprehensive MRSA colonization surveillance program. It is not intended to diagnose MRSA infection nor to guide or monitor treatment for MRSA infections.    Studies/Results:  Dg Chest Port 1 View  Result Date: 08/22/2016 CLINICAL DATA:  PICC line  placement. EXAM: PORTABLE CHEST 1 VIEW COMPARISON:  08/18/2016 and 07/20/2016 radiographs.  CT 11/27/2015. FINDINGS: 1403 hour. New right arm PICC extends to the superior cavoatrial junction. There is stable cardiomegaly and aortic atherosclerosis. The lungs are clear. There is no pleural effusion or pneumothorax. IMPRESSION: No pneumothorax post PICC line placement. Tip of the line extends to the level of the superior cavoatrial junction. Stable cardiomegaly. Electronically Signed   By: Richardean Sale M.D.   On: 08/22/2016 14:19    Medications:  Prior to Admission:  Prescriptions Prior to Admission  Medication Sig Dispense Refill Last Dose  . acetaminophen (TYLENOL) 325 MG tablet Take 2 tablets (650 mg total) by mouth every 4 (four) hours as needed for mild pain, moderate pain, fever or headache.   unknown  . ALPRAZolam (XANAX) 0.25 MG tablet Take 0.25 mg by mouth 3 (three) times daily as needed.   unknown  . amLODipine (NORVASC) 2.5 MG tablet TAKE (1) TABLET BY MOUTH DAILY. 30 tablet 6 08/18/2016 at Unknown time  . atorvastatin (LIPITOR) 40 MG tablet Take 1 tablet (40 mg total) by mouth daily. 90 tablet 3 08/18/2016 at Unknown time  . clopidogrel (PLAVIX) 75 MG tablet Take 1 tablet (75 mg total) by mouth daily. 90 tablet 3 08/18/2016 at Unknown time  . glimepiride (AMARYL) 2 MG tablet Take 2 mg by mouth daily.     08/18/2016 at Unknown time  . hydrALAZINE (APRESOLINE) 100 MG tablet Take 1 tablet (100 mg total) by mouth 3 (three) times daily. 270 tablet 3 08/18/2016 at Unknown time  . HYDROcodone-acetaminophen (NORCO/VICODIN) 5-325 MG tablet Take 1 tablet by mouth 4 (four) times daily.    08/18/2016 at Unknown time  . insulin detemir (LEVEMIR) 100 UNIT/ML injection Inject 0.2 mLs (20 Units total) into the skin at bedtime. 10 mL 11 08/17/2016 at Unknown time  . insulin lispro (HUMALOG) 100 UNIT/ML injection Inject 0-0.15 mLs (0-15 Units total) into the skin 3 (three) times daily with meals. Use as sliding  scale 10 mL 11 08/18/2016 at Unknown time  . isosorbide mononitrate (IMDUR) 60 MG 24 hr tablet Take 1 tablet (60 mg total) by mouth daily. 90 tablet 3 08/18/2016 at Unknown time  . nitroGLYCERIN (NITROSTAT) 0.4 MG SL tablet Place 1 tablet (0.4 mg total) under the tongue every 5 (five) minutes as needed for chest pain. 25 tablet 2 unknown  . pantoprazole (PROTONIX) 40 MG tablet Take 1 tablet (40 mg total) by mouth daily at 6 (six) AM. 90 tablet 3 08/18/2016 at Unknown time  . PROAIR HFA 108 (90 Base) MCG/ACT inhaler Inhale 1-2 puffs into the lungs 4 (four) times daily.    08/18/2016 at Unknown time  . torsemide (DEMADEX) 20 MG tablet Take 2 tablets (40 mg total) by mouth 2 (two) times daily. 120 tablet 6 08/18/2016 at Unknown time  . TRAVATAN Z 0.004 % SOLN ophthalmic solution Place 1 drop into both eyes at bedtime.    08/17/2016 at Unknown time  . umeclidinium bromide (INCRUSE ELLIPTA) 62.5 MCG/INH AEPB Inhale 1 puff into the lungs daily.   08/18/2016 at Unknown time  . warfarin (COUMADIN) 5 MG tablet TAKE 1 TABLET BY MOUTH DAILY EXCEPT 1/2 TABLET ON WEDNESDAYS AND SATURDAYS. 30 tablet 6 08/17/2016 at 2130   Scheduled: . amLODipine  2.5 mg Oral Daily  . atorvastatin  40 mg Oral q1800  . clopidogrel  75 mg Oral Daily  .  furosemide  80 mg Intravenous Q8H  . glimepiride  2 mg Oral Daily  . hydrALAZINE  100 mg Oral TID  . insulin aspart  0-15 Units Subcutaneous Q4H  . insulin detemir  15 Units Subcutaneous QHS  . ipratropium-albuterol  3 mL Nebulization TID  . isosorbide mononitrate  60 mg Oral Daily  . latanoprost  1 drop Both Eyes QHS  . methylPREDNISolone (SOLU-MEDROL) injection  60 mg Intravenous Q12H  . nitroGLYCERIN  0.5 inch Topical Q6H  . pantoprazole  40 mg Oral Q0600  . sodium chloride flush  3 mL Intravenous Q12H   Continuous: . sodium chloride     LMB:EMLJQG chloride, acetaminophen, albuterol, ALPRAZolam, metoprolol tartrate, morphine injection, ondansetron (ZOFRAN) IV, sodium chloride  flush  Assesment: She has acute on chronic diastolic heart failure. She is diuresing but still has significant volume overload. She has persistent atrial fib. She's anemic and that's being worked up. She has chronic kidney disease and that may be the source of her anemia. Principal Problem:   Acute on chronic diastolic CHF (congestive heart failure) (HCC) Active Problems:   Essential hypertension, benign   Persistent atrial fibrillation (HCC)   Long term current use of anticoagulant   Chronic kidney disease (CKD), stage IV (severe) (HCC)   Status post coronary artery stent placement   Diabetes mellitus type 2, controlled ([place])   Acute on chronic diastolic (congestive) heart failure ([place])    Plan: Add NovoLog to meals. Continue other treatments. Continue diuresis.    LOS: 4 days   Mitsuye Schrodt L 08/23/2016, 8:57 AM

## 2016-08-23 NOTE — Progress Notes (Signed)
Inpatient Diabetes Program Recommendations  AACE/ADA: New Consensus Statement on Inpatient Glycemic Control (2015)  Target Ranges:  Prepandial:   less than 140 mg/dL      Peak postprandial:   less than 180 mg/dL (1-2 hours)      Critically ill patients:  140 - 180 mg/dL   Results for Melissa [name], Melissa L (MRN [id number]) as of 08/23/2016 08:06  Ref. Range 08/22/2016 07:09 08/22/2016 11:18 08/22/2016 16:08 08/22/2016 19:39 08/22/2016 23:56 08/23/2016 03:54 08/23/2016 07:24  Glucose-Capillary Latest Ref Range: 65 - 99 mg/dL 409159 (H) 811173 (H) 914288 (H) 316 (H) 294 (H) 176 (H) 119 (H)   Review of Glycemic Control  Current orders for Inpatient glycemic control: Levemir 15 units QHS, Novolog 0-15 units Q4H, Amaryl 2 mg daily  Inpatient Diabetes Program Recommendations: Insulin - Meal Coverage: If steroids are continued, please consider ordering Novolog 4 units TID with meals for meal coverage if patient eats at least 50% of meals.  Thanks, Orlando PennerMarie Nickolus Wadding, RN, MSN, CDE Diabetes Coordinator Inpatient Diabetes Program [phone] (Team Pager from 8am to 5pm)

## 2016-08-23 NOTE — Progress Notes (Signed)
Progress Note  Patient Name: Melissa [name] Date of Encounter: 08/23/2016  Primary Cardiologist: Diona Browner  Subjective  NO complaints of chest pain or dyspnea    Inpatient Medications    Scheduled Meds: . amLODipine  2.5 mg Oral Daily  . atorvastatin  40 mg Oral q1800  . clopidogrel  75 mg Oral Daily  . furosemide  80 mg Intravenous Q8H  . glimepiride  2 mg Oral Daily  . hydrALAZINE  100 mg Oral TID  . insulin aspart  0-15 Units Subcutaneous Q4H  . insulin detemir  15 Units Subcutaneous QHS  . ipratropium-albuterol  3 mL Nebulization TID  . isosorbide mononitrate  60 mg Oral Daily  . latanoprost  1 drop Both Eyes QHS  . methylPREDNISolone (SOLU-MEDROL) injection  60 mg Intravenous Q12H  . nitroGLYCERIN  0.5 inch Topical Q6H  . pantoprazole  40 mg Oral Q0600  . sodium chloride flush  3 mL Intravenous Q12H   Continuous Infusions: . sodium chloride     PRN Meds: sodium chloride, acetaminophen, albuterol, ALPRAZolam, metoprolol tartrate, morphine injection, ondansetron (ZOFRAN) IV, sodium chloride flush   Vital Signs    Vitals:   08/23/16 0700 08/23/16 0756 08/23/16 0800 08/23/16 0847  BP: (!) 155/87  (!) 152/132   Pulse: 78  90 67  Resp: 14  (!) 23 12  Temp:  (!) 97.4 F (36.3 C)    TempSrc:  Oral    SpO2: 97%  95% 97%  Weight:      Height:        Intake/Output Summary (Last 24 hours) at 08/23/16 0855 Last data filed at 08/23/16 0500  Gross per 24 hour  Intake              480 ml  Output             4400 ml  Net            -3920 ml   Filed Weights   08/21/16 0500 08/22/16 0500 08/23/16 0500  Weight: 223 lb 12.3 oz (101.5 kg) 224 lb 3.3 oz (101.7 kg) 220 lb 3.8 oz (99.9 kg)    Telemetry    Atrial fib, some missed beats. HR in the 70;s.   Physical Exam   GEN: No acute distress.   Neck: No JVD Cardiac: IRRR, 1/6 systolic murmur, rubs, or gallops.  Respiratory: Clear to auscultation bilaterally. GI: Soft, nontender, non-distended  MS: 1+-2+  pretibial edema; soreness with palpation. No deformity. Neuro:  Nonfocal  Psych: Normal affect   Labs    Chemistry Recent Labs Lab 08/18/16 2210  08/21/16 0433 08/22/16 0434 08/23/16 0511  NA 133*  < > 135 135 138  K 5.1  < > 5.6* 4.1 3.6  CL 98*  < > 102 99* 99*  CO2 24  < > 23 25 26   GLUCOSE 106*  < > 162* 245* 148*  BUN 70*  < > 92* 97* 96*  CREATININE 2.27*  < > 2.29* 2.29* 2.08*  CALCIUM 8.6*  < > 8.5* 7.7* 7.9*  PROT 6.5  --   --   --   --   ALBUMIN 3.2*  --   --   --   --   AST 59*  --   --   --   --   ALT 35  --   --   --   --   ALKPHOS 144*  --   --   --   --  BILITOT 1.0  --   --   --   --   GFRNONAA 19*  < > 19* 19* 21*  GFRAA 22*  < > 22* 22* 24*  ANIONGAP 11  < > 10 11 13   < > = values in this interval not displayed.   Hematology Recent Labs Lab 08/18/16 2210 08/20/16 0425 08/23/16 0511  WBC 7.0 7.3 7.8  RBC 2.60* 2.61* 2.77*  HGB 8.3* 8.2* 8.8*  HCT 25.1* 25.1* 26.5*  MCV 96.5 96.2 95.7  MCH 31.9 31.4 31.8  MCHC 33.1 32.7 33.2  RDW 15.9* 15.8* 15.3  PLT 252 276 323    Cardiac Enzymes Recent Labs Lab 08/19/16 1002 08/19/16 1625 08/19/16 2246  TROPONINI 0.06* 0.10* 0.13*    Recent Labs Lab 08/18/16 2227  TROPIPOC 0.03     BNP Recent Labs Lab 08/18/16 2210  BNP 743.0*     DDimer No results for input(s): DDIMER in the last 168 hours.   Radiology    Dg Chest Port 1 View  Result Date: 08/22/2016 CLINICAL DATA:  PICC line placement. EXAM: PORTABLE CHEST 1 VIEW COMPARISON:  08/18/2016 and 07/20/2016 radiographs.  CT 11/27/2015. FINDINGS: 1403 hour. New right arm PICC extends to the superior cavoatrial junction. There is stable cardiomegaly and aortic atherosclerosis. The lungs are clear. There is no pleural effusion or pneumothorax. IMPRESSION: No pneumothorax post PICC line placement. Tip of the line extends to the level of the superior cavoatrial junction. Stable cardiomegaly. Electronically Signed   By: Carey BullocksWilliam  Veazey M.D.   On:  08/22/2016 14:19    Cardiac Studies   Echocardiogram 08/19/2016 Left ventricle: The cavity size was normal. Wall thickness was   increased in a pattern of moderate LVH. Systolic function was   normal. The estimated ejection fraction was in the range of 60%   to 65%. - Aortic valve: Moderately calcified annulus. Trileaflet;   moderately thickened leaflets. Valve area (VTI): 2.11 cm^2. Valve   area (Vmax): 1.68 cm^2. Valve area (Vmean): 1.61 cm^2. - Mitral valve: There was mild regurgitation. - Left atrium: The atrium was massively dilated. - Right ventricle: The ventricular septum is flattened in systole   and diastole suggesting RV pressure and volume overload. The   cavity size was moderately dilated. - Right atrium: The atrium was massively dilated. - Atrial septum: No defect or patent foramen ovale was identified. - Tricuspid valve: There was moderate-severe regurgitation. There   is hepatic systolic flow reversal sugesting severe TR. - Pulmonary arteries: Systolic pressure was severely increased. PA   peak pressure: 77 mm Hg (S). - Technically adequate study.  Patient Profile     83 y.o. female  hx of Chronic diastolic heart failure, chronic atrial fibrillation on coumadin, coronary artery disease with high risk DES intervention to the circumflex in 11/2015, chronic kidney disease stage III who is being seen today for the evaluation of chest pain and dyspnea at the request of Dr. [name]. Diagnosed with acute on chronic diastolic CHF.   Assessment & Plan    1. Atrial fib: Heart rate is controlled. Having some missed beats, no sustained pauses. No AV nodal blocking agents. On coumadin per pharmacy. Had supratherapuetic INR on admission. INR now 1.58. Now on hold due to anemia. Repeat CBC Hgb 8.8. Restart coumadin or consider renal dose of Eliquis.   2. Acute on Chronic Diastolic CHF: Has diuresed >9 liters and is now on IV lasix 80 mg IV q 8 hours. Weights are labile, ranging from  220 lbs to 224 lbs throughout her admission. Continues to have some evidence of fluid overload while supine.    3. CAD: DES to to circumflex: Continues on Plavix. No ASA as she is on Coumadin. No active bleeding or angina.   4. Hypertension: Elevated this am, has been labile between 130's systolic, and 150's systolic.  On amlodipine, hydralazine, and isosorbide mononitrate. Consider increasing amlodipine to 5 mg daily.   5. CKD: Stage III: Creatinine 2.08 today. Will decrease lasix to 80 mg BID.   5. Anemia: Hgb is improved off of coumadin.     Signed, Bettey Mare. Liborio Nixon, ANP, AACC   08/23/2016, 8:55 AM    The patient was seen and examined, and I agree with the history, physical exam, assessment and plan as documented above, with modifications as noted below.  I reviewed Dr. Verna Czech and Dr. Charlott Rakes notes, as well as Dr. Juanetta Gosling' notes.  She has steadily been retaining fluid and oral diuretics were adjusted by Dr. Diona Browner earlier this month. She is now on IV diuretics with markedly elevated BUN.  She has been on IV methylprednisolone since admission but she appears to have acute on chronic diastolic heart failure rather than a COPD exacerbation. I have stopped IV steroids as this will only serve to propagate fluid retention and aggravate hypertension. She is hypertensive.  Agree with IV diuretic dose reduction to furosemide 80 mg bid.  Also agree with restarting warfarin.  Prentice Docker, MD, [facility]  08/23/2016 10:29 AM

## 2016-08-23 NOTE — Care Management Note (Addendum)
Case Management Note  Patient Details  Name: Melissa HammedBettie L Stice MRN: [id number] Date of Birth: Jan 10, 1931  Subjective/Objective: Adm with Acute on chronic diastolic heart failure.  From home with family. Has aide for couple of hours a day, daughter or grandaughter are always with patient the rest of the time. Patient walks with RW. [facility] completed to due not being able to reach patient. Discussed this with patient's daughter [name], she request that calls come to her phone [phone]) and [facility]. [facility]. Daughter states patient had been compliant with diet and medications. But admits patient doesn't always weigh daily.    Action/Plan: Alroy BailiffLinda Lothian of [facility] notified and will obtain [facility]. Will order EMMI transition calls if deemed appropriate for calls to go to daughter's phone. CM will discuss with [facility],  ADDENDUM: After discussing with [facility], [facility] and we will not do EMMI discharge calls.   Expected Discharge Date:  08/21/16               Expected Discharge Plan:  Home w Home Health Services  In-House Referral:     Discharge planning Services  CM Consult  Post Acute Care Choice:    Choice offered to:  Patient, Adult Children  DME Arranged:    DME Agency:     HH Arranged:  RN HH Agency:  [facility]  Status of Service:  Completed, signed off  If discussed at Long Length of Stay Meetings, dates discussed:    Additional Comments:  Roderick Sweezy, Chrystine OilerSharley Diane, RN 08/23/2016, 4:01 PM

## 2016-08-24 ENCOUNTER — Other Ambulatory Visit: Payer: Self-pay | Admitting: *Deleted

## 2016-08-24 DIAGNOSIS — I25118 Atherosclerotic heart disease of native coronary artery with other forms of angina pectoris: Secondary | ICD-10-CM

## 2016-08-24 LAB — BASIC METABOLIC PANEL
ANION GAP: 11 (ref 5–15)
ANION GAP: 11 (ref 5–15)
BUN: 88 mg/dL — ABNORMAL HIGH (ref 6–20)
BUN: 92 mg/dL — ABNORMAL HIGH (ref 6–20)
CALCIUM: 7.8 mg/dL — AB (ref 8.9–10.3)
CO2: 27 mmol/L (ref 22–32)
CO2: 28 mmol/L (ref 22–32)
Calcium: 7.7 mg/dL — ABNORMAL LOW (ref 8.9–10.3)
Chloride: 100 mmol/L — ABNORMAL LOW (ref 101–111)
Chloride: 99 mmol/L — ABNORMAL LOW (ref 101–111)
Creatinine, Ser: 1.89 mg/dL — ABNORMAL HIGH (ref 0.44–1.00)
Creatinine, Ser: 2.08 mg/dL — ABNORMAL HIGH (ref 0.44–1.00)
GFR calc Af Amer: 27 mL/min — ABNORMAL LOW (ref >60)
GFR calc non Af Amer: 23 mL/min — ABNORMAL LOW (ref >60)
GFR, EST AFRICAN AMERICAN: 24 mL/min — AB (ref >60)
GFR, EST NON AFRICAN AMERICAN: 21 mL/min — AB (ref >60)
GLUCOSE: 201 mg/dL — AB (ref 65–99)
Glucose, Bld: 322 mg/dL — ABNORMAL HIGH (ref 65–99)
POTASSIUM: 3.8 mmol/L (ref 3.5–5.1)
Potassium: 3.7 mmol/L (ref 3.5–5.1)
SODIUM: 137 mmol/L (ref 135–145)
Sodium: 139 mmol/L (ref 135–145)

## 2016-08-24 LAB — GLUCOSE, CAPILLARY
GLUCOSE-CAPILLARY: 165 mg/dL — AB (ref 65–99)
GLUCOSE-CAPILLARY: 181 mg/dL — AB (ref 65–99)
Glucose-Capillary: 149 mg/dL — ABNORMAL HIGH (ref 65–99)
Glucose-Capillary: 142 mg/dL — ABNORMAL HIGH (ref 65–99)

## 2016-08-24 LAB — PROTIME-INR
INR: 1.45
Prothrombin Time: 17.8 seconds — ABNORMAL HIGH (ref 11.4–15.2)

## 2016-08-24 MED ORDER — WARFARIN SODIUM 5 MG PO TABS
5.0000 mg | ORAL_TABLET | Freq: Once | ORAL | Status: AC
  Administered 2016-08-24: 5 mg via ORAL
  Filled 2016-08-24: qty 1

## 2016-08-24 MED ORDER — MAGNESIUM HYDROXIDE 400 MG/5ML PO SUSP
30.0000 mL | Freq: Every day | ORAL | Status: DC | PRN
  Administered 2016-08-24: 30 mL via ORAL
  Filled 2016-08-24: qty 30

## 2016-08-24 NOTE — Progress Notes (Signed)
ANTICOAGULATION CONSULT NOTE  Pharmacy Consult for COUMADIN (home med) Indication: atrial fibrillation  No Known Allergies  Patient Measurements: Height: 5\' 4"  (162.6 cm) Weight: 220 lb 0.3 oz (99.8 kg) IBW/kg (Calculated) : 54.7  Vital Signs: Temp: 98.4 F (36.9 C) (07/18 0838) Temp Source: Oral (07/18 0838) BP: 132/56 (07/18 0900) Pulse Rate: 74 (07/18 0900)  Labs:  Recent Labs  08/22/16 0434 08/23/16 0511 08/24/16 0004 08/24/16 0551  HGB  --  8.8*  --   --   HCT  --  26.5*  --   --   PLT  --  323  --   --   LABPROT 23.0* 19.0*  --  17.8*  INR 2.00 1.58  --  1.45  CREATININE 2.29* 2.08* 2.08* 1.89*    Estimated Creatinine Clearance: 25.9 mL/min (A) (by C-G formula based on SCr of 1.89 mg/dL (H)).  Medical History: Past Medical History:  Diagnosis Date  . Atrial fibrillation (HCC)   . CAD (coronary artery disease)    Multivessel status post high risk PCI/DES to circumflex 11/2015 (poor candidate for CABG)  . CHF (congestive heart failure) (HCC)   . CKD (chronic kidney disease) stage 3, GFR 30-59 ml/min   . DDD (degenerative disc disease), lumbar   . Essential hypertension, benign   . Mixed hyperlipidemia   . Type 2 diabetes mellitus (HCC)    Medications:  Prescriptions Prior to Admission  Medication Sig Dispense Refill Last Dose  . acetaminophen (TYLENOL) 325 MG tablet Take 2 tablets (650 mg total) by mouth every 4 (four) hours as needed for mild pain, moderate pain, fever or headache.   unknown  . ALPRAZolam (XANAX) 0.25 MG tablet Take 0.25 mg by mouth 3 (three) times daily as needed.   unknown  . amLODipine (NORVASC) 2.5 MG tablet TAKE (1) TABLET BY MOUTH DAILY. 30 tablet 6 08/18/2016 at Unknown time  . atorvastatin (LIPITOR) 40 MG tablet Take 1 tablet (40 mg total) by mouth daily. 90 tablet 3 08/18/2016 at Unknown time  . clopidogrel (PLAVIX) 75 MG tablet Take 1 tablet (75 mg total) by mouth daily. 90 tablet 3 08/18/2016 at Unknown time  . glimepiride  (AMARYL) 2 MG tablet Take 2 mg by mouth daily.     08/18/2016 at Unknown time  . hydrALAZINE (APRESOLINE) 100 MG tablet Take 1 tablet (100 mg total) by mouth 3 (three) times daily. 270 tablet 3 08/18/2016 at Unknown time  . HYDROcodone-acetaminophen (NORCO/VICODIN) 5-325 MG tablet Take 1 tablet by mouth 4 (four) times daily.    08/18/2016 at Unknown time  . insulin detemir (LEVEMIR) 100 UNIT/ML injection Inject 0.2 mLs (20 Units total) into the skin at bedtime. 10 mL 11 08/17/2016 at Unknown time  . insulin lispro (HUMALOG) 100 UNIT/ML injection Inject 0-0.15 mLs (0-15 Units total) into the skin 3 (three) times daily with meals. Use as sliding scale 10 mL 11 08/18/2016 at Unknown time  . isosorbide mononitrate (IMDUR) 60 MG 24 hr tablet Take 1 tablet (60 mg total) by mouth daily. 90 tablet 3 08/18/2016 at Unknown time  . nitroGLYCERIN (NITROSTAT) 0.4 MG SL tablet Place 1 tablet (0.4 mg total) under the tongue every 5 (five) minutes as needed for chest pain. 25 tablet 2 unknown  . pantoprazole (PROTONIX) 40 MG tablet Take 1 tablet (40 mg total) by mouth daily at 6 (six) AM. 90 tablet 3 08/18/2016 at Unknown time  . PROAIR HFA 108 (90 Base) MCG/ACT inhaler Inhale 1-2 puffs into the lungs 4 (four)  times daily.    08/18/2016 at Unknown time  . torsemide (DEMADEX) 20 MG tablet Take 2 tablets (40 mg total) by mouth 2 (two) times daily. 120 tablet 6 08/18/2016 at Unknown time  . TRAVATAN Z 0.004 % SOLN ophthalmic solution Place 1 drop into both eyes at bedtime.    08/17/2016 at Unknown time  . umeclidinium bromide (INCRUSE ELLIPTA) 62.5 MCG/INH AEPB Inhale 1 puff into the lungs daily.   08/18/2016 at Unknown time  . warfarin (COUMADIN) 5 MG tablet TAKE 1 TABLET BY MOUTH DAILY EXCEPT 1/2 TABLET ON WEDNESDAYS AND SATURDAYS. 30 tablet 6 08/17/2016 at 2130   Assessment: 83yo female on chronic Coumadin PTA for h/o afib.  Warfarin previously held due to elevated INR and anemia.  OK to restart today per cardiology on 7/17.   INR below goal again today.  Goal of Therapy:  INR 2-3 Monitor platelets by anticoagulation protocol: Yes   Plan:  Repeat Coumadin 5mg  today x 1  INR daily Monitor for signs and symptoms of bleeding.   [name], Zymiere Trostle R 08/24/2016,11:00 AM

## 2016-08-24 NOTE — Progress Notes (Signed)
Subjective: She says she feels a little better. No new complaints. She still has volume overload but says she feels better.  Objective: Vital signs in last 24 hours: Temp:  [98.3 F (36.8 C)-98.5 F (36.9 C)] 98.4 F (36.9 C) (07/18 0100) Pulse Rate:  [67-89] 69 (07/18 0100) Resp:  [11-34] 11 (07/18 0100) BP: (123-159)/(61-85) 140/64 (07/18 0100) SpO2:  [89 %-98 %] 92 % (07/18 0100) Weight:  [99.8 kg (220 lb 0.3 oz)] 99.8 kg (220 lb 0.3 oz) (07/18 0500) Weight change: -0.1 kg (-3.5 oz) Last BM Date: 08/21/16  Intake/Output from previous day: 07/17 0701 - 07/18 0700 In: 480 [P.O.:480] Out: 2950 [Urine:2950]  PHYSICAL EXAM General appearance: alert, cooperative and no distress Resp: rales bilaterally Cardio: irregularly irregular rhythm GI: soft, non-tender; bowel sounds normal; no masses,  no organomegaly Extremities: She still has swelling of her legs in dependent edema in her arms Skin warm and dry. She is very hard of hearing  Lab Results:  Results for orders placed or performed during the hospital encounter of 08/18/16 (from the past 48 hour(s))  Glucose, capillary     Status: Abnormal   Collection Time: 08/22/16 11:18 AM  Result Value Ref Range   Glucose-Capillary 173 (H) 65 - 99 mg/dL  Glucose, capillary     Status: Abnormal   Collection Time: 08/22/16  4:08 PM  Result Value Ref Range   Glucose-Capillary 288 (H) 65 - 99 mg/dL  Glucose, capillary     Status: Abnormal   Collection Time: 08/22/16  7:39 PM  Result Value Ref Range   Glucose-Capillary 316 (H) 65 - 99 mg/dL   Comment 1 Notify RN   Glucose, capillary     Status: Abnormal   Collection Time: 08/22/16 11:56 PM  Result Value Ref Range   Glucose-Capillary 294 (H) 65 - 99 mg/dL   Comment 1 Notify RN   Glucose, capillary     Status: Abnormal   Collection Time: 08/23/16  3:54 AM  Result Value Ref Range   Glucose-Capillary 176 (H) 65 - 99 mg/dL   Comment 1 Notify RN   Basic metabolic panel     Status:  Abnormal   Collection Time: 08/23/16  5:11 AM  Result Value Ref Range   Sodium 138 135 - 145 mmol/L   Potassium 3.6 3.5 - 5.1 mmol/L   Chloride 99 (L) 101 - 111 mmol/L   CO2 26 22 - 32 mmol/L   Glucose, Bld 148 (H) 65 - 99 mg/dL   BUN 96 (H) 6 - 20 mg/dL   Creatinine, Ser 2.08 (H) 0.44 - 1.00 mg/dL   Calcium 7.9 (L) 8.9 - 10.3 mg/dL   GFR calc non Af Amer 21 (L) >60 mL/min   GFR calc Af Amer 24 (L) >60 mL/min    Comment: (NOTE) The eGFR has been calculated using the CKD EPI equation. This calculation has not been validated in all clinical situations. eGFR's persistently <60 mL/min signify possible Chronic Kidney Disease.    Anion gap 13 5 - 15  Protime-INR     Status: Abnormal   Collection Time: 08/23/16  5:11 AM  Result Value Ref Range   Prothrombin Time 19.0 (H) 11.4 - 15.2 seconds   INR 1.58   CBC     Status: Abnormal   Collection Time: 08/23/16  5:11 AM  Result Value Ref Range   WBC 7.8 4.0 - 10.5 K/uL   RBC 2.77 (L) 3.87 - 5.11 MIL/uL   Hemoglobin 8.8 (L) 12.0 -  15.0 g/dL   HCT 26.5 (L) 36.0 - 46.0 %   MCV 95.7 78.0 - 100.0 fL   MCH 31.8 26.0 - 34.0 pg   MCHC 33.2 30.0 - 36.0 g/dL   RDW 15.3 11.5 - 15.5 %   Platelets 323 150 - 400 K/uL  Glucose, capillary     Status: Abnormal   Collection Time: 08/23/16  7:24 AM  Result Value Ref Range   Glucose-Capillary 119 (H) 65 - 99 mg/dL   Comment 1 Notify RN    Comment 2 Document in Chart   Iron and TIBC     Status: Abnormal   Collection Time: 08/23/16  9:07 AM  Result Value Ref Range   Iron 33 28 - 170 ug/dL   TIBC 349 250 - 450 ug/dL   Saturation Ratios 9 (L) 10.4 - 31.8 %   UIBC 316 ug/dL    Comment: Performed at [facility], [address], [place], [place] [address]  Glucose, capillary     Status: Abnormal   Collection Time: 08/23/16 11:40 AM  Result Value Ref Range   Glucose-Capillary 192 (H) 65 - 99 mg/dL   Comment 1 Notify RN    Comment 2 Document in Chart   Glucose, capillary     Status: Abnormal    Collection Time: 08/23/16  4:06 PM  Result Value Ref Range   Glucose-Capillary 212 (H) 65 - 99 mg/dL  Glucose, capillary     Status: Abnormal   Collection Time: 08/23/16  9:19 PM  Result Value Ref Range   Glucose-Capillary 305 (H) 65 - 99 mg/dL  Basic metabolic panel     Status: Abnormal   Collection Time: 08/24/16 12:04 AM  Result Value Ref Range   Sodium 137 135 - 145 mmol/L   Potassium 3.8 3.5 - 5.1 mmol/L   Chloride 99 (L) 101 - 111 mmol/L   CO2 27 22 - 32 mmol/L   Glucose, Bld 322 (H) 65 - 99 mg/dL   BUN 92 (H) 6 - 20 mg/dL   Creatinine, Ser 2.08 (H) 0.44 - 1.00 mg/dL   Calcium 7.7 (L) 8.9 - 10.3 mg/dL   GFR calc non Af Amer 21 (L) >60 mL/min   GFR calc Af Amer 24 (L) >60 mL/min    Comment: (NOTE) The eGFR has been calculated using the CKD EPI equation. This calculation has not been validated in all clinical situations. eGFR's persistently <60 mL/min signify possible Chronic Kidney Disease.    Anion gap 11 5 - 15  Basic metabolic panel     Status: Abnormal   Collection Time: 08/24/16  5:51 AM  Result Value Ref Range   Sodium 139 135 - 145 mmol/L   Potassium 3.7 3.5 - 5.1 mmol/L   Chloride 100 (L) 101 - 111 mmol/L   CO2 28 22 - 32 mmol/L   Glucose, Bld 201 (H) 65 - 99 mg/dL   BUN 88 (H) 6 - 20 mg/dL   Creatinine, Ser 1.89 (H) 0.44 - 1.00 mg/dL   Calcium 7.8 (L) 8.9 - 10.3 mg/dL   GFR calc non Af Amer 23 (L) >60 mL/min   GFR calc Af Amer 27 (L) >60 mL/min    Comment: (NOTE) The eGFR has been calculated using the CKD EPI equation. This calculation has not been validated in all clinical situations. eGFR's persistently <60 mL/min signify possible Chronic Kidney Disease.    Anion gap 11 5 - 15  Protime-INR     Status: Abnormal  Collection Time: 08/24/16  5:51 AM  Result Value Ref Range   Prothrombin Time 17.8 (H) 11.4 - 15.2 seconds   INR 1.45   Glucose, capillary     Status: Abnormal   Collection Time: 08/24/16  7:50 AM  Result Value Ref Range    Glucose-Capillary 165 (H) 65 - 99 mg/dL    ABGS No results for input(s): PHART, PO2ART, TCO2, HCO3 in the last 72 hours.  Invalid input(s): PCO2 CULTURES Recent Results (from the past 240 hour(s))  MRSA PCR Screening     Status: None   Collection Time: 08/19/16  3:44 PM  Result Value Ref Range Status   MRSA by PCR NEGATIVE NEGATIVE Final    Comment:        The GeneXpert MRSA Assay (FDA approved for NASAL specimens only), is one component of a comprehensive MRSA colonization surveillance program. It is not intended to diagnose MRSA infection nor to guide or monitor treatment for MRSA infections.    Studies/Results: Dg Chest Port 1 View  Result Date: 08/22/2016 CLINICAL DATA:  PICC line placement. EXAM: PORTABLE CHEST 1 VIEW COMPARISON:  08/18/2016 and 07/20/2016 radiographs.  CT 11/27/2015. FINDINGS: 1403 hour. New right arm PICC extends to the superior cavoatrial junction. There is stable cardiomegaly and aortic atherosclerosis. The lungs are clear. There is no pleural effusion or pneumothorax. IMPRESSION: No pneumothorax post PICC line placement. Tip of the line extends to the level of the superior cavoatrial junction. Stable cardiomegaly. Electronically Signed   By: Richardean Sale M.D.   On: 08/22/2016 14:19    Medications:  Prior to Admission:  Prescriptions Prior to Admission  Medication Sig Dispense Refill Last Dose  . acetaminophen (TYLENOL) 325 MG tablet Take 2 tablets (650 mg total) by mouth every 4 (four) hours as needed for mild pain, moderate pain, fever or headache.   unknown  . ALPRAZolam (XANAX) 0.25 MG tablet Take 0.25 mg by mouth 3 (three) times daily as needed.   unknown  . amLODipine (NORVASC) 2.5 MG tablet TAKE (1) TABLET BY MOUTH DAILY. 30 tablet 6 08/18/2016 at Unknown time  . atorvastatin (LIPITOR) 40 MG tablet Take 1 tablet (40 mg total) by mouth daily. 90 tablet 3 08/18/2016 at Unknown time  . clopidogrel (PLAVIX) 75 MG tablet Take 1 tablet (75 mg total)  by mouth daily. 90 tablet 3 08/18/2016 at Unknown time  . glimepiride (AMARYL) 2 MG tablet Take 2 mg by mouth daily.     08/18/2016 at Unknown time  . hydrALAZINE (APRESOLINE) 100 MG tablet Take 1 tablet (100 mg total) by mouth 3 (three) times daily. 270 tablet 3 08/18/2016 at Unknown time  . HYDROcodone-acetaminophen (NORCO/VICODIN) 5-325 MG tablet Take 1 tablet by mouth 4 (four) times daily.    08/18/2016 at Unknown time  . insulin detemir (LEVEMIR) 100 UNIT/ML injection Inject 0.2 mLs (20 Units total) into the skin at bedtime. 10 mL 11 08/17/2016 at Unknown time  . insulin lispro (HUMALOG) 100 UNIT/ML injection Inject 0-0.15 mLs (0-15 Units total) into the skin 3 (three) times daily with meals. Use as sliding scale 10 mL 11 08/18/2016 at Unknown time  . isosorbide mononitrate (IMDUR) 60 MG 24 hr tablet Take 1 tablet (60 mg total) by mouth daily. 90 tablet 3 08/18/2016 at Unknown time  . nitroGLYCERIN (NITROSTAT) 0.4 MG SL tablet Place 1 tablet (0.4 mg total) under the tongue every 5 (five) minutes as needed for chest pain. 25 tablet 2 unknown  . pantoprazole (PROTONIX) 40 MG tablet  Take 1 tablet (40 mg total) by mouth daily at 6 (six) AM. 90 tablet 3 08/18/2016 at Unknown time  . PROAIR HFA 108 (90 Base) MCG/ACT inhaler Inhale 1-2 puffs into the lungs 4 (four) times daily.    08/18/2016 at Unknown time  . torsemide (DEMADEX) 20 MG tablet Take 2 tablets (40 mg total) by mouth 2 (two) times daily. 120 tablet 6 08/18/2016 at Unknown time  . TRAVATAN Z 0.004 % SOLN ophthalmic solution Place 1 drop into both eyes at bedtime.    08/17/2016 at Unknown time  . umeclidinium bromide (INCRUSE ELLIPTA) 62.5 MCG/INH AEPB Inhale 1 puff into the lungs daily.   08/18/2016 at Unknown time  . warfarin (COUMADIN) 5 MG tablet TAKE 1 TABLET BY MOUTH DAILY EXCEPT 1/2 TABLET ON WEDNESDAYS AND SATURDAYS. 30 tablet 6 08/17/2016 at 2130   Scheduled: . amLODipine  2.5 mg Oral Daily  . atorvastatin  40 mg Oral q1800  . clopidogrel  75  mg Oral Daily  . furosemide  80 mg Intravenous BID  . glimepiride  2 mg Oral Daily  . hydrALAZINE  100 mg Oral TID  . insulin aspart  0-15 Units Subcutaneous TID WC  . insulin aspart  0-5 Units Subcutaneous QHS  . insulin aspart  4 Units Subcutaneous TID WC  . insulin detemir  15 Units Subcutaneous QHS  . ipratropium-albuterol  3 mL Nebulization TID  . isosorbide mononitrate  60 mg Oral Daily  . latanoprost  1 drop Both Eyes QHS  . nitroGLYCERIN  0.5 inch Topical Q6H  . pantoprazole  40 mg Oral Q0600  . sodium chloride flush  3 mL Intravenous Q12H  . Warfarin - Pharmacist Dosing Inpatient   Does not apply Q24H   Continuous: . sodium chloride     JFH:LKTGYB chloride, acetaminophen, albuterol, ALPRAZolam, metoprolol tartrate, morphine injection, ondansetron (ZOFRAN) IV, sodium chloride flush  Assesment: She has acute on chronic diastolic heart failure. She is diuresing. She has chronic atrial fib on chronic anticoagulation and Coumadin has been restarted. She does have evidence of iron deficiency anemia but no evidence of active bleeding at this time. She will need GI workup but this can probably be done as an outpatient. Her blood sugar has been up but she is off steroids now so that should help so not going to adjust her medications yet. Principal Problem:   Acute on chronic diastolic CHF (congestive heart failure) (HCC) Active Problems:   Essential hypertension, benign   Persistent atrial fibrillation (HCC)   Long term current use of anticoagulant   Chronic kidney disease (CKD), stage IV (severe) (HCC)   Status post coronary artery stent placement   Diabetes mellitus type 2, controlled ([place])   Acute on chronic diastolic (congestive) heart failure ([place])    Plan: Continue treatments. Transfer out of stepdown.    LOS: 5 days   Makeila Yamaguchi L 08/24/2016, 8:14 AM

## 2016-08-24 NOTE — Patient Outreach (Signed)
[facility] Care Management  08/24/2016  Melissa [name] 1930/04/30 [id number]   [name] is an 83 year old lady who lives in [place] and is currently hospitalized at [facility] for treatment of an acute exacerbation of chronic diastolic heart failure. She has chronic atrial fibrillation and is on coumadin, has a history of coronary artery disease and underwent intervention in October of 2017. Melissa [name] has stage III chronic kidney disease.   [name] for transition of care assessments and assistance with care coordination and chronic disease management upon her discharge to home.   Plan: I will follow Melissa [name]'s progress at the hospital and reach out to her and her daughter upon discharge.    Marja Kayslisa Gilboy MHA,BSN,RN,CCM [facility]  [phone]

## 2016-08-24 NOTE — Progress Notes (Signed)
Progress Note  Patient Name: Melissa [name] Date of Encounter: 08/24/2016  Primary Cardiologist: Dr. Diona Browner  Subjective   Breathing a little better  Inpatient Medications    Scheduled Meds: . amLODipine  2.5 mg Oral Daily  . atorvastatin  40 mg Oral q1800  . clopidogrel  75 mg Oral Daily  . furosemide  80 mg Intravenous BID  . glimepiride  2 mg Oral Daily  . hydrALAZINE  100 mg Oral TID  . insulin aspart  0-15 Units Subcutaneous TID WC  . insulin aspart  0-5 Units Subcutaneous QHS  . insulin aspart  4 Units Subcutaneous TID WC  . insulin detemir  15 Units Subcutaneous QHS  . ipratropium-albuterol  3 mL Nebulization TID  . isosorbide mononitrate  60 mg Oral Daily  . latanoprost  1 drop Both Eyes QHS  . nitroGLYCERIN  0.5 inch Topical Q6H  . pantoprazole  40 mg Oral Q0600  . sodium chloride flush  3 mL Intravenous Q12H  . Warfarin - Pharmacist Dosing Inpatient   Does not apply Q24H   Continuous Infusions: . sodium chloride     PRN Meds: sodium chloride, acetaminophen, albuterol, ALPRAZolam, metoprolol tartrate, morphine injection, ondansetron (ZOFRAN) IV, sodium chloride flush   Vital Signs    Vitals:   08/23/16 2300 08/24/16 0000 08/24/16 0100 08/24/16 0500  BP: 130/66 (!) 145/61 140/64   Pulse: 79 80 69   Resp: 15 13 11    Temp:   98.4 F (36.9 C)   TempSrc:   Oral   SpO2: 93% 94% 92%   Weight:    220 lb 0.3 oz (99.8 kg)  Height:        Intake/Output Summary (Last 24 hours) at 08/24/16 0753 Last data filed at 08/24/16 0100  Gross per 24 hour  Intake              480 ml  Output             2950 ml  Net            -2470 ml   Filed Weights   08/22/16 0500 08/23/16 0500 08/24/16 0500  Weight: 224 lb 3.3 oz (101.7 kg) 220 lb 3.8 oz (99.9 kg) 220 lb 0.3 oz (99.8 kg)    Telemetry    Afib 47-70's- Personally Reviewed  ECG       Physical Exam    GEN: No acute distress.   Neck: Increased JVD Cardiac: RRR,2-3/6 sys murmur LSB Respiratory:  rales bilaterally bases. GI: Soft, nontender, non-distended  MS: plus 1-2 edema; No deformity. Neuro:  Nonfocal  Psych: Normal affect   Labs    Chemistry Recent Labs Lab 08/18/16 2210  08/23/16 0511 08/24/16 0004 08/24/16 0551  NA 133*  < > 138 137 139  K 5.1  < > 3.6 3.8 3.7  CL 98*  < > 99* 99* 100*  CO2 24  < > 26 27 28   GLUCOSE 106*  < > 148* 322* 201*  BUN 70*  < > 96* 92* 88*  CREATININE 2.27*  < > 2.08* 2.08* 1.89*  CALCIUM 8.6*  < > 7.9* 7.7* 7.8*  PROT 6.5  --   --   --   --   ALBUMIN 3.2*  --   --   --   --   AST 59*  --   --   --   --   ALT 35  --   --   --   --  ALKPHOS 144*  --   --   --   --   BILITOT 1.0  --   --   --   --   GFRNONAA 19*  < > 21* 21* 23*  GFRAA 22*  < > 24* 24* 27*  ANIONGAP 11  < > 13 11 11   < > = values in this interval not displayed.   Hematology Recent Labs Lab 08/18/16 2210 08/20/16 0425 08/23/16 0511  WBC 7.0 7.3 7.8  RBC 2.60* 2.61* 2.77*  HGB 8.3* 8.2* 8.8*  HCT 25.1* 25.1* 26.5*  MCV 96.5 96.2 95.7  MCH 31.9 31.4 31.8  MCHC 33.1 32.7 33.2  RDW 15.9* 15.8* 15.3  PLT 252 276 323    Cardiac Enzymes Recent Labs Lab 08/19/16 1002 08/19/16 1625 08/19/16 2246  TROPONINI 0.06* 0.10* 0.13*    Recent Labs Lab 08/18/16 2227  TROPIPOC 0.03     BNP Recent Labs Lab 08/18/16 2210  BNP 743.0*     DDimer No results for input(s): DDIMER in the last 168 hours.   Radiology    Dg Chest Port 1 View  Result Date: 08/22/2016 CLINICAL DATA:  PICC line placement. EXAM: PORTABLE CHEST 1 VIEW COMPARISON:  08/18/2016 and 07/20/2016 radiographs.  CT 11/27/2015. FINDINGS: 1403 hour. New right arm PICC extends to the superior cavoatrial junction. There is stable cardiomegaly and aortic atherosclerosis. The lungs are clear. There is no pleural effusion or pneumothorax. IMPRESSION: No pneumothorax post PICC line placement. Tip of the line extends to the level of the superior cavoatrial junction. Stable cardiomegaly. Electronically  Signed   By: Carey Bullocks M.D.   On: 08/22/2016 14:19    Cardiac Studies   Echocardiogram 08/19/2016 Left ventricle: The cavity size was normal. Wall thickness was   increased in a pattern of moderate LVH. Systolic function was   normal. The estimated ejection fraction was in the range of 60%   to 65%. - Aortic valve: Moderately calcified annulus. Trileaflet;   moderately thickened leaflets. Valve area (VTI): 2.11 cm^2. Valve   area (Vmax): 1.68 cm^2. Valve area (Vmean): 1.61 cm^2. - Mitral valve: There was mild regurgitation. - Left atrium: The atrium was massively dilated. - Right ventricle: The ventricular septum is flattened in systole   and diastole suggesting RV pressure and volume overload. The   cavity size was moderately dilated. - Right atrium: The atrium was massively dilated. - Atrial septum: No defect or patent foramen ovale was identified. - Tricuspid valve: There was moderate-severe regurgitation. There   is hepatic systolic flow reversal sugesting severe TR. - Pulmonary arteries: Systolic pressure was severely increased. PA   peak pressure: 77 mm Hg (S). - Technically adequate study.   Patient Profile     83 y.o. female  hx of Chronic diastolic heart failure, chronic atrial fibrillation on coumadin, coronary artery disease with high risk DES intervention to the circumflex in 11/2015, chronic kidney disease stage III who is being seen today for the evaluation of chest pain and dyspnea at the request of Dr. Juanetta Gosling. Diagnosed with acute on chronic diastolic CHF.    Assessment & Plan    1. Atrial fib: Heart rate is controlled.No AV nodal blocking agents. On coumadin per pharmacy. Hgb 8.8 yest.   2. Acute on Chronic Diastolic CHF: Has diuresed >11 liters and is now on IV lasix 80 mg IV BID.  I/O's -2470 past 24 hrs. Still has a lot of fluid on board. Continue diuresis. Steroids stopped. Watching renal.  3. CAD: DES to to circumflex: Continues on Plavix. No ASA as  she is on Coumadin. No active bleeding or angina.    4. Hypertension: Stable on amlodipine, hydralazine, and isosorbide mononitrate.   5. CKD: Stage III: Creatinine improved to 1.89 today.    5. Anemia:  Watching on coumadin and plavix     Signed, Jacolyn ReedyMichele Lenze, PA-C  08/24/2016, 7:53 AM    The patient was seen and examined, and I agree with the history, physical exam, assessment and plan as documented above, with modifications as noted below.  She is feeling well and without complaints. Diuresing well with over 2.4 L out in last 24 hrs on IV Lasix 80 mg BID. BUN/creatinine elevated but decreased from yesterday. BP is better controlled today. No changes to medications today.   Prentice DockerSuresh Koneswaran, MD, [facility]  08/24/2016 10:58 AM

## 2016-08-24 NOTE — Progress Notes (Signed)
Patient resting in bed watching tv. Vital signs are stable. IV patent. MOM 30 mL given for c/o of constipation. Receiving nurse Ree EdmanMaryBeth Hawkins, made aware. Patient to be transferred via bed to room 305.

## 2016-08-25 ENCOUNTER — Ambulatory Visit: Payer: Medicare Other | Admitting: Adult Health

## 2016-08-25 LAB — GLUCOSE, CAPILLARY
GLUCOSE-CAPILLARY: 103 mg/dL — AB (ref 65–99)
GLUCOSE-CAPILLARY: 125 mg/dL — AB (ref 65–99)
GLUCOSE-CAPILLARY: 189 mg/dL — AB (ref 65–99)
Glucose-Capillary: 135 mg/dL — ABNORMAL HIGH (ref 65–99)

## 2016-08-25 LAB — CBC WITH DIFFERENTIAL/PLATELET
BASOS PCT: 0 %
Basophils Absolute: 0 10*3/uL (ref 0.0–0.1)
EOS ABS: 0.2 10*3/uL (ref 0.0–0.7)
EOS PCT: 2 %
HCT: 27.8 % — ABNORMAL LOW (ref 36.0–46.0)
Hemoglobin: 9.1 g/dL — ABNORMAL LOW (ref 12.0–15.0)
LYMPHS PCT: 9 %
Lymphs Abs: 0.9 10*3/uL (ref 0.7–4.0)
MCH: 31.4 pg (ref 26.0–34.0)
MCHC: 32.7 g/dL (ref 30.0–36.0)
MCV: 95.9 fL (ref 78.0–100.0)
MONO ABS: 1 10*3/uL (ref 0.1–1.0)
Monocytes Relative: 10 %
Neutro Abs: 7.9 10*3/uL — ABNORMAL HIGH (ref 1.7–7.7)
Neutrophils Relative %: 79 %
Platelets: 307 10*3/uL (ref 150–400)
RBC: 2.9 MIL/uL — AB (ref 3.87–5.11)
RDW: 15.2 % (ref 11.5–15.5)
WBC: 10 10*3/uL (ref 4.0–10.5)

## 2016-08-25 LAB — PROTIME-INR
INR: 1.48
Prothrombin Time: 18.1 seconds — ABNORMAL HIGH (ref 11.4–15.2)

## 2016-08-25 MED ORDER — BISACODYL 10 MG RE SUPP
10.0000 mg | Freq: Every day | RECTAL | Status: DC | PRN
  Administered 2016-08-29: 10 mg via RECTAL
  Filled 2016-08-25: qty 1

## 2016-08-25 MED ORDER — ISOSORBIDE MONONITRATE ER 30 MG PO TB24: 15.0000 mg | ORAL_TABLET | Freq: Every day | ORAL | Status: DC

## 2016-08-25 MED ORDER — ISOSORBIDE MONONITRATE ER 30 MG PO TB24
90.0000 mg | ORAL_TABLET | Freq: Every day | ORAL | Status: DC
  Administered 2016-08-25 – 2016-09-02 (×9): 90 mg via ORAL
  Filled 2016-08-25 (×9): qty 3

## 2016-08-25 MED ORDER — BISACODYL 5 MG PO TBEC: 5.0000 mg | DELAYED_RELEASE_TABLET | Freq: Every day | ORAL | Status: DC | PRN

## 2016-08-25 MED ORDER — WARFARIN SODIUM 5 MG PO TABS
5.0000 mg | ORAL_TABLET | Freq: Once | ORAL | Status: AC
  Administered 2016-08-25: 5 mg via ORAL
  Filled 2016-08-25: qty 1

## 2016-08-25 NOTE — Progress Notes (Signed)
Subjective: She says she feels okay except she's having a lot of constipation. No other new complaints. I'm concerned that she may not be able to go directly home because of weakness. She still has fluid in her legs.  Objective: Vital signs in last 24 hours: Temp:  [98.1 F (36.7 C)-98.8 F (37.1 C)] 98.8 F (37.1 C) (07/19 0512) Pulse Rate:  [61-77] 61 (07/19 0512) Resp:  [15-18] 18 (07/19 0512) BP: (130-140)/(56-63) 130/62 (07/19 0512) SpO2:  [97 %-100 %] 98 % (07/19 0807) Weight:  [97.3 kg (214 lb 9.6 oz)] 97.3 kg (214 lb 9.6 oz) (07/19 0500) Weight change: -2.458 kg (-5 lb 6.7 oz) Last BM Date: 08/20/16  Intake/Output from previous day: 07/18 0701 - 07/19 0700 In: 4320 [P.O.:720] Out: -   PHYSICAL EXAM General appearance: alert, cooperative and mild distress Resp: clear to auscultation bilaterally Cardio: irregularly irregular rhythm GI: soft, non-tender; bowel sounds normal; no masses,  no organomegaly Extremities: Edema of her legs is better she still has some dependent edema of her arms Very hard of hearing and difficult to communicate with  Lab Results:  Results for orders placed or performed during the hospital encounter of 08/18/16 (from the past 48 hour(s))  Iron and TIBC     Status: Abnormal   Collection Time: 08/23/16  9:07 AM  Result Value Ref Range   Iron 33 28 - 170 ug/dL   TIBC 349 250 - 450 ug/dL   Saturation Ratios 9 (L) 10.4 - 31.8 %   UIBC 316 ug/dL    Comment: Performed at [facility], [address] [place], [place] [address]  Glucose, capillary     Status: Abnormal   Collection Time: 08/23/16 11:40 AM  Result Value Ref Range   Glucose-Capillary 192 (H) 65 - 99 mg/dL   Comment 1 Notify RN    Comment 2 Document in Chart   Glucose, capillary     Status: Abnormal   Collection Time: 08/23/16  4:06 PM  Result Value Ref Range   Glucose-Capillary 212 (H) 65 - 99 mg/dL  Glucose, capillary     Status: Abnormal   Collection Time: 08/23/16  9:19  PM  Result Value Ref Range   Glucose-Capillary 305 (H) 65 - 99 mg/dL  Basic metabolic panel     Status: Abnormal   Collection Time: 08/24/16 12:04 AM  Result Value Ref Range   Sodium 137 135 - 145 mmol/L   Potassium 3.8 3.5 - 5.1 mmol/L   Chloride 99 (L) 101 - 111 mmol/L   CO2 27 22 - 32 mmol/L   Glucose, Bld 322 (H) 65 - 99 mg/dL   BUN 92 (H) 6 - 20 mg/dL   Creatinine, Ser 2.08 (H) 0.44 - 1.00 mg/dL   Calcium 7.7 (L) 8.9 - 10.3 mg/dL   GFR calc non Af Amer 21 (L) >60 mL/min   GFR calc Af Amer 24 (L) >60 mL/min    Comment: (NOTE) The eGFR has been calculated using the CKD EPI equation. This calculation has not been validated in all clinical situations. eGFR's persistently <60 mL/min signify possible Chronic Kidney Disease.    Anion gap 11 5 - 15  Basic metabolic panel     Status: Abnormal   Collection Time: 08/24/16  5:51 AM  Result Value Ref Range   Sodium 139 135 - 145 mmol/L   Potassium 3.7 3.5 - 5.1 mmol/L   Chloride 100 (L) 101 - 111 mmol/L   CO2 28 22 - 32  mmol/L   Glucose, Bld 201 (H) 65 - 99 mg/dL   BUN 88 (H) 6 - 20 mg/dL   Creatinine, Ser 1.89 (H) 0.44 - 1.00 mg/dL   Calcium 7.8 (L) 8.9 - 10.3 mg/dL   GFR calc non Af Amer 23 (L) >60 mL/min   GFR calc Af Amer 27 (L) >60 mL/min    Comment: (NOTE) The eGFR has been calculated using the CKD EPI equation. This calculation has not been validated in all clinical situations. eGFR's persistently <60 mL/min signify possible Chronic Kidney Disease.    Anion gap 11 5 - 15  Protime-INR     Status: Abnormal   Collection Time: 08/24/16  5:51 AM  Result Value Ref Range   Prothrombin Time 17.8 (H) 11.4 - 15.2 seconds   INR 1.45   Glucose, capillary     Status: Abnormal   Collection Time: 08/24/16  7:50 AM  Result Value Ref Range   Glucose-Capillary 165 (H) 65 - 99 mg/dL  Glucose, capillary     Status: Abnormal   Collection Time: 08/24/16 11:12 AM  Result Value Ref Range   Glucose-Capillary 149 (H) 65 - 99 mg/dL   Glucose, capillary     Status: Abnormal   Collection Time: 08/24/16  5:23 PM  Result Value Ref Range   Glucose-Capillary 142 (H) 65 - 99 mg/dL   Comment 1 Notify RN   Glucose, capillary     Status: Abnormal   Collection Time: 08/24/16  8:37 PM  Result Value Ref Range   Glucose-Capillary 181 (H) 65 - 99 mg/dL   Comment 1 Notify RN    Comment 2 Document in Chart   Glucose, capillary     Status: Abnormal   Collection Time: 08/25/16  7:51 AM  Result Value Ref Range   Glucose-Capillary 103 (H) 65 - 99 mg/dL    ABGS No results for input(s): PHART, PO2ART, TCO2, HCO3 in the last 72 hours.  Invalid input(s): PCO2 CULTURES Recent Results (from the past 240 hour(s))  MRSA PCR Screening     Status: None   Collection Time: 08/19/16  3:44 PM  Result Value Ref Range Status   MRSA by PCR NEGATIVE NEGATIVE Final    Comment:        The GeneXpert MRSA Assay (FDA approved for NASAL specimens only), is one component of a comprehensive MRSA colonization surveillance program. It is not intended to diagnose MRSA infection nor to guide or monitor treatment for MRSA infections.    Studies/Results: No results found.  Medications:  Prior to Admission:  Prescriptions Prior to Admission  Medication Sig Dispense Refill Last Dose  . acetaminophen (TYLENOL) 325 MG tablet Take 2 tablets (650 mg total) by mouth every 4 (four) hours as needed for mild pain, moderate pain, fever or headache.   unknown  . ALPRAZolam (XANAX) 0.25 MG tablet Take 0.25 mg by mouth 3 (three) times daily as needed.   unknown  . amLODipine (NORVASC) 2.5 MG tablet TAKE (1) TABLET BY MOUTH DAILY. 30 tablet 6 08/18/2016 at Unknown time  . atorvastatin (LIPITOR) 40 MG tablet Take 1 tablet (40 mg total) by mouth daily. 90 tablet 3 08/18/2016 at Unknown time  . clopidogrel (PLAVIX) 75 MG tablet Take 1 tablet (75 mg total) by mouth daily. 90 tablet 3 08/18/2016 at Unknown time  . glimepiride (AMARYL) 2 MG tablet Take 2 mg by mouth  daily.     08/18/2016 at Unknown time  . hydrALAZINE (APRESOLINE) 100 MG tablet Take 1  tablet (100 mg total) by mouth 3 (three) times daily. 270 tablet 3 08/18/2016 at Unknown time  . HYDROcodone-acetaminophen (NORCO/VICODIN) 5-325 MG tablet Take 1 tablet by mouth 4 (four) times daily.    08/18/2016 at Unknown time  . insulin detemir (LEVEMIR) 100 UNIT/ML injection Inject 0.2 mLs (20 Units total) into the skin at bedtime. 10 mL 11 08/17/2016 at Unknown time  . insulin lispro (HUMALOG) 100 UNIT/ML injection Inject 0-0.15 mLs (0-15 Units total) into the skin 3 (three) times daily with meals. Use as sliding scale 10 mL 11 08/18/2016 at Unknown time  . isosorbide mononitrate (IMDUR) 60 MG 24 hr tablet Take 1 tablet (60 mg total) by mouth daily. 90 tablet 3 08/18/2016 at Unknown time  . nitroGLYCERIN (NITROSTAT) 0.4 MG SL tablet Place 1 tablet (0.4 mg total) under the tongue every 5 (five) minutes as needed for chest pain. 25 tablet 2 unknown  . pantoprazole (PROTONIX) 40 MG tablet Take 1 tablet (40 mg total) by mouth daily at 6 (six) AM. 90 tablet 3 08/18/2016 at Unknown time  . PROAIR HFA 108 (90 Base) MCG/ACT inhaler Inhale 1-2 puffs into the lungs 4 (four) times daily.    08/18/2016 at Unknown time  . torsemide (DEMADEX) 20 MG tablet Take 2 tablets (40 mg total) by mouth 2 (two) times daily. 120 tablet 6 08/18/2016 at Unknown time  . TRAVATAN Z 0.004 % SOLN ophthalmic solution Place 1 drop into both eyes at bedtime.    08/17/2016 at Unknown time  . umeclidinium bromide (INCRUSE ELLIPTA) 62.5 MCG/INH AEPB Inhale 1 puff into the lungs daily.   08/18/2016 at Unknown time  . warfarin (COUMADIN) 5 MG tablet TAKE 1 TABLET BY MOUTH DAILY EXCEPT 1/2 TABLET ON WEDNESDAYS AND SATURDAYS. 30 tablet 6 08/17/2016 at 2130   Scheduled: . amLODipine  2.5 mg Oral Daily  . atorvastatin  40 mg Oral q1800  . clopidogrel  75 mg Oral Daily  . furosemide  80 mg Intravenous BID  . glimepiride  2 mg Oral Daily  . hydrALAZINE  100 mg  Oral TID  . insulin aspart  0-15 Units Subcutaneous TID WC  . insulin aspart  0-5 Units Subcutaneous QHS  . insulin aspart  4 Units Subcutaneous TID WC  . insulin detemir  15 Units Subcutaneous QHS  . ipratropium-albuterol  3 mL Nebulization TID  . isosorbide mononitrate  60 mg Oral Daily  . latanoprost  1 drop Both Eyes QHS  . nitroGLYCERIN  0.5 inch Topical Q6H  . pantoprazole  40 mg Oral Q0600  . sodium chloride flush  3 mL Intravenous Q12H  . Warfarin - Pharmacist Dosing Inpatient   Does not apply Q24H   Continuous: . sodium chloride     VQQ:VZDGLO chloride, acetaminophen, albuterol, ALPRAZolam, bisacodyl, bisacodyl, magnesium hydroxide, metoprolol tartrate, morphine injection, ondansetron (ZOFRAN) IV, sodium chloride flush  Assesment: She has acute on chronic diastolic heart failure. She also had acute on chronic renal failure which is better. She is improving slowly. She has long-term atrial fib. She has anemia which is multifactorial. Principal Problem:   Acute on chronic diastolic CHF (congestive heart failure) (HCC) Active Problems:   Essential hypertension, benign   Persistent atrial fibrillation (HCC)   Long term current use of anticoagulant   Chronic kidney disease (CKD), stage IV (severe) (HCC)   Status post coronary artery stent placement   Diabetes mellitus type 2, controlled ([place])   Acute on chronic diastolic (congestive) heart failure ([place])    Plan:  Continue treatments. Request physical therapy consultation. She may require rehabilitation stay    LOS: 6 days   Rawn Quiroa L 08/25/2016, 8:48 AM

## 2016-08-25 NOTE — Progress Notes (Signed)
Progress Note  Patient Name: Melissa [name] Date of Encounter: 08/25/2016  Primary Cardiologist: Diona Browner  Subjective   Complaints of right foot and leg pain.   Inpatient Medications    Scheduled Meds: . amLODipine  2.5 mg Oral Daily  . atorvastatin  40 mg Oral q1800  . clopidogrel  75 mg Oral Daily  . furosemide  80 mg Intravenous BID  . glimepiride  2 mg Oral Daily  . hydrALAZINE  100 mg Oral TID  . insulin aspart  0-15 Units Subcutaneous TID WC  . insulin aspart  0-5 Units Subcutaneous QHS  . insulin aspart  4 Units Subcutaneous TID WC  . insulin detemir  15 Units Subcutaneous QHS  . ipratropium-albuterol  3 mL Nebulization TID  . isosorbide mononitrate  60 mg Oral Daily  . latanoprost  1 drop Both Eyes QHS  . nitroGLYCERIN  0.5 inch Topical Q6H  . pantoprazole  40 mg Oral Q0600  . sodium chloride flush  3 mL Intravenous Q12H  . Warfarin - Pharmacist Dosing Inpatient   Does not apply Q24H   Continuous Infusions: . sodium chloride     PRN Meds: sodium chloride, acetaminophen, albuterol, ALPRAZolam, bisacodyl, bisacodyl, magnesium hydroxide, metoprolol tartrate, morphine injection, ondansetron (ZOFRAN) IV, sodium chloride flush   Vital Signs    Vitals:   08/24/16 2101 08/25/16 0500 08/25/16 0512 08/25/16 0807  BP: 137/61  130/62   Pulse: 77  61   Resp: 18  18   Temp: 98.5 F (36.9 C)  98.8 F (37.1 C)   TempSrc: Oral  Oral   SpO2: 100%  100% 98%  Weight:  214 lb 9.6 oz (97.3 kg)    Height:        Intake/Output Summary (Last 24 hours) at 08/25/16 0838 Last data filed at 08/25/16 0513  Gross per 24 hour  Intake             4320 ml  Output                0 ml  Net             4320 ml   Filed Weights   08/23/16 0500 08/24/16 0500 08/25/16 0500  Weight: 220 lb 3.8 oz (99.9 kg) 220 lb 0.3 oz (99.8 kg) 214 lb 9.6 oz (97.3 kg)    Telemetry    Atrial fib rates in the 70's,  Personally Reviewed  Physical Exam   GEN: No acute distress.   Neck: No  JVD Cardiac: IRRR, no murmurs, rubs, or gallops.  Respiratory: Clear to auscultation bilaterally. GI: Soft, nontender, non-distended Ventral hernia.  MS: 2+ pitting pretibial edema; No deformity. Neuro:  Nonfocal  Psych: Normal affect   Labs    Chemistry Recent Labs Lab 08/18/16 2210  08/23/16 0511 08/24/16 0004 08/24/16 0551  NA 133*  < > 138 137 139  K 5.1  < > 3.6 3.8 3.7  CL 98*  < > 99* 99* 100*  CO2 24  < > 26 27 28   GLUCOSE 106*  < > 148* 322* 201*  BUN 70*  < > 96* 92* 88*  CREATININE 2.27*  < > 2.08* 2.08* 1.89*  CALCIUM 8.6*  < > 7.9* 7.7* 7.8*  PROT 6.5  --   --   --   --   ALBUMIN 3.2*  --   --   --   --   AST 59*  --   --   --   --  ALT 35  --   --   --   --   ALKPHOS 144*  --   --   --   --   BILITOT 1.0  --   --   --   --   GFRNONAA 19*  < > 21* 21* 23*  GFRAA 22*  < > 24* 24* 27*  ANIONGAP 11  < > 13 11 11   < > = values in this interval not displayed.   Hematology Recent Labs Lab 08/18/16 2210 08/20/16 0425 08/23/16 0511  WBC 7.0 7.3 7.8  RBC 2.60* 2.61* 2.77*  HGB 8.3* 8.2* 8.8*  HCT 25.1* 25.1* 26.5*  MCV 96.5 96.2 95.7  MCH 31.9 31.4 31.8  MCHC 33.1 32.7 33.2  RDW 15.9* 15.8* 15.3  PLT 252 276 323    Cardiac Enzymes Recent Labs Lab 08/19/16 1002 08/19/16 1625 08/19/16 2246  TROPONINI 0.06* 0.10* 0.13*    Recent Labs Lab 08/18/16 2227  TROPIPOC 0.03     BNP Recent Labs Lab 08/18/16 2210  BNP 743.0*     Radiology    No results found.  Cardiac Studies   Echocardiogram 08/19/2016 Left ventricle: The cavity size was normal. Wall thickness was increased in a pattern of moderate LVH. Systolic function was normal. The estimated ejection fraction was in the range of 60% to 65%. - Aortic valve: Moderately calcified annulus. Trileaflet; moderately thickened leaflets. Valve area (VTI): 2.11 cm^2. Valve area (Vmax): 1.68 cm^2. Valve area (Vmean): 1.61 cm^2. - Mitral valve: There was mild regurgitation. - Left  atrium: The atrium was massively dilated. - Right ventricle: The ventricular septum is flattened in systole and diastole suggesting RV pressure and volume overload. The cavity size was moderately dilated. - Right atrium: The atrium was massively dilated. - Atrial septum: No defect or patent foramen ovale was identified. - Tricuspid valve: There was moderate-severe regurgitation. There is hepatic systolic flow reversal sugesting severe TR. - Pulmonary arteries: Systolic pressure was severely increased. PA peak pressure: 77 mm Hg (S). - Technically adequate study.   Patient Profile     83 y.o. female hx of chronic diastolic heart failure, chronic atrial fibrillation on coumadin, coronary artery disease with high risk DES intervention to the circumflex in 11/2015, chronic kidney disease stage IIIwho is being seen today for the evaluation of chest pain and dyspnea at the request of Dr. Juanetta [name]. Diagnosed with acute on chronic diastolic CHF.   Assessment & Plan    1. Atrial fib: Heart rate is controlled. She is not on any AV nodal blocking agents. Coumadin per pharmacy. INR 1.45 currently.  2. Acute on Chronic Diastolic CHF: Continues to have bilateral LEE even when legs are elevated. She remains on IV lasix but no urine output has been documented since leaving ICU. Total output 7.3 liters. Will reinstate strict I/O. Weight is down from 220 lbs on admission to 214 lbs this am. Creatinine is improving to 1.89 yesterday,  from 2.29 on admission. Still has NTG paste topically with isosorbide 60 mg po daily. Will change to 90 mg dose daily and d/c paste.  Will add TED hose to help with venous return.   3. CAD: DES to Cx. Continues on Plavix, but no ASA as she is on coumadin. No further complaints of chest pain. Changing to po nitrates.   4. CKD Stage III: Follow BMET  5. Anemia: Follow labs. No active bleeding.     Signed, Melissa [name]. Melissa [name], ANP, AACC   08/25/2016, 8:38  AM     The patient was seen and examined, and I agree with the history, physical exam, assessment and plan as documented above, with modifications as noted below.  She is gradually improving clinically. Leg swelling is decreasing. I/O's inaccurate since leaving ICU. Denies chest pain and shortness of breath. Remains on IV Lasix. BUN/creatinine pending for this morning. I spoke with nurse who plans to get her in the chair which will help to mobilize fluids and assist with urinary output.   Prentice DockerSuresh Arrion Broaddus, MD, [facility] HospitalFACC  08/25/2016 10:33 AM

## 2016-08-25 NOTE — Progress Notes (Signed)
ANTICOAGULATION CONSULT NOTE  Pharmacy Consult for COUMADIN (home med) Indication: atrial fibrillation  No Known Allergies  Patient Measurements: Height: 5\' 4"  (162.6 cm) Weight: 214 lb 9.6 oz (97.3 kg) IBW/kg (Calculated) : 54.7  Vital Signs: Temp: 98.8 F (37.1 C) (07/19 0512) Temp Source: Oral (07/19 0512) BP: 130/62 (07/19 0512) Pulse Rate: 61 (07/19 0512)  Labs:  Recent Labs  08/23/16 0511 08/24/16 0004 08/24/16 0551 08/25/16 0930 08/25/16 1033  HGB 8.8*  --   --  9.1*  --   HCT 26.5*  --   --  27.8*  --   PLT 323  --   --  307  --   LABPROT 19.0*  --  17.8*  --  18.1*  INR 1.58  --  1.45  --  1.48  CREATININE 2.08* 2.08* 1.89*  --   --    Estimated Creatinine Clearance: 25.5 mL/min (A) (by C-G formula based on SCr of 1.89 mg/dL (H)).  Medical History: Past Medical History:  Diagnosis Date  . Atrial fibrillation (HCC)   . CAD (coronary artery disease)    Multivessel status post high risk PCI/DES to circumflex 11/2015 (poor candidate for CABG)  . CHF (congestive heart failure) (HCC)   . CKD (chronic kidney disease) stage 3, GFR 30-59 ml/min   . DDD (degenerative disc disease), lumbar   . Essential hypertension, benign   . Mixed hyperlipidemia   . Type 2 diabetes mellitus (HCC)    Medications:  Prescriptions Prior to Admission  Medication Sig Dispense Refill Last Dose  . acetaminophen (TYLENOL) 325 MG tablet Take 2 tablets (650 mg total) by mouth every 4 (four) hours as needed for mild pain, moderate pain, fever or headache.   unknown  . ALPRAZolam (XANAX) 0.25 MG tablet Take 0.25 mg by mouth 3 (three) times daily as needed.   unknown  . amLODipine (NORVASC) 2.5 MG tablet TAKE (1) TABLET BY MOUTH DAILY. 30 tablet 6 08/18/2016 at Unknown time  . atorvastatin (LIPITOR) 40 MG tablet Take 1 tablet (40 mg total) by mouth daily. 90 tablet 3 08/18/2016 at Unknown time  . clopidogrel (PLAVIX) 75 MG tablet Take 1 tablet (75 mg total) by mouth daily. 90 tablet 3  08/18/2016 at Unknown time  . glimepiride (AMARYL) 2 MG tablet Take 2 mg by mouth daily.     08/18/2016 at Unknown time  . hydrALAZINE (APRESOLINE) 100 MG tablet Take 1 tablet (100 mg total) by mouth 3 (three) times daily. 270 tablet 3 08/18/2016 at Unknown time  . HYDROcodone-acetaminophen (NORCO/VICODIN) 5-325 MG tablet Take 1 tablet by mouth 4 (four) times daily.    08/18/2016 at Unknown time  . insulin detemir (LEVEMIR) 100 UNIT/ML injection Inject 0.2 mLs (20 Units total) into the skin at bedtime. 10 mL 11 08/17/2016 at Unknown time  . insulin lispro (HUMALOG) 100 UNIT/ML injection Inject 0-0.15 mLs (0-15 Units total) into the skin 3 (three) times daily with meals. Use as sliding scale 10 mL 11 08/18/2016 at Unknown time  . isosorbide mononitrate (IMDUR) 60 MG 24 hr tablet Take 1 tablet (60 mg total) by mouth daily. 90 tablet 3 08/18/2016 at Unknown time  . nitroGLYCERIN (NITROSTAT) 0.4 MG SL tablet Place 1 tablet (0.4 mg total) under the tongue every 5 (five) minutes as needed for chest pain. 25 tablet 2 unknown  . pantoprazole (PROTONIX) 40 MG tablet Take 1 tablet (40 mg total) by mouth daily at 6 (six) AM. 90 tablet 3 08/18/2016 at Unknown time  .  PROAIR HFA 108 (90 Base) MCG/ACT inhaler Inhale 1-2 puffs into the lungs 4 (four) times daily.    08/18/2016 at Unknown time  . torsemide (DEMADEX) 20 MG tablet Take 2 tablets (40 mg total) by mouth 2 (two) times daily. 120 tablet 6 08/18/2016 at Unknown time  . TRAVATAN Z 0.004 % SOLN ophthalmic solution Place 1 drop into both eyes at bedtime.    08/17/2016 at Unknown time  . umeclidinium bromide (INCRUSE ELLIPTA) 62.5 MCG/INH AEPB Inhale 1 puff into the lungs daily.   08/18/2016 at Unknown time  . warfarin (COUMADIN) 5 MG tablet TAKE 1 TABLET BY MOUTH DAILY EXCEPT 1/2 TABLET ON WEDNESDAYS AND SATURDAYS. 30 tablet 6 08/17/2016 at 2130   Assessment: 83yo female on chronic Coumadin PTA for h/o afib.  Warfarin previously held due to elevated INR and anemia.  OK to  restart per cardiology on 7/17.  INR below goal again today.  Goal of Therapy:  INR 2-3 Monitor platelets by anticoagulation protocol: Yes   Plan:  Repeat Coumadin 5mg  today x 1  INR daily Monitor for signs and symptoms of bleeding.   Margo AyeHall, Mathias Bogacki A 08/25/2016,1:19 PM

## 2016-08-25 NOTE — Plan of Care (Signed)
Problem: Pain Managment: Goal: General experience of comfort will improve Outcome: Adequate for Discharge Pt c/o pain in R leg, Given Morphine x1 this shift. Pt now resting quietly in room. Pt educated on pain mgmt and medications available. Will continue to monitor pt

## 2016-08-25 NOTE — Progress Notes (Signed)
Pt given Tylenol 650mg  d/t fever of 100.7 oral. Will continue to monitor pt

## 2016-08-25 NOTE — Evaluation (Signed)
Physical Therapy Evaluation Patient Details Name: Melissa [name] MRN: [id number] DOB: 06-19-1930 Today's Date: 08/25/2016   History of Present Illness  Melissa [name] is an 83 year old lady who lives in [place] and is currently hospitalized at [facility] for treatment of an acute exacerbation of chronic diastolic heart failure. She has chronic atrial fibrillation and is on coumadin, has a history of coronary artery disease and underwent intervention in October of 2017. Melissa [name] has stage III chronic kidney disease  Clinical Impression  [name] was receiving home health services prior to admission.  She has decreased strength, balance and mobilty and will continue to benefit from home health services at discharge.     Follow Up Recommendations Home health PT    Equipment Recommendations  None recommended by PT    Recommendations for Other Services   none    Precautions / Restrictions Precautions Precautions: None Restrictions Weight Bearing Restrictions: No      Mobility  Bed Mobility Overal bed mobility: Modified Independent                Transfers Overall transfer level: Needs assistance   Transfers: Sit to/from Stand Sit to Stand: Min to moderate  assist            Ambulation/Gait Ambulation/Gait assistance: Supervision Ambulation Distance (Feet): 20 Feet Assistive device: Standard walker Gait Pattern/deviations: Decreased step length - right;Decreased step length - left   Gait velocity interpretation: <1.8 ft/sec, indicative of risk for recurrent falls              Pertinent Vitals/Pain Pain Assessment: 0-10 Pain Score: 5  Pain Location: Rt foot Pain Descriptors / Indicators: Aching Pain Intervention(s): Limited activity within patient's tolerance    Home Living Family/patient expects to be discharged to:: Private residence Living Arrangements: Children Available Help at Discharge: Family Type of Home:  House Home Access: Stairs to enter Entrance Stairs-Rails: Right Entrance Stairs-Number of Steps: 1 Home Layout: One level Home Equipment: Environmental consultant - 2 wheels;Cane - single point      Prior Function Level of Independence: Needs assistance   Gait / Transfers Assistance Needed: normally uses a cane but will need to use her walker   ADL's / Homemaking Assistance Needed: Assist from family           Extremity/Trunk Assessment        Lower Extremity Assessment Lower Extremity Assessment: Generalized weakness       Communication   Communication: HOH  Cognition Arousal/Alertness: Awake/alert Behavior During Therapy: WFL for tasks assessed/performed Overall Cognitive Status: Within Functional Limits for tasks assessed                                           Exercises General Exercises - Lower Extremity Ankle Circles/Pumps: Both;10 reps Short Arc Quad: 5 reps;Both Hip ABduction/ADduction: Both;Seated   Assessment/Plan    PT Assessment Patient needs continued PT services  PT Problem List Decreased balance;Decreased activity tolerance;Decreased strength       PT Treatment Interventions Gait training;Therapeutic exercise    PT Goals (Current goals can be found in the Care Plan section)       Frequency Min 3X/week           AM-PAC PT "6 Clicks" Daily Activity  Outcome Measure Difficulty turning over in bed (including adjusting bedclothes, sheets and blankets)?: A Little Difficulty  moving from lying on back to sitting on the side of the bed? : A Little Difficulty sitting down on and standing up from a chair with arms (e.g., wheelchair, bedside commode, etc,.)?: A Lot Help needed moving to and from a bed to chair (including a wheelchair)?: A Little Help needed walking in hospital room?: A Little Help needed climbing 3-5 steps with a railing? : A Lot 6 Click Score: 16    End of Session Equipment Utilized During Treatment: Gait belt Activity  Tolerance: Patient tolerated treatment well Patient left: in bed;with call bell/phone within reach;with bed alarm set Nurse Communication: Mobility status PT Visit Diagnosis: Unsteadiness on feet (R26.81);Repeated falls (R29.6)    Time: 1310-1336 PT Time Calculation (min) (ACUTE ONLY): 26 min   Charges:   PT Evaluation $PT Eval Moderate Complexity: 1 Procedure     PT G CodesVirgina Organ:        Cynthia Russell, PT CLT [phone] 08/25/2016, 1:36 PM

## 2016-08-25 NOTE — Progress Notes (Addendum)
Patient out of bed to chair, used standard walker, some weakness not,but patient tolerated ambulation well. Family at bedside. Will continue to monitor patient. No c/o pain or discomfort noted.

## 2016-08-26 ENCOUNTER — Inpatient Hospital Stay (HOSPITAL_COMMUNITY): Payer: Medicare Other

## 2016-08-26 DIAGNOSIS — D72829 Elevated white blood cell count, unspecified: Secondary | ICD-10-CM

## 2016-08-26 DIAGNOSIS — R319 Hematuria, unspecified: Secondary | ICD-10-CM

## 2016-08-26 DIAGNOSIS — R509 Fever, unspecified: Secondary | ICD-10-CM

## 2016-08-26 LAB — BASIC METABOLIC PANEL
ANION GAP: 10 (ref 5–15)
BUN: 78 mg/dL — ABNORMAL HIGH (ref 6–20)
CALCIUM: 7.9 mg/dL — AB (ref 8.9–10.3)
CO2: 30 mmol/L (ref 22–32)
Chloride: 100 mmol/L — ABNORMAL LOW (ref 101–111)
Creatinine, Ser: 1.58 mg/dL — ABNORMAL HIGH (ref 0.44–1.00)
GFR calc Af Amer: 34 mL/min — ABNORMAL LOW (ref >60)
GFR, EST NON AFRICAN AMERICAN: 29 mL/min — AB (ref >60)
Glucose, Bld: 30 mg/dL — CL (ref 65–99)
POTASSIUM: 3.5 mmol/L (ref 3.5–5.1)
SODIUM: 140 mmol/L (ref 135–145)

## 2016-08-26 LAB — GLUCOSE, CAPILLARY
GLUCOSE-CAPILLARY: 29 mg/dL — AB (ref 65–99)
GLUCOSE-CAPILLARY: 98 mg/dL (ref 65–99)
GLUCOSE-CAPILLARY: 124 mg/dL — AB (ref 65–99)
GLUCOSE-CAPILLARY: 134 mg/dL — AB (ref 65–99)
GLUCOSE-CAPILLARY: 116 mg/dL — AB (ref 65–99)

## 2016-08-26 LAB — CBC
HCT: 28.2 % — ABNORMAL LOW (ref 36.0–46.0)
Hemoglobin: 9.2 g/dL — ABNORMAL LOW (ref 12.0–15.0)
MCH: 30.9 pg (ref 26.0–34.0)
MCHC: 32.6 g/dL (ref 30.0–36.0)
MCV: 94.6 fL (ref 78.0–100.0)
PLATELETS: 293 10*3/uL (ref 150–400)
RBC: 2.98 MIL/uL — AB (ref 3.87–5.11)
RDW: 15.4 % (ref 11.5–15.5)
WBC: 13.3 10*3/uL — AB (ref 4.0–10.5)

## 2016-08-26 LAB — PROTIME-INR
INR: 1.63
PROTHROMBIN TIME: 19.5 s — AB (ref 11.4–15.2)

## 2016-08-26 MED ORDER — INSULIN DETEMIR 100 UNIT/ML ~~LOC~~ SOLN
10.0000 [IU] | Freq: Every day | SUBCUTANEOUS | Status: DC
  Administered 2016-08-26: 10 [IU] via SUBCUTANEOUS
  Filled 2016-08-26 (×3): qty 0.1

## 2016-08-26 MED ORDER — DEXTROSE 50 % IV SOLN
INTRAVENOUS | Status: AC
  Administered 2016-08-26: 50 mL
  Filled 2016-08-26: qty 50

## 2016-08-26 MED ORDER — AMLODIPINE BESYLATE 5 MG PO TABS
5.0000 mg | ORAL_TABLET | Freq: Every day | ORAL | Status: DC
  Administered 2016-08-26 – 2016-08-29 (×4): 5 mg via ORAL
  Filled 2016-08-26 (×4): qty 1

## 2016-08-26 MED ORDER — WARFARIN SODIUM 5 MG PO TABS
5.0000 mg | ORAL_TABLET | Freq: Once | ORAL | Status: AC
  Administered 2016-08-26: 5 mg via ORAL
  Filled 2016-08-26: qty 1

## 2016-08-26 MED ORDER — SULFAMETHOXAZOLE-TRIMETHOPRIM 800-160 MG PO TABS
0.5000 | ORAL_TABLET | Freq: Two times a day (BID) | ORAL | Status: AC
  Administered 2016-08-26 – 2016-08-28 (×6): 0.5 via ORAL
  Filled 2016-08-26 (×6): qty 1

## 2016-08-26 MED ORDER — SULFAMETHOXAZOLE-TRIMETHOPRIM 400-80 MG PO TABS
1.0000 | ORAL_TABLET | Freq: Two times a day (BID) | ORAL | Status: DC
  Filled 2016-08-26 (×6): qty 1

## 2016-08-26 NOTE — Progress Notes (Signed)
Progress Note  Patient Name: Melissa [name] Date of Encounter: 08/26/2016  Primary Cardiologist: Diona Browner  Subjective  Complaining of right leg pain.    Inpatient Medications    Scheduled Meds: . amLODipine  2.5 mg Oral Daily  . atorvastatin  40 mg Oral q1800  . clopidogrel  75 mg Oral Daily  . furosemide  80 mg Intravenous BID  . glimepiride  2 mg Oral Daily  . hydrALAZINE  100 mg Oral TID  . insulin aspart  0-15 Units Subcutaneous TID WC  . insulin aspart  0-5 Units Subcutaneous QHS  . insulin aspart  4 Units Subcutaneous TID WC  . insulin detemir  15 Units Subcutaneous QHS  . ipratropium-albuterol  3 mL Nebulization TID  . isosorbide mononitrate  90 mg Oral Daily  . latanoprost  1 drop Both Eyes QHS  . pantoprazole  40 mg Oral Q0600  . sodium chloride flush  3 mL Intravenous Q12H  . Warfarin - Pharmacist Dosing Inpatient   Does not apply Q24H   Continuous Infusions: . sodium chloride     PRN Meds: sodium chloride, acetaminophen, albuterol, ALPRAZolam, bisacodyl, bisacodyl, magnesium hydroxide, metoprolol tartrate, morphine injection, ondansetron (ZOFRAN) IV, sodium chloride flush   Vital Signs    Vitals:   08/25/16 2323 08/26/16 0352 08/26/16 0355 08/26/16 0752  BP:  (!) 152/79    Pulse:  89    Resp:  20    Temp: 99.6 F (37.6 C) 99.4 F (37.4 C)    TempSrc: Oral Oral    SpO2:  96%  98%  Weight:   211 lb 11.2 oz (96 kg)   Height:        Intake/Output Summary (Last 24 hours) at 08/26/16 0806 Last data filed at 08/26/16 0352  Gross per 24 hour  Intake              200 ml  Output             2900 ml  Net            -2700 ml   Filed Weights   08/24/16 0500 08/25/16 0500 08/26/16 0355  Weight: 220 lb 0.3 oz (99.8 kg) 214 lb 9.6 oz (97.3 kg) 211 lb 11.2 oz (96 kg)    Telemetry    Atrial fib rates in the 70's and 80';s.  Personally Reviewed  Physical Exam   GEN: Complaining of right leg pain.    Neck: No JVD Cardiac: IRRR, no murmurs, rubs,  or gallops.  Respiratory: Clear to auscultation bilaterally. GI: Soft, nontender, non-distended  MS: 1+ edema bilateral LE,; No deformity. Neuro:  Nonfocal  Psych: Normal affect   Labs    Chemistry Recent Labs Lab 08/24/16 0004 08/24/16 0551 08/26/16 0450  NA 137 139 140  K 3.8 3.7 3.5  CL 99* 100* 100*  CO2 27 28 30   GLUCOSE 322* 201* 30*  BUN 92* 88* 78*  CREATININE 2.08* 1.89* 1.58*  CALCIUM 7.7* 7.8* 7.9*  GFRNONAA 21* 23* 29*  GFRAA 24* 27* 34*  ANIONGAP 11 11 10      Hematology Recent Labs Lab 08/23/16 0511 08/25/16 0930 08/26/16 0450  WBC 7.8 10.0 13.3*  RBC 2.77* 2.90* 2.98*  HGB 8.8* 9.1* 9.2*  HCT 26.5* 27.8* 28.2*  MCV 95.7 95.9 94.6  MCH 31.8 31.4 30.9  MCHC 33.2 32.7 32.6  RDW 15.3 15.2 15.4  PLT 323 307 293    Cardiac Enzymes Recent Labs Lab 08/19/16 1002 08/19/16 1625 08/19/16  2246  TROPONINI 0.06* 0.10* 0.13*    Radiology    No results found.  Cardiac Studies   Echocardiogram 08/19/2016 Left ventricle: The cavity size was normal. Wall thickness was increased in a pattern of moderate LVH. Systolic function was normal. The estimated ejection fraction was in the range of 60% to 65%. - Aortic valve: Moderately calcified annulus. Trileaflet; moderately thickened leaflets. Valve area (VTI): 2.11 cm^2. Valve area (Vmax): 1.68 cm^2. Valve area (Vmean): 1.61 cm^2. - Mitral valve: There was mild regurgitation. - Left atrium: The atrium was massively dilated. - Right ventricle: The ventricular septum is flattened in systole and diastole suggesting RV pressure and volume overload. The cavity size was moderately dilated. - Right atrium: The atrium was massively dilated. - Atrial septum: No defect or patent foramen ovale was identified. - Tricuspid valve: There was moderate-severe regurgitation. There is hepatic systolic flow reversal sugesting severe TR. - Pulmonary arteries: Systolic pressure was severely increased.  PA peak pressure: 77 mm Hg (S). - Technically adequate study.  Patient Profile     83 y.o. female  hx of chronic diastolic heart failure, chronic atrial fibrillation on coumadin, coronary artery disease with high risk DES intervention to the circumflex in 11/2015, chronic kidney disease stage IIIwho is being seen today for the evaluation of chest pain and dyspnea at the request of Dr. [name]. Diagnosed with acute on chronic diastolic CHF.   Assessment & Plan    1. Atrial fib: Heart rate is controlled. Remains on coumadin per pharmacy. INR 1.63 this am.   2. Worsening right leg pain: Doubt DVT as she is on anticoagulation, although not yet therapeutic.She is having a doppler ultrasound this am to r/o DVT.   3. Acute on Chronic Diastolic CHF: Continues to have mild LEE. She was to have TED hose placed for assistance in venous return, yesterday, but leg pain prohibited their placement. She continues on Lasix 80 mg IV BID. She has diuresed 10 liters since admission with weight down from 224 lbs at highest recorded weight to 211 lbs this am. Review of labs has creatinine at 1.58, improved from 1.89 yesterday. CO2 30. Continue diureses. Uncertain dry weight, as she has been gaining weight over the last 6 months. Weight in  March of 2018 155 lbs.   4. CAD: DES to Cx. Continues on Plavix but not on ASA due to coumadin therapy. On po nitrates.   5. CKD Stage III; Creatinine improving since admission.   Signed, Bettey MareKathryn M. Liborio NixonLawrence DNP, ANP, AACC   08/26/2016, 8:06 AM    The patient was seen and examined, and I agree with the history, physical exam, assessment and plan as documented above, with modifications as noted below.  Bilateral leg swelling is improving and she has had a good response to IV Lasix (2700 cc out in last 24 hrs). BUN and creatinine are also trending down. He had some right leg pain and Dopplers were negative for DVT.  She had a low grade fever last night and has a  leukocytosis this morning. She has had a Foley catheter in for several days. The urine is blood tinged and I spoke with the nurse and she believes she saw pus as well. She is at high risk for nosocomial UTI.  Recommendations: I will order urinalysis and culture. I have asked the nurse to remove the Foley catheter. She has some suprapubic tenderness and given her leukocytosis, hematuria, and low grade fever, I would consider treating her for a nosocomial UTI (  perhaps Bactrim DS 1 tab bid x 3 days). I have ordered urinalysis and urine culture.  Otherwise, continue IV diuresis with Lasix 80 mg bid. As she is hypertensive, I will increase amlodipine to 5 mg daily.   Prentice Docker, MD, [facility]  08/26/2016 10:16 AM

## 2016-08-26 NOTE — Progress Notes (Signed)
RN called and spoke with Dr. Karilyn CotaGosrani about pts symptoms of R leg pain. New order Venous Doppler of the R lower leg first thing in the morning. Pt made aware, given pain medication again. Will continue to monitor pt.

## 2016-08-26 NOTE — Progress Notes (Signed)
Dr. Christoper AllegraGoshrani paged and made aware of pts worsening R leg pain. Adv MD that pain is much more severe than previous shift, and + Homans sign. Will continue to monitor pt and wait for Drs call back

## 2016-08-26 NOTE — Plan of Care (Signed)
Problem: Pain Managment: Goal: General experience of comfort will improve Outcome: Not Progressing Pts R leg pain progressively worse than previous shift. Morphine x3 given this shift, Dr. Marton RedwoodGoshani made aware of positive Homans sign and increasing pain. Pt for venous doppler in am to R/O DVT. Pt educated on plan of care as well as pain mgmt and medications. Pt verbalized understanding. Will continue to monitor pt

## 2016-08-26 NOTE — Progress Notes (Signed)
Inpatient Diabetes Program Recommendations  AACE/ADA: New Consensus Statement on Inpatient Glycemic Control (2015)  Target Ranges:  Prepandial:   less than 140 mg/dL      Peak postprandial:   less than 180 mg/dL (1-2 hours)      Critically ill patients:  140 - 180 mg/dL   Results for Melissa [name], Melissa [name] (MRN [id number]) as of 08/26/2016 08:41  Ref. Range 08/25/2016 07:51 08/25/2016 11:53 08/25/2016 16:06 08/25/2016 20:27 08/26/2016 07:19 08/26/2016 07:42  Glucose-Capillary Latest Ref Range: 65 - 99 mg/dL 782103 (H) 956125 (H) 213189 (H) 135 (H) 29 (LL) 98   Review of Glycemic Control  Current orders for Inpatient glycemic control: Amaryl 2 mg daily, Levemir 15 units QHS, Novolog 0-15 units TID with meals, Novolog 0-5 units QHS, Novolog 4 units TID with meals  Inpatient Diabetes Program Recommendations: Insulin - Basal: Fasting glucose 29 mg/dl this morning. Please consider decreasing Levemir to 10 units QHS. Oral Agents: Please discontinue Amaryl while inpatient. Insulin-Meal Coverage: May want to consider discontinuing meal coverage since steroids have been discontinued.  Thanks, Orlando PennerMarie Evelene Roussin, RN, MSN, CDE Diabetes Coordinator Inpatient Diabetes Program [phone] (Team Pager from 8am to 5pm)

## 2016-08-26 NOTE — Progress Notes (Signed)
ANTICOAGULATION CONSULT NOTE  Pharmacy Consult for COUMADIN (home med) Indication: atrial fibrillation  No Known Allergies  Patient Measurements: Height: 5\' 4"  (162.6 cm) Weight: 211 lb 11.2 oz (96 kg) IBW/kg (Calculated) : 54.7  Vital Signs: Temp: 99.4 F (37.4 C) (07/20 0352) Temp Source: Oral (07/20 0352) BP: 152/79 (07/20 0352) Pulse Rate: 89 (07/20 0352)  Labs:  Recent Labs  08/24/16 0004 08/24/16 0551 08/25/16 0930 08/25/16 1033 08/26/16 0450  HGB  --   --  9.1*  --  9.2*  HCT  --   --  27.8*  --  28.2*  PLT  --   --  307  --  293  LABPROT  --  17.8*  --  18.1* 19.5*  INR  --  1.45  --  1.48 1.63  CREATININE 2.08* 1.89*  --   --  1.58*   Estimated Creatinine Clearance: 30.3 mL/min (A) (by C-G formula based on SCr of 1.58 mg/dL (H)).  Medical History: Past Medical History:  Diagnosis Date  . Atrial fibrillation (HCC)   . CAD (coronary artery disease)    Multivessel status post high risk PCI/DES to circumflex 11/2015 (poor candidate for CABG)  . CHF (congestive heart failure) (HCC)   . CKD (chronic kidney disease) stage 3, GFR 30-59 ml/min   . DDD (degenerative disc disease), lumbar   . Essential hypertension, benign   . Mixed hyperlipidemia   . Type 2 diabetes mellitus (HCC)    Medications:  Prescriptions Prior to Admission  Medication Sig Dispense Refill Last Dose  . acetaminophen (TYLENOL) 325 MG tablet Take 2 tablets (650 mg total) by mouth every 4 (four) hours as needed for mild pain, moderate pain, fever or headache.   unknown  . ALPRAZolam (XANAX) 0.25 MG tablet Take 0.25 mg by mouth 3 (three) times daily as needed.   unknown  . amLODipine (NORVASC) 2.5 MG tablet TAKE (1) TABLET BY MOUTH DAILY. 30 tablet 6 08/18/2016 at Unknown time  . atorvastatin (LIPITOR) 40 MG tablet Take 1 tablet (40 mg total) by mouth daily. 90 tablet 3 08/18/2016 at Unknown time  . clopidogrel (PLAVIX) 75 MG tablet Take 1 tablet (75 mg total) by mouth daily. 90 tablet 3  08/18/2016 at Unknown time  . glimepiride (AMARYL) 2 MG tablet Take 2 mg by mouth daily.     08/18/2016 at Unknown time  . hydrALAZINE (APRESOLINE) 100 MG tablet Take 1 tablet (100 mg total) by mouth 3 (three) times daily. 270 tablet 3 08/18/2016 at Unknown time  . HYDROcodone-acetaminophen (NORCO/VICODIN) 5-325 MG tablet Take 1 tablet by mouth 4 (four) times daily.    08/18/2016 at Unknown time  . insulin detemir (LEVEMIR) 100 UNIT/ML injection Inject 0.2 mLs (20 Units total) into the skin at bedtime. 10 mL 11 08/17/2016 at Unknown time  . insulin lispro (HUMALOG) 100 UNIT/ML injection Inject 0-0.15 mLs (0-15 Units total) into the skin 3 (three) times daily with meals. Use as sliding scale 10 mL 11 08/18/2016 at Unknown time  . isosorbide mononitrate (IMDUR) 60 MG 24 hr tablet Take 1 tablet (60 mg total) by mouth daily. 90 tablet 3 08/18/2016 at Unknown time  . nitroGLYCERIN (NITROSTAT) 0.4 MG SL tablet Place 1 tablet (0.4 mg total) under the tongue every 5 (five) minutes as needed for chest pain. 25 tablet 2 unknown  . pantoprazole (PROTONIX) 40 MG tablet Take 1 tablet (40 mg total) by mouth daily at 6 (six) AM. 90 tablet 3 08/18/2016 at Unknown time  .  PROAIR HFA 108 (90 Base) MCG/ACT inhaler Inhale 1-2 puffs into the lungs 4 (four) times daily.    08/18/2016 at Unknown time  . torsemide (DEMADEX) 20 MG tablet Take 2 tablets (40 mg total) by mouth 2 (two) times daily. 120 tablet 6 08/18/2016 at Unknown time  . TRAVATAN Z 0.004 % SOLN ophthalmic solution Place 1 drop into both eyes at bedtime.    08/17/2016 at Unknown time  . umeclidinium bromide (INCRUSE ELLIPTA) 62.5 MCG/INH AEPB Inhale 1 puff into the lungs daily.   08/18/2016 at Unknown time  . warfarin (COUMADIN) 5 MG tablet TAKE 1 TABLET BY MOUTH DAILY EXCEPT 1/2 TABLET ON WEDNESDAYS AND SATURDAYS. 30 tablet 6 08/17/2016 at 2130   Assessment: 83yo female on chronic Coumadin PTA for h/o afib.  Warfarin previously held due to elevated INR and anemia.  OK to  restart per cardiology on 7/17.  INR below goal again today.  Goal of Therapy:  INR 2-3 Monitor platelets by anticoagulation protocol: Yes   Plan:  Repeat Coumadin 5mg  today x 1  INR daily Monitor for signs and symptoms of bleeding.   Melissa [name] 08/26/2016,9:06 AM

## 2016-08-26 NOTE — Progress Notes (Signed)
Subjective: She says she feels okay. She had some trouble with right leg pain last night. She has no other new complaints. Her blood sugar was low this morning.  Objective: Vital signs in last 24 hours: Temp:  [99.4 F (37.4 C)-100.7 F (38.2 C)] 99.4 F (37.4 C) (07/20 0352) Pulse Rate:  [88-89] 89 (07/20 0352) Resp:  [18-20] 20 (07/20 0352) BP: (148-152)/(68-79) 152/79 (07/20 0352) SpO2:  [93 %-99 %] 98 % (07/20 0752) Weight:  [96 kg (211 lb 11.2 oz)] 96 kg (211 lb 11.2 oz) (07/20 0355) Weight change: -1.315 kg (-2 lb 14.4 oz) Last BM Date: 08/20/16  Intake/Output from previous day: 07/19 0701 - 07/20 0700 In: 200 [P.O.:200] Out: 2900 [Urine:2900]  PHYSICAL EXAM General appearance: alert, cooperative and mild distress Resp: rhonchi bilaterally Cardio: irregularly irregular rhythm GI: soft, non-tender; bowel sounds normal; no masses,  no organomegaly Extremities: She still has edema in her legs and her arms No tenderness in her right leg. No cord.  Lab Results:  Results for orders placed or performed during the hospital encounter of 08/18/16 (from the past 48 hour(s))  Glucose, capillary     Status: Abnormal   Collection Time: 08/24/16 11:12 AM  Result Value Ref Range   Glucose-Capillary 149 (H) 65 - 99 mg/dL  Glucose, capillary     Status: Abnormal   Collection Time: 08/24/16  5:23 PM  Result Value Ref Range   Glucose-Capillary 142 (H) 65 - 99 mg/dL   Comment 1 Notify RN   Glucose, capillary     Status: Abnormal   Collection Time: 08/24/16  8:37 PM  Result Value Ref Range   Glucose-Capillary 181 (H) 65 - 99 mg/dL   Comment 1 Notify RN    Comment 2 Document in Chart   Glucose, capillary     Status: Abnormal   Collection Time: 08/25/16  7:51 AM  Result Value Ref Range   Glucose-Capillary 103 (H) 65 - 99 mg/dL  CBC with Differential/Platelet     Status: Abnormal   Collection Time: 08/25/16  9:30 AM  Result Value Ref Range   WBC 10.0 4.0 - 10.5 K/uL   RBC 2.90  (L) 3.87 - 5.11 MIL/uL   Hemoglobin 9.1 (L) 12.0 - 15.0 g/dL   HCT 27.8 (L) 36.0 - 46.0 %   MCV 95.9 78.0 - 100.0 fL   MCH 31.4 26.0 - 34.0 pg   MCHC 32.7 30.0 - 36.0 g/dL   RDW 15.2 11.5 - 15.5 %   Platelets 307 150 - 400 K/uL   Neutrophils Relative % 79 %   Neutro Abs 7.9 (H) 1.7 - 7.7 K/uL   Lymphocytes Relative 9 %   Lymphs Abs 0.9 0.7 - 4.0 K/uL   Monocytes Relative 10 %   Monocytes Absolute 1.0 0.1 - 1.0 K/uL   Eosinophils Relative 2 %   Eosinophils Absolute 0.2 0.0 - 0.7 K/uL   Basophils Relative 0 %   Basophils Absolute 0.0 0.0 - 0.1 K/uL  Protime-INR     Status: Abnormal   Collection Time: 08/25/16 10:33 AM  Result Value Ref Range   Prothrombin Time 18.1 (H) 11.4 - 15.2 seconds   INR 1.48   Glucose, capillary     Status: Abnormal   Collection Time: 08/25/16 11:53 AM  Result Value Ref Range   Glucose-Capillary 125 (H) 65 - 99 mg/dL  Glucose, capillary     Status: Abnormal   Collection Time: 08/25/16  4:06 PM  Result Value Ref Range  Glucose-Capillary 189 (H) 65 - 99 mg/dL   Comment 1 Notify RN    Comment 2 Document in Chart   Glucose, capillary     Status: Abnormal   Collection Time: 08/25/16  8:27 PM  Result Value Ref Range   Glucose-Capillary 135 (H) 65 - 99 mg/dL   Comment 1 Notify RN    Comment 2 Document in Chart   Protime-INR     Status: Abnormal   Collection Time: 08/26/16  4:50 AM  Result Value Ref Range   Prothrombin Time 19.5 (H) 11.4 - 15.2 seconds   INR 1.63   CBC     Status: Abnormal   Collection Time: 08/26/16  4:50 AM  Result Value Ref Range   WBC 13.3 (H) 4.0 - 10.5 K/uL   RBC 2.98 (L) 3.87 - 5.11 MIL/uL   Hemoglobin 9.2 (L) 12.0 - 15.0 g/dL   HCT 28.2 (L) 36.0 - 46.0 %   MCV 94.6 78.0 - 100.0 fL   MCH 30.9 26.0 - 34.0 pg   MCHC 32.6 30.0 - 36.0 g/dL   RDW 15.4 11.5 - 15.5 %   Platelets 293 150 - 400 K/uL  Basic metabolic panel     Status: Abnormal   Collection Time: 08/26/16  4:50 AM  Result Value Ref Range   Sodium 140 135 - 145  mmol/L   Potassium 3.5 3.5 - 5.1 mmol/L   Chloride 100 (L) 101 - 111 mmol/L   CO2 30 22 - 32 mmol/L   Glucose, Bld 30 (LL) 65 - 99 mg/dL    Comment: CRITICAL RESULT CALLED TO, READ BACK BY AND VERIFIED WITH: AMBURN,A AT 7:10AM ON 08/26/16 BY FESTERMAN,C    BUN 78 (H) 6 - 20 mg/dL   Creatinine, Ser 1.58 (H) 0.44 - 1.00 mg/dL   Calcium 7.9 (L) 8.9 - 10.3 mg/dL   GFR calc non Af Amer 29 (L) >60 mL/min   GFR calc Af Amer 34 (L) >60 mL/min    Comment: (NOTE) The eGFR has been calculated using the CKD EPI equation. This calculation has not been validated in all clinical situations. eGFR's persistently <60 mL/min signify possible Chronic Kidney Disease.    Anion gap 10 5 - 15  Glucose, capillary     Status: Abnormal   Collection Time: 08/26/16  7:19 AM  Result Value Ref Range   Glucose-Capillary 29 (LL) 65 - 99 mg/dL   Comment 1 Notify RN   Glucose, capillary     Status: None   Collection Time: 08/26/16  7:42 AM  Result Value Ref Range   Glucose-Capillary 98 65 - 99 mg/dL    ABGS No results for input(s): PHART, PO2ART, TCO2, HCO3 in the last 72 hours.  Invalid input(s): PCO2 CULTURES Recent Results (from the past 240 hour(s))  MRSA PCR Screening     Status: None   Collection Time: 08/19/16  3:44 PM  Result Value Ref Range Status   MRSA by PCR NEGATIVE NEGATIVE Final    Comment:        The GeneXpert MRSA Assay (FDA approved for NASAL specimens only), is one component of a comprehensive MRSA colonization surveillance program. It is not intended to diagnose MRSA infection nor to guide or monitor treatment for MRSA infections.    Studies/Results: US Venous Img Lower Unilateral Right  Result Date: 08/26/2016 CLINICAL DATA:  83 year old female with history of right lower extremity pain. Chronic venous insufficiency. EXAM: RIGHT LOWER EXTREMITY VENOUS DOPPLER ULTRASOUND TECHNIQUE: Gray-scale sonography with graded  compression, as well as color Doppler and duplex ultrasound  were performed to evaluate the lower extremity deep venous systems from the level of the common femoral vein and including the common femoral, femoral, profunda femoral, popliteal and calf veins including the posterior tibial, peroneal and gastrocnemius veins when visible. The superficial great saphenous vein was also interrogated. Spectral Doppler was utilized to evaluate flow at rest and with distal augmentation maneuvers in the common femoral, femoral and popliteal veins. COMPARISON:  None. FINDINGS: Contralateral Common Femoral Vein: Respiratory phasicity is normal and symmetric with the symptomatic side. No evidence of thrombus. Normal compressibility. Common Femoral Vein: No evidence of thrombus. Normal compressibility, respiratory phasicity and response to augmentation. Saphenofemoral Junction: No evidence of thrombus. Normal compressibility and flow on color Doppler imaging. Profunda Femoral Vein: No evidence of thrombus. Normal compressibility and flow on color Doppler imaging. Femoral Vein: No evidence of thrombus. Normal compressibility, respiratory phasicity and response to augmentation. Popliteal Vein: No evidence of thrombus. Normal compressibility, respiratory phasicity and response to augmentation. Calf Veins: No evidence of thrombus. Normal compressibility and flow on color Doppler imaging. Superficial Great Saphenous Vein: No evidence of thrombus. Normal compressibility and flow on color Doppler imaging. Venous Reflux:  None. Other Findings: In the left groin there is a 4.9 x 2.8 x 5.3 cm lesion that is predominantly anechoic with increased through transmission, however, there appears to be some complex internal echoes with an appearance suggestive of a pedunculated soft tissue component. On some images, there is a suggestion of internal flow, however, after discussion with sonographer, this is favored to be artifactual. IMPRESSION: 1. No evidence of DVT within the right lower extremity. 2. Complex  cystic lesion in the left groin, as discussed above. This is of uncertain etiology and significance, but is favored to represent either a a complex seroma, old chronic complex hematoma, or left inguinal or femoral hernia containing fluid. Electronically Signed   By: Vinnie Langton M.D.   On: 08/26/2016 08:53    Medications:  Prior to Admission:  Prescriptions Prior to Admission  Medication Sig Dispense Refill Last Dose  . acetaminophen (TYLENOL) 325 MG tablet Take 2 tablets (650 mg total) by mouth every 4 (four) hours as needed for mild pain, moderate pain, fever or headache.   unknown  . ALPRAZolam (XANAX) 0.25 MG tablet Take 0.25 mg by mouth 3 (three) times daily as needed.   unknown  . amLODipine (NORVASC) 2.5 MG tablet TAKE (1) TABLET BY MOUTH DAILY. 30 tablet 6 08/18/2016 at Unknown time  . atorvastatin (LIPITOR) 40 MG tablet Take 1 tablet (40 mg total) by mouth daily. 90 tablet 3 08/18/2016 at Unknown time  . clopidogrel (PLAVIX) 75 MG tablet Take 1 tablet (75 mg total) by mouth daily. 90 tablet 3 08/18/2016 at Unknown time  . glimepiride (AMARYL) 2 MG tablet Take 2 mg by mouth daily.     08/18/2016 at Unknown time  . hydrALAZINE (APRESOLINE) 100 MG tablet Take 1 tablet (100 mg total) by mouth 3 (three) times daily. 270 tablet 3 08/18/2016 at Unknown time  . HYDROcodone-acetaminophen (NORCO/VICODIN) 5-325 MG tablet Take 1 tablet by mouth 4 (four) times daily.    08/18/2016 at Unknown time  . insulin detemir (LEVEMIR) 100 UNIT/ML injection Inject 0.2 mLs (20 Units total) into the skin at bedtime. 10 mL 11 08/17/2016 at Unknown time  . insulin lispro (HUMALOG) 100 UNIT/ML injection Inject 0-0.15 mLs (0-15 Units total) into the skin 3 (three) times daily with meals. Use as  sliding scale 10 mL 11 08/18/2016 at Unknown time  . isosorbide mononitrate (IMDUR) 60 MG 24 hr tablet Take 1 tablet (60 mg total) by mouth daily. 90 tablet 3 08/18/2016 at Unknown time  . nitroGLYCERIN (NITROSTAT) 0.4 MG SL tablet  Place 1 tablet (0.4 mg total) under the tongue every 5 (five) minutes as needed for chest pain. 25 tablet 2 unknown  . pantoprazole (PROTONIX) 40 MG tablet Take 1 tablet (40 mg total) by mouth daily at 6 (six) AM. 90 tablet 3 08/18/2016 at Unknown time  . PROAIR HFA 108 (90 Base) MCG/ACT inhaler Inhale 1-2 puffs into the lungs 4 (four) times daily.    08/18/2016 at Unknown time  . torsemide (DEMADEX) 20 MG tablet Take 2 tablets (40 mg total) by mouth 2 (two) times daily. 120 tablet 6 08/18/2016 at Unknown time  . TRAVATAN Z 0.004 % SOLN ophthalmic solution Place 1 drop into both eyes at bedtime.    08/17/2016 at Unknown time  . umeclidinium bromide (INCRUSE ELLIPTA) 62.5 MCG/INH AEPB Inhale 1 puff into the lungs daily.   08/18/2016 at Unknown time  . warfarin (COUMADIN) 5 MG tablet TAKE 1 TABLET BY MOUTH DAILY EXCEPT 1/2 TABLET ON WEDNESDAYS AND SATURDAYS. 30 tablet 6 08/17/2016 at 2130   Scheduled: . amLODipine  2.5 mg Oral Daily  . atorvastatin  40 mg Oral q1800  . clopidogrel  75 mg Oral Daily  . furosemide  80 mg Intravenous BID  . hydrALAZINE  100 mg Oral TID  . insulin aspart  0-15 Units Subcutaneous TID WC  . insulin aspart  0-5 Units Subcutaneous QHS  . insulin aspart  4 Units Subcutaneous TID WC  . insulin detemir  10 Units Subcutaneous QHS  . ipratropium-albuterol  3 mL Nebulization TID  . isosorbide mononitrate  90 mg Oral Daily  . latanoprost  1 drop Both Eyes QHS  . pantoprazole  40 mg Oral Q0600  . sodium chloride flush  3 mL Intravenous Q12H  . Warfarin - Pharmacist Dosing Inpatient   Does not apply Q24H   Continuous: . sodium chloride     GHW:EXHBZJ chloride, acetaminophen, albuterol, ALPRAZolam, bisacodyl, bisacodyl, magnesium hydroxide, metoprolol tartrate, morphine injection, ondansetron (ZOFRAN) IV, sodium chloride flush  Assesment: She has acute on chronic diastolic heart failure. She is down about 10 L. She still has some fluid. Her blood sugar was low this morning and I  have adjusted her medications. She is complaining of right leg pain and had a Doppler done that does not show a clot. She has chronic atrial fib. She has anemia potentially related to her chronic kidney disease. Principal Problem:   Acute on chronic diastolic CHF (congestive heart failure) (HCC) Active Problems:   Essential hypertension, benign   Persistent atrial fibrillation ([place])   Long term current use of anticoagulant   Chronic kidney disease (CKD), stage IV (severe) (HCC)   Status post coronary artery stent placement   Diabetes mellitus type 2, controlled ([place])   Acute on chronic diastolic (congestive) heart failure ([place])    Plan: Continue treatments    LOS: 7 days   , L 08/26/2016, 9:00 AM

## 2016-08-26 NOTE — Progress Notes (Signed)
Pt continues to complain of R leg pain and in room moaning. Sticky note left for Dr. [name] to please address this issue. Pt states it is new as of Tuesday night.

## 2016-08-26 NOTE — Care Management Important Message (Signed)
Important Message  Patient Details  Name: Melissa [name] MRN: [id number] Date of Birth: [birth year]   Medicare Important Message Given:  Yes    Vianny Schraeder, Chrystine OilerSharley Diane, RN 08/26/2016, 3:51 PM

## 2016-08-27 LAB — BASIC METABOLIC PANEL
Anion gap: 9 (ref 5–15)
BUN: 69 mg/dL — AB (ref 6–20)
CALCIUM: 7.9 mg/dL — AB (ref 8.9–10.3)
CO2: 31 mmol/L (ref 22–32)
CREATININE: 1.63 mg/dL — AB (ref 0.44–1.00)
Chloride: 102 mmol/L (ref 101–111)
GFR calc Af Amer: 33 mL/min — ABNORMAL LOW (ref >60)
GFR, EST NON AFRICAN AMERICAN: 28 mL/min — AB (ref >60)
Glucose, Bld: 34 mg/dL — CL (ref 65–99)
Potassium: 3.3 mmol/L — ABNORMAL LOW (ref 3.5–5.1)
SODIUM: 142 mmol/L (ref 135–145)

## 2016-08-27 LAB — GLUCOSE, CAPILLARY
GLUCOSE-CAPILLARY: 24 mg/dL — AB (ref 65–99)
Glucose-Capillary: 88 mg/dL (ref 65–99)
Glucose-Capillary: 123 mg/dL — ABNORMAL HIGH (ref 65–99)
Glucose-Capillary: 146 mg/dL — ABNORMAL HIGH (ref 65–99)
Glucose-Capillary: 146 mg/dL — ABNORMAL HIGH (ref 65–99)

## 2016-08-27 LAB — URINALYSIS, COMPLETE (UACMP) WITH MICROSCOPIC
Bilirubin Urine: NEGATIVE
Glucose, UA: NEGATIVE mg/dL
KETONES UR: NEGATIVE mg/dL
Nitrite: NEGATIVE
PH: 6 (ref 5.0–8.0)
Protein, ur: 100 mg/dL — AB
SPECIFIC GRAVITY, URINE: 1.01 (ref 1.005–1.030)
Squamous Epithelial / LPF: NONE SEEN

## 2016-08-27 LAB — PROTIME-INR
INR: 1.79
Prothrombin Time: 21 seconds — ABNORMAL HIGH (ref 11.4–15.2)

## 2016-08-27 MED ORDER — DEXTROSE 50 % IV SOLN
50.0000 mL | Freq: Once | INTRAVENOUS | Status: AC
  Administered 2016-08-27: 50 mL via INTRAVENOUS

## 2016-08-27 MED ORDER — DEXTROSE 50 % IV SOLN
INTRAVENOUS | Status: AC
  Filled 2016-08-27: qty 50

## 2016-08-27 MED ORDER — POTASSIUM CHLORIDE CRYS ER 20 MEQ PO TBCR
40.0000 meq | EXTENDED_RELEASE_TABLET | Freq: Once | ORAL | Status: AC
  Administered 2016-08-27: 40 meq via ORAL
  Filled 2016-08-27: qty 2

## 2016-08-27 MED ORDER — IPRATROPIUM-ALBUTEROL 0.5-2.5 (3) MG/3ML IN SOLN
3.0000 mL | Freq: Two times a day (BID) | RESPIRATORY_TRACT | Status: DC
  Administered 2016-08-27 – 2016-09-02 (×12): 3 mL via RESPIRATORY_TRACT
  Filled 2016-08-27 (×12): qty 3

## 2016-08-27 MED ORDER — WARFARIN SODIUM 2 MG PO TABS
2.0000 mg | ORAL_TABLET | Freq: Once | ORAL | Status: AC
  Administered 2016-08-27: 2 mg via ORAL
  Filled 2016-08-27: qty 1

## 2016-08-27 NOTE — Progress Notes (Signed)
ANTICOAGULATION CONSULT NOTE  Pharmacy Consult for COUMADIN (home med) Indication: atrial fibrillation  No Known Allergies  Patient Measurements: Height: 5\' 4"  (162.6 cm) Weight: 211 lb 10.3 oz (96 kg) IBW/kg (Calculated) : 54.7  Vital Signs: Temp: 98.7 F (37.1 C) (07/21 0505) Temp Source: Oral (07/21 0505) BP: 116/66 (07/21 0505) Pulse Rate: 81 (07/21 0505)  Labs:  Recent Labs  08/25/16 0930 08/25/16 1033 08/26/16 0450 08/27/16 0623  HGB 9.1*  --  9.2*  --   HCT 27.8*  --  28.2*  --   PLT 307  --  293  --   LABPROT  --  18.1* 19.5* 21.0*  INR  --  1.48 1.63 1.79  CREATININE  --   --  1.58* 1.63*   Estimated Creatinine Clearance: 29.4 mL/min (A) (by C-G formula based on SCr of 1.63 mg/dL (H)).  Medical History: Past Medical History:  Diagnosis Date  . Atrial fibrillation (HCC)   . CAD (coronary artery disease)    Multivessel status post high risk PCI/DES to circumflex 11/2015 (poor candidate for CABG)  . CHF (congestive heart failure) (HCC)   . CKD (chronic kidney disease) stage 3, GFR 30-59 ml/min   . DDD (degenerative disc disease), lumbar   . Essential hypertension, benign   . Mixed hyperlipidemia   . Type 2 diabetes mellitus (HCC)    Medications:  Prescriptions Prior to Admission  Medication Sig Dispense Refill Last Dose  . acetaminophen (TYLENOL) 325 MG tablet Take 2 tablets (650 mg total) by mouth every 4 (four) hours as needed for mild pain, moderate pain, fever or headache.   unknown  . ALPRAZolam (XANAX) 0.25 MG tablet Take 0.25 mg by mouth 3 (three) times daily as needed.   unknown  . amLODipine (NORVASC) 2.5 MG tablet TAKE (1) TABLET BY MOUTH DAILY. 30 tablet 6 08/18/2016 at Unknown time  . atorvastatin (LIPITOR) 40 MG tablet Take 1 tablet (40 mg total) by mouth daily. 90 tablet 3 08/18/2016 at Unknown time  . clopidogrel (PLAVIX) 75 MG tablet Take 1 tablet (75 mg total) by mouth daily. 90 tablet 3 08/18/2016 at Unknown time  . glimepiride (AMARYL) 2  MG tablet Take 2 mg by mouth daily.     08/18/2016 at Unknown time  . hydrALAZINE (APRESOLINE) 100 MG tablet Take 1 tablet (100 mg total) by mouth 3 (three) times daily. 270 tablet 3 08/18/2016 at Unknown time  . HYDROcodone-acetaminophen (NORCO/VICODIN) 5-325 MG tablet Take 1 tablet by mouth 4 (four) times daily.    08/18/2016 at Unknown time  . insulin detemir (LEVEMIR) 100 UNIT/ML injection Inject 0.2 mLs (20 Units total) into the skin at bedtime. 10 mL 11 08/17/2016 at Unknown time  . insulin lispro (HUMALOG) 100 UNIT/ML injection Inject 0-0.15 mLs (0-15 Units total) into the skin 3 (three) times daily with meals. Use as sliding scale 10 mL 11 08/18/2016 at Unknown time  . isosorbide mononitrate (IMDUR) 60 MG 24 hr tablet Take 1 tablet (60 mg total) by mouth daily. 90 tablet 3 08/18/2016 at Unknown time  . nitroGLYCERIN (NITROSTAT) 0.4 MG SL tablet Place 1 tablet (0.4 mg total) under the tongue every 5 (five) minutes as needed for chest pain. 25 tablet 2 unknown  . pantoprazole (PROTONIX) 40 MG tablet Take 1 tablet (40 mg total) by mouth daily at 6 (six) AM. 90 tablet 3 08/18/2016 at Unknown time  . PROAIR HFA 108 (90 Base) MCG/ACT inhaler Inhale 1-2 puffs into the lungs 4 (four) times daily.  08/18/2016 at Unknown time  . torsemide (DEMADEX) 20 MG tablet Take 2 tablets (40 mg total) by mouth 2 (two) times daily. 120 tablet 6 08/18/2016 at Unknown time  . TRAVATAN Z 0.004 % SOLN ophthalmic solution Place 1 drop into both eyes at bedtime.    08/17/2016 at Unknown time  . umeclidinium bromide (INCRUSE ELLIPTA) 62.5 MCG/INH AEPB Inhale 1 puff into the lungs daily.   08/18/2016 at Unknown time  . warfarin (COUMADIN) 5 MG tablet TAKE 1 TABLET BY MOUTH DAILY EXCEPT 1/2 TABLET ON WEDNESDAYS AND SATURDAYS. 30 tablet 6 08/17/2016 at 2130   Assessment: 83yo female on chronic Coumadin PTA for h/o afib.  Warfarin previously held due to elevated INR and anemia.  OK to restart per cardiology on 7/17.  INR below goal  again today but trending up.  Patient on day 2/3 bactrim  Goal of Therapy:  INR 2-3 Monitor platelets by anticoagulation protocol: Yes   Plan:  Coumadin 2mg  today x 1  INR daily Monitor for signs and symptoms of bleeding.   Kacyn Souder P[place]et 08/27/2016,8:18 AM

## 2016-08-27 NOTE — Progress Notes (Addendum)
CRITICAL VALUE ALERT  Critical Value:  CBG 24  Date & Time Notied:  08/27/2016 @ 0725  Provider Notified: Dr. [name]   Orders Received/Actions taken: 1 ampule 25 grams of Dextrose 50% given. Will recheck CBG.   CBG rechecked @ 08:15- it was 88.

## 2016-08-27 NOTE — Progress Notes (Signed)
Subjective: She says she feels okay. She had another episode of hypoglycemia this morning so I have discontinued her basal insulin for now. She has no other new complaints. She says her breathing is okay. Her leg pain is better.  Objective: Vital signs in last 24 hours: Temp:  [98.6 F (37 C)-101.9 F (38.8 C)] 98.7 F (37.1 C) (07/21 0505) Pulse Rate:  [81-91] 81 (07/21 0505) Resp:  [16-18] 18 (07/21 0505) BP: (116-167)/(60-69) 116/66 (07/21 0505) SpO2:  [96 %-99 %] 97 % (07/21 0750) Weight:  [96 kg (211 lb 10.3 oz)] 96 kg (211 lb 10.3 oz) (07/21 0505) Weight change: -0.026 kg (-0.9 oz) Last BM Date: 08/24/16  Intake/Output from previous day: 07/20 0701 - 07/21 0700 In: -  Out: 1700 [Urine:1700]  PHYSICAL EXAM General appearance: alert, cooperative and mild distress Resp: rales bibasilar Cardio: irregularly irregular rhythm GI: soft, non-tender; bowel sounds normal; no masses,  no organomegaly Extremities: Edema is gradually improving but she still has edema that is dependent in her arms Skin warm and dry. Very hard of hearing  Lab Results:  Results for orders placed or performed during the hospital encounter of 08/18/16 (from the past 48 hour(s))  Glucose, capillary     Status: Abnormal   Collection Time: 08/25/16 11:53 AM  Result Value Ref Range   Glucose-Capillary 125 (H) 65 - 99 mg/dL  Glucose, capillary     Status: Abnormal   Collection Time: 08/25/16  4:06 PM  Result Value Ref Range   Glucose-Capillary 189 (H) 65 - 99 mg/dL   Comment 1 Notify RN    Comment 2 Document in Chart   Glucose, capillary     Status: Abnormal   Collection Time: 08/25/16  8:27 PM  Result Value Ref Range   Glucose-Capillary 135 (H) 65 - 99 mg/dL   Comment 1 Notify RN    Comment 2 Document in Chart   Protime-INR     Status: Abnormal   Collection Time: 08/26/16  4:50 AM  Result Value Ref Range   Prothrombin Time 19.5 (H) 11.4 - 15.2 seconds   INR 1.63   CBC     Status: Abnormal   Collection Time: 08/26/16  4:50 AM  Result Value Ref Range   WBC 13.3 (H) 4.0 - 10.5 K/uL   RBC 2.98 (L) 3.87 - 5.11 MIL/uL   Hemoglobin 9.2 (L) 12.0 - 15.0 g/dL   HCT 28.2 (L) 36.0 - 46.0 %   MCV 94.6 78.0 - 100.0 fL   MCH 30.9 26.0 - 34.0 pg   MCHC 32.6 30.0 - 36.0 g/dL   RDW 15.4 11.5 - 15.5 %   Platelets 293 150 - 400 K/uL  Basic metabolic panel     Status: Abnormal   Collection Time: 08/26/16  4:50 AM  Result Value Ref Range   Sodium 140 135 - 145 mmol/L   Potassium 3.5 3.5 - 5.1 mmol/L   Chloride 100 (L) 101 - 111 mmol/L   CO2 30 22 - 32 mmol/L   Glucose, Bld 30 (LL) 65 - 99 mg/dL    Comment: CRITICAL RESULT CALLED TO, READ BACK BY AND VERIFIED WITH: AMBURN,A AT 7:10AM ON 08/26/16 BY FESTERMAN,C    BUN 78 (H) 6 - 20 mg/dL   Creatinine, Ser 1.58 (H) 0.44 - 1.00 mg/dL   Calcium 7.9 (L) 8.9 - 10.3 mg/dL   GFR calc non Af Amer 29 (L) >60 mL/min   GFR calc Af Amer 34 (L) >60 mL/min  Comment: (NOTE) The eGFR has been calculated using the CKD EPI equation. This calculation has not been validated in all clinical situations. eGFR's persistently <60 mL/min signify possible Chronic Kidney Disease.    Anion gap 10 5 - 15  Glucose, capillary     Status: Abnormal   Collection Time: 08/26/16  7:19 AM  Result Value Ref Range   Glucose-Capillary 29 (LL) 65 - 99 mg/dL   Comment 1 Notify RN   Glucose, capillary     Status: None   Collection Time: 08/26/16  7:42 AM  Result Value Ref Range   Glucose-Capillary 98 65 - 99 mg/dL  Urinalysis, Complete w Microscopic     Status: Abnormal   Collection Time: 08/26/16 10:43 AM  Result Value Ref Range   Color, Urine YELLOW YELLOW   APPearance TURBID (A) CLEAR   Specific Gravity, Urine 1.010 1.005 - 1.030   pH 6.0 5.0 - 8.0   Glucose, UA NEGATIVE NEGATIVE mg/dL   Hgb urine dipstick LARGE (A) NEGATIVE   Bilirubin Urine NEGATIVE NEGATIVE   Ketones, ur NEGATIVE NEGATIVE mg/dL   Protein, ur 811 (A) NEGATIVE mg/dL   Nitrite NEGATIVE  NEGATIVE   Leukocytes, UA MODERATE (A) NEGATIVE   RBC / HPF TOO NUMEROUS TO COUNT 0 - 5 RBC/hpf   WBC, UA TOO NUMEROUS TO COUNT 0 - 5 WBC/hpf   Bacteria, UA MANY (A) NONE SEEN   Squamous Epithelial / LPF NONE SEEN NONE SEEN   Mucous PRESENT    Crystals CA OXALATE CRYSTALS (A) NEGATIVE    Comment: CALLED CORRECTED REPORT TO BIVENS L. AT 1027 ON [id number] THOMPSON S. CORRECTED ON 07/21 AT 1043: PREVIOUSLY REPORTED AS PRESENT   Glucose, capillary     Status: Abnormal   Collection Time: 08/26/16 11:19 AM  Result Value Ref Range   Glucose-Capillary 124 (H) 65 - 99 mg/dL  Glucose, capillary     Status: Abnormal   Collection Time: 08/26/16  4:05 PM  Result Value Ref Range   Glucose-Capillary 134 (H) 65 - 99 mg/dL  Glucose, capillary     Status: Abnormal   Collection Time: 08/26/16  9:11 PM  Result Value Ref Range   Glucose-Capillary 116 (H) 65 - 99 mg/dL   Comment 1 Notify RN    Comment 2 Document in Chart   Protime-INR     Status: Abnormal   Collection Time: 08/27/16  6:23 AM  Result Value Ref Range   Prothrombin Time 21.0 (H) 11.4 - 15.2 seconds   INR 1.79   Basic metabolic panel     Status: Abnormal   Collection Time: 08/27/16  6:23 AM  Result Value Ref Range   Sodium 142 135 - 145 mmol/L   Potassium 3.3 (L) 3.5 - 5.1 mmol/L   Chloride 102 101 - 111 mmol/L   CO2 31 22 - 32 mmol/L   Glucose, Bld 34 (LL) 65 - 99 mg/dL    Comment: CRITICAL RESULT CALLED TO, READ BACK BY AND VERIFIED WITH: BIVENS,L AT 1599 ON 08/27/2016 BY MOSLEY,J    BUN 69 (H) 6 - 20 mg/dL   Creatinine, Ser 1.48 (H) 0.44 - 1.00 mg/dL   Calcium 7.9 (L) 8.9 - 10.3 mg/dL   GFR calc non Af Amer 28 (L) >60 mL/min   GFR calc Af Amer 33 (L) >60 mL/min    Comment: (NOTE) The eGFR has been calculated using the CKD EPI equation. This calculation has not been validated in all clinical situations. eGFR's persistently <60 mL/min signify  possible Chronic Kidney Disease.    Anion gap 9 5 - 15  Glucose, capillary      Status: Abnormal   Collection Time: 08/27/16  7:28 AM  Result Value Ref Range   Glucose-Capillary 24 (LL) 65 - 99 mg/dL   Comment 1 Notify RN   Glucose, capillary     Status: None   Collection Time: 08/27/16  8:19 AM  Result Value Ref Range   Glucose-Capillary 88 65 - 99 mg/dL    ABGS No results for input(s): PHART, PO2ART, TCO2, HCO3 in the last 72 hours.  Invalid input(s): PCO2 CULTURES Recent Results (from the past 240 hour(s))  MRSA PCR Screening     Status: None   Collection Time: 08/19/16  3:44 PM  Result Value Ref Range Status   MRSA by PCR NEGATIVE NEGATIVE Final    Comment:        The GeneXpert MRSA Assay (FDA approved for NASAL specimens only), is one component of a comprehensive MRSA colonization surveillance program. It is not intended to diagnose MRSA infection nor to guide or monitor treatment for MRSA infections.    Studies/Results: US Venous Img Lower Unilateral Right  Result Date: 08/26/2016 CLINICAL DATA:  83 year old female with history of right lower extremity pain. Chronic venous insufficiency. EXAM: RIGHT LOWER EXTREMITY VENOUS DOPPLER ULTRASOUND TECHNIQUE: Gray-scale sonography with graded compression, as well as color Doppler and duplex ultrasound were performed to evaluate the lower extremity deep venous systems from the level of the common femoral vein and including the common femoral, femoral, profunda femoral, popliteal and calf veins including the posterior tibial, peroneal and gastrocnemius veins when visible. The superficial great saphenous vein was also interrogated. Spectral Doppler was utilized to evaluate flow at rest and with distal augmentation maneuvers in the common femoral, femoral and popliteal veins. COMPARISON:  None. FINDINGS: Contralateral Common Femoral Vein: Respiratory phasicity is normal and symmetric with the symptomatic side. No evidence of thrombus. Normal compressibility. Common Femoral Vein: No evidence of thrombus. Normal  compressibility, respiratory phasicity and response to augmentation. Saphenofemoral Junction: No evidence of thrombus. Normal compressibility and flow on color Doppler imaging. Profunda Femoral Vein: No evidence of thrombus. Normal compressibility and flow on color Doppler imaging. Femoral Vein: No evidence of thrombus. Normal compressibility, respiratory phasicity and response to augmentation. Popliteal Vein: No evidence of thrombus. Normal compressibility, respiratory phasicity and response to augmentation. Calf Veins: No evidence of thrombus. Normal compressibility and flow on color Doppler imaging. Superficial Great Saphenous Vein: No evidence of thrombus. Normal compressibility and flow on color Doppler imaging. Venous Reflux:  None. Other Findings: In the left groin there is a 4.9 x 2.8 x 5.3 cm lesion that is predominantly anechoic with increased through transmission, however, there appears to be some complex internal echoes with an appearance suggestive of a pedunculated soft tissue component. On some images, there is a suggestion of internal flow, however, after discussion with sonographer, this is favored to be artifactual. IMPRESSION: 1. No evidence of DVT within the right lower extremity. 2. Complex cystic lesion in the left groin, as discussed above. This is of uncertain etiology and significance, but is favored to represent either a a complex seroma, old chronic complex hematoma, or left inguinal or femoral hernia containing fluid. Electronically Signed   By: Vinnie Langton M.D.   On: 08/26/2016 08:53    Medications:  Prior to Admission:  Prescriptions Prior to Admission  Medication Sig Dispense Refill Last Dose  . acetaminophen (TYLENOL) 325 MG tablet Take 2  tablets (650 mg total) by mouth every 4 (four) hours as needed for mild pain, moderate pain, fever or headache.   unknown  . ALPRAZolam (XANAX) 0.25 MG tablet Take 0.25 mg by mouth 3 (three) times daily as needed.   unknown  .  amLODipine (NORVASC) 2.5 MG tablet TAKE (1) TABLET BY MOUTH DAILY. 30 tablet 6 08/18/2016 at Unknown time  . atorvastatin (LIPITOR) 40 MG tablet Take 1 tablet (40 mg total) by mouth daily. 90 tablet 3 08/18/2016 at Unknown time  . clopidogrel (PLAVIX) 75 MG tablet Take 1 tablet (75 mg total) by mouth daily. 90 tablet 3 08/18/2016 at Unknown time  . glimepiride (AMARYL) 2 MG tablet Take 2 mg by mouth daily.     08/18/2016 at Unknown time  . hydrALAZINE (APRESOLINE) 100 MG tablet Take 1 tablet (100 mg total) by mouth 3 (three) times daily. 270 tablet 3 08/18/2016 at Unknown time  . HYDROcodone-acetaminophen (NORCO/VICODIN) 5-325 MG tablet Take 1 tablet by mouth 4 (four) times daily.    08/18/2016 at Unknown time  . insulin detemir (LEVEMIR) 100 UNIT/ML injection Inject 0.2 mLs (20 Units total) into the skin at bedtime. 10 mL 11 08/17/2016 at Unknown time  . insulin lispro (HUMALOG) 100 UNIT/ML injection Inject 0-0.15 mLs (0-15 Units total) into the skin 3 (three) times daily with meals. Use as sliding scale 10 mL 11 08/18/2016 at Unknown time  . isosorbide mononitrate (IMDUR) 60 MG 24 hr tablet Take 1 tablet (60 mg total) by mouth daily. 90 tablet 3 08/18/2016 at Unknown time  . nitroGLYCERIN (NITROSTAT) 0.4 MG SL tablet Place 1 tablet (0.4 mg total) under the tongue every 5 (five) minutes as needed for chest pain. 25 tablet 2 unknown  . pantoprazole (PROTONIX) 40 MG tablet Take 1 tablet (40 mg total) by mouth daily at 6 (six) AM. 90 tablet 3 08/18/2016 at Unknown time  . PROAIR HFA 108 (90 Base) MCG/ACT inhaler Inhale 1-2 puffs into the lungs 4 (four) times daily.    08/18/2016 at Unknown time  . torsemide (DEMADEX) 20 MG tablet Take 2 tablets (40 mg total) by mouth 2 (two) times daily. 120 tablet 6 08/18/2016 at Unknown time  . TRAVATAN Z 0.004 % SOLN ophthalmic solution Place 1 drop into both eyes at bedtime.    08/17/2016 at Unknown time  . umeclidinium bromide (INCRUSE ELLIPTA) 62.5 MCG/INH AEPB Inhale 1 puff  into the lungs daily.   08/18/2016 at Unknown time  . warfarin (COUMADIN) 5 MG tablet TAKE 1 TABLET BY MOUTH DAILY EXCEPT 1/2 TABLET ON WEDNESDAYS AND SATURDAYS. 30 tablet 6 08/17/2016 at 2130   Scheduled: . amLODipine  5 mg Oral Daily  . atorvastatin  40 mg Oral q1800  . clopidogrel  75 mg Oral Daily  . furosemide  80 mg Intravenous BID  . hydrALAZINE  100 mg Oral TID  . insulin aspart  0-15 Units Subcutaneous TID WC  . insulin aspart  0-5 Units Subcutaneous QHS  . insulin aspart  4 Units Subcutaneous TID WC  . ipratropium-albuterol  3 mL Nebulization BID  . isosorbide mononitrate  90 mg Oral Daily  . latanoprost  1 drop Both Eyes QHS  . pantoprazole  40 mg Oral Q0600  . sodium chloride flush  3 mL Intravenous Q12H  . sulfamethoxazole-trimethoprim  0.5 tablet Oral Q12H  . warfarin  2 mg Oral Once  . Warfarin - Pharmacist Dosing Inpatient   Does not apply Q24H   Continuous: . sodium chloride  GOV:PCHEKB chloride, acetaminophen, albuterol, ALPRAZolam, bisacodyl, bisacodyl, magnesium hydroxide, metoprolol tartrate, morphine injection, ondansetron (ZOFRAN) IV, sodium chloride flush  Assesment: She was admitted with acute on chronic diastolic heart failure. She has persistent atrial fib. She has chronic kidney disease stage IV. She has coronary disease but is not a very good candidate for any invasive procedures. She has diabetes and her blood sugar has been low Principal Problem:   Acute on chronic diastolic CHF (congestive heart failure) (HCC) Active Problems:   Essential hypertension, benign   Persistent atrial fibrillation (HCC)   Long term current use of anticoagulant   Chronic kidney disease (CKD), stage IV (severe) (HCC)   Status post coronary artery stent placement   Diabetes mellitus type 2, controlled ([place])   Acute on chronic diastolic (congestive) heart failure ([place])    Plan: Discontinue Lantus. Watch her blood sugars carefully. I'm concerned about her going home I  don't know she's going to be able to manage at home and I will request another evaluation by physical therapy to see if she is a candidate for rehabilitation    LOS: 8 days   Camia Dipinto L 08/27/2016, 10:57 AM

## 2016-08-28 LAB — GLUCOSE, CAPILLARY
GLUCOSE-CAPILLARY: 139 mg/dL — AB (ref 65–99)
GLUCOSE-CAPILLARY: 164 mg/dL — AB (ref 65–99)
Glucose-Capillary: 140 mg/dL — ABNORMAL HIGH (ref 65–99)
Glucose-Capillary: 165 mg/dL — ABNORMAL HIGH (ref 65–99)

## 2016-08-28 LAB — BASIC METABOLIC PANEL
ANION GAP: 10 (ref 5–15)
BUN: 70 mg/dL — AB (ref 6–20)
CO2: 29 mmol/L (ref 22–32)
Calcium: 8.1 mg/dL — ABNORMAL LOW (ref 8.9–10.3)
Chloride: 101 mmol/L (ref 101–111)
Creatinine, Ser: 2.04 mg/dL — ABNORMAL HIGH (ref 0.44–1.00)
GFR, EST AFRICAN AMERICAN: 25 mL/min — AB (ref >60)
GFR, EST NON AFRICAN AMERICAN: 21 mL/min — AB (ref >60)
Glucose, Bld: 156 mg/dL — ABNORMAL HIGH (ref 65–99)
POTASSIUM: 4.2 mmol/L (ref 3.5–5.1)
SODIUM: 140 mmol/L (ref 135–145)

## 2016-08-28 LAB — URINE CULTURE

## 2016-08-28 LAB — PROTIME-INR
INR: 1.72
Prothrombin Time: 20.4 seconds — ABNORMAL HIGH (ref 11.4–15.2)

## 2016-08-28 MED ORDER — WARFARIN SODIUM 2.5 MG PO TABS
2.5000 mg | ORAL_TABLET | Freq: Once | ORAL | Status: AC
  Administered 2016-08-28: 2.5 mg via ORAL
  Filled 2016-08-28: qty 1

## 2016-08-28 MED ORDER — PRIMIDONE 50 MG PO TABS
50.0000 mg | ORAL_TABLET | Freq: Every day | ORAL | Status: DC
Start: 2016-08-28 — End: 2016-09-02
  Administered 2016-08-28 – 2016-09-02 (×6): 50 mg via ORAL
  Filled 2016-08-28 (×6): qty 1

## 2016-08-28 NOTE — Progress Notes (Addendum)
Subjective: She continues to be weak. She complains that she's not had a bowel movement. She's complaining that her arm is shaking. No other new complaints now.  Objective: Vital signs in last 24 hours: Temp:  [98.5 F (36.9 C)-99.8 F (37.7 C)] 98.8 F (37.1 C) (07/22 0516) Pulse Rate:  [60-86] 60 (07/22 0516) Resp:  [16-18] 18 (07/22 0516) BP: (120-134)/(56-71) 120/71 (07/22 0516) SpO2:  [92 %-100 %] 96 % (07/22 0756) Weight:  [97.3 kg (214 lb 8.1 oz)] 97.3 kg (214 lb 8.1 oz) (07/22 0516) Weight change: 1.3 kg (2 lb 13.9 oz) Last BM Date: 08/24/16  Intake/Output from previous day: 07/21 0701 - 07/22 0700 In: 480 [P.O.:480] Out: 1800 [Urine:1800]  PHYSICAL EXAM General appearance: alert, cooperative and mild distress Resp: clear to auscultation bilaterally Cardio: irregularly irregular rhythm GI: soft, non-tender; bowel sounds normal; no masses,  no organomegaly Extremities: Edema continues to improve but is not gone She does have a tremor of her left hand  Lab Results:  Results for orders placed or performed during the hospital encounter of 08/18/16 (from the past 48 hour(s))  Glucose, capillary     Status: Abnormal   Collection Time: 08/26/16 11:19 AM  Result Value Ref Range   Glucose-Capillary 124 (H) 65 - 99 mg/dL  Glucose, capillary     Status: Abnormal   Collection Time: 08/26/16  4:05 PM  Result Value Ref Range   Glucose-Capillary 134 (H) 65 - 99 mg/dL  Glucose, capillary     Status: Abnormal   Collection Time: 08/26/16  9:11 PM  Result Value Ref Range   Glucose-Capillary 116 (H) 65 - 99 mg/dL   Comment 1 Notify RN    Comment 2 Document in Chart   Protime-INR     Status: Abnormal   Collection Time: 08/27/16  6:23 AM  Result Value Ref Range   Prothrombin Time 21.0 (H) 11.4 - 15.2 seconds   INR 9.15   Basic metabolic panel     Status: Abnormal   Collection Time: 08/27/16  6:23 AM  Result Value Ref Range   Sodium 142 135 - 145 mmol/L   Potassium 3.3 (L)  3.5 - 5.1 mmol/L   Chloride 102 101 - 111 mmol/L   CO2 31 22 - 32 mmol/L   Glucose, Bld 34 (LL) 65 - 99 mg/dL    Comment: CRITICAL RESULT CALLED TO, READ BACK BY AND VERIFIED WITH: BIVENS,L AT 0569 ON 08/27/2016 BY MOSLEY,J    BUN 69 (H) 6 - 20 mg/dL   Creatinine, Ser 1.63 (H) 0.44 - 1.00 mg/dL   Calcium 7.9 (L) 8.9 - 10.3 mg/dL   GFR calc non Af Amer 28 (L) >60 mL/min   GFR calc Af Amer 33 (L) >60 mL/min    Comment: (NOTE) The eGFR has been calculated using the CKD EPI equation. This calculation has not been validated in all clinical situations. eGFR's persistently <60 mL/min signify possible Chronic Kidney Disease.    Anion gap 9 5 - 15  Glucose, capillary     Status: Abnormal   Collection Time: 08/27/16  7:28 AM  Result Value Ref Range   Glucose-Capillary 24 (LL) 65 - 99 mg/dL   Comment 1 Notify RN   Glucose, capillary     Status: None   Collection Time: 08/27/16  8:19 AM  Result Value Ref Range   Glucose-Capillary 88 65 - 99 mg/dL  Glucose, capillary     Status: Abnormal   Collection Time: 08/27/16 11:19 AM  Result  Value Ref Range   Glucose-Capillary 123 (H) 65 - 99 mg/dL  Glucose, capillary     Status: Abnormal   Collection Time: 08/27/16  4:22 PM  Result Value Ref Range   Glucose-Capillary 146 (H) 65 - 99 mg/dL  Glucose, capillary     Status: Abnormal   Collection Time: 08/27/16  8:40 PM  Result Value Ref Range   Glucose-Capillary 146 (H) 65 - 99 mg/dL   Comment 1 Notify RN    Comment 2 Document in Chart   Protime-INR     Status: Abnormal   Collection Time: 08/28/16  6:22 AM  Result Value Ref Range   Prothrombin Time 20.4 (H) 11.4 - 15.2 seconds   INR 2.26   Basic metabolic panel     Status: Abnormal   Collection Time: 08/28/16  6:22 AM  Result Value Ref Range   Sodium 140 135 - 145 mmol/L   Potassium 4.2 3.5 - 5.1 mmol/L    Comment: DELTA CHECK NOTED NO VISIBLE HEMOLYSIS    Chloride 101 101 - 111 mmol/L   CO2 29 22 - 32 mmol/L   Glucose, Bld 156 (H) 65 -  99 mg/dL   BUN 70 (H) 6 - 20 mg/dL   Creatinine, Ser 2.04 (H) 0.44 - 1.00 mg/dL   Calcium 8.1 (L) 8.9 - 10.3 mg/dL   GFR calc non Af Amer 21 (L) >60 mL/min   GFR calc Af Amer 25 (L) >60 mL/min    Comment: (NOTE) The eGFR has been calculated using the CKD EPI equation. This calculation has not been validated in all clinical situations. eGFR's persistently <60 mL/min signify possible Chronic Kidney Disease.    Anion gap 10 5 - 15  Glucose, capillary     Status: Abnormal   Collection Time: 08/28/16  7:24 AM  Result Value Ref Range   Glucose-Capillary 139 (H) 65 - 99 mg/dL    ABGS No results for input(s): PHART, PO2ART, TCO2, HCO3 in the last 72 hours.  Invalid input(s): PCO2 CULTURES Recent Results (from the past 240 hour(s))  MRSA PCR Screening     Status: None   Collection Time: 08/19/16  3:44 PM  Result Value Ref Range Status   MRSA by PCR NEGATIVE NEGATIVE Final    Comment:        The GeneXpert MRSA Assay (FDA approved for NASAL specimens only), is one component of a comprehensive MRSA colonization surveillance program. It is not intended to diagnose MRSA infection nor to guide or monitor treatment for MRSA infections.   Urine Culture     Status: Abnormal (Preliminary result)   Collection Time: 08/26/16 10:43 AM  Result Value Ref Range Status   Specimen Description URINE, CATHETERIZED  Final   Special Requests NONE  Final   Culture (A)  Final    >=100,000 COLONIES/mL KLEBSIELLA PNEUMONIAE CULTURE REINCUBATED FOR BETTER GROWTH Performed at [facility], [address], [place], [place] [address]    Report Status PENDING  Incomplete   Organism ID, Bacteria KLEBSIELLA PNEUMONIAE (A)  Final      Susceptibility   Klebsiella pneumoniae - MIC*    AMPICILLIN RESISTANT Resistant     CEFAZOLIN <=4 SENSITIVE Sensitive     CEFTRIAXONE <=1 SENSITIVE Sensitive     CIPROFLOXACIN <=0.25 SENSITIVE Sensitive     GENTAMICIN <=1 SENSITIVE Sensitive     IMIPENEM <=0.25  SENSITIVE Sensitive     NITROFURANTOIN 32 SENSITIVE Sensitive     TRIMETH/SULFA <=20 SENSITIVE Sensitive  AMPICILLIN/SULBACTAM 4 SENSITIVE Sensitive     PIP/TAZO <=4 SENSITIVE Sensitive     Extended ESBL NEGATIVE Sensitive     * >=100,000 COLONIES/mL KLEBSIELLA PNEUMONIAE   Studies/Results: No results found.  Medications:  Prior to Admission:  Prescriptions Prior to Admission  Medication Sig Dispense Refill Last Dose  . acetaminophen (TYLENOL) 325 MG tablet Take 2 tablets (650 mg total) by mouth every 4 (four) hours as needed for mild pain, moderate pain, fever or headache.   unknown  . ALPRAZolam (XANAX) 0.25 MG tablet Take 0.25 mg by mouth 3 (three) times daily as needed.   unknown  . amLODipine (NORVASC) 2.5 MG tablet TAKE (1) TABLET BY MOUTH DAILY. 30 tablet 6 08/18/2016 at Unknown time  . atorvastatin (LIPITOR) 40 MG tablet Take 1 tablet (40 mg total) by mouth daily. 90 tablet 3 08/18/2016 at Unknown time  . clopidogrel (PLAVIX) 75 MG tablet Take 1 tablet (75 mg total) by mouth daily. 90 tablet 3 08/18/2016 at Unknown time  . glimepiride (AMARYL) 2 MG tablet Take 2 mg by mouth daily.     08/18/2016 at Unknown time  . hydrALAZINE (APRESOLINE) 100 MG tablet Take 1 tablet (100 mg total) by mouth 3 (three) times daily. 270 tablet 3 08/18/2016 at Unknown time  . HYDROcodone-acetaminophen (NORCO/VICODIN) 5-325 MG tablet Take 1 tablet by mouth 4 (four) times daily.    08/18/2016 at Unknown time  . insulin detemir (LEVEMIR) 100 UNIT/ML injection Inject 0.2 mLs (20 Units total) into the skin at bedtime. 10 mL 11 08/17/2016 at Unknown time  . insulin lispro (HUMALOG) 100 UNIT/ML injection Inject 0-0.15 mLs (0-15 Units total) into the skin 3 (three) times daily with meals. Use as sliding scale 10 mL 11 08/18/2016 at Unknown time  . isosorbide mononitrate (IMDUR) 60 MG 24 hr tablet Take 1 tablet (60 mg total) by mouth daily. 90 tablet 3 08/18/2016 at Unknown time  . nitroGLYCERIN (NITROSTAT) 0.4 MG SL  tablet Place 1 tablet (0.4 mg total) under the tongue every 5 (five) minutes as needed for chest pain. 25 tablet 2 unknown  . pantoprazole (PROTONIX) 40 MG tablet Take 1 tablet (40 mg total) by mouth daily at 6 (six) AM. 90 tablet 3 08/18/2016 at Unknown time  . PROAIR HFA 108 (90 Base) MCG/ACT inhaler Inhale 1-2 puffs into the lungs 4 (four) times daily.    08/18/2016 at Unknown time  . torsemide (DEMADEX) 20 MG tablet Take 2 tablets (40 mg total) by mouth 2 (two) times daily. 120 tablet 6 08/18/2016 at Unknown time  . TRAVATAN Z 0.004 % SOLN ophthalmic solution Place 1 drop into both eyes at bedtime.    08/17/2016 at Unknown time  . umeclidinium bromide (INCRUSE ELLIPTA) 62.5 MCG/INH AEPB Inhale 1 puff into the lungs daily.   08/18/2016 at Unknown time  . warfarin (COUMADIN) 5 MG tablet TAKE 1 TABLET BY MOUTH DAILY EXCEPT 1/2 TABLET ON WEDNESDAYS AND SATURDAYS. 30 tablet 6 08/17/2016 at 2130   Scheduled: . amLODipine  5 mg Oral Daily  . atorvastatin  40 mg Oral q1800  . clopidogrel  75 mg Oral Daily  . furosemide  80 mg Intravenous BID  . hydrALAZINE  100 mg Oral TID  . insulin aspart  0-15 Units Subcutaneous TID WC  . insulin aspart  0-5 Units Subcutaneous QHS  . insulin aspart  4 Units Subcutaneous TID WC  . ipratropium-albuterol  3 mL Nebulization BID  . isosorbide mononitrate  90 mg Oral Daily  .  latanoprost  1 drop Both Eyes QHS  . pantoprazole  40 mg Oral Q0600  . sodium chloride flush  3 mL Intravenous Q12H  . sulfamethoxazole-trimethoprim  0.5 tablet Oral Q12H  . warfarin  2.5 mg Oral Once  . Warfarin - Pharmacist Dosing Inpatient   Does not apply Q24H   Continuous: . sodium chloride     JJK:KXFGHW chloride, acetaminophen, albuterol, ALPRAZolam, bisacodyl, bisacodyl, magnesium hydroxide, metoprolol tartrate, morphine injection, ondansetron (ZOFRAN) IV, sodium chloride flush  Assesment: She was admitted with acute on chronic diastolic heart failure. She has hypertension and that's  pre-well controlled. She has persistent atrial fib on anticoagulation. She is known to have coronary artery occlusive disease. She has chronic kidney disease stage IV which complicates her situation. She is still very Principal Problem:   Acute on chronic diastolic CHF (congestive heart failure) (HCC) Active Problems:   Essential hypertension, benign   Persistent atrial fibrillation ([place])   Long term current use of anticoagulant   Chronic kidney disease (CKD), stage IV (severe) (HCC)   Status post coronary artery stent placement   Diabetes mellitus type 2, controlled ([place])   Acute on chronic diastolic (congestive) heart failure ([place]) I will request repeat physical therapy evaluation. Continue with other treatments. Treat her constipation.  Her renal function looks a little worse so I'm going to stop IV Lasix for now   LOS: 9 days   Sheri Gatchel L 08/28/2016, 11:13 AM

## 2016-08-28 NOTE — Progress Notes (Signed)
ANTICOAGULATION CONSULT NOTE  Pharmacy Consult for COUMADIN (home med) Indication: atrial fibrillation  No Known Allergies  Patient Measurements: Height: 5\' 4"  (162.6 cm) Weight: 214 lb 8.1 oz (97.3 kg) IBW/kg (Calculated) : 54.7  Vital Signs: Temp: 98.8 F (37.1 C) (07/22 0516) Temp Source: Oral (07/22 0516) BP: 120/71 (07/22 0516) Pulse Rate: 60 (07/22 0516)  Labs:  Recent Labs  08/26/16 0450 08/27/16 0623 08/28/16 0622  HGB 9.2*  --   --   HCT 28.2*  --   --   PLT 293  --   --   LABPROT 19.5* 21.0* 20.4*  INR 1.63 1.79 1.72  CREATININE 1.58* 1.63* 2.04*   Estimated Creatinine Clearance: 23.7 mL/min (A) (by C-G formula based on SCr of 2.04 mg/dL (H)).  Medical History: Past Medical History:  Diagnosis Date  . Atrial fibrillation (HCC)   . CAD (coronary artery disease)    Multivessel status post high risk PCI/DES to circumflex 11/2015 (poor candidate for CABG)  . CHF (congestive heart failure) (HCC)   . CKD (chronic kidney disease) stage 3, GFR 30-59 ml/min   . DDD (degenerative disc disease), lumbar   . Essential hypertension, benign   . Mixed hyperlipidemia   . Type 2 diabetes mellitus (HCC)    Medications:  Prescriptions Prior to Admission  Medication Sig Dispense Refill Last Dose  . acetaminophen (TYLENOL) 325 MG tablet Take 2 tablets (650 mg total) by mouth every 4 (four) hours as needed for mild pain, moderate pain, fever or headache.   unknown  . ALPRAZolam (XANAX) 0.25 MG tablet Take 0.25 mg by mouth 3 (three) times daily as needed.   unknown  . amLODipine (NORVASC) 2.5 MG tablet TAKE (1) TABLET BY MOUTH DAILY. 30 tablet 6 08/18/2016 at Unknown time  . atorvastatin (LIPITOR) 40 MG tablet Take 1 tablet (40 mg total) by mouth daily. 90 tablet 3 08/18/2016 at Unknown time  . clopidogrel (PLAVIX) 75 MG tablet Take 1 tablet (75 mg total) by mouth daily. 90 tablet 3 08/18/2016 at Unknown time  . glimepiride (AMARYL) 2 MG tablet Take 2 mg by mouth daily.      08/18/2016 at Unknown time  . hydrALAZINE (APRESOLINE) 100 MG tablet Take 1 tablet (100 mg total) by mouth 3 (three) times daily. 270 tablet 3 08/18/2016 at Unknown time  . HYDROcodone-acetaminophen (NORCO/VICODIN) 5-325 MG tablet Take 1 tablet by mouth 4 (four) times daily.    08/18/2016 at Unknown time  . insulin detemir (LEVEMIR) 100 UNIT/ML injection Inject 0.2 mLs (20 Units total) into the skin at bedtime. 10 mL 11 08/17/2016 at Unknown time  . insulin lispro (HUMALOG) 100 UNIT/ML injection Inject 0-0.15 mLs (0-15 Units total) into the skin 3 (three) times daily with meals. Use as sliding scale 10 mL 11 08/18/2016 at Unknown time  . isosorbide mononitrate (IMDUR) 60 MG 24 hr tablet Take 1 tablet (60 mg total) by mouth daily. 90 tablet 3 08/18/2016 at Unknown time  . nitroGLYCERIN (NITROSTAT) 0.4 MG SL tablet Place 1 tablet (0.4 mg total) under the tongue every 5 (five) minutes as needed for chest pain. 25 tablet 2 unknown  . pantoprazole (PROTONIX) 40 MG tablet Take 1 tablet (40 mg total) by mouth daily at 6 (six) AM. 90 tablet 3 08/18/2016 at Unknown time  . PROAIR HFA 108 (90 Base) MCG/ACT inhaler Inhale 1-2 puffs into the lungs 4 (four) times daily.    08/18/2016 at Unknown time  . torsemide (DEMADEX) 20 MG tablet Take 2 tablets (  40 mg total) by mouth 2 (two) times daily. 120 tablet 6 08/18/2016 at Unknown time  . TRAVATAN Z 0.004 % SOLN ophthalmic solution Place 1 drop into both eyes at bedtime.    08/17/2016 at Unknown time  . umeclidinium bromide (INCRUSE ELLIPTA) 62.5 MCG/INH AEPB Inhale 1 puff into the lungs daily.   08/18/2016 at Unknown time  . warfarin (COUMADIN) 5 MG tablet TAKE 1 TABLET BY MOUTH DAILY EXCEPT 1/2 TABLET ON WEDNESDAYS AND SATURDAYS. 30 tablet 6 08/17/2016 at 2130   Assessment: 83yo female on chronic Coumadin PTA for h/o afib.  Warfarin previously held due to elevated INR and anemia.  OK to restart per cardiology on 7/17.  INR below goal again today .  Patient on day 2/3  bactrim  Goal of Therapy:  INR 2-3 Monitor platelets by anticoagulation protocol: Yes   Plan:  Coumadin 2.5mg  today x 1  INR daily Monitor for signs and symptoms of bleeding.   Melissa [name] 08/28/2016,9:50 AM

## 2016-08-29 ENCOUNTER — Ambulatory Visit: Payer: Medicare Other | Admitting: Adult Health

## 2016-08-29 ENCOUNTER — Other Ambulatory Visit: Payer: Self-pay | Admitting: *Deleted

## 2016-08-29 LAB — CBC
HCT: 27.7 % — ABNORMAL LOW (ref 36.0–46.0)
Hemoglobin: 9.1 g/dL — ABNORMAL LOW (ref 12.0–15.0)
MCH: 31.1 pg (ref 26.0–34.0)
MCHC: 32.9 g/dL (ref 30.0–36.0)
MCV: 94.5 fL (ref 78.0–100.0)
Platelets: 250 K/uL (ref 150–400)
RBC: 2.93 MIL/uL — ABNORMAL LOW (ref 3.87–5.11)
RDW: 15.3 % (ref 11.5–15.5)
WBC: 10.6 K/uL — ABNORMAL HIGH (ref 4.0–10.5)

## 2016-08-29 LAB — BASIC METABOLIC PANEL
ANION GAP: 10 (ref 5–15)
BUN: 68 mg/dL — ABNORMAL HIGH (ref 6–20)
CALCIUM: 8.2 mg/dL — AB (ref 8.9–10.3)
CO2: 28 mmol/L (ref 22–32)
Chloride: 100 mmol/L — ABNORMAL LOW (ref 101–111)
Creatinine, Ser: 2.25 mg/dL — ABNORMAL HIGH (ref 0.44–1.00)
GFR, EST AFRICAN AMERICAN: 22 mL/min — AB (ref >60)
GFR, EST NON AFRICAN AMERICAN: 19 mL/min — AB (ref >60)
Glucose, Bld: 168 mg/dL — ABNORMAL HIGH (ref 65–99)
POTASSIUM: 4.2 mmol/L (ref 3.5–5.1)
SODIUM: 138 mmol/L (ref 135–145)

## 2016-08-29 LAB — GLUCOSE, CAPILLARY
Glucose-Capillary: 167 mg/dL — ABNORMAL HIGH (ref 65–99)
Glucose-Capillary: 195 mg/dL — ABNORMAL HIGH (ref 65–99)
Glucose-Capillary: 103 mg/dL — ABNORMAL HIGH (ref 65–99)
Glucose-Capillary: 106 mg/dL — ABNORMAL HIGH (ref 65–99)

## 2016-08-29 LAB — PROTIME-INR
INR: 1.63
Prothrombin Time: 19.5 s — ABNORMAL HIGH (ref 11.4–15.2)

## 2016-08-29 MED ORDER — WARFARIN SODIUM 5 MG PO TABS
5.0000 mg | ORAL_TABLET | Freq: Once | ORAL | Status: AC
  Administered 2016-08-29: 5 mg via ORAL
  Filled 2016-08-29: qty 1

## 2016-08-29 NOTE — Progress Notes (Signed)
Subjective: She says she feels okay. She has no new complaints. Her breathing is doing better.  Objective: Vital signs in last 24 hours: Temp:  [98.4 F (36.9 C)-99.4 F (37.4 C)] 98.5 F (36.9 C) (07/23 0604) Pulse Rate:  [74-78] 78 (07/23 0604) Resp:  [18-20] 18 (07/23 0604) BP: (129-149)/(58-119) 129/58 (07/23 0604) SpO2:  [92 %-100 %] 94 % (07/23 0733) Weight:  [98 kg (216 lb 0.8 oz)] 98 kg (216 lb 0.8 oz) (07/23 0604) Weight change: 0.7 kg (1 lb 8.7 oz) Last BM Date: 08/24/16  Intake/Output from previous day: 07/22 0701 - 07/23 0700 In: 720 [P.O.:720] Out: 850 [Urine:850]  PHYSICAL EXAM General appearance: alert, cooperative and no distress Resp: rales bibasilar Cardio: irregularly irregular rhythm GI: Her abdomen is soft with no masses Extremities: She still has some dependent edema of her arms and still edema of her legs Mucous membranes are moist. She is very hard of hearing  Lab Results:  Results for orders placed or performed during the hospital encounter of 08/18/16 (from the past 48 hour(s))  Glucose, capillary     Status: Abnormal   Collection Time: 08/27/16 11:19 AM  Result Value Ref Range   Glucose-Capillary 123 (H) 65 - 99 mg/dL  Glucose, capillary     Status: Abnormal   Collection Time: 08/27/16  4:22 PM  Result Value Ref Range   Glucose-Capillary 146 (H) 65 - 99 mg/dL  Glucose, capillary     Status: Abnormal   Collection Time: 08/27/16  8:40 PM  Result Value Ref Range   Glucose-Capillary 146 (H) 65 - 99 mg/dL   Comment 1 Notify RN    Comment 2 Document in Chart   Protime-INR     Status: Abnormal   Collection Time: 08/28/16  6:22 AM  Result Value Ref Range   Prothrombin Time 20.4 (H) 11.4 - 15.2 seconds   INR 0.17   Basic metabolic panel     Status: Abnormal   Collection Time: 08/28/16  6:22 AM  Result Value Ref Range   Sodium 140 135 - 145 mmol/L   Potassium 4.2 3.5 - 5.1 mmol/L    Comment: DELTA CHECK NOTED NO VISIBLE HEMOLYSIS    Chloride 101 101 - 111 mmol/L   CO2 29 22 - 32 mmol/L   Glucose, Bld 156 (H) 65 - 99 mg/dL   BUN 70 (H) 6 - 20 mg/dL   Creatinine, Ser 2.04 (H) 0.44 - 1.00 mg/dL   Calcium 8.1 (L) 8.9 - 10.3 mg/dL   GFR calc non Af Amer 21 (L) >60 mL/min   GFR calc Af Amer 25 (L) >60 mL/min    Comment: (NOTE) The eGFR has been calculated using the CKD EPI equation. This calculation has not been validated in all clinical situations. eGFR's persistently <60 mL/min signify possible Chronic Kidney Disease.    Anion gap 10 5 - 15  Glucose, capillary     Status: Abnormal   Collection Time: 08/28/16  7:24 AM  Result Value Ref Range   Glucose-Capillary 139 (H) 65 - 99 mg/dL  Glucose, capillary     Status: Abnormal   Collection Time: 08/28/16 11:30 AM  Result Value Ref Range   Glucose-Capillary 140 (H) 65 - 99 mg/dL  Glucose, capillary     Status: Abnormal   Collection Time: 08/28/16  4:28 PM  Result Value Ref Range   Glucose-Capillary 165 (H) 65 - 99 mg/dL  Glucose, capillary     Status: Abnormal   Collection Time: 08/28/16  9:11 PM  Result Value Ref Range   Glucose-Capillary 164 (H) 65 - 99 mg/dL  Glucose, capillary     Status: Abnormal   Collection Time: 08/29/16  7:32 AM  Result Value Ref Range   Glucose-Capillary 167 (H) 65 - 99 mg/dL   Comment 1 Notify RN    Comment 2 Document in Chart   CBC     Status: Abnormal   Collection Time: 08/29/16  7:55 AM  Result Value Ref Range   WBC 10.6 (H) 4.0 - 10.5 K/uL   RBC 2.93 (L) 3.87 - 5.11 MIL/uL   Hemoglobin 9.1 (L) 12.0 - 15.0 g/dL   HCT 27.7 (L) 36.0 - 46.0 %   MCV 94.5 78.0 - 100.0 fL   MCH 31.1 26.0 - 34.0 pg   MCHC 32.9 30.0 - 36.0 g/dL   RDW 15.3 11.5 - 15.5 %   Platelets 250 150 - 400 K/uL    ABGS No results for input(s): PHART, PO2ART, TCO2, HCO3 in the last 72 hours.  Invalid input(s): PCO2 CULTURES Recent Results (from the past 240 hour(s))  MRSA PCR Screening     Status: None   Collection Time: 08/19/16  3:44 PM  Result Value  Ref Range Status   MRSA by PCR NEGATIVE NEGATIVE Final    Comment:        The GeneXpert MRSA Assay (FDA approved for NASAL specimens only), is one component of a comprehensive MRSA colonization surveillance program. It is not intended to diagnose MRSA infection nor to guide or monitor treatment for MRSA infections.   Urine Culture     Status: Abnormal   Collection Time: 08/26/16 10:43 AM  Result Value Ref Range Status   Specimen Description URINE, CATHETERIZED  Final   Special Requests NONE  Final   Culture (A)  Final    >=100,000 COLONIES/mL KLEBSIELLA PNEUMONIAE 50,000 COLONIES/mL GROUP B STREP(S.AGALACTIAE)ISOLATED TESTING AGAINST S. AGALACTIAE NOT ROUTINELY PERFORMED DUE TO PREDICTABILITY OF AMP/PEN/VAN SUSCEPTIBILITY. Performed at [facility], [place] [address] [place], [place] [address]    Report Status 08/28/2016 FINAL  Final   Organism ID, Bacteria KLEBSIELLA PNEUMONIAE (A)  Final      Susceptibility   Klebsiella pneumoniae - MIC*    AMPICILLIN RESISTANT Resistant     CEFAZOLIN <=4 SENSITIVE Sensitive     CEFTRIAXONE <=1 SENSITIVE Sensitive     CIPROFLOXACIN <=0.25 SENSITIVE Sensitive     GENTAMICIN <=1 SENSITIVE Sensitive     IMIPENEM <=0.25 SENSITIVE Sensitive     NITROFURANTOIN 32 SENSITIVE Sensitive     TRIMETH/SULFA <=20 SENSITIVE Sensitive     AMPICILLIN/SULBACTAM 4 SENSITIVE Sensitive     PIP/TAZO <=4 SENSITIVE Sensitive     Extended ESBL NEGATIVE Sensitive     * >=100,000 COLONIES/mL KLEBSIELLA PNEUMONIAE   Studies/Results: No results found.  Medications:  Prior to Admission:  Prescriptions Prior to Admission  Medication Sig Dispense Refill Last Dose  . acetaminophen (TYLENOL) 325 MG tablet Take 2 tablets (650 mg total) by mouth every 4 (four) hours as needed for mild pain, moderate pain, fever or headache.   unknown  . ALPRAZolam (XANAX) 0.25 MG tablet Take 0.25 mg by mouth 3 (three) times daily as needed.   unknown  . amLODipine (NORVASC) 2.5  MG tablet TAKE (1) TABLET BY MOUTH DAILY. 30 tablet 6 08/18/2016 at Unknown time  . atorvastatin (LIPITOR) 40 MG tablet Take 1 tablet (40 mg total) by mouth daily. 90 tablet 3 08/18/2016 at Unknown time  .  clopidogrel (PLAVIX) 75 MG tablet Take 1 tablet (75 mg total) by mouth daily. 90 tablet 3 08/18/2016 at Unknown time  . glimepiride (AMARYL) 2 MG tablet Take 2 mg by mouth daily.     08/18/2016 at Unknown time  . hydrALAZINE (APRESOLINE) 100 MG tablet Take 1 tablet (100 mg total) by mouth 3 (three) times daily. 270 tablet 3 08/18/2016 at Unknown time  . HYDROcodone-acetaminophen (NORCO/VICODIN) 5-325 MG tablet Take 1 tablet by mouth 4 (four) times daily.    08/18/2016 at Unknown time  . insulin detemir (LEVEMIR) 100 UNIT/ML injection Inject 0.2 mLs (20 Units total) into the skin at bedtime. 10 mL 11 08/17/2016 at Unknown time  . insulin lispro (HUMALOG) 100 UNIT/ML injection Inject 0-0.15 mLs (0-15 Units total) into the skin 3 (three) times daily with meals. Use as sliding scale 10 mL 11 08/18/2016 at Unknown time  . isosorbide mononitrate (IMDUR) 60 MG 24 hr tablet Take 1 tablet (60 mg total) by mouth daily. 90 tablet 3 08/18/2016 at Unknown time  . nitroGLYCERIN (NITROSTAT) 0.4 MG SL tablet Place 1 tablet (0.4 mg total) under the tongue every 5 (five) minutes as needed for chest pain. 25 tablet 2 unknown  . pantoprazole (PROTONIX) 40 MG tablet Take 1 tablet (40 mg total) by mouth daily at 6 (six) AM. 90 tablet 3 08/18/2016 at Unknown time  . PROAIR HFA 108 (90 Base) MCG/ACT inhaler Inhale 1-2 puffs into the lungs 4 (four) times daily.    08/18/2016 at Unknown time  . torsemide (DEMADEX) 20 MG tablet Take 2 tablets (40 mg total) by mouth 2 (two) times daily. 120 tablet 6 08/18/2016 at Unknown time  . TRAVATAN Z 0.004 % SOLN ophthalmic solution Place 1 drop into both eyes at bedtime.    08/17/2016 at Unknown time  . umeclidinium bromide (INCRUSE ELLIPTA) 62.5 MCG/INH AEPB Inhale 1 puff into the lungs daily.    08/18/2016 at Unknown time  . warfarin (COUMADIN) 5 MG tablet TAKE 1 TABLET BY MOUTH DAILY EXCEPT 1/2 TABLET ON WEDNESDAYS AND SATURDAYS. 30 tablet 6 08/17/2016 at 2130   Scheduled: . amLODipine  5 mg Oral Daily  . atorvastatin  40 mg Oral q1800  . clopidogrel  75 mg Oral Daily  . hydrALAZINE  100 mg Oral TID  . insulin aspart  0-15 Units Subcutaneous TID WC  . insulin aspart  0-5 Units Subcutaneous QHS  . insulin aspart  4 Units Subcutaneous TID WC  . ipratropium-albuterol  3 mL Nebulization BID  . isosorbide mononitrate  90 mg Oral Daily  . latanoprost  1 drop Both Eyes QHS  . pantoprazole  40 mg Oral Q0600  . primidone  50 mg Oral Daily  . sodium chloride flush  3 mL Intravenous Q12H  . Warfarin - Pharmacist Dosing Inpatient   Does not apply Q24H   Continuous: . sodium chloride     XBD:ZHGDJM chloride, acetaminophen, albuterol, ALPRAZolam, bisacodyl, bisacodyl, magnesium hydroxide, metoprolol tartrate, morphine injection, ondansetron (ZOFRAN) IV, sodium chloride flush  Assesment: She was admitted with acute on chronic diastolic heart failure. I stopped her IV diuretics yesterday because her renal function had deteriorated. She is chronically anticoagulated. She is anemic likely from her chronic kidney disease. She is very weak. I discussed her situation with her daughter at bedside yesterday and she does not feel like she's going to be able to manage her mother at home. I think that's probably correct. I have requested repeat PT evaluation for potential rehabilitation placement Principal  Problem:   Acute on chronic diastolic CHF (congestive heart failure) (HCC) Active Problems:   Essential hypertension, benign   Persistent atrial fibrillation ([place])   Long term current use of anticoagulant   Chronic kidney disease (CKD), stage IV (severe) (HCC)   Status post coronary artery stent placement   Diabetes mellitus type 2, controlled ([place])   Acute on chronic diastolic (congestive) heart  failure ([place])    Plan: As above    LOS: 10 days   Kadrian Partch L 08/29/2016, 8:48 AM

## 2016-08-29 NOTE — Progress Notes (Signed)
ANTICOAGULATION CONSULT NOTE  Pharmacy Consult for COUMADIN (home med) Indication: atrial fibrillation  No Known Allergies  Patient Measurements: Height: 5\' 4"  (162.6 cm) Weight: 216 lb 0.8 oz (98 kg) IBW/kg (Calculated) : 54.7  Vital Signs: Temp: 98.5 F (36.9 C) (07/23 0604) Temp Source: Oral (07/23 0604) BP: 129/58 (07/23 0604) Pulse Rate: 78 (07/23 0604)  Labs:  Recent Labs  08/27/16 0623 08/28/16 0622 08/29/16 0755  HGB  --   --  9.1*  HCT  --   --  27.7*  PLT  --   --  250  LABPROT 21.0* 20.4* 19.5*  INR 1.79 1.72 1.63  CREATININE 1.63* 2.04*  --    Estimated Creatinine Clearance: 23.8 mL/min (A) (by C-G formula based on SCr of 2.04 mg/dL (H)).  Medical History: Past Medical History:  Diagnosis Date  . Atrial fibrillation (HCC)   . CAD (coronary artery disease)    Multivessel status post high risk PCI/DES to circumflex 11/2015 (poor candidate for CABG)  . CHF (congestive heart failure) (HCC)   . CKD (chronic kidney disease) stage 3, GFR 30-59 ml/min   . DDD (degenerative disc disease), lumbar   . Essential hypertension, benign   . Mixed hyperlipidemia   . Type 2 diabetes mellitus (HCC)    Medications:  Prescriptions Prior to Admission  Medication Sig Dispense Refill Last Dose  . acetaminophen (TYLENOL) 325 MG tablet Take 2 tablets (650 mg total) by mouth every 4 (four) hours as needed for mild pain, moderate pain, fever or headache.   unknown  . ALPRAZolam (XANAX) 0.25 MG tablet Take 0.25 mg by mouth 3 (three) times daily as needed.   unknown  . amLODipine (NORVASC) 2.5 MG tablet TAKE (1) TABLET BY MOUTH DAILY. 30 tablet 6 08/18/2016 at Unknown time  . atorvastatin (LIPITOR) 40 MG tablet Take 1 tablet (40 mg total) by mouth daily. 90 tablet 3 08/18/2016 at Unknown time  . clopidogrel (PLAVIX) 75 MG tablet Take 1 tablet (75 mg total) by mouth daily. 90 tablet 3 08/18/2016 at Unknown time  . glimepiride (AMARYL) 2 MG tablet Take 2 mg by mouth daily.      08/18/2016 at Unknown time  . hydrALAZINE (APRESOLINE) 100 MG tablet Take 1 tablet (100 mg total) by mouth 3 (three) times daily. 270 tablet 3 08/18/2016 at Unknown time  . HYDROcodone-acetaminophen (NORCO/VICODIN) 5-325 MG tablet Take 1 tablet by mouth 4 (four) times daily.    08/18/2016 at Unknown time  . insulin detemir (LEVEMIR) 100 UNIT/ML injection Inject 0.2 mLs (20 Units total) into the skin at bedtime. 10 mL 11 08/17/2016 at Unknown time  . insulin lispro (HUMALOG) 100 UNIT/ML injection Inject 0-0.15 mLs (0-15 Units total) into the skin 3 (three) times daily with meals. Use as sliding scale 10 mL 11 08/18/2016 at Unknown time  . isosorbide mononitrate (IMDUR) 60 MG 24 hr tablet Take 1 tablet (60 mg total) by mouth daily. 90 tablet 3 08/18/2016 at Unknown time  . nitroGLYCERIN (NITROSTAT) 0.4 MG SL tablet Place 1 tablet (0.4 mg total) under the tongue every 5 (five) minutes as needed for chest pain. 25 tablet 2 unknown  . pantoprazole (PROTONIX) 40 MG tablet Take 1 tablet (40 mg total) by mouth daily at 6 (six) AM. 90 tablet 3 08/18/2016 at Unknown time  . PROAIR HFA 108 (90 Base) MCG/ACT inhaler Inhale 1-2 puffs into the lungs 4 (four) times daily.    08/18/2016 at Unknown time  . torsemide (DEMADEX) 20 MG tablet Take  2 tablets (40 mg total) by mouth 2 (two) times daily. 120 tablet 6 08/18/2016 at Unknown time  . TRAVATAN Z 0.004 % SOLN ophthalmic solution Place 1 drop into both eyes at bedtime.    08/17/2016 at Unknown time  . umeclidinium bromide (INCRUSE ELLIPTA) 62.5 MCG/INH AEPB Inhale 1 puff into the lungs daily.   08/18/2016 at Unknown time  . warfarin (COUMADIN) 5 MG tablet TAKE 1 TABLET BY MOUTH DAILY EXCEPT 1/2 TABLET ON WEDNESDAYS AND SATURDAYS. 30 tablet 6 08/17/2016 at 2130   Assessment: 83yo female on chronic Coumadin PTA for h/o afib.  Warfarin previously held due to elevated INR and anemia.  OK to restart per cardiology on 7/17.  INR below goal again today .  Patient on day 2/3  bactrim  Goal of Therapy:  INR 2-3 Monitor platelets by anticoagulation protocol: Yes   Plan:  Coumadin 5mg  today x 1  INR daily Monitor for signs and symptoms of bleeding.   Whyatt Klinger P[place]et 08/29/2016,10:35 AM

## 2016-08-29 NOTE — Evaluation (Signed)
Physical Therapy Evaluation Patient Details Name: Melissa [name] MRN: [id number] DOB: 02-15-1930 Today's Date: 08/29/2016   History of Present Illness  Melissa [name] is an 83yo black female who comes to [facility] admitted for acute on chronic CHF exacerbation. Pt also being seen by cardiology for extensive coronary artery disease x3 not thought to be appropriate for CABG at this time. PMH: LTHA, [id number]) c AF and total LAD occlusion, IDDM2. Pt was evaluated on 7/20 by PT but thought to have progressively declined since then. PT is asked to re-evaluate the palcement for DC updated recommendations.   Clinical Impression  Pt admitted with above diagnosis. Pt currently with functional limitations due to the deficits listed below (see "PT Problem List"). Upon entry, the patient is received semirecumbent in bed, daughter arriving half-way through session. The pt is awake and agreeable to participate. No acute distress noted at this time, denying pain, however the patient is writhing in bed throughout questioning without explanation. Daughter says this motoric behavior is new with current admission and relates it to 'fluid overload,' however this author more commonly sees this type of movement in patients with tardive dyskinesia or Huntington's disease: would appreciate more info on this going forward. The pt is alert and oriented x3, although she does require a few attempts to correctly answer these questions, and 25% of other questions remain unanswered, likely due to [facility] and/or mild confusion. Pt received on and remaining on 2L O2 throughout evaluation, with noted saturation of >94%. Pt reports no O2 use at home. Strength/MMT screening reveals 4-/5 strength grossly in RUE. Functional mobility assessment demonstrates moderate weakness, pt now requiring heavy effort with RW to come to standing whereas she was able to do this with SPC at baseline ad lib with minimal effort. Bed mobility is performed at  supervision level. Amb is not trialed at this time due to weakness noted during stand-pivot transfer and unclear etiology of writhing movements in bed. The patient is unable to amb distances required to safely access the home, whereas she was able to access the community at baseline. She has insufficicent support at home as her daughter works, PT recommending supervision with all mobility OOB. Pt will benefit from skilled PT intervention to increase independence and safety with basic mobility in preparation for discharge to the venue listed below.       Follow Up Recommendations SNF    Equipment Recommendations   (rollator walker due to cardiac limitations )    Recommendations for Other Services       Precautions / Restrictions Precautions Precautions: None Precaution Comments: Right broken hand (June 2018)  Restrictions Weight Bearing Restrictions: No      Mobility  Bed Mobility Overal bed mobility: Needs Assistance Bed Mobility: Supine to Sit     Supine to sit: Supervision        Transfers Overall transfer level: Needs assistance Equipment used: Standard walker Transfers: Squat Pivot Transfers     Squat pivot transfers: Min guard        Ambulation/Gait Ambulation/Gait assistance:  (not safe to attempt at this time. Pt writhing in bed)              Stairs            Wheelchair Mobility    Modified Rankin (Stroke Patients Only)       Balance Overall balance assessment: History of Falls;Needs assistance (1 fall in last 6 months, ) Sitting-balance support: No upper extremity supported;Feet unsupported Sitting balance-Leahy Scale: Good  Standing balance support: Bilateral upper extremity supported;During functional activity Standing balance-Leahy Scale: Fair                               Pertinent Vitals/Pain Pain Assessment: No/denies pain    Home Living Family/patient expects to be discharged to:: Skilled nursing  facility Living Arrangements: Children Available Help at Discharge: Family Type of Home: House Home Access: Stairs to enter Entrance Stairs-Rails: Right Entrance Stairs-Number of Steps: 1 Home Layout: One level Home Equipment: Walker - 2 wheels;Cane - single point      Prior Function Level of Independence: Needs assistance   Gait / Transfers Assistance Needed: SPC for household distances (occasional access of community), walker more recently due to weakness.   ADL's / Homemaking Assistance Needed: Daughter assists with dressing, bathing, mediations, groceries, meals.         Hand Dominance   Dominant Hand: Left    Extremity/Trunk Assessment   Upper Extremity Assessment Upper Extremity Assessment: Generalized weakness;RUE deficits/detail RUE Deficits / Details: 4-/5 grossly;    Lower Extremity Assessment Lower Extremity Assessment: Generalized weakness       Communication   Communication: HOH  Cognition Arousal/Alertness: Awake/alert Behavior During Therapy: WFL for tasks assessed/performed Overall Cognitive Status: Within Functional Limits for tasks assessed                                        General Comments      Exercises     Assessment/Plan    PT Assessment Patient needs continued PT services  PT Problem List Decreased balance;Decreased activity tolerance;Decreased strength;Decreased mobility       PT Treatment Interventions Gait training;Therapeutic exercise;Therapeutic activities;Functional mobility training;Balance training;Cognitive remediation;Patient/family education    PT Goals (Current goals can be found in the Care Plan section)  Acute Rehab PT Goals Patient Stated Goal: regain safe mobility in home  PT Goal Formulation: With family Time For Goal Achievement: 08/29/16 Potential to Achieve Goals: Fair    Frequency Min 2X/week   Barriers to discharge Inaccessible home environment;Decreased caregiver support       Co-evaluation               AM-PAC PT "6 Clicks" Daily Activity  Outcome Measure Difficulty turning over in bed (including adjusting bedclothes, sheets and blankets)?: A Little Difficulty moving from lying on back to sitting on the side of the bed? : A Little Difficulty sitting down on and standing up from a chair with arms (e.g., wheelchair, bedside commode, etc,.)?: A Little Help needed moving to and from a bed to chair (including a wheelchair)?: A Little Help needed walking in hospital room?: A Lot Help needed climbing 3-5 steps with a railing? : Total 6 Click Score: 15    End of Session   Activity Tolerance: Patient tolerated treatment well;Patient limited by fatigue Patient left: with call bell/phone within reach;with bed alarm set;in chair;with family/visitor present   PT Visit Diagnosis: Unsteadiness on feet (R26.81);Repeated falls (R29.6);Muscle weakness (generalized) (M62.81)    Time: 1315-1340 PT Time Calculation (min) (ACUTE ONLY): 25 min   Charges:   PT Evaluation $PT Eval Moderate Complexity: 1 Procedure     PT G Codes:        2:08 PM, 08/29/16 Rosamaria LintsAllan C Asanti Craigo, PT, DPT Physical Therapist - [place] [phone](ASCOM)  [phone] (Office)   Alvera NovelBuccola,Derron Pipkins  C 08/29/2016, 2:00 PM

## 2016-08-29 NOTE — Progress Notes (Addendum)
Late Entry:  Approx 1500 I was called to the room due the NT stating that the patient was c/o chest pain.  I went down to assess the patient and it was more so her LUQ in her abdominal area.  I palpated the area and it appeared to be hard.  Discussed with the patient and her daughter the last time the patient had a BM.  Both stated that she had not had one in a while.  So I administered dulcolax supp and Morphine as prescribed [facility]  The patient had a large BM and stated that she felt better after the activity.  Dr. [name] was notified of the situation.  No further orders were given at this time.  Patient was left comfortable in recliner siting awaiting super.  Also notified the MD of the patient BP and asked if it was okay to give hydralazine.  MD stated it was okay to give the med.

## 2016-08-29 NOTE — Progress Notes (Signed)
Progress Note  Patient Name: Melissa HammedBettie L Hagberg Date of Encounter: 08/29/2016  Primary Cardiologist: Diona BrownerMcDowell  Subjective   Complaining of being cold. Legs still hurt.   Inpatient Medications    Scheduled Meds: . amLODipine  5 mg Oral Daily  . atorvastatin  40 mg Oral q1800  . clopidogrel  75 mg Oral Daily  . hydrALAZINE  100 mg Oral TID  . insulin aspart  0-15 Units Subcutaneous TID WC  . insulin aspart  0-5 Units Subcutaneous QHS  . insulin aspart  4 Units Subcutaneous TID WC  . ipratropium-albuterol  3 mL Nebulization BID  . isosorbide mononitrate  90 mg Oral Daily  . latanoprost  1 drop Both Eyes QHS  . pantoprazole  40 mg Oral Q0600  . primidone  50 mg Oral Daily  . sodium chloride flush  3 mL Intravenous Q12H  . Warfarin - Pharmacist Dosing Inpatient   Does not apply Q24H   Continuous Infusions: . sodium chloride     PRN Meds: sodium chloride, acetaminophen, albuterol, ALPRAZolam, bisacodyl, bisacodyl, magnesium hydroxide, metoprolol tartrate, morphine injection, ondansetron (ZOFRAN) IV, sodium chloride flush   Vital Signs    Vitals:   08/28/16 2108 08/29/16 0153 08/29/16 0604 08/29/16 0733  BP: 132/63  (!) 129/58   Pulse: 74  78   Resp: 20  18   Temp: 98.4 F (36.9 C)  98.5 F (36.9 C)   TempSrc: Oral  Oral   SpO2: 98% 92% 100% 94%  Weight:   216 lb 0.8 oz (98 kg)   Height:        Intake/Output Summary (Last 24 hours) at 08/29/16 0742 Last data filed at 08/28/16 2252  Gross per 24 hour  Intake              720 ml  Output              850 ml  Net             -130 ml   Filed Weights   08/27/16 0505 08/28/16 0516 08/29/16 0604  Weight: 211 lb 10.3 oz (96 kg) 214 lb 8.1 oz (97.3 kg) 216 lb 0.8 oz (98 kg)    Telemetry    Atrial fib - HR 70's.  Personally Reviewed  Physical Exam   GEN: No acute distress Neck: No JVD Cardiac: IRRR, no murmurs, rubs, or gallops.  Respiratory: Bilateral crackles without coughing or wheezing.  GI: Soft,  nontender, non-distended  MS: 2+ bilateral pitting pretibial edema; No deformity. Neuro:  Nonfocal  Psych: Mildly confused.   Labs    Chemistry Recent Labs Lab 08/26/16 0450 08/27/16 0623 08/28/16 0622  NA 140 142 140  K 3.5 3.3* 4.2  CL 100* 102 101  CO2 30 31 29   GLUCOSE 30* 34* 156*  BUN 78* 69* 70*  CREATININE 1.58* 1.63* 2.04*  CALCIUM 7.9* 7.9* 8.1*  GFRNONAA 29* 28* 21*  GFRAA 34* 33* 25*  ANIONGAP 10 9 10      Hematology Recent Labs Lab 08/23/16 0511 08/25/16 0930 08/26/16 0450  WBC 7.8 10.0 13.3*  RBC 2.77* 2.90* 2.98*  HGB 8.8* 9.1* 9.2*  HCT 26.5* 27.8* 28.2*  MCV 95.7 95.9 94.6  MCH 31.8 31.4 30.9  MCHC 33.2 32.7 32.6  RDW 15.3 15.2 15.4  PLT 323 307 293     Radiology    Cardiac Studies   Echocardiogram 08/19/2016 Left ventricle: The cavity size was normal. Wall thickness was increased in a pattern of moderate  LVH. Systolic function was normal. The estimated ejection fraction was in the range of 60% to 65%. - Aortic valve: Moderately calcified annulus. Trileaflet; moderately thickened leaflets. Valve area (VTI): 2.11 cm^2. Valve area (Vmax): 1.68 cm^2. Valve area (Vmean): 1.61 cm^2. - Mitral valve: There was mild regurgitation. - Left atrium: The atrium was massively dilated. - Right ventricle: The ventricular septum is flattened in systole and diastole suggesting RV pressure and volume overload. The cavity size was moderately dilated. - Right atrium: The atrium was massively dilated. - Atrial septum: No defect or patent foramen ovale was identified. - Tricuspid valve: There was moderate-severe regurgitation. There is hepatic systolic flow reversal sugesting severe TR. - Pulmonary arteries: Systolic pressure was severely increased. PA peak pressure: 77 mm Hg (S). - Technically adequate study.  Patient Profile     83 y.o. female  hx of chronic diastolic heart failure, chronic atrial fibrillation on coumadin, coronary  artery disease with high risk DES intervention to the circumflex in 11/2015, chronic kidney disease stage IIIwho is being seen today for the evaluation of chest pain and dyspnea at the request of Dr. Juanetta Gosling. Diagnosed with acute on chronic diastolic CHF.    Assessment & Plan    1.Atrial fib: Rate is controlled. INR 1.72 this am on coumadin.   2. Acute on Chronic Diastolic CHF: She has diuresed 13,160 cc since admission. Weight is down from highest recorded weight of 224 lbs to 216 lbs this am.   IV lasix was stopped yesterday due to worsening renal status, creatinine rising to 2.04 from 1.63. She still has significant LEE edema with pain. I had ordered TED hose for assistance in venous return. They will need to be applied today. Consider po torsemide   3.CAD: DES to Cx. Continues on Plavix, no ASA in the setting of coumadin therapy.   4. UTI: Foley catheter had been in place for several days. Now on bacterium . She is mildly confused. May be from UTI. Follow.   5.. Moderate to  Severe TR: Noted per echocardiogram this admission with pulmonary hypertension.   6.  CKD Stage III: Worsening renal function per labs this am. IV diuretics stopped.   Signed, Bettey Mare. Liborio Nixon, ANP, AACC   08/29/2016, 7:42 AM    Attending note Patient seen and discussed with DNP Lyman Bishop, I agree with her docuemntation above. Admitted with acute on chronic diastoic HF. Echo 7/13/138 with LVEF 60-65%, cannot eval diastolic function in setting of afib, moderate RV dilatation w/ normal function, mod to severe TR, PASP 77. 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 Negative yesterday, negative 13 liters since admission. Significant elevation in Cr today to 2, she received IV lasix 80mg  x 1 yesterday. Hold diuretics today. Historically her Cr has been fairly labile, baseline looks to be around 1.7. Perhaps holding her diuretics today will allow her  to mobilize more fluid from tertiary space into intravascular for further diuresis tomorrow.   Dina Rich MD

## 2016-08-29 NOTE — Patient Outreach (Signed)
[facility] Care Management  08/29/2016  Carley HammedBettie L Traweek 01-28-1931 [id number]  Follow up re: [facility]. [facility]. She is currently hospitalized at [facility] where she was admitted for treatment of acute on chronic diastolic heart failure. Her IV diuretics were stopped yesterday secondary to deterioration of her renal function. She also has anemia and has remained quite weak. Mrs. Archuleta's daughter is concerned about her ability to provide the needed level of care for her mother at home and discussed this with Dr. [name] who has ordered a PT consult to evaluate for level of care needs.    Plan: I will follow up on Mrs. Chesmore's progress and further recommendations about level of care. If she is discharged to home, I will reach out to her and her daughter within 48 hours of discharge.    Marja Kayslisa Gilboy MHA,BSN,RN,CCM [facility] Care Management  [phone]

## 2016-08-29 NOTE — Progress Notes (Signed)
Pt able to turn self with minimal assist, but refusing to turn on side. RN educated pt about turning self in bed at least every 2 hours to prevent skin breakdown. Pt verbalized understanding but stated that she wasn't comfortable on her side d/t her R leg pain and she wanted to stay on her back. Daughter in room with pt as well. RN will continue to monitor and re-educate pt.

## 2016-08-30 ENCOUNTER — Inpatient Hospital Stay (HOSPITAL_COMMUNITY): Payer: Medicare Other

## 2016-08-30 LAB — GLUCOSE, CAPILLARY
GLUCOSE-CAPILLARY: 336 mg/dL — AB (ref 65–99)
GLUCOSE-CAPILLARY: 51 mg/dL — AB (ref 65–99)
Glucose-Capillary: 124 mg/dL — ABNORMAL HIGH (ref 65–99)
Glucose-Capillary: 185 mg/dL — ABNORMAL HIGH (ref 65–99)
Glucose-Capillary: 202 mg/dL — ABNORMAL HIGH (ref 65–99)
Glucose-Capillary: 76 mg/dL (ref 65–99)
Glucose-Capillary: 96 mg/dL (ref 65–99)

## 2016-08-30 LAB — BASIC METABOLIC PANEL
ANION GAP: 11 (ref 5–15)
BUN: 65 mg/dL — ABNORMAL HIGH (ref 6–20)
CO2: 28 mmol/L (ref 22–32)
Calcium: 8.2 mg/dL — ABNORMAL LOW (ref 8.9–10.3)
Chloride: 97 mmol/L — ABNORMAL LOW (ref 101–111)
Creatinine, Ser: 2.41 mg/dL — ABNORMAL HIGH (ref 0.44–1.00)
GFR calc Af Amer: 20 mL/min — ABNORMAL LOW (ref >60)
GFR, EST NON AFRICAN AMERICAN: 17 mL/min — AB (ref >60)
Glucose, Bld: 182 mg/dL — ABNORMAL HIGH (ref 65–99)
POTASSIUM: 4.1 mmol/L (ref 3.5–5.1)
SODIUM: 136 mmol/L (ref 135–145)

## 2016-08-30 LAB — PROTIME-INR
INR: 1.61
Prothrombin Time: 19.3 seconds — ABNORMAL HIGH (ref 11.4–15.2)

## 2016-08-30 MED ORDER — AMLODIPINE BESYLATE 5 MG PO TABS
10.0000 mg | ORAL_TABLET | Freq: Every day | ORAL | Status: DC
  Administered 2016-08-30 – 2016-09-02 (×4): 10 mg via ORAL
  Filled 2016-08-30 (×4): qty 2

## 2016-08-30 MED ORDER — WARFARIN SODIUM 5 MG PO TABS
7.5000 mg | ORAL_TABLET | Freq: Once | ORAL | Status: AC
  Administered 2016-08-30: 7.5 mg via ORAL
  Filled 2016-08-30: qty 1

## 2016-08-30 NOTE — Progress Notes (Signed)
ANTICOAGULATION CONSULT NOTE  Pharmacy Consult for COUMADIN (home med) Indication: atrial fibrillation  No Known Allergies  Patient Measurements: Height: 5\' 4"  (162.6 cm) Weight: 215 lb 6.2 oz (97.7 kg) IBW/kg (Calculated) : 54.7  Vital Signs: Temp: 98.5 F (36.9 C) (07/24 0609) Temp Source: Oral (07/24 0609) BP: 147/76 (07/24 0410) Pulse Rate: 76 (07/24 0410)  Labs:  Recent Labs  08/28/16 0622 08/29/16 0755 08/30/16 0602  HGB  --  9.1*  --   HCT  --  27.7*  --   PLT  --  250  --   LABPROT 20.4* 19.5* 19.3*  INR 1.72 1.63 1.61  CREATININE 2.04* 2.25* 2.41*   Estimated Creatinine Clearance: 20.1 mL/min (A) (by C-G formula based on SCr of 2.41 mg/dL (H)).  Medical History: Past Medical History:  Diagnosis Date  . Atrial fibrillation (HCC)   . CAD (coronary artery disease)    Multivessel status post high risk PCI/DES to circumflex 11/2015 (poor candidate for CABG)  . CHF (congestive heart failure) (HCC)   . CKD (chronic kidney disease) stage 3, GFR 30-59 ml/min   . DDD (degenerative disc disease), lumbar   . Essential hypertension, benign   . Mixed hyperlipidemia   . Type 2 diabetes mellitus (HCC)    Medications:  Prescriptions Prior to Admission  Medication Sig Dispense Refill Last Dose  . acetaminophen (TYLENOL) 325 MG tablet Take 2 tablets (650 mg total) by mouth every 4 (four) hours as needed for mild pain, moderate pain, fever or headache.   unknown  . ALPRAZolam (XANAX) 0.25 MG tablet Take 0.25 mg by mouth 3 (three) times daily as needed.   unknown  . amLODipine (NORVASC) 2.5 MG tablet TAKE (1) TABLET BY MOUTH DAILY. 30 tablet 6 08/18/2016 at Unknown time  . atorvastatin (LIPITOR) 40 MG tablet Take 1 tablet (40 mg total) by mouth daily. 90 tablet 3 08/18/2016 at Unknown time  . clopidogrel (PLAVIX) 75 MG tablet Take 1 tablet (75 mg total) by mouth daily. 90 tablet 3 08/18/2016 at Unknown time  . glimepiride (AMARYL) 2 MG tablet Take 2 mg by mouth daily.      08/18/2016 at Unknown time  . hydrALAZINE (APRESOLINE) 100 MG tablet Take 1 tablet (100 mg total) by mouth 3 (three) times daily. 270 tablet 3 08/18/2016 at Unknown time  . HYDROcodone-acetaminophen (NORCO/VICODIN) 5-325 MG tablet Take 1 tablet by mouth 4 (four) times daily.    08/18/2016 at Unknown time  . insulin detemir (LEVEMIR) 100 UNIT/ML injection Inject 0.2 mLs (20 Units total) into the skin at bedtime. 10 mL 11 08/17/2016 at Unknown time  . insulin lispro (HUMALOG) 100 UNIT/ML injection Inject 0-0.15 mLs (0-15 Units total) into the skin 3 (three) times daily with meals. Use as sliding scale 10 mL 11 08/18/2016 at Unknown time  . isosorbide mononitrate (IMDUR) 60 MG 24 hr tablet Take 1 tablet (60 mg total) by mouth daily. 90 tablet 3 08/18/2016 at Unknown time  . nitroGLYCERIN (NITROSTAT) 0.4 MG SL tablet Place 1 tablet (0.4 mg total) under the tongue every 5 (five) minutes as needed for chest pain. 25 tablet 2 unknown  . pantoprazole (PROTONIX) 40 MG tablet Take 1 tablet (40 mg total) by mouth daily at 6 (six) AM. 90 tablet 3 08/18/2016 at Unknown time  . PROAIR HFA 108 (90 Base) MCG/ACT inhaler Inhale 1-2 puffs into the lungs 4 (four) times daily.    08/18/2016 at Unknown time  . torsemide (DEMADEX) 20 MG tablet Take 2 tablets (  40 mg total) by mouth 2 (two) times daily. 120 tablet 6 08/18/2016 at Unknown time  . TRAVATAN Z 0.004 % SOLN ophthalmic solution Place 1 drop into both eyes at bedtime.    08/17/2016 at Unknown time  . umeclidinium bromide (INCRUSE ELLIPTA) 62.5 MCG/INH AEPB Inhale 1 puff into the lungs daily.   08/18/2016 at Unknown time  . warfarin (COUMADIN) 5 MG tablet TAKE 1 TABLET BY MOUTH DAILY EXCEPT 1/2 TABLET ON WEDNESDAYS AND SATURDAYS. 30 tablet 6 08/17/2016 at 2130   Assessment: 83yo female on chronic Coumadin PTA for h/o afib.  Warfarin previously held due to elevated INR and anemia.  OK to restart per cardiology on 7/17.  INR below goal again today .    Goal of Therapy:  INR  2-3 Monitor platelets by anticoagulation protocol: Yes   Plan:  Coumadin 7.5mg  today x 1 to boost INR INR daily Monitor for signs and symptoms of bleeding.   Margo AyeHall, Keyontae Huckeby A 08/30/2016,11:18 AM

## 2016-08-30 NOTE — Progress Notes (Signed)
Progress Note  Patient Name: Melissa [name] Date of Encounter: 08/30/2016  Primary Cardiologist: Diona Browner   Subjective   No complaints this am.   Inpatient Medications    Scheduled Meds: . amLODipine  5 mg Oral Daily  . atorvastatin  40 mg Oral q1800  . clopidogrel  75 mg Oral Daily  . hydrALAZINE  100 mg Oral TID  . insulin aspart  0-15 Units Subcutaneous TID WC  . insulin aspart  0-5 Units Subcutaneous QHS  . insulin aspart  4 Units Subcutaneous TID WC  . ipratropium-albuterol  3 mL Nebulization BID  . isosorbide mononitrate  90 mg Oral Daily  . latanoprost  1 drop Both Eyes QHS  . pantoprazole  40 mg Oral Q0600  . primidone  50 mg Oral Daily  . sodium chloride flush  3 mL Intravenous Q12H  . Warfarin - Pharmacist Dosing Inpatient   Does not apply Q24H   Continuous Infusions: . sodium chloride     PRN Meds: sodium chloride, acetaminophen, albuterol, ALPRAZolam, bisacodyl, bisacodyl, magnesium hydroxide, metoprolol tartrate, morphine injection, ondansetron (ZOFRAN) IV, sodium chloride flush   Vital Signs    Vitals:   08/30/16 0257 08/30/16 0410 08/30/16 0609 08/30/16 0721  BP:  (!) 147/76    Pulse:  76    Resp:  16    Temp:  98.7 F (37.1 C) 98.5 F (36.9 C)   TempSrc:  Oral Oral   SpO2:  96%  96%  Weight: 215 lb 6.2 oz (97.7 kg)     Height:        Intake/Output Summary (Last 24 hours) at 08/30/16 0751 Last data filed at 08/30/16 0735  Gross per 24 hour  Intake              840 ml  Output              950 ml  Net             -110 ml   Filed Weights   08/28/16 0516 08/29/16 0604 08/30/16 0257  Weight: 214 lb 8.1 oz (97.3 kg) 216 lb 0.8 oz (98 kg) 215 lb 6.2 oz (97.7 kg)    Telemetry    Atrial fib rates in the 70's Personally Reviewed  Physical Exam   GEN: No acute distress.   Neck: No JVD Cardiac: RRR, no murmurs, rubs, or gallops.  Respiratory: Some expiratory wheezes.  GI: Soft, nontender, non-distended  MS:2+ edema; No  deformity. Neuro:  Nonfocal  Psych: Normal affect   Labs    Chemistry Recent Labs Lab 08/28/16 0622 08/29/16 0755 08/30/16 0602  NA 140 138 136  K 4.2 4.2 4.1  CL 101 100* 97*  CO2 29 28 28   GLUCOSE 156* 168* 182*  BUN 70* 68* 65*  CREATININE 2.04* 2.25* 2.41*  CALCIUM 8.1* 8.2* 8.2*  GFRNONAA 21* 19* 17*  GFRAA 25* 22* 20*  ANIONGAP 10 10 11      Hematology Recent Labs Lab 08/25/16 0930 08/26/16 0450 08/29/16 0755  WBC 10.0 13.3* 10.6*  RBC 2.90* 2.98* 2.93*  HGB 9.1* 9.2* 9.1*  HCT 27.8* 28.2* 27.7*  MCV 95.9 94.6 94.5  MCH 31.4 30.9 31.1  MCHC 32.7 32.6 32.9  RDW 15.2 15.4 15.3  PLT 307 293 250    Radiology    No results found.  Cardiac Studies  Echocardiogram 08/19/2016 Left ventricle: The cavity size was normal. Wall thickness was increased in a pattern of moderate LVH. Systolic function was normal.  The estimated ejection fraction was in the range of 60% to 65%. - Aortic valve: Moderately calcified annulus. Trileaflet; moderately thickened leaflets. Valve area (VTI): 2.11 cm^2. Valve area (Vmax): 1.68 cm^2. Valve area (Vmean): 1.61 cm^2. - Mitral valve: There was mild regurgitation. - Left atrium: The atrium was massively dilated. - Right ventricle: The ventricular septum is flattened in systole and diastole suggesting RV pressure and volume overload. The cavity size was moderately dilated. - Right atrium: The atrium was massively dilated. - Atrial septum: No defect or patent foramen ovale was identified. - Tricuspid valve: There was moderate-severe regurgitation. There is hepatic systolic flow reversal sugesting severe TR. - Pulmonary arteries: Systolic pressure was severely increased. PA peak pressure: 77 mm Hg (S). - Technically adequate study.   Patient Profile     83 y.o. female hx of chronic diastolic heart failure, chronic atrial fibrillation on coumadin, coronary artery disease with high risk DES intervention to the  circumflex in 11/2015, chronic kidney disease stage IIIwho is being seen for the evaluation of chest pain and dyspnea at the request of Dr. [name]. Diagnosed with acute on chronic diastolic CHF.   Assessment & Plan    1. Acute on Chronic Diastolic CHF: Weight is less than recorded. I had the nurse re-weigh her without all of the extra linens. Now weight is 210 lbs. This is closer to baseline weight. I was unable to change it in the vital sign recording. She has diuresed 13,270 since admission but is positive 340 cc overnight off of lasix. This was held due to rising creatinine. Creatinine is increased this am to 2.41 from 2.25.   2. Atrial fib: Heart rate is controlled on metoprolol. She continues on coumadin per pharmacy. INR 1.61 currently. Still not therapeutic.   3. Hypertension: Remains elevated. On hydralazine, amlodipine 5 mg and isosorbide. She is on maximum dose of hydralazine, can consider increasing nitrates or CCB. This may increase LEE. Will increase amlodipine to 10 mg daily.   4. CKD Stage III: Creatinine is rising from yesterday off of diuretics for over 24 hours. Not on ACE or ARB.   Signed, Bettey MareKathryn M. Liborio NixonLawrence DNP, ANP, AACC   08/30/2016, 7:51 AM    Attending note Patient seen and discussed with DNP Lyman BishopLawrence, I agree with her documentation above. Admitted with acute on chronic diastoic HF. Echo 7/13/138 with LVEF 60-65%, cannot eval diastolic function in setting of afib, moderate RV dilatation w/ normal function, mod to severe TR, PASP 77. 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 I/Os incomplete from yesterday, urine output not recorded by nighshift. Oveall negative roughly 13 liters, yesterday held diuretics due to elevation Cr which continues to trend up. Evidence of peripheral edema on exam but perhaps intravacularly depleted, continue to hold diuretics again today. From repeat weights this AM patient close to  her baseline around 210 lbs. Would anticipate starting oral diuretic regimen once Cr trends back down. Nephrology is to see patient today.   Dina RichJonathan Meekah Math MD

## 2016-08-30 NOTE — Progress Notes (Signed)
Initial Nutrition Assessment   INTERVENTION:  Heart Healthy/ CHO modified diet  Recommend daily standing wts- if pt is able in order to verify the severity of wt increase  Provide CHF education prior to admission- preferably with a family member present   NUTRITION DIAGNOSIS:    (Unplanned weight gain) related to  (acute CHF) as evidenced by   36% above reported usual body wt. She has gained 37# (16.7 KG) in  7 weeks.    GOAL:    (Patient with achieve stable dry wt and meet >90% est nutr needs.)   MONITOR:   Weight trends, I & O's, Labs, Skin  REASON FOR ASSESSMENT:   LOS    ASSESSMENT: The patient has a hx of DM2, CKD-3, CHF. [facility] breath and was diagnosed with acute CHF. She has been diuresed 13+liters per cardiology. Lasix is held for now due to increasing Cr.   Ms Debbra RidingConnally is up in her chair and lunch tray is here. Intake; po's 50-100% through last night's meal according to documentation. Lunch today observed by RD very poor intake 25% (she ate her jello and drank tea). She is not forthcoming with information even with simple questions.  Her weight hx is somewhat of mystery. Records show a 20# increase from March to June and additional gain of 37# up to today's wt. Question if it may be useful to obtain standing wt if pt is able to tolerate.   Meds: SSI, Protonix  Recent Labs Lab 08/28/16 0622 08/29/16 0755 08/30/16 0602  NA 140 138 136  K 4.2 4.2 4.1  CL 101 100* 97*  CO2 29 28 28   BUN 70* 68* 65*  CREATININE 2.04* 2.25* 2.41*  CALCIUM 8.1* 8.2* 8.2*  GLUCOSE 156* 168* 182*   Labs reviewed: Cr 2.41- trending up. CBG (last 3)   Recent Labs  08/30/16 0549 08/30/16 0749 08/30/16 1124  GLUCAP 185* 202* 336*  CBG's: 185-336 today.    Diet Order:  Diet heart healthy/carb modified Room service appropriate? Yes; Fluid consistency: Thin  Skin:   (Open blister right wrist)  Last BM:  7/23 laxative given- lg soft  stool  Height:   Ht Readings from Last 1 Encounters:  08/19/16 5\' 4"  (1.626 m)    Weight:   Wt Readings from Last 1 Encounters:  08/30/16 212 lb 8 oz (96.4 kg)    Ideal Body Weight:  55 kg  BMI:  Body mass index is 36.48 kg/m.  Estimated Nutritional Needs:   Kcal:  1832-2015 kcal (Adj wt 91.6 kg)  Protein:  73-82 kg  Fluid:  1.8-2.0 liters daily  EDUCATION NEEDS:   Education needs no appropriate at this time (question pt ability to fully participate in education given her minimal interaction with RD. Recommend provide edu when family members are present to maximize compliance.)  Royann ShiversLynn Clifton Kovacic MS,RD,CSG,LDN Office: [phone] Pager: [phone]

## 2016-08-30 NOTE — Progress Notes (Signed)
Pt has an open blister on her R hand from rubbing of her splint on arm. Foam dressing placed over the open blister. Daughter stated she was going to talk to Dr. [name] in the am about removing splint. Will continue to monitor. Pt refused once again to turn, no skin breakdown noted on pts bottom.  Pt re-educated about importance of preventing skin breakdown. Pt verbalized understanding.

## 2016-08-30 NOTE — Consult Note (Signed)
Reason for Consult: Chronic renal failure Referring Physician: Dr. Lily [name] is an 83 y.o. female.  HPI: She is a patient was history of diabetes, hypertension, atrial fibrillation and chronic renal failure presently came with complaints of difficulty breathing, orthopnea. When she was evaluated she was found to have recurrence of her congestive heart failure and patient was started on IV Lasix. Presently patient is feeling much better however her renal function continued to decline hence consult is called. Presently patient denies any nausea or vomiting. Patient had previous history of renal failure was not kidney stone. She is able to lie down without any difficulty the last 24 hours.  Past Medical History:  Diagnosis Date  . Atrial fibrillation ([place])   . CAD (coronary artery disease)    Multivessel status post high risk PCI/DES to circumflex 11/2015 (poor candidate for CABG)  . CHF (congestive heart failure) ([place])   . CKD (chronic kidney disease) stage 3, GFR 30-59 ml/min   . DDD (degenerative disc disease), lumbar   . Essential hypertension, benign   . Mixed hyperlipidemia   . Type 2 diabetes mellitus ([place])     Past Surgical History:  Procedure Laterality Date  . CARDIAC CATHETERIZATION N/A 11/26/2015   Procedure: Right/Left Heart Cath and Coronary Angiography;  Surgeon: Nelva Bush, MD;  Location: [facility];  Service: Cardiovascular;  Laterality: N/A;  . CARDIAC CATHETERIZATION N/A 12/01/2015   Procedure: Coronary Stent Intervention Rotoblader;  Surgeon: Sherren Mocha, MD;  Location: [facility];  Service: Cardiovascular;  Laterality: N/A;  . TOTAL HIP ARTHROPLASTY  01/14/03   Left    Family History  Problem Relation Age of Onset  . Diabetes type II Mother   . Diabetes type II Father   . Hypertension Father     Social History:  reports that she has never smoked. She has never used smokeless tobacco. She reports that she does not drink  alcohol or use drugs.  Allergies: No Known Allergies  Medications: I have reviewed the patient'[name] current medications.  Results for orders placed or performed during the hospital encounter of 08/18/16 (from the past 48 hour([name]))  Glucose, capillary     Status: Abnormal   Collection Time: 08/28/16  4:28 PM  Result Value Ref Range   Glucose-Capillary 165 (H) 65 - 99 mg/dL  Glucose, capillary     Status: Abnormal   Collection Time: 08/28/16  9:11 PM  Result Value Ref Range   Glucose-Capillary 164 (H) 65 - 99 mg/dL  Glucose, capillary     Status: Abnormal   Collection Time: 08/29/16  7:32 AM  Result Value Ref Range   Glucose-Capillary 167 (H) 65 - 99 mg/dL   Comment 1 Notify RN    Comment 2 Document in Chart   Protime-INR     Status: Abnormal   Collection Time: 08/29/16  7:55 AM  Result Value Ref Range   Prothrombin Time 19.5 (H) 11.4 - 15.2 seconds   INR 1.63   CBC     Status: Abnormal   Collection Time: 08/29/16  7:55 AM  Result Value Ref Range   WBC 10.6 (H) 4.0 - 10.5 K/uL   RBC 2.93 (L) 3.87 - 5.11 MIL/uL   Hemoglobin 9.1 (L) 12.0 - 15.0 g/dL   HCT 27.7 (L) 36.0 - 46.0 %   MCV 94.5 78.0 - 100.0 fL   MCH 31.1 26.0 - 34.0 pg   MCHC 32.9 30.0 - 36.0 g/dL   RDW 15.3 11.5 -  15.5 %   Platelets 250 150 - 400 K/uL  Basic metabolic panel     Status: Abnormal   Collection Time: 08/29/16  7:55 AM  Result Value Ref Range   Sodium 138 135 - 145 mmol/L   Potassium 4.2 3.5 - 5.1 mmol/L   Chloride 100 (L) 101 - 111 mmol/L   CO2 28 22 - 32 mmol/L   Glucose, Bld 168 (H) 65 - 99 mg/dL   BUN 68 (H) 6 - 20 mg/dL   Creatinine, Ser 2.25 (H) 0.44 - 1.00 mg/dL   Calcium 8.2 (L) 8.9 - 10.3 mg/dL   GFR calc non Af Amer 19 (L) >60 mL/min   GFR calc Af Amer 22 (L) >60 mL/min    Comment: (NOTE) The eGFR has been calculated using the CKD EPI equation. This calculation has not been validated in all clinical situations. eGFR'[name] persistently <60 mL/min signify possible Chronic Kidney Disease.     Anion gap 10 5 - 15  Glucose, capillary     Status: Abnormal   Collection Time: 08/29/16 11:34 AM  Result Value Ref Range   Glucose-Capillary 195 (H) 65 - 99 mg/dL   Comment 1 Notify RN    Comment 2 Document in Chart   Glucose, capillary     Status: Abnormal   Collection Time: 08/29/16  4:52 PM  Result Value Ref Range   Glucose-Capillary 103 (H) 65 - 99 mg/dL   Comment 1 Notify RN    Comment 2 Document in Chart   Glucose, capillary     Status: Abnormal   Collection Time: 08/29/16  9:08 PM  Result Value Ref Range   Glucose-Capillary 106 (H) 65 - 99 mg/dL  Glucose, capillary     Status: Abnormal   Collection Time: 08/30/16  5:49 AM  Result Value Ref Range   Glucose-Capillary 185 (H) 65 - 99 mg/dL  Protime-INR     Status: Abnormal   Collection Time: 08/30/16  6:02 AM  Result Value Ref Range   Prothrombin Time 19.3 (H) 11.4 - 15.2 seconds   INR 2.54   Basic metabolic panel     Status: Abnormal   Collection Time: 08/30/16  6:02 AM  Result Value Ref Range   Sodium 136 135 - 145 mmol/L   Potassium 4.1 3.5 - 5.1 mmol/L   Chloride 97 (L) 101 - 111 mmol/L   CO2 28 22 - 32 mmol/L   Glucose, Bld 182 (H) 65 - 99 mg/dL   BUN 65 (H) 6 - 20 mg/dL   Creatinine, Ser 2.41 (H) 0.44 - 1.00 mg/dL   Calcium 8.2 (L) 8.9 - 10.3 mg/dL   GFR calc non Af Amer 17 (L) >60 mL/min   GFR calc Af Amer 20 (L) >60 mL/min    Comment: (NOTE) The eGFR has been calculated using the CKD EPI equation. This calculation has not been validated in all clinical situations. eGFR'[name] persistently <60 mL/min signify possible Chronic Kidney Disease.    Anion gap 11 5 - 15  Glucose, capillary     Status: Abnormal   Collection Time: 08/30/16  7:49 AM  Result Value Ref Range   Glucose-Capillary 202 (H) 65 - 99 mg/dL  Glucose, capillary     Status: Abnormal   Collection Time: 08/30/16 11:24 AM  Result Value Ref Range   Glucose-Capillary 336 (H) 65 - 99 mg/dL    Dg Wrist Complete Right  Result Date:  08/30/2016 CLINICAL DATA:  Right wrist pain. EXAM: RIGHT WRIST - COMPLETE  3+ VIEW COMPARISON:  07/20/2016 FINDINGS: The patient has arthritis between the lunate, triquetrum, and hamate with cystic degenerative changes and sclerosis in the proximal pole of the hamate. There is slight irregularity of the triquetrum without a discrete fracture. There is chondrocalcinosis at the radiocarpal joint. There is moderate arthritis of the first carpometacarpal joint. IMPRESSION: 1. No acute abnormality. 2. Arthritic changes in the midcarpal joint. 3. Chondrocalcinosis of the radiocarpal joint. 4. Osteoarthritis of the first carpometacarpal joint. 5. No discrete fracture of the triquetrum. Electronically Signed   By: Lorriane Shire M.D.   On: 08/30/2016 09:27    Review of Systems  Constitutional: Negative for chills and fever.  Respiratory: Negative for cough, sputum production and shortness of breath.   Cardiovascular: Positive for chest pain and leg swelling. Negative for orthopnea.  Gastrointestinal: Negative for abdominal pain, heartburn and nausea.   Blood pressure 135/68, pulse 71, temperature 98.9 F (37.2 C), temperature source Oral, resp. rate 19, height '5\' 4"'$  (1.626 m), weight 96.4 kg (212 lb 8 oz), SpO2 99 %. Physical Exam  Eyes: No scleral icterus.  Neck: JVD present.  Cardiovascular: Normal rate and regular rhythm.   Respiratory: No respiratory distress. She has no wheezes. She has rales.  GI: She exhibits distension. There is no tenderness.  Musculoskeletal: She exhibits no edema.  Neurological: She is alert.    Assessment/Plan: Problem #1 chronic renal failure: Her creatinine was 0.97 on 07/13/2014                                                                                      1.37 on 02/17/2015                                                                                      2.27 on 12/16/15                                                                                      1.82 on  12/24/15 Her renal function has been fluctuating for some time possibly related to fluid removal. Presently her creatinine has increased most likely from diuretics which has been discontinued. Patient has this moment doesn't have any uremic signs and  Symptoms. Patient with stage IV chronic renal failure. Etiology could be ischemic/diabetes/hypertension. Problem #2 history of recurrent CHF: Presently patient is feeling much better. Patient seems to have been diagnosed. Even tough she still  has some leg edema. Problem #3 hypertension: Her blood pressure is reasonably controlled Problem #4  history of atrial fibrillation: Heart rate is controlled Problem #5 history of diabetes Problem #6 history of three-vessel coronary artery disease. Being followed by cardiology. Problem #7 anemia: Her hemoglobin is below target goal Plan: 1] Will do ultrasound of the kidneys 2] we'll check renal panel, intact PTH, vitamin D in the morning. 3] Agree with holding diuretics for now.   Melissa [name] 08/30/2016, 2:31 PM

## 2016-08-30 NOTE — Clinical Social Work Placement (Signed)
   CLINICAL SOCIAL WORK PLACEMENT  NOTE  Date:  08/30/2016  Patient Details  Name: Carley HammedBettie L Hennington MRN: [id number] Date of Birth: May 02, 1930  Clinical Social Work is seeking post-discharge placement for this patient at the Skilled  Nursing Facility level of care (*CSW will initial, date and re-position this form in  chart as items are completed):  Yes   Patient/family provided with [facility] Clinical Social Work Department's list of facilities offering this level of care within the geographic area requested by the patient (or if unable, by the patient's family).  Yes   Patient/family informed of their freedom to choose among providers that offer the needed level of care, that participate in Medicare, Medicaid or managed care program needed by the patient, have an available bed and are willing to accept the patient.  Yes   Patient/family informed of [facility]'s ownership interest in [facility] and [facility], as well as of the fact that they are under no obligation to receive care at these facilities.  PASRR submitted to EDS on 08/30/16     PASRR number received on 08/30/16     Existing PASRR number confirmed on       FL2 transmitted to all facilities in geographic area requested by pt/family on 08/30/16     FL2 transmitted to all facilities within larger geographic area on       Patient informed that his/her managed care company has contracts with or will negotiate with certain facilities, including the following:            Patient/family informed of bed offers received.  Patient chooses bed at       Physician recommends and patient chooses bed at      Patient to be transferred to   on  .  Patient to be transferred to facility by       Patient family notified on   of transfer.  Name of family member notified:        PHYSICIAN Please sign FL2     Additional Comment:    _______________________________________________ Raye Sorrowoble, Alayah Knouff N, LCSW 08/30/2016,  3:41 PM

## 2016-08-30 NOTE — Progress Notes (Signed)
Pt felt warm to touch during bath, temperature checked=98.6. Blood sugar checked d/t pts sugar dropping the past couple of mornings=185. Will continue to monitor pt

## 2016-08-30 NOTE — Clinical Social Work Note (Signed)
Clinical Social Work Assessment  Patient Details  Name: Melissa [name] MRN: [id number] Date of Birth: 04-23-1930  Date of referral:  08/30/16               Reason for consult:  Facility Placement                Permission sought to share information with:  Case Manager, Magazine features editoracility Contact Representative, Family Supports Permission granted to share information::  Yes, Verbal Permission Granted  Name::        Agency::     Relationship::  Daughter Pension scheme managerhirley  Contact Information:     Housing/Transportation Living arrangements for the past 2 months:  Single Family Home Source of Information:  Medical Team, Case Manager, Adult Children Patient Interpreter Needed:  None Criminal Activity/Legal Involvement Pertinent to Current Situation/Hospitalization:  No - Comment as needed Significant Relationships:  Adult Children, Other Family Members Lives with:  Adult Children (lives with daughter) Do you feel safe going back to the place where you live?  No Need for family participation in patient care:  Yes (Comment)  Care giving concerns:  Patient admitted to hospital due to SOB and leg swelling.  Patient continues to meet acute care needs as treatment team is addressing her kidney function and chronic CHF.  Patient lives with her daughter who is her care giver.  Daughter contacted and discussed recommendations from MD and PT regarding ST SNF placement. Daughter agreeable reporting she only wants it short term and she and [name] have discussed placement in the local area if possible.  Feels patient needs to be stronger before returning home to daughters care.  Social Worker assessment / plan:  Assessment completed for placement. SNF work up completed. Daughter reports she would like placement to be local [facility] if possible.  L[facility].  Plan: SNF at [place]  Employment status:  Retired Database administratornsurance information:  Managed Medicare PT Recommendations:   Skilled Nursing Facility Information / Referral to community resources:  Skilled Nursing Facility  Patient/Family's Response to care:  Agreeable  Patient/Family's Understanding of and Emotional Response to Diagnosis, Current Treatment, and Prognosis:  Daughter reports she is very involved in care and comes to see patient after work. Agreeable with recommendations for ST placement. Able to give history of patient current treatment plans associated with kidney function.  Emotional Assessment Appearance:  Appears stated age Attitude/Demeanor/Rapport:    Affect (typically observed):  Accepting, Adaptable Orientation:  Oriented to Self, Oriented to Place, Oriented to Situation Alcohol / Substance use:  Not Applicable Psych involvement (Current and /or in the community):  No (Comment)  Discharge Needs  Concerns to be addressed:  No discharge needs identified Readmission within the last 30 days:  No Current discharge risk:  None Barriers to Discharge:  No Barriers Identified   Raye SorrowCoble, Dartha Rozzell N, LCSW 08/30/2016, 3:42 PM

## 2016-08-30 NOTE — Progress Notes (Signed)
Subjective: She says she feels okay. She's concerned that the brace on her right wrist has rubbed this lesion on her skin. She has been off diuretics now for about 48 hours but her renal function continues to deteriorate. No chest pain.  Objective: Vital signs in last 24 hours: Temp:  [98.5 F (36.9 C)-99.3 F (37.4 C)] 98.5 F (36.9 C) (07/24 0609) Pulse Rate:  [73-83] 76 (07/24 0410) Resp:  [16-20] 16 (07/24 0410) BP: (121-147)/(63-96) 147/76 (07/24 0410) SpO2:  [95 %-100 %] 96 % (07/24 0721) Weight:  [97.7 kg (215 lb 6.2 oz)] 97.7 kg (215 lb 6.2 oz) (07/24 0257) Weight change: -0.3 kg (-10.6 oz) Last BM Date: 08/29/16  Intake/Output from previous day: 07/23 0701 - 07/24 0700 In: 840 [P.O.:840] Out: 500 [Urine:500]  PHYSICAL EXAM General appearance: alert, cooperative and mild distress Resp: clear to auscultation bilaterally Cardio: irregularly irregular rhythm GI: soft, non-tender; bowel sounds normal; no masses,  no organomegaly Extremities: She still has some edema but it's better. She has a brace on her right wrist. Very hard of hearing  Lab Results:  Results for orders placed or performed during the hospital encounter of 08/18/16 (from the past 48 hour(s))  Glucose, capillary     Status: Abnormal   Collection Time: 08/28/16 11:30 AM  Result Value Ref Range   Glucose-Capillary 140 (H) 65 - 99 mg/dL  Glucose, capillary     Status: Abnormal   Collection Time: 08/28/16  4:28 PM  Result Value Ref Range   Glucose-Capillary 165 (H) 65 - 99 mg/dL  Glucose, capillary     Status: Abnormal   Collection Time: 08/28/16  9:11 PM  Result Value Ref Range   Glucose-Capillary 164 (H) 65 - 99 mg/dL  Glucose, capillary     Status: Abnormal   Collection Time: 08/29/16  7:32 AM  Result Value Ref Range   Glucose-Capillary 167 (H) 65 - 99 mg/dL   Comment 1 Notify RN    Comment 2 Document in Chart   Protime-INR     Status: Abnormal   Collection Time: 08/29/16  7:55 AM  Result Value  Ref Range   Prothrombin Time 19.5 (H) 11.4 - 15.2 seconds   INR 1.63   CBC     Status: Abnormal   Collection Time: 08/29/16  7:55 AM  Result Value Ref Range   WBC 10.6 (H) 4.0 - 10.5 K/uL   RBC 2.93 (L) 3.87 - 5.11 MIL/uL   Hemoglobin 9.1 (L) 12.0 - 15.0 g/dL   HCT 27.7 (L) 36.0 - 46.0 %   MCV 94.5 78.0 - 100.0 fL   MCH 31.1 26.0 - 34.0 pg   MCHC 32.9 30.0 - 36.0 g/dL   RDW 15.3 11.5 - 15.5 %   Platelets 250 150 - 400 K/uL  Basic metabolic panel     Status: Abnormal   Collection Time: 08/29/16  7:55 AM  Result Value Ref Range   Sodium 138 135 - 145 mmol/L   Potassium 4.2 3.5 - 5.1 mmol/L   Chloride 100 (L) 101 - 111 mmol/L   CO2 28 22 - 32 mmol/L   Glucose, Bld 168 (H) 65 - 99 mg/dL   BUN 68 (H) 6 - 20 mg/dL   Creatinine, Ser 2.25 (H) 0.44 - 1.00 mg/dL   Calcium 8.2 (L) 8.9 - 10.3 mg/dL   GFR calc non Af Amer 19 (L) >60 mL/min   GFR calc Af Amer 22 (L) >60 mL/min    Comment: (NOTE) The eGFR  has been calculated using the CKD EPI equation. This calculation has not been validated in all clinical situations. eGFR's persistently <60 mL/min signify possible Chronic Kidney Disease.    Anion gap 10 5 - 15  Glucose, capillary     Status: Abnormal   Collection Time: 08/29/16 11:34 AM  Result Value Ref Range   Glucose-Capillary 195 (H) 65 - 99 mg/dL   Comment 1 Notify RN    Comment 2 Document in Chart   Glucose, capillary     Status: Abnormal   Collection Time: 08/29/16  4:52 PM  Result Value Ref Range   Glucose-Capillary 103 (H) 65 - 99 mg/dL   Comment 1 Notify RN    Comment 2 Document in Chart   Glucose, capillary     Status: Abnormal   Collection Time: 08/29/16  9:08 PM  Result Value Ref Range   Glucose-Capillary 106 (H) 65 - 99 mg/dL  Glucose, capillary     Status: Abnormal   Collection Time: 08/30/16  5:49 AM  Result Value Ref Range   Glucose-Capillary 185 (H) 65 - 99 mg/dL  Protime-INR     Status: Abnormal   Collection Time: 08/30/16  6:02 AM  Result Value Ref  Range   Prothrombin Time 19.3 (H) 11.4 - 15.2 seconds   INR 7.90   Basic metabolic panel     Status: Abnormal   Collection Time: 08/30/16  6:02 AM  Result Value Ref Range   Sodium 136 135 - 145 mmol/L   Potassium 4.1 3.5 - 5.1 mmol/L   Chloride 97 (L) 101 - 111 mmol/L   CO2 28 22 - 32 mmol/L   Glucose, Bld 182 (H) 65 - 99 mg/dL   BUN 65 (H) 6 - 20 mg/dL   Creatinine, Ser 2.41 (H) 0.44 - 1.00 mg/dL   Calcium 8.2 (L) 8.9 - 10.3 mg/dL   GFR calc non Af Amer 17 (L) >60 mL/min   GFR calc Af Amer 20 (L) >60 mL/min    Comment: (NOTE) The eGFR has been calculated using the CKD EPI equation. This calculation has not been validated in all clinical situations. eGFR's persistently <60 mL/min signify possible Chronic Kidney Disease.    Anion gap 11 5 - 15  Glucose, capillary     Status: Abnormal   Collection Time: 08/30/16  7:49 AM  Result Value Ref Range   Glucose-Capillary 202 (H) 65 - 99 mg/dL    ABGS No results for input(s): PHART, PO2ART, TCO2, HCO3 in the last 72 hours.  Invalid input(s): PCO2 CULTURES Recent Results (from the past 240 hour(s))  Urine Culture     Status: Abnormal   Collection Time: 08/26/16 10:43 AM  Result Value Ref Range Status   Specimen Description URINE, CATHETERIZED  Final   Special Requests NONE  Final   Culture (A)  Final    >=100,000 COLONIES/mL KLEBSIELLA PNEUMONIAE 50,000 COLONIES/mL GROUP B STREP(S.AGALACTIAE)ISOLATED TESTING AGAINST S. AGALACTIAE NOT ROUTINELY PERFORMED DUE TO PREDICTABILITY OF AMP/PEN/VAN SUSCEPTIBILITY. Performed at [facility], [place] [address], [place], [place] [address]    Report Status 08/28/2016 FINAL  Final   Organism ID, Bacteria KLEBSIELLA PNEUMONIAE (A)  Final      Susceptibility   Klebsiella pneumoniae - MIC*    AMPICILLIN RESISTANT Resistant     CEFAZOLIN <=4 SENSITIVE Sensitive     CEFTRIAXONE <=1 SENSITIVE Sensitive     CIPROFLOXACIN <=0.25 SENSITIVE Sensitive     GENTAMICIN <=1 SENSITIVE Sensitive      IMIPENEM <=  0.25 SENSITIVE Sensitive     NITROFURANTOIN 32 SENSITIVE Sensitive     TRIMETH/SULFA <=20 SENSITIVE Sensitive     AMPICILLIN/SULBACTAM 4 SENSITIVE Sensitive     PIP/TAZO <=4 SENSITIVE Sensitive     Extended ESBL NEGATIVE Sensitive     * >=100,000 COLONIES/mL KLEBSIELLA PNEUMONIAE   Studies/Results: No results found.  Medications:  Prior to Admission:  Prescriptions Prior to Admission  Medication Sig Dispense Refill Last Dose  . acetaminophen (TYLENOL) 325 MG tablet Take 2 tablets (650 mg total) by mouth every 4 (four) hours as needed for mild pain, moderate pain, fever or headache.   unknown  . ALPRAZolam (XANAX) 0.25 MG tablet Take 0.25 mg by mouth 3 (three) times daily as needed.   unknown  . amLODipine (NORVASC) 2.5 MG tablet TAKE (1) TABLET BY MOUTH DAILY. 30 tablet 6 08/18/2016 at Unknown time  . atorvastatin (LIPITOR) 40 MG tablet Take 1 tablet (40 mg total) by mouth daily. 90 tablet 3 08/18/2016 at Unknown time  . clopidogrel (PLAVIX) 75 MG tablet Take 1 tablet (75 mg total) by mouth daily. 90 tablet 3 08/18/2016 at Unknown time  . glimepiride (AMARYL) 2 MG tablet Take 2 mg by mouth daily.     08/18/2016 at Unknown time  . hydrALAZINE (APRESOLINE) 100 MG tablet Take 1 tablet (100 mg total) by mouth 3 (three) times daily. 270 tablet 3 08/18/2016 at Unknown time  . HYDROcodone-acetaminophen (NORCO/VICODIN) 5-325 MG tablet Take 1 tablet by mouth 4 (four) times daily.    08/18/2016 at Unknown time  . insulin detemir (LEVEMIR) 100 UNIT/ML injection Inject 0.2 mLs (20 Units total) into the skin at bedtime. 10 mL 11 08/17/2016 at Unknown time  . insulin lispro (HUMALOG) 100 UNIT/ML injection Inject 0-0.15 mLs (0-15 Units total) into the skin 3 (three) times daily with meals. Use as sliding scale 10 mL 11 08/18/2016 at Unknown time  . isosorbide mononitrate (IMDUR) 60 MG 24 hr tablet Take 1 tablet (60 mg total) by mouth daily. 90 tablet 3 08/18/2016 at Unknown time  . nitroGLYCERIN  (NITROSTAT) 0.4 MG SL tablet Place 1 tablet (0.4 mg total) under the tongue every 5 (five) minutes as needed for chest pain. 25 tablet 2 unknown  . pantoprazole (PROTONIX) 40 MG tablet Take 1 tablet (40 mg total) by mouth daily at 6 (six) AM. 90 tablet 3 08/18/2016 at Unknown time  . PROAIR HFA 108 (90 Base) MCG/ACT inhaler Inhale 1-2 puffs into the lungs 4 (four) times daily.    08/18/2016 at Unknown time  . torsemide (DEMADEX) 20 MG tablet Take 2 tablets (40 mg total) by mouth 2 (two) times daily. 120 tablet 6 08/18/2016 at Unknown time  . TRAVATAN Z 0.004 % SOLN ophthalmic solution Place 1 drop into both eyes at bedtime.    08/17/2016 at Unknown time  . umeclidinium bromide (INCRUSE ELLIPTA) 62.5 MCG/INH AEPB Inhale 1 puff into the lungs daily.   08/18/2016 at Unknown time  . warfarin (COUMADIN) 5 MG tablet TAKE 1 TABLET BY MOUTH DAILY EXCEPT 1/2 TABLET ON WEDNESDAYS AND SATURDAYS. 30 tablet 6 08/17/2016 at 2130   Scheduled: . amLODipine  10 mg Oral Daily  . atorvastatin  40 mg Oral q1800  . clopidogrel  75 mg Oral Daily  . hydrALAZINE  100 mg Oral TID  . insulin aspart  0-15 Units Subcutaneous TID WC  . insulin aspart  0-5 Units Subcutaneous QHS  . insulin aspart  4 Units Subcutaneous TID WC  . ipratropium-albuterol  3 mL Nebulization BID  . isosorbide mononitrate  90 mg Oral Daily  . latanoprost  1 drop Both Eyes QHS  . pantoprazole  40 mg Oral Q0600  . primidone  50 mg Oral Daily  . sodium chloride flush  3 mL Intravenous Q12H  . Warfarin - Pharmacist Dosing Inpatient   Does not apply Q24H   Continuous: . sodium chloride     TVD:FPBHEB chloride, acetaminophen, albuterol, ALPRAZolam, bisacodyl, bisacodyl, magnesium hydroxide, metoprolol tartrate, morphine injection, ondansetron (ZOFRAN) IV, sodium chloride flush  Assesment: She has acute on chronic diastolic heart failure. She has chronic kidney disease with acute kidney injury now. She had chest pain on admission but that has resolved.  She has diabetes which is pretty well controlled. She has persistent atrial fib which is stable. She had a fracture of her wrist. Principal Problem:   Acute on chronic diastolic CHF (congestive heart failure) (HCC) Active Problems:   Essential hypertension, benign   Persistent atrial fibrillation ([place])   Long term current use of anticoagulant   Chronic kidney disease (CKD), stage IV (severe) (HCC)   Status post coronary artery stent placement   Diabetes mellitus type 2, controlled ([place])   Acute on chronic diastolic (congestive) heart failure ([place])    Plan: She has been set up to need skilled care facility rehabilitation. However with her renal function giving her more trouble I'm going to go ahead and request nephrology consultation. Repeat blood tomorrow. Remain off diuretics. X-ray her wrist and discontinue the splint    LOS: 11 days   Melissa [name] L 08/30/2016, 8:51 AM

## 2016-08-30 NOTE — Care Management Note (Signed)
Case Management Note  Patient Details  Name: Carley HammedBettie L Clevinger MRN: [id number] Date of Birth: [birth year]  If discussed at Long Length of Stay Meetings, dates discussed:  08/30/2016   Additional Comments: Will need daily weights and contingency plan for lasix at SNF if weight increases.  Naje Rice, Chrystine OilerSharley Diane, RN 08/30/2016, 10:37 AM

## 2016-08-30 NOTE — Plan of Care (Signed)
Problem: Safety: Goal: Ability to remain free from injury will improve Outcome: Progressing Pt continues to be on High fall risk with fall mat underneath. Pt on bleeding risk d/t being on long term coumadin. Pts bed in lowest locked position, SR up x3, call bell within reach. Pt up with PT to chair today. Alert and oriented x4.

## 2016-08-30 NOTE — NC FL2 (Signed)
[place] MEDICAID FL2 LEVEL OF CARE SCREENING TOOL     IDENTIFICATION  Patient Name: Melissa [name] Birthdate: [birth year] Sex: female Admission Date (Current Location): 08/18/2016  [facility] and [place] Number:  [employer] and Address:  [facility],  [address], [place] [address]      Provider Number: [id number]  Attending Physician Name and Address:  Kari Baars, MD  Relative Name and Phone Number:  Daughter Vicente Serene  [phone]    Current Level of Care: Hospital Recommended Level of Care: Skilled Nursing Facility Prior Approval Number:    Date Approved/Denied:   PASRR Number: [id number]  Discharge Plan: SNF    Current Diagnoses: Patient Active Problem List   Diagnosis Date Noted  . Acute on chronic diastolic (congestive) heart failure (HCC) 08/19/2016  . Hand fracture, right 07/21/2016  . Diabetes mellitus type 2, controlled (HCC) 07/20/2016  . Volume depletion 07/20/2016  . Myoclonic jerking 07/20/2016  . Hyperosmolarity syndrome 07/20/2016  . Status post coronary artery stent placement   . Anemia of chronic disease 12/03/2015  . Bradycardia 12/01/2015  . CAD S/P high risk PCI 12/01/15 12/01/2015  . NSTEMI (non-ST elevated myocardial infarction) (HCC)   . Pulmonary hypertension (HCC)   . Chronic kidney disease (CKD), stage IV (severe) (HCC) 11/24/2015  . Unstable angina (HCC) 11/22/2015  . Chest pain 11/10/2015  . Angina at rest [facility]) 11/10/2015  . Acute CHF (congestive heart failure) (HCC) 11/10/2015  . Chronic venous insufficiency 02/19/2015  . Acute on chronic diastolic CHF (congestive heart failure) (HCC) 02/17/2015  . Tricuspid valve regurgitation 02/17/2015  . Encounter for therapeutic drug monitoring 03/11/2013  . Long term current use of anticoagulant 05/05/2010  . Mixed hyperlipidemia 09/30/2009  . Essential hypertension, benign 09/24/2008  . Persistent atrial fibrillation (HCC) 09/24/2008     Orientation RESPIRATION BLADDER Height & Weight     Situation, Place  O2 (1L) Incontinent, External catheter Weight: 212 lb 8 oz (96.4 kg) Height:  5\' 4"  (162.6 cm)  BEHAVIORAL SYMPTOMS/MOOD NEUROLOGICAL BOWEL NUTRITION STATUS      Continent Diet (See DC summary)  Diet heart healthy/carb modified   AMBULATORY STATUS COMMUNICATION OF NEEDS Skin   Extensive Assist Verbally Other (Comment)                       Personal Care Assistance Level of Assistance  Bathing, Feeding, Dressing Bathing Assistance: Limited assistance Feeding assistance: Limited assistance Dressing Assistance: Limited assistance     Functional Limitations Info  Sight, Hearing, Speech Sight Info: Adequate Hearing Info: Adequate Speech Info: Adequate    SPECIAL CARE FACTORS FREQUENCY  PT (By licensed PT), OT (By licensed OT)     PT Frequency: 5x OT Frequency: 5x            Contractures Contractures Info: Not present    Additional Factors Info  Code Status, Allergies Code Status Info: Full Code Allergies Info: NKA    Insulin:    Amaryl 2 mg daily, Levemir 15 units QHS, Novolog 0-15 units TID with meals, Novolog 0-5 units QHS, Novolog 4 units TID with meals  Inpatient Diabetes Program Recommendations: Insulin - Basal: Fasting glucose 29 mg/dl this morning. Please consider decreasing Levemir to 10 units QHS. Oral Agents: Please discontinue Amaryl while inpatient. Insulin-Meal Coverage: May want to consider discontinuing meal coverage since steroids have been discontinued.       Current Medications (08/30/2016):  This is the current hospital active medication list Current Facility-Administered  Medications  Medication Dose Route Frequency Provider Last Rate Last Dose  . 0.9 %  sodium chloride infusion  250 mL Intravenous PRN Delano MetzSchertz, Robert, MD      . acetaminophen (TYLENOL) tablet 650 mg  650 mg Oral Q4H PRN Delano MetzSchertz, Robert, MD   650 mg at 08/25/16 2049  . albuterol (PROVENTIL) (2.5  MG/3ML) 0.083% nebulizer solution 2.5 mg  2.5 mg Nebulization Q4H PRN Delano MetzSchertz, Robert, MD   2.5 mg at 08/29/16 0150  . ALPRAZolam Prudy Feeler(XANAX) tablet 0.25 mg  0.25 mg Oral TID PRN Delano MetzSchertz, Robert, MD   0.25 mg at 08/30/16 0540  . amLODipine (NORVASC) tablet 10 mg  10 mg Oral Daily Jodelle GrossLawrence, Kathryn M, NP   10 mg at 08/30/16 0920  . atorvastatin (LIPITOR) tablet 40 mg  40 mg Oral q1800 Delano MetzSchertz, Robert, MD   40 mg at 08/29/16 1707  . bisacodyl (DULCOLAX) EC tablet 5 mg  5 mg Oral Daily PRN Kari BaarsHawkins, Edward, MD      . bisacodyl (DULCOLAX) suppository 10 mg  10 mg Rectal Daily PRN Kari BaarsHawkins, Edward, MD   10 mg at 08/29/16 1509  . clopidogrel (PLAVIX) tablet 75 mg  75 mg Oral Daily Delano MetzSchertz, Robert, MD   75 mg at 08/30/16 [id number]  . hydrALAZINE (APRESOLINE) tablet 100 mg  100 mg Oral TID Delano MetzSchertz, Robert, MD   100 mg at 08/30/16 0920  . insulin aspart (novoLOG) injection 0-15 Units  0-15 Units Subcutaneous TID WC Avon GullyFanta, Tesfaye, MD   11 Units at 08/30/16 1131  . insulin aspart (novoLOG) injection 0-5 Units  0-5 Units Subcutaneous QHS Avon GullyFanta, Tesfaye, MD   4 Units at 08/23/16 2119  . insulin aspart (novoLOG) injection 4 Units  4 Units Subcutaneous TID WC Kari BaarsHawkins, Edward, MD   4 Units at 08/30/16 1200  . ipratropium-albuterol (DUONEB) 0.5-2.5 (3) MG/3ML nebulizer solution 3 mL  3 mL Nebulization BID Kari BaarsHawkins, Edward, MD   3 mL at 08/30/16 0721  . isosorbide mononitrate (IMDUR) 24 hr tablet 90 mg  90 mg Oral Daily Jodelle GrossLawrence, Kathryn M, NP   90 mg at 08/30/16 0920  . latanoprost (XALATAN) 0.005 % ophthalmic solution 1 drop  1 drop Both Eyes QHS Delano MetzSchertz, Robert, MD   1 drop at 08/29/16 2133  . magnesium hydroxide (MILK OF MAGNESIA) suspension 30 mL  30 mL Oral Daily PRN Kari BaarsHawkins, Edward, MD   30 mL at 08/24/16 1038  . metoprolol tartrate (LOPRESSOR) injection 2.5 mg  2.5 mg Intravenous Q5 min PRN Kari BaarsHawkins, Edward, MD   2.5 mg at 08/19/16 0940  . morphine 2 MG/ML injection 2 mg  2 mg Intravenous Q2H PRN Kari BaarsHawkins, Edward, MD   2  mg at 08/30/16 0359  . ondansetron (ZOFRAN) injection 4 mg  4 mg Intravenous Q6H PRN Delano MetzSchertz, Robert, MD      . pantoprazole (PROTONIX) EC tablet 40 mg  40 mg Oral Q0600 Delano MetzSchertz, Robert, MD   40 mg at 08/30/16 0540  . primidone (MYSOLINE) tablet 50 mg  50 mg Oral Daily Kari BaarsHawkins, Edward, MD   50 mg at 08/30/16 [id number]  . sodium chloride flush (NS) 0.9 % injection 3 mL  3 mL Intravenous Q12H Delano MetzSchertz, Robert, MD   3 mL at 08/30/16 [id number]  . sodium chloride flush (NS) 0.9 % injection 3 mL  3 mL Intravenous PRN Delano MetzSchertz, Robert, MD   3 mL at 08/21/16 1306  . warfarin (COUMADIN) tablet 7.5 mg  7.5 mg Oral Once Kari BaarsHawkins, Edward, MD      .  Warfarin - Pharmacist Dosing Inpatient   Does not apply Q24H Kari Baars, MD         Discharge Medications: Please see discharge summary for a list of discharge medications.  Relevant Imaging Results:  Relevant Lab Results:   Additional Information SSN:  [id number]  Raye Sorrow, [place]

## 2016-08-31 DIAGNOSIS — L8990 Pressure ulcer of unspecified site, unspecified stage: Secondary | ICD-10-CM | POA: Diagnosis present

## 2016-08-31 LAB — PROTEIN, URINE, 24 HOUR
COLLECTION INTERVAL-UPROT: 24 h
PROTEIN, 24H URINE: 200 mg/d — AB (ref 50–100)
PROTEIN, URINE: 19 mg/dL
URINE TOTAL VOLUME-UPROT: 1050 mL

## 2016-08-31 LAB — C3 COMPLEMENT: C3 Complement: 108 mg/dL (ref 82–167)

## 2016-08-31 LAB — GLUCOSE, CAPILLARY
GLUCOSE-CAPILLARY: 110 mg/dL — AB (ref 65–99)
GLUCOSE-CAPILLARY: 118 mg/dL — AB (ref 65–99)
GLUCOSE-CAPILLARY: 143 mg/dL — AB (ref 65–99)
Glucose-Capillary: 99 mg/dL (ref 65–99)
Glucose-Capillary: 148 mg/dL — ABNORMAL HIGH (ref 65–99)

## 2016-08-31 LAB — BASIC METABOLIC PANEL
Anion gap: 10 (ref 5–15)
BUN: 64 mg/dL — AB (ref 6–20)
CALCIUM: 8.4 mg/dL — AB (ref 8.9–10.3)
CO2: 28 mmol/L (ref 22–32)
CREATININE: 2.33 mg/dL — AB (ref 0.44–1.00)
Chloride: 99 mmol/L — ABNORMAL LOW (ref 101–111)
GFR, EST AFRICAN AMERICAN: 21 mL/min — AB (ref >60)
GFR, EST NON AFRICAN AMERICAN: 18 mL/min — AB (ref >60)
Glucose, Bld: 108 mg/dL — ABNORMAL HIGH (ref 65–99)
Potassium: 4.1 mmol/L (ref 3.5–5.1)
SODIUM: 137 mmol/L (ref 135–145)

## 2016-08-31 LAB — C4 COMPLEMENT: Complement C4, Body Fluid: 38 mg/dL (ref 14–44)

## 2016-08-31 LAB — VITAMIN D 25 HYDROXY (VIT D DEFICIENCY, FRACTURES)

## 2016-08-31 LAB — IRON AND TIBC
Iron: 32 ug/dL (ref 28–170)
Saturation Ratios: 12 % (ref 10.4–31.8)
TIBC: 265 ug/dL (ref 250–450)
UIBC: 233 ug/dL

## 2016-08-31 LAB — PROTIME-INR
INR: 1.86
Prothrombin Time: 21.7 seconds — ABNORMAL HIGH (ref 11.4–15.2)

## 2016-08-31 LAB — COMPLEMENT, TOTAL: Compl, Total (CH50): 60 U/mL (ref >41)

## 2016-08-31 LAB — FERRITIN: FERRITIN: 88 ng/mL (ref 11–307)

## 2016-08-31 LAB — HEPATITIS B SURFACE ANTIGEN: Hepatitis B Surface Ag: NEGATIVE

## 2016-08-31 LAB — HEPATITIS C ANTIBODY

## 2016-08-31 MED ORDER — VITAMIN D (ERGOCALCIFEROL) 1.25 MG (50000 UNIT) PO CAPS
50000.0000 [IU] | ORAL_CAPSULE | ORAL | Status: DC
  Administered 2016-08-31: 50000 [IU] via ORAL
  Filled 2016-08-31 (×2): qty 1

## 2016-08-31 MED ORDER — WARFARIN SODIUM 5 MG PO TABS
7.5000 mg | ORAL_TABLET | Freq: Once | ORAL | Status: AC
  Administered 2016-08-31: 15:00:00 7.5 mg via ORAL
  Filled 2016-08-31: qty 1

## 2016-08-31 NOTE — Progress Notes (Signed)
Blood sugar rechecked just to make sure it had not dropped=110. Will continue to monitor pt

## 2016-08-31 NOTE — Plan of Care (Signed)
Problem: Safety: Goal: Ability to remain free from injury will improve Outcome: Adequate for Discharge Pt continues to be a high fall risk/bleeding risk d/t being on Coumadin. Pt up with PT, but states she is weak [facility] Pt reports history of falls. Pt educated on safety/fall prevention, verbalized understanding. Bed in lowest locked position, call bell within reach, SR up x3, fall mat in place on floor, bed alarm activated. Will continue to monitor pt

## 2016-08-31 NOTE — Progress Notes (Signed)
ANTICOAGULATION CONSULT NOTE  Pharmacy Consult for COUMADIN (home med) Indication: atrial fibrillation  No Known Allergies  Patient Measurements: Height: 5\' 4"  (162.6 cm) Weight: 212 lb 6.6 oz (96.3 kg) IBW/kg (Calculated) : 54.7  Vital Signs: Temp: 98.2 F (36.8 C) (07/25 0452) Temp Source: Oral (07/25 0452) BP: 120/61 (07/25 0452) Pulse Rate: 71 (07/25 0452)  Labs:  Recent Labs  08/29/16 0755 08/30/16 0602 08/31/16 0609  HGB 9.1*  --   --   HCT 27.7*  --   --   PLT 250  --   --   LABPROT 19.5* 19.3* 21.7*  INR 1.63 1.61 1.86  CREATININE 2.25* 2.41* 2.33*   Estimated Creatinine Clearance: 20.6 mL/min (A) (by C-G formula based on SCr of 2.33 mg/dL (H)).  Medical History: Past Medical History:  Diagnosis Date  . Atrial fibrillation (HCC)   . CAD (coronary artery disease)    Multivessel status post high risk PCI/DES to circumflex 11/2015 (poor candidate for CABG)  . CHF (congestive heart failure) (HCC)   . CKD (chronic kidney disease) stage 3, GFR 30-59 ml/min   . DDD (degenerative disc disease), lumbar   . Essential hypertension, benign   . Mixed hyperlipidemia   . Type 2 diabetes mellitus (HCC)    Medications:  Prescriptions Prior to Admission  Medication Sig Dispense Refill Last Dose  . acetaminophen (TYLENOL) 325 MG tablet Take 2 tablets (650 mg total) by mouth every 4 (four) hours as needed for mild pain, moderate pain, fever or headache.   unknown  . ALPRAZolam (XANAX) 0.25 MG tablet Take 0.25 mg by mouth 3 (three) times daily as needed.   unknown  . amLODipine (NORVASC) 2.5 MG tablet TAKE (1) TABLET BY MOUTH DAILY. 30 tablet 6 08/18/2016 at Unknown time  . atorvastatin (LIPITOR) 40 MG tablet Take 1 tablet (40 mg total) by mouth daily. 90 tablet 3 08/18/2016 at Unknown time  . clopidogrel (PLAVIX) 75 MG tablet Take 1 tablet (75 mg total) by mouth daily. 90 tablet 3 08/18/2016 at Unknown time  . glimepiride (AMARYL) 2 MG tablet Take 2 mg by mouth daily.      08/18/2016 at Unknown time  . hydrALAZINE (APRESOLINE) 100 MG tablet Take 1 tablet (100 mg total) by mouth 3 (three) times daily. 270 tablet 3 08/18/2016 at Unknown time  . HYDROcodone-acetaminophen (NORCO/VICODIN) 5-325 MG tablet Take 1 tablet by mouth 4 (four) times daily.    08/18/2016 at Unknown time  . insulin detemir (LEVEMIR) 100 UNIT/ML injection Inject 0.2 mLs (20 Units total) into the skin at bedtime. 10 mL 11 08/17/2016 at Unknown time  . insulin lispro (HUMALOG) 100 UNIT/ML injection Inject 0-0.15 mLs (0-15 Units total) into the skin 3 (three) times daily with meals. Use as sliding scale 10 mL 11 08/18/2016 at Unknown time  . isosorbide mononitrate (IMDUR) 60 MG 24 hr tablet Take 1 tablet (60 mg total) by mouth daily. 90 tablet 3 08/18/2016 at Unknown time  . nitroGLYCERIN (NITROSTAT) 0.4 MG SL tablet Place 1 tablet (0.4 mg total) under the tongue every 5 (five) minutes as needed for chest pain. 25 tablet 2 unknown  . pantoprazole (PROTONIX) 40 MG tablet Take 1 tablet (40 mg total) by mouth daily at 6 (six) AM. 90 tablet 3 08/18/2016 at Unknown time  . PROAIR HFA 108 (90 Base) MCG/ACT inhaler Inhale 1-2 puffs into the lungs 4 (four) times daily.    08/18/2016 at Unknown time  . torsemide (DEMADEX) 20 MG tablet Take 2 tablets (  40 mg total) by mouth 2 (two) times daily. 120 tablet 6 08/18/2016 at Unknown time  . TRAVATAN Z 0.004 % SOLN ophthalmic solution Place 1 drop into both eyes at bedtime.    08/17/2016 at Unknown time  . umeclidinium bromide (INCRUSE ELLIPTA) 62.5 MCG/INH AEPB Inhale 1 puff into the lungs daily.   08/18/2016 at Unknown time  . warfarin (COUMADIN) 5 MG tablet TAKE 1 TABLET BY MOUTH DAILY EXCEPT 1/2 TABLET ON WEDNESDAYS AND SATURDAYS. 30 tablet 6 08/17/2016 at 2130   Assessment: 83yo female on chronic Coumadin PTA for h/o afib.  Warfarin previously held due to elevated INR and anemia.  OK to restart per cardiology on 7/17.  INR below goal again today .    Goal of Therapy:  INR  2-3 Monitor platelets by anticoagulation protocol: Yes   Plan:  Coumadin 7.5mg  today x 1 to boost INR INR daily Monitor for signs and symptoms of bleeding.   Margo AyeHall, Chastin Riesgo A 08/31/2016,11:26 AM

## 2016-08-31 NOTE — Progress Notes (Signed)
Melissa [name]  MRN: [id number]  DOB/AGE: 1930/03/14 83 y.o.  Primary Care Physician:Hawkins, Ramon DredgeEdward, MD  Admit date: 08/18/2016  Chief Complaint:  Chief Complaint  Patient presents with  . Shortness of Breath    S-Pt presented on  08/18/2016 with  Chief Complaint  Patient presents with  . Shortness of Breath  .    Pt offers no new complaints.  Meds  . amLODipine  10 mg Oral Daily  . atorvastatin  40 mg Oral q1800  . clopidogrel  75 mg Oral Daily  . hydrALAZINE  100 mg Oral TID  . insulin aspart  0-15 Units Subcutaneous TID WC  . insulin aspart  0-5 Units Subcutaneous QHS  . insulin aspart  4 Units Subcutaneous TID WC  . ipratropium-albuterol  3 mL Nebulization BID  . isosorbide mononitrate  90 mg Oral Daily  . latanoprost  1 drop Both Eyes QHS  . pantoprazole  40 mg Oral Q0600  . primidone  50 mg Oral Daily  . sodium chloride flush  3 mL Intravenous Q12H  . Vitamin D (Ergocalciferol)  50,000 Units Oral Q7 days  . Warfarin - Pharmacist Dosing Inpatient   Does not apply Q24H      Physical Exam: Vital signs in last 24 hours: Temp:  [98.2 F (36.8 C)-98.9 F (37.2 C)] 98.2 F (36.8 C) (07/25 0452) Pulse Rate:  [68-71] 71 (07/25 0452) Resp:  [18-19] 18 (07/25 0452) BP: (120-135)/(61-71) 120/61 (07/25 0452) SpO2:  [96 %-99 %] 96 % (07/25 0804) Weight:  [212 lb 6.6 oz (96.3 kg)-212 lb 8 oz (96.4 kg)] 212 lb 6.6 oz (96.3 kg) (07/25 0452) Weight change: -2 lb 14.2 oz (-1.311 kg) Last BM Date: 08/29/16  Intake/Output from previous day: 07/24 0701 - 07/25 0700 In: 1480 [P.O.:1480] Out: 1250 [Urine:1250] No intake/output data recorded.   Physical Exam: General- pt is awake,follows commands  Resp- No acute REsp distress, decreased at bases. CVS- S1S2 regular in rate and rhythm GIT- BS+, soft, NT, ND EXT- trace LE Edema, No Cyanosis   Lab Results: CBC  Recent Labs  08/29/16 0755  WBC 10.6*  HGB 9.1*  HCT 27.7*  PLT 250    BMET  Recent Labs  08/30/16 0602 08/31/16 0609  NA 136 137  K 4.1 4.1  CL 97* 99*  CO2 28 28  GLUCOSE 182* 108*  BUN 65* 64*  CREATININE 2.41* 2.33*  CALCIUM 8.2* 8.4*   Creat trend 2018  2.2=>1.5=>2.4=>2.3 2017 1.3--2.2    MICRO Recent Results (from the past 240 hour(s))  Urine Culture     Status: Abnormal   Collection Time: 08/26/16 10:43 AM  Result Value Ref Range Status   Specimen Description URINE, CATHETERIZED  Final   Special Requests NONE  Final   Culture (A)  Final    >=100,000 COLONIES/mL KLEBSIELLA PNEUMONIAE 50,000 COLONIES/mL GROUP B STREP(S.AGALACTIAE)ISOLATED TESTING AGAINST S. AGALACTIAE NOT ROUTINELY PERFORMED DUE TO PREDICTABILITY OF AMP/PEN/VAN SUSCEPTIBILITY. Performed at [facility], [address] [place], [place] [address]    Report Status 08/28/2016 FINAL  Final   Organism ID, Bacteria KLEBSIELLA PNEUMONIAE (A)  Final      Susceptibility   Klebsiella pneumoniae - MIC*    AMPICILLIN RESISTANT Resistant     CEFAZOLIN <=4 SENSITIVE Sensitive     CEFTRIAXONE <=1 SENSITIVE Sensitive     CIPROFLOXACIN <=0.25 SENSITIVE Sensitive     GENTAMICIN <=1 SENSITIVE Sensitive     IMIPENEM <=0.25 SENSITIVE Sensitive     NITROFURANTOIN  32 SENSITIVE Sensitive     TRIMETH/SULFA <=20 SENSITIVE Sensitive     AMPICILLIN/SULBACTAM 4 SENSITIVE Sensitive     PIP/TAZO <=4 SENSITIVE Sensitive     Extended ESBL NEGATIVE Sensitive     * >=100,000 COLONIES/mL KLEBSIELLA PNEUMONIAE      Lab Results  Component Value Date   CALCIUM 8.4 (L) 08/31/2016   CAION 1.14 09/28/2015      2D echo  - Left ventricle: The cavity size was normal. Wall thickness was   increased in a pattern of moderate LVH. Systolic function was   normal. The estimated ejection fraction was in the range of 60%   to 65%. - Aortic valve: Moderately calcified annulus. Trileaflet;   moderately thickened leaflets. Valve area (VTI): 2.11 cm^2. Valve   area (Vmax): 1.68 cm^2. Valve area (Vmean): 1.61 cm^2. -  Mitral valve: There was mild regurgitation. - Left atrium: The atrium was massively dilated. - Right ventricle: The ventricular septum is flattened in systole   and diastole suggesting RV pressure and volume overload. The   cavity size was moderately dilated. - Right atrium: The atrium was massively dilated. - Atrial septum: No defect or patent foramen ovale was identified. - Tricuspid valve: There was moderate-severe regurgitation. There   is hepatic systolic flow reversal sugesting severe TR. - Pulmonary arteries: Systolic pressure was severely increased. PA   peak pressure: 77 mm Hg (S).    Vitamin D less than 4     Impression: 1)Renal  AKI secondary to Prerenal/ATN                AKI on CKD               CKD stage 3 .               CKD since 2017               CKD secondary to Multiple AKI/Age asso decline                Progression of CKD marked with AKI                       2)HTN  Medication- On Calcium Channel Blockers On Vasodilators.  3)Anemia HGb at goal (9--11)   4)CKD Mineral-Bone Disorder Vitamin D def- on replacement Phosphorus at goal.   5)CVs-admitted with CHF Severe Pul HTN Pt negative by 10+ liters Primary MD and Cardiology following Diurtics now on hold  6)Electrolytes Normokalemic NOrmonatremic   7)Acid base Co2 at goal     Plan:  Will continue current care for now Will reassess in am for need of diuretics      Andersson Larrabee S 08/31/2016, 9:24 AM

## 2016-08-31 NOTE — Clinical Social Work Note (Signed)
Patient left a message for patient's daughter requesting return contact in regards to bed offers.   Davine Coba, Juleen ChinaHeather D, LCSW

## 2016-08-31 NOTE — Progress Notes (Signed)
Subjective: She says she feels okay. No new complaints. Nephrology consultation noted and appreciated. She says her breathing is okay. Her wrist feels better with the brace off.  Objective: Vital signs in last 24 hours: Temp:  [98.2 F (36.8 C)-98.9 F (37.2 C)] 98.2 F (36.8 C) (07/25 0452) Pulse Rate:  [68-71] 71 (07/25 0452) Resp:  [18-19] 18 (07/25 0452) BP: (120-135)/(61-71) 120/61 (07/25 0452) SpO2:  [96 %-99 %] 96 % (07/25 0804) Weight:  [96.3 kg (212 lb 6.6 oz)-96.4 kg (212 lb 8 oz)] 96.3 kg (212 lb 6.6 oz) (07/25 0452) Weight change: -1.311 kg (-2 lb 14.2 oz) Last BM Date: 08/29/16  Intake/Output from previous day: 07/24 0701 - 07/25 0700 In: 1480 [P.O.:1480] Out: 1250 [Urine:1250]  PHYSICAL EXAM General appearance: alert, cooperative and no distress Resp: clear to auscultation bilaterally Cardio: irregularly irregular rhythm GI: soft, non-tender; bowel sounds normal; no masses,  no organomegaly Extremities: She still has some edema of her legs and some dependent edema of her arms but this is improving  Very hard of hearing  Lab Results:  Results for orders placed or performed during the hospital encounter of 08/18/16 (from the past 48 hour(s))  Glucose, capillary     Status: Abnormal   Collection Time: 08/29/16 11:34 AM  Result Value Ref Range   Glucose-Capillary 195 (H) 65 - 99 mg/dL   Comment 1 Notify RN    Comment 2 Document in Chart   Glucose, capillary     Status: Abnormal   Collection Time: 08/29/16  4:52 PM  Result Value Ref Range   Glucose-Capillary 103 (H) 65 - 99 mg/dL   Comment 1 Notify RN    Comment 2 Document in Chart   Glucose, capillary     Status: Abnormal   Collection Time: 08/29/16  9:08 PM  Result Value Ref Range   Glucose-Capillary 106 (H) 65 - 99 mg/dL  Glucose, capillary     Status: Abnormal   Collection Time: 08/30/16  5:49 AM  Result Value Ref Range   Glucose-Capillary 185 (H) 65 - 99 mg/dL  Protime-INR     Status: Abnormal   Collection Time: 08/30/16  6:02 AM  Result Value Ref Range   Prothrombin Time 19.3 (H) 11.4 - 15.2 seconds   INR 8.46   Basic metabolic panel     Status: Abnormal   Collection Time: 08/30/16  6:02 AM  Result Value Ref Range   Sodium 136 135 - 145 mmol/L   Potassium 4.1 3.5 - 5.1 mmol/L   Chloride 97 (L) 101 - 111 mmol/L   CO2 28 22 - 32 mmol/L   Glucose, Bld 182 (H) 65 - 99 mg/dL   BUN 65 (H) 6 - 20 mg/dL   Creatinine, Ser 2.41 (H) 0.44 - 1.00 mg/dL   Calcium 8.2 (L) 8.9 - 10.3 mg/dL   GFR calc non Af Amer 17 (L) >60 mL/min   GFR calc Af Amer 20 (L) >60 mL/min    Comment: (NOTE) The eGFR has been calculated using the CKD EPI equation. This calculation has not been validated in all clinical situations. eGFR's persistently <60 mL/min signify possible Chronic Kidney Disease.    Anion gap 11 5 - 15  Glucose, capillary     Status: Abnormal   Collection Time: 08/30/16  7:49 AM  Result Value Ref Range   Glucose-Capillary 202 (H) 65 - 99 mg/dL  Glucose, capillary     Status: Abnormal   Collection Time: 08/30/16 11:24 AM  Result Value  Ref Range   Glucose-Capillary 336 (H) 65 - 99 mg/dL  Glucose, capillary     Status: None   Collection Time: 08/30/16  4:20 PM  Result Value Ref Range   Glucose-Capillary 76 65 - 99 mg/dL  Complement, total     Status: None   Collection Time: 08/30/16  4:22 PM  Result Value Ref Range   Compl, Total (CH50) 60 >41 U/mL    Comment: (NOTE) Performed At: [facility] [address] [place], [place] [id number] Lindon Romp MD [id number]   VITAMIN D 25 Hydroxy (Vit-D Deficiency, Fractures)     Status: Abnormal   Collection Time: 08/30/16  4:22 PM  Result Value Ref Range   Vit D, 25-Hydroxy <4.0 (L) 30.0 - 100.0 ng/mL    Comment: (NOTE) Vitamin D deficiency has been defined by the [place] practice guideline as a level of serum 25-OH vitamin D less than 20 ng/mL (1,2). The Endocrine Society went on  to further define vitamin D insufficiency as a level between 21 and 29 ng/mL (2). 1. IOM (Institute of Medicine). 2010. Dietary reference   intakes for calcium and D. [place]: The   [employer]. 2. Holick MF, Binkley Crofton, Bischoff-Ferrari HA, et al.   Evaluation, treatment, and prevention of vitamin D   deficiency: an Endocrine Society clinical practice   guideline. JCEM. 2011 Jul; 96(7):1911-30. Performed At: [facility] [place], [place] [id number] Lindon Romp MD [id number]   Glucose, capillary     Status: Abnormal   Collection Time: 08/30/16  9:24 PM  Result Value Ref Range   Glucose-Capillary 51 (L) 65 - 99 mg/dL  Glucose, capillary     Status: None   Collection Time: 08/30/16  9:52 PM  Result Value Ref Range   Glucose-Capillary 96 65 - 99 mg/dL  Glucose, capillary     Status: Abnormal   Collection Time: 08/30/16 11:47 PM  Result Value Ref Range   Glucose-Capillary 124 (H) 65 - 99 mg/dL  Glucose, capillary     Status: Abnormal   Collection Time: 08/31/16  5:17 AM  Result Value Ref Range   Glucose-Capillary 110 (H) 65 - 99 mg/dL  Protime-INR     Status: Abnormal   Collection Time: 08/31/16  6:09 AM  Result Value Ref Range   Prothrombin Time 21.7 (H) 11.4 - 15.2 seconds   INR 4.76   Basic metabolic panel     Status: Abnormal   Collection Time: 08/31/16  6:09 AM  Result Value Ref Range   Sodium 137 135 - 145 mmol/L   Potassium 4.1 3.5 - 5.1 mmol/L   Chloride 99 (L) 101 - 111 mmol/L   CO2 28 22 - 32 mmol/L   Glucose, Bld 108 (H) 65 - 99 mg/dL   BUN 64 (H) 6 - 20 mg/dL   Creatinine, Ser 2.33 (H) 0.44 - 1.00 mg/dL   Calcium 8.4 (L) 8.9 - 10.3 mg/dL   GFR calc non Af Amer 18 (L) >60 mL/min   GFR calc Af Amer 21 (L) >60 mL/min    Comment: (NOTE) The eGFR has been calculated using the CKD EPI equation. This calculation has not been validated in all clinical situations. eGFR's persistently <60 mL/min signify possible Chronic  Kidney Disease.    Anion gap 10 5 - 15  Glucose, capillary     Status: Abnormal   Collection Time: 08/31/16  7:37 AM  Result Value Ref Range  Glucose-Capillary 118 (H) 65 - 99 mg/dL    ABGS No results for input(s): PHART, PO2ART, TCO2, HCO3 in the last 72 hours.  Invalid input(s): PCO2 CULTURES Recent Results (from the past 240 hour(s))  Urine Culture     Status: Abnormal   Collection Time: 08/26/16 10:43 AM  Result Value Ref Range Status   Specimen Description URINE, CATHETERIZED  Final   Special Requests NONE  Final   Culture (A)  Final    >=100,000 COLONIES/mL KLEBSIELLA PNEUMONIAE 50,000 COLONIES/mL GROUP B STREP(S.AGALACTIAE)ISOLATED TESTING AGAINST S. AGALACTIAE NOT ROUTINELY PERFORMED DUE TO PREDICTABILITY OF AMP/PEN/VAN SUSCEPTIBILITY. Performed at [facility], [place] [address] [place], [place] [address]    Report Status 08/28/2016 FINAL  Final   Organism ID, Bacteria KLEBSIELLA PNEUMONIAE (A)  Final      Susceptibility   Klebsiella pneumoniae - MIC*    AMPICILLIN RESISTANT Resistant     CEFAZOLIN <=4 SENSITIVE Sensitive     CEFTRIAXONE <=1 SENSITIVE Sensitive     CIPROFLOXACIN <=0.25 SENSITIVE Sensitive     GENTAMICIN <=1 SENSITIVE Sensitive     IMIPENEM <=0.25 SENSITIVE Sensitive     NITROFURANTOIN 32 SENSITIVE Sensitive     TRIMETH/SULFA <=20 SENSITIVE Sensitive     AMPICILLIN/SULBACTAM 4 SENSITIVE Sensitive     PIP/TAZO <=4 SENSITIVE Sensitive     Extended ESBL NEGATIVE Sensitive     * >=100,000 COLONIES/mL KLEBSIELLA PNEUMONIAE   Studies/Results: Dg Wrist Complete Right  Result Date: 08/30/2016 CLINICAL DATA:  Right wrist pain. EXAM: RIGHT WRIST - COMPLETE 3+ VIEW COMPARISON:  07/20/2016 FINDINGS: The patient has arthritis between the lunate, triquetrum, and hamate with cystic degenerative changes and sclerosis in the proximal pole of the hamate. There is slight irregularity of the triquetrum without a discrete fracture. There is chondrocalcinosis at  the radiocarpal joint. There is moderate arthritis of the first carpometacarpal joint. IMPRESSION: 1. No acute abnormality. 2. Arthritic changes in the midcarpal joint. 3. Chondrocalcinosis of the radiocarpal joint. 4. Osteoarthritis of the first carpometacarpal joint. 5. No discrete fracture of the triquetrum. Electronically Signed   By: Lorriane Shire M.D.   On: 08/30/2016 09:27   US Renal  Result Date: 08/30/2016 CLINICAL DATA:  Acute on chronic renal failure history of diabetes, hypertension, and coronary artery disease. EXAM: RENAL / URINARY TRACT ULTRASOUND COMPLETE COMPARISON:  None in PACs FINDINGS: Right Kidney: Length: 11.1 cm. The renal cortical echotexture is increased and is greater than that of the liver. There is no hydronephrosis. There are cystic structures in the inferior aspect of the right kidney. One measures 3.6 cm in greatest dimension in the other 2 cm in greatest dimension. Left Kidney: Length: 9 cm. The renal cortical echotexture is increased similar to that on the right. There is a midpole cortical cyst measuring 2.5 cm in greatest dimension. There is no hydronephrosis. Bladder: The partially distended urinary bladder is normal. IMPRESSION: Increased renal cortical echotexture bilaterally consistent with medical renal disease. There is no hydronephrosis. There are simple appearing cysts in both kidneys. The urinary bladder is unremarkable where visualized. Electronically Signed   By: David  Martinique M.D.   On: 08/30/2016 16:00    Medications:  Prior to Admission:  Prescriptions Prior to Admission  Medication Sig Dispense Refill Last Dose  . acetaminophen (TYLENOL) 325 MG tablet Take 2 tablets (650 mg total) by mouth every 4 (four) hours as needed for mild pain, moderate pain, fever or headache.   unknown  . ALPRAZolam (XANAX) 0.25 MG tablet Take  0.25 mg by mouth 3 (three) times daily as needed.   unknown  . amLODipine (NORVASC) 2.5 MG tablet TAKE (1) TABLET BY MOUTH DAILY. 30  tablet 6 08/18/2016 at Unknown time  . atorvastatin (LIPITOR) 40 MG tablet Take 1 tablet (40 mg total) by mouth daily. 90 tablet 3 08/18/2016 at Unknown time  . clopidogrel (PLAVIX) 75 MG tablet Take 1 tablet (75 mg total) by mouth daily. 90 tablet 3 08/18/2016 at Unknown time  . glimepiride (AMARYL) 2 MG tablet Take 2 mg by mouth daily.     08/18/2016 at Unknown time  . hydrALAZINE (APRESOLINE) 100 MG tablet Take 1 tablet (100 mg total) by mouth 3 (three) times daily. 270 tablet 3 08/18/2016 at Unknown time  . HYDROcodone-acetaminophen (NORCO/VICODIN) 5-325 MG tablet Take 1 tablet by mouth 4 (four) times daily.    08/18/2016 at Unknown time  . insulin detemir (LEVEMIR) 100 UNIT/ML injection Inject 0.2 mLs (20 Units total) into the skin at bedtime. 10 mL 11 08/17/2016 at Unknown time  . insulin lispro (HUMALOG) 100 UNIT/ML injection Inject 0-0.15 mLs (0-15 Units total) into the skin 3 (three) times daily with meals. Use as sliding scale 10 mL 11 08/18/2016 at Unknown time  . isosorbide mononitrate (IMDUR) 60 MG 24 hr tablet Take 1 tablet (60 mg total) by mouth daily. 90 tablet 3 08/18/2016 at Unknown time  . nitroGLYCERIN (NITROSTAT) 0.4 MG SL tablet Place 1 tablet (0.4 mg total) under the tongue every 5 (five) minutes as needed for chest pain. 25 tablet 2 unknown  . pantoprazole (PROTONIX) 40 MG tablet Take 1 tablet (40 mg total) by mouth daily at 6 (six) AM. 90 tablet 3 08/18/2016 at Unknown time  . PROAIR HFA 108 (90 Base) MCG/ACT inhaler Inhale 1-2 puffs into the lungs 4 (four) times daily.    08/18/2016 at Unknown time  . torsemide (DEMADEX) 20 MG tablet Take 2 tablets (40 mg total) by mouth 2 (two) times daily. 120 tablet 6 08/18/2016 at Unknown time  . TRAVATAN Z 0.004 % SOLN ophthalmic solution Place 1 drop into both eyes at bedtime.    08/17/2016 at Unknown time  . umeclidinium bromide (INCRUSE ELLIPTA) 62.5 MCG/INH AEPB Inhale 1 puff into the lungs daily.   08/18/2016 at Unknown time  . warfarin  (COUMADIN) 5 MG tablet TAKE 1 TABLET BY MOUTH DAILY EXCEPT 1/2 TABLET ON WEDNESDAYS AND SATURDAYS. 30 tablet 6 08/17/2016 at 2130   Scheduled: . amLODipine  10 mg Oral Daily  . atorvastatin  40 mg Oral q1800  . clopidogrel  75 mg Oral Daily  . hydrALAZINE  100 mg Oral TID  . insulin aspart  0-15 Units Subcutaneous TID WC  . insulin aspart  0-5 Units Subcutaneous QHS  . insulin aspart  4 Units Subcutaneous TID WC  . ipratropium-albuterol  3 mL Nebulization BID  . isosorbide mononitrate  90 mg Oral Daily  . latanoprost  1 drop Both Eyes QHS  . pantoprazole  40 mg Oral Q0600  . primidone  50 mg Oral Daily  . sodium chloride flush  3 mL Intravenous Q12H  . Vitamin D (Ergocalciferol)  50,000 Units Oral Q7 days  . Warfarin - Pharmacist Dosing Inpatient   Does not apply Q24H   Continuous: . sodium chloride     LOV:FIEPPI chloride, acetaminophen, albuterol, ALPRAZolam, bisacodyl, bisacodyl, magnesium hydroxide, metoprolol tartrate, morphine injection, ondansetron (ZOFRAN) IV, sodium chloride flush  Assesment: She came in with acute on chronic diastolic heart failure. She has  chronic atrial fibrillation she is chronically anticoagulated. She has chronic kidney disease stage IV and that improved initially as she was diuresing but now has gotten worse. Nephrology consultation is underway. Her vitamin D level was very low so I have added 50,000 units of vitamin D every week. She is known to have coronary artery occlusive disease at baseline and she had some chest pain on admission but none since. She has diabetes and that's doing fairly well. She had a significant issue with hypoglycemia earlier Principal Problem:   Acute on chronic diastolic CHF (congestive heart failure) (HCC) Active Problems:   Essential hypertension, benign   Persistent atrial fibrillation ([place])   Long term current use of anticoagulant   Chronic kidney disease (CKD), stage IV (severe) (HCC)   Status post coronary artery stent  placement   Diabetes mellitus type 2, controlled ([place])   Acute on chronic diastolic (congestive) heart failure (HCC)   Pressure injury of skin    Plan: Continue current treatments she has been recommended to go to skilled care facility which I think is appropriate but I want to see her kidney function stabilized before she goes    LOS: 12 days   Demesha Boorman L 08/31/2016, 8:35 AM

## 2016-08-31 NOTE — Progress Notes (Signed)
Patient admitted with acute on chronic diastolic HF, diuresed over 13 liters since admission. Diuretics held last few days due to AKI. Evidence of peripheral edema on exam but perhaps intravacularly depleted. Cr trending back down She will need to start oral diuretics in the near future. Seen by nephrology, defer to renal about when to start oral diuretics. Otherwise no additional cardiology recs at this time   Dina RichJonathan Rayshaun Needle MD

## 2016-08-31 NOTE — Progress Notes (Signed)
Late entry from 2100: pt has had 2 incontinent episodes of urine since 1900 08/30/16 and 2 incontinent episodes of urine on day shift since 1630 when the 24 hour urine was started. Pt stated she doesn't not feel when she has to go sometimes. External catheter replaced for urine sample. Will continue to monitor pt

## 2016-09-01 LAB — CBC
HEMATOCRIT: 23.5 % — AB (ref 36.0–46.0)
Hemoglobin: 7.5 g/dL — ABNORMAL LOW (ref 12.0–15.0)
MCH: 31 pg (ref 26.0–34.0)
MCHC: 31.9 g/dL (ref 30.0–36.0)
MCV: 97.1 fL (ref 78.0–100.0)
PLATELETS: 193 10*3/uL (ref 150–400)
RBC: 2.42 MIL/uL — AB (ref 3.87–5.11)
RDW: 15.1 % (ref 11.5–15.5)
WBC: 8.9 10*3/uL (ref 4.0–10.5)

## 2016-09-01 LAB — ANTINUCLEAR ANTIBODIES, IFA: ANTINUCLEAR ANTIBODIES, IFA: NEGATIVE

## 2016-09-01 LAB — BASIC METABOLIC PANEL
ANION GAP: 8 (ref 5–15)
BUN: 61 mg/dL — ABNORMAL HIGH (ref 6–20)
CO2: 28 mmol/L (ref 22–32)
Calcium: 8.2 mg/dL — ABNORMAL LOW (ref 8.9–10.3)
Chloride: 99 mmol/L — ABNORMAL LOW (ref 101–111)
Creatinine, Ser: 2.29 mg/dL — ABNORMAL HIGH (ref 0.44–1.00)
GFR calc Af Amer: 22 mL/min — ABNORMAL LOW (ref >60)
GFR, EST NON AFRICAN AMERICAN: 19 mL/min — AB (ref >60)
GLUCOSE: 152 mg/dL — AB (ref 65–99)
POTASSIUM: 4.3 mmol/L (ref 3.5–5.1)
Sodium: 135 mmol/L (ref 135–145)

## 2016-09-01 LAB — GLUCOSE, CAPILLARY
GLUCOSE-CAPILLARY: 156 mg/dL — AB (ref 65–99)
GLUCOSE-CAPILLARY: 131 mg/dL — AB (ref 65–99)
Glucose-Capillary: 149 mg/dL — ABNORMAL HIGH (ref 65–99)
Glucose-Capillary: 164 mg/dL — ABNORMAL HIGH (ref 65–99)

## 2016-09-01 LAB — PROTIME-INR
INR: 1.92
Prothrombin Time: 22.3 seconds — ABNORMAL HIGH (ref 11.4–15.2)

## 2016-09-01 MED ORDER — TORSEMIDE 20 MG PO TABS
20.0000 mg | ORAL_TABLET | Freq: Every day | ORAL | Status: DC
  Administered 2016-09-01 – 2016-09-02 (×2): 20 mg via ORAL
  Filled 2016-09-01 (×2): qty 1

## 2016-09-01 MED ORDER — SODIUM CHLORIDE 0.9 % IV SOLN
510.0000 mg | INTRAVENOUS | Status: DC
  Administered 2016-09-01: 510 mg via INTRAVENOUS
  Filled 2016-09-01: qty 17

## 2016-09-01 MED ORDER — WARFARIN SODIUM 2.5 MG PO TABS
7.5000 mg | ORAL_TABLET | Freq: Once | ORAL | Status: AC
  Administered 2016-09-01: 7.5 mg via ORAL
  Filled 2016-09-01: qty 1

## 2016-09-01 NOTE — Progress Notes (Signed)
ANTICOAGULATION CONSULT NOTE  Pharmacy Consult for COUMADIN (home med) Indication: atrial fibrillation  No Known Allergies  Patient Measurements: Height: 5\' 4"  (162.6 cm) Weight: 213 lb 6.4 oz (96.8 kg) IBW/kg (Calculated) : 54.7  Vital Signs: Temp: 98.8 F (37.1 C) (07/26 0546) Temp Source: Oral (07/26 0546) BP: 150/78 (07/26 0546) Pulse Rate: 65 (07/26 0546)  Labs:  Recent Labs  08/30/16 0602 08/31/16 0609 09/01/16 0509  HGB  --   --  7.5*  HCT  --   --  23.5*  PLT  --   --  193  LABPROT 19.3* 21.7* 22.3*  INR 1.61 1.86 1.92  CREATININE 2.41* 2.33* 2.29*   Estimated Creatinine Clearance: 21 mL/min (A) (by C-G formula based on SCr of 2.29 mg/dL (H)).  Medical History: Past Medical History:  Diagnosis Date  . Atrial fibrillation (HCC)   . CAD (coronary artery disease)    Multivessel status post high risk PCI/DES to circumflex 11/2015 (poor candidate for CABG)  . CHF (congestive heart failure) (HCC)   . CKD (chronic kidney disease) stage 3, GFR 30-59 ml/min   . DDD (degenerative disc disease), lumbar   . Essential hypertension, benign   . Mixed hyperlipidemia   . Type 2 diabetes mellitus (HCC)    Medications:  Prescriptions Prior to Admission  Medication Sig Dispense Refill Last Dose  . acetaminophen (TYLENOL) 325 MG tablet Take 2 tablets (650 mg total) by mouth every 4 (four) hours as needed for mild pain, moderate pain, fever or headache.   unknown  . ALPRAZolam (XANAX) 0.25 MG tablet Take 0.25 mg by mouth 3 (three) times daily as needed.   unknown  . amLODipine (NORVASC) 2.5 MG tablet TAKE (1) TABLET BY MOUTH DAILY. 30 tablet 6 08/18/2016 at Unknown time  . atorvastatin (LIPITOR) 40 MG tablet Take 1 tablet (40 mg total) by mouth daily. 90 tablet 3 08/18/2016 at Unknown time  . clopidogrel (PLAVIX) 75 MG tablet Take 1 tablet (75 mg total) by mouth daily. 90 tablet 3 08/18/2016 at Unknown time  . glimepiride (AMARYL) 2 MG tablet Take 2 mg by mouth daily.      08/18/2016 at Unknown time  . hydrALAZINE (APRESOLINE) 100 MG tablet Take 1 tablet (100 mg total) by mouth 3 (three) times daily. 270 tablet 3 08/18/2016 at Unknown time  . HYDROcodone-acetaminophen (NORCO/VICODIN) 5-325 MG tablet Take 1 tablet by mouth 4 (four) times daily.    08/18/2016 at Unknown time  . insulin detemir (LEVEMIR) 100 UNIT/ML injection Inject 0.2 mLs (20 Units total) into the skin at bedtime. 10 mL 11 08/17/2016 at Unknown time  . insulin lispro (HUMALOG) 100 UNIT/ML injection Inject 0-0.15 mLs (0-15 Units total) into the skin 3 (three) times daily with meals. Use as sliding scale 10 mL 11 08/18/2016 at Unknown time  . isosorbide mononitrate (IMDUR) 60 MG 24 hr tablet Take 1 tablet (60 mg total) by mouth daily. 90 tablet 3 08/18/2016 at Unknown time  . nitroGLYCERIN (NITROSTAT) 0.4 MG SL tablet Place 1 tablet (0.4 mg total) under the tongue every 5 (five) minutes as needed for chest pain. 25 tablet 2 unknown  . pantoprazole (PROTONIX) 40 MG tablet Take 1 tablet (40 mg total) by mouth daily at 6 (six) AM. 90 tablet 3 08/18/2016 at Unknown time  . PROAIR HFA 108 (90 Base) MCG/ACT inhaler Inhale 1-2 puffs into the lungs 4 (four) times daily.    08/18/2016 at Unknown time  . torsemide (DEMADEX) 20 MG tablet Take 2 tablets (  40 mg total) by mouth 2 (two) times daily. 120 tablet 6 08/18/2016 at Unknown time  . TRAVATAN Z 0.004 % SOLN ophthalmic solution Place 1 drop into both eyes at bedtime.    08/17/2016 at Unknown time  . umeclidinium bromide (INCRUSE ELLIPTA) 62.5 MCG/INH AEPB Inhale 1 puff into the lungs daily.   08/18/2016 at Unknown time  . warfarin (COUMADIN) 5 MG tablet TAKE 1 TABLET BY MOUTH DAILY EXCEPT 1/2 TABLET ON WEDNESDAYS AND SATURDAYS. 30 tablet 6 08/17/2016 at 2130   Assessment: 83yo female on chronic Coumadin PTA for h/o afib.  Warfarin previously held due to elevated INR and anemia.  OK to restart per cardiology on 7/17.  INR below goal again today .    Goal of Therapy:  INR  2-3 Monitor platelets by anticoagulation protocol: Yes   Plan:  Coumadin 7.5mg  today x 1 to boost INR INR daily Monitor for signs and symptoms of bleeding.   Valrie HartHall, Lakyn Alsteen A 09/01/2016,12:22 PM

## 2016-09-01 NOTE — Progress Notes (Signed)
Physical Therapy Treatment Patient Details Name: Melissa [name]: [id number] DOB: Aug 18, 1930 Today's Date: 09/01/2016    History of Present Illness Melissa [name] is an 83yo [facility] acute on chronic CHF exacerbation. Pt also being seen by cardiology for extensive coronary artery disease x3 not thought to be appropriate for CABG at this time. PMH: LTHA, [id number](2017) c AF and total LAD occlusion, IDDM2. Pt was evaluated on 7/20 by PT but thought to have progressively declined since then. PT is asked to re-evaluate the palcement for DC updated recommendations.     PT Comments    Pt sitting in chair upon therapist arrival, pt friendly and no reoprts of pain at entrance or through session.  Session focus on therapeutic activities and LE strengthening.  Pt able to complete all exercises with proper form following cueing for control with exercise for maximal benefits.  Sit to stand complete with minimal assistance required, cueing for proper hand placement to increase ease and assist with task.  EOS pt was limited by fatigue, no reports of pain through session.    Follow Up Recommendations  SNF     Equipment Recommendations       Recommendations for Other Services       Precautions / Restrictions Precautions Precautions: None Precaution Comments: Right broken hand (June 2018)     Mobility  Bed Mobility               General bed mobility comments: pt sitting in chair upon arrival  Transfers Overall transfer level: Needs assistance Equipment used: Standard walker Transfers: Sit to/from Stand (3 reps for strengthening) Sit to Stand: Min assist         General transfer comment: Cueing for hand placement  Ambulation/Gait                 Stairs            Wheelchair Mobility    Modified Rankin (Stroke Patients Only)       Balance                                            Cognition  Arousal/Alertness: Awake/alert Behavior During Therapy: WFL for tasks assessed/performed Overall Cognitive Status: Within Functional Limits for tasks assessed                                        Exercises General Exercises - Lower Extremity Ankle Circles/Pumps: Both;10 reps Long Arc Quad: AROM;Both;10 reps Hip ABduction/ADduction: Both;Seated;10 reps Hip Flexion/Marching: AROM;Both;Seated;10 reps (alternating)    General Comments        Pertinent Vitals/Pain Pain Assessment: No/denies pain    Home Living                      Prior Function            PT Goals (current goals can now be found in the care plan section)      Frequency    Min 2X/week      PT Plan      Co-evaluation              AM-PAC PT "6 Clicks" Daily Activity  Outcome Measure  Difficulty turning over in bed (including adjusting  bedclothes, sheets and blankets)?: A Little Difficulty moving from lying on back to sitting on the side of the bed? : A Little Difficulty sitting down on and standing up from a chair with arms (e.g., wheelchair, bedside commode, etc,.)?: A Little Help needed moving to and from a bed to chair (including a wheelchair)?: A Little Help needed walking in hospital room?: A Lot Help needed climbing 3-5 steps with a railing? : Total 6 Click Score: 15    End of Session Equipment Utilized During Treatment: Gait belt Activity Tolerance: Patient tolerated treatment well;Patient limited by fatigue Patient left: in chair;with call bell/phone within reach;with family/visitor present Nurse Communication: Mobility status PT Visit Diagnosis: Unsteadiness on feet (R26.81);Repeated falls (R29.6);Muscle weakness (generalized) (M62.81)     Time: [id number] PT Time Calculation (min) (ACUTE ONLY): 22 min  Charges:  $Therapeutic Activity: 8-22 mins                    G Codes:       Becky SaxCasey Britton Perkinson, LPTA; CBIS [phone]  Melissa [name], Melissa Amara  [name] 09/01/2016, 1:40 PM

## 2016-09-01 NOTE — Progress Notes (Signed)
PT Cancellation Note  Patient Details Name: Melissa [name] MRN: [id number] DOB: Jun 23, 1930   Cancelled Treatment:    Reason Eval/Treat Not Completed: Other (comment) (PT treatment attempted. Pt is with NA currently getting bath and dressing. Will attempt again at later date/time. )  10:17 AM, 09/01/16 Rosamaria LintsAllan C Katesha Eichel, PT, DPT Physical Therapist - [facility] [phone](ASCOM)  [phone] (Office)    Melissa [name] C 09/01/2016, 10:16 AM

## 2016-09-01 NOTE — Care Management Note (Signed)
Case Management Note  Patient Details  Name: Melissa HammedBettie L Olthoff MRN: [id number] Date of Birth: 05-18-1930  If discussed at Long Length of Stay Meetings, dates discussed:   09/01/2016 Additional Comments:  Orlo Brickle, Chrystine OilerSharley Diane, RN 09/01/2016, 10:28 AM

## 2016-09-01 NOTE — Progress Notes (Signed)
Subjective: Interval History: has no complaint of difficulty breathing. Patient also denies any cough..  Objective: Vital signs in last 24 hours: Temp:  [98.8 F (37.1 C)-99.6 F (37.6 C)] 98.8 F (37.1 C) (07/26 0546) Pulse Rate:  [65-83] 65 (07/26 0546) Resp:  [15-18] 15 (07/26 0546) BP: (120-150)/(60-92) 150/78 (07/26 0546) SpO2:  [95 %-100 %] 97 % (07/26 0754) Weight:  [96.8 kg (213 lb 6.4 oz)] 96.8 kg (213 lb 6.4 oz) (07/26 0546) Weight change: 0.408 kg (14.4 oz)  Intake/Output from previous day: 07/25 0701 - 07/26 0700 In: 723 [P.O.:720; I.V.:3] Out: 500 [Urine:500] Intake/Output this shift: No intake/output data recorded.  Generally patient is alert and in no apparent distress She has elevated JVD Chest decreased breath sounds bilaterally Heart exam: irevealed regular rate and rhythm Extremities: She has 1+ edema  Lab Results:  Recent Labs  09/01/16 0509  WBC 8.9  HGB 7.5*  HCT 23.5*  PLT 193   BMET:  Recent Labs  08/31/16 0609 09/01/16 0509  NA 137 135  K 4.1 4.3  CL 99* 99*  CO2 28 28  GLUCOSE 108* 152*  BUN 64* 61*  CREATININE 2.33* 2.29*  CALCIUM 8.4* 8.2*   No results for input(s): PTH in the last 72 hours. Iron Studies:  Recent Labs  08/31/16 0609  IRON 32  TIBC 265  FERRITIN 88    Studies/Results: [place] Renal  Result Date: 08/30/2016 CLINICAL DATA:  Acute on chronic renal failure history of diabetes, hypertension, and coronary artery disease. EXAM: RENAL / URINARY TRACT ULTRASOUND COMPLETE COMPARISON:  None in PACs FINDINGS: Right Kidney: Length: 11.1 cm. The renal cortical echotexture is increased and is greater than that of the liver. There is no hydronephrosis. There are cystic structures in the inferior aspect of the right kidney. One measures 3.6 cm in greatest dimension in the other 2 cm in greatest dimension. Left Kidney: Length: 9 cm. The renal cortical echotexture is increased similar to that on the right. There is a midpole  cortical cyst measuring 2.5 cm in greatest dimension. There is no hydronephrosis. Bladder: The partially distended urinary bladder is normal. IMPRESSION: Increased renal cortical echotexture bilaterally consistent with medical renal disease. There is no hydronephrosis. There are simple appearing cysts in both kidneys. The urinary bladder is unremarkable where visualized. Electronically Signed   By: David  Swaziland[place] M.D.   On: 08/30/2016 16:00    I have reviewed the patient's current medications.  Assessment/Plan: Problem #1 renal failure: Possibly acute on chronic. Her creatinine is 2.29 and her renal function is improving. Problem #2 CHF: Patient denies any difficulty breathing and seems to be feeling better. Problem #3 anemia: Her hemoglobin is low. Her iron saturation is 12% and ferritin of 88. Hence anemia of iron deficiency and possibly chronic renal failure. Problem #4 Bone and mineral disorder: Her calcium and phosphorus is range. Patient was very low vitamin D level of 4. Problem #5 Atrial fibrillation: Her heart rate is controlled Problem #6 hypertension: Her blood pressure is reasonably controlled  Plan: 1] We'll start patient on Demadex 20 mg once a day 2] we'll give her Feraheme 510 mg iv today and repeat after a week for 2 doses 3] we'll check her renal panel in the morning   LOS: 13 days   Melissa [name] S 09/01/2016,9:12 AM

## 2016-09-01 NOTE — Progress Notes (Signed)
Subjective: She says she feels okay. She has no new complaints. No chest pain. Her breathing is pretty good. She still has significant edema  Objective: Vital signs in last 24 hours: Temp:  [98.8 F (37.1 C)-99.6 F (37.6 C)] 98.8 F (37.1 C) (07/26 0546) Pulse Rate:  [65-83] 65 (07/26 0546) Resp:  [15-18] 15 (07/26 0546) BP: (120-150)/(60-92) 150/78 (07/26 0546) SpO2:  [95 %-100 %] 97 % (07/26 0754) Weight:  [96.8 kg (213 lb 6.4 oz)] 96.8 kg (213 lb 6.4 oz) (07/26 0546) Weight change: 0.408 kg (14.4 oz) Last BM Date: 08/29/16  Intake/Output from previous day: 07/25 0701 - 07/26 0700 In: 723 [P.O.:720; I.V.:3] Out: 500 [Urine:500]  PHYSICAL EXAM General appearance: alert, cooperative and no distress Resp: rhonchi bilaterally Cardio: irregularly irregular rhythm GI: soft, non-tender; bowel sounds normal; no masses,  no organomegaly ExtremitieStill significant peripheral edema.Still significant peripheral edema. Hard of hearing  Lab Results:  Results for orders placed or performed during the hospital encounter of 08/18/16 (from the past 48 hour(s))  Glucose, capillary     Status: Abnormal   Collection Time: 08/30/16 11:24 AM  Result Value Ref Range   Glucose-Capillary 336 (H) 65 - 99 mg/dL  Protein, urine, 24 hour     Status: Abnormal   Collection Time: 08/30/16  2:49 PM  Result Value Ref Range   Urine Total Volume-UPROT 1,050 mL   Collection Interval-UPROT 24 hours   Protein, Urine 19 mg/dL   Protein, 24H Urine 200 (H) 50 - 100 mg/day  Glucose, capillary     Status: None   Collection Time: 08/30/16  4:20 PM  Result Value Ref Range   Glucose-Capillary 76 65 - 99 mg/dL  Hepatitis B surface antigen     Status: None   Collection Time: 08/30/16  4:22 PM  Result Value Ref Range   Hepatitis B Surface Ag Negative Negative    Comment: (NOTE) Performed At: [facility] [place], [place] [id number] Lindon Romp MD [id number]   Hepatitis C  antibody     Status: None   Collection Time: 08/30/16  4:22 PM  Result Value Ref Range   HCV Ab <0.1 0.0 - 0.9 s/co ratio    Comment: (NOTE)                                  Negative:     < 0.8                             Indeterminate: 0.8 - 0.9                                  Positive:     > 0.9 The CDC recommends that a positive HCV antibody result be followed up with a HCV Nucleic Acid Amplification test (299242). Performed At: [facility] [place], [place] [id number] Lindon Romp MD [id number]   C3 complement     Status: None   Collection Time: 08/30/16  4:22 PM  Result Value Ref Range   C3 Complement 108 82 - 167 mg/dL    Comment: (NOTE) Performed At: [facility] [place], [place] [id number] Lindon Romp MD [id number]   C4 complement     Status: None   Collection Time: 08/30/16  4:22 PM  Result Value Ref Range   Complement C4, Body Fluid 38 14 - 44 mg/dL    Comment: (NOTE) Performed At: [facility] [place], [place] [id number] Lindon Romp MD [id number]   Complement, total     Status: None   Collection Time: 08/30/16  4:22 PM  Result Value Ref Range   Compl, Total (CH50) 60 >41 U/mL    Comment: (NOTE) Performed At: [facility] [address] [place], [place] [id number] Lindon Romp MD [id number]   VITAMIN D 25 Hydroxy (Vit-D Deficiency, Fractures)     Status: Abnormal   Collection Time: 08/30/16  4:22 PM  Result Value Ref Range   Vit D, 25-Hydroxy <4.0 (L) 30.0 - 100.0 ng/mL    Comment: (NOTE) Vitamin D deficiency has been defined by the Institute of Medicine and an Endocrine Society practice guideline as a level of serum 25-OH vitamin D less than 20 ng/mL (1,2). The Endocrine Society went on to further define vitamin D insufficiency as a level between 21 and 29 ng/mL (2). 1. IOM (Institute of Medicine). 2010. Dietary reference   intakes for calcium and  D. [place]: The   [employer]. 2. Holick MF, Binkley Henderson, Bischoff-Ferrari HA, et al.   Evaluation, treatment, and prevention of vitamin D   deficiency: an Endocrine Society clinical practice   guideline. JCEM. 2011 Jul; 96(7):1911-30. Performed At: [facility] [place], [place] [id number] Lindon Romp MD [id number]   Glucose, capillary     Status: Abnormal   Collection Time: 08/30/16  9:24 PM  Result Value Ref Range   Glucose-Capillary 51 (L) 65 - 99 mg/dL  Glucose, capillary     Status: None   Collection Time: 08/30/16  9:52 PM  Result Value Ref Range   Glucose-Capillary 96 65 - 99 mg/dL  Glucose, capillary     Status: Abnormal   Collection Time: 08/30/16 11:47 PM  Result Value Ref Range   Glucose-Capillary 124 (H) 65 - 99 mg/dL  Glucose, capillary     Status: Abnormal   Collection Time: 08/31/16  5:17 AM  Result Value Ref Range   Glucose-Capillary 110 (H) 65 - 99 mg/dL  Protime-INR     Status: Abnormal   Collection Time: 08/31/16  6:09 AM  Result Value Ref Range   Prothrombin Time 21.7 (H) 11.4 - 15.2 seconds   INR 1.86   Iron and TIBC     Status: None   Collection Time: 08/31/16  6:09 AM  Result Value Ref Range   Iron 32 28 - 170 ug/dL   TIBC 265 250 - 450 ug/dL   Saturation Ratios 12 10.4 - 31.8 %   UIBC 233 ug/dL    Comment: Performed at [facility], [place]. [address], [place], [place] [address]  Ferritin     Status: None   Collection Time: 08/31/16  6:09 AM  Result Value Ref Range   Ferritin 88 11 - 307 ng/mL    Comment: Performed at [facility], [place] [address], [place], [place] [address]  Basic metabolic panel     Status: Abnormal   Collection Time: 08/31/16  6:09 AM  Result Value Ref Range   Sodium 137 135 - 145 mmol/L   Potassium 4.1 3.5 - 5.1 mmol/L   Chloride 99 (L) 101 - 111 mmol/L   CO2 28 22 - 32 mmol/L   Glucose, Bld 108 (H) 65 - 99 mg/dL  BUN 64 (H) 6 - 20 mg/dL   Creatinine, Ser 2.33 (H)  0.44 - 1.00 mg/dL   Calcium 8.4 (L) 8.9 - 10.3 mg/dL   GFR calc non Af Amer 18 (L) >60 mL/min   GFR calc Af Amer 21 (L) >60 mL/min    Comment: (NOTE) The eGFR has been calculated using the CKD EPI equation. This calculation has not been validated in all clinical situations. eGFR's persistently <60 mL/min signify possible Chronic Kidney Disease.    Anion gap 10 5 - 15  Glucose, capillary     Status: Abnormal   Collection Time: 08/31/16  7:37 AM  Result Value Ref Range   Glucose-Capillary 118 (H) 65 - 99 mg/dL  Glucose, capillary     Status: Abnormal   Collection Time: 08/31/16 11:02 AM  Result Value Ref Range   Glucose-Capillary 143 (H) 65 - 99 mg/dL  Glucose, capillary     Status: None   Collection Time: 08/31/16  4:13 PM  Result Value Ref Range   Glucose-Capillary 99 65 - 99 mg/dL  Glucose, capillary     Status: Abnormal   Collection Time: 08/31/16  9:12 PM  Result Value Ref Range   Glucose-Capillary 148 (H) 65 - 99 mg/dL   Comment 1 Notify RN    Comment 2 Document in Chart   Protime-INR     Status: Abnormal   Collection Time: 09/01/16  5:09 AM  Result Value Ref Range   Prothrombin Time 22.3 (H) 11.4 - 15.2 seconds   INR 1.92   CBC     Status: Abnormal   Collection Time: 09/01/16  5:09 AM  Result Value Ref Range   WBC 8.9 4.0 - 10.5 K/uL   RBC 2.42 (L) 3.87 - 5.11 MIL/uL   Hemoglobin 7.5 (L) 12.0 - 15.0 g/dL   HCT 23.5 (L) 36.0 - 46.0 %   MCV 97.1 78.0 - 100.0 fL   MCH 31.0 26.0 - 34.0 pg   MCHC 31.9 30.0 - 36.0 g/dL   RDW 15.1 11.5 - 15.5 %   Platelets 193 150 - 400 K/uL  Basic metabolic panel     Status: Abnormal   Collection Time: 09/01/16  5:09 AM  Result Value Ref Range   Sodium 135 135 - 145 mmol/L   Potassium 4.3 3.5 - 5.1 mmol/L   Chloride 99 (L) 101 - 111 mmol/L   CO2 28 22 - 32 mmol/L   Glucose, Bld 152 (H) 65 - 99 mg/dL   BUN 61 (H) 6 - 20 mg/dL   Creatinine, Ser 2.29 (H) 0.44 - 1.00 mg/dL   Calcium 8.2 (L) 8.9 - 10.3 mg/dL   GFR calc non Af Amer  19 (L) >60 mL/min   GFR calc Af Amer 22 (L) >60 mL/min    Comment: (NOTE) The eGFR has been calculated using the CKD EPI equation. This calculation has not been validated in all clinical situations. eGFR's persistently <60 mL/min signify possible Chronic Kidney Disease.    Anion gap 8 5 - 15  Glucose, capillary     Status: Abnormal   Collection Time: 09/01/16  7:24 AM  Result Value Ref Range   Glucose-Capillary 149 (H) 65 - 99 mg/dL    ABGS No results for input(s): PHART, PO2ART, TCO2, HCO3 in the last 72 hours.  Invalid input(s): PCO2 CULTURES Recent Results (from the past 240 hour(s))  Urine Culture     Status: Abnormal   Collection Time: 08/26/16 10:43 AM  Result Value Ref Range  Status   Specimen Description URINE, CATHETERIZED  Final   Special Requests NONE  Final   Culture (A)  Final    >=100,000 COLONIES/mL KLEBSIELLA PNEUMONIAE 50,000 COLONIES/mL GROUP B STREP(S.AGALACTIAE)ISOLATED TESTING AGAINST S. AGALACTIAE NOT ROUTINELY PERFORMED DUE TO PREDICTABILITY OF AMP/PEN/VAN SUSCEPTIBILITY. Performed at [facility], [place] [address], [place], [place] [address]    Report Status 08/28/2016 FINAL  Final   Organism ID, Bacteria KLEBSIELLA PNEUMONIAE (A)  Final      Susceptibility   Klebsiella pneumoniae - MIC*    AMPICILLIN RESISTANT Resistant     CEFAZOLIN <=4 SENSITIVE Sensitive     CEFTRIAXONE <=1 SENSITIVE Sensitive     CIPROFLOXACIN <=0.25 SENSITIVE Sensitive     GENTAMICIN <=1 SENSITIVE Sensitive     IMIPENEM <=0.25 SENSITIVE Sensitive     NITROFURANTOIN 32 SENSITIVE Sensitive     TRIMETH/SULFA <=20 SENSITIVE Sensitive     AMPICILLIN/SULBACTAM 4 SENSITIVE Sensitive     PIP/TAZO <=4 SENSITIVE Sensitive     Extended ESBL NEGATIVE Sensitive     * >=100,000 COLONIES/mL KLEBSIELLA PNEUMONIAE   Studies/Results: Dg Wrist Complete Right  Result Date: 08/30/2016 CLINICAL DATA:  Right wrist pain. EXAM: RIGHT WRIST - COMPLETE 3+ VIEW COMPARISON:  07/20/2016  FINDINGS: The patient has arthritis between the lunate, triquetrum, and hamate with cystic degenerative changes and sclerosis in the proximal pole of the hamate. There is slight irregularity of the triquetrum without a discrete fracture. There is chondrocalcinosis at the radiocarpal joint. There is moderate arthritis of the first carpometacarpal joint. IMPRESSION: 1. No acute abnormality. 2. Arthritic changes in the midcarpal joint. 3. Chondrocalcinosis of the radiocarpal joint. 4. Osteoarthritis of the first carpometacarpal joint. 5. No discrete fracture of the triquetrum. Electronically Signed   By: Lorriane Shire M.D.   On: 08/30/2016 09:27   US Renal  Result Date: 08/30/2016 CLINICAL DATA:  Acute on chronic renal failure history of diabetes, hypertension, and coronary artery disease. EXAM: RENAL / URINARY TRACT ULTRASOUND COMPLETE COMPARISON:  None in PACs FINDINGS: Right Kidney: Length: 11.1 cm. The renal cortical echotexture is increased and is greater than that of the liver. There is no hydronephrosis. There are cystic structures in the inferior aspect of the right kidney. One measures 3.6 cm in greatest dimension in the other 2 cm in greatest dimension. Left Kidney: Length: 9 cm. The renal cortical echotexture is increased similar to that on the right. There is a midpole cortical cyst measuring 2.5 cm in greatest dimension. There is no hydronephrosis. Bladder: The partially distended urinary bladder is normal. IMPRESSION: Increased renal cortical echotexture bilaterally consistent with medical renal disease. There is no hydronephrosis. There are simple appearing cysts in both kidneys. The urinary bladder is unremarkable where visualized. Electronically Signed   By: David  Martinique M.D.   On: 08/30/2016 16:00    Medications:  Prior to Admission:  Prescriptions Prior to Admission  Medication Sig Dispense Refill Last Dose  . acetaminophen (TYLENOL) 325 MG tablet Take 2 tablets (650 mg total) by mouth  every 4 (four) hours as needed for mild pain, moderate pain, fever or headache.   unknown  . ALPRAZolam (XANAX) 0.25 MG tablet Take 0.25 mg by mouth 3 (three) times daily as needed.   unknown  . amLODipine (NORVASC) 2.5 MG tablet TAKE (1) TABLET BY MOUTH DAILY. 30 tablet 6 08/18/2016 at Unknown time  . atorvastatin (LIPITOR) 40 MG tablet Take 1 tablet (40 mg total) by mouth daily. 90 tablet 3 08/18/2016 at Unknown time  .  clopidogrel (PLAVIX) 75 MG tablet Take 1 tablet (75 mg total) by mouth daily. 90 tablet 3 08/18/2016 at Unknown time  . glimepiride (AMARYL) 2 MG tablet Take 2 mg by mouth daily.     08/18/2016 at Unknown time  . hydrALAZINE (APRESOLINE) 100 MG tablet Take 1 tablet (100 mg total) by mouth 3 (three) times daily. 270 tablet 3 08/18/2016 at Unknown time  . HYDROcodone-acetaminophen (NORCO/VICODIN) 5-325 MG tablet Take 1 tablet by mouth 4 (four) times daily.    08/18/2016 at Unknown time  . insulin detemir (LEVEMIR) 100 UNIT/ML injection Inject 0.2 mLs (20 Units total) into the skin at bedtime. 10 mL 11 08/17/2016 at Unknown time  . insulin lispro (HUMALOG) 100 UNIT/ML injection Inject 0-0.15 mLs (0-15 Units total) into the skin 3 (three) times daily with meals. Use as sliding scale 10 mL 11 08/18/2016 at Unknown time  . isosorbide mononitrate (IMDUR) 60 MG 24 hr tablet Take 1 tablet (60 mg total) by mouth daily. 90 tablet 3 08/18/2016 at Unknown time  . nitroGLYCERIN (NITROSTAT) 0.4 MG SL tablet Place 1 tablet (0.4 mg total) under the tongue every 5 (five) minutes as needed for chest pain. 25 tablet 2 unknown  . pantoprazole (PROTONIX) 40 MG tablet Take 1 tablet (40 mg total) by mouth daily at 6 (six) AM. 90 tablet 3 08/18/2016 at Unknown time  . PROAIR HFA 108 (90 Base) MCG/ACT inhaler Inhale 1-2 puffs into the lungs 4 (four) times daily.    08/18/2016 at Unknown time  . torsemide (DEMADEX) 20 MG tablet Take 2 tablets (40 mg total) by mouth 2 (two) times daily. 120 tablet 6 08/18/2016 at Unknown  time  . TRAVATAN Z 0.004 % SOLN ophthalmic solution Place 1 drop into both eyes at bedtime.    08/17/2016 at Unknown time  . umeclidinium bromide (INCRUSE ELLIPTA) 62.5 MCG/INH AEPB Inhale 1 puff into the lungs daily.   08/18/2016 at Unknown time  . warfarin (COUMADIN) 5 MG tablet TAKE 1 TABLET BY MOUTH DAILY EXCEPT 1/2 TABLET ON WEDNESDAYS AND SATURDAYS. 30 tablet 6 08/17/2016 at 2130   Scheduled: . amLODipine  10 mg Oral Daily  . atorvastatin  40 mg Oral q1800  . clopidogrel  75 mg Oral Daily  . hydrALAZINE  100 mg Oral TID  . insulin aspart  0-15 Units Subcutaneous TID WC  . insulin aspart  0-5 Units Subcutaneous QHS  . insulin aspart  4 Units Subcutaneous TID WC  . ipratropium-albuterol  3 mL Nebulization BID  . isosorbide mononitrate  90 mg Oral Daily  . latanoprost  1 drop Both Eyes QHS  . pantoprazole  40 mg Oral Q0600  . primidone  50 mg Oral Daily  . sodium chloride flush  3 mL Intravenous Q12H  . Vitamin D (Ergocalciferol)  50,000 Units Oral Q7 days  . Warfarin - Pharmacist Dosing Inpatient   Does not apply Q24H   Continuous: . sodium chloride     YKD:XIPJAS chloride, acetaminophen, albuterol, ALPRAZolam, bisacodyl, bisacodyl, magnesium hydroxide, metoprolol tartrate, morphine injection, ondansetron (ZOFRAN) IV, sodium chloride flush  Assesment: She was admitted with acute on chronic diastolic heart failure. She has diuresed well but has developed acute on chronic renal failure with acute kidney injury. At baseline she has chronic atrial fib on chronic anticoagulation. Her hemoglobin level this morning is 7.5 but I think that's mostly anemia of chronic disease. She has coronary disease and has had a stent placed and has some chest pain on admission but has  not had chest pain since. She has diabetes which is pretty well controlled. She has hypertension which is been pretty well controlled. Her renal function is about the same this morning as yesterday. I discussed CODE STATUS with  her this morning and she wants DO NOT RESUSCITATE status which I think is appropriate considering her multiple medical problems general frailty. Principal Problem:   Acute on chronic diastolic CHF (congestive heart failure) (HCC) Active Problems:   Essential hypertension, benign   Persistent atrial fibrillation (HCC)   Long term current use of anticoagulant   Chronic kidney disease (CKD), stage IV (severe) (HCC)   Status post coronary artery stent placement   Diabetes mellitus type 2, controlled ([place])   Acute on chronic diastolic (congestive) heart failure (HCC)   Pressure injury of skin    Plan: Continue treatments. Nephrology to assess need for diuretics potential discharge to skilled care facility tomorrow    LOS: 13 days   Alizay Bronkema L 09/01/2016, 8:21 AM

## 2016-09-02 ENCOUNTER — Inpatient Hospital Stay
Admission: RE | Admit: 2016-09-02 | Discharge: 2016-10-05 | Disposition: A | Discharge status: Home / Self Care | Payer: Medicare Other | Source: Ambulatory Visit | Attending: Pulmonary Disease | Admitting: Pulmonary Disease

## 2016-09-02 DIAGNOSIS — I5032 Chronic diastolic (congestive) heart failure: Secondary | ICD-10-CM | POA: Diagnosis not present

## 2016-09-02 DIAGNOSIS — R262 Difficulty in walking, not elsewhere classified: Secondary | ICD-10-CM | POA: Diagnosis not present

## 2016-09-02 DIAGNOSIS — B961 Klebsiella pneumoniae [K. pneumoniae] as the cause of diseases classified elsewhere: Secondary | ICD-10-CM | POA: Diagnosis not present

## 2016-09-02 DIAGNOSIS — Z9981 Dependence on supplemental oxygen: Secondary | ICD-10-CM | POA: Diagnosis not present

## 2016-09-02 DIAGNOSIS — I2510 Atherosclerotic heart disease of native coronary artery without angina pectoris: Secondary | ICD-10-CM | POA: Diagnosis not present

## 2016-09-02 DIAGNOSIS — M79661 Pain in right lower leg: Secondary | ICD-10-CM | POA: Diagnosis not present

## 2016-09-02 DIAGNOSIS — M6281 Muscle weakness (generalized): Secondary | ICD-10-CM | POA: Diagnosis not present

## 2016-09-02 DIAGNOSIS — M7989 Other specified soft tissue disorders: Secondary | ICD-10-CM | POA: Diagnosis not present

## 2016-09-02 DIAGNOSIS — N184 Chronic kidney disease, stage 4 (severe): Secondary | ICD-10-CM | POA: Diagnosis not present

## 2016-09-02 DIAGNOSIS — E1129 Type 2 diabetes mellitus with other diabetic kidney complication: Secondary | ICD-10-CM | POA: Diagnosis not present

## 2016-09-02 DIAGNOSIS — I10 Essential (primary) hypertension: Secondary | ICD-10-CM | POA: Diagnosis not present

## 2016-09-02 DIAGNOSIS — D631 Anemia in chronic kidney disease: Secondary | ICD-10-CM | POA: Diagnosis not present

## 2016-09-02 DIAGNOSIS — I509 Heart failure, unspecified: Secondary | ICD-10-CM | POA: Diagnosis not present

## 2016-09-02 DIAGNOSIS — I5043 Acute on chronic combined systolic (congestive) and diastolic (congestive) heart failure: Secondary | ICD-10-CM | POA: Diagnosis not present

## 2016-09-02 DIAGNOSIS — Z7901 Long term (current) use of anticoagulants: Secondary | ICD-10-CM | POA: Diagnosis not present

## 2016-09-02 DIAGNOSIS — I872 Venous insufficiency (chronic) (peripheral): Secondary | ICD-10-CM | POA: Diagnosis not present

## 2016-09-02 DIAGNOSIS — E785 Hyperlipidemia, unspecified: Secondary | ICD-10-CM | POA: Diagnosis not present

## 2016-09-02 DIAGNOSIS — E1121 Type 2 diabetes mellitus with diabetic nephropathy: Secondary | ICD-10-CM | POA: Diagnosis not present

## 2016-09-02 DIAGNOSIS — M79671 Pain in right foot: Secondary | ICD-10-CM | POA: Diagnosis not present

## 2016-09-02 DIAGNOSIS — I200 Unstable angina: Secondary | ICD-10-CM | POA: Diagnosis not present

## 2016-09-02 DIAGNOSIS — I481 Persistent atrial fibrillation: Secondary | ICD-10-CM | POA: Diagnosis not present

## 2016-09-02 DIAGNOSIS — D649 Anemia, unspecified: Secondary | ICD-10-CM | POA: Diagnosis not present

## 2016-09-02 DIAGNOSIS — I361 Nonrheumatic tricuspid (valve) insufficiency: Secondary | ICD-10-CM | POA: Diagnosis not present

## 2016-09-02 DIAGNOSIS — N390 Urinary tract infection, site not specified: Secondary | ICD-10-CM | POA: Diagnosis not present

## 2016-09-02 LAB — RENAL FUNCTION PANEL
ALBUMIN: 2.5 g/dL — AB (ref 3.5–5.0)
ANION GAP: 8 (ref 5–15)
BUN: 58 mg/dL — AB (ref 6–20)
CHLORIDE: 100 mmol/L — AB (ref 101–111)
CO2: 28 mmol/L (ref 22–32)
Calcium: 8.3 mg/dL — ABNORMAL LOW (ref 8.9–10.3)
Creatinine, Ser: 2.19 mg/dL — ABNORMAL HIGH (ref 0.44–1.00)
GFR calc Af Amer: 23 mL/min — ABNORMAL LOW (ref >60)
GFR calc non Af Amer: 20 mL/min — ABNORMAL LOW (ref >60)
GLUCOSE: 129 mg/dL — AB (ref 65–99)
POTASSIUM: 4.1 mmol/L (ref 3.5–5.1)
Phosphorus: 2.9 mg/dL (ref 2.5–4.6)
Sodium: 136 mmol/L (ref 135–145)

## 2016-09-02 LAB — GLUCOSE, CAPILLARY
GLUCOSE-CAPILLARY: 142 mg/dL — AB (ref 65–99)
GLUCOSE-CAPILLARY: 157 mg/dL — AB (ref 65–99)

## 2016-09-02 LAB — PROTIME-INR
INR: 2.34
Prothrombin Time: 26 seconds — ABNORMAL HIGH (ref 11.4–15.2)

## 2016-09-02 MED ORDER — HYDROCODONE-ACETAMINOPHEN 5-325 MG PO TABS: 1.0000 | ORAL_TABLET | Freq: Four times a day (QID) | ORAL | 0 refills | Status: AC | PRN

## 2016-09-02 MED ORDER — PRIMIDONE 50 MG PO TABS: 50.0000 mg | ORAL_TABLET | Freq: Every day | ORAL | Status: AC

## 2016-09-02 MED ORDER — BISACODYL 10 MG RE SUPP: 10.0000 mg | Freq: Every day | RECTAL | 0 refills | Status: AC | PRN

## 2016-09-02 MED ORDER — TORSEMIDE 20 MG PO TABS: 20.0000 mg | ORAL_TABLET | Freq: Every day | ORAL | Status: AC

## 2016-09-02 MED ORDER — ALBUTEROL SULFATE (2.5 MG/3ML) 0.083% IN NEBU: 2.5000 mg | INHALATION_SOLUTION | Freq: Four times a day (QID) | RESPIRATORY_TRACT | 12 refills | Status: AC | PRN

## 2016-09-02 MED ORDER — AMLODIPINE BESYLATE 5 MG PO TABS: 5.0000 mg | ORAL_TABLET | Freq: Every day | ORAL | 11 refills | Status: AC

## 2016-09-02 MED ORDER — INSULIN ASPART 100 UNIT/ML ~~LOC~~ SOLN: 4.0000 [IU] | Freq: Three times a day (TID) | SUBCUTANEOUS | 11 refills | Status: AC

## 2016-09-02 MED ORDER — INSULIN ASPART 100 UNIT/ML ~~LOC~~ SOLN: 0.0000 [IU] | Freq: Three times a day (TID) | SUBCUTANEOUS | 11 refills | Status: AC

## 2016-09-02 MED ORDER — BISACODYL 5 MG PO TBEC: 5.0000 mg | DELAYED_RELEASE_TABLET | Freq: Every day | ORAL | 0 refills | Status: AC | PRN

## 2016-09-02 MED ORDER — VITAMIN D (ERGOCALCIFEROL) 1.25 MG (50000 UNIT) PO CAPS: 50000.0000 [IU] | ORAL_CAPSULE | ORAL | Status: AC

## 2016-09-02 MED ORDER — INSULIN ASPART 100 UNIT/ML ~~LOC~~ SOLN: 0.0000 [IU] | Freq: Every day | SUBCUTANEOUS | 11 refills | Status: AC

## 2016-09-02 MED ORDER — SODIUM CHLORIDE 0.9 % IV SOLN: 510.0000 mg | INTRAVENOUS | Status: AC

## 2016-09-02 NOTE — Clinical Social Work Placement (Addendum)
   CLINICAL SOCIAL WORK PLACEMENT  NOTE  Date:  09/02/2016  Patient Details  Name: Melissa [name] MRN: [id number] Date of Birth: 09/29/1930  Clinical Social Work is seeking post-discharge placement for this patient at the Skilled  Nursing Facility level of care (*CSW will initial, date and re-position this form in  chart as items are completed):  Yes   Patient/family provided with [facility] Clinical Social Work Department's list of facilities offering this level of care within the geographic area requested by the patient (or if unable, by the patient's family).  Yes   Patient/family informed of their freedom to choose among providers that offer the needed level of care, that participate in Medicare, Medicaid or managed care program needed by the patient, have an available bed and are willing to accept the patient.  Yes   Patient/family informed of [facility]'s ownership interest in [facility], as well as of the fact that they are under no obligation to receive care at these facilities.  PASRR submitted to EDS on 08/30/16     PASRR number received on 08/30/16     Existing PASRR number confirmed on       FL2 transmitted to all facilities in geographic area requested by pt/family on 08/30/16     FL2 transmitted to all facilities within larger geographic area on       Patient informed that his/her managed care company has contracts with or will negotiate with certain facilities, including the following:            Patient/family informed of bed offers received.  [facility]     Physician recommends and patient chooses bed at      [facility] to [facility] on 09/02/16.  [facility]     Patient family notified on 09/02/16 of transfer.  Name of family member notified:  [name], daughter     PHYSICIAN       Additional Comment:  [facility] [employer]. LCSW signing off.  [facility].  _______________________________________________ Tretha SciaraSettle, Rmani Kellogg D, LCSW 09/02/2016, 9:30 AM

## 2016-09-02 NOTE — Discharge Summary (Signed)
Physician Discharge Summary  Patient ID: Melissa [name] MRN: [id number] DOB/AGE: 13-Nov-1930 83 y.o. Primary Care Physician:Brax Walen, Ramon Dredge, MD Admit date: 08/18/2016 Discharge date: 09/02/2016    Discharge Diagnoses:   Principal Problem:   Acute on chronic diastolic CHF (congestive heart failure) (HCC) Active Problems:   Essential hypertension, benign   Persistent atrial fibrillation (HCC)   Long term current use of anticoagulant   Chronic kidney disease (CKD), stage IV (severe) (HCC)   Status post coronary artery stent placement   Diabetes mellitus type 2, controlled (HCC)   Acute on chronic diastolic (congestive) heart failure (HCC)   Pressure injury of skin   UTI (urinary tract infection) Stage II sacral decubitus Urinary tract infection  Allergies as of 09/02/2016   No Known Allergies     Medication List    STOP taking these medications   glimepiride 2 MG tablet Commonly known as:  AMARYL   insulin detemir 100 UNIT/ML injection Commonly known as:  LEVEMIR   insulin lispro 100 UNIT/ML injection Commonly known as:  HUMALOG     TAKE these medications   acetaminophen 325 MG tablet Commonly known as:  TYLENOL Take 2 tablets (650 mg total) by mouth every 4 (four) hours as needed for mild pain, moderate pain, fever or headache.   ALPRAZolam 0.25 MG tablet Commonly known as:  XANAX Take 0.25 mg by mouth 3 (three) times daily as needed.   amLODipine 5 MG tablet Commonly known as:  NORVASC Take 1 tablet (5 mg total) by mouth daily. What changed:  medication strength  See the new instructions.   atorvastatin 40 MG tablet Commonly known as:  LIPITOR Take 1 tablet (40 mg total) by mouth daily.   bisacodyl 5 MG EC tablet Commonly known as:  DULCOLAX Take 1 tablet (5 mg total) by mouth daily as needed for moderate constipation.   bisacodyl 10 MG suppository Commonly known as:  DULCOLAX Place 1 suppository (10 mg total) rectally daily as needed for moderate  constipation.   clopidogrel 75 MG tablet Commonly known as:  PLAVIX Take 1 tablet (75 mg total) by mouth daily.   ferumoxytol 510 mg in sodium chloride 0.9 % 100 mL Inject 510 mg into the vein once a week.   hydrALAZINE 100 MG tablet Commonly known as:  APRESOLINE Take 1 tablet (100 mg total) by mouth 3 (three) times daily.   HYDROcodone-acetaminophen 5-325 MG tablet Commonly known as:  NORCO/VICODIN Take 1 tablet by mouth 4 (four) times daily.   INCRUSE ELLIPTA 62.5 MCG/INH Aepb Generic drug:  umeclidinium bromide Inhale 1 puff into the lungs daily.   insulin aspart 100 UNIT/ML injection Commonly known as:  novoLOG Inject 0-15 Units into the skin 3 (three) times daily with meals.   insulin aspart 100 UNIT/ML injection Commonly known as:  novoLOG Inject 0-5 Units into the skin at bedtime.   insulin aspart 100 UNIT/ML injection Commonly known as:  novoLOG Inject 4 Units into the skin 3 (three) times daily with meals.   isosorbide mononitrate 60 MG 24 hr tablet Commonly known as:  IMDUR Take 1 tablet (60 mg total) by mouth daily.   nitroGLYCERIN 0.4 MG SL tablet Commonly known as:  NITROSTAT Place 1 tablet (0.4 mg total) under the tongue every 5 (five) minutes as needed for chest pain.   pantoprazole 40 MG tablet Commonly known as:  PROTONIX Take 1 tablet (40 mg total) by mouth daily at 6 (six) AM.   primidone 50 MG tablet Commonly known as:  MYSOLINE Take 1 tablet (50 mg total) by mouth daily.   PROAIR HFA 108 (90 Base) MCG/ACT inhaler Generic drug:  albuterol Inhale 1-2 puffs into the lungs 4 (four) times daily. What changed:  Another medication with the same name was added. Make sure you understand how and when to take each.   albuterol (2.5 MG/3ML) 0.083% nebulizer solution Commonly known as:  PROVENTIL Take 3 mLs (2.5 mg total) by nebulization every 6 (six) hours as needed for wheezing or shortness of breath. What changed:  You were already taking a  medication with the same name, and this prescription was added. Make sure you understand how and when to take each.   torsemide 20 MG tablet Commonly known as:  DEMADEX Take 1 tablet (20 mg total) by mouth daily. What changed:  how much to take  when to take this   TRAVATAN Z 0.004 % Soln ophthalmic solution Generic drug:  Travoprost (BAK Free) Place 1 drop into both eyes at bedtime.   Vitamin D (Ergocalciferol) 50000 units Caps capsule Commonly known as:  DRISDOL Take 1 capsule (50,000 Units total) by mouth every 7 (seven) days.   warfarin 5 MG tablet Commonly known as:  COUMADIN TAKE 1 TABLET BY MOUTH DAILY EXCEPT 1/2 TABLET ON WEDNESDAYS AND SATURDAYS.       Discharged Condition:Improved    Consults: Cardiology/nephrology  Significant Diagnostic Studies: Dg Wrist Complete Right  Result Date: 08/30/2016 CLINICAL DATA:  Right wrist pain. EXAM: RIGHT WRIST - COMPLETE 3+ VIEW COMPARISON:  07/20/2016 FINDINGS: The patient has arthritis between the lunate, triquetrum, and hamate with cystic degenerative changes and sclerosis in the proximal pole of the hamate. There is slight irregularity of the triquetrum without a discrete fracture. There is chondrocalcinosis at the radiocarpal joint. There is moderate arthritis of the first carpometacarpal joint. IMPRESSION: 1. No acute abnormality. 2. Arthritic changes in the midcarpal joint. 3. Chondrocalcinosis of the radiocarpal joint. 4. Osteoarthritis of the first carpometacarpal joint. 5. No discrete fracture of the triquetrum. Electronically Signed   By: Francene BoyersJames  Maxwell M.D.   On: 08/30/2016 09:27   [place] Renal  Result Date: 08/30/2016 CLINICAL DATA:  Acute on chronic renal failure history of diabetes, hypertension, and coronary artery disease. EXAM: RENAL / URINARY TRACT ULTRASOUND COMPLETE COMPARISON:  None in PACs FINDINGS: Right Kidney: Length: 11.1 cm. The renal cortical echotexture is increased and is greater than that of the liver.  There is no hydronephrosis. There are cystic structures in the inferior aspect of the right kidney. One measures 3.6 cm in greatest dimension in the other 2 cm in greatest dimension. Left Kidney: Length: 9 cm. The renal cortical echotexture is increased similar to that on the right. There is a midpole cortical cyst measuring 2.5 cm in greatest dimension. There is no hydronephrosis. Bladder: The partially distended urinary bladder is normal. IMPRESSION: Increased renal cortical echotexture bilaterally consistent with medical renal disease. There is no hydronephrosis. There are simple appearing cysts in both kidneys. The urinary bladder is unremarkable where visualized. Electronically Signed   By: David  SwazilandJordan M.D.   On: 08/30/2016 16:00   [place] Venous Img Lower Unilateral Right  Result Date: 08/26/2016 CLINICAL DATA:  83 year old female with history of right lower extremity pain. Chronic venous insufficiency. EXAM: RIGHT LOWER EXTREMITY VENOUS DOPPLER ULTRASOUND TECHNIQUE: Gray-scale sonography with graded compression, as well as color Doppler and duplex ultrasound were performed to evaluate the lower extremity deep venous systems from the level of the common femoral vein and including  the common femoral, femoral, profunda femoral, popliteal and calf veins including the posterior tibial, peroneal and gastrocnemius veins when visible. The superficial great saphenous vein was also interrogated. Spectral Doppler was utilized to evaluate flow at rest and with distal augmentation maneuvers in the common femoral, femoral and popliteal veins. COMPARISON:  None. FINDINGS: Contralateral Common Femoral Vein: Respiratory phasicity is normal and symmetric with the symptomatic side. No evidence of thrombus. Normal compressibility. Common Femoral Vein: No evidence of thrombus. Normal compressibility, respiratory phasicity and response to augmentation. Saphenofemoral Junction: No evidence of thrombus. Normal compressibility and  flow on color Doppler imaging. Profunda Femoral Vein: No evidence of thrombus. Normal compressibility and flow on color Doppler imaging. Femoral Vein: No evidence of thrombus. Normal compressibility, respiratory phasicity and response to augmentation. Popliteal Vein: No evidence of thrombus. Normal compressibility, respiratory phasicity and response to augmentation. Calf Veins: No evidence of thrombus. Normal compressibility and flow on color Doppler imaging. Superficial Great Saphenous Vein: No evidence of thrombus. Normal compressibility and flow on color Doppler imaging. Venous Reflux:  None. Other Findings: In the left groin there is a 4.9 x 2.8 x 5.3 cm lesion that is predominantly anechoic with increased through transmission, however, there appears to be some complex internal echoes with an appearance suggestive of a pedunculated soft tissue component. On some images, there is a suggestion of internal flow, however, after discussion with sonographer, this is favored to be artifactual. IMPRESSION: 1. No evidence of DVT within the right lower extremity. 2. Complex cystic lesion in the left groin, as discussed above. This is of uncertain etiology and significance, but is favored to represent either a a complex seroma, old chronic complex hematoma, or left inguinal or femoral hernia containing fluid. Electronically Signed   By: Trudie Reedaniel  Entrikin M.D.   On: 08/26/2016 08:53   Dg Chest Port 1 View  Result Date: 08/22/2016 CLINICAL DATA:  PICC line placement. EXAM: PORTABLE CHEST 1 VIEW COMPARISON:  08/18/2016 and 07/20/2016 radiographs.  CT 11/27/2015. FINDINGS: 1403 hour. New right arm PICC extends to the superior cavoatrial junction. There is stable cardiomegaly and aortic atherosclerosis. The lungs are clear. There is no pleural effusion or pneumothorax. IMPRESSION: No pneumothorax post PICC line placement. Tip of the line extends to the level of the superior cavoatrial junction. Stable cardiomegaly.  Electronically Signed   By: Carey BullocksWilliam  Veazey M.D.   On: 08/22/2016 14:19   Dg Chest Portable 1 View  Result Date: 08/18/2016 CLINICAL DATA:  83 year old female with shortness of breath. EXAM: PORTABLE CHEST 1 VIEW COMPARISON:  Chest radiograph dated 07/20/2016 FINDINGS: There is moderate cardiomegaly with interval increase in the size of the cardiopericardial silhouette compared to the prior study. A pericardial effusion is not excluded. There is mild central vascular and interstitial prominence. Bilateral lower lung field platelike atelectasis or scarring noted. No focal consolidation, pleural effusion, or pneumothorax. There is atherosclerotic calcification of the thoracic aorta. No acute osseous pathology. IMPRESSION: 1. Moderate cardiomegaly with interval increase in the size of the cardiac silhouette. A pericardial effusion is not excluded. 2. Mild vascular congestion.  No focal consolidation. Electronically Signed   By: Elgie CollardArash  Radparvar M.D.   On: 08/18/2016 21:38    Lab Results: Basic Metabolic Panel:  Recent Labs  60/45/4007/26/18 0509 09/02/16 0558  NA 135 136  K 4.3 4.1  CL 99* 100*  CO2 28 28  GLUCOSE 152* 129*  BUN 61* 58*  CREATININE 2.29* 2.19*  CALCIUM 8.2* 8.3*  PHOS  --  2.9  Liver Function Tests:  Recent Labs  09/02/16 0558  ALBUMIN 2.5*     CBC:  Recent Labs  09/01/16 0509  WBC 8.9  HGB 7.5*  HCT 23.5*  MCV 97.1  PLT 193    Recent Results (from the past 240 hour(s))  Urine Culture     Status: Abnormal   Collection Time: 08/26/16 10:43 AM  Result Value Ref Range Status   Specimen Description URINE, CATHETERIZED  Final   Special Requests NONE  Final   Culture (A)  Final    >=100,000 COLONIES/mL KLEBSIELLA PNEUMONIAE 50,000 COLONIES/mL GROUP B STREP(S.AGALACTIAE)ISOLATED TESTING AGAINST S. AGALACTIAE NOT ROUTINELY PERFORMED DUE TO PREDICTABILITY OF AMP/PEN/VAN SUSCEPTIBILITY. Performed at [facility], [address], [place], [place] [address]     Report Status 08/28/2016 FINAL  Final   Organism ID, Bacteria KLEBSIELLA PNEUMONIAE (A)  Final      Susceptibility   Klebsiella pneumoniae - MIC*    AMPICILLIN RESISTANT Resistant     CEFAZOLIN <=4 SENSITIVE Sensitive     CEFTRIAXONE <=1 SENSITIVE Sensitive     CIPROFLOXACIN <=0.25 SENSITIVE Sensitive     GENTAMICIN <=1 SENSITIVE Sensitive     IMIPENEM <=0.25 SENSITIVE Sensitive     NITROFURANTOIN 32 SENSITIVE Sensitive     TRIMETH/SULFA <=20 SENSITIVE Sensitive     AMPICILLIN/SULBACTAM 4 SENSITIVE Sensitive     PIP/TAZO <=4 SENSITIVE Sensitive     Extended ESBL NEGATIVE Sensitive     * >=100,000 COLONIES/mL KLEBSIELLA PNEUMONIAE     Hospital Course: This is a 83 year old who came to the emergency department because of shortness of breath. She is known to have congestive heart failure which is mostly diastolic in nature. She was up about 20 pounds from baseline. She had fluid all the way up into her hips. She was treated with intravenous diuresis and improved. Her situation was complicated by a urinary tract infection and by hypoglycemia. Her insulin was adjusted. She was treated for the urinary tract infection. She is known to have chronic renal failure stage IV and that got worse during her hospitalization but now has stabilized. She had PT evaluation it was felt that she was going to need skilled care facility placement for rehabilitation which I think is appropriate. During her hospitalization we had discussion about CODE STATUS and she has elected DO NOT RESUSCITATE status which I think is also appropriate.  Discharge Exam: Blood pressure 118/63, pulse 63, temperature 98.6 F (37 C), temperature source Oral, resp. rate 16, height 5\' 4"  (1.626 m), weight 96.6 kg (212 lb 15.4 oz), SpO2 96 %. She is awake and alert. Very hard of hearing. She still has swelling of her legs. This is better. Her heart is irregular as always.  Disposition: To skilled care facility for PT OT and speech as  needed. She will need to be on oxygen at 2 L/m. She will need basic metabolic profile on 09/05/2016 and then weekly after that. She will need PT/INR daily for 5 days. Call to our on-call physician on the 28th and 29th and to the [facility] Coumadin clinic starting on the 30th. She will be on heart healthy carb modified diet. She will be on sliding scale insulin. She is off her basal insulin at least for now. She will need Accu-Cheks before meals and at bedtime. The hydrocodone is listed as scheduled but should be 4 times a day when necessary for pain  Discharge Instructions    AMB Referral to [facility] Care Management  Complete by:  As directed    Please assign patient for community nurse to engage for transition of care calls and evaluate for monthly home visits. For questions please contact:   Janci Minor RN, BSN  [facility] Liaison [phone])   Reason for consult:  Post hospital discharge follow up from a RN Community Case Manager   Diagnoses of:   Diabetes Heart Failure     Expected date of contact:  1-3 days (reserved for hospital discharges)   Diet - low sodium heart healthy    Complete by:  As directed    Increase activity slowly    Complete by:  As directed       Contact information for after-discharge care    Destination    [facility] .   Specialty:  Skilled Nursing Facility Contact information: [address] [place] [address] [phone]              Signed: Fredirick Maudlin   09/02/2016, 8:52 AM

## 2016-09-02 NOTE — Progress Notes (Signed)
Subjective: Interval History:  patient is feeling much better. Patient denies any nausea or vomiting.  Objective: Vital signs in last 24 hours: Temp:  [98.6 F (37 C)-99.3 F (37.4 C)] 98.6 F (37 C) (07/27 0528) Pulse Rate:  [63-65] 63 (07/27 0528) Resp:  [16-18] 16 (07/27 0528) BP: (115-118)/(57-63) 118/63 (07/27 0528) SpO2:  [96 %-100 %] 96 % (07/27 0740) Weight:  [96.6 kg (212 lb 15.4 oz)] 96.6 kg (212 lb 15.4 oz) (07/27 0528) Weight change: -0.198 kg (-7 oz)  Intake/Output from previous day: 07/26 0701 - 07/27 0700 In: 723 [P.O.:720; I.V.:3] Out: 700 [Urine:700] Intake/Output this shift: No intake/output data recorded.  Generally patient is alert and in no apparent distress She has elevated JVD Chest decreased breath sounds bilaterally Heart exam: irevealed regular rate and rhythm Extremities: She has 1+ edema  Lab Results:  Recent Labs  09/01/16 0509  WBC 8.9  HGB 7.5*  HCT 23.5*  PLT 193   BMET:   Recent Labs  09/01/16 0509 09/02/16 0558  NA 135 136  K 4.3 4.1  CL 99* 100*  CO2 28 28  GLUCOSE 152* 129*  BUN 61* 58*  CREATININE 2.29* 2.19*  CALCIUM 8.2* 8.3*   No results for input(s): PTH in the last 72 hours. Iron Studies:   Recent Labs  08/31/16 0609  IRON 32  TIBC 265  FERRITIN 88    Studies/Results: No results found.  I have reviewed the patient's current medications.  Assessment/Plan: Problem #1 renal failure: Possibly acute on chronic. Her Renal function continued to improve. Presently she is asymptomatic. Problem #2 CHF: Patient denies any difficulty breathing and seems to be feeling better. She started on Demadex and she has 700 mL of urine output. Patient is asymptomatic but has some edema. Problem #3 anemia: Her hemoglobin is low. Her iron saturation is 12% and ferritin of 88. Patient has received a dose of IVIG and yesterday. She will get another dose in a week. Problem #4 Bone and mineral disorder: Her calcium and phosphorus  is range. Patient was very low vitamin D level of 4. Problem #5 Atrial fibrillation: Her heart rate is controlled Problem #6 hypertension: Her blood pressure is reasonably controlled  Plan: 1] We'll continue with Demadex 2] we'll start her on ergocalciferol 50,000 units by mouth once a week 3] we'll check her renal panel in the morning   LOS: 14 days   Larayne Baxley S 09/02/2016,8:35 AM

## 2016-09-02 NOTE — Progress Notes (Signed)
Subjective: She says she feels okay. No new complaints. She is still very weak.  Objective: Vital signs in last 24 hours: Temp:  [98.6 F (37 C)-99.3 F (37.4 C)] 98.6 F (37 C) (07/27 0528) Pulse Rate:  [63-65] 63 (07/27 0528) Resp:  [16-18] 16 (07/27 0528) BP: (115-118)/(57-63) 118/63 (07/27 0528) SpO2:  [96 %-100 %] 96 % (07/27 0740) Weight:  [96.6 kg (212 lb 15.4 oz)] 96.6 kg (212 lb 15.4 oz) (07/27 0528) Weight change: -0.198 kg (-7 oz) Last BM Date: 09/01/16  Intake/Output from previous day: 07/26 0701 - 07/27 0700 In: 723 [P.O.:720; I.V.:3] Out: 700 [Urine:700]  PHYSICAL EXAM General appearance: alert, cooperative and no distress Resp: clear to auscultation bilaterally Cardio: irregularly irregular rhythm GI: soft, non-tender; bowel sounds normal; no masses,  no organomegaly Extremities: She still has edema of her arms and legs but it is significantly better than on admission She is very hard of hearing.  Lab Results:  Results for orders placed or performed during the hospital encounter of 08/18/16 (from the past 48 hour(s))  Glucose, capillary     Status: Abnormal   Collection Time: 08/31/16 11:02 AM  Result Value Ref Range   Glucose-Capillary 143 (H) 65 - 99 mg/dL  Glucose, capillary     Status: None   Collection Time: 08/31/16  4:13 PM  Result Value Ref Range   Glucose-Capillary 99 65 - 99 mg/dL  Glucose, capillary     Status: Abnormal   Collection Time: 08/31/16  9:12 PM  Result Value Ref Range   Glucose-Capillary 148 (H) 65 - 99 mg/dL   Comment 1 Notify RN    Comment 2 Document in Chart   Protime-INR     Status: Abnormal   Collection Time: 09/01/16  5:09 AM  Result Value Ref Range   Prothrombin Time 22.3 (H) 11.4 - 15.2 seconds   INR 1.92   CBC     Status: Abnormal   Collection Time: 09/01/16  5:09 AM  Result Value Ref Range   WBC 8.9 4.0 - 10.5 K/uL   RBC 2.42 ([name]) 3.87 - 5.11 MIL/uL   Hemoglobin 7.5 ([name]) 12.0 - 15.0 g/dL   HCT 23.5 ([name]) 36.0 - 46.0  %   MCV 97.1 78.0 - 100.0 fL   MCH 31.0 26.0 - 34.0 pg   MCHC 31.9 30.0 - 36.0 g/dL   RDW 15.1 11.5 - 15.5 %   Platelets 193 150 - 400 K/uL  Basic metabolic panel     Status: Abnormal   Collection Time: 09/01/16  5:09 AM  Result Value Ref Range   Sodium 135 135 - 145 mmol/[name]   Potassium 4.3 3.5 - 5.1 mmol/[name]   Chloride 99 ([name]) 101 - 111 mmol/[name]   CO2 28 22 - 32 mmol/[name]   Glucose, Bld 152 (H) 65 - 99 mg/dL   BUN 61 (H) 6 - 20 mg/dL   Creatinine, Ser 2.29 (H) 0.44 - 1.00 mg/dL   Calcium 8.2 ([name]) 8.9 - 10.3 mg/dL   GFR calc non Af Amer 19 ([name]) >60 mL/min   GFR calc Af Amer 22 ([name]) >60 mL/min    Comment: (NOTE) The eGFR has been calculated using the CKD EPI equation. This calculation has not been validated in all clinical situations. eGFR's persistently <60 mL/min signify possible Chronic Kidney Disease.    Anion gap 8 5 - 15  Glucose, capillary     Status: Abnormal   Collection Time: 09/01/16  7:24 AM  Result Value Ref  Range   Glucose-Capillary 149 (H) 65 - 99 mg/dL  Glucose, capillary     Status: Abnormal   Collection Time: 09/01/16 11:18 AM  Result Value Ref Range   Glucose-Capillary 164 (H) 65 - 99 mg/dL  Glucose, capillary     Status: Abnormal   Collection Time: 09/01/16  4:23 PM  Result Value Ref Range   Glucose-Capillary 156 (H) 65 - 99 mg/dL  Glucose, capillary     Status: Abnormal   Collection Time: 09/01/16 10:46 PM  Result Value Ref Range   Glucose-Capillary 131 (H) 65 - 99 mg/dL   Comment 1 Notify RN    Comment 2 Document in Chart   Protime-INR     Status: Abnormal   Collection Time: 09/02/16  5:58 AM  Result Value Ref Range   Prothrombin Time 26.0 (H) 11.4 - 15.2 seconds   INR 2.34   Renal function panel     Status: Abnormal   Collection Time: 09/02/16  5:58 AM  Result Value Ref Range   Sodium 136 135 - 145 mmol/[name]   Potassium 4.1 3.5 - 5.1 mmol/[name]   Chloride 100 ([name]) 101 - 111 mmol/[name]   CO2 28 22 - 32 mmol/[name]   Glucose, Bld 129 (H) 65 - 99 mg/dL   BUN 58 (H) 6 -  20 mg/dL   Creatinine, Ser 2.19 (H) 0.44 - 1.00 mg/dL   Calcium 8.3 ([name]) 8.9 - 10.3 mg/dL   Phosphorus 2.9 2.5 - 4.6 mg/dL   Albumin 2.5 ([name]) 3.5 - 5.0 g/dL   GFR calc non Af Amer 20 ([name]) >60 mL/min   GFR calc Af Amer 23 ([name]) >60 mL/min    Comment: (NOTE) The eGFR has been calculated using the CKD EPI equation. This calculation has not been validated in all clinical situations. eGFR's persistently <60 mL/min signify possible Chronic Kidney Disease.    Anion gap 8 5 - 15  Glucose, capillary     Status: Abnormal   Collection Time: 09/02/16  7:43 AM  Result Value Ref Range   Glucose-Capillary 142 (H) 65 - 99 mg/dL   Comment 1 Notify RN    Comment 2 Document in Chart     ABGS No results for input(s): PHART, PO2ART, TCO2, HCO3 in the last 72 hours.  Invalid input(s): PCO2 CULTURES Recent Results (from the past 240 hour(s))  Urine Culture     Status: Abnormal   Collection Time: 08/26/16 10:43 AM  Result Value Ref Range Status   Specimen Description URINE, CATHETERIZED  Final   Special Requests NONE  Final   Culture (A)  Final    >=100,000 COLONIES/mL KLEBSIELLA PNEUMONIAE 50,000 COLONIES/mL GROUP B STREP(S.AGALACTIAE)ISOLATED TESTING AGAINST S. AGALACTIAE NOT ROUTINELY PERFORMED DUE TO PREDICTABILITY OF AMP/PEN/VAN SUSCEPTIBILITY. Performed at [facility], [place] [address], [place], [place] [address]    Report Status 08/28/2016 FINAL  Final   Organism ID, Bacteria KLEBSIELLA PNEUMONIAE (A)  Final      Susceptibility   Klebsiella pneumoniae - MIC*    AMPICILLIN RESISTANT Resistant     CEFAZOLIN <=4 SENSITIVE Sensitive     CEFTRIAXONE <=1 SENSITIVE Sensitive     CIPROFLOXACIN <=0.25 SENSITIVE Sensitive     GENTAMICIN <=1 SENSITIVE Sensitive     IMIPENEM <=0.25 SENSITIVE Sensitive     NITROFURANTOIN 32 SENSITIVE Sensitive     TRIMETH/SULFA <=20 SENSITIVE Sensitive     AMPICILLIN/SULBACTAM 4 SENSITIVE Sensitive     PIP/TAZO <=4 SENSITIVE Sensitive     Extended ESBL  NEGATIVE Sensitive     * >=100,000 COLONIES/mL KLEBSIELLA PNEUMONIAE   Studies/Results: No results found.  Medications:  Prior to Admission:  Prescriptions Prior to Admission  Medication Sig Dispense Refill Last Dose  . acetaminophen (TYLENOL) 325 MG tablet Take 2 tablets (650 mg total) by mouth every 4 (four) hours as needed for mild pain, moderate pain, fever or headache.   unknown  . ALPRAZolam (XANAX) 0.25 MG tablet Take 0.25 mg by mouth 3 (three) times daily as needed.   unknown  . amLODipine (NORVASC) 2.5 MG tablet TAKE (1) TABLET BY MOUTH DAILY. 30 tablet 6 08/18/2016 at Unknown time  . atorvastatin (LIPITOR) 40 MG tablet Take 1 tablet (40 mg total) by mouth daily. 90 tablet 3 08/18/2016 at Unknown time  . clopidogrel (PLAVIX) 75 MG tablet Take 1 tablet (75 mg total) by mouth daily. 90 tablet 3 08/18/2016 at Unknown time  . glimepiride (AMARYL) 2 MG tablet Take 2 mg by mouth daily.     08/18/2016 at Unknown time  . hydrALAZINE (APRESOLINE) 100 MG tablet Take 1 tablet (100 mg total) by mouth 3 (three) times daily. 270 tablet 3 08/18/2016 at Unknown time  . HYDROcodone-acetaminophen (NORCO/VICODIN) 5-325 MG tablet Take 1 tablet by mouth 4 (four) times daily.    08/18/2016 at Unknown time  . insulin detemir (LEVEMIR) 100 UNIT/ML injection Inject 0.2 mLs (20 Units total) into the skin at bedtime. 10 mL 11 08/17/2016 at Unknown time  . insulin lispro (HUMALOG) 100 UNIT/ML injection Inject 0-0.15 mLs (0-15 Units total) into the skin 3 (three) times daily with meals. Use as sliding scale 10 mL 11 08/18/2016 at Unknown time  . isosorbide mononitrate (IMDUR) 60 MG 24 hr tablet Take 1 tablet (60 mg total) by mouth daily. 90 tablet 3 08/18/2016 at Unknown time  . nitroGLYCERIN (NITROSTAT) 0.4 MG SL tablet Place 1 tablet (0.4 mg total) under the tongue every 5 (five) minutes as needed for chest pain. 25 tablet 2 unknown  . pantoprazole (PROTONIX) 40 MG tablet Take 1 tablet (40 mg total) by mouth daily at 6  (six) AM. 90 tablet 3 08/18/2016 at Unknown time  . PROAIR HFA 108 (90 Base) MCG/ACT inhaler Inhale 1-2 puffs into the lungs 4 (four) times daily.    08/18/2016 at Unknown time  . torsemide (DEMADEX) 20 MG tablet Take 2 tablets (40 mg total) by mouth 2 (two) times daily. 120 tablet 6 08/18/2016 at Unknown time  . TRAVATAN Z 0.004 % SOLN ophthalmic solution Place 1 drop into both eyes at bedtime.    08/17/2016 at Unknown time  . umeclidinium bromide (INCRUSE ELLIPTA) 62.5 MCG/INH AEPB Inhale 1 puff into the lungs daily.   08/18/2016 at Unknown time  . warfarin (COUMADIN) 5 MG tablet TAKE 1 TABLET BY MOUTH DAILY EXCEPT 1/2 TABLET ON WEDNESDAYS AND SATURDAYS. 30 tablet 6 08/17/2016 at 2130   Scheduled: . amLODipine  10 mg Oral Daily  . atorvastatin  40 mg Oral q1800  . clopidogrel  75 mg Oral Daily  . hydrALAZINE  100 mg Oral TID  . insulin aspart  0-15 Units Subcutaneous TID WC  . insulin aspart  0-5 Units Subcutaneous QHS  . insulin aspart  4 Units Subcutaneous TID WC  . ipratropium-albuterol  3 mL Nebulization BID  . isosorbide mononitrate  90 mg Oral Daily  . latanoprost  1 drop Both Eyes QHS  . primidone  50 mg Oral Daily  . sodium chloride flush  3 mL Intravenous Q12H  .  torsemide  20 mg Oral Daily  . Vitamin D (Ergocalciferol)  50,000 Units Oral Q7 days  . Warfarin - Pharmacist Dosing Inpatient   Does not apply Q24H   Continuous: . sodium chloride    . ferumoxytol Stopped (09/01/16 1135)   SJW:TGRMBO chloride, acetaminophen, albuterol, ALPRAZolam, bisacodyl, bisacodyl, magnesium hydroxide, metoprolol tartrate, morphine injection, ondansetron (ZOFRAN) IV, sodium chloride flush  Assesment: She has acute on chronic diastolic heart failure. She has hypertension which is well controlled. She has chronic kidney disease stage IV per kidney function is actually a little bit better now. She has diabetes which has been pretty well controlled. She has coronary artery occlusive disease stable now.  She has chronic atrial fib and she is chronically anticoagulated Principal Problem:   Acute on chronic diastolic CHF (congestive heart failure) (HCC) Active Problems:   Essential hypertension, benign   Persistent atrial fibrillation ([place])   Long term current use of anticoagulant   Chronic kidney disease (CKD), stage IV (severe) (HCC)   Status post coronary artery stent placement   Diabetes mellitus type 2, controlled ([place])   Acute on chronic diastolic (congestive) heart failure (HCC)   Pressure injury of skin    Plan: She is ready for discharge to skilled care facility today    LOS: 14 days   Melissa [name] 09/02/2016, 8:44 AM

## 2016-09-02 NOTE — Care Management Important Message (Signed)
Important Message  Patient Details  Name: Melissa [name] MRN: [id number] Date of Birth: 1930/08/31   Medicare Important Message Given:  Yes    Thressa Shiffer, Chrystine OilerSharley Diane, RN 09/02/2016, 9:42 AM

## 2016-09-03 ENCOUNTER — Encounter (HOSPITAL_COMMUNITY)
Admission: RE | Admit: 2016-09-03 | Discharge: 2016-09-03 | Disposition: A | Discharge status: Home / Self Care | Payer: Medicare Other | Source: Other Acute Inpatient Hospital | Attending: Pulmonary Disease | Admitting: Pulmonary Disease

## 2016-09-03 LAB — PROTIME-INR
INR: 2.57
Prothrombin Time: 28.1 seconds — ABNORMAL HIGH (ref 11.4–15.2)

## 2016-09-04 ENCOUNTER — Encounter (HOSPITAL_COMMUNITY)
Admission: AD | Admit: 2016-09-04 | Discharge: 2016-09-04 | Disposition: A | Discharge status: Home / Self Care | Payer: Medicare Other | Source: Skilled Nursing Facility | Attending: *Deleted | Admitting: *Deleted

## 2016-09-04 LAB — PROTIME-INR
INR: 2.75
Prothrombin Time: 29.7 seconds — ABNORMAL HIGH (ref 11.4–15.2)

## 2016-09-05 ENCOUNTER — Encounter (HOSPITAL_COMMUNITY)
Admission: RE | Admit: 2016-09-05 | Discharge: 2016-09-05 | Disposition: A | Discharge status: Home / Self Care | Payer: Medicare Other | Source: Skilled Nursing Facility | Attending: *Deleted | Admitting: *Deleted

## 2016-09-05 ENCOUNTER — Other Ambulatory Visit: Payer: Self-pay | Admitting: *Deleted

## 2016-09-05 LAB — URINALYSIS, ROUTINE W REFLEX MICROSCOPIC
BACTERIA UA: NONE SEEN
BILIRUBIN URINE: NEGATIVE
Glucose, UA: NEGATIVE mg/dL
KETONES UR: NEGATIVE mg/dL
LEUKOCYTES UA: NEGATIVE
NITRITE: NEGATIVE
PH: 7 (ref 5.0–8.0)
Protein, ur: NEGATIVE mg/dL
SPECIFIC GRAVITY, URINE: 1.026 (ref 1.005–1.030)

## 2016-09-05 LAB — BASIC METABOLIC PANEL
Anion gap: 9 (ref 5–15)
BUN: 46 mg/dL — AB (ref 6–20)
CHLORIDE: 104 mmol/L (ref 101–111)
CO2: 27 mmol/L (ref 22–32)
CREATININE: 1.89 mg/dL — AB (ref 0.44–1.00)
Calcium: 8.3 mg/dL — ABNORMAL LOW (ref 8.9–10.3)
GFR calc Af Amer: 27 mL/min — ABNORMAL LOW (ref >60)
GFR calc non Af Amer: 23 mL/min — ABNORMAL LOW (ref >60)
Glucose, Bld: 130 mg/dL — ABNORMAL HIGH (ref 65–99)
POTASSIUM: 4.3 mmol/L (ref 3.5–5.1)
Sodium: 140 mmol/L (ref 135–145)

## 2016-09-05 LAB — PROTIME-INR
INR: 2.92
Prothrombin Time: 31.1 seconds — ABNORMAL HIGH (ref 11.4–15.2)

## 2016-09-05 NOTE — Patient Outreach (Signed)
[facility] Care Management  09/05/2016  Melissa [name] [birth year] [id number]  [name] has been discharged from the hospital and admitted to a skilled nursing facility to meet her ongoing care needs.   I have contacted the nursing facility admissions coordinator and Melissa [name]'s primary care provider that we have discharged Melissa [name] from care management services as she will be receiving the same services at the nursing facility but that we are available to provide assistance to Melissa [name] at any time in the future should we be able to provide assistance to her.    Melissa [name] MHA,BSN,RN,CCM [facility]  [phone]

## 2016-09-06 ENCOUNTER — Ambulatory Visit (INDEPENDENT_AMBULATORY_CARE_PROVIDER_SITE_OTHER): Payer: Medicare Other | Admitting: *Deleted

## 2016-09-06 ENCOUNTER — Encounter (HOSPITAL_COMMUNITY)
Admission: RE | Admit: 2016-09-06 | Discharge: 2016-09-06 | Disposition: A | Discharge status: Home / Self Care | Payer: Medicare Other | Source: Skilled Nursing Facility | Attending: Pulmonary Disease | Admitting: Pulmonary Disease

## 2016-09-06 DIAGNOSIS — I4819 Other persistent atrial fibrillation: Secondary | ICD-10-CM

## 2016-09-06 DIAGNOSIS — Z5181 Encounter for therapeutic drug level monitoring: Secondary | ICD-10-CM

## 2016-09-06 DIAGNOSIS — I481 Persistent atrial fibrillation: Secondary | ICD-10-CM

## 2016-09-06 LAB — PROTIME-INR
INR: 3.02
Prothrombin Time: 32 seconds — ABNORMAL HIGH (ref 11.4–15.2)

## 2016-09-07 ENCOUNTER — Ambulatory Visit (INDEPENDENT_AMBULATORY_CARE_PROVIDER_SITE_OTHER): Payer: Medicare Other | Admitting: *Deleted

## 2016-09-07 ENCOUNTER — Encounter (HOSPITAL_COMMUNITY)
Admission: RE | Admit: 2016-09-07 | Discharge: 2016-09-07 | Disposition: A | Discharge status: Home / Self Care | Payer: Medicare Other | Source: Skilled Nursing Facility | Attending: Pulmonary Disease | Admitting: Pulmonary Disease

## 2016-09-07 DIAGNOSIS — I4819 Other persistent atrial fibrillation: Secondary | ICD-10-CM

## 2016-09-07 DIAGNOSIS — Z5181 Encounter for therapeutic drug level monitoring: Secondary | ICD-10-CM

## 2016-09-07 DIAGNOSIS — I481 Persistent atrial fibrillation: Secondary | ICD-10-CM

## 2016-09-07 LAB — PROTIME-INR
INR: 3.22
Prothrombin Time: 33.6 seconds — ABNORMAL HIGH (ref 11.4–15.2)

## 2016-09-09 ENCOUNTER — Encounter (HOSPITAL_COMMUNITY)
Admit: 2016-09-09 | Discharge: 2016-09-09 | Disposition: A | Discharge status: Home / Self Care | Payer: Medicare Other | Attending: Pulmonary Disease | Admitting: Pulmonary Disease

## 2016-09-09 MED ORDER — HEPARIN SOD (PORK) LOCK FLUSH 100 UNIT/ML IV SOLN
250.0000 [IU] | INTRAVENOUS | Status: AC | PRN
  Administered 2016-09-09: 250 [IU]
  Filled 2016-09-09: qty 5

## 2016-09-09 MED ORDER — SODIUM CHLORIDE 0.9 % IV SOLN
510.0000 mg | Freq: Once | INTRAVENOUS | Status: AC
  Administered 2016-09-09: 510 mg via INTRAVENOUS
  Filled 2016-09-09: qty 17

## 2016-09-09 MED ORDER — SODIUM CHLORIDE 0.9 % IV SOLN
Freq: Once | INTRAVENOUS | Status: AC
  Administered 2016-09-09: 250 mL via INTRAVENOUS

## 2016-09-09 NOTE — Progress Notes (Signed)
Drsg to PICC line intact with small amt bloody drainage. Drsg to PICC line changed. Area cleansed with chlora-prep x2. No active bloody drainage. Bio-patch and anchor pad applied. [place] drsg applied. Tolerated well.

## 2016-09-12 ENCOUNTER — Encounter (HOSPITAL_COMMUNITY)
Admission: RE | Admit: 2016-09-12 | Discharge: 2016-09-12 | Disposition: A | Discharge status: Home / Self Care | Payer: Medicare Other | Source: Other Acute Inpatient Hospital | Attending: Pulmonary Disease | Admitting: Pulmonary Disease

## 2016-09-12 DIAGNOSIS — Z7901 Long term (current) use of anticoagulants: Secondary | ICD-10-CM | POA: Insufficient documentation

## 2016-09-12 DIAGNOSIS — N184 Chronic kidney disease, stage 4 (severe): Secondary | ICD-10-CM | POA: Diagnosis not present

## 2016-09-12 DIAGNOSIS — I5032 Chronic diastolic (congestive) heart failure: Secondary | ICD-10-CM | POA: Insufficient documentation

## 2016-09-12 DIAGNOSIS — N390 Urinary tract infection, site not specified: Secondary | ICD-10-CM | POA: Diagnosis not present

## 2016-09-12 DIAGNOSIS — I872 Venous insufficiency (chronic) (peripheral): Secondary | ICD-10-CM | POA: Insufficient documentation

## 2016-09-12 LAB — BASIC METABOLIC PANEL
Anion gap: 10 (ref 5–15)
BUN: 54 mg/dL — AB (ref 6–20)
CHLORIDE: 102 mmol/L (ref 101–111)
CO2: 25 mmol/L (ref 22–32)
CREATININE: 2.23 mg/dL — AB (ref 0.44–1.00)
Calcium: 8.3 mg/dL — ABNORMAL LOW (ref 8.9–10.3)
GFR calc non Af Amer: 19 mL/min — ABNORMAL LOW (ref >60)
GFR, EST AFRICAN AMERICAN: 22 mL/min — AB (ref >60)
Glucose, Bld: 171 mg/dL — ABNORMAL HIGH (ref 65–99)
POTASSIUM: 4.7 mmol/L (ref 3.5–5.1)
Sodium: 137 mmol/L (ref 135–145)

## 2016-09-12 LAB — PROTIME-INR
INR: 2.96
Prothrombin Time: 31.4 seconds — ABNORMAL HIGH (ref 11.4–15.2)

## 2016-09-13 ENCOUNTER — Ambulatory Visit (INDEPENDENT_AMBULATORY_CARE_PROVIDER_SITE_OTHER): Payer: Medicare Other | Admitting: *Deleted

## 2016-09-13 ENCOUNTER — Encounter (HOSPITAL_COMMUNITY)
Admission: RE | Admit: 2016-09-13 | Discharge: 2016-09-13 | Disposition: A | Discharge status: Home / Self Care | Payer: Medicare Other | Source: Skilled Nursing Facility | Attending: *Deleted | Admitting: *Deleted

## 2016-09-13 DIAGNOSIS — Z5181 Encounter for therapeutic drug level monitoring: Secondary | ICD-10-CM

## 2016-09-13 DIAGNOSIS — I4819 Other persistent atrial fibrillation: Secondary | ICD-10-CM

## 2016-09-13 DIAGNOSIS — I481 Persistent atrial fibrillation: Secondary | ICD-10-CM

## 2016-09-13 LAB — URINALYSIS, ROUTINE W REFLEX MICROSCOPIC
Bilirubin Urine: NEGATIVE
GLUCOSE, UA: NEGATIVE mg/dL
HGB URINE DIPSTICK: NEGATIVE
KETONES UR: NEGATIVE mg/dL
NITRITE: NEGATIVE
PH: 5 (ref 5.0–8.0)
PROTEIN: NEGATIVE mg/dL
Specific Gravity, Urine: 1.009 (ref 1.005–1.030)

## 2016-09-16 ENCOUNTER — Encounter (HOSPITAL_COMMUNITY)
Admit: 2016-09-16 | Discharge: 2016-09-16 | Disposition: A | Discharge status: Home / Self Care | Payer: Medicare Other | Attending: Pulmonary Disease | Admitting: Pulmonary Disease

## 2016-09-16 ENCOUNTER — Encounter (HOSPITAL_COMMUNITY): Payer: Self-pay

## 2016-09-16 LAB — URINE CULTURE: Culture: >=100000 — AB

## 2016-09-16 MED ORDER — SODIUM CHLORIDE 0.9 % IV SOLN
510.0000 mg | Freq: Once | INTRAVENOUS | Status: AC
  Administered 2016-09-16: 510 mg via INTRAVENOUS
  Filled 2016-09-16: qty 17

## 2016-09-16 MED ORDER — SODIUM CHLORIDE 0.9 % IV SOLN
Freq: Once | INTRAVENOUS | Status: AC
  Administered 2016-09-16: 09:00:00 via INTRAVENOUS

## 2016-09-18 DIAGNOSIS — E1129 Type 2 diabetes mellitus with other diabetic kidney complication: Secondary | ICD-10-CM | POA: Diagnosis not present

## 2016-09-18 DIAGNOSIS — I5043 Acute on chronic combined systolic (congestive) and diastolic (congestive) heart failure: Secondary | ICD-10-CM | POA: Diagnosis not present

## 2016-09-19 ENCOUNTER — Ambulatory Visit (INDEPENDENT_AMBULATORY_CARE_PROVIDER_SITE_OTHER): Payer: Medicare Other | Admitting: *Deleted

## 2016-09-19 ENCOUNTER — Encounter (HOSPITAL_COMMUNITY)
Admission: RE | Admit: 2016-09-19 | Discharge: 2016-09-19 | Disposition: A | Discharge status: Home / Self Care | Payer: Medicare Other | Source: Skilled Nursing Facility | Attending: *Deleted | Admitting: *Deleted

## 2016-09-19 DIAGNOSIS — I4819 Other persistent atrial fibrillation: Secondary | ICD-10-CM

## 2016-09-19 DIAGNOSIS — I481 Persistent atrial fibrillation: Secondary | ICD-10-CM

## 2016-09-19 DIAGNOSIS — Z5181 Encounter for therapeutic drug level monitoring: Secondary | ICD-10-CM

## 2016-09-19 LAB — BASIC METABOLIC PANEL
Anion gap: 10 (ref 5–15)
BUN: 61 mg/dL — AB (ref 6–20)
CHLORIDE: 101 mmol/L (ref 101–111)
CO2: 25 mmol/L (ref 22–32)
CREATININE: 2.22 mg/dL — AB (ref 0.44–1.00)
Calcium: 8.7 mg/dL — ABNORMAL LOW (ref 8.9–10.3)
GFR calc Af Amer: 22 mL/min — ABNORMAL LOW (ref >60)
GFR calc non Af Amer: 19 mL/min — ABNORMAL LOW (ref >60)
Glucose, Bld: 188 mg/dL — ABNORMAL HIGH (ref 65–99)
Potassium: 4.8 mmol/L (ref 3.5–5.1)
SODIUM: 136 mmol/L (ref 135–145)

## 2016-09-19 LAB — PROTIME-INR
INR: 2.37
PROTHROMBIN TIME: 26.3 s — AB (ref 11.4–15.2)

## 2016-09-23 ENCOUNTER — Encounter (HOSPITAL_COMMUNITY)
Admission: RE | Admit: 2016-09-23 | Discharge: 2016-09-23 | Disposition: A | Discharge status: Home / Self Care | Payer: Medicare Other | Source: Skilled Nursing Facility | Attending: Pulmonary Disease | Admitting: Pulmonary Disease

## 2016-09-23 ENCOUNTER — Ambulatory Visit (INDEPENDENT_AMBULATORY_CARE_PROVIDER_SITE_OTHER): Payer: Medicare Other | Admitting: Internal Medicine

## 2016-09-23 ENCOUNTER — Telehealth: Payer: Self-pay | Admitting: *Deleted

## 2016-09-23 DIAGNOSIS — M6281 Muscle weakness (generalized): Secondary | ICD-10-CM | POA: Insufficient documentation

## 2016-09-23 DIAGNOSIS — N184 Chronic kidney disease, stage 4 (severe): Secondary | ICD-10-CM | POA: Insufficient documentation

## 2016-09-23 DIAGNOSIS — Z7901 Long term (current) use of anticoagulants: Secondary | ICD-10-CM | POA: Diagnosis not present

## 2016-09-23 DIAGNOSIS — I4819 Other persistent atrial fibrillation: Secondary | ICD-10-CM

## 2016-09-23 DIAGNOSIS — I872 Venous insufficiency (chronic) (peripheral): Secondary | ICD-10-CM | POA: Insufficient documentation

## 2016-09-23 DIAGNOSIS — Z5181 Encounter for therapeutic drug level monitoring: Secondary | ICD-10-CM

## 2016-09-23 DIAGNOSIS — I5032 Chronic diastolic (congestive) heart failure: Secondary | ICD-10-CM | POA: Insufficient documentation

## 2016-09-23 DIAGNOSIS — N390 Urinary tract infection, site not specified: Secondary | ICD-10-CM | POA: Diagnosis not present

## 2016-09-23 DIAGNOSIS — I481 Persistent atrial fibrillation: Secondary | ICD-10-CM

## 2016-09-23 LAB — PROTIME-INR
INR: 2.28
PROTHROMBIN TIME: 25.5 s — AB (ref 11.4–15.2)

## 2016-09-23 NOTE — Telephone Encounter (Signed)
See coumadin encounter of this date  09/23/2016

## 2016-09-23 NOTE — Telephone Encounter (Signed)
INR 2.28 PT 25.5 per Delphina at [facility] [phone]

## 2016-09-26 ENCOUNTER — Encounter (HOSPITAL_COMMUNITY)
Admission: RE | Admit: 2016-09-26 | Discharge: 2016-09-26 | Disposition: A | Discharge status: Home / Self Care | Payer: Medicare Other | Source: Skilled Nursing Facility | Attending: *Deleted | Admitting: *Deleted

## 2016-09-26 LAB — BASIC METABOLIC PANEL
ANION GAP: 12 (ref 5–15)
BUN: 74 mg/dL — ABNORMAL HIGH (ref 6–20)
CO2: 27 mmol/L (ref 22–32)
Calcium: 8.7 mg/dL — ABNORMAL LOW (ref 8.9–10.3)
Chloride: 100 mmol/L — ABNORMAL LOW (ref 101–111)
Creatinine, Ser: 2.25 mg/dL — ABNORMAL HIGH (ref 0.44–1.00)
GFR calc Af Amer: 22 mL/min — ABNORMAL LOW (ref >60)
GFR calc non Af Amer: 19 mL/min — ABNORMAL LOW (ref >60)
GLUCOSE: 135 mg/dL — AB (ref 65–99)
POTASSIUM: 3.6 mmol/L (ref 3.5–5.1)
Sodium: 139 mmol/L (ref 135–145)

## 2016-09-28 NOTE — H&P (Signed)
Melissa [name] MRN: [id number] DOB/AGE: 07-14-1930 83 y.o. Primary Care Physician:Branston Halsted, Ramon Dredge, MD Admit date: 09/02/2016 Chief Complaint: For rehabilitation HPI: This is documentation of history and physical performed at the skilled care facility. When I arrived she was sitting quietly in her room. She has been having a lot of trouble with swelling and were trying to balance that against her chronic kidney disease. She has no particular complaints now. She is very hard of hearing. She denies chest pain nausea vomiting diarrhea abdominal pain headache. No PND or orthopnea although she does have a lot of swelling.  Past Medical History:  Diagnosis Date  . Atrial fibrillation (HCC)   . CAD (coronary artery disease)    Multivessel status post high risk PCI/DES to circumflex 11/2015 (poor candidate for CABG)  . CHF (congestive heart failure) (HCC)   . CKD (chronic kidney disease) stage 3, GFR 30-59 ml/min   . DDD (degenerative disc disease), lumbar   . Essential hypertension, benign   . Mixed hyperlipidemia   . Type 2 diabetes mellitus (HCC)    Past Surgical History:  Procedure Laterality Date  . CARDIAC CATHETERIZATION N/A 11/26/2015   Procedure: Right/Left Heart Cath and Coronary Angiography;  Surgeon: Yvonne Kendall, MD;  Location: [facility] INVASIVE CV LAB;  Service: Cardiovascular;  Laterality: N/A;  . CARDIAC CATHETERIZATION N/A 12/01/2015   Procedure: Coronary Stent Intervention Rotoblader;  Surgeon: Tonny Bollman, MD;  Location: [facility] INVASIVE CV LAB;  Service: Cardiovascular;  Laterality: N/A;  . TOTAL HIP ARTHROPLASTY  01/14/03   Left        Family History  Problem Relation Age of Onset  . Diabetes type II Mother   . Diabetes type II Father   . Hypertension Father     Social History:  reports that she has never smoked. She has never used smokeless tobacco. She reports that she does not drink alcohol or use drugs.   Allergies: No Known Allergies  Medications Prior to  Admission  Medication Sig Dispense Refill  . acetaminophen (TYLENOL) 325 MG tablet Take 2 tablets (650 mg total) by mouth every 4 (four) hours as needed for mild pain, moderate pain, fever or headache.    . albuterol (PROVENTIL) (2.5 MG/3ML) 0.083% nebulizer solution Take 3 mLs (2.5 mg total) by nebulization every 6 (six) hours as needed for wheezing or shortness of breath. 75 mL 12  . ALPRAZolam (XANAX) 0.25 MG tablet Take 0.25 mg by mouth 3 (three) times daily as needed.    Marland Kitchen amLODipine (NORVASC) 5 MG tablet Take 1 tablet (5 mg total) by mouth daily. 30 tablet 11  . atorvastatin (LIPITOR) 40 MG tablet Take 1 tablet (40 mg total) by mouth daily. 90 tablet 3  . bisacodyl (DULCOLAX) 10 MG suppository Place 1 suppository (10 mg total) rectally daily as needed for moderate constipation. 12 suppository 0  . bisacodyl (DULCOLAX) 5 MG EC tablet Take 1 tablet (5 mg total) by mouth daily as needed for moderate constipation. 30 tablet 0  . clopidogrel (PLAVIX) 75 MG tablet Take 1 tablet (75 mg total) by mouth daily. 90 tablet 3  . ferumoxytol 510 mg in sodium chloride 0.9 % 100 mL Inject 510 mg into the vein once a week.    . hydrALAZINE (APRESOLINE) 100 MG tablet Take 1 tablet (100 mg total) by mouth 3 (three) times daily. 270 tablet 3  . HYDROcodone-acetaminophen (NORCO/VICODIN) 5-325 MG tablet Take 1 tablet by mouth 4 (four) times daily as needed for moderate pain. 30 tablet 0  .  insulin aspart (NOVOLOG) 100 UNIT/ML injection Inject 0-15 Units into the skin 3 (three) times daily with meals. 10 mL 11  . insulin aspart (NOVOLOG) 100 UNIT/ML injection Inject 0-5 Units into the skin at bedtime. 10 mL 11  . insulin aspart (NOVOLOG) 100 UNIT/ML injection Inject 4 Units into the skin 3 (three) times daily with meals. 10 mL 11  . isosorbide mononitrate (IMDUR) 60 MG 24 hr tablet Take 1 tablet (60 mg total) by mouth daily. 90 tablet 3  . nitroGLYCERIN (NITROSTAT) 0.4 MG SL tablet Place 1 tablet (0.4 mg total)  under the tongue every 5 (five) minutes as needed for chest pain. 25 tablet 2  . pantoprazole (PROTONIX) 40 MG tablet Take 1 tablet (40 mg total) by mouth daily at 6 (six) AM. 90 tablet 3  . primidone (MYSOLINE) 50 MG tablet Take 1 tablet (50 mg total) by mouth daily.    Marland Kitchen PROAIR HFA 108 (90 Base) MCG/ACT inhaler Inhale 1-2 puffs into the lungs 4 (four) times daily.     Marland Kitchen torsemide (DEMADEX) 20 MG tablet Take 1 tablet (20 mg total) by mouth daily.    . TRAVATAN Z 0.004 % SOLN ophthalmic solution Place 1 drop into both eyes at bedtime.     Marland Kitchen umeclidinium bromide (INCRUSE ELLIPTA) 62.5 MCG/INH AEPB Inhale 1 puff into the lungs daily.    . Vitamin D, Ergocalciferol, (DRISDOL) 50000 units CAPS capsule Take 1 capsule (50,000 Units total) by mouth every 7 (seven) days. 30 capsule   . warfarin (COUMADIN) 5 MG tablet TAKE 1 TABLET BY MOUTH DAILY EXCEPT 1/2 TABLET ON WEDNESDAYS AND SATURDAYS. 30 tablet 6       WJX:BJYNW from the symptoms mentioned above,there are no other symptoms referable to all systems reviewed.10 point review of systems otherwise negative  Physical Exam: There were no vitals taken for this visit. Constitutional: She is awake and alert and in no acute distress. She is sitting in a wheelchair. Eyes: Pupils react EOMI. Ears nose mouth and throat: Her mucous membranes are moist. She is very hard of hearing. Her throat is clear. Cardiovascular: She is in atrial fib and has a systolic heart murmur. She has extensive edema bilaterally. Respiratory: Her respiratory effort is normal. Her lungs are clear. Gastrointestinal: Her abdomen is soft with no masses. Skin: Warm and dry with significant edema as mentioned. Neurological: No focal abnormalities other than her hearing. Psychiatric: Normal mood and affect   No results for input(s): WBC, NEUTROABS, HGB, HCT, MCV, PLT in the last 72 hours.  Recent Labs  09/26/16 0630  NA 139  K 3.6  CL 100*  CO2 27  GLUCOSE 135*  BUN 74*   CREATININE 2.25*  CALCIUM 8.7*  lablast2(ast:2,ALT:2,alkphos:2,bilitot:2,prot:2,albumin:2)@    No results found for this or any previous visit (from the past 240 hour(s)).   Dg Wrist Complete Right  Result Date: 08/30/2016 CLINICAL DATA:  Right wrist pain. EXAM: RIGHT WRIST - COMPLETE 3+ VIEW COMPARISON:  07/20/2016 FINDINGS: The patient has arthritis between the lunate, triquetrum, and hamate with cystic degenerative changes and sclerosis in the proximal pole of the hamate. There is slight irregularity of the triquetrum without a discrete fracture. There is chondrocalcinosis at the radiocarpal joint. There is moderate arthritis of the first carpometacarpal joint. IMPRESSION: 1. No acute abnormality. 2. Arthritic changes in the midcarpal joint. 3. Chondrocalcinosis of the radiocarpal joint. 4. Osteoarthritis of the first carpometacarpal joint. 5. No discrete fracture of the triquetrum. Electronically Signed   By: Fayrene Fearing  Maxwell M.D.   On: 08/30/2016 09:27   US Renal  Result Date: 08/30/2016 CLINICAL DATA:  Acute on chronic renal failure history of diabetes, hypertension, and coronary artery disease. EXAM: RENAL / URINARY TRACT ULTRASOUND COMPLETE COMPARISON:  None in PACs FINDINGS: Right Kidney: Length: 11.1 cm. The renal cortical echotexture is increased and is greater than that of the liver. There is no hydronephrosis. There are cystic structures in the inferior aspect of the right kidney. One measures 3.6 cm in greatest dimension in the other 2 cm in greatest dimension. Left Kidney: Length: 9 cm. The renal cortical echotexture is increased similar to that on the right. There is a midpole cortical cyst measuring 2.5 cm in greatest dimension. There is no hydronephrosis. Bladder: The partially distended urinary bladder is normal. IMPRESSION: Increased renal cortical echotexture bilaterally consistent with medical renal disease. There is no hydronephrosis. There are simple appearing cysts in both  kidneys. The urinary bladder is unremarkable where visualized. Electronically Signed   By: David  [place] M.D.   On: 08/30/2016 16:00   Impression: She has significant congestive heart failure. This is complicated by her chronic renal failure. She is still swollen. She has diuretics ordered. She has hypertension which is doing well. She has diabetes and she is on sliding scale. She has significant hearing issues. Active Problems:   * No active hospital problems. *     Plan: Continue efforts at rehabilitation. Continue diuresis.      Keah Lamba L   09/28/2016, 8:56 AM

## 2016-10-06 ENCOUNTER — Encounter (HOSPITAL_COMMUNITY)
Admission: RE | Admit: 2016-10-06 | Discharge: 2016-10-06 | Disposition: A | Discharge status: Home / Self Care | Payer: Medicare Other | Source: Skilled Nursing Facility | Attending: Pulmonary Disease | Admitting: Pulmonary Disease

## 2016-10-06 ENCOUNTER — Ambulatory Visit (INDEPENDENT_AMBULATORY_CARE_PROVIDER_SITE_OTHER): Payer: Medicare Other | Admitting: Cardiovascular Disease

## 2016-10-06 DIAGNOSIS — N184 Chronic kidney disease, stage 4 (severe): Secondary | ICD-10-CM | POA: Insufficient documentation

## 2016-10-06 DIAGNOSIS — N390 Urinary tract infection, site not specified: Secondary | ICD-10-CM | POA: Insufficient documentation

## 2016-10-06 DIAGNOSIS — Z951 Presence of aortocoronary bypass graft: Secondary | ICD-10-CM | POA: Diagnosis not present

## 2016-10-06 DIAGNOSIS — E1122 Type 2 diabetes mellitus with diabetic chronic kidney disease: Secondary | ICD-10-CM | POA: Diagnosis not present

## 2016-10-06 DIAGNOSIS — I5032 Chronic diastolic (congestive) heart failure: Secondary | ICD-10-CM | POA: Insufficient documentation

## 2016-10-06 DIAGNOSIS — I5033 Acute on chronic diastolic (congestive) heart failure: Secondary | ICD-10-CM | POA: Diagnosis not present

## 2016-10-06 DIAGNOSIS — M6281 Muscle weakness (generalized): Secondary | ICD-10-CM | POA: Insufficient documentation

## 2016-10-06 DIAGNOSIS — Z79891 Long term (current) use of opiate analgesic: Secondary | ICD-10-CM | POA: Diagnosis not present

## 2016-10-06 DIAGNOSIS — Z794 Long term (current) use of insulin: Secondary | ICD-10-CM | POA: Diagnosis not present

## 2016-10-06 DIAGNOSIS — Z9981 Dependence on supplemental oxygen: Secondary | ICD-10-CM | POA: Diagnosis not present

## 2016-10-06 DIAGNOSIS — I2510 Atherosclerotic heart disease of native coronary artery without angina pectoris: Secondary | ICD-10-CM | POA: Diagnosis not present

## 2016-10-06 DIAGNOSIS — Z7951 Long term (current) use of inhaled steroids: Secondary | ICD-10-CM | POA: Diagnosis not present

## 2016-10-06 DIAGNOSIS — I4819 Other persistent atrial fibrillation: Secondary | ICD-10-CM

## 2016-10-06 DIAGNOSIS — Z7901 Long term (current) use of anticoagulants: Secondary | ICD-10-CM | POA: Diagnosis not present

## 2016-10-06 DIAGNOSIS — I130 Hypertensive heart and chronic kidney disease with heart failure and stage 1 through stage 4 chronic kidney disease, or unspecified chronic kidney disease: Secondary | ICD-10-CM | POA: Diagnosis not present

## 2016-10-06 DIAGNOSIS — I481 Persistent atrial fibrillation: Secondary | ICD-10-CM

## 2016-10-06 DIAGNOSIS — Z87440 Personal history of urinary (tract) infections: Secondary | ICD-10-CM | POA: Diagnosis not present

## 2016-10-06 DIAGNOSIS — Z5181 Encounter for therapeutic drug level monitoring: Secondary | ICD-10-CM

## 2016-10-06 DIAGNOSIS — I872 Venous insufficiency (chronic) (peripheral): Secondary | ICD-10-CM | POA: Insufficient documentation

## 2016-10-06 DIAGNOSIS — L89152 Pressure ulcer of sacral region, stage 2: Secondary | ICD-10-CM | POA: Diagnosis not present

## 2016-10-06 LAB — POCT INR: INR: 1.7

## 2016-10-07 DIAGNOSIS — E1122 Type 2 diabetes mellitus with diabetic chronic kidney disease: Secondary | ICD-10-CM | POA: Diagnosis not present

## 2016-10-07 DIAGNOSIS — Z951 Presence of aortocoronary bypass graft: Secondary | ICD-10-CM | POA: Diagnosis not present

## 2016-10-07 DIAGNOSIS — I2510 Atherosclerotic heart disease of native coronary artery without angina pectoris: Secondary | ICD-10-CM | POA: Diagnosis not present

## 2016-10-07 DIAGNOSIS — Z87440 Personal history of urinary (tract) infections: Secondary | ICD-10-CM | POA: Diagnosis not present

## 2016-10-07 DIAGNOSIS — I5033 Acute on chronic diastolic (congestive) heart failure: Secondary | ICD-10-CM | POA: Diagnosis not present

## 2016-10-07 DIAGNOSIS — Z794 Long term (current) use of insulin: Secondary | ICD-10-CM | POA: Diagnosis not present

## 2016-10-07 DIAGNOSIS — N184 Chronic kidney disease, stage 4 (severe): Secondary | ICD-10-CM | POA: Diagnosis not present

## 2016-10-07 DIAGNOSIS — J9611 Chronic respiratory failure with hypoxia: Secondary | ICD-10-CM | POA: Diagnosis not present

## 2016-10-07 DIAGNOSIS — Z7901 Long term (current) use of anticoagulants: Secondary | ICD-10-CM | POA: Diagnosis not present

## 2016-10-07 DIAGNOSIS — Z9981 Dependence on supplemental oxygen: Secondary | ICD-10-CM | POA: Diagnosis not present

## 2016-10-07 DIAGNOSIS — Z7951 Long term (current) use of inhaled steroids: Secondary | ICD-10-CM | POA: Diagnosis not present

## 2016-10-07 DIAGNOSIS — I10 Essential (primary) hypertension: Secondary | ICD-10-CM | POA: Diagnosis not present

## 2016-10-07 DIAGNOSIS — L89152 Pressure ulcer of sacral region, stage 2: Secondary | ICD-10-CM | POA: Diagnosis not present

## 2016-10-07 DIAGNOSIS — I130 Hypertensive heart and chronic kidney disease with heart failure and stage 1 through stage 4 chronic kidney disease, or unspecified chronic kidney disease: Secondary | ICD-10-CM | POA: Diagnosis not present

## 2016-10-07 DIAGNOSIS — Z79891 Long term (current) use of opiate analgesic: Secondary | ICD-10-CM | POA: Diagnosis not present

## 2016-10-07 DIAGNOSIS — I481 Persistent atrial fibrillation: Secondary | ICD-10-CM | POA: Diagnosis not present

## 2016-10-10 DIAGNOSIS — Z794 Long term (current) use of insulin: Secondary | ICD-10-CM | POA: Diagnosis not present

## 2016-10-10 DIAGNOSIS — Z7901 Long term (current) use of anticoagulants: Secondary | ICD-10-CM | POA: Diagnosis not present

## 2016-10-10 DIAGNOSIS — Z9981 Dependence on supplemental oxygen: Secondary | ICD-10-CM | POA: Diagnosis not present

## 2016-10-10 DIAGNOSIS — I5033 Acute on chronic diastolic (congestive) heart failure: Secondary | ICD-10-CM | POA: Diagnosis not present

## 2016-10-10 DIAGNOSIS — Z87440 Personal history of urinary (tract) infections: Secondary | ICD-10-CM | POA: Diagnosis not present

## 2016-10-10 DIAGNOSIS — I130 Hypertensive heart and chronic kidney disease with heart failure and stage 1 through stage 4 chronic kidney disease, or unspecified chronic kidney disease: Secondary | ICD-10-CM | POA: Diagnosis not present

## 2016-10-10 DIAGNOSIS — I2510 Atherosclerotic heart disease of native coronary artery without angina pectoris: Secondary | ICD-10-CM | POA: Diagnosis not present

## 2016-10-10 DIAGNOSIS — N184 Chronic kidney disease, stage 4 (severe): Secondary | ICD-10-CM | POA: Diagnosis not present

## 2016-10-10 DIAGNOSIS — E1122 Type 2 diabetes mellitus with diabetic chronic kidney disease: Secondary | ICD-10-CM | POA: Diagnosis not present

## 2016-10-10 DIAGNOSIS — Z79891 Long term (current) use of opiate analgesic: Secondary | ICD-10-CM | POA: Diagnosis not present

## 2016-10-10 DIAGNOSIS — I481 Persistent atrial fibrillation: Secondary | ICD-10-CM | POA: Diagnosis not present

## 2016-10-10 DIAGNOSIS — Z7951 Long term (current) use of inhaled steroids: Secondary | ICD-10-CM | POA: Diagnosis not present

## 2016-10-10 DIAGNOSIS — Z951 Presence of aortocoronary bypass graft: Secondary | ICD-10-CM | POA: Diagnosis not present

## 2016-10-10 DIAGNOSIS — L89152 Pressure ulcer of sacral region, stage 2: Secondary | ICD-10-CM | POA: Diagnosis not present

## 2016-10-11 DIAGNOSIS — Z87440 Personal history of urinary (tract) infections: Secondary | ICD-10-CM | POA: Diagnosis not present

## 2016-10-11 DIAGNOSIS — Z9981 Dependence on supplemental oxygen: Secondary | ICD-10-CM | POA: Diagnosis not present

## 2016-10-11 DIAGNOSIS — I2510 Atherosclerotic heart disease of native coronary artery without angina pectoris: Secondary | ICD-10-CM | POA: Diagnosis not present

## 2016-10-11 DIAGNOSIS — L89152 Pressure ulcer of sacral region, stage 2: Secondary | ICD-10-CM | POA: Diagnosis not present

## 2016-10-11 DIAGNOSIS — Z794 Long term (current) use of insulin: Secondary | ICD-10-CM | POA: Diagnosis not present

## 2016-10-11 DIAGNOSIS — Z951 Presence of aortocoronary bypass graft: Secondary | ICD-10-CM | POA: Diagnosis not present

## 2016-10-11 DIAGNOSIS — E1122 Type 2 diabetes mellitus with diabetic chronic kidney disease: Secondary | ICD-10-CM | POA: Diagnosis not present

## 2016-10-11 DIAGNOSIS — I481 Persistent atrial fibrillation: Secondary | ICD-10-CM | POA: Diagnosis not present

## 2016-10-11 DIAGNOSIS — Z7951 Long term (current) use of inhaled steroids: Secondary | ICD-10-CM | POA: Diagnosis not present

## 2016-10-11 DIAGNOSIS — I5033 Acute on chronic diastolic (congestive) heart failure: Secondary | ICD-10-CM | POA: Diagnosis not present

## 2016-10-11 DIAGNOSIS — I130 Hypertensive heart and chronic kidney disease with heart failure and stage 1 through stage 4 chronic kidney disease, or unspecified chronic kidney disease: Secondary | ICD-10-CM | POA: Diagnosis not present

## 2016-10-11 DIAGNOSIS — Z79891 Long term (current) use of opiate analgesic: Secondary | ICD-10-CM | POA: Diagnosis not present

## 2016-10-11 DIAGNOSIS — N184 Chronic kidney disease, stage 4 (severe): Secondary | ICD-10-CM | POA: Diagnosis not present

## 2016-10-11 DIAGNOSIS — Z7901 Long term (current) use of anticoagulants: Secondary | ICD-10-CM | POA: Diagnosis not present

## 2016-10-13 DIAGNOSIS — Z951 Presence of aortocoronary bypass graft: Secondary | ICD-10-CM | POA: Diagnosis not present

## 2016-10-13 DIAGNOSIS — Z79891 Long term (current) use of opiate analgesic: Secondary | ICD-10-CM | POA: Diagnosis not present

## 2016-10-13 DIAGNOSIS — Z7951 Long term (current) use of inhaled steroids: Secondary | ICD-10-CM | POA: Diagnosis not present

## 2016-10-13 DIAGNOSIS — I5033 Acute on chronic diastolic (congestive) heart failure: Secondary | ICD-10-CM | POA: Diagnosis not present

## 2016-10-13 DIAGNOSIS — Z7901 Long term (current) use of anticoagulants: Secondary | ICD-10-CM | POA: Diagnosis not present

## 2016-10-13 DIAGNOSIS — L89152 Pressure ulcer of sacral region, stage 2: Secondary | ICD-10-CM | POA: Diagnosis not present

## 2016-10-13 DIAGNOSIS — Z794 Long term (current) use of insulin: Secondary | ICD-10-CM | POA: Diagnosis not present

## 2016-10-13 DIAGNOSIS — Z87440 Personal history of urinary (tract) infections: Secondary | ICD-10-CM | POA: Diagnosis not present

## 2016-10-13 DIAGNOSIS — N184 Chronic kidney disease, stage 4 (severe): Secondary | ICD-10-CM | POA: Diagnosis not present

## 2016-10-13 DIAGNOSIS — Z9981 Dependence on supplemental oxygen: Secondary | ICD-10-CM | POA: Diagnosis not present

## 2016-10-13 DIAGNOSIS — I481 Persistent atrial fibrillation: Secondary | ICD-10-CM | POA: Diagnosis not present

## 2016-10-13 DIAGNOSIS — I130 Hypertensive heart and chronic kidney disease with heart failure and stage 1 through stage 4 chronic kidney disease, or unspecified chronic kidney disease: Secondary | ICD-10-CM | POA: Diagnosis not present

## 2016-10-13 DIAGNOSIS — E1122 Type 2 diabetes mellitus with diabetic chronic kidney disease: Secondary | ICD-10-CM | POA: Diagnosis not present

## 2016-10-13 DIAGNOSIS — I2510 Atherosclerotic heart disease of native coronary artery without angina pectoris: Secondary | ICD-10-CM | POA: Diagnosis not present

## 2016-10-14 ENCOUNTER — Encounter (HOSPITAL_COMMUNITY): Payer: Self-pay | Admitting: Emergency Medicine

## 2016-10-14 ENCOUNTER — Inpatient Hospital Stay (HOSPITAL_COMMUNITY)
Admission: EM | Admit: 2016-10-14 | Discharge: 2016-11-07 | DRG: 291 | Disposition: E | Payer: Medicare Other | Attending: Pulmonary Disease | Admitting: Pulmonary Disease

## 2016-10-14 ENCOUNTER — Telehealth: Payer: Self-pay

## 2016-10-14 ENCOUNTER — Emergency Department (HOSPITAL_COMMUNITY): Payer: Medicare Other

## 2016-10-14 DIAGNOSIS — Z9981 Dependence on supplemental oxygen: Secondary | ICD-10-CM | POA: Diagnosis not present

## 2016-10-14 DIAGNOSIS — N289 Disorder of kidney and ureter, unspecified: Secondary | ICD-10-CM

## 2016-10-14 DIAGNOSIS — E1121 Type 2 diabetes mellitus with diabetic nephropathy: Secondary | ICD-10-CM | POA: Diagnosis not present

## 2016-10-14 DIAGNOSIS — I5033 Acute on chronic diastolic (congestive) heart failure: Secondary | ICD-10-CM | POA: Diagnosis not present

## 2016-10-14 DIAGNOSIS — D696 Thrombocytopenia, unspecified: Secondary | ICD-10-CM | POA: Diagnosis present

## 2016-10-14 DIAGNOSIS — Z7901 Long term (current) use of anticoagulants: Secondary | ICD-10-CM | POA: Diagnosis not present

## 2016-10-14 DIAGNOSIS — N184 Chronic kidney disease, stage 4 (severe): Secondary | ICD-10-CM | POA: Diagnosis present

## 2016-10-14 DIAGNOSIS — I340 Nonrheumatic mitral (valve) insufficiency: Secondary | ICD-10-CM | POA: Diagnosis not present

## 2016-10-14 DIAGNOSIS — N183 Chronic kidney disease, stage 3 (moderate): Secondary | ICD-10-CM | POA: Diagnosis not present

## 2016-10-14 DIAGNOSIS — R791 Abnormal coagulation profile: Secondary | ICD-10-CM

## 2016-10-14 DIAGNOSIS — Z96642 Presence of left artificial hip joint: Secondary | ICD-10-CM | POA: Diagnosis not present

## 2016-10-14 DIAGNOSIS — I130 Hypertensive heart and chronic kidney disease with heart failure and stage 1 through stage 4 chronic kidney disease, or unspecified chronic kidney disease: Principal | ICD-10-CM | POA: Diagnosis present

## 2016-10-14 DIAGNOSIS — I5043 Acute on chronic combined systolic (congestive) and diastolic (congestive) heart failure: Secondary | ICD-10-CM | POA: Diagnosis not present

## 2016-10-14 DIAGNOSIS — I2510 Atherosclerotic heart disease of native coronary artery without angina pectoris: Secondary | ICD-10-CM | POA: Diagnosis not present

## 2016-10-14 DIAGNOSIS — N189 Chronic kidney disease, unspecified: Secondary | ICD-10-CM

## 2016-10-14 DIAGNOSIS — Z7189 Other specified counseling: Secondary | ICD-10-CM

## 2016-10-14 DIAGNOSIS — Z4682 Encounter for fitting and adjustment of non-vascular catheter: Secondary | ICD-10-CM | POA: Diagnosis not present

## 2016-10-14 DIAGNOSIS — T82838A Hemorrhage of vascular prosthetic devices, implants and grafts, initial encounter: Secondary | ICD-10-CM | POA: Diagnosis not present

## 2016-10-14 DIAGNOSIS — I361 Nonrheumatic tricuspid (valve) insufficiency: Secondary | ICD-10-CM | POA: Diagnosis present

## 2016-10-14 DIAGNOSIS — H9190 Unspecified hearing loss, unspecified ear: Secondary | ICD-10-CM | POA: Diagnosis present

## 2016-10-14 DIAGNOSIS — J449 Chronic obstructive pulmonary disease, unspecified: Secondary | ICD-10-CM | POA: Diagnosis present

## 2016-10-14 DIAGNOSIS — Z7951 Long term (current) use of inhaled steroids: Secondary | ICD-10-CM | POA: Diagnosis not present

## 2016-10-14 DIAGNOSIS — I482 Chronic atrial fibrillation: Secondary | ICD-10-CM | POA: Diagnosis present

## 2016-10-14 DIAGNOSIS — E669 Obesity, unspecified: Secondary | ICD-10-CM | POA: Diagnosis present

## 2016-10-14 DIAGNOSIS — Z66 Do not resuscitate: Secondary | ICD-10-CM | POA: Diagnosis not present

## 2016-10-14 DIAGNOSIS — R778 Other specified abnormalities of plasma proteins: Secondary | ICD-10-CM

## 2016-10-14 DIAGNOSIS — E119 Type 2 diabetes mellitus without complications: Secondary | ICD-10-CM

## 2016-10-14 DIAGNOSIS — D6959 Other secondary thrombocytopenia: Secondary | ICD-10-CM | POA: Diagnosis not present

## 2016-10-14 DIAGNOSIS — I10 Essential (primary) hypertension: Secondary | ICD-10-CM

## 2016-10-14 DIAGNOSIS — I481 Persistent atrial fibrillation: Secondary | ICD-10-CM | POA: Diagnosis not present

## 2016-10-14 DIAGNOSIS — I509 Heart failure, unspecified: Secondary | ICD-10-CM

## 2016-10-14 DIAGNOSIS — Z79899 Other long term (current) drug therapy: Secondary | ICD-10-CM

## 2016-10-14 DIAGNOSIS — I517 Cardiomegaly: Secondary | ICD-10-CM | POA: Diagnosis not present

## 2016-10-14 DIAGNOSIS — Z515 Encounter for palliative care: Secondary | ICD-10-CM | POA: Diagnosis not present

## 2016-10-14 DIAGNOSIS — E1122 Type 2 diabetes mellitus with diabetic chronic kidney disease: Secondary | ICD-10-CM | POA: Diagnosis present

## 2016-10-14 DIAGNOSIS — E782 Mixed hyperlipidemia: Secondary | ICD-10-CM | POA: Diagnosis present

## 2016-10-14 DIAGNOSIS — I160 Hypertensive urgency: Secondary | ICD-10-CM | POA: Diagnosis present

## 2016-10-14 DIAGNOSIS — R7989 Other specified abnormal findings of blood chemistry: Secondary | ICD-10-CM

## 2016-10-14 DIAGNOSIS — J90 Pleural effusion, not elsewhere classified: Secondary | ICD-10-CM | POA: Diagnosis not present

## 2016-10-14 DIAGNOSIS — D638 Anemia in other chronic diseases classified elsewhere: Secondary | ICD-10-CM | POA: Diagnosis present

## 2016-10-14 DIAGNOSIS — I4819 Other persistent atrial fibrillation: Secondary | ICD-10-CM | POA: Diagnosis present

## 2016-10-14 DIAGNOSIS — Y838 Other surgical procedures as the cause of abnormal reaction of the patient, or of later complication, without mention of misadventure at the time of the procedure: Secondary | ICD-10-CM | POA: Diagnosis not present

## 2016-10-14 DIAGNOSIS — Z8249 Family history of ischemic heart disease and other diseases of the circulatory system: Secondary | ICD-10-CM

## 2016-10-14 DIAGNOSIS — Z95828 Presence of other vascular implants and grafts: Secondary | ICD-10-CM

## 2016-10-14 DIAGNOSIS — Z951 Presence of aortocoronary bypass graft: Secondary | ICD-10-CM | POA: Diagnosis not present

## 2016-10-14 DIAGNOSIS — Z7902 Long term (current) use of antithrombotics/antiplatelets: Secondary | ICD-10-CM

## 2016-10-14 DIAGNOSIS — Z833 Family history of diabetes mellitus: Secondary | ICD-10-CM

## 2016-10-14 DIAGNOSIS — Z794 Long term (current) use of insulin: Secondary | ICD-10-CM

## 2016-10-14 DIAGNOSIS — R0602 Shortness of breath: Secondary | ICD-10-CM | POA: Diagnosis not present

## 2016-10-14 DIAGNOSIS — D631 Anemia in chronic kidney disease: Secondary | ICD-10-CM | POA: Diagnosis present

## 2016-10-14 DIAGNOSIS — Z6841 Body Mass Index (BMI) 40.0 and over, adult: Secondary | ICD-10-CM

## 2016-10-14 DIAGNOSIS — Z9861 Coronary angioplasty status: Secondary | ICD-10-CM

## 2016-10-14 DIAGNOSIS — E1140 Type 2 diabetes mellitus with diabetic neuropathy, unspecified: Secondary | ICD-10-CM | POA: Diagnosis not present

## 2016-10-14 DIAGNOSIS — Z87440 Personal history of urinary (tract) infections: Secondary | ICD-10-CM | POA: Diagnosis not present

## 2016-10-14 DIAGNOSIS — Z79891 Long term (current) use of opiate analgesic: Secondary | ICD-10-CM | POA: Diagnosis not present

## 2016-10-14 DIAGNOSIS — N179 Acute kidney failure, unspecified: Secondary | ICD-10-CM | POA: Diagnosis not present

## 2016-10-14 DIAGNOSIS — J9811 Atelectasis: Secondary | ICD-10-CM | POA: Diagnosis not present

## 2016-10-14 DIAGNOSIS — L89152 Pressure ulcer of sacral region, stage 2: Secondary | ICD-10-CM | POA: Diagnosis not present

## 2016-10-14 HISTORY — DX: Atherosclerotic heart disease of native coronary artery without angina pectoris: I25.10

## 2016-10-14 HISTORY — DX: Pneumonia due to Klebsiella pneumoniae: J15.0

## 2016-10-14 HISTORY — DX: Unstable angina: I20.0

## 2016-10-14 HISTORY — DX: Nonrheumatic tricuspid (valve) insufficiency: I36.1

## 2016-10-14 HISTORY — DX: Dependence on supplemental oxygen: Z99.81

## 2016-10-14 HISTORY — DX: Anemia, unspecified: D64.9

## 2016-10-14 HISTORY — DX: Pure hypercholesterolemia, unspecified: E78.00

## 2016-10-14 HISTORY — DX: Type 2 diabetes mellitus with diabetic neuropathy, unspecified: E11.40

## 2016-10-14 LAB — GLUCOSE, CAPILLARY: GLUCOSE-CAPILLARY: 227 mg/dL — AB (ref 65–99)

## 2016-10-14 LAB — CBC WITH DIFFERENTIAL/PLATELET
BASOS PCT: 0 %
Basophils Absolute: 0 10*3/uL (ref 0.0–0.1)
EOS ABS: 0.2 10*3/uL (ref 0.0–0.7)
Eosinophils Relative: 2 %
HCT: 33.2 % — ABNORMAL LOW (ref 36.0–46.0)
Hemoglobin: 10.6 g/dL — ABNORMAL LOW (ref 12.0–15.0)
Lymphocytes Relative: 24 %
Lymphs Abs: 1.9 10*3/uL (ref 0.7–4.0)
MCH: 31.7 pg (ref 26.0–34.0)
MCHC: 31.9 g/dL (ref 30.0–36.0)
MCV: 99.4 fL (ref 78.0–100.0)
MONO ABS: 0.7 10*3/uL (ref 0.1–1.0)
MONOS PCT: 9 %
NEUTROS PCT: 65 %
Neutro Abs: 5.1 10*3/uL (ref 1.7–7.7)
Platelets: 127 10*3/uL — ABNORMAL LOW (ref 150–400)
RBC: 3.34 MIL/uL — ABNORMAL LOW (ref 3.87–5.11)
RDW: 16.1 % — AB (ref 11.5–15.5)
WBC: 7.9 10*3/uL (ref 4.0–10.5)

## 2016-10-14 LAB — PROTIME-INR
INR: 1.64
Prothrombin Time: 19.3 seconds — ABNORMAL HIGH (ref 11.4–15.2)

## 2016-10-14 LAB — URINALYSIS, ROUTINE W REFLEX MICROSCOPIC
BILIRUBIN URINE: NEGATIVE
GLUCOSE, UA: NEGATIVE mg/dL
HGB URINE DIPSTICK: NEGATIVE
Ketones, ur: NEGATIVE mg/dL
NITRITE: NEGATIVE
PH: 5 (ref 5.0–8.0)
Protein, ur: NEGATIVE mg/dL
SPECIFIC GRAVITY, URINE: 1.01 (ref 1.005–1.030)

## 2016-10-14 LAB — BASIC METABOLIC PANEL
Anion gap: 10 (ref 5–15)
BUN: 78 mg/dL — AB (ref 6–20)
CO2: 28 mmol/L (ref 22–32)
CREATININE: 2.22 mg/dL — AB (ref 0.44–1.00)
Calcium: 8.8 mg/dL — ABNORMAL LOW (ref 8.9–10.3)
Chloride: 105 mmol/L (ref 101–111)
GFR calc Af Amer: 22 mL/min — ABNORMAL LOW (ref >60)
GFR, EST NON AFRICAN AMERICAN: 19 mL/min — AB (ref >60)
Glucose, Bld: 166 mg/dL — ABNORMAL HIGH (ref 65–99)
POTASSIUM: 3.9 mmol/L (ref 3.5–5.1)
SODIUM: 143 mmol/L (ref 135–145)

## 2016-10-14 LAB — TROPONIN I: TROPONIN I: 0.05 ng/mL — AB (ref <0.03)

## 2016-10-14 LAB — BRAIN NATRIURETIC PEPTIDE: B NATRIURETIC PEPTIDE 5: 871 pg/mL — AB (ref 0.0–100.0)

## 2016-10-14 MED ORDER — ALBUTEROL SULFATE (2.5 MG/3ML) 0.083% IN NEBU
2.5000 mg | INHALATION_SOLUTION | Freq: Four times a day (QID) | RESPIRATORY_TRACT | Status: DC | PRN
  Administered 2016-10-14 – 2016-10-15 (×5): 2.5 mg via RESPIRATORY_TRACT
  Filled 2016-10-14 (×5): qty 3

## 2016-10-14 MED ORDER — ORAL CARE MOUTH RINSE
15.0000 mL | Freq: Two times a day (BID) | OROMUCOSAL | Status: DC
  Administered 2016-10-14 – 2016-10-21 (×12): 15 mL via OROMUCOSAL

## 2016-10-14 MED ORDER — SODIUM CHLORIDE 0.9% FLUSH
3.0000 mL | Freq: Two times a day (BID) | INTRAVENOUS | Status: DC
  Administered 2016-10-14 – 2016-10-23 (×14): 3 mL via INTRAVENOUS

## 2016-10-14 MED ORDER — BISACODYL 5 MG PO TBEC: 5.0000 mg | DELAYED_RELEASE_TABLET | Freq: Every day | ORAL | Status: DC | PRN

## 2016-10-14 MED ORDER — HEPARIN (PORCINE) IN NACL 100-0.45 UNIT/ML-% IJ SOLN
1200.0000 [IU]/h | INTRAMUSCULAR | Status: DC
  Administered 2016-10-14: 1200 [IU]/h via INTRAVENOUS
  Filled 2016-10-14 (×2): qty 250

## 2016-10-14 MED ORDER — ISOSORBIDE MONONITRATE ER 60 MG PO TB24
60.0000 mg | ORAL_TABLET | Freq: Every day | ORAL | Status: DC
  Administered 2016-10-15 – 2016-10-20 (×6): 60 mg via ORAL
  Filled 2016-10-14 (×7): qty 1

## 2016-10-14 MED ORDER — NITROGLYCERIN 0.4 MG SL SUBL: 0.4000 mg | SUBLINGUAL_TABLET | SUBLINGUAL | Status: DC | PRN

## 2016-10-14 MED ORDER — LATANOPROST 0.005 % OP SOLN
1.0000 [drp] | Freq: Every day | OPHTHALMIC | Status: DC
  Administered 2016-10-14 – 2016-10-20 (×7): 1 [drp] via OPHTHALMIC
  Filled 2016-10-14 (×2): qty 2.5

## 2016-10-14 MED ORDER — INSULIN ASPART 100 UNIT/ML ~~LOC~~ SOLN
0.0000 [IU] | Freq: Every day | SUBCUTANEOUS | Status: DC
  Administered 2016-10-14: 2 [IU] via SUBCUTANEOUS

## 2016-10-14 MED ORDER — ALPRAZOLAM 0.25 MG PO TABS
0.2500 mg | ORAL_TABLET | Freq: Three times a day (TID) | ORAL | Status: DC | PRN
  Administered 2016-10-15 – 2016-10-20 (×10): 0.25 mg via ORAL
  Filled 2016-10-14 (×11): qty 1

## 2016-10-14 MED ORDER — ATORVASTATIN CALCIUM 40 MG PO TABS
40.0000 mg | ORAL_TABLET | Freq: Every day | ORAL | Status: DC
  Administered 2016-10-14 – 2016-10-19 (×5): 40 mg via ORAL
  Filled 2016-10-14 (×6): qty 1

## 2016-10-14 MED ORDER — CLOPIDOGREL BISULFATE 75 MG PO TABS
75.0000 mg | ORAL_TABLET | Freq: Every day | ORAL | Status: DC
  Administered 2016-10-15 – 2016-10-20 (×6): 75 mg via ORAL
  Filled 2016-10-14 (×7): qty 1

## 2016-10-14 MED ORDER — SODIUM CHLORIDE 0.9% FLUSH
3.0000 mL | INTRAVENOUS | Status: DC | PRN
Start: 2016-10-14 — End: 2016-10-24
  Administered 2016-10-18: 10 mL via INTRAVENOUS
  Filled 2016-10-14: qty 3

## 2016-10-14 MED ORDER — SODIUM CHLORIDE 0.9 % IV SOLN: 250.0000 mL | INTRAVENOUS | Status: DC | PRN

## 2016-10-14 MED ORDER — HYDRALAZINE HCL 25 MG PO TABS
100.0000 mg | ORAL_TABLET | Freq: Three times a day (TID) | ORAL | Status: DC
  Administered 2016-10-14 – 2016-10-20 (×19): 100 mg via ORAL
  Filled 2016-10-14 (×21): qty 4

## 2016-10-14 MED ORDER — HYDROCODONE-ACETAMINOPHEN 5-325 MG PO TABS
1.0000 | ORAL_TABLET | Freq: Four times a day (QID) | ORAL | Status: DC | PRN
Start: 2016-10-14 — End: 2016-10-22
  Administered 2016-10-14 – 2016-10-17 (×3): 2 via ORAL
  Administered 2016-10-17: 1 via ORAL
  Administered 2016-10-18 – 2016-10-20 (×5): 2 via ORAL
  Filled 2016-10-14 (×9): qty 2

## 2016-10-14 MED ORDER — ONDANSETRON HCL 4 MG/2ML IJ SOLN: 4.0000 mg | Freq: Four times a day (QID) | INTRAMUSCULAR | Status: DC | PRN

## 2016-10-14 MED ORDER — WARFARIN SODIUM 5 MG PO TABS
5.0000 mg | ORAL_TABLET | Freq: Once | ORAL | Status: AC
  Administered 2016-10-14: 5 mg via ORAL
  Filled 2016-10-14: qty 1

## 2016-10-14 MED ORDER — HYDRALAZINE HCL 20 MG/ML IJ SOLN
10.0000 mg | INTRAMUSCULAR | Status: DC | PRN
  Administered 2016-10-20 – 2016-10-22 (×3): 10 mg via INTRAVENOUS
  Filled 2016-10-14 (×3): qty 1

## 2016-10-14 MED ORDER — HEPARIN BOLUS VIA INFUSION
2000.0000 [IU] | Freq: Once | INTRAVENOUS | Status: AC
  Administered 2016-10-14: 2000 [IU] via INTRAVENOUS

## 2016-10-14 MED ORDER — FUROSEMIDE 10 MG/ML IJ SOLN
40.0000 mg | Freq: Two times a day (BID) | INTRAMUSCULAR | Status: DC
  Administered 2016-10-15 – 2016-10-16 (×2): 40 mg via INTRAVENOUS
  Filled 2016-10-14 (×2): qty 4

## 2016-10-14 MED ORDER — PANTOPRAZOLE SODIUM 40 MG PO TBEC
40.0000 mg | DELAYED_RELEASE_TABLET | Freq: Every day | ORAL | Status: DC
  Administered 2016-10-15 – 2016-10-21 (×7): 40 mg via ORAL
  Filled 2016-10-14 (×7): qty 1

## 2016-10-14 MED ORDER — AMLODIPINE BESYLATE 5 MG PO TABS
5.0000 mg | ORAL_TABLET | Freq: Every day | ORAL | Status: DC
  Administered 2016-10-15 – 2016-10-20 (×6): 5 mg via ORAL
  Filled 2016-10-14 (×7): qty 1

## 2016-10-14 MED ORDER — GABAPENTIN 100 MG PO CAPS
100.0000 mg | ORAL_CAPSULE | Freq: Every day | ORAL | Status: DC
  Administered 2016-10-14 – 2016-10-20 (×7): 100 mg via ORAL
  Filled 2016-10-14 (×7): qty 1

## 2016-10-14 MED ORDER — INSULIN ASPART 100 UNIT/ML ~~LOC~~ SOLN
0.0000 [IU] | Freq: Three times a day (TID) | SUBCUTANEOUS | Status: DC
  Administered 2016-10-15 (×3): 1 [IU] via SUBCUTANEOUS
  Administered 2016-10-16: 2 [IU] via SUBCUTANEOUS
  Administered 2016-10-16: 1 [IU] via SUBCUTANEOUS
  Administered 2016-10-16 – 2016-10-17 (×2): 2 [IU] via SUBCUTANEOUS
  Administered 2016-10-17: 3 [IU] via SUBCUTANEOUS
  Administered 2016-10-17: 2 [IU] via SUBCUTANEOUS
  Administered 2016-10-18 – 2016-10-21 (×9): 1 [IU] via SUBCUTANEOUS

## 2016-10-14 MED ORDER — UMECLIDINIUM BROMIDE 62.5 MCG/INH IN AEPB
1.0000 | INHALATION_SPRAY | Freq: Every day | RESPIRATORY_TRACT | Status: DC
  Administered 2016-10-15 – 2016-10-20 (×6): 1 via RESPIRATORY_TRACT
  Filled 2016-10-14: qty 7

## 2016-10-14 MED ORDER — FUROSEMIDE 10 MG/ML IJ SOLN
40.0000 mg | Freq: Once | INTRAMUSCULAR | Status: AC
  Administered 2016-10-14: 40 mg via INTRAVENOUS
  Filled 2016-10-14: qty 4

## 2016-10-14 MED ORDER — WARFARIN - PHARMACIST DOSING INPATIENT: Status: DC

## 2016-10-14 MED ORDER — PRIMIDONE 50 MG PO TABS
50.0000 mg | ORAL_TABLET | Freq: Every day | ORAL | Status: DC
  Administered 2016-10-15 – 2016-10-20 (×6): 50 mg via ORAL
  Filled 2016-10-14 (×10): qty 1

## 2016-10-14 MED ORDER — LIVING BETTER WITH HEART FAILURE BOOK
Freq: Once | Status: AC
  Administered 2016-10-14: 22:00:00

## 2016-10-14 MED ORDER — ACETAMINOPHEN 325 MG PO TABS
650.0000 mg | ORAL_TABLET | ORAL | Status: DC | PRN
  Administered 2016-10-18 – 2016-10-21 (×2): 650 mg via ORAL
  Filled 2016-10-14 (×2): qty 2

## 2016-10-14 NOTE — ED Provider Notes (Signed)
[facility]-EMER[facility]Y DEPT Provider Note   CSN: [id number] Arrival date & time: 10/09/2016  1239     History   Chief Complaint Chief Complaint  Patient presents with  . Shortness of Breath    HPI Melissa [name] is a 83 y.o. female.  HPI  Pt was seen at 1315. Per pt and her family, c/o gradual onset and worsening of persistent "fluid build up" for the past 1 week. Since July, pt has "gained 23#," per family, and pt has "gained at least 4-5# in the past 2-3 days," despite taking her torsemide as prescribed. Has been associated with increased pedal edema and SOB, which worsens with exertion and laying flat. Denies CP/palpitations, no cough, no abd pain, no N/V/D, no back pain.    Past Medical History:  Diagnosis Date  . Anemia   . Atherosclerotic heart disease   . Atrial fibrillation (HCC)   . CAD (coronary artery disease)    Multivessel status post high risk PCI/DES to circumflex 11/2015 (poor candidate for CABG)  . CHF (congestive heart failure) (HCC)   . CKD (chronic kidney disease) stage 3, GFR 30-59 ml/min   . DDD (degenerative disc disease), lumbar   . Diabetic neuropathy (HCC)   . Essential hypertension, benign   . High cholesterol   . Klebsiella pneumonia (HCC)   . Mixed hyperlipidemia   . Nonrheumatic tricuspid (valve) insufficiency   . On home O2   . Type 2 diabetes mellitus (HCC)   . Unstable angina [facility](HCC)     Patient Active Problem List   Diagnosis Date Noted  . UTI (urinary tract infection) 09/02/2016  . Pressure injury of skin 08/31/2016  . Acute on chronic diastolic (congestive) heart failure (HCC) 08/19/2016  . Hand fracture, right 07/21/2016  . Diabetes mellitus type 2, controlled (HCC) 07/20/2016  . Volume depletion 07/20/2016  . Myoclonic jerking 07/20/2016  . Hyperosmolarity syndrome 07/20/2016  . Status post coronary artery stent placement   . Anemia of chronic disease 12/03/2015  . Bradycardia 12/01/2015  . CAD S/P high risk PCI 12/01/15 12/01/2015    . NSTEMI (non-ST elevated myocardial infarction) (HCC)   . Pulmonary hypertension (HCC)   . Chronic kidney disease (CKD), stage IV (severe) (HCC) 11/24/2015  . Unstable angina (HCC) 11/22/2015  . Chest pain 11/10/2015  . Angina at rest [facility](HCC) 11/10/2015  . Acute CHF (congestive heart failure) (HCC) 11/10/2015  . Chronic venous insufficiency 02/19/2015  . Acute on chronic diastolic CHF (congestive heart failure) (HCC) 02/17/2015  . Tricuspid valve regurgitation 02/17/2015  . Encounter for therapeutic drug monitoring 03/11/2013  . Long term current use of anticoagulant 05/05/2010  . Mixed hyperlipidemia 09/30/2009  . Essential hypertension, benign 09/24/2008  . Persistent atrial fibrillation (HCC) 09/24/2008    Past Surgical History:  Procedure Laterality Date  . CARDIAC CATHETERIZATION N/A 11/26/2015   Procedure: Right/Left Heart Cath and Coronary Angiography;  Surgeon: Yvonne Kendallhristopher End, MD;  [facility];  Service: Cardiovascular;  Laterality: N/A;  . CARDIAC CATHETERIZATION N/A 12/01/2015   Procedure: Coronary Stent Intervention Rotoblader;  Surgeon: Tonny BollmanMichael Cooper, MD;  [facility];  Service: Cardiovascular;  Laterality: N/A;  . TOTAL HIP ARTHROPLASTY  01/14/03   Left    OB History    No data available       Home Medications    Prior to Admission medications   Medication Sig Start Date End Date Taking? Authorizing Provider  acetaminophen (TYLENOL) 325 MG tablet Take 2 tablets (650 mg total)  by mouth every 4 (four) hours as needed for mild pain, moderate pain, fever or headache. 12/05/15   Abelino Derrick, PA-C  albuterol (PROVENTIL) (2.5 MG/3ML) 0.083% nebulizer solution Take 3 mLs (2.5 mg total) by nebulization every 6 (six) hours as needed for wheezing or shortness of breath. 09/02/16   Kari Baars, MD  ALPRAZolam Prudy Feeler) 0.25 MG tablet Take 0.25 mg by mouth 3 (three) times daily as needed. 11/19/15   [provider]  amLODipine  (NORVASC) 5 MG tablet Take 1 tablet (5 mg total) by mouth daily. 09/02/16 09/02/17  Kari Baars, MD  atorvastatin (LIPITOR) 40 MG tablet Take 1 tablet (40 mg total) by mouth daily. 12/06/15   Abelino Derrick, PA-C  bisacodyl (DULCOLAX) 10 MG suppository Place 1 suppository (10 mg total) rectally daily as needed for moderate constipation. 09/02/16   Kari Baars, MD  bisacodyl (DULCOLAX) 5 MG EC tablet Take 1 tablet (5 mg total) by mouth daily as needed for moderate constipation. 09/02/16   Kari Baars, MD  clopidogrel (PLAVIX) 75 MG tablet Take 1 tablet (75 mg total) by mouth daily. 12/06/15   Abelino Derrick, PA-C  ferumoxytol 510 mg in sodium chloride 0.9 % 100 mL Inject 510 mg into the vein once a week. 09/08/16   Kari Baars, MD  gabapentin (NEURONTIN) 100 MG capsule Take 100 mg by mouth at bedtime. Take 2 tabs at night    [provider]  hydrALAZINE (APRESOLINE) 100 MG tablet Take 1 tablet (100 mg total) by mouth 3 (three) times daily. 12/05/15   Abelino Derrick, PA-C  HYDROcodone-acetaminophen (NORCO/VICODIN) 5-325 MG tablet Take 1 tablet by mouth 4 (four) times daily as needed for moderate pain. 09/02/16   Kari Baars, MD  insulin aspart (NOVOLOG) 100 UNIT/ML injection Inject 0-15 Units into the skin 3 (three) times daily with meals. 09/02/16   Kari Baars, MD  insulin aspart (NOVOLOG) 100 UNIT/ML injection Inject 0-5 Units into the skin at bedtime. 09/02/16   Kari Baars, MD  insulin aspart (NOVOLOG) 100 UNIT/ML injection Inject 4 Units into the skin 3 (three) times daily with meals. 09/02/16   Kari Baars, MD  isosorbide mononitrate (IMDUR) 60 MG 24 hr tablet Take 1 tablet (60 mg total) by mouth daily. 12/06/15   Abelino Derrick, PA-C  nitroGLYCERIN (NITROSTAT) 0.4 MG SL tablet Place 1 tablet (0.4 mg total) under the tongue every 5 (five) minutes as needed for chest pain. 12/05/15   Abelino Derrick, PA-C  pantoprazole (PROTONIX) 40 MG tablet Take 1 tablet (40 mg  total) by mouth daily at 6 (six) AM. 12/06/15   Kilroy, Eda Paschal, PA-C  primidone (MYSOLINE) 50 MG tablet Take 1 tablet (50 mg total) by mouth daily. 09/02/16   Kari Baars, MD  PROAIR HFA [phone] Base) MCG/ACT inhaler Inhale 1-2 puffs into the lungs 4 (four) times daily.  08/11/16   [provider]  torsemide (DEMADEX) 20 MG tablet Take 1 tablet (20 mg total) by mouth daily. 09/02/16   Kari Baars, MD  TRAVATAN Z 0.004 % SOLN ophthalmic solution Place 1 drop into both eyes at bedtime.  11/08/14   [provider]  umeclidinium bromide (INCRUSE ELLIPTA) 62.5 MCG/INH AEPB Inhale 1 puff into the lungs daily.    [provider]  Vitamin D, Ergocalciferol, (DRISDOL) 50000 units CAPS capsule Take 1 capsule (50,000 Units total) by mouth every 7 (seven) days. 09/07/16   Kari Baars, MD  warfarin (COUMADIN) 5 MG tablet TAKE 1  TABLET BY MOUTH DAILY EXCEPT 1/2 TABLET ON [facility] AND SATURDAYS. 07/11/16   Jonelle Sidle, MD    Family History Family History  Problem Relation Age of Onset  . Diabetes type II Mother   . Diabetes type II Father   . Hypertension Father     Social History Social History  Substance Use Topics  . Smoking status: Never Smoker  . Smokeless tobacco: Never Used  . Alcohol use No     Allergies   Patient has no known allergies.   Review of Systems Review of Systems ROS: Statement: All systems negative except as marked or noted in the HPI; Constitutional: Negative for fever and chills. ; ; Eyes: Negative for eye pain, redness and discharge. ; ; ENMT: Negative for ear pain, hoarseness, nasal congestion, sinus pressure and sore throat. ; ; Cardiovascular: Negative for chest pain, palpitations, diaphoresis, +dyspnea and peripheral edema. ; ; Respiratory: Negative for cough, wheezing and stridor. ; ; Gastrointestinal: Negative for nausea, vomiting, diarrhea, abdominal pain, blood in stool, hematemesis, jaundice and rectal bleeding. . ; ;  Genitourinary: Negative for dysuria, flank pain and hematuria. ; ; Musculoskeletal: Negative for back pain and neck pain. Negative for deformity and trauma.; ; Skin: Negative for pruritus, rash, abrasions, blisters, bruising and skin lesion.; ; Neuro: Negative for headache, lightheadedness and neck stiffness. Negative for weakness, altered level of consciousness, altered mental status, extremity weakness, paresthesias, involuntary movement, seizure and syncope.       Physical Exam Updated Vital Signs Pulse 75   Temp 98.1 F (36.7 C) (Oral)   Resp (!) 30   Ht  (1.626 m)   Wt 108.9 kg (240 lb)   SpO2 97%   BMI 41.20 kg/m   Physical Exam 1320: Physical examination:  Nursing notes reviewed; Vital signs and O2 SAT reviewed;  Constitutional: Well developed, Well nourished, Well hydrated, In no acute distress; Head:  Normocephalic, atraumatic; Eyes: EOMI, PERRL, No scleral icterus; ENMT: Mouth and pharynx normal, Mucous membranes moist; Neck: Supple, Full range of motion, No lymphadenopathy; Cardiovascular: Irregular rate and rhythm, No gallop; Respiratory: Breath sounds coarse & equal bilaterally, No wheezes.  Speaking full sentences with ease, Normal respiratory effort/excursion; Chest: Nontender, Movement normal; Abdomen: Soft, Nontender, Nondistended, Normal bowel sounds; Genitourinary: No CVA tenderness; Extremities: Pulses normal, No tenderness, +2 bilateral pedal and forearms edema. No calf asymmetry.; Neuro: AA&Ox3, Major CN grossly intact.  Speech clear. No gross focal motor or sensory deficits in extremities.; Skin: Color normal, Warm, Dry.   ED Treatments / Results  Labs (all labs ordered are listed, but only abnormal results are displayed)   EKG  EKG Interpretation  Date/Time:  Friday October 14 2016 13:02:29 EDT Ventricular Rate:  72 PR Interval:    QRS Duration: 92 QT Interval:  444 QTC Calculation: 486 R Axis:   30 Text Interpretation:  Atrial fibrillation Low  voltage QRS ST & T wave abnormality, consider inferior ischemia Baseline wander Artifact When compared with ECG of 08/19/2016 Rate slower suggest repeat tracing Confirmed by Samuel Jester [phone]) on 10/18/2016 1:34:08 PM       Radiology   Procedures Procedures (including critical care time)  Medications Ordered in ED Medications - No data to display   Initial Impression / Assessment and Plan / ED Course  I have reviewed the triage vital signs and the nursing notes.  Pertinent labs & imaging results that were available during my care of the patient were reviewed by me and considered in my medical  decision making (see chart for details).  MDM Reviewed: previous chart, nursing note and vitals Reviewed previous: labs and ECG Interpretation: labs, ECG and x-ray    Results for orders placed or performed during the hospital encounter of 2016-10-20  Basic metabolic panel  Result Value Ref Range   Sodium 143 135 - 145 mmol/L   Potassium 3.9 3.5 - 5.1 mmol/L   Chloride 105 101 - 111 mmol/L   CO2 28 22 - 32 mmol/L   Glucose, Bld 166 (H) 65 - 99 mg/dL   BUN 78 (H) 6 - 20 mg/dL   Creatinine, Ser 1.61 (H) 0.44 - 1.00 mg/dL   Calcium 8.8 (L) 8.9 - 10.3 mg/dL   GFR calc non Af Amer 19 (L) >60 mL/min   GFR calc Af Amer 22 (L) >60 mL/min   Anion gap 10 5 - 15  Brain natriuretic peptide  Result Value Ref Range   B Natriuretic Peptide 871.0 (H) 0.0 - 100.0 pg/mL  Troponin I  Result Value Ref Range   Troponin I 0.05 (HH) <0.03 ng/mL  CBC with Differential  Result Value Ref Range   WBC 7.9 4.0 - 10.5 K/uL   RBC 3.34 (L) 3.87 - 5.11 MIL/uL   Hemoglobin 10.6 (L) 12.0 - 15.0 g/dL   HCT 09.6 (L) 04.5 - 40.9 %   MCV 99.4 78.0 - 100.0 fL   MCH 31.7 26.0 - 34.0 pg   MCHC 31.9 30.0 - 36.0 g/dL   RDW 81.1 (H) 91.4 - 78.2 %   Platelets 127 (L) 150 - 400 K/uL   Neutrophils Relative % 65 %   Neutro Abs 5.1 1.7 - 7.7 K/uL   Lymphocytes Relative 24 %   Lymphs Abs 1.9 0.7 - 4.0 K/uL    Monocytes Relative 9 %   Monocytes Absolute 0.7 0.1 - 1.0 K/uL   Eosinophils Relative 2 %   Eosinophils Absolute 0.2 0.0 - 0.7 K/uL   Basophils Relative 0 %   Basophils Absolute 0.0 0.0 - 0.1 K/uL  Protime-INR  Result Value Ref Range   Prothrombin Time 19.3 (H) 11.4 - 15.2 seconds   INR 1.64    Dg Chest Port 1 View Result Date: October 20, 2016 CLINICAL DATA:  Diffuse body edema, weight gain and shortness of breath for several days. EXAM: PORTABLE CHEST 1 VIEW COMPARISON:  08/22/2016. FINDINGS: Poor inspiration. Stable enlarged cardiac silhouette. No significant change and prominence of the pulmonary vasculature and interstitial markings taking the decreased inspiration into account. New linear densities in both mid lung zones. Aortic arch calcifications. No acute bony abnormality. IMPRESSION: 1. Poor inspiration with interval mild linear atelectasis in both mid lung zones. 2. Stable cardiomegaly, pulmonary vascular congestion and mild chronic interstitial lung disease. Electronically Signed   By: Beckie Salts M.D.   On: 20-Oct-2016 13:48   Results for KIAN, GAMARRA (MRN [id number]) as of 2016/10/20 15:03  Ref. Range 02/17/2015 17:59 12/04/2015 02:26 08/18/2016 22:10 10/20/16 13:28  B Natriuretic Peptide Latest Ref Range: 0.0 - 100.0 pg/mL 982.0 (H) 943.6 (H) 743.0 (H) 871.0 (H)     1515:  BUN/Cr and H/H per baseline. Pt appears clinically fluid overloaded:  BNP elevated with weight gain and pulm vasc congestion on CXR; will dose IV lasix. T/C to [facility]. Antionette Char, case discussed, including:  HPI, pertinent PM/SHx, VS/PE, dx testing, ED course and treatment:  Agreeable to admit.    Final Clinical Impressions(s) / ED Diagnoses   Final diagnoses:  SOB (shortness of breath)  New Prescriptions New Prescriptions   No medications on file     Samuel Jester, DO 10/17/16 1552

## 2016-10-14 NOTE — ED Notes (Signed)
Sent here from Dr office because "she is a little more swollen" per family  Lives with her daughter   Edema thruout body and appendages

## 2016-10-14 NOTE — H&P (Signed)
History and Physical    Melissa [name] ZOX:[id number] DOB: 21-Jan-1931 DOA: 10/16/2016  PCP: Kari BaarsHawkins, Edward, MD   Patient coming from: Home  Chief Complaint: Wt-gain, edema, dyspnea  HPI: Melissa [name] is a 83 y.o. female with medical history significant for hypertension, COPD, insulin-dependent diabetes mellitus, coronary artery disease, and chronic combined systolic/diastolic CHF, now presenting to the emergency department for evaluation of exertional dyspnea, edema, and weight gain. Tissue was admitted to the hospital in July 2018 with acute CHF, was discharged to a nursing facility, did well initially, but has since become increasingly edematous and dyspneic. Weight is up 23 pounds since the discharge 56 weeks ago. She denies chest pain, fevers, or chills.   ED Course: Upon arrival to the ED, patient is found to be afebrile, saturating adequately on room air, tachypneic, and hypertensive to 150/100. EKG features atrial fibrillation and chest x-rays notable for stable cardiomegaly, pulmonary vascular congestion, and mild chronic interstitial lung disease. Chemistry panel reveals a BUN of 78 and creatinine of 2.22 which appears stable. CBC was notable for unimproved normocytic anemia with hemoglobin of 10.6, and a new mild thrombocytopenia with platelets 127,000. INR is subtherapeutic at 1.64, troponin is elevated to 0.05 but lower than any of her priors in the past year, and BNP is elevated to 871. Patient was treated with 40 mg of IV Lasix in the ED. She remained mildly hypertensive, but otherwise fairly stable, and will be admitted to the telemetry unit for ongoing evaluation and management of dyspnea, edema, and weight gain secondary to acute on chronic combined CHF.  Review of Systems:  All other systems reviewed and apart from HPI, are negative.  Past Medical History:  Diagnosis Date  . Anemia   . Atherosclerotic heart disease   . Atrial fibrillation (HCC)   . CAD (coronary  artery disease)    Multivessel status post high risk PCI/DES to circumflex 11/2015 (poor candidate for CABG)  . CHF (congestive heart failure) (HCC)   . CKD (chronic kidney disease) stage 3, GFR 30-59 ml/min   . DDD (degenerative disc disease), lumbar   . Diabetic neuropathy (HCC)   . Essential hypertension, benign   . High cholesterol   . Klebsiella pneumonia (HCC)   . Mixed hyperlipidemia   . Nonrheumatic tricuspid (valve) insufficiency   . On home O2   . Type 2 diabetes mellitus (HCC)   . Unstable angina [facility](HCC)     Past Surgical History:  Procedure Laterality Date  . CARDIAC CATHETERIZATION N/A 11/26/2015   Procedure: Right/Left Heart Cath and Coronary Angiography;  Surgeon: Yvonne Kendallhristopher End, MD;  [facility];  Service: Cardiovascular;  Laterality: N/A;  . CARDIAC CATHETERIZATION N/A 12/01/2015   Procedure: Coronary Stent Intervention Rotoblader;  Surgeon: Tonny BollmanMichael Cooper, MD;  [facility];  Service: Cardiovascular;  Laterality: N/A;  . TOTAL HIP ARTHROPLASTY  01/14/03   Left     reports that she has never smoked. She has never used smokeless tobacco. She reports that she does not drink alcohol or use drugs.  No Known Allergies  Family History  Problem Relation Age of Onset  . Diabetes type II Mother   . Diabetes type II Father   . Hypertension Father      Prior to Admission medications   Medication Sig Start Date End Date Taking? Authorizing Provider  acetaminophen (TYLENOL) 325 MG tablet Take 2 tablets (650 mg total) by mouth every 4 (four) hours as needed for mild pain, moderate  pain, fever or headache. 12/05/15  Yes Kilroy, Luke K, PA-C  albuterol (PROVENTIL) (2.5 MG/3ML) 0.083% nebulizer solution Take 3 mLs (2.5 mg total) by nebulization every 6 (six) hours as needed for wheezing or shortness of breath. 09/02/16  Yes Kari Baars, MD  amLODipine (NORVASC) 5 MG tablet Take 1 tablet (5 mg total) by mouth daily. 09/02/16 09/02/17 Yes  Kari Baars, MD  atorvastatin (LIPITOR) 40 MG tablet Take 1 tablet (40 mg total) by mouth daily. 12/06/15  Yes Kilroy, Luke K, PA-C  bisacodyl (DULCOLAX) 10 MG suppository Place 1 suppository (10 mg total) rectally daily as needed for moderate constipation. 09/02/16  Yes Kari Baars, MD  bisacodyl (DULCOLAX) 5 MG EC tablet Take 1 tablet (5 mg total) by mouth daily as needed for moderate constipation. 09/02/16  Yes Kari Baars, MD  clopidogrel (PLAVIX) 75 MG tablet Take 1 tablet (75 mg total) by mouth daily. 12/06/15  Yes Kilroy, Luke K, PA-C  hydrALAZINE (APRESOLINE) 100 MG tablet Take 1 tablet (100 mg total) by mouth 3 (three) times daily. 12/05/15  Yes Kilroy, Eda Paschal, PA-C  HYDROcodone-acetaminophen (NORCO/VICODIN) 5-325 MG tablet Take 1 tablet by mouth 4 (four) times daily as needed for moderate pain. 09/02/16  Yes Kari Baars, MD  insulin aspart (NOVOLOG) 100 UNIT/ML injection Inject 0-15 Units into the skin 3 (three) times daily with meals. 09/02/16  Yes Kari Baars, MD  insulin aspart (NOVOLOG) 100 UNIT/ML injection Inject 0-5 Units into the skin at bedtime. 09/02/16  Yes Kari Baars, MD  insulin aspart (NOVOLOG) 100 UNIT/ML injection Inject 4 Units into the skin 3 (three) times daily with meals. 09/02/16  Yes Kari Baars, MD  isosorbide mononitrate (IMDUR) 60 MG 24 hr tablet Take 1 tablet (60 mg total) by mouth daily. 12/06/15  Yes Kilroy, Luke K, PA-C  nitroGLYCERIN (NITROSTAT) 0.4 MG SL tablet Place 1 tablet (0.4 mg total) under the tongue every 5 (five) minutes as needed for chest pain. 12/05/15  Yes Kilroy, Luke K, PA-C  pantoprazole (PROTONIX) 40 MG tablet Take 1 tablet (40 mg total) by mouth daily at 6 (six) AM. 12/06/15  Yes Kilroy, Eda Paschal, PA-C  primidone (MYSOLINE) 50 MG tablet Take 1 tablet (50 mg total) by mouth daily. 09/02/16  Yes Kari Baars, MD  PROAIR HFA 108 [phone] Base) MCG/ACT inhaler Inhale 1-2 puffs into the lungs 4 (four) times daily.  08/11/16  Yes  [provider]  torsemide (DEMADEX) 20 MG tablet Take 1 tablet (20 mg total) by mouth daily. 09/02/16  Yes Kari Baars, MD  TRAVATAN Z 0.004 % SOLN ophthalmic solution Place 1 drop into both eyes at bedtime.  11/08/14  Yes [provider]  umeclidinium bromide (INCRUSE ELLIPTA) 62.5 MCG/INH AEPB Inhale 1 puff into the lungs daily.   Yes [provider]  Vitamin D, Ergocalciferol, (DRISDOL) 50000 units CAPS capsule Take 1 capsule (50,000 Units total) by mouth every 7 (seven) days. 09/07/16  Yes Kari Baars, MD  warfarin (COUMADIN) 5 MG tablet TAKE 1 TABLET BY MOUTH DAILY EXCEPT 1/2 TABLET ON [facility] AND SATURDAYS. 07/11/16  Yes Jonelle Sidle, MD  ALPRAZolam Prudy Feeler) 0.25 MG tablet Take 0.25 mg by mouth 3 (three) times daily as needed. 11/19/15   [provider]  ferumoxytol 510 mg in sodium chloride 0.9 % 100 mL Inject 510 mg into the vein once a week. Patient not taking: Reported on 10/11/2016 09/08/16   Kari Baars, MD  gabapentin (NEURONTIN) 100 MG capsule Take 100 mg by mouth at  bedtime. Take 2 tabs at night    [provider]    Physical Exam: Vitals:   October 23, 2016 1252 Oct 23, 2016 1400 11/01/2016 1430 10/16/2016 1500  BP:  (!) 152/70 (!) 160/99 (!) 148/102  Pulse: 75 71 70 80  Resp: (!) Temp: 98.1 F (36.7 C)     TempSrc: Oral     SpO2: 97% 100% 100% 97%  Weight:      Height:          Constitutional: No acute distress, calm, chronically-ill appearing, edematous Eyes: PERTLA, lids and conjunctivae normal ENMT: Mucous membranes are moist. Posterior pharynx clear of any exudate or lesions.   Neck: normal, supple, no masses, no thyromegaly Respiratory: Diminished bilaterally with fine rales diffusely. No pallor. No accessory muscle use.  Cardiovascular: Rate ~80 and irregular. Marked pitting edema to upper and lower extremities. JVP not well-visualized. Abdomen: No distension, no tenderness, no masses palpated. Bowel sounds  active.  Musculoskeletal: no clubbing / cyanosis. No joint deformity upper and lower extremities.   Skin: Chronic ulceration to anterior lower leg without drainage, erythema, or odor. Warm, dry, well-perfused. Neurologic: CN 2-12 grossly intact. Sensation intact. Strength 5/5 in all 4 limbs.  Psychiatric: Alert and oriented x 3. Calm, cooperative.     Labs on Admission: I have personally reviewed following labs and imaging studies  CBC:  Recent Labs Lab Oct 23, 2016 1328  WBC 7.9  NEUTROABS 5.1  HGB 10.6*  HCT 33.2*  MCV 99.4  PLT 127*   Basic Metabolic Panel:  Recent Labs Lab 10/15/2016 1328  NA 143  K 3.9  CL 105  CO2 28  GLUCOSE 166*  BUN 78*  CREATININE 2.22*  CALCIUM 8.8*   GFR: Estimated Creatinine Clearance: 23.2 mL/min (A) (by C-G formula based on SCr of 2.22 mg/dL (H)). Liver Function Tests: No results for input(s): AST, ALT, ALKPHOS, BILITOT, PROT, ALBUMIN in the last 168 hours. No results for input(s): LIPASE, AMYLASE in the last 168 hours. No results for input(s): AMMONIA in the last 168 hours. Coagulation Profile:  Recent Labs Lab 11/01/2016 1328  INR 1.64   Cardiac Enzymes:  Recent Labs Lab 11/02/2016 1328  TROPONINI 0.05*   BNP (last 3 results) No results for input(s): PROBNP in the last 8760 hours. HbA1C: No results for input(s): HGBA1C in the last 72 hours. CBG: No results for input(s): GLUCAP in the last 168 hours. Lipid Profile: No results for input(s): CHOL, HDL, LDLCALC, TRIG, CHOLHDL, LDLDIRECT in the last 72 hours. Thyroid Function Tests: No results for input(s): TSH, T4TOTAL, FREET4, T3FREE, THYROIDAB in the last 72 hours. Anemia Panel: No results for input(s): VITAMINB12, FOLATE, FERRITIN, TIBC, IRON, RETICCTPCT in the last 72 hours. Urine analysis:    Component Value Date/Time   COLORURINE YELLOW 09/13/2016 1200   APPEARANCEUR CLOUDY (A) 09/13/2016 1200   LABSPEC 1.009 09/13/2016 1200   PHURINE 5.0 09/13/2016 1200   GLUCOSEU  NEGATIVE 09/13/2016 1200   HGBUR NEGATIVE 09/13/2016 1200   BILIRUBINUR NEGATIVE 09/13/2016 1200   KETONESUR NEGATIVE 09/13/2016 1200   PROTEINUR NEGATIVE 09/13/2016 1200   NITRITE NEGATIVE 09/13/2016 1200   LEUKOCYTESUR LARGE (A) 09/13/2016 1200   Sepsis Labs: (procalcitonin:4,lacticidven:4) )No results found for this or any previous visit (from the past 240 hour(s)).   Radiological Exams on Admission: Dg Chest Port 1 View  Result Date: 23-Oct-2016 CLINICAL DATA:  Diffuse body edema, weight gain and shortness of breath for several days. EXAM: PORTABLE CHEST 1 VIEW COMPARISON:  08/22/2016.  FINDINGS: Poor inspiration. Stable enlarged cardiac silhouette. No significant change and prominence of the pulmonary vasculature and interstitial markings taking the decreased inspiration into account. New linear densities in both mid lung zones. Aortic arch calcifications. No acute bony abnormality. IMPRESSION: 1. Poor inspiration with interval mild linear atelectasis in both mid lung zones. 2. Stable cardiomegaly, pulmonary vascular congestion and mild chronic interstitial lung disease. Electronically Signed   By: Beckie Salts M.D.   On: 11/02/2016 13:48    EKG: Independently reviewed. Atrial fibrillation.   Assessment/Plan  1. Acute on chronic diastolic CHF - Pt presents with worsening DOE, edema, and wt gain  - Found to be markedly edematous, wt up >20 lbs in last 5-6 weeks, and with elevated BNP and rales  - Echo from July '18 with preserved EF, moderate LVH, mild MR, severe TR, severe elevation in PA pressures, and massive LAE  - She is managed at home with torsemide 20 mg BID  - Treated in ED with Lasix 40 mg IV  - Plan to continue cardiac monitoring, SLIV, fluid-restrict diet, follow strict I/O's and daily wts, and daily BMP    2. CKD stage IV - SCr is 2.22 on admission, consistent with her apparent baseline  - Follow daily chem panel during diuresis, renally-dose medications   3.  Persistent atrial fibrillation - In rate-controled a fib on admission  - CHADS-VASc is 80 (age x2, gender, CAD, CHF, DM)  - Continue warfarin, INR 1.64, will bridge with heparin   4. CAD - No anginal complaints  - Slight troponin elevation noted, lowest value in past year  - Continue statin, Plavix, nitrates, CCB   5. Hypertension with hypertensive urgency  - BP 150/100 range in ED  - Likely secondary to acute CHF  - Continue Norvasc and hydralazine, diurese as above   6. Anemia, thrombocytopenia   - Hgb 10.6 on admission, higher than recent priors and with no bleeding evident, likely secondary to CKD  - New mild thrombocytopenia noted with platelets 127,000; will repeat CBC in am   7. Insulin-dependent DM  - A1c was >15.5% in June '18 - Managed at home with Novolog 4 units with meals, plus 0-15 TID per sliding-scale  - Check CBG with meals and qHS  - Treat with Novolog correctional    DVT prophylaxis: warfarin Code Status: DNR Family Communication: Family updated at bedside Disposition Plan: Admit to telemetry Consults called: None Admission status: Inpatient    Briscoe Deutscher, MD [facility] Pager [phone]  If 7PM-7AM, please contact night-coverage [email or web address] Password [email or web address]  11/06/2016, 3:25 PM

## 2016-10-14 NOTE — ED Notes (Signed)
Wick to pt to collect urine

## 2016-10-14 NOTE — ED Notes (Signed)
Dr Clarene Dukemcmanus notified of Troponin 0.05

## 2016-10-14 NOTE — ED Notes (Signed)
hospitalist at bedside

## 2016-10-14 NOTE — ED Triage Notes (Signed)
Per patient's family increase in generalized edema. [facility]-CHF, venous insufficiency, and kidney disease. Patient has gained 4-5lbs in past 2-3 days per family. +4 edema noted on left extremities and +3 to left extremities. Patient short of breath with labored respirations. Patient wears 2 liters of oxygen at all times via [place].

## 2016-10-14 NOTE — Progress Notes (Signed)
ANTICOAGULATION CONSULT NOTE - Initial Consult  Pharmacy Consult for Coumadin and Heparin Indication: atrial fibrillation  No Known Allergies  Patient Measurements: Height: 5\' 4"  (162.6 cm) Weight: 240 lb (108.9 kg) IBW/kg (Calculated) : 54.7 HEPARIN DW (KG): 80.5  Vital Signs: Temp: 98.1 F (36.7 C) (09/07 1252) Temp Source: Oral (09/07 1252) BP: 148/102 (09/07 1500) Pulse Rate: 80 (09/07 1500)  Labs:  Recent Labs  04/10/2016 1328  HGB 10.6*  HCT 33.2*  PLT 127*  LABPROT 19.3*  INR 1.64  CREATININE 2.22*  TROPONINI 0.05*   Estimated Creatinine Clearance: 23.2 mL/min (A) (by C-G formula based on SCr of 2.22 mg/dL (H)).  Medical History: Past Medical History:  Diagnosis Date  . Anemia   . Atherosclerotic heart disease   . Atrial fibrillation (HCC)   . CAD (coronary artery disease)    Multivessel status post high risk PCI/DES to circumflex 11/2015 (poor candidate for CABG)  . CHF (congestive heart failure) (HCC)   . CKD (chronic kidney disease) stage 3, GFR 30-59 ml/min   . DDD (degenerative disc disease), lumbar   . Diabetic neuropathy (HCC)   . Essential hypertension, benign   . High cholesterol   . Klebsiella pneumonia (HCC)   . Mixed hyperlipidemia   . Nonrheumatic tricuspid (valve) insufficiency   . On home O2   . Type 2 diabetes mellitus (HCC)   . Unstable angina (HCC)    Medications:   (Not in a hospital admission)  Assessment: 83yo female on chronic Coumadin PTA for h/o afib.  INR is subtherapeutic on admission.  Asked to initiate Heparin until INR at goal.   Goal of Therapy:  INR 2-3 Heparin level 0.3-0.7 units/ml Monitor platelets by anticoagulation protocol: Yes   Plan:  Coumadin 5mg  today x 1 Heparin 2000 units x 1 then 1200 units/hr Heparin level, CBC, INR daily Monitor for s/sx bleeding complications  Valrie HartHall, Willeen Novak A 10/25/2016,3:38 PM

## 2016-10-14 NOTE — ED Notes (Signed)
Heart sounds muffled

## 2016-10-14 NOTE — ED Notes (Signed)
Report to Brittney, RN

## 2016-10-14 NOTE — Telephone Encounter (Signed)
Marisue IvanLiz with [facility] called to say patient had swollen arms and legs, had DOE.Weight at home is 240 lbs.Last OV in July, weight was 217 lbs.Recent hospitalization, discharge 09/02/16.   I advise them to take patient to ED for evaluation    Will FYI Dr.McDowell

## 2016-10-15 ENCOUNTER — Inpatient Hospital Stay (HOSPITAL_COMMUNITY): Payer: Medicare Other

## 2016-10-15 DIAGNOSIS — I340 Nonrheumatic mitral (valve) insufficiency: Secondary | ICD-10-CM

## 2016-10-15 LAB — CBC
HEMATOCRIT: 30.8 % — AB (ref 36.0–46.0)
HEMATOCRIT: 31.3 % — AB (ref 36.0–46.0)
HEMOGLOBIN: 9.8 g/dL — AB (ref 12.0–15.0)
HEMOGLOBIN: 9.9 g/dL — AB (ref 12.0–15.0)
MCH: 31.9 pg (ref 26.0–34.0)
MCH: 31.6 pg (ref 26.0–34.0)
MCHC: 31.8 g/dL (ref 30.0–36.0)
MCHC: 31.6 g/dL (ref 30.0–36.0)
MCV: 100.3 fL — ABNORMAL HIGH (ref 78.0–100.0)
MCV: 100 fL (ref 78.0–100.0)
Platelets: 105 10*3/uL — ABNORMAL LOW (ref 150–400)
Platelets: 124 10*3/uL — ABNORMAL LOW (ref 150–400)
RBC: 3.07 MIL/uL — AB (ref 3.87–5.11)
RBC: 3.13 MIL/uL — AB (ref 3.87–5.11)
RDW: 16.1 % — ABNORMAL HIGH (ref 11.5–15.5)
RDW: 16.1 % — ABNORMAL HIGH (ref 11.5–15.5)
WBC: 6.4 10*3/uL (ref 4.0–10.5)
WBC: 6.4 10*3/uL (ref 4.0–10.5)

## 2016-10-15 LAB — GLUCOSE, CAPILLARY
GLUCOSE-CAPILLARY: 149 mg/dL — AB (ref 65–99)
Glucose-Capillary: 147 mg/dL — ABNORMAL HIGH (ref 65–99)
Glucose-Capillary: 141 mg/dL — ABNORMAL HIGH (ref 65–99)
Glucose-Capillary: 192 mg/dL — ABNORMAL HIGH (ref 65–99)

## 2016-10-15 LAB — ECHOCARDIOGRAM COMPLETE
Height: 64 in
WEIGHTICAEL: 3860.8 [oz_av]

## 2016-10-15 LAB — BASIC METABOLIC PANEL
ANION GAP: 10 (ref 5–15)
BUN: 74 mg/dL — AB (ref 6–20)
CALCIUM: 8.5 mg/dL — AB (ref 8.9–10.3)
CO2: 29 mmol/L (ref 22–32)
Chloride: 105 mmol/L (ref 101–111)
Creatinine, Ser: 2.15 mg/dL — ABNORMAL HIGH (ref 0.44–1.00)
GFR calc Af Amer: 23 mL/min — ABNORMAL LOW (ref >60)
GFR calc non Af Amer: 20 mL/min — ABNORMAL LOW (ref >60)
GLUCOSE: 166 mg/dL — AB (ref 65–99)
Potassium: 4 mmol/L (ref 3.5–5.1)
Sodium: 144 mmol/L (ref 135–145)

## 2016-10-15 LAB — ABO/RH: ABO/RH(D): O POS

## 2016-10-15 LAB — PROTIME-INR
INR: 1.72
PROTHROMBIN TIME: 20 s — AB (ref 11.4–15.2)

## 2016-10-15 LAB — HEPARIN LEVEL (UNFRACTIONATED): HEPARIN UNFRACTIONATED: 0.21 [IU]/mL — AB (ref 0.30–0.70)

## 2016-10-15 LAB — PREPARE RBC (CROSSMATCH)

## 2016-10-15 MED ORDER — IPRATROPIUM BROMIDE 0.02 % IN SOLN
RESPIRATORY_TRACT | Status: AC
  Administered 2016-10-15: 0.5 mg
  Filled 2016-10-15: qty 2.5

## 2016-10-15 MED ORDER — SODIUM CHLORIDE 0.9 % IV SOLN: Freq: Once | INTRAVENOUS | Status: DC

## 2016-10-15 MED ORDER — WARFARIN SODIUM 5 MG PO TABS: 5.0000 mg | ORAL_TABLET | Freq: Once | ORAL | Status: DC

## 2016-10-15 NOTE — Progress Notes (Addendum)
PICC site continues to bleed, MD notified.  Orders to hold coumadin and heparin at this time, Pressure dressing reinforced, sand bag applied

## 2016-10-15 NOTE — Progress Notes (Signed)
The patient was unable to follow directions on for use of Incruse DPI . She was also unable to generate adequate inhalation for use of the DPI.

## 2016-10-15 NOTE — Progress Notes (Signed)
PICC-Pt. Site was bleeding bedside RN nurse was changing dressing. I added a pressure dressing and changed the caps on the PICC line.  Site appears to have stopped bleeding at this time.

## 2016-10-15 NOTE — Progress Notes (Signed)
Called by RN due to profuse bleeding from recent PICC line site. Upon review of records it appears patient is receiving Coumadin and being bridged with heparin due to subtherapeutic INRs for atrial fibrillation. Also noted to have thrombocytopenia today with a platelet count of 105. Bleeding has not ceased with pressure dressings. Orders given to hold heparin and Coumadin. Stat CBC to monitor hemoglobin. Will hold 2 units of PRBCs in case she needs to be transfused. Chest x-ray post PICC placement confirms adequate placement. Continue pressure dressing. Have asked RN to notify me if any further issues.  Peggye PittEstela Hernandez, MD [facility] Pager: [phone]

## 2016-10-15 NOTE — Progress Notes (Signed)
Subjective: She was admitted yesterday with acute on chronic diastolic heart failure. At baseline she has heart failure coronary disease COPD chronic atrial fib chronic renal failure diabetes and has generally not done well for the last several months. She's had more issues with heart failure and with increasing problems with renal failure when she is diuresed. She has been to a skilled care facility but is now home. She says she feels a little bit better. She has received diuresis. She has an external catheter. She has peripheral IV. it is listed in her chart that she has a PICC line but that is incorrect  Objective: Vital signs in last 24 hours: Temp:  [98 F (36.7 C)-98.5 F (36.9 C)] 98.1 F (36.7 C) (09/08 0300) Pulse Rate:  [70-101] 80 (09/08 0300) Resp:  [10-30] 18 (09/08 0300) BP: (143-169)/(70-102) 169/88 (09/08 0300) SpO2:  [93 %-100 %] 100 % (09/08 0738) Weight:  [108.9 kg (240 lb)-110.4 kg (243 lb 4.8 oz)] 109.5 kg (241 lb 4.8 oz) (09/08 0500) Weight change:  Last BM Date: 10/13/2016  Intake/Output from previous day: 09/07 0701 - 09/08 0700 In: 200.8 [P.O.:60; I.V.:140.8] Out: 700 [Urine:700]  PHYSICAL EXAM General appearance: alert, cooperative and mild distress Resp: rales bilaterally Cardio: irregularly irregular rhythm GI: soft, non-tender; bowel sounds normal; no masses,  no organomegaly Extremities: She has diffuse edema in her arms and legs hips and abdomen She is very hard of hearing  Lab Results:  Results for orders placed or performed during the hospital encounter of 10/20/2016 (from the past 48 hour(s))  Basic metabolic panel     Status: Abnormal   Collection Time: 10/29/2016  1:28 PM  Result Value Ref Range   Sodium 143 135 - 145 mmol/L   Potassium 3.9 3.5 - 5.1 mmol/L   Chloride 105 101 - 111 mmol/L   CO2 28 22 - 32 mmol/L   Glucose, Bld 166 (H) 65 - 99 mg/dL   BUN 78 (H) 6 - 20 mg/dL   Creatinine, Ser 2.22 (H) 0.44 - 1.00 mg/dL   Calcium 8.8 (L) 8.9 -  10.3 mg/dL   GFR calc non Af Amer 19 (L) >60 mL/min   GFR calc Af Amer 22 (L) >60 mL/min    Comment: (NOTE) The eGFR has been calculated using the CKD EPI equation. This calculation has not been validated in all clinical situations. eGFR's persistently <60 mL/min signify possible Chronic Kidney Disease.    Anion gap 10 5 - 15  Brain natriuretic peptide     Status: Abnormal   Collection Time: 10/21/2016  1:28 PM  Result Value Ref Range   B Natriuretic Peptide 871.0 (H) 0.0 - 100.0 pg/mL  Troponin I     Status: Abnormal   Collection Time: 10/11/2016  1:28 PM  Result Value Ref Range   Troponin I 0.05 (HH) <0.03 ng/mL    Comment: CRITICAL RESULT CALLED TO, READ BACK BY AND VERIFIED WITH: GENTRY,R @ 1428 ON 9.7.18 BY BOWMAN,L   CBC with Differential     Status: Abnormal   Collection Time: 10/13/2016  1:28 PM  Result Value Ref Range   WBC 7.9 4.0 - 10.5 K/uL   RBC 3.34 (L) 3.87 - 5.11 MIL/uL   Hemoglobin 10.6 (L) 12.0 - 15.0 g/dL   HCT 33.2 (L) 36.0 - 46.0 %   MCV 99.4 78.0 - 100.0 fL   MCH 31.7 26.0 - 34.0 pg   MCHC 31.9 30.0 - 36.0 g/dL   RDW 16.1 (H) 11.5 -  15.5 %   Platelets 127 (L) 150 - 400 K/uL   Neutrophils Relative % 65 %   Neutro Abs 5.1 1.7 - 7.7 K/uL   Lymphocytes Relative 24 %   Lymphs Abs 1.9 0.7 - 4.0 K/uL   Monocytes Relative 9 %   Monocytes Absolute 0.7 0.1 - 1.0 K/uL   Eosinophils Relative 2 %   Eosinophils Absolute 0.2 0.0 - 0.7 K/uL   Basophils Relative 0 %   Basophils Absolute 0.0 0.0 - 0.1 K/uL  Protime-INR     Status: Abnormal   Collection Time: 10/27/2016  1:28 PM  Result Value Ref Range   Prothrombin Time 19.3 (H) 11.4 - 15.2 seconds   INR 1.64   Urinalysis, Routine w reflex microscopic     Status: Abnormal   Collection Time: 10/17/2016  4:05 PM  Result Value Ref Range   Color, Urine YELLOW YELLOW   APPearance HAZY (A) CLEAR   Specific Gravity, Urine 1.010 1.005 - 1.030   pH 5.0 5.0 - 8.0   Glucose, UA NEGATIVE NEGATIVE mg/dL   Hgb urine dipstick  NEGATIVE NEGATIVE   Bilirubin Urine NEGATIVE NEGATIVE   Ketones, ur NEGATIVE NEGATIVE mg/dL   Protein, ur NEGATIVE NEGATIVE mg/dL   Nitrite NEGATIVE NEGATIVE   Leukocytes, UA LARGE (A) NEGATIVE   RBC / HPF 0-5 0 - 5 RBC/hpf   WBC, UA 6-30 0 - 5 WBC/hpf   Bacteria, UA FEW (A) NONE SEEN   Squamous Epithelial / LPF 0-5 (A) NONE SEEN  Glucose, capillary     Status: Abnormal   Collection Time: 10/27/2016  7:37 PM  Result Value Ref Range   Glucose-Capillary 227 (H) 65 - 99 mg/dL  Glucose, capillary     Status: Abnormal   Collection Time: 10/15/16  7:28 AM  Result Value Ref Range   Glucose-Capillary 149 (H) 65 - 99 mg/dL   Comment 1 Notify RN    Comment 2 Document in Chart     ABGS No results for input(s): PHART, PO2ART, TCO2, HCO3 in the last 72 hours.  Invalid input(s): PCO2 CULTURES No results found for this or any previous visit (from the past 240 hour(s)). Studies/Results: Dg Chest Port 1 View  Result Date: 11/05/2016 CLINICAL DATA:  Diffuse body edema, weight gain and shortness of breath for several days. EXAM: PORTABLE CHEST 1 VIEW COMPARISON:  08/22/2016. FINDINGS: Poor inspiration. Stable enlarged cardiac silhouette. No significant change and prominence of the pulmonary vasculature and interstitial markings taking the decreased inspiration into account. New linear densities in both mid lung zones. Aortic arch calcifications. No acute bony abnormality. IMPRESSION: 1. Poor inspiration with interval mild linear atelectasis in both mid lung zones. 2. Stable cardiomegaly, pulmonary vascular congestion and mild chronic interstitial lung disease. Electronically Signed   By: Claudie Revering M.D.   On: 10/13/2016 13:48    Medications:  Prior to Admission:  Prescriptions Prior to Admission  Medication Sig Dispense Refill Last Dose  . acetaminophen (TYLENOL) 325 MG tablet Take 2 tablets (650 mg total) by mouth every 4 (four) hours as needed for mild pain, moderate pain, fever or headache.    unknown  . albuterol (PROVENTIL) (2.5 MG/3ML) 0.083% nebulizer solution Take 3 mLs (2.5 mg total) by nebulization every 6 (six) hours as needed for wheezing or shortness of breath. 75 mL 12 10/13/2016 at Unknown time  . amLODipine (NORVASC) 5 MG tablet Take 1 tablet (5 mg total) by mouth daily. 30 tablet 11 10/29/2016 at Unknown time  .  atorvastatin (LIPITOR) 40 MG tablet Take 1 tablet (40 mg total) by mouth daily. 90 tablet 3 10/13/2016 at Unknown time  . bisacodyl (DULCOLAX) 10 MG suppository Place 1 suppository (10 mg total) rectally daily as needed for moderate constipation. 12 suppository 0   . bisacodyl (DULCOLAX) 5 MG EC tablet Take 1 tablet (5 mg total) by mouth daily as needed for moderate constipation. 30 tablet 0   . clopidogrel (PLAVIX) 75 MG tablet Take 1 tablet (75 mg total) by mouth daily. 90 tablet 3 10/13/2016 at 1600  . hydrALAZINE (APRESOLINE) 100 MG tablet Take 1 tablet (100 mg total) by mouth 3 (three) times daily. 270 tablet 3 10/26/2016 at Unknown time  . HYDROcodone-acetaminophen (NORCO/VICODIN) 5-325 MG tablet Take 1 tablet by mouth 4 (four) times daily as needed for moderate pain. 30 tablet 0 11/01/2016 at Unknown time  . insulin aspart (NOVOLOG) 100 UNIT/ML injection Inject 0-15 Units into the skin 3 (three) times daily with meals. 10 mL 11   . insulin aspart (NOVOLOG) 100 UNIT/ML injection Inject 0-5 Units into the skin at bedtime. 10 mL 11 10/13/2016 at Unknown time  . insulin aspart (NOVOLOG) 100 UNIT/ML injection Inject 4 Units into the skin 3 (three) times daily with meals. 10 mL 11 11/03/2016 at Unknown time  . isosorbide mononitrate (IMDUR) 60 MG 24 hr tablet Take 1 tablet (60 mg total) by mouth daily. 90 tablet 3 10/16/2016 at Unknown time  . nitroGLYCERIN (NITROSTAT) 0.4 MG SL tablet Place 1 tablet (0.4 mg total) under the tongue every 5 (five) minutes as needed for chest pain. 25 tablet 2 unknown  . pantoprazole (PROTONIX) 40 MG tablet Take 1 tablet (40 mg total) by mouth daily at 6  (six) AM. 90 tablet 3 11/03/2016 at Unknown time  . primidone (MYSOLINE) 50 MG tablet Take 1 tablet (50 mg total) by mouth daily.   11/06/2016 at Unknown time  . PROAIR HFA 108 (90 Base) MCG/ACT inhaler Inhale 1-2 puffs into the lungs 4 (four) times daily.    10/18/2016 at Unknown time  . torsemide (DEMADEX) 20 MG tablet Take 1 tablet (20 mg total) by mouth daily.   10/26/2016 at Unknown time  . TRAVATAN Z 0.004 % SOLN ophthalmic solution Place 1 drop into both eyes at bedtime.    10/13/2016 at Unknown time  . umeclidinium bromide (INCRUSE ELLIPTA) 62.5 MCG/INH AEPB Inhale 1 puff into the lungs daily.   10/13/2016 at Unknown time  . Vitamin D, Ergocalciferol, (DRISDOL) 50000 units CAPS capsule Take 1 capsule (50,000 Units total) by mouth every 7 (seven) days. 30 capsule  Past Week at Unknown time  . warfarin (COUMADIN) 5 MG tablet TAKE 1 TABLET BY MOUTH DAILY EXCEPT 1/2 TABLET ON WEDNESDAYS AND SATURDAYS. 30 tablet 6 10/13/2016 at 1700  . ALPRAZolam (XANAX) 0.25 MG tablet Take 0.25 mg by mouth 3 (three) times daily as needed.   Not Taking at Unknown time  . ferumoxytol 510 mg in sodium chloride 0.9 % 100 mL Inject 510 mg into the vein once a week. (Patient not taking: Reported on 10/09/2016)   Not Taking at Unknown time  . gabapentin (NEURONTIN) 100 MG capsule Take 100 mg by mouth at bedtime. Take 2 tabs at night   Not Taking at Unknown time   Scheduled: . amLODipine  5 mg Oral Daily  . atorvastatin  40 mg Oral q1800  . clopidogrel  75 mg Oral Daily  . furosemide  40 mg Intravenous Q12H  . gabapentin  100 mg Oral QHS  . hydrALAZINE  100 mg Oral TID  . insulin aspart  0-5 Units Subcutaneous QHS  . insulin aspart  0-9 Units Subcutaneous TID WC  . isosorbide mononitrate  60 mg Oral Daily  . latanoprost  1 drop Both Eyes QHS  . mouth rinse  15 mL Mouth Rinse BID  . pantoprazole  40 mg Oral Q0600  . primidone  50 mg Oral Daily  . sodium chloride flush  3 mL Intravenous Q12H  . umeclidinium bromide  1 puff  Inhalation Daily  . warfarin  5 mg Oral Once  . Warfarin - Pharmacist Dosing Inpatient   Does not apply Q24H   Continuous: . sodium chloride    . heparin 1,200 Units/hr (11/02/2016 1613)   TGY:BWLSLH chloride, acetaminophen, albuterol, ALPRAZolam, bisacodyl, hydrALAZINE, HYDROcodone-acetaminophen, nitroGLYCERIN, ondansetron (ZOFRAN) IV, sodium chloride flush  Assesment: She has acute on chronic diastolic heart failure. She has chronic kidney disease stage IV and concern has been about diuresis increasing her renal dysfunction which has been a problem. She has chronic atrial fibrillation and is chronically anticoagulated. She has coronary artery disease and had high risk PCI 12/01/2015. She has anemia of chronic disease. She has thrombocytopenia which is new. She has diabetes and she is on sliding scale. Principal Problem:   Acute on chronic diastolic CHF (congestive heart failure) (HCC) Active Problems:   Essential hypertension, benign   Persistent atrial fibrillation (HCC)   Chronic kidney disease (CKD), stage IV (severe) (HCC)   CAD S/P high risk PCI 12/01/15   Anemia of chronic disease   Diabetes mellitus type 2, controlled ([place])   Thrombocytopenia ([place])    Plan: Continue current treatments watch kidney function carefully. Her blood testing is pending from this morning    LOS: 1 day   Dennys Guin L 10/15/2016, 8:35 AM

## 2016-10-15 NOTE — Progress Notes (Signed)
  Echocardiogram 2D Echocardiogram has been performed.  Melissa [name], Melissa [name] R 10/15/2016, 3:29 PM

## 2016-10-15 NOTE — Progress Notes (Signed)
Profuse bleeding noted from PICC site.  Dressing saturated and becoming detached, gown soaked under arm from leakage. Pressure applied and vascular nurse called back in.  Dressing changed and and pressure dressing applied.

## 2016-10-16 LAB — CBC
HCT: 30.9 % — ABNORMAL LOW (ref 36.0–46.0)
Hemoglobin: 9.8 g/dL — ABNORMAL LOW (ref 12.0–15.0)
MCH: 32 pg (ref 26.0–34.0)
MCHC: 31.7 g/dL (ref 30.0–36.0)
MCV: 101 fL — ABNORMAL HIGH (ref 78.0–100.0)
PLATELETS: 117 10*3/uL — AB (ref 150–400)
RBC: 3.06 MIL/uL — ABNORMAL LOW (ref 3.87–5.11)
RDW: 16.3 % — ABNORMAL HIGH (ref 11.5–15.5)
WBC: 6.6 10*3/uL (ref 4.0–10.5)

## 2016-10-16 LAB — GLUCOSE, CAPILLARY
Glucose-Capillary: 142 mg/dL — ABNORMAL HIGH (ref 65–99)
Glucose-Capillary: 187 mg/dL — ABNORMAL HIGH (ref 65–99)
Glucose-Capillary: 167 mg/dL — ABNORMAL HIGH (ref 65–99)
Glucose-Capillary: 174 mg/dL — ABNORMAL HIGH (ref 65–99)

## 2016-10-16 LAB — BASIC METABOLIC PANEL
Anion gap: 7 (ref 5–15)
BUN: 76 mg/dL — AB (ref 6–20)
CALCIUM: 8.6 mg/dL — AB (ref 8.9–10.3)
CO2: 30 mmol/L (ref 22–32)
CREATININE: 2.14 mg/dL — AB (ref 0.44–1.00)
Chloride: 106 mmol/L (ref 101–111)
GFR calc Af Amer: 23 mL/min — ABNORMAL LOW (ref >60)
GFR, EST NON AFRICAN AMERICAN: 20 mL/min — AB (ref >60)
Glucose, Bld: 161 mg/dL — ABNORMAL HIGH (ref 65–99)
Potassium: 3.9 mmol/L (ref 3.5–5.1)
SODIUM: 143 mmol/L (ref 135–145)

## 2016-10-16 LAB — PROTIME-INR
INR: 1.67
PROTHROMBIN TIME: 19.6 s — AB (ref 11.4–15.2)

## 2016-10-16 LAB — HEPARIN LEVEL (UNFRACTIONATED)

## 2016-10-16 MED ORDER — SODIUM CHLORIDE 0.9% FLUSH
10.0000 mL | INTRAVENOUS | Status: DC | PRN
  Administered 2016-10-22: 10 mL
  Filled 2016-10-16: qty 40

## 2016-10-16 MED ORDER — ALBUTEROL SULFATE (2.5 MG/3ML) 0.083% IN NEBU
2.5000 mg | INHALATION_SOLUTION | Freq: Four times a day (QID) | RESPIRATORY_TRACT | Status: DC
  Administered 2016-10-16 (×3): 2.5 mg via RESPIRATORY_TRACT
  Filled 2016-10-16 (×3): qty 3

## 2016-10-16 MED ORDER — ALBUTEROL SULFATE (2.5 MG/3ML) 0.083% IN NEBU
2.5000 mg | INHALATION_SOLUTION | Freq: Three times a day (TID) | RESPIRATORY_TRACT | Status: DC
  Administered 2016-10-17 – 2016-10-21 (×13): 2.5 mg via RESPIRATORY_TRACT
  Filled 2016-10-16 (×14): qty 3

## 2016-10-16 MED ORDER — SODIUM CHLORIDE 0.9% FLUSH
10.0000 mL | Freq: Two times a day (BID) | INTRAVENOUS | Status: DC
  Administered 2016-10-16 (×2): 20 mL
  Administered 2016-10-17 – 2016-10-23 (×11): 10 mL

## 2016-10-16 MED ORDER — FUROSEMIDE 10 MG/ML IJ SOLN
80.0000 mg | Freq: Two times a day (BID) | INTRAMUSCULAR | Status: DC
  Administered 2016-10-16 – 2016-10-19 (×5): 80 mg via INTRAVENOUS
  Filled 2016-10-16 (×6): qty 8

## 2016-10-16 MED ORDER — ALBUTEROL SULFATE (2.5 MG/3ML) 0.083% IN NEBU
2.5000 mg | INHALATION_SOLUTION | RESPIRATORY_TRACT | Status: DC | PRN
  Administered 2016-10-19: 2.5 mg via RESPIRATORY_TRACT
  Filled 2016-10-16: qty 3

## 2016-10-16 NOTE — Progress Notes (Signed)
Subjective: Events of yesterday noted. IV access was very poor and we had difficulty getting blood from her yesterday so she has another PICC line placed now. She had significant bleeding but her hemoglobin level is stable. She has stopped bleeding. She is off heparin and Coumadin for now. She has diuresed but not very much. She says she feels okay. She has no other new complaints. No chest pain. No nausea no vomiting.  Objective: Vital signs in last 24 hours: Temp:  [97.4 F (36.3 C)-98.9 F (37.2 C)] 97.4 F (36.3 C) (09/09 0358) Pulse Rate:  [77-84] 77 (09/09 0358) Resp:  [20] 20 (09/09 0358) BP: (117-159)/(78-90) 150/90 (09/09 0358) SpO2:  [90 %-100 %] 99 % (09/09 0756) Weight:  [110.2 kg (242 lb 14.4 oz)] 110.2 kg (242 lb 14.4 oz) (09/09 0358) Weight change: 1.315 kg (2 lb 14.4 oz) Last BM Date: 11/06/2016  Intake/Output from previous day: 09/08 0701 - 09/09 0700 In: 360 [P.O.:360] Out: 600 [Urine:600]  PHYSICAL EXAM General appearance: alert, cooperative and mild distress Resp: rales bilaterally Cardio: irregularly irregular rhythm GI: soft, non-tender; bowel sounds normal; no masses,  no organomegaly Extremities: She has diffuse edema in her arms legs hips and abdomen She is very hard of hearing  Lab Results:  Results for orders placed or performed during the hospital encounter of 11/04/2016 (from the past 48 hour(s))  Basic metabolic panel     Status: Abnormal   Collection Time: 11/02/2016  1:28 PM  Result Value Ref Range   Sodium 143 135 - 145 mmol/L   Potassium 3.9 3.5 - 5.1 mmol/L   Chloride 105 101 - 111 mmol/L   CO2 28 22 - 32 mmol/L   Glucose, Bld 166 (H) 65 - 99 mg/dL   BUN 78 (H) 6 - 20 mg/dL   Creatinine, Ser 2.22 (H) 0.44 - 1.00 mg/dL   Calcium 8.8 (L) 8.9 - 10.3 mg/dL   GFR calc non Af Amer 19 (L) >60 mL/min   GFR calc Af Amer 22 (L) >60 mL/min    Comment: (NOTE) The eGFR has been calculated using the CKD EPI equation. This calculation has not been  validated in all clinical situations. eGFR's persistently <60 mL/min signify possible Chronic Kidney Disease.    Anion gap 10 5 - 15  Brain natriuretic peptide     Status: Abnormal   Collection Time: 11/01/2016  1:28 PM  Result Value Ref Range   B Natriuretic Peptide 871.0 (H) 0.0 - 100.0 pg/mL  Troponin I     Status: Abnormal   Collection Time: 11/02/2016  1:28 PM  Result Value Ref Range   Troponin I 0.05 (HH) <0.03 ng/mL    Comment: CRITICAL RESULT CALLED TO, READ BACK BY AND VERIFIED WITH: GENTRY,R @ 1428 ON 9.7.18 BY BOWMAN,L   CBC with Differential     Status: Abnormal   Collection Time: 11/05/2016  1:28 PM  Result Value Ref Range   WBC 7.9 4.0 - 10.5 K/uL   RBC 3.34 (L) 3.87 - 5.11 MIL/uL   Hemoglobin 10.6 (L) 12.0 - 15.0 g/dL   HCT 33.2 (L) 36.0 - 46.0 %   MCV 99.4 78.0 - 100.0 fL   MCH 31.7 26.0 - 34.0 pg   MCHC 31.9 30.0 - 36.0 g/dL   RDW 16.1 (H) 11.5 - 15.5 %   Platelets 127 (L) 150 - 400 K/uL   Neutrophils Relative % 65 %   Neutro Abs 5.1 1.7 - 7.7 K/uL   Lymphocytes Relative 24 %  Lymphs Abs 1.9 0.7 - 4.0 K/uL   Monocytes Relative 9 %   Monocytes Absolute 0.7 0.1 - 1.0 K/uL   Eosinophils Relative 2 %   Eosinophils Absolute 0.2 0.0 - 0.7 K/uL   Basophils Relative 0 %   Basophils Absolute 0.0 0.0 - 0.1 K/uL  Protime-INR     Status: Abnormal   Collection Time: 10/31/2016  1:28 PM  Result Value Ref Range   Prothrombin Time 19.3 (H) 11.4 - 15.2 seconds   INR 1.64   Urinalysis, Routine w reflex microscopic     Status: Abnormal   Collection Time: 10/22/2016  4:05 PM  Result Value Ref Range   Color, Urine YELLOW YELLOW   APPearance HAZY (A) CLEAR   Specific Gravity, Urine 1.010 1.005 - 1.030   pH 5.0 5.0 - 8.0   Glucose, UA NEGATIVE NEGATIVE mg/dL   Hgb urine dipstick NEGATIVE NEGATIVE   Bilirubin Urine NEGATIVE NEGATIVE   Ketones, ur NEGATIVE NEGATIVE mg/dL   Protein, ur NEGATIVE NEGATIVE mg/dL   Nitrite NEGATIVE NEGATIVE   Leukocytes, UA LARGE (A) NEGATIVE   RBC  / HPF 0-5 0 - 5 RBC/hpf   WBC, UA 6-30 0 - 5 WBC/hpf   Bacteria, UA FEW (A) NONE SEEN   Squamous Epithelial / LPF 0-5 (A) NONE SEEN  Glucose, capillary     Status: Abnormal   Collection Time: 11/03/2016  7:37 PM  Result Value Ref Range   Glucose-Capillary 227 (H) 65 - 99 mg/dL  Glucose, capillary     Status: Abnormal   Collection Time: 10/15/16  7:28 AM  Result Value Ref Range   Glucose-Capillary 149 (H) 65 - 99 mg/dL   Comment 1 Notify RN    Comment 2 Document in Chart   Glucose, capillary     Status: Abnormal   Collection Time: 10/15/16 11:20 AM  Result Value Ref Range   Glucose-Capillary 147 (H) 65 - 99 mg/dL   Comment 1 Notify RN    Comment 2 Document in Chart   Basic metabolic panel     Status: Abnormal   Collection Time: 10/15/16  3:02 PM  Result Value Ref Range   Sodium 144 135 - 145 mmol/L   Potassium 4.0 3.5 - 5.1 mmol/L   Chloride 105 101 - 111 mmol/L   CO2 29 22 - 32 mmol/L   Glucose, Bld 166 (H) 65 - 99 mg/dL   BUN 74 (H) 6 - 20 mg/dL   Creatinine, Ser 2.15 (H) 0.44 - 1.00 mg/dL   Calcium 8.5 (L) 8.9 - 10.3 mg/dL   GFR calc non Af Amer 20 (L) >60 mL/min   GFR calc Af Amer 23 (L) >60 mL/min    Comment: (NOTE) The eGFR has been calculated using the CKD EPI equation. This calculation has not been validated in all clinical situations. eGFR's persistently <60 mL/min signify possible Chronic Kidney Disease.    Anion gap 10 5 - 15  Protime-INR     Status: Abnormal   Collection Time: 10/15/16  3:02 PM  Result Value Ref Range   Prothrombin Time 20.0 (H) 11.4 - 15.2 seconds   INR 1.72   CBC     Status: Abnormal   Collection Time: 10/15/16  3:02 PM  Result Value Ref Range   WBC 6.4 4.0 - 10.5 K/uL   RBC 3.07 (L) 3.87 - 5.11 MIL/uL   Hemoglobin 9.8 (L) 12.0 - 15.0 g/dL   HCT 30.8 (L) 36.0 - 46.0 %   MCV 100.3 (  H) 78.0 - 100.0 fL   MCH 31.9 26.0 - 34.0 pg   MCHC 31.8 30.0 - 36.0 g/dL   RDW 16.1 (H) 11.5 - 15.5 %   Platelets 105 (L) 150 - 400 K/uL    Comment:  SPECIMEN CHECKED FOR CLOTS CONSISTENT WITH PREVIOUS RESULT   Heparin level (unfractionated)     Status: Abnormal   Collection Time: 10/15/16  3:02 PM  Result Value Ref Range   Heparin Unfractionated 0.21 (L) 0.30 - 0.70 IU/mL    Comment:        IF HEPARIN RESULTS ARE BELOW EXPECTED VALUES, AND PATIENT DOSAGE HAS BEEN CONFIRMED, SUGGEST FOLLOW UP TESTING OF ANTITHROMBIN III LEVELS.   CBC     Status: Abnormal   Collection Time: 10/15/16  4:48 PM  Result Value Ref Range   WBC 6.4 4.0 - 10.5 K/uL   RBC 3.13 (L) 3.87 - 5.11 MIL/uL   Hemoglobin 9.9 (L) 12.0 - 15.0 g/dL   HCT 31.3 (L) 36.0 - 46.0 %   MCV 100.0 78.0 - 100.0 fL   MCH 31.6 26.0 - 34.0 pg   MCHC 31.6 30.0 - 36.0 g/dL   RDW 16.1 (H) 11.5 - 15.5 %   Platelets 124 (L) 150 - 400 K/uL  Type and screen [facility]     Status: None (Preliminary result)   Collection Time: 10/15/16  4:48 PM  Result Value Ref Range   ABO/RH(D) O POS    Antibody Screen NEG    Sample Expiration 10/18/2016    Unit Number [id number]    Blood Component Type RBC LR PHER2    Unit division 00    Status of Unit ALLOCATED    Transfusion Status OK TO TRANSFUSE    Crossmatch Result Compatible    Unit Number [id number]    Blood Component Type RED CELLS,LR    Unit division 00    Status of Unit ALLOCATED    Transfusion Status OK TO TRANSFUSE    Crossmatch Result Compatible   Prepare RBC     Status: None   Collection Time: 10/15/16  4:48 PM  Result Value Ref Range   Order Confirmation ORDER PROCESSED BY BLOOD BANK   ABO/Rh     Status: None   Collection Time: 10/15/16  4:48 PM  Result Value Ref Range   ABO/RH(D) O POS   Glucose, capillary     Status: Abnormal   Collection Time: 10/15/16  5:12 PM  Result Value Ref Range   Glucose-Capillary 141 (H) 65 - 99 mg/dL  Glucose, capillary     Status: Abnormal   Collection Time: 10/15/16  8:40 PM  Result Value Ref Range   Glucose-Capillary 192 (H) 65 - 99 mg/dL   Comment 1 Notify RN     Comment 2 Document in Chart   Basic metabolic panel     Status: Abnormal   Collection Time: 10/16/16  6:28 AM  Result Value Ref Range   Sodium 143 135 - 145 mmol/L   Potassium 3.9 3.5 - 5.1 mmol/L   Chloride 106 101 - 111 mmol/L   CO2 30 22 - 32 mmol/L   Glucose, Bld 161 (H) 65 - 99 mg/dL   BUN 76 (H) 6 - 20 mg/dL   Creatinine, Ser 2.14 (H) 0.44 - 1.00 mg/dL   Calcium 8.6 (L) 8.9 - 10.3 mg/dL   GFR calc non Af Amer 20 (L) >60 mL/min   GFR calc Af Amer 23 (L) >60 mL/min  Comment: (NOTE) The eGFR has been calculated using the CKD EPI equation. This calculation has not been validated in all clinical situations. eGFR's persistently <60 mL/min signify possible Chronic Kidney Disease.    Anion gap 7 5 - 15  Protime-INR     Status: Abnormal   Collection Time: 10/16/16  6:28 AM  Result Value Ref Range   Prothrombin Time 19.6 (H) 11.4 - 15.2 seconds   INR 1.67   CBC     Status: Abnormal   Collection Time: 10/16/16  6:28 AM  Result Value Ref Range   WBC 6.6 4.0 - 10.5 K/uL   RBC 3.06 (L) 3.87 - 5.11 MIL/uL   Hemoglobin 9.8 (L) 12.0 - 15.0 g/dL   HCT 30.9 (L) 36.0 - 46.0 %   MCV 101.0 (H) 78.0 - 100.0 fL   MCH 32.0 26.0 - 34.0 pg   MCHC 31.7 30.0 - 36.0 g/dL   RDW 16.3 (H) 11.5 - 15.5 %   Platelets 117 (L) 150 - 400 K/uL  Heparin level (unfractionated)     Status: Abnormal   Collection Time: 10/16/16  6:28 AM  Result Value Ref Range   Heparin Unfractionated <0.10 (L) 0.30 - 0.70 IU/mL    Comment:        IF HEPARIN RESULTS ARE BELOW EXPECTED VALUES, AND PATIENT DOSAGE HAS BEEN CONFIRMED, SUGGEST FOLLOW UP TESTING OF ANTITHROMBIN III LEVELS.   Glucose, capillary     Status: Abnormal   Collection Time: 10/16/16  7:35 AM  Result Value Ref Range   Glucose-Capillary 142 (H) 65 - 99 mg/dL    ABGS No results for input(s): PHART, PO2ART, TCO2, HCO3 in the last 72 hours.  Invalid input(s): PCO2 CULTURES No results found for this or any previous visit (from the past 240  hour(s)). Studies/Results: Dg Chest Port 1 View  Result Date: 10/15/2016 CLINICAL DATA:  PICC placement EXAM: PORTABLE CHEST 1 VIEW COMPARISON:  10/27/2016 FINDINGS: Right arm PICC tip in the proximal SVC. Cardiac enlargement. Vascular congestion and mild edema unchanged. Bibasilar airspace disease unchanged with small left effusion IMPRESSION: PICC tip in the proximal SVC. Continued heart failure with mild edema and bibasilar atelectasis. Electronically Signed   By: Franchot Gallo M.D.   On: 10/15/2016 13:38   Dg Chest Port 1 View  Result Date: 10/17/2016 CLINICAL DATA:  Diffuse body edema, weight gain and shortness of breath for several days. EXAM: PORTABLE CHEST 1 VIEW COMPARISON:  08/22/2016. FINDINGS: Poor inspiration. Stable enlarged cardiac silhouette. No significant change and prominence of the pulmonary vasculature and interstitial markings taking the decreased inspiration into account. New linear densities in both mid lung zones. Aortic arch calcifications. No acute bony abnormality. IMPRESSION: 1. Poor inspiration with interval mild linear atelectasis in both mid lung zones. 2. Stable cardiomegaly, pulmonary vascular congestion and mild chronic interstitial lung disease. Electronically Signed   By: Claudie Revering M.D.   On: 10/30/2016 13:48    Medications:  Prior to Admission:  Prescriptions Prior to Admission  Medication Sig Dispense Refill Last Dose  . acetaminophen (TYLENOL) 325 MG tablet Take 2 tablets (650 mg total) by mouth every 4 (four) hours as needed for mild pain, moderate pain, fever or headache.   unknown  . albuterol (PROVENTIL) (2.5 MG/3ML) 0.083% nebulizer solution Take 3 mLs (2.5 mg total) by nebulization every 6 (six) hours as needed for wheezing or shortness of breath. 75 mL 12 10/13/2016 at Unknown time  . amLODipine (NORVASC) 5 MG tablet Take 1 tablet (5  mg total) by mouth daily. 30 tablet 11 11/04/2016 at Unknown time  . atorvastatin (LIPITOR) 40 MG tablet Take 1 tablet (40  mg total) by mouth daily. 90 tablet 3 10/13/2016 at Unknown time  . bisacodyl (DULCOLAX) 10 MG suppository Place 1 suppository (10 mg total) rectally daily as needed for moderate constipation. 12 suppository 0   . bisacodyl (DULCOLAX) 5 MG EC tablet Take 1 tablet (5 mg total) by mouth daily as needed for moderate constipation. 30 tablet 0   . clopidogrel (PLAVIX) 75 MG tablet Take 1 tablet (75 mg total) by mouth daily. 90 tablet 3 10/13/2016 at 1600  . hydrALAZINE (APRESOLINE) 100 MG tablet Take 1 tablet (100 mg total) by mouth 3 (three) times daily. 270 tablet 3 10/15/2016 at Unknown time  . HYDROcodone-acetaminophen (NORCO/VICODIN) 5-325 MG tablet Take 1 tablet by mouth 4 (four) times daily as needed for moderate pain. 30 tablet 0 11/03/2016 at Unknown time  . insulin aspart (NOVOLOG) 100 UNIT/ML injection Inject 0-15 Units into the skin 3 (three) times daily with meals. 10 mL 11   . insulin aspart (NOVOLOG) 100 UNIT/ML injection Inject 0-5 Units into the skin at bedtime. 10 mL 11 10/13/2016 at Unknown time  . insulin aspart (NOVOLOG) 100 UNIT/ML injection Inject 4 Units into the skin 3 (three) times daily with meals. 10 mL 11 10/20/2016 at Unknown time  . isosorbide mononitrate (IMDUR) 60 MG 24 hr tablet Take 1 tablet (60 mg total) by mouth daily. 90 tablet 3 10/27/2016 at Unknown time  . nitroGLYCERIN (NITROSTAT) 0.4 MG SL tablet Place 1 tablet (0.4 mg total) under the tongue every 5 (five) minutes as needed for chest pain. 25 tablet 2 unknown  . pantoprazole (PROTONIX) 40 MG tablet Take 1 tablet (40 mg total) by mouth daily at 6 (six) AM. 90 tablet 3 11/05/2016 at Unknown time  . primidone (MYSOLINE) 50 MG tablet Take 1 tablet (50 mg total) by mouth daily.   10/28/2016 at Unknown time  . PROAIR HFA 108 (90 Base) MCG/ACT inhaler Inhale 1-2 puffs into the lungs 4 (four) times daily.    10/26/2016 at Unknown time  . torsemide (DEMADEX) 20 MG tablet Take 1 tablet (20 mg total) by mouth daily.   10/16/2016 at Unknown time   . TRAVATAN Z 0.004 % SOLN ophthalmic solution Place 1 drop into both eyes at bedtime.    10/13/2016 at Unknown time  . umeclidinium bromide (INCRUSE ELLIPTA) 62.5 MCG/INH AEPB Inhale 1 puff into the lungs daily.   10/13/2016 at Unknown time  . Vitamin D, Ergocalciferol, (DRISDOL) 50000 units CAPS capsule Take 1 capsule (50,000 Units total) by mouth every 7 (seven) days. 30 capsule  Past Week at Unknown time  . warfarin (COUMADIN) 5 MG tablet TAKE 1 TABLET BY MOUTH DAILY EXCEPT 1/2 TABLET ON WEDNESDAYS AND SATURDAYS. 30 tablet 6 10/13/2016 at 1700  . ALPRAZolam (XANAX) 0.25 MG tablet Take 0.25 mg by mouth 3 (three) times daily as needed.   Not Taking at Unknown time  . ferumoxytol 510 mg in sodium chloride 0.9 % 100 mL Inject 510 mg into the vein once a week. (Patient not taking: Reported on 10/29/2016)   Not Taking at Unknown time  . gabapentin (NEURONTIN) 100 MG capsule Take 100 mg by mouth at bedtime. Take 2 tabs at night   Not Taking at Unknown time   Scheduled: . albuterol  2.5 mg Nebulization Q6H WA  . amLODipine  5 mg Oral Daily  . atorvastatin  40 mg Oral q1800  . clopidogrel  75 mg Oral Daily  . furosemide  40 mg Intravenous Q12H  . gabapentin  100 mg Oral QHS  . hydrALAZINE  100 mg Oral TID  . insulin aspart  0-5 Units Subcutaneous QHS  . insulin aspart  0-9 Units Subcutaneous TID WC  . isosorbide mononitrate  60 mg Oral Daily  . latanoprost  1 drop Both Eyes QHS  . mouth rinse  15 mL Mouth Rinse BID  . pantoprazole  40 mg Oral Q0600  . primidone  50 mg Oral Daily  . sodium chloride flush  10-40 mL Intracatheter Q12H  . sodium chloride flush  3 mL Intravenous Q12H  . umeclidinium bromide  1 puff Inhalation Daily   Continuous: . sodium chloride    . sodium chloride Stopped (10/15/16 1600)   VOU:ZHQUIQ chloride, acetaminophen, ALPRAZolam, bisacodyl, hydrALAZINE, HYDROcodone-acetaminophen, nitroGLYCERIN, ondansetron (ZOFRAN) IV, sodium chloride flush, sodium chloride  flush  Assesment: She was admitted with acute on chronic diastolic heart failure. She has diuresed some but not a great deal. Her situation has been complicated by her chronic kidney disease stage IV. At her last hospitalization when we aggressively diuresed her her renal function was significantly worse. Echocardiogram showed significant right heart failure. She is known to have coronary disease at baseline but she's not having any chest pain. She has chronic atrial fib and has been chronically anticoagulated but her anticoagulation has been held because of her bleeding from the PICC line. She has poor IV access and now has a PICC line in place. She has hypertension which is stable. She has diabetes which is fairly well controlled here. Her renal dysfunction is stable at this point. She has thrombocytopenia which is actually a bit better today Principal Problem:   Acute on chronic diastolic CHF (congestive heart failure) (HCC) Active Problems:   Essential hypertension, benign   Persistent atrial fibrillation (HCC)   Chronic kidney disease (CKD), stage IV (severe) (HCC)   CAD S/P high risk PCI 12/01/15   Anemia of chronic disease   Diabetes mellitus type 2, controlled (HCC)   Thrombocytopenia ([place])    Plan: Increase diuresis. Watch renal function carefully. Continue to hold anticoagulants today and probably resume tomorrow presuming is no more bleeding. Will discuss with daughter tomorrow regarding palliative care consultation because I'm concerned that to adequately diurese her we'll probably put her into worsening renal failure approaching dialysis. Check CMET tomorrow to see about albumin and nutritional status as I suspected that is also contributing to her anasarca    LOS: 2 days   Naheem Mosco L 10/16/2016, 9:33 AM

## 2016-10-17 ENCOUNTER — Encounter (HOSPITAL_COMMUNITY): Payer: Self-pay | Admitting: Primary Care

## 2016-10-17 DIAGNOSIS — Z515 Encounter for palliative care: Secondary | ICD-10-CM

## 2016-10-17 DIAGNOSIS — Z7189 Other specified counseling: Secondary | ICD-10-CM

## 2016-10-17 LAB — HEPATIC FUNCTION PANEL
ALT: 17 U/L (ref 14–54)
AST: 31 U/L (ref 15–41)
Albumin: 3.1 g/dL — ABNORMAL LOW (ref 3.5–5.0)
Alkaline Phosphatase: 74 U/L (ref 38–126)
BILIRUBIN DIRECT: 0.3 mg/dL (ref 0.1–0.5)
BILIRUBIN TOTAL: 0.9 mg/dL (ref 0.3–1.2)
Indirect Bilirubin: 0.6 mg/dL (ref 0.3–0.9)
Total Protein: 6.4 g/dL — ABNORMAL LOW (ref 6.5–8.1)

## 2016-10-17 LAB — BASIC METABOLIC PANEL
Anion gap: 8 (ref 5–15)
BUN: 71 mg/dL — AB (ref 6–20)
CHLORIDE: 104 mmol/L (ref 101–111)
CO2: 30 mmol/L (ref 22–32)
Calcium: 8.9 mg/dL (ref 8.9–10.3)
Creatinine, Ser: 1.82 mg/dL — ABNORMAL HIGH (ref 0.44–1.00)
GFR calc Af Amer: 28 mL/min — ABNORMAL LOW (ref >60)
GFR calc non Af Amer: 25 mL/min — ABNORMAL LOW (ref >60)
GLUCOSE: 156 mg/dL — AB (ref 65–99)
POTASSIUM: 4.1 mmol/L (ref 3.5–5.1)
Sodium: 142 mmol/L (ref 135–145)

## 2016-10-17 LAB — GLUCOSE, CAPILLARY
GLUCOSE-CAPILLARY: 205 mg/dL — AB (ref 65–99)
Glucose-Capillary: 159 mg/dL — ABNORMAL HIGH (ref 65–99)
Glucose-Capillary: 168 mg/dL — ABNORMAL HIGH (ref 65–99)
Glucose-Capillary: 134 mg/dL — ABNORMAL HIGH (ref 65–99)

## 2016-10-17 MED ORDER — WARFARIN - PHARMACIST DOSING INPATIENT
Status: DC
  Administered 2016-10-20: 16:00:00

## 2016-10-17 MED ORDER — WARFARIN SODIUM 5 MG PO TABS
5.0000 mg | ORAL_TABLET | Freq: Once | ORAL | Status: AC
  Administered 2016-10-17: 5 mg via ORAL
  Filled 2016-10-17: qty 1

## 2016-10-17 NOTE — Consult Note (Signed)
Consultation Note Date: 10/17/2016   Patient Name: Melissa [name]  DOB: 1931/01/03  MRN: [id number]  Age / Sex: 83 y.o., female  PCP: Kari BaarsHawkins, Edward, MD Referring Physician: Kari BaarsHawkins, Edward, MD  Reason for Consultation: Establishing goals of care and Psychosocial/spiritual support  HPI/Patient Profile: 83 y.o. female  with past medical history of Atrial fibrillation, coronary artery disease, CHF, chronic kidney disease stage III, diabetes with neuropathy, high blood pressure and cholesterol, history of Klebsiella pneumonia, admitted on 10/13/2016 with acute on chronic heart failure stage IV, worsening CKD.   Clinical Assessment and Goals of Care: [name] is resting quietly in bed. She makes but does not keep eye contact. She is oriented to person place and time, but she seems confused at times. Nursing staff states she is oriented to self only at times. I question a diagnosis of dementia. There is no family of bedside at this time.  [name] states that she lives with her daughter [name]. She shares that she uses a walker, but fell about 2 weeks ago. I ask about contacting [name], and Mrs. Judson agrees, but is unable to tell me with her she works day or night shift, at first she states she is awake at 4 AM and then she states that she is awake 4 PM.  Call to daughter, [name]. We talk about Mrs. Kyllonen's heart disease including irregular heartbeat, coronary artery disease, and heart failure.  She states that her mother has been to rehab in the past, and would consider going to rehab again. She states that she had to bring her home because of payer source. [name] states that it's just her caring for her mother, her brother and sister have died. I share my concern over some confusion. [name] states that her mother has been confused since her last hospitalization, it did in clear up after she  returned home, but waxes and wanes. I share that I'm not surprised about the confusion with Mrs. Show's functional decline/hospitalizations/age of 83. I share that "things look different now".  We plan for a family meeting Wednesday 9/12 at 10 AM at bedside.  [facility] [place] [facility]     Healthcare power of attorney NEXT OF KIN - [name] is unable to tell me if she has a legal power of attorney, but she states she wants her daughter Elon JesterShirley Stines to help make health care decisions.   SUMMARY OF RECOMMENDATIONS   at this point, continue to treat the treatable but no extraordinary measures such as CPR or intubation.  Code Status/Advance Care Planning:  DNR  Symptom Management:   per Dr. [name], no additional needs at this time.  Palliative Prophylaxis:   Turn Reposition  Additional Recommendations (Limitations, Scope, Preferences):  Continue to treat the treatable, but no extraordinary measures such as CPR or intubation.  Psycho-social/Spiritual:   Desire for further Chaplaincy support:no  Additional Recommendations: Caregiving  Support/Resources  Prognosis:   < 6 months, based on 3 hospitalizations in the last 6 months, frailty, functional decline.  Patients decline of hemodialysis if needed.  Discharge Planning: To be determined, historically [name] has declined SNF/rehab.      Primary Diagnoses: Present on Admission: . Essential hypertension, benign . Persistent atrial fibrillation (HCC) . Acute on chronic diastolic CHF (congestive heart failure) (HCC) . Chronic kidney disease (CKD), stage IV (severe) (HCC) . Anemia of chronic disease . Thrombocytopenia (HCC)   I have reviewed the medical record, interviewed the patient and family, and examined the patient. The following aspects are pertinent.  Past Medical History:  Diagnosis Date  . Anemia   . Atherosclerotic heart disease   . Atrial fibrillation (HCC)   . CAD (coronary artery  disease)    Multivessel status post high risk PCI/DES to circumflex 11/2015 (poor candidate for CABG)  . CHF (congestive heart failure) (HCC)   . CKD (chronic kidney disease) stage 3, GFR 30-59 ml/min   . DDD (degenerative disc disease), lumbar   . Diabetic neuropathy (HCC)   . Essential hypertension, benign   . High cholesterol   . Klebsiella pneumonia (HCC)   . Mixed hyperlipidemia   . Nonrheumatic tricuspid (valve) insufficiency   . On home O2   . Type 2 diabetes mellitus (HCC)   . Unstable angina [facility])    Social History   Social History  . Marital status: Widowed    Spouse name: N/A  . Number of children: N/A  . Years of education: N/A   Occupational History  . Retired Retired   Social History Main Topics  . Smoking status: Never Smoker  . Smokeless tobacco: Never Used  . Alcohol use No  . Drug use: No  . Sexual activity: No   Other Topics Concern  . None   Social History Narrative  . None   Family History  Problem Relation Age of Onset  . Diabetes type II Mother   . Diabetes type II Father   . Hypertension Father    Scheduled Meds: . albuterol  2.5 mg Nebulization TID  . amLODipine  5 mg Oral Daily  . atorvastatin  40 mg Oral q1800  . clopidogrel  75 mg Oral Daily  . furosemide  80 mg Intravenous Q12H  . gabapentin  100 mg Oral QHS  . hydrALAZINE  100 mg Oral TID  . insulin aspart  0-5 Units Subcutaneous QHS  . insulin aspart  0-9 Units Subcutaneous TID WC  . isosorbide mononitrate  60 mg Oral Daily  . latanoprost  1 drop Both Eyes QHS  . mouth rinse  15 mL Mouth Rinse BID  . pantoprazole  40 mg Oral Q0600  . primidone  50 mg Oral Daily  . sodium chloride flush  10-40 mL Intracatheter Q12H  . sodium chloride flush  3 mL Intravenous Q12H  . umeclidinium bromide  1 puff Inhalation Daily  . warfarin  5 mg Oral Once  . Warfarin - Pharmacist Dosing Inpatient   Does not apply Q24H   Continuous Infusions: . sodium chloride    . sodium chloride Stopped  (10/15/16 1600)   PRN Meds:.sodium chloride, acetaminophen, albuterol, ALPRAZolam, bisacodyl, hydrALAZINE, HYDROcodone-acetaminophen, nitroGLYCERIN, ondansetron (ZOFRAN) IV, sodium chloride flush, sodium chloride flush Medications Prior to Admission:  Prior to Admission medications   Medication Sig Start Date End Date Taking? Authorizing Provider  acetaminophen (TYLENOL) 325 MG tablet Take 2 tablets (650 mg total) by mouth every 4 (four) hours as needed for mild pain, moderate pain, fever or headache. 12/05/15  Yes Kilroy, Eda Paschal, PA-C  albuterol (PROVENTIL) (  2.5 MG/3ML) 0.083% nebulizer solution Take 3 mLs (2.5 mg total) by nebulization every 6 (six) hours as needed for wheezing or shortness of breath. 09/02/16  Yes Kari Baars, MD  amLODipine (NORVASC) 5 MG tablet Take 1 tablet (5 mg total) by mouth daily. 09/02/16 09/02/17 Yes Kari Baars, MD  atorvastatin (LIPITOR) 40 MG tablet Take 1 tablet (40 mg total) by mouth daily. 12/06/15  Yes Kilroy, Luke K, PA-C  bisacodyl (DULCOLAX) 10 MG suppository Place 1 suppository (10 mg total) rectally daily as needed for moderate constipation. 09/02/16  Yes Kari Baars, MD  bisacodyl (DULCOLAX) 5 MG EC tablet Take 1 tablet (5 mg total) by mouth daily as needed for moderate constipation. 09/02/16  Yes Kari Baars, MD  clopidogrel (PLAVIX) 75 MG tablet Take 1 tablet (75 mg total) by mouth daily. 12/06/15  Yes Kilroy, Luke K, PA-C  hydrALAZINE (APRESOLINE) 100 MG tablet Take 1 tablet (100 mg total) by mouth 3 (three) times daily. 12/05/15  Yes Kilroy, Eda Paschal, PA-C  HYDROcodone-acetaminophen (NORCO/VICODIN) 5-325 MG tablet Take 1 tablet by mouth 4 (four) times daily as needed for moderate pain. 09/02/16  Yes Kari Baars, MD  insulin aspart (NOVOLOG) 100 UNIT/ML injection Inject 0-15 Units into the skin 3 (three) times daily with meals. 09/02/16  Yes Kari Baars, MD  insulin aspart (NOVOLOG) 100 UNIT/ML injection Inject 0-5 Units into the skin at  bedtime. 09/02/16  Yes Kari Baars, MD  insulin aspart (NOVOLOG) 100 UNIT/ML injection Inject 4 Units into the skin 3 (three) times daily with meals. 09/02/16  Yes Kari Baars, MD  isosorbide mononitrate (IMDUR) 60 MG 24 hr tablet Take 1 tablet (60 mg total) by mouth daily. 12/06/15  Yes Kilroy, Luke K, PA-C  nitroGLYCERIN (NITROSTAT) 0.4 MG SL tablet Place 1 tablet (0.4 mg total) under the tongue every 5 (five) minutes as needed for chest pain. 12/05/15  Yes Kilroy, Luke K, PA-C  pantoprazole (PROTONIX) 40 MG tablet Take 1 tablet (40 mg total) by mouth daily at 6 (six) AM. 12/06/15  Yes Kilroy, Eda Paschal, PA-C  primidone (MYSOLINE) 50 MG tablet Take 1 tablet (50 mg total) by mouth daily. 09/02/16  Yes Kari Baars, MD  PROAIR HFA 108 [phone] Base) MCG/ACT inhaler Inhale 1-2 puffs into the lungs 4 (four) times daily.  08/11/16  Yes [provider]  torsemide (DEMADEX) 20 MG tablet Take 1 tablet (20 mg total) by mouth daily. 09/02/16  Yes Kari Baars, MD  TRAVATAN Z 0.004 % SOLN ophthalmic solution Place 1 drop into both eyes at bedtime.  11/08/14  Yes [provider]  umeclidinium bromide (INCRUSE ELLIPTA) 62.5 MCG/INH AEPB Inhale 1 puff into the lungs daily.   Yes [provider]  Vitamin D, Ergocalciferol, (DRISDOL) 50000 units CAPS capsule Take 1 capsule (50,000 Units total) by mouth every 7 (seven) days. 09/07/16  Yes Kari Baars, MD  warfarin (COUMADIN) 5 MG tablet TAKE 1 TABLET BY MOUTH DAILY EXCEPT 1/2 TABLET ON [facility] AND SATURDAYS. 07/11/16  Yes Jonelle Sidle, MD  ALPRAZolam Prudy Feeler) 0.25 MG tablet Take 0.25 mg by mouth 3 (three) times daily as needed. 11/19/15   [provider]  ferumoxytol 510 mg in sodium chloride 0.9 % 100 mL Inject 510 mg into the vein once a week. Patient not taking: Reported on 11/06/2016 09/08/16   Kari Baars, MD  gabapentin (NEURONTIN) 100 MG capsule Take 100 mg by mouth at bedtime. Take 2 tabs at night    [provider]  No Known Allergies Review of Systems  Unable to perform ROS: Age    Physical Exam  Constitutional: No distress.  Appears frail, acutely/chronically ill. Makes but does not keep eye contact  HENT:  Head: Atraumatic.  Cardiovascular: Normal rate.   Pulmonary/Chest: Effort normal. No respiratory distress.  Abdominal: Soft.  Obese abdomen  Musculoskeletal: She exhibits edema.  Anasarca throughout, including upper arms and hands  Neurological: She is alert.  Oriented to person place and time at this time, but seems confused.   Skin: Skin is warm and dry.  Nursing note and vitals reviewed.   Vital Signs: BP (!) 148/113 (BP Location: Right Arm)   Pulse 83   Temp 98.7 F (37.1 C) (Oral)   Resp 20   Ht  (1.626 m)   Wt 110.7 kg (244 lb 1.6 oz)   SpO2 97%   BMI 41.90 kg/m  Pain Assessment: No/denies pain   Pain Score: Asleep   SpO2: SpO2: 97 % O2 Device:SpO2: 97 % O2 Flow Rate: .O2 Flow Rate (L/min): 3 L/min  IO: Intake/output summary:  Intake/Output Summary (Last 24 hours) at 10/17/16 1131 Last data filed at 10/17/16 0900  Gross per 24 hour  Intake              120 ml  Output             1600 ml  Net            -1480 ml    LBM: Last BM Date: 29-Oct-2016 Baseline Weight: Weight: 108.9 kg (240 lb) Most recent weight: Weight: 110.7 kg (244 lb 1.6 oz)     Palliative Assessment/Data:   Flowsheet Rows     Most Recent Value  Intake Tab  Referral Department  Hospitalist  Unit at Time of Referral  Cardiac/Telemetry Unit  Palliative Care Primary Diagnosis  Cardiac  Date Notified  10/17/16  Palliative Care Type  New Palliative care  Reason for referral  Clarify Goals of Care  Date of Admission  10-29-2016  Date first seen by Palliative Care  10/17/16  # of days Palliative referral response time  0 Day(s)  # of days IP prior to Palliative referral  3  Clinical Assessment  Palliative Performance Scale Score  40%  Pain Max last 24 hours  Not able to  report  Pain Min Last 24 hours  Not able to report  Dyspnea Max Last 24 Hours  Not able to report  Dyspnea Min Last 24 hours  Not able to report  Psychosocial & Spiritual Assessment  Palliative Care Outcomes  Patient/Family meeting held?  Yes  Who was at the meeting?  Patient at bedside, no family present at this time  Patient/Family wishes: Interventions discontinued/not started   Mechanical Ventilation      Time In: 1040 Time Out: 1120 Time Total: 40 minutes Greater than 50%  of this time was spent counseling and coordinating care related to the above assessment and plan.  Signed by: Katheran Awe, NP   Please contact Palliative Medicine Team phone at [phone] for questions and concerns.  For individual provider: See Loretha Stapler

## 2016-10-17 NOTE — Progress Notes (Signed)
Subjective: She says she feels okay. She is still short of breath. She's not having any other new complaints. She still has substantial swelling. I discussed with her whether she would want to start dialysis if necessary if we run into more problems with her renal function with diuresis which has been the case in the past and she says she would not want dialysis.  Objective: Vital signs in last 24 hours: Temp:  [98.2 F (36.8 C)-98.9 F (37.2 C)] 98.7 F (37.1 C) (09/10 0648) Pulse Rate:  [73-83] 83 (09/10 0648) Resp:  [20] 20 (09/10 0648) BP: (130-148)/(68-113) 148/113 (09/10 0648) SpO2:  [97 %-100 %] 97 % (09/10 0721) Weight:  [110.7 kg (244 lb 1.6 oz)] 110.7 kg (244 lb 1.6 oz) (09/10 0500) Weight change: 0.544 kg (1 lb 3.2 oz) Last BM Date: 10/16/2016  Intake/Output from previous day: 09/09 0701 - 09/10 0700 In: -  Out: 2200 [Urine:2200]  PHYSICAL EXAM General appearance: alert, cooperative and mild distress Resp: rales bilaterally Cardio: irregularly irregular rhythm GI: soft, non-tender; bowel sounds normal; no masses,  no organomegaly Extremities: She has diffuse edema in her arms legs abdomen and hips. Chronic venous stasis changes of the skin of her legs  Lab Results:  Results for orders placed or performed during the hospital encounter of 10/17/2016 (from the past 48 hour(s))  Glucose, capillary     Status: Abnormal   Collection Time: 10/15/16 11:20 AM  Result Value Ref Range   Glucose-Capillary 147 (H) 65 - 99 mg/dL   Comment 1 Notify RN    Comment 2 Document in Chart   Basic metabolic panel     Status: Abnormal   Collection Time: 10/15/16  3:02 PM  Result Value Ref Range   Sodium 144 135 - 145 mmol/L   Potassium 4.0 3.5 - 5.1 mmol/L   Chloride 105 101 - 111 mmol/L   CO2 29 22 - 32 mmol/L   Glucose, Bld 166 (H) 65 - 99 mg/dL   BUN 74 (H) 6 - 20 mg/dL   Creatinine, Ser 2.15 (H) 0.44 - 1.00 mg/dL   Calcium 8.5 (L) 8.9 - 10.3 mg/dL   GFR calc non Af Amer 20 (L)  >60 mL/min   GFR calc Af Amer 23 (L) >60 mL/min    Comment: (NOTE) The eGFR has been calculated using the CKD EPI equation. This calculation has not been validated in all clinical situations. eGFR's persistently <60 mL/min signify possible Chronic Kidney Disease.    Anion gap 10 5 - 15  Protime-INR     Status: Abnormal   Collection Time: 10/15/16  3:02 PM  Result Value Ref Range   Prothrombin Time 20.0 (H) 11.4 - 15.2 seconds   INR 1.72   CBC     Status: Abnormal   Collection Time: 10/15/16  3:02 PM  Result Value Ref Range   WBC 6.4 4.0 - 10.5 K/uL   RBC 3.07 (L) 3.87 - 5.11 MIL/uL   Hemoglobin 9.8 (L) 12.0 - 15.0 g/dL   HCT 30.8 (L) 36.0 - 46.0 %   MCV 100.3 (H) 78.0 - 100.0 fL   MCH 31.9 26.0 - 34.0 pg   MCHC 31.8 30.0 - 36.0 g/dL   RDW 16.1 (H) 11.5 - 15.5 %   Platelets 105 (L) 150 - 400 K/uL    Comment: SPECIMEN CHECKED FOR CLOTS CONSISTENT WITH PREVIOUS RESULT   Heparin level (unfractionated)     Status: Abnormal   Collection Time: 10/15/16  3:02 PM  Result Value Ref Range   Heparin Unfractionated 0.21 (L) 0.30 - 0.70 IU/mL    Comment:        IF HEPARIN RESULTS ARE BELOW EXPECTED VALUES, AND PATIENT DOSAGE HAS BEEN CONFIRMED, SUGGEST FOLLOW UP TESTING OF ANTITHROMBIN III LEVELS.   CBC     Status: Abnormal   Collection Time: 10/15/16  4:48 PM  Result Value Ref Range   WBC 6.4 4.0 - 10.5 K/uL   RBC 3.13 (L) 3.87 - 5.11 MIL/uL   Hemoglobin 9.9 (L) 12.0 - 15.0 g/dL   HCT 31.3 (L) 36.0 - 46.0 %   MCV 100.0 78.0 - 100.0 fL   MCH 31.6 26.0 - 34.0 pg   MCHC 31.6 30.0 - 36.0 g/dL   RDW 16.1 (H) 11.5 - 15.5 %   Platelets 124 (L) 150 - 400 K/uL  Type and screen [facility]     Status: None (Preliminary result)   Collection Time: 10/15/16  4:48 PM  Result Value Ref Range   ABO/RH(D) O POS    Antibody Screen NEG    Sample Expiration 10/18/2016    Unit Number [id number]    Blood Component Type RBC LR PHER2    Unit division 00    Status of Unit ALLOCATED     Transfusion Status OK TO TRANSFUSE    Crossmatch Result Compatible    Unit Number [id number]    Blood Component Type RED CELLS,LR    Unit division 00    Status of Unit ALLOCATED    Transfusion Status OK TO TRANSFUSE    Crossmatch Result Compatible   Prepare RBC     Status: None   Collection Time: 10/15/16  4:48 PM  Result Value Ref Range   Order Confirmation ORDER PROCESSED BY BLOOD BANK   ABO/Rh     Status: None   Collection Time: 10/15/16  4:48 PM  Result Value Ref Range   ABO/RH(D) O POS   Glucose, capillary     Status: Abnormal   Collection Time: 10/15/16  5:12 PM  Result Value Ref Range   Glucose-Capillary 141 (H) 65 - 99 mg/dL  Glucose, capillary     Status: Abnormal   Collection Time: 10/15/16  8:40 PM  Result Value Ref Range   Glucose-Capillary 192 (H) 65 - 99 mg/dL   Comment 1 Notify RN    Comment 2 Document in Chart   Basic metabolic panel     Status: Abnormal   Collection Time: 10/16/16  6:28 AM  Result Value Ref Range   Sodium 143 135 - 145 mmol/L   Potassium 3.9 3.5 - 5.1 mmol/L   Chloride 106 101 - 111 mmol/L   CO2 30 22 - 32 mmol/L   Glucose, Bld 161 (H) 65 - 99 mg/dL   BUN 76 (H) 6 - 20 mg/dL   Creatinine, Ser 2.14 (H) 0.44 - 1.00 mg/dL   Calcium 8.6 (L) 8.9 - 10.3 mg/dL   GFR calc non Af Amer 20 (L) >60 mL/min   GFR calc Af Amer 23 (L) >60 mL/min    Comment: (NOTE) The eGFR has been calculated using the CKD EPI equation. This calculation has not been validated in all clinical situations. eGFR's persistently <60 mL/min signify possible Chronic Kidney Disease.    Anion gap 7 5 - 15  Protime-INR     Status: Abnormal   Collection Time: 10/16/16  6:28 AM  Result Value Ref Range   Prothrombin Time 19.6 (H) 11.4 - 15.2 seconds  INR 1.67   CBC     Status: Abnormal   Collection Time: 10/16/16  6:28 AM  Result Value Ref Range   WBC 6.6 4.0 - 10.5 K/uL   RBC 3.06 (L) 3.87 - 5.11 MIL/uL   Hemoglobin 9.8 (L) 12.0 - 15.0 g/dL   HCT 30.9 (L) 36.0 -  46.0 %   MCV 101.0 (H) 78.0 - 100.0 fL   MCH 32.0 26.0 - 34.0 pg   MCHC 31.7 30.0 - 36.0 g/dL   RDW 16.3 (H) 11.5 - 15.5 %   Platelets 117 (L) 150 - 400 K/uL  Heparin level (unfractionated)     Status: Abnormal   Collection Time: 10/16/16  6:28 AM  Result Value Ref Range   Heparin Unfractionated <0.10 (L) 0.30 - 0.70 IU/mL    Comment:        IF HEPARIN RESULTS ARE BELOW EXPECTED VALUES, AND PATIENT DOSAGE HAS BEEN CONFIRMED, SUGGEST FOLLOW UP TESTING OF ANTITHROMBIN III LEVELS.   Glucose, capillary     Status: Abnormal   Collection Time: 10/16/16  7:35 AM  Result Value Ref Range   Glucose-Capillary 142 (H) 65 - 99 mg/dL  Glucose, capillary     Status: Abnormal   Collection Time: 10/16/16 11:28 AM  Result Value Ref Range   Glucose-Capillary 187 (H) 65 - 99 mg/dL   Comment 1 Notify RN    Comment 2 Document in Chart   Glucose, capillary     Status: Abnormal   Collection Time: 10/16/16  3:57 PM  Result Value Ref Range   Glucose-Capillary 167 (H) 65 - 99 mg/dL  Glucose, capillary     Status: Abnormal   Collection Time: 10/16/16  9:19 PM  Result Value Ref Range   Glucose-Capillary 174 (H) 65 - 99 mg/dL   Comment 1 Notify RN    Comment 2 Document in Chart   Basic metabolic panel     Status: Abnormal   Collection Time: 10/17/16  5:56 AM  Result Value Ref Range   Sodium 142 135 - 145 mmol/L   Potassium 4.1 3.5 - 5.1 mmol/L   Chloride 104 101 - 111 mmol/L   CO2 30 22 - 32 mmol/L   Glucose, Bld 156 (H) 65 - 99 mg/dL   BUN 71 (H) 6 - 20 mg/dL   Creatinine, Ser 1.82 (H) 0.44 - 1.00 mg/dL   Calcium 8.9 8.9 - 10.3 mg/dL   GFR calc non Af Amer 25 (L) >60 mL/min   GFR calc Af Amer 28 (L) >60 mL/min    Comment: (NOTE) The eGFR has been calculated using the CKD EPI equation. This calculation has not been validated in all clinical situations. eGFR's persistently <60 mL/min signify possible Chronic Kidney Disease.    Anion gap 8 5 - 15  Glucose, capillary     Status: Abnormal    Collection Time: 10/17/16  7:23 AM  Result Value Ref Range   Glucose-Capillary 159 (H) 65 - 99 mg/dL    ABGS No results for input(s): PHART, PO2ART, TCO2, HCO3 in the last 72 hours.  Invalid input(s): PCO2 CULTURES No results found for this or any previous visit (from the past 240 hour(s)). Studies/Results: Dg Chest Port 1 View  Result Date: 10/15/2016 CLINICAL DATA:  PICC placement EXAM: PORTABLE CHEST 1 VIEW COMPARISON:  10/13/2016 FINDINGS: Right arm PICC tip in the proximal SVC. Cardiac enlargement. Vascular congestion and mild edema unchanged. Bibasilar airspace disease unchanged with small left effusion IMPRESSION: PICC tip in the proximal  SVC. Continued heart failure with mild edema and bibasilar atelectasis. Electronically Signed   By: Franchot Gallo M.D.   On: 10/15/2016 13:38    Medications:  Prior to Admission:  Prescriptions Prior to Admission  Medication Sig Dispense Refill Last Dose  . acetaminophen (TYLENOL) 325 MG tablet Take 2 tablets (650 mg total) by mouth every 4 (four) hours as needed for mild pain, moderate pain, fever or headache.   unknown  . albuterol (PROVENTIL) (2.5 MG/3ML) 0.083% nebulizer solution Take 3 mLs (2.5 mg total) by nebulization every 6 (six) hours as needed for wheezing or shortness of breath. 75 mL 12 10/13/2016 at Unknown time  . amLODipine (NORVASC) 5 MG tablet Take 1 tablet (5 mg total) by mouth daily. 30 tablet 11 10/17/2016 at Unknown time  . atorvastatin (LIPITOR) 40 MG tablet Take 1 tablet (40 mg total) by mouth daily. 90 tablet 3 10/13/2016 at Unknown time  . bisacodyl (DULCOLAX) 10 MG suppository Place 1 suppository (10 mg total) rectally daily as needed for moderate constipation. 12 suppository 0   . bisacodyl (DULCOLAX) 5 MG EC tablet Take 1 tablet (5 mg total) by mouth daily as needed for moderate constipation. 30 tablet 0   . clopidogrel (PLAVIX) 75 MG tablet Take 1 tablet (75 mg total) by mouth daily. 90 tablet 3 10/13/2016 at 1600  .  hydrALAZINE (APRESOLINE) 100 MG tablet Take 1 tablet (100 mg total) by mouth 3 (three) times daily. 270 tablet 3 10/12/2016 at Unknown time  . HYDROcodone-acetaminophen (NORCO/VICODIN) 5-325 MG tablet Take 1 tablet by mouth 4 (four) times daily as needed for moderate pain. 30 tablet 0 10/26/2016 at Unknown time  . insulin aspart (NOVOLOG) 100 UNIT/ML injection Inject 0-15 Units into the skin 3 (three) times daily with meals. 10 mL 11   . insulin aspart (NOVOLOG) 100 UNIT/ML injection Inject 0-5 Units into the skin at bedtime. 10 mL 11 10/13/2016 at Unknown time  . insulin aspart (NOVOLOG) 100 UNIT/ML injection Inject 4 Units into the skin 3 (three) times daily with meals. 10 mL 11 10/12/2016 at Unknown time  . isosorbide mononitrate (IMDUR) 60 MG 24 hr tablet Take 1 tablet (60 mg total) by mouth daily. 90 tablet 3 10/08/2016 at Unknown time  . nitroGLYCERIN (NITROSTAT) 0.4 MG SL tablet Place 1 tablet (0.4 mg total) under the tongue every 5 (five) minutes as needed for chest pain. 25 tablet 2 unknown  . pantoprazole (PROTONIX) 40 MG tablet Take 1 tablet (40 mg total) by mouth daily at 6 (six) AM. 90 tablet 3 10/15/2016 at Unknown time  . primidone (MYSOLINE) 50 MG tablet Take 1 tablet (50 mg total) by mouth daily.   10/12/2016 at Unknown time  . PROAIR HFA 108 (90 Base) MCG/ACT inhaler Inhale 1-2 puffs into the lungs 4 (four) times daily.    11/02/2016 at Unknown time  . torsemide (DEMADEX) 20 MG tablet Take 1 tablet (20 mg total) by mouth daily.   10/12/2016 at Unknown time  . TRAVATAN Z 0.004 % SOLN ophthalmic solution Place 1 drop into both eyes at bedtime.    10/13/2016 at Unknown time  . umeclidinium bromide (INCRUSE ELLIPTA) 62.5 MCG/INH AEPB Inhale 1 puff into the lungs daily.   10/13/2016 at Unknown time  . Vitamin D, Ergocalciferol, (DRISDOL) 50000 units CAPS capsule Take 1 capsule (50,000 Units total) by mouth every 7 (seven) days. 30 capsule  Past Week at Unknown time  . warfarin (COUMADIN) 5 MG tablet TAKE 1  TABLET BY  MOUTH DAILY EXCEPT 1/2 TABLET ON WEDNESDAYS AND SATURDAYS. 30 tablet 6 10/13/2016 at 1700  . ALPRAZolam (XANAX) 0.25 MG tablet Take 0.25 mg by mouth 3 (three) times daily as needed.   Not Taking at Unknown time  . ferumoxytol 510 mg in sodium chloride 0.9 % 100 mL Inject 510 mg into the vein once a week. (Patient not taking: Reported on 10/30/2016)   Not Taking at Unknown time  . gabapentin (NEURONTIN) 100 MG capsule Take 100 mg by mouth at bedtime. Take 2 tabs at night   Not Taking at Unknown time   Scheduled: . albuterol  2.5 mg Nebulization TID  . amLODipine  5 mg Oral Daily  . atorvastatin  40 mg Oral q1800  . clopidogrel  75 mg Oral Daily  . furosemide  80 mg Intravenous Q12H  . gabapentin  100 mg Oral QHS  . hydrALAZINE  100 mg Oral TID  . insulin aspart  0-5 Units Subcutaneous QHS  . insulin aspart  0-9 Units Subcutaneous TID WC  . isosorbide mononitrate  60 mg Oral Daily  . latanoprost  1 drop Both Eyes QHS  . mouth rinse  15 mL Mouth Rinse BID  . pantoprazole  40 mg Oral Q0600  . primidone  50 mg Oral Daily  . sodium chloride flush  10-40 mL Intracatheter Q12H  . sodium chloride flush  3 mL Intravenous Q12H  . umeclidinium bromide  1 puff Inhalation Daily   Continuous: . sodium chloride    . sodium chloride Stopped (10/15/16 1600)   LEZ:VGJFTN chloride, acetaminophen, albuterol, ALPRAZolam, bisacodyl, hydrALAZINE, HYDROcodone-acetaminophen, nitroGLYCERIN, ondansetron (ZOFRAN) IV, sodium chloride flush, sodium chloride flush  Assesment: She was admitted with acute on chronic diastolic heart failure. She had poor IV access and a PICC line has been placed. She had bleeding from the area of the PICC line. She has multiple reasons for bleeding including she was on heparin because her prothrombin time was not therapeutic, she was on Coumadin, she had thrombocytopenia. Coumadin and heparin have been held but I think we can restart Coumadin at this point.  She has chronic  kidney disease but her kidney function has actually improved. In the past when we've diuresed her for acute on chronic heart failure she's developed increasing problems with renal failure but so far that has not happened on this admission. She does say that she would not want to be on dialysis. She has previously decided on no CODE BLUE status.  She has chronic atrial fib which is stable.  She has hypertension which is pretty well controlled.  She has diabetes which is pretty well controlled.  She has anemia of chronic disease. This is stable   Principal Problem:   Acute on chronic diastolic CHF (congestive heart failure) (HCC) Active Problems:   Essential hypertension, benign   Persistent atrial fibrillation (HCC)   Chronic kidney disease (CKD), stage IV (severe) (HCC)   CAD S/P high risk PCI 12/01/15   Anemia of chronic disease   Diabetes mellitus type 2, controlled ([place])   Thrombocytopenia ([place])    Plan: Continue treatments. Restart Coumadin. Request palliative care consultation for goal setting. Will discuss with her daughter by telephone. She has hepatic function profile pending because I wanted to see what her albumin level is to see if she's having a significant nutritional aspect of her swelling also. I suspect that is the case.    LOS: 3 days   Dorsey Authement L 10/17/2016, 7:59 AM

## 2016-10-17 NOTE — Progress Notes (Signed)
ANTICOAGULATION CONSULT NOTE - follow up  Pharmacy Consult for Coumadin Indication: atrial fibrillation  No Known Allergies  Patient Measurements: Height: 5\' 4"  (162.6 cm) Weight: 244 lb 1.6 oz (110.7 kg) IBW/kg (Calculated) : 54.7 HEPARIN DW (KG): 81  Vital Signs: Temp: 98.7 F (37.1 C) (09/10 0648) Temp Source: Oral (09/10 0648) BP: 148/113 (09/10 0648) Pulse Rate: 83 (09/10 0648)  Labs:  Recent Labs  11/04/2016 1328 10/15/16 1502 10/15/16 1648 10/16/16 0628 10/17/16 0556  HGB 10.6* 9.8* 9.9* 9.8*  --   HCT 33.2* 30.8* 31.3* 30.9*  --   PLT 127* 105* 124* 117*  --   LABPROT 19.3* 20.0*  --  19.6*  --   INR 1.64 1.72  --  1.67  --   HEPARINUNFRC  --  0.21*  --  <0.10*  --   CREATININE 2.22* 2.15*  --  2.14* 1.82*  TROPONINI 0.05*  --   --   --   --    Estimated Creatinine Clearance: 28.5 mL/min (A) (by C-G formula based on SCr of 1.82 mg/dL (H)).  Medical History: Past Medical History:  Diagnosis Date  . Anemia   . Atherosclerotic heart disease   . Atrial fibrillation (HCC)   . CAD (coronary artery disease)    Multivessel status post high risk PCI/DES to circumflex 11/2015 (poor candidate for CABG)  . CHF (congestive heart failure) (HCC)   . CKD (chronic kidney disease) stage 3, GFR 30-59 ml/min   . DDD (degenerative disc disease), lumbar   . Diabetic neuropathy (HCC)   . Essential hypertension, benign   . High cholesterol   . Klebsiella pneumonia (HCC)   . Mixed hyperlipidemia   . Nonrheumatic tricuspid (valve) insufficiency   . On home O2   . Type 2 diabetes mellitus (HCC)   . Unstable angina (HCC)    Medications:  Prescriptions Prior to Admission  Medication Sig Dispense Refill Last Dose  . acetaminophen (TYLENOL) 325 MG tablet Take 2 tablets (650 mg total) by mouth every 4 (four) hours as needed for mild pain, moderate pain, fever or headache.   unknown  . albuterol (PROVENTIL) (2.5 MG/3ML) 0.083% nebulizer solution Take 3 mLs (2.5 mg total) by  nebulization every 6 (six) hours as needed for wheezing or shortness of breath. 75 mL 12 10/13/2016 at Unknown time  . amLODipine (NORVASC) 5 MG tablet Take 1 tablet (5 mg total) by mouth daily. 30 tablet 11 11/01/2016 at Unknown time  . atorvastatin (LIPITOR) 40 MG tablet Take 1 tablet (40 mg total) by mouth daily. 90 tablet 3 10/13/2016 at Unknown time  . bisacodyl (DULCOLAX) 10 MG suppository Place 1 suppository (10 mg total) rectally daily as needed for moderate constipation. 12 suppository 0   . bisacodyl (DULCOLAX) 5 MG EC tablet Take 1 tablet (5 mg total) by mouth daily as needed for moderate constipation. 30 tablet 0   . clopidogrel (PLAVIX) 75 MG tablet Take 1 tablet (75 mg total) by mouth daily. 90 tablet 3 10/13/2016 at 1600  . hydrALAZINE (APRESOLINE) 100 MG tablet Take 1 tablet (100 mg total) by mouth 3 (three) times daily. 270 tablet 3 10/11/2016 at Unknown time  . HYDROcodone-acetaminophen (NORCO/VICODIN) 5-325 MG tablet Take 1 tablet by mouth 4 (four) times daily as needed for moderate pain. 30 tablet 0 11/02/2016 at Unknown time  . insulin aspart (NOVOLOG) 100 UNIT/ML injection Inject 0-15 Units into the skin 3 (three) times daily with meals. 10 mL 11   . insulin aspart (NOVOLOG)  100 UNIT/ML injection Inject 0-5 Units into the skin at bedtime. 10 mL 11 10/13/2016 at Unknown time  . insulin aspart (NOVOLOG) 100 UNIT/ML injection Inject 4 Units into the skin 3 (three) times daily with meals. 10 mL 11 10/25/2016 at Unknown time  . isosorbide mononitrate (IMDUR) 60 MG 24 hr tablet Take 1 tablet (60 mg total) by mouth daily. 90 tablet 3 10/19/2016 at Unknown time  . nitroGLYCERIN (NITROSTAT) 0.4 MG SL tablet Place 1 tablet (0.4 mg total) under the tongue every 5 (five) minutes as needed for chest pain. 25 tablet 2 unknown  . pantoprazole (PROTONIX) 40 MG tablet Take 1 tablet (40 mg total) by mouth daily at 6 (six) AM. 90 tablet 3 11/01/2016 at Unknown time  . primidone (MYSOLINE) 50 MG tablet Take 1 tablet (50  mg total) by mouth daily.   10/30/2016 at Unknown time  . PROAIR HFA 108 (90 Base) MCG/ACT inhaler Inhale 1-2 puffs into the lungs 4 (four) times daily.    11/06/2016 at Unknown time  . torsemide (DEMADEX) 20 MG tablet Take 1 tablet (20 mg total) by mouth daily.   10/30/2016 at Unknown time  . TRAVATAN Z 0.004 % SOLN ophthalmic solution Place 1 drop into both eyes at bedtime.    10/13/2016 at Unknown time  . umeclidinium bromide (INCRUSE ELLIPTA) 62.5 MCG/INH AEPB Inhale 1 puff into the lungs daily.   10/13/2016 at Unknown time  . Vitamin D, Ergocalciferol, (DRISDOL) 50000 units CAPS capsule Take 1 capsule (50,000 Units total) by mouth every 7 (seven) days. 30 capsule  Past Week at Unknown time  . warfarin (COUMADIN) 5 MG tablet TAKE 1 TABLET BY MOUTH DAILY EXCEPT 1/2 TABLET ON WEDNESDAYS AND SATURDAYS. 30 tablet 6 10/13/2016 at 1700  . ALPRAZolam (XANAX) 0.25 MG tablet Take 0.25 mg by mouth 3 (three) times daily as needed.   Not Taking at Unknown time  . ferumoxytol 510 mg in sodium chloride 0.9 % 100 mL Inject 510 mg into the vein once a week. (Patient not taking: Reported on 10/30/2016)   Not Taking at Unknown time  . gabapentin (NEURONTIN) 100 MG capsule Take 100 mg by mouth at bedtime. Take 2 tabs at night   Not Taking at Unknown time   Assessment: 83yo female on chronic Coumadin PTA for h/o afib.  INR is subtherapeutic on admission.   Heparin and Coumadin were held due to bleeding and problems with IV access, blood draws.  Now asked to resume Warfarin.   Goal of Therapy:  INR 2-3 Monitor platelets by anticoagulation protocol: Yes   Plan:  Coumadin  today x 1 CBC, INR daily Monitor for s/sx bleeding complications  Valrie Hart A 10/17/2016,8:08 AM

## 2016-10-18 LAB — CBC
HEMATOCRIT: 30.6 % — AB (ref 36.0–46.0)
Hemoglobin: 9.8 g/dL — ABNORMAL LOW (ref 12.0–15.0)
MCH: 32.2 pg (ref 26.0–34.0)
MCHC: 32 g/dL (ref 30.0–36.0)
MCV: 100.7 fL — ABNORMAL HIGH (ref 78.0–100.0)
PLATELETS: 114 10*3/uL — AB (ref 150–400)
RBC: 3.04 MIL/uL — ABNORMAL LOW (ref 3.87–5.11)
RDW: 16.2 % — AB (ref 11.5–15.5)
WBC: 7.2 10*3/uL (ref 4.0–10.5)

## 2016-10-18 LAB — GLUCOSE, CAPILLARY
GLUCOSE-CAPILLARY: 79 mg/dL (ref 65–99)
GLUCOSE-CAPILLARY: 137 mg/dL — AB (ref 65–99)
GLUCOSE-CAPILLARY: 148 mg/dL — AB (ref 65–99)
Glucose-Capillary: 171 mg/dL — ABNORMAL HIGH (ref 65–99)

## 2016-10-18 LAB — BASIC METABOLIC PANEL
ANION GAP: 8 (ref 5–15)
BUN: 68 mg/dL — ABNORMAL HIGH (ref 6–20)
CALCIUM: 9 mg/dL (ref 8.9–10.3)
CO2: 30 mmol/L (ref 22–32)
CREATININE: 1.75 mg/dL — AB (ref 0.44–1.00)
Chloride: 104 mmol/L (ref 101–111)
GFR, EST AFRICAN AMERICAN: 30 mL/min — AB (ref >60)
GFR, EST NON AFRICAN AMERICAN: 26 mL/min — AB (ref >60)
Glucose, Bld: 80 mg/dL (ref 65–99)
Potassium: 4.1 mmol/L (ref 3.5–5.1)
Sodium: 142 mmol/L (ref 135–145)

## 2016-10-18 LAB — PROTIME-INR
INR: 1.62
PROTHROMBIN TIME: 19.1 s — AB (ref 11.4–15.2)

## 2016-10-18 MED ORDER — WARFARIN SODIUM 5 MG PO TABS
5.0000 mg | ORAL_TABLET | Freq: Once | ORAL | Status: AC
  Administered 2016-10-18: 5 mg via ORAL
  Filled 2016-10-18: qty 1

## 2016-10-18 NOTE — Progress Notes (Signed)
Subjective: She says she feels okay except she can't raise her arms as well as she would like. No chest pain. Her shortness of breath is better. She is now down about 3-1/2 L. Her renal function has held. She had initial palliative care evaluation yesterday with family conference planned for tomorrow. She says she would not want to be resuscitated and she doesn't want dialysis so we need to move more towards comfort care considering her heart failure renal failure and other multiple medical problems. We will still treat the treatable but not escalate therapy  Objective: Vital signs in last 24 hours: Temp:  [97.6 F (36.4 C)-98.5 F (36.9 C)] 97.7 F (36.5 C) (09/11 0450) Pulse Rate:  [71-99] 81 (09/11 0450) Resp:  [18] 18 (09/11 0450) BP: (146-165)/(64-75) 165/75 (09/11 0450) SpO2:  [95 %-100 %] 99 % (09/11 0719) Weight:  [109.9 kg (242 lb 3.2 oz)] 109.9 kg (242 lb 3.2 oz) (09/11 0500) Weight change: -0.862 kg (-1 lb 14.4 oz) Last BM Date: 10/17/16  Intake/Output from previous day: 09/10 0701 - 09/11 0700 In: 240 [P.O.:240] Out: 900 [Urine:900]  PHYSICAL EXAM General appearance: alert, cooperative and no distress Resp: rales bilaterally Cardio: irregularly irregular rhythm GI: soft, non-tender; bowel sounds normal; no masses,  no organomegaly Extremities: Continued diffuse edema of her arms legs and buttocks and abdomen She is mildly confused. She is very hard of hearing  Lab Results:  Results for orders placed or performed during the hospital encounter of 11/06/2016 (from the past 48 hour(s))  Glucose, capillary     Status: Abnormal   Collection Time: 10/16/16 11:28 AM  Result Value Ref Range   Glucose-Capillary 187 (H) 65 - 99 mg/dL   Comment 1 Notify RN    Comment 2 Document in Chart   Glucose, capillary     Status: Abnormal   Collection Time: 10/16/16  3:57 PM  Result Value Ref Range   Glucose-Capillary 167 (H) 65 - 99 mg/dL  Glucose, capillary     Status: Abnormal   Collection Time: 10/16/16  9:19 PM  Result Value Ref Range   Glucose-Capillary 174 (H) 65 - 99 mg/dL   Comment 1 Notify RN    Comment 2 Document in Chart   Basic metabolic panel     Status: Abnormal   Collection Time: 10/17/16  5:56 AM  Result Value Ref Range   Sodium 142 135 - 145 mmol/L   Potassium 4.1 3.5 - 5.1 mmol/L   Chloride 104 101 - 111 mmol/L   CO2 30 22 - 32 mmol/L   Glucose, Bld 156 (H) 65 - 99 mg/dL   BUN 71 (H) 6 - 20 mg/dL   Creatinine, Ser 1.82 (H) 0.44 - 1.00 mg/dL   Calcium 8.9 8.9 - 10.3 mg/dL   GFR calc non Af Amer 25 (L) >60 mL/min   GFR calc Af Amer 28 (L) >60 mL/min    Comment: (NOTE) The eGFR has been calculated using the CKD EPI equation. This calculation has not been validated in all clinical situations. eGFR's persistently <60 mL/min signify possible Chronic Kidney Disease.    Anion gap 8 5 - 15  Glucose, capillary     Status: Abnormal   Collection Time: 10/17/16  7:23 AM  Result Value Ref Range   Glucose-Capillary 159 (H) 65 - 99 mg/dL  Hepatic function panel     Status: Abnormal   Collection Time: 10/17/16 10:19 AM  Result Value Ref Range   Total Protein 6.4 (L) 6.5 -  8.1 g/dL   Albumin 3.1 (L) 3.5 - 5.0 g/dL   AST 31 15 - 41 U/L   ALT 17 14 - 54 U/L   Alkaline Phosphatase 74 38 - 126 U/L   Total Bilirubin 0.9 0.3 - 1.2 mg/dL   Bilirubin, Direct 0.3 0.1 - 0.5 mg/dL   Indirect Bilirubin 0.6 0.3 - 0.9 mg/dL  Glucose, capillary     Status: Abnormal   Collection Time: 10/17/16 11:23 AM  Result Value Ref Range   Glucose-Capillary 168 (H) 65 - 99 mg/dL  Glucose, capillary     Status: Abnormal   Collection Time: 10/17/16  4:24 PM  Result Value Ref Range   Glucose-Capillary 205 (H) 65 - 99 mg/dL  Glucose, capillary     Status: Abnormal   Collection Time: 10/17/16  7:57 PM  Result Value Ref Range   Glucose-Capillary 134 (H) 65 - 99 mg/dL   Comment 1 Notify RN    Comment 2 Document in Chart   Basic metabolic panel     Status: Abnormal    Collection Time: 10/18/16  5:53 AM  Result Value Ref Range   Sodium 142 135 - 145 mmol/L   Potassium 4.1 3.5 - 5.1 mmol/L   Chloride 104 101 - 111 mmol/L   CO2 30 22 - 32 mmol/L   Glucose, Bld 80 65 - 99 mg/dL   BUN 68 (H) 6 - 20 mg/dL   Creatinine, Ser 1.75 (H) 0.44 - 1.00 mg/dL   Calcium 9.0 8.9 - 10.3 mg/dL   GFR calc non Af Amer 26 (L) >60 mL/min   GFR calc Af Amer 30 (L) >60 mL/min    Comment: (NOTE) The eGFR has been calculated using the CKD EPI equation. This calculation has not been validated in all clinical situations. eGFR's persistently <60 mL/min signify possible Chronic Kidney Disease.    Anion gap 8 5 - 15  Protime-INR     Status: Abnormal   Collection Time: 10/18/16  5:53 AM  Result Value Ref Range   Prothrombin Time 19.1 (H) 11.4 - 15.2 seconds   INR 1.62   CBC     Status: Abnormal   Collection Time: 10/18/16  5:53 AM  Result Value Ref Range   WBC 7.2 4.0 - 10.5 K/uL   RBC 3.04 (L) 3.87 - 5.11 MIL/uL   Hemoglobin 9.8 (L) 12.0 - 15.0 g/dL   HCT 30.6 (L) 36.0 - 46.0 %   MCV 100.7 (H) 78.0 - 100.0 fL   MCH 32.2 26.0 - 34.0 pg   MCHC 32.0 30.0 - 36.0 g/dL   RDW 16.2 (H) 11.5 - 15.5 %   Platelets 114 (L) 150 - 400 K/uL    Comment: CONSISTENT WITH PREVIOUS RESULT  Glucose, capillary     Status: None   Collection Time: 10/18/16  7:40 AM  Result Value Ref Range   Glucose-Capillary 79 65 - 99 mg/dL    ABGS No results for input(s): PHART, PO2ART, TCO2, HCO3 in the last 72 hours.  Invalid input(s): PCO2 CULTURES No results found for this or any previous visit (from the past 240 hour(s)). Studies/Results: No results found.  Medications:  Prior to Admission:  Prescriptions Prior to Admission  Medication Sig Dispense Refill Last Dose  . acetaminophen (TYLENOL) 325 MG tablet Take 2 tablets (650 mg total) by mouth every 4 (four) hours as needed for mild pain, moderate pain, fever or headache.   unknown  . albuterol (PROVENTIL) (2.5 MG/3ML) 0.083% nebulizer  solution Take  3 mLs (2.5 mg total) by nebulization every 6 (six) hours as needed for wheezing or shortness of breath. 75 mL 12 10/13/2016 at Unknown time  . amLODipine (NORVASC) 5 MG tablet Take 1 tablet (5 mg total) by mouth daily. 30 tablet 11 10/08/2016 at Unknown time  . atorvastatin (LIPITOR) 40 MG tablet Take 1 tablet (40 mg total) by mouth daily. 90 tablet 3 10/13/2016 at Unknown time  . bisacodyl (DULCOLAX) 10 MG suppository Place 1 suppository (10 mg total) rectally daily as needed for moderate constipation. 12 suppository 0   . bisacodyl (DULCOLAX) 5 MG EC tablet Take 1 tablet (5 mg total) by mouth daily as needed for moderate constipation. 30 tablet 0   . clopidogrel (PLAVIX) 75 MG tablet Take 1 tablet (75 mg total) by mouth daily. 90 tablet 3 10/13/2016 at 1600  . hydrALAZINE (APRESOLINE) 100 MG tablet Take 1 tablet (100 mg total) by mouth 3 (three) times daily. 270 tablet 3 10/29/2016 at Unknown time  . HYDROcodone-acetaminophen (NORCO/VICODIN) 5-325 MG tablet Take 1 tablet by mouth 4 (four) times daily as needed for moderate pain. 30 tablet 0 10/16/2016 at Unknown time  . insulin aspart (NOVOLOG) 100 UNIT/ML injection Inject 0-15 Units into the skin 3 (three) times daily with meals. 10 mL 11   . insulin aspart (NOVOLOG) 100 UNIT/ML injection Inject 0-5 Units into the skin at bedtime. 10 mL 11 10/13/2016 at Unknown time  . insulin aspart (NOVOLOG) 100 UNIT/ML injection Inject 4 Units into the skin 3 (three) times daily with meals. 10 mL 11 10/17/2016 at Unknown time  . isosorbide mononitrate (IMDUR) 60 MG 24 hr tablet Take 1 tablet (60 mg total) by mouth daily. 90 tablet 3 10/08/2016 at Unknown time  . nitroGLYCERIN (NITROSTAT) 0.4 MG SL tablet Place 1 tablet (0.4 mg total) under the tongue every 5 (five) minutes as needed for chest pain. 25 tablet 2 unknown  . pantoprazole (PROTONIX) 40 MG tablet Take 1 tablet (40 mg total) by mouth daily at 6 (six) AM. 90 tablet 3 10/28/2016 at Unknown time  . primidone  (MYSOLINE) 50 MG tablet Take 1 tablet (50 mg total) by mouth daily.   11/03/2016 at Unknown time  . PROAIR HFA 108 (90 Base) MCG/ACT inhaler Inhale 1-2 puffs into the lungs 4 (four) times daily.    11/02/2016 at Unknown time  . torsemide (DEMADEX) 20 MG tablet Take 1 tablet (20 mg total) by mouth daily.   11/01/2016 at Unknown time  . TRAVATAN Z 0.004 % SOLN ophthalmic solution Place 1 drop into both eyes at bedtime.    10/13/2016 at Unknown time  . umeclidinium bromide (INCRUSE ELLIPTA) 62.5 MCG/INH AEPB Inhale 1 puff into the lungs daily.   10/13/2016 at Unknown time  . Vitamin D, Ergocalciferol, (DRISDOL) 50000 units CAPS capsule Take 1 capsule (50,000 Units total) by mouth every 7 (seven) days. 30 capsule  Past Week at Unknown time  . warfarin (COUMADIN) 5 MG tablet TAKE 1 TABLET BY MOUTH DAILY EXCEPT 1/2 TABLET ON WEDNESDAYS AND SATURDAYS. 30 tablet 6 10/13/2016 at 1700  . ALPRAZolam (XANAX) 0.25 MG tablet Take 0.25 mg by mouth 3 (three) times daily as needed.   Not Taking at Unknown time  . ferumoxytol 510 mg in sodium chloride 0.9 % 100 mL Inject 510 mg into the vein once a week. (Patient not taking: Reported on 10/19/2016)   Not Taking at Unknown time  . gabapentin (NEURONTIN) 100 MG capsule Take 100 mg by mouth at  bedtime. Take 2 tabs at night   Not Taking at Unknown time   Scheduled: . albuterol  2.5 mg Nebulization TID  . amLODipine  5 mg Oral Daily  . atorvastatin  40 mg Oral q1800  . clopidogrel  75 mg Oral Daily  . furosemide  80 mg Intravenous Q12H  . gabapentin  100 mg Oral QHS  . hydrALAZINE  100 mg Oral TID  . insulin aspart  0-5 Units Subcutaneous QHS  . insulin aspart  0-9 Units Subcutaneous TID WC  . isosorbide mononitrate  60 mg Oral Daily  . latanoprost  1 drop Both Eyes QHS  . mouth rinse  15 mL Mouth Rinse BID  . pantoprazole  40 mg Oral Q0600  . primidone  50 mg Oral Daily  . sodium chloride flush  10-40 mL Intracatheter Q12H  . sodium chloride flush  3 mL Intravenous Q12H  .  umeclidinium bromide  1 puff Inhalation Daily  . Warfarin - Pharmacist Dosing Inpatient   Does not apply Q24H   Continuous: . sodium chloride    . sodium chloride Stopped (10/15/16 1600)   NAT:FTDDUK chloride, acetaminophen, albuterol, ALPRAZolam, bisacodyl, hydrALAZINE, HYDROcodone-acetaminophen, nitroGLYCERIN, ondansetron (ZOFRAN) IV, sodium chloride flush, sodium chloride flush  Assesment: She was admitted with acute on chronic diastolic heart failure. She has chronic kidney disease. She has diuresed and thankfully her renal function has held despite diuresis. In the past she's had significant difficulty with worsening renal function with diuresis. She has hypertension which is well controlled. She has persistent atrial fib and she came off anticoagulation because of bleeding when a PICC line was placed. She has diabetes which is well controlled. Principal Problem:   Acute on chronic diastolic CHF (congestive heart failure) (HCC) Active Problems:   Essential hypertension, benign   Persistent atrial fibrillation (HCC)   Chronic kidney disease (CKD), stage IV (severe) (HCC)   CAD S/P high risk PCI 12/01/15   Anemia of chronic disease   Diabetes mellitus type 2, controlled ([place])   Thrombocytopenia ([place])   Palliative care encounter   Goals of care, counseling/discussion    Plan: Continue diuresis.    LOS: 4 days   Ransome Helwig L 10/18/2016, 8:31 AM

## 2016-10-18 NOTE — Progress Notes (Signed)
ANTICOAGULATION CONSULT NOTE - follow up  Pharmacy Consult for Coumadin Indication: atrial fibrillation  No Known Allergies  Patient Measurements: Height:  (162.6 cm) Weight: 242 lb 3.2 oz (109.9 kg) IBW/kg (Calculated) : 54.7 HEPARIN DW (KG): 81  Vital Signs: Temp: 97.7 F (36.5 C) (09/11 0450) Temp Source: Oral (09/11 0450) BP: 165/75 (09/11 0450) Pulse Rate: 81 (09/11 0450)  Labs:  Recent Labs  10/15/16 1502 10/15/16 1648 10/16/16 0628 10/17/16 0556 10/18/16 0553  HGB 9.8* 9.9* 9.8*  --  9.8*  HCT 30.8* 31.3* 30.9*  --  30.6*  PLT 105* 124* 117*  --  114*  LABPROT 20.0*  --  19.6*  --  19.1*  INR 1.72  --  1.67  --  1.62  HEPARINUNFRC 0.21*  --  <0.10*  --   --   CREATININE 2.15*  --  2.14* 1.82* 1.75*   Estimated Creatinine Clearance: 29.5 mL/min (A) (by C-G formula based on SCr of 1.75 mg/dL (H)).  Medical History: Past Medical History:  Diagnosis Date  . Anemia   . Atherosclerotic heart disease   . Atrial fibrillation (HCC)   . CAD (coronary artery disease)    Multivessel status post high risk PCI/DES to circumflex 11/2015 (poor candidate for CABG)  . CHF (congestive heart failure) (HCC)   . CKD (chronic kidney disease) stage 3, GFR 30-59 ml/min   . DDD (degenerative disc disease), lumbar   . Diabetic neuropathy (HCC)   . Essential hypertension, benign   . High cholesterol   . Klebsiella pneumonia (HCC)   . Mixed hyperlipidemia   . Nonrheumatic tricuspid (valve) insufficiency   . On home O2   . Type 2 diabetes mellitus (HCC)   . Unstable angina (HCC)    Medications:  Prescriptions Prior to Admission  Medication Sig Dispense Refill Last Dose  . acetaminophen (TYLENOL) 325 MG tablet Take 2 tablets (650 mg total) by mouth every 4 (four) hours as needed for mild pain, moderate pain, fever or headache.   unknown  . albuterol (PROVENTIL) (2.5 MG/3ML) 0.083% nebulizer solution Take 3 mLs (2.5 mg total) by nebulization every 6 (six) hours as needed  for wheezing or shortness of breath. 75 mL 12 10/13/2016 at Unknown time  . amLODipine (NORVASC) 5 MG tablet Take 1 tablet (5 mg total) by mouth daily. 30 tablet 11 23-Oct-2016 at Unknown time  . atorvastatin (LIPITOR) 40 MG tablet Take 1 tablet (40 mg total) by mouth daily. 90 tablet 3 10/13/2016 at Unknown time  . bisacodyl (DULCOLAX) 10 MG suppository Place 1 suppository (10 mg total) rectally daily as needed for moderate constipation. 12 suppository 0   . bisacodyl (DULCOLAX) 5 MG EC tablet Take 1 tablet (5 mg total) by mouth daily as needed for moderate constipation. 30 tablet 0   . clopidogrel (PLAVIX) 75 MG tablet Take 1 tablet (75 mg total) by mouth daily. 90 tablet 3 10/13/2016 at 1600  . hydrALAZINE (APRESOLINE) 100 MG tablet Take 1 tablet (100 mg total) by mouth 3 (three) times daily. 270 tablet 3 Oct 23, 2016 at Unknown time  . HYDROcodone-acetaminophen (NORCO/VICODIN) 5-325 MG tablet Take 1 tablet by mouth 4 (four) times daily as needed for moderate pain. 30 tablet 0 11/04/2016 at Unknown time  . insulin aspart (NOVOLOG) 100 UNIT/ML injection Inject 0-15 Units into the skin 3 (three) times daily with meals. 10 mL 11   . insulin aspart (NOVOLOG) 100 UNIT/ML injection Inject 0-5 Units into the skin at bedtime. 10 mL 11 10/13/2016  at Unknown time  . insulin aspart (NOVOLOG) 100 UNIT/ML injection Inject 4 Units into the skin 3 (three) times daily with meals. 10 mL 11 11/03/2016 at Unknown time  . isosorbide mononitrate (IMDUR) 60 MG 24 hr tablet Take 1 tablet (60 mg total) by mouth daily. 90 tablet 3 10/15/2016 at Unknown time  . nitroGLYCERIN (NITROSTAT) 0.4 MG SL tablet Place 1 tablet (0.4 mg total) under the tongue every 5 (five) minutes as needed for chest pain. 25 tablet 2 unknown  . pantoprazole (PROTONIX) 40 MG tablet Take 1 tablet (40 mg total) by mouth daily at 6 (six) AM. 90 tablet 3 11/01/2016 at Unknown time  . primidone (MYSOLINE) 50 MG tablet Take 1 tablet (50 mg total) by mouth daily.   10/20/2016 at  Unknown time  . PROAIR HFA 108 (90 Base) MCG/ACT inhaler Inhale 1-2 puffs into the lungs 4 (four) times daily.    11/06/2016 at Unknown time  . torsemide (DEMADEX) 20 MG tablet Take 1 tablet (20 mg total) by mouth daily.   10/19/2016 at Unknown time  . TRAVATAN Z 0.004 % SOLN ophthalmic solution Place 1 drop into both eyes at bedtime.    10/13/2016 at Unknown time  . umeclidinium bromide (INCRUSE ELLIPTA) 62.5 MCG/INH AEPB Inhale 1 puff into the lungs daily.   10/13/2016 at Unknown time  . Vitamin D, Ergocalciferol, (DRISDOL) 50000 units CAPS capsule Take 1 capsule (50,000 Units total) by mouth every 7 (seven) days. 30 capsule  Past Week at Unknown time  . warfarin (COUMADIN) 5 MG tablet TAKE 1 TABLET BY MOUTH DAILY EXCEPT 1/2 TABLET ON WEDNESDAYS AND SATURDAYS. 30 tablet 6 10/13/2016 at 1700  . ALPRAZolam (XANAX) 0.25 MG tablet Take 0.25 mg by mouth 3 (three) times daily as needed.   Not Taking at Unknown time  . ferumoxytol 510 mg in sodium chloride 0.9 % 100 mL Inject 510 mg into the vein once a week. (Patient not taking: Reported on 10/16/2016)   Not Taking at Unknown time  . gabapentin (NEURONTIN) 100 MG capsule Take 100 mg by mouth at bedtime. Take 2 tabs at night   Not Taking at Unknown time   Assessment: 83yo female on chronic Coumadin PTA for h/o afib.  INR is subtherapeutic on admission.   Heparin and Coumadin were held due to bleeding and problems with IV access, blood draws.  Resumed Warfarin on 9/10.  No bleeding noted.  Goal of Therapy:  INR 2-3 Monitor platelets by anticoagulation protocol: Yes   Plan:  Repeat Coumadin 5mg  today x 1 CBC, INR daily Monitor for s/sx bleeding complications  [name], Keaira Whitehurst R 10/18/2016,1:09 PM

## 2016-10-19 LAB — CBC
HCT: 31.1 % — ABNORMAL LOW (ref 36.0–46.0)
Hemoglobin: 10 g/dL — ABNORMAL LOW (ref 12.0–15.0)
MCH: 32.4 pg (ref 26.0–34.0)
MCHC: 32.2 g/dL (ref 30.0–36.0)
MCV: 100.6 fL — ABNORMAL HIGH (ref 78.0–100.0)
PLATELETS: 108 10*3/uL — AB (ref 150–400)
RBC: 3.09 MIL/uL — AB (ref 3.87–5.11)
RDW: 16.1 % — AB (ref 11.5–15.5)
WBC: 7.8 10*3/uL (ref 4.0–10.5)

## 2016-10-19 LAB — GLUCOSE, CAPILLARY
GLUCOSE-CAPILLARY: 123 mg/dL — AB (ref 65–99)
Glucose-Capillary: 130 mg/dL — ABNORMAL HIGH (ref 65–99)
Glucose-Capillary: 145 mg/dL — ABNORMAL HIGH (ref 65–99)
Glucose-Capillary: 180 mg/dL — ABNORMAL HIGH (ref 65–99)

## 2016-10-19 LAB — PROTIME-INR
INR: 1.76
Prothrombin Time: 20.4 seconds — ABNORMAL HIGH (ref 11.4–15.2)

## 2016-10-19 LAB — BASIC METABOLIC PANEL
Anion gap: 9 (ref 5–15)
BUN: 68 mg/dL — ABNORMAL HIGH (ref 6–20)
CO2: 30 mmol/L (ref 22–32)
Calcium: 9 mg/dL (ref 8.9–10.3)
Chloride: 102 mmol/L (ref 101–111)
Creatinine, Ser: 1.73 mg/dL — ABNORMAL HIGH (ref 0.44–1.00)
GFR calc Af Amer: 30 mL/min — ABNORMAL LOW (ref >60)
GFR calc non Af Amer: 26 mL/min — ABNORMAL LOW (ref >60)
Glucose, Bld: 136 mg/dL — ABNORMAL HIGH (ref 65–99)
Potassium: 4.4 mmol/L (ref 3.5–5.1)
Sodium: 141 mmol/L (ref 135–145)

## 2016-10-19 LAB — TYPE AND SCREEN
ABO/RH(D): O POS
Antibody Screen: NEGATIVE
UNIT DIVISION: 0
Unit division: 0

## 2016-10-19 MED ORDER — WARFARIN SODIUM 5 MG PO TABS
5.0000 mg | ORAL_TABLET | Freq: Once | ORAL | Status: AC
  Administered 2016-10-19: 5 mg via ORAL
  Filled 2016-10-19: qty 1

## 2016-10-19 MED ORDER — FUROSEMIDE 10 MG/ML IJ SOLN
80.0000 mg | Freq: Three times a day (TID) | INTRAMUSCULAR | Status: DC
  Administered 2016-10-19 – 2016-10-20 (×3): 80 mg via INTRAVENOUS
  Filled 2016-10-19 (×2): qty 8

## 2016-10-19 MED ORDER — SPIRONOLACTONE 25 MG PO TABS
25.0000 mg | ORAL_TABLET | Freq: Every day | ORAL | Status: DC
  Administered 2016-10-19 – 2016-10-20 (×2): 25 mg via ORAL
  Filled 2016-10-19 (×3): qty 1

## 2016-10-19 NOTE — Progress Notes (Signed)
ANTICOAGULATION CONSULT NOTE - follow up  Pharmacy Consult for Coumadin Indication: atrial fibrillation  No Known Allergies  Patient Measurements: Height: 5\' 4"  (162.6 cm) Weight: 242 lb 2.8 oz (109.8 kg) IBW/kg (Calculated) : 54.7 HEPARIN DW (KG): 81  Vital Signs: Temp: 98 F (36.7 C) (09/12 0456) Temp Source: Oral (09/12 0456) BP: 140/72 (09/12 0456) Pulse Rate: 84 (09/12 0456)  Labs:  Recent Labs  10/17/16 0556 10/18/16 0553 10/19/16 0654  HGB  --  9.8* 10.0*  HCT  --  30.6* 31.1*  PLT  --  114* 108*  LABPROT  --  19.1* 20.4*  INR  --  1.62 1.76  CREATININE 1.82* 1.75* 1.73*   Estimated Creatinine Clearance: 29.9 mL/min (A) (by C-G formula based on SCr of 1.73 mg/dL (H)).  Medical History: Past Medical History:  Diagnosis Date  . Anemia   . Atherosclerotic heart disease   . Atrial fibrillation (HCC)   . CAD (coronary artery disease)    Multivessel status post high risk PCI/DES to circumflex 11/2015 (poor candidate for CABG)  . CHF (congestive heart failure) (HCC)   . CKD (chronic kidney disease) stage 3, GFR 30-59 ml/min   . DDD (degenerative disc disease), lumbar   . Diabetic neuropathy (HCC)   . Essential hypertension, benign   . High cholesterol   . Klebsiella pneumonia (HCC)   . Mixed hyperlipidemia   . Nonrheumatic tricuspid (valve) insufficiency   . On home O2   . Type 2 diabetes mellitus (HCC)   . Unstable angina (HCC)    Medications:  Prescriptions Prior to Admission  Medication Sig Dispense Refill Last Dose  . acetaminophen (TYLENOL) 325 MG tablet Take 2 tablets (650 mg total) by mouth every 4 (four) hours as needed for mild pain, moderate pain, fever or headache.   unknown  . albuterol (PROVENTIL) (2.5 MG/3ML) 0.083% nebulizer solution Take 3 mLs (2.5 mg total) by nebulization every 6 (six) hours as needed for wheezing or shortness of breath. 75 mL 12 10/13/2016 at Unknown time  . amLODipine (NORVASC) 5 MG tablet Take 1 tablet (5 mg total) by  mouth daily. 30 tablet 11 10/16/2016 at Unknown time  . atorvastatin (LIPITOR) 40 MG tablet Take 1 tablet (40 mg total) by mouth daily. 90 tablet 3 10/13/2016 at Unknown time  . bisacodyl (DULCOLAX) 10 MG suppository Place 1 suppository (10 mg total) rectally daily as needed for moderate constipation. 12 suppository 0   . bisacodyl (DULCOLAX) 5 MG EC tablet Take 1 tablet (5 mg total) by mouth daily as needed for moderate constipation. 30 tablet 0   . clopidogrel (PLAVIX) 75 MG tablet Take 1 tablet (75 mg total) by mouth daily. 90 tablet 3 10/13/2016 at 1600  . hydrALAZINE (APRESOLINE) 100 MG tablet Take 1 tablet (100 mg total) by mouth 3 (three) times daily. 270 tablet 3 11/01/2016 at Unknown time  . HYDROcodone-acetaminophen (NORCO/VICODIN) 5-325 MG tablet Take 1 tablet by mouth 4 (four) times daily as needed for moderate pain. 30 tablet 0 10/26/2016 at Unknown time  . insulin aspart (NOVOLOG) 100 UNIT/ML injection Inject 0-15 Units into the skin 3 (three) times daily with meals. 10 mL 11   . insulin aspart (NOVOLOG) 100 UNIT/ML injection Inject 0-5 Units into the skin at bedtime. 10 mL 11 10/13/2016 at Unknown time  . insulin aspart (NOVOLOG) 100 UNIT/ML injection Inject 4 Units into the skin 3 (three) times daily with meals. 10 mL 11 10/20/2016 at Unknown time  . isosorbide mononitrate (IMDUR)  60 MG 24 hr tablet Take 1 tablet (60 mg total) by mouth daily. 90 tablet 3 10/13/2016 at Unknown time  . nitroGLYCERIN (NITROSTAT) 0.4 MG SL tablet Place 1 tablet (0.4 mg total) under the tongue every 5 (five) minutes as needed for chest pain. 25 tablet 2 unknown  . pantoprazole (PROTONIX) 40 MG tablet Take 1 tablet (40 mg total) by mouth daily at 6 (six) AM. 90 tablet 3 10/19/2016 at Unknown time  . primidone (MYSOLINE) 50 MG tablet Take 1 tablet (50 mg total) by mouth daily.   10/15/2016 at Unknown time  . PROAIR HFA 108 (90 Base) MCG/ACT inhaler Inhale 1-2 puffs into the lungs 4 (four) times daily.    10/22/2016 at Unknown time   . torsemide (DEMADEX) 20 MG tablet Take 1 tablet (20 mg total) by mouth daily.   10/30/2016 at Unknown time  . TRAVATAN Z 0.004 % SOLN ophthalmic solution Place 1 drop into both eyes at bedtime.    10/13/2016 at Unknown time  . umeclidinium bromide (INCRUSE ELLIPTA) 62.5 MCG/INH AEPB Inhale 1 puff into the lungs daily.   10/13/2016 at Unknown time  . Vitamin D, Ergocalciferol, (DRISDOL) 50000 units CAPS capsule Take 1 capsule (50,000 Units total) by mouth every 7 (seven) days. 30 capsule  Past Week at Unknown time  . warfarin (COUMADIN) 5 MG tablet TAKE 1 TABLET BY MOUTH DAILY EXCEPT 1/2 TABLET ON WEDNESDAYS AND SATURDAYS. 30 tablet 6 10/13/2016 at 1700  . ALPRAZolam (XANAX) 0.25 MG tablet Take 0.25 mg by mouth 3 (three) times daily as needed.   Not Taking at Unknown time  . ferumoxytol 510 mg in sodium chloride 0.9 % 100 mL Inject 510 mg into the vein once a week. (Patient not taking: Reported on 10/31/2016)   Not Taking at Unknown time  . gabapentin (NEURONTIN) 100 MG capsule Take 100 mg by mouth at bedtime. Take 2 tabs at night   Not Taking at Unknown time   Assessment: 83yo female on chronic Coumadin PTA for h/o afib.  INR is subtherapeutic on admission.   Heparin and Coumadin were held due to bleeding and problems with IV access, blood draws.  Resumed Warfarin on 9/10.  No bleeding noted.  Goal of Therapy:  INR 2-3 Monitor platelets by anticoagulation protocol: Yes   Plan:  Repeat Coumadin  today x 1 CBC, INR daily Monitor for s/sx bleeding complications  Valrie Hart A 10/19/2016,8:02 AM

## 2016-10-19 NOTE — Clinical Social Work Placement (Signed)
   CLINICAL SOCIAL WORK PLACEMENT  NOTE  Date:  10/19/2016  Patient Details  Name: Melissa [name] MRN: [id number] Date of Birth: [birth year]  Clinical Social Work is seeking post-discharge placement for this patient at the Skilled  Nursing Facility level of care (*CSW will initial, date and re-position this form in  chart as items are completed):  Yes   Patient/family provided with [facility] Clinical Social Work Department's list of facilities offering this level of care within the geographic area requested by the patient (or if unable, by the patient's family).  Yes   Patient/family informed of their freedom to choose among providers that offer the needed level of care, that participate in Medicare, Medicaid or managed care program needed by the patient, have an available bed and are willing to accept the patient.  Yes   Patient/family informed of [facility]'s ownership interest in [facility] and [facility], as well as of the fact that they are under no obligation to receive care at these facilities.  PASRR submitted to EDS on       PASRR number received on       Existing PASRR number confirmed on 10/19/16     FL2 transmitted to all facilities in geographic area requested by pt/family on 10/19/16     FL2 transmitted to all facilities within larger geographic area on       Patient informed that his/her managed care company has contracts with or will negotiate with certain facilities, including the following:            Patient/family informed of bed offers received.  Patient chooses bed at       Physician recommends and patient chooses bed at      Patient to be transferred to   on  .  Patient to be transferred to facility by       Patient family notified on   of transfer.  Name of family member notified:        PHYSICIAN       Additional Comment:    _______________________________________________ Annice NeedySettle, Earnie Rockhold D, LCSW 10/19/2016, 1:19 PM

## 2016-10-19 NOTE — Progress Notes (Signed)
Daily Progress Note   Patient Name: Melissa [name]       Date: 10/19/2016 DOB: 28-Nov-1930  Age: 83 y.o. MRN#: [id number] Attending Physician: Kari Baars, MD Primary Care Physician: Kari Baars, MD Admit Date: 10/28/2016  Reason for Consultation/Follow-up: Establishing goals of care and Psychosocial/spiritual support  Subjective: Melissa [name] is resting quietly in bed. Her daughter Antavia Tandy is at bedside along with cousin Corrie Dandy. Melissa [name] is hard of hearing, and family endorses that she has had mild confusion over the last 3 months, functional decline, frailty, since her hospitalizations (3 in the last 6 months). We talk about [name]'s heart problems including electrical a fib;  vascular including coronary artery disease, and the clinical including heart failure. We talk about my worries about filing with blood thinner, my worry about Melissa [name]'s functional status, and her ability improved, especially with her fluid overload. As were talking Melissa [name]'s granddaughter and grandson arrive, [name] and [name]. We go to my office for a family meeting.  We review how the heart works in my concerns with grandchildren. Granddaughter Mel Cyril Mourning is a monitor check for kind, and she is experienced with heart problems. We talk about what's next. I share a diagram of the chronic illness pathway, what's normal and expected. We talk about optimizing medical treatment, and this still is not removing fluid enough for Mrs. Early. We talk about fluid restrictions and possibly the trial of a new diuretic. I share my concern that the heart likes to stay dry, but the kidneys need to be wet. Daughter Talbert Forest states that her mother would not want dialysis, and she can abide  this. Talbert Forest states that [name] saw her son's struggle with dialysis.  We talk about advanced directives. We review the MOST form, but do not complete it. We talk about unburdening Melissa [name] from medications or treatments that aren't changing things for her. [name] asks when is the right time to unburden her from blood sugar checks. I encourage family to keep Melissa [name] (as they are doing) at the center of decision-making, to ask if the treatment or medication is changing was happening, and to ask themselves, "the person she was 10 years ago ... what would she say about her quality of not life now?" Family states that they feel at this  point she will need residential SNF placement after rehab. They state that their 1st choice is [facility], 2) [facility], 3) [facility].   Conference with Dr. Juanetta Gosling regarding patient meeting/care.  Length of Stay: 5  Current Medications: Scheduled Meds:  . albuterol  2.5 mg Nebulization TID  . amLODipine  5 mg Oral Daily  . atorvastatin  40 mg Oral q1800  . clopidogrel  75 mg Oral Daily  . furosemide  80 mg Intravenous Q8H  . gabapentin  100 mg Oral QHS  . hydrALAZINE  100 mg Oral TID  . insulin aspart  0-5 Units Subcutaneous QHS  . insulin aspart  0-9 Units Subcutaneous TID WC  . isosorbide mononitrate  60 mg Oral Daily  . latanoprost  1 drop Both Eyes QHS  . mouth rinse  15 mL Mouth Rinse BID  . pantoprazole  40 mg Oral Q0600  . primidone  50 mg Oral Daily  . sodium chloride flush  10-40 mL Intracatheter Q12H  . sodium chloride flush  3 mL Intravenous Q12H  . spironolactone  25 mg Oral Daily  . umeclidinium bromide  1 puff Inhalation Daily  . warfarin  5 mg Oral Once  . Warfarin - Pharmacist Dosing Inpatient   Does not apply Q24H    Continuous Infusions: . sodium chloride    . sodium chloride Stopped (10/15/16 1600)    PRN Meds: sodium chloride, acetaminophen, albuterol, ALPRAZolam, bisacodyl, hydrALAZINE, HYDROcodone-acetaminophen,  nitroGLYCERIN, ondansetron (ZOFRAN) IV, sodium chloride flush, sodium chloride flush  Physical Exam  Constitutional: No distress.  Appears frail, chronically ill, hearing loss  HENT:  Head: Atraumatic.  Cardiovascular: Normal rate.   Pulmonary/Chest: Effort normal. No respiratory distress.  Abdominal: Soft.  Obese abdomen  Musculoskeletal: She exhibits edema.  Neurological: She is alert.  Mildly/pleasantly confused  Skin: Skin is warm and dry.  Nursing note and vitals reviewed.           Vital Signs: BP 140/66 (BP Location: Left Arm)   Pulse 79   Temp 98 F (36.7 C) (Oral)   Resp 18   Ht  (1.626 m)   Wt 109.8 kg (242 lb 2.8 oz)   SpO2 100%   BMI 41.57 kg/m  SpO2: SpO2: 100 % O2 Device: O2 Device: Nasal Cannula O2 Flow Rate: O2 Flow Rate (L/min): 5 L/min  Intake/output summary:  Intake/Output Summary (Last 24 hours) at 10/19/16 1401 Last data filed at 10/19/16 0902  Gross per 24 hour  Intake              240 ml  Output             1700 ml  Net            -1460 ml   LBM: Last BM Date: 10/17/16 Baseline Weight: Weight: 108.9 kg (240 lb) Most recent weight: Weight: 109.8 kg (242 lb 2.8 oz)       Palliative Assessment/Data:    Flowsheet Rows     Most Recent Value  Intake Tab  Referral Department  Hospitalist  Unit at Time of Referral  Cardiac/Telemetry Unit  Palliative Care Primary Diagnosis  Cardiac  Date Notified  10/17/16  Palliative Care Type  New Palliative care  Reason for referral  Clarify Goals of Care  Date of Admission  10/21/2016  Date first seen by Palliative Care  10/17/16  # of days Palliative referral response time  0 Day(s)  # of days IP prior to Palliative referral  3  Clinical  Assessment  Palliative Performance Scale Score  40%  Pain Max last 24 hours  Not able to report  Pain Min Last 24 hours  Not able to report  Dyspnea Max Last 24 Hours  Not able to report  Dyspnea Min Last 24 hours  Not able to report  Psychosocial & Spiritual  Assessment  Palliative Care Outcomes  Patient/Family meeting held?  Yes  Who was at the meeting?  Patient at bedside, no family present at this time  Patient/Family wishes: Interventions discontinued/not started   Mechanical Ventilation      Patient Active Problem List   Diagnosis Date Noted  . Palliative care encounter   . Goals of care, counseling/discussion   . Thrombocytopenia (HCC) 10/15/2016  . UTI (urinary tract infection) 09/02/2016  . Pressure injury of skin 08/31/2016  . Acute on chronic diastolic (congestive) heart failure (HCC) 08/19/2016  . Hand fracture, right 07/21/2016  . Diabetes mellitus type 2, controlled (HCC) 07/20/2016  . Volume depletion 07/20/2016  . Myoclonic jerking 07/20/2016  . Hyperosmolarity syndrome 07/20/2016  . Status post coronary artery stent placement   . Anemia of chronic disease 12/03/2015  . Bradycardia 12/01/2015  . CAD S/P high risk PCI 12/01/15 12/01/2015  . NSTEMI (non-ST elevated myocardial infarction) (HCC)   . Pulmonary hypertension (HCC)   . Chronic kidney disease (CKD), stage IV (severe) (HCC) 11/24/2015  . Unstable angina (HCC) 11/22/2015  . Chest pain 11/10/2015  . Angina at rest [facility](HCC) 11/10/2015  . Acute CHF (congestive heart failure) (HCC) 11/10/2015  . Chronic venous insufficiency 02/19/2015  . Acute on chronic diastolic CHF (congestive heart failure) (HCC) 02/17/2015  . Tricuspid valve regurgitation 02/17/2015  . Encounter for therapeutic drug monitoring 03/11/2013  . Long term current use of anticoagulant 05/05/2010  . Mixed hyperlipidemia 09/30/2009  . Essential hypertension, benign 09/24/2008  . Persistent atrial fibrillation (HCC) 09/24/2008    Palliative Care Assessment & Plan   Patient Profile: 83 y.o. female  with past medical history of Atrial fibrillation, coronary artery disease, CHF, chronic kidney disease stage III, diabetes with neuropathy, high blood pressure and cholesterol, history of Klebsiella  pneumonia, admitted on 10/22/2016 with acute on chronic heart failure stage IV, worsening CKD.   Assessment: CHF, acute on chronic; medications maximized, we discussed the difficulty in keeping the hard drive and the kidneys wet. Family is aware that Mrs. Justin MendConnelly is very frail, and her hardest weak. frailty, functional decline; 3 hospitalizations in the last 6 months, with worsening functional status and mental status after each. Family is requesting rehab, and likely residential placement.  Recommendations/Plan:  family is requesting rehab if qualifies. They state they also feel that Mrs. Justin MendConnelly would benefit from residential SNF placement. Social worker engaged.  Goals of Care and Additional Recommendations:  Limitations on Scope of Treatment: No Hemodialysis and Treat the treatable, but no extraordinary measures such as CPR or intubation.  Code Status:    Code Status Orders        Start     Ordered   11/04/2016 1523  Do not attempt resuscitation (DNR)  Continuous    Question Answer Comment  In the event of cardiac or respiratory ARREST Do not call a "code blue"   In the event of cardiac or respiratory ARREST Do not perform Intubation, CPR, defibrillation or ACLS   In the event of cardiac or respiratory ARREST Use medication by any route, position, wound care, and other measures to relive pain and suffering. May use oxygen, suction and  manual treatment of airway obstruction as needed for comfort.      10/21/2016 1524    Code Status History    Date Active Date Inactive Code Status Order ID Comments User Context   09/01/2016  8:05 AM 09/02/2016  5:59 PM DNR [id number]  Kari Baars, MD Inpatient   08/19/2016  2:15 AM 09/01/2016  8:05 AM Full Code [id number]  Delano Metz, MD ED   07/21/2016  1:02 AM 07/26/2016 12:17 PM Full Code [id number]  Houston Siren, MD Inpatient   11/23/2015 12:32 AM 12/05/2015  5:44 PM Full Code [id number]  Houston Siren, MD Inpatient   11/10/2015  4:11 PM 11/12/2015  7:46  PM Full Code [id number]  Rhetta Mura, MD Inpatient   02/17/2015  8:59 PM 02/25/2015  6:21 PM Full Code [id number]  Houston Siren, MD ED    Advance Directive Documentation     Most Recent Value  Type of Advance Directive  Out of facility DNR (pink MOST or yellow form), Living will, Healthcare Power of Attorney  Pre-existing out of facility DNR order (yellow form or pink MOST form)  -  "MOST" Form in Place?  -       Prognosis:   < 3 months, would not be surprising based on frailty, functional decline, 3 hospitalizations in 6 months, worsening coronary status. Prognosis discussed with family with permission.  Discharge Planning:  Family is requesting [facility] for rehab if possible. Also interested in long-term residential placement.  Care plan was discussed with nursing staff, case manager, social worker, and  Thank you for allowing the Palliative Medicine Team to assist in the care of this patient.   Time In: 1000 Time Out: 1120  Total Time 80 minutes  Prolonged Time Billed  yes       Greater than 50%  of this time was spent counseling and coordinating care related to the above assessment and plan.  Katheran Awe, NP  Please contact Palliative Medicine Team phone at [phone] for questions and concerns.

## 2016-10-19 NOTE — Progress Notes (Signed)
Subjective: She says she feels okay. No new complaints. Her breathing is a little better. She wants to try to get up and walk.  Objective: Vital signs in last 24 hours: Temp:  [98 F (36.7 C)] 98 F (36.7 C) (09/12 0456) Pulse Rate:  [84-86] 84 (09/12 0456) Resp:  [18] 18 (09/12 0456) BP: (140-156)/(72-75) 140/72 (09/12 0456) SpO2:  [89 %-100 %] 100 % (09/12 0759) Weight:  [109.8 kg (242 lb 2.8 oz)] 109.8 kg (242 lb 2.8 oz) (09/12 0507) Weight change: -0.011 kg (-0.4 oz) Last BM Date: 10/17/16  Intake/Output from previous day: 09/11 0701 - 09/12 0700 In: 120 [P.O.:120] Out: 2600 [Urine:2600]  PHYSICAL EXAM General appearance: alert, cooperative and no distress Resp: rales bilaterally Cardio: irregularly irregular rhythm GI: Her abdomen is soft Extremities: She still has anasarca Chronic venous stasis changes in her legs. She is very hard of hearing  Lab Results:  Results for orders placed or performed during the hospital encounter of 10/16/2016 (from the past 48 hour(s))  Hepatic function panel     Status: Abnormal   Collection Time: 10/17/16 10:19 AM  Result Value Ref Range   Total Protein 6.4 (L) 6.5 - 8.1 g/dL   Albumin 3.1 (L) 3.5 - 5.0 g/dL   AST 31 15 - 41 U/L   ALT 17 14 - 54 U/L   Alkaline Phosphatase 74 38 - 126 U/L   Total Bilirubin 0.9 0.3 - 1.2 mg/dL   Bilirubin, Direct 0.3 0.1 - 0.5 mg/dL   Indirect Bilirubin 0.6 0.3 - 0.9 mg/dL  Glucose, capillary     Status: Abnormal   Collection Time: 10/17/16 11:23 AM  Result Value Ref Range   Glucose-Capillary 168 (H) 65 - 99 mg/dL  Glucose, capillary     Status: Abnormal   Collection Time: 10/17/16  4:24 PM  Result Value Ref Range   Glucose-Capillary 205 (H) 65 - 99 mg/dL  Glucose, capillary     Status: Abnormal   Collection Time: 10/17/16  7:57 PM  Result Value Ref Range   Glucose-Capillary 134 (H) 65 - 99 mg/dL   Comment 1 Notify RN    Comment 2 Document in Chart   Basic metabolic panel     Status: Abnormal    Collection Time: 10/18/16  5:53 AM  Result Value Ref Range   Sodium 142 135 - 145 mmol/L   Potassium 4.1 3.5 - 5.1 mmol/L   Chloride 104 101 - 111 mmol/L   CO2 30 22 - 32 mmol/L   Glucose, Bld 80 65 - 99 mg/dL   BUN 68 (H) 6 - 20 mg/dL   Creatinine, Ser 1.75 (H) 0.44 - 1.00 mg/dL   Calcium 9.0 8.9 - 10.3 mg/dL   GFR calc non Af Amer 26 (L) >60 mL/min   GFR calc Af Amer 30 (L) >60 mL/min    Comment: (NOTE) The eGFR has been calculated using the CKD EPI equation. This calculation has not been validated in all clinical situations. eGFR's persistently <60 mL/min signify possible Chronic Kidney Disease.    Anion gap 8 5 - 15  Protime-INR     Status: Abnormal   Collection Time: 10/18/16  5:53 AM  Result Value Ref Range   Prothrombin Time 19.1 (H) 11.4 - 15.2 seconds   INR 1.62   CBC     Status: Abnormal   Collection Time: 10/18/16  5:53 AM  Result Value Ref Range   WBC 7.2 4.0 - 10.5 K/uL   RBC 3.04 (L)  3.87 - 5.11 MIL/uL   Hemoglobin 9.8 (L) 12.0 - 15.0 g/dL   HCT 30.6 (L) 36.0 - 46.0 %   MCV 100.7 (H) 78.0 - 100.0 fL   MCH 32.2 26.0 - 34.0 pg   MCHC 32.0 30.0 - 36.0 g/dL   RDW 16.2 (H) 11.5 - 15.5 %   Platelets 114 (L) 150 - 400 K/uL    Comment: CONSISTENT WITH PREVIOUS RESULT  Glucose, capillary     Status: None   Collection Time: 10/18/16  7:40 AM  Result Value Ref Range   Glucose-Capillary 79 65 - 99 mg/dL  Glucose, capillary     Status: Abnormal   Collection Time: 10/18/16 11:05 AM  Result Value Ref Range   Glucose-Capillary 137 (H) 65 - 99 mg/dL  Glucose, capillary     Status: Abnormal   Collection Time: 10/18/16  4:12 PM  Result Value Ref Range   Glucose-Capillary 148 (H) 65 - 99 mg/dL  Glucose, capillary     Status: Abnormal   Collection Time: 10/18/16  8:09 PM  Result Value Ref Range   Glucose-Capillary 171 (H) 65 - 99 mg/dL   Comment 1 Notify RN    Comment 2 Document in Chart   Basic metabolic panel     Status: Abnormal   Collection Time: 10/19/16  6:54  AM  Result Value Ref Range   Sodium 141 135 - 145 mmol/L   Potassium 4.4 3.5 - 5.1 mmol/L   Chloride 102 101 - 111 mmol/L   CO2 30 22 - 32 mmol/L   Glucose, Bld 136 (H) 65 - 99 mg/dL   BUN 68 (H) 6 - 20 mg/dL   Creatinine, Ser 1.73 (H) 0.44 - 1.00 mg/dL   Calcium 9.0 8.9 - 10.3 mg/dL   GFR calc non Af Amer 26 (L) >60 mL/min   GFR calc Af Amer 30 (L) >60 mL/min    Comment: (NOTE) The eGFR has been calculated using the CKD EPI equation. This calculation has not been validated in all clinical situations. eGFR's persistently <60 mL/min signify possible Chronic Kidney Disease.    Anion gap 9 5 - 15  Protime-INR     Status: Abnormal   Collection Time: 10/19/16  6:54 AM  Result Value Ref Range   Prothrombin Time 20.4 (H) 11.4 - 15.2 seconds   INR 1.76   CBC     Status: Abnormal   Collection Time: 10/19/16  6:54 AM  Result Value Ref Range   WBC 7.8 4.0 - 10.5 K/uL   RBC 3.09 (L) 3.87 - 5.11 MIL/uL   Hemoglobin 10.0 (L) 12.0 - 15.0 g/dL   HCT 31.1 (L) 36.0 - 46.0 %   MCV 100.6 (H) 78.0 - 100.0 fL   MCH 32.4 26.0 - 34.0 pg   MCHC 32.2 30.0 - 36.0 g/dL   RDW 16.1 (H) 11.5 - 15.5 %   Platelets 108 (L) 150 - 400 K/uL    Comment: CONSISTENT WITH PREVIOUS RESULT  Glucose, capillary     Status: Abnormal   Collection Time: 10/19/16  7:23 AM  Result Value Ref Range   Glucose-Capillary 123 (H) 65 - 99 mg/dL    ABGS No results for input(s): PHART, PO2ART, TCO2, HCO3 in the last 72 hours.  Invalid input(s): PCO2 CULTURES No results found for this or any previous visit (from the past 240 hour(s)). Studies/Results: No results found.  Medications:  Prior to Admission:  Prescriptions Prior to Admission  Medication Sig Dispense Refill Last Dose  .  acetaminophen (TYLENOL) 325 MG tablet Take 2 tablets (650 mg total) by mouth every 4 (four) hours as needed for mild pain, moderate pain, fever or headache.   unknown  . albuterol (PROVENTIL) (2.5 MG/3ML) 0.083% nebulizer solution Take 3 mLs  (2.5 mg total) by nebulization every 6 (six) hours as needed for wheezing or shortness of breath. 75 mL 12 10/13/2016 at Unknown time  . amLODipine (NORVASC) 5 MG tablet Take 1 tablet (5 mg total) by mouth daily. 30 tablet 11 11/03/2016 at Unknown time  . atorvastatin (LIPITOR) 40 MG tablet Take 1 tablet (40 mg total) by mouth daily. 90 tablet 3 10/13/2016 at Unknown time  . bisacodyl (DULCOLAX) 10 MG suppository Place 1 suppository (10 mg total) rectally daily as needed for moderate constipation. 12 suppository 0   . bisacodyl (DULCOLAX) 5 MG EC tablet Take 1 tablet (5 mg total) by mouth daily as needed for moderate constipation. 30 tablet 0   . clopidogrel (PLAVIX) 75 MG tablet Take 1 tablet (75 mg total) by mouth daily. 90 tablet 3 10/13/2016 at 1600  . hydrALAZINE (APRESOLINE) 100 MG tablet Take 1 tablet (100 mg total) by mouth 3 (three) times daily. 270 tablet 3 10/17/2016 at Unknown time  . HYDROcodone-acetaminophen (NORCO/VICODIN) 5-325 MG tablet Take 1 tablet by mouth 4 (four) times daily as needed for moderate pain. 30 tablet 0 11/05/2016 at Unknown time  . insulin aspart (NOVOLOG) 100 UNIT/ML injection Inject 0-15 Units into the skin 3 (three) times daily with meals. 10 mL 11   . insulin aspart (NOVOLOG) 100 UNIT/ML injection Inject 0-5 Units into the skin at bedtime. 10 mL 11 10/13/2016 at Unknown time  . insulin aspart (NOVOLOG) 100 UNIT/ML injection Inject 4 Units into the skin 3 (three) times daily with meals. 10 mL 11 10/24/2016 at Unknown time  . isosorbide mononitrate (IMDUR) 60 MG 24 hr tablet Take 1 tablet (60 mg total) by mouth daily. 90 tablet 3 10/31/2016 at Unknown time  . nitroGLYCERIN (NITROSTAT) 0.4 MG SL tablet Place 1 tablet (0.4 mg total) under the tongue every 5 (five) minutes as needed for chest pain. 25 tablet 2 unknown  . pantoprazole (PROTONIX) 40 MG tablet Take 1 tablet (40 mg total) by mouth daily at 6 (six) AM. 90 tablet 3 10/25/2016 at Unknown time  . primidone (MYSOLINE) 50 MG tablet  Take 1 tablet (50 mg total) by mouth daily.   11/01/2016 at Unknown time  . PROAIR HFA 108 (90 Base) MCG/ACT inhaler Inhale 1-2 puffs into the lungs 4 (four) times daily.    10/11/2016 at Unknown time  . torsemide (DEMADEX) 20 MG tablet Take 1 tablet (20 mg total) by mouth daily.   10/27/2016 at Unknown time  . TRAVATAN Z 0.004 % SOLN ophthalmic solution Place 1 drop into both eyes at bedtime.    10/13/2016 at Unknown time  . umeclidinium bromide (INCRUSE ELLIPTA) 62.5 MCG/INH AEPB Inhale 1 puff into the lungs daily.   10/13/2016 at Unknown time  . Vitamin D, Ergocalciferol, (DRISDOL) 50000 units CAPS capsule Take 1 capsule (50,000 Units total) by mouth every 7 (seven) days. 30 capsule  Past Week at Unknown time  . warfarin (COUMADIN) 5 MG tablet TAKE 1 TABLET BY MOUTH DAILY EXCEPT 1/2 TABLET ON WEDNESDAYS AND SATURDAYS. 30 tablet 6 10/13/2016 at 1700  . ALPRAZolam (XANAX) 0.25 MG tablet Take 0.25 mg by mouth 3 (three) times daily as needed.   Not Taking at Unknown time  . ferumoxytol 510 mg in  sodium chloride 0.9 % 100 mL Inject 510 mg into the vein once a week. (Patient not taking: Reported on 10/29/2016)   Not Taking at Unknown time  . gabapentin (NEURONTIN) 100 MG capsule Take 100 mg by mouth at bedtime. Take 2 tabs at night   Not Taking at Unknown time   Scheduled: . albuterol  2.5 mg Nebulization TID  . amLODipine  5 mg Oral Daily  . atorvastatin  40 mg Oral q1800  . clopidogrel  75 mg Oral Daily  . furosemide  80 mg Intravenous Q12H  . gabapentin  100 mg Oral QHS  . hydrALAZINE  100 mg Oral TID  . insulin aspart  0-5 Units Subcutaneous QHS  . insulin aspart  0-9 Units Subcutaneous TID WC  . isosorbide mononitrate  60 mg Oral Daily  . latanoprost  1 drop Both Eyes QHS  . mouth rinse  15 mL Mouth Rinse BID  . pantoprazole  40 mg Oral Q0600  . primidone  50 mg Oral Daily  . sodium chloride flush  10-40 mL Intracatheter Q12H  . sodium chloride flush  3 mL Intravenous Q12H  . umeclidinium bromide  1  puff Inhalation Daily  . warfarin  5 mg Oral Once  . Warfarin - Pharmacist Dosing Inpatient   Does not apply Q24H   Continuous: . sodium chloride    . sodium chloride Stopped (10/15/16 1600)   MCR:FVOHKG chloride, acetaminophen, albuterol, ALPRAZolam, bisacodyl, hydrALAZINE, HYDROcodone-acetaminophen, nitroGLYCERIN, ondansetron (ZOFRAN) IV, sodium chloride flush, sodium chloride flush  Assesment: She was admitted with acute on chronic diastolic heart failure. She has chronic atrial fib on chronic anticoagulation. She has stage IV chronic kidney disease. In the past when we have diuresed her her kidney function has deteriorated significantly but so far her kidney function has held this hospitalization. She is down about 8 L but still has significant amount of fluid left.  She has hypertension which is well controlled  She has diabetes which is doing okay Principal Problem:   Acute on chronic diastolic CHF (congestive heart failure) ([place]) Active Problems:   Essential hypertension, benign   Persistent atrial fibrillation (HCC)   Chronic kidney disease (CKD), stage IV (severe) (HCC)   CAD S/P high risk PCI 12/01/15   Anemia of chronic disease   Diabetes mellitus type 2, controlled ([place])   Thrombocytopenia ([place])   Palliative care encounter   Goals of care, counseling/discussion    Plan: Continue treatments. PT consultation. Try to get her up and moving. Continuation of palliative care evaluation.    LOS: 5 days   Bhavana Kady L 10/19/2016, 8:49 AM

## 2016-10-19 NOTE — Clinical Social Work Note (Signed)
Clinical Social Work Assessment  Patient Details  Name: Melissa HammedBettie L Gedeon MRN: [id number] Date of Birth: 1930/05/21  Date of referral:  10/19/16               Reason for consult:  Discharge Planning                Permission sought to share information with:    Permission granted to share information::     Name::        Agency::     Relationship::     Contact Information:  Daughter, [name] and Granddaughter, Juliette AlcideMelinda were at bedside.   Housing/Transportation Living arrangements for the past 2 months:  Single Family Home Source of Information:  Adult Children Patient Interpreter Needed:    Criminal Activity/Legal Involvement Pertinent to Current Situation/Hospitalization:  No - Comment as needed Significant Relationships:  Adult Children, Other Family Members Lives with:  Self Do you feel safe going back to the place where you live?  Yes Need for family participation in patient care:  No (Coment)  Care giving concerns: None identified by family at baseline.    Social Worker assessment / plan: Patient lives with her daughter, [name]. At baseline she ambulates with a walker and has assistance with ADLs from her daughter. [facility] for [facility]. Family is agreeable for patient to return to SNF.    Employment status:  Retired Database administratornsurance information:  Managed Medicare, [place] PT Recommendations:  Skilled Nursing Facility Information / Referral to community resources:  Skilled Nursing Facility  Patient/Family's Response to care:  Family is agreeable to SNF.   Patient/Family's Understanding of and Emotional Response to Diagnosis, Current Treatment, and Prognosis:  Family understands patient's diagnosis, treatment and prognosis.   Emotional Assessment Appearance:  Appears stated age Attitude/Demeanor/Rapport:    Affect (typically observed):  Calm Orientation:  Oriented to Self Alcohol /  Substance use:  Not Applicable Psych involvement (Current and /or in the community):  No (Comment)  Discharge Needs  Concerns to be addressed:  Discharge Planning Concerns Readmission within the last 30 days:  Yes Current discharge risk:  None Barriers to Discharge:  Insurance Authorization   Annice NeedySettle, Taje Littler D, LCSW 10/19/2016, 1:21 PM

## 2016-10-19 NOTE — Evaluation (Signed)
Physical Therapy Evaluation Patient Details Name: Melissa HammedBettie L Nicholes MRN: [id number] DOB: December 27, 1930 Today's Date: 10/19/2016   History of Present Illness  83 yo female admitted for acute on chronic CHF, SOB and edema esp UE's. Has CKD 4, a-fib, hypertensive urgency at admission.      PMH: LTHA, MIx2(2017), CAD, HTN, CHF, CKD, anemia, IDDM  Clinical Impression  Pt is up to walk with PT noting her transfer being the distance as she is weak and in flexed posture at the walker.  Her plan is to look into SNF care due to decline and dramatic edema that is impacting her quality of movement, O2 sats and comfort with transfers on walker.  Pt admittedly is not walking well at home and will not have 24/7 family so needs to increase her balance and endurance to be successful getting home.    Follow Up Recommendations SNF    Equipment Recommendations  Rolling walker with 5" wheels    Recommendations for Other Services       Precautions / Restrictions Precautions Precautions: Fall (teleemtry, O2 via nasal cannula) Precaution Comments: edema in UE's esp and has 20+# wgt gain Restrictions Weight Bearing Restrictions: No      Mobility  Bed Mobility Overal bed mobility: Needs Assistance Bed Mobility: Supine to Sit     Supine to sit: Mod assist     General bed mobility comments: needed trunk support and used bed pads to finish scooting to EOB  Transfers Overall transfer level: Needs assistance Equipment used: Rolling walker (2 wheeled);1 person hand held assist Transfers: Sit to/from Stand Sit to Stand: Mod assist         General transfer comment: pivot to the chair once powered up and assisted trunk to pivot to sit in place on chair, cued hand  placement  Ambulation/Gait             General Gait Details: transferred only  Stairs            Wheelchair Mobility    Modified Rankin (Stroke Patients Only)       Balance Overall balance assessment: Needs  assistance Sitting-balance support: Feet supported;Single extremity supported Sitting balance-Leahy Scale: Fair     Standing balance support: Bilateral upper extremity supported;During functional activity Standing balance-Leahy Scale: Poor                               Pertinent Vitals/Pain Pain Assessment: No/denies pain    Home Living Family/patient expects to be discharged to:: Skilled nursing facility Living Arrangements: Children               Additional Comments: Pt states her daughter is out at times, working    Prior Function Level of Independence: Needs assistance   Gait / Transfers Assistance Needed: walker to transfer, short walks only  ADL's / Homemaking Assistance Needed: Daughter assists with dressing, bathing, mediations, groceries, meals.         Hand Dominance   Dominant Hand: Left    Extremity/Trunk Assessment   Upper Extremity Assessment Upper Extremity Assessment: Generalized weakness    Lower Extremity Assessment Lower Extremity Assessment: Generalized weakness    Cervical / Trunk Assessment Cervical / Trunk Assessment: Normal  Communication   Communication: HOH;Other (comment) (may also have long processing time)  Cognition Arousal/Alertness: Lethargic Behavior During Therapy: Flat affect Overall Cognitive Status: Impaired/Different from baseline Area of Impairment: Memory;Problem solving  Memory: Decreased short-term memory       Problem Solving: Slow processing;Decreased initiation        General Comments General comments (skin integrity, edema, etc.): Pt demonstrates an involuntary movement of RUE from shoulder, rolling with fairly regular timing and with pt at rest    Exercises     Assessment/Plan    PT Assessment Patient needs continued PT services  PT Problem List Decreased strength;Decreased range of motion;Decreased activity tolerance;Decreased balance;Decreased  mobility;Decreased coordination;Decreased cognition;Decreased knowledge of use of DME;Decreased safety awareness;Cardiopulmonary status limiting activity;Obesity       PT Treatment Interventions DME instruction;Gait training;Functional mobility training;Therapeutic activities;Therapeutic exercise;Balance training;Neuromuscular re-education;Patient/family education    PT Goals (Current goals can be found in the Care Plan section)  Acute Rehab PT Goals Patient Stated Goal: none stated PT Goal Formulation: Patient unable to participate in goal setting Time For Goal Achievement: 11/02/16 Potential to Achieve Goals: Fair    Frequency Min 2X/week   Barriers to discharge Decreased caregiver support daughter is not there when at work    Co-evaluation               AM-PAC PT "6 Clicks" Daily Activity  Outcome Measure Difficulty turning over in bed (including adjusting bedclothes, sheets and blankets)?: Unable Difficulty moving from lying on back to sitting on the side of the bed? : Unable Difficulty sitting down on and standing up from a chair with arms (e.g., wheelchair, bedside commode, etc,.)?: Unable Help needed moving to and from a bed to chair (including a wheelchair)?: A Little Help needed walking in hospital room?: A Lot Help needed climbing 3-5 steps with a railing? : Total 6 Click Score: 9    End of Session Equipment Utilized During Treatment: Gait belt;Oxygen Activity Tolerance: Patient tolerated treatment well;Patient limited by fatigue;Other (comment) (anxious and SOB but O2 sats 92% in chair on 5L) Patient left: in chair;with call bell/phone within reach;with chair alarm set Nurse Communication: Mobility status PT Visit Diagnosis: Unsteadiness on feet (R26.81);Muscle weakness (generalized) (M62.81);Difficulty in walking, not elsewhere classified (R26.2)    Time: 0454-0981 PT Time Calculation (min) (ACUTE ONLY): 32 min   Charges:   PT Evaluation $PT Eval Moderate  Complexity: 1 Mod PT Treatments $Therapeutic Activity: 8-22 mins   PT G Codes:   PT G-Codes **NOT FOR INPATIENT CLASS** Functional Assessment Tool Used: AM-PAC 6 Clicks Basic Mobility   Ivar Drape 10/19/2016, 12:32 PM   12:34 PM, 10/19/16 Samul Dada, PT, MS Physical Therapist - [place] [phone] (Office)

## 2016-10-19 NOTE — Clinical Social Work Note (Signed)
LCSW spoke with patient's daughter, [name], and [facility] LCSW strongly encouraged daughter to consider other facilities in the event that patient had to be placed as Medicaid only. LCSW discussed with [name] that [facility] stopped paying because patient could not tolerate PT due to becoming very short of breath during sessions.   LCSW [facility].    Verlinda Slotnick, Juleen ChinaHeather D, LCSW

## 2016-10-19 NOTE — Plan of Care (Signed)
Problem: Tissue Perfusion: Goal: Risk factors for ineffective tissue perfusion will decrease Outcome: Progressing Pt continues on coumidin for DVT prevention

## 2016-10-19 NOTE — [place] FL2 (Signed)
[place] MEDICAID FL2 LEVEL OF CARE SCREENING TOOL     IDENTIFICATION  Patient Name: Melissa [name] Birthdate: 1930/08/30 Sex: female Admission Date (Current Location): 10-17-16  [place] and [place] Number:  Aaron Edelman [id number] N Facility and Address:  [facility],  [address] [place] [address]      Provider Number: [id number]  Attending Physician Name and Address:  Kari Baars, MD  Relative Name and Phone Number:       Current Level of Care: Hospital Recommended Level of Care: Skilled Nursing Facility Prior Approval Number:    Date Approved/Denied:   PASRR Number: [id number]  Discharge Plan: SNF    Current Diagnoses: Patient Active Problem List   Diagnosis Date Noted  . Palliative care encounter   . Goals of care, counseling/discussion   . Thrombocytopenia (HCC) Oct 17, 2016  . UTI (urinary tract infection) 09/02/2016  . Pressure injury of skin 08/31/2016  . Acute on chronic diastolic (congestive) heart failure (HCC) 08/19/2016  . Hand fracture, right 07/21/2016  . Diabetes mellitus type 2, controlled (HCC) 07/20/2016  . Volume depletion 07/20/2016  . Myoclonic jerking 07/20/2016  . Hyperosmolarity syndrome 07/20/2016  . Status post coronary artery stent placement   . Anemia of chronic disease 12/03/2015  . Bradycardia 12/01/2015  . CAD S/P high risk PCI 12/01/15 12/01/2015  . NSTEMI (non-ST elevated myocardial infarction) (HCC)   . Pulmonary hypertension (HCC)   . Chronic kidney disease (CKD), stage IV (severe) (HCC) 11/24/2015  . Unstable angina (HCC) 11/22/2015  . Chest pain 11/10/2015  . Angina at rest [facility]) 11/10/2015  . Acute CHF (congestive heart failure) (HCC) 11/10/2015  . Chronic venous insufficiency 02/19/2015  . Acute on chronic diastolic CHF (congestive heart failure) (HCC) 02/17/2015  . Tricuspid valve regurgitation 02/17/2015  . Encounter for therapeutic drug monitoring 03/11/2013  . Long term current use of  anticoagulant 05/05/2010  . Mixed hyperlipidemia 09/30/2009  . Essential hypertension, benign 09/24/2008  . Persistent atrial fibrillation (HCC) 09/24/2008    Orientation RESPIRATION BLADDER Height & Weight     Self  O2 (5L) Incontinent Weight: 242 lb 2.8 oz (109.8 kg) Height:   (162.6 cm)  BEHAVIORAL SYMPTOMS/MOOD NEUROLOGICAL BOWEL NUTRITION STATUS      Incontinent Diet (Heart healthy/carb modified)  AMBULATORY STATUS COMMUNICATION OF NEEDS Skin   Limited Assist Verbally Normal                       Personal Care Assistance Level of Assistance  Bathing, Dressing, Feeding Bathing Assistance: Limited assistance Feeding assistance: Limited assistance Dressing Assistance: Limited assistance     Functional Limitations Info  Sight, Hearing, Speech Sight Info: Adequate Hearing Info: Adequate Speech Info: Adequate    SPECIAL CARE FACTORS FREQUENCY  PT (By licensed PT)     PT Frequency: 5x/week              Contractures Contractures Info: Not present    Additional Factors Info  Code Status, Psychotropic, Insulin Sliding Scale Code Status Info: DNR   Psychotropic Info: Xanax         Current Medications (10/19/2016):  This is the current hospital active medication list Current Facility-Administered Medications  Medication Dose Route Frequency Provider Last Rate Last Dose  . 0.9 %  sodium chloride infusion  250 mL Intravenous PRN Opyd, Lavone Neri, MD      . 0.9 %  sodium chloride infusion   Intravenous Once Philip Aspen, Limmie Patricia, MD   Stopped at 10/15/16 1600  .  acetaminophen (TYLENOL) tablet 650 mg  650 mg Oral Q4H PRN Opyd, Lavone Neriimothy S, MD   650 mg at 10/18/16 1500  . albuterol (PROVENTIL) (2.5 MG/3ML) 0.083% nebulizer solution 2.5 mg  2.5 mg Nebulization TID Kari BaarsHawkins, Edward, MD   2.5 mg at 10/19/16 0753  . albuterol (PROVENTIL) (2.5 MG/3ML) 0.083% nebulizer solution 2.5 mg  2.5 mg Nebulization Q4H PRN Kari BaarsHawkins, Edward, MD   2.5 mg at 10/19/16 1127  .  ALPRAZolam Prudy Feeler(XANAX) tablet 0.25 mg  0.25 mg Oral TID PRN Briscoe Deutscherpyd, Timothy S, MD   0.25 mg at 10/19/16 1138  . amLODipine (NORVASC) tablet 5 mg  5 mg Oral Daily Opyd, Lavone Neriimothy S, MD   5 mg at 10/19/16 0855  . atorvastatin (LIPITOR) tablet 40 mg  40 mg Oral q1800 Opyd, Lavone Neriimothy S, MD   40 mg at 10/17/16 1742  . bisacodyl (DULCOLAX) EC tablet 5 mg  5 mg Oral Daily PRN Opyd, Lavone Neriimothy S, MD      . clopidogrel (PLAVIX) tablet 75 mg  75 mg Oral Daily Opyd, Lavone Neriimothy S, MD   75 mg at 10/19/16 0855  . furosemide (LASIX) injection 80 mg  80 mg Intravenous Louis MatteQ8H Hawkins, Edward, MD   80 mg at 10/19/16 1302  . gabapentin (NEURONTIN) capsule 100 mg  100 mg Oral QHS Opyd, Lavone Neriimothy S, MD   100 mg at 10/18/16 2233  . hydrALAZINE (APRESOLINE) injection 10 mg  10 mg Intravenous Q4H PRN Opyd, Lavone Neriimothy S, MD      . hydrALAZINE (APRESOLINE) tablet 100 mg  100 mg Oral TID Briscoe Deutscherpyd, Timothy S, MD   100 mg at 10/19/16 0854  . HYDROcodone-acetaminophen (NORCO/VICODIN) 5-325 MG per tablet 1-2 tablet  1-2 tablet Oral QID PRN Opyd, Lavone Neriimothy S, MD   2 tablet at 10/18/16 2210  . insulin aspart (novoLOG) injection 0-5 Units  0-5 Units Subcutaneous QHS Briscoe Deutscherpyd, Timothy S, MD   2 Units at 10/21/2016 2145  . insulin aspart (novoLOG) injection 0-9 Units  0-9 Units Subcutaneous TID WC Opyd, Lavone Neriimothy S, MD   1 Units at 10/19/16 1138  . isosorbide mononitrate (IMDUR) 24 hr tablet 60 mg  60 mg Oral Daily Opyd, Lavone Neriimothy S, MD   60 mg at 10/19/16 0855  . latanoprost (XALATAN) 0.005 % ophthalmic solution 1 drop  1 drop Both Eyes QHS Opyd, Lavone Neriimothy S, MD   1 drop at 10/18/16 2233  . MEDLINE mouth rinse  15 mL Mouth Rinse BID Opyd, Lavone Neriimothy S, MD   15 mL at 10/19/16 0856  . nitroGLYCERIN (NITROSTAT) SL tablet 0.4 mg  0.4 mg Sublingual Q5 min PRN Opyd, Lavone Neriimothy S, MD      . ondansetron (ZOFRAN) injection 4 mg  4 mg Intravenous Q6H PRN Opyd, Lavone Neriimothy S, MD      . pantoprazole (PROTONIX) EC tablet 40 mg  40 mg Oral Q0600 Briscoe Deutscherpyd, Timothy S, MD   40 mg at 10/19/16 0622  .  primidone (MYSOLINE) tablet 50 mg  50 mg Oral Daily Opyd, Lavone Neriimothy S, MD   50 mg at 10/19/16 0856  . sodium chloride flush (NS) 0.9 % injection 10-40 mL  10-40 mL Intracatheter Q12H Kari BaarsHawkins, Edward, MD   10 mL at 10/19/16 0856  . sodium chloride flush (NS) 0.9 % injection 10-40 mL  10-40 mL Intracatheter PRN Kari BaarsHawkins, Edward, MD      . sodium chloride flush (NS) 0.9 % injection 3 mL  3 mL Intravenous Q12H Opyd, Lavone Neriimothy S, MD   3 mL at 10/19/16 0857  .  sodium chloride flush (NS) 0.9 % injection 3 mL  3 mL Intravenous PRN Opyd, Lavone Neri, MD   10 mL at 10/18/16 1022  . spironolactone (ALDACTONE) tablet 25 mg  25 mg Oral Daily Kari Baars, MD   25 mg at 10/19/16 1301  . umeclidinium bromide (INCRUSE ELLIPTA) 62.5 MCG/INH 1 puff  1 puff Inhalation Daily Opyd, Lavone Neri, MD   1 puff at 10/19/16 0759  . warfarin (COUMADIN) tablet 5 mg  5 mg Oral Once Kari Baars, MD      . Warfarin - Pharmacist Dosing Inpatient   Does not apply Q24H Kari Baars, MD         Discharge Medications: Please see discharge summary for a list of discharge medications.  Relevant Imaging Results:  Relevant Lab Results:   Additional Information SSN:  [id number]  Annice Needy, LCSW

## 2016-10-19 NOTE — Care Management Important Message (Signed)
Important Message  Patient Details  Name: Melissa [name] MRN: [id number] Date of Birth: 1930-03-20   Medicare Important Message Given:  Yes    Mercy Leppla, Chrystine OilerSharley Diane, RN 10/19/2016, 11:58 AM

## 2016-10-19 NOTE — Clinical Social Work Placement (Signed)
   CLINICAL SOCIAL WORK PLACEMENT  NOTE  Date:  10/19/2016  Patient Details  Name: Carley HammedBettie L Albor MRN: [id number] Date of Birth: [birth year]  Clinical Social Work is seeking post-discharge placement for this patient at the Skilled  Nursing Facility level of care (*CSW will initial, date and re-position this form in  chart as items are completed):  Yes   Patient/family provided with [facility] Clinical Social Work Department's list of facilities offering this level of care within the geographic area requested by the patient (or if unable, by the patient's family).  Yes   Patient/family informed of their freedom to choose among providers that offer the needed level of care, that participate in Medicare, Medicaid or managed care program needed by the patient, have an available bed and are willing to accept the patient.  Yes   Patient/family informed of [place]'s ownership interest in [facility] and [facility], as well as of the fact that they are under no obligation to receive care at these facilities.  PASRR submitted to EDS on       PASRR number received on       Existing PASRR number confirmed on 10/19/16     FL2 transmitted to all facilities in geographic area requested by pt/family on 10/19/16     FL2 transmitted to all facilities within larger geographic area on       Patient informed that his/her managed care company has contracts with or will negotiate with certain facilities, including the following:            Patient/family informed of bed offers received.  [facility]     Physician recommends and patient chooses bed at      [facility] to [facility]  .  Patient to be transferred to facility by       Patient family notified on   of transfer.  Name of family member notified:        PHYSICIAN       Additional Comment:    _______________________________________________ Annice NeedySettle, Montez Cuda  D, LCSW 10/19/2016, 4:51 PM

## 2016-10-20 ENCOUNTER — Inpatient Hospital Stay (HOSPITAL_COMMUNITY): Payer: Medicare Other

## 2016-10-20 LAB — CBC
HEMATOCRIT: 30 % — AB (ref 36.0–46.0)
Hemoglobin: 9.4 g/dL — ABNORMAL LOW (ref 12.0–15.0)
MCH: 31.8 pg (ref 26.0–34.0)
MCHC: 31.3 g/dL (ref 30.0–36.0)
MCV: 101.4 fL — ABNORMAL HIGH (ref 78.0–100.0)
Platelets: 117 10*3/uL — ABNORMAL LOW (ref 150–400)
RBC: 2.96 MIL/uL — AB (ref 3.87–5.11)
RDW: 16 % — AB (ref 11.5–15.5)
WBC: 6.6 10*3/uL (ref 4.0–10.5)

## 2016-10-20 LAB — GLUCOSE, CAPILLARY
GLUCOSE-CAPILLARY: 140 mg/dL — AB (ref 65–99)
GLUCOSE-CAPILLARY: 128 mg/dL — AB (ref 65–99)
Glucose-Capillary: 141 mg/dL — ABNORMAL HIGH (ref 65–99)
Glucose-Capillary: 147 mg/dL — ABNORMAL HIGH (ref 65–99)

## 2016-10-20 LAB — BASIC METABOLIC PANEL
ANION GAP: 7 (ref 5–15)
BUN: 68 mg/dL — ABNORMAL HIGH (ref 6–20)
CO2: 32 mmol/L (ref 22–32)
Calcium: 8.9 mg/dL (ref 8.9–10.3)
Chloride: 102 mmol/L (ref 101–111)
Creatinine, Ser: 1.83 mg/dL — ABNORMAL HIGH (ref 0.44–1.00)
GFR calc non Af Amer: 24 mL/min — ABNORMAL LOW (ref >60)
GFR, EST AFRICAN AMERICAN: 28 mL/min — AB (ref >60)
Glucose, Bld: 137 mg/dL — ABNORMAL HIGH (ref 65–99)
POTASSIUM: 4.3 mmol/L (ref 3.5–5.1)
Sodium: 141 mmol/L (ref 135–145)

## 2016-10-20 LAB — PROTIME-INR
INR: 1.81
Prothrombin Time: 20.9 seconds — ABNORMAL HIGH (ref 11.4–15.2)

## 2016-10-20 MED ORDER — MORPHINE SULFATE (PF) 2 MG/ML IV SOLN
2.0000 mg | Freq: Once | INTRAVENOUS | Status: AC
  Administered 2016-10-20: 2 mg via INTRAVENOUS
  Filled 2016-10-20: qty 1

## 2016-10-20 MED ORDER — WARFARIN SODIUM 5 MG PO TABS
5.0000 mg | ORAL_TABLET | Freq: Once | ORAL | Status: AC
  Administered 2016-10-20: 5 mg via ORAL
  Filled 2016-10-20: qty 1

## 2016-10-20 MED ORDER — FUROSEMIDE 10 MG/ML IJ SOLN
8.0000 mg/h | INTRAMUSCULAR | Status: DC
  Administered 2016-10-20 – 2016-10-21 (×2): 8 mg/h via INTRAVENOUS
  Filled 2016-10-20 (×4): qty 25

## 2016-10-20 MED ORDER — MORPHINE SULFATE (PF) 2 MG/ML IV SOLN
2.0000 mg | INTRAVENOUS | Status: DC | PRN
  Administered 2016-10-20 – 2016-10-21 (×5): 2 mg via INTRAVENOUS
  Filled 2016-10-20 (×5): qty 1

## 2016-10-20 MED ORDER — ALBUTEROL SULFATE (2.5 MG/3ML) 0.083% IN NEBU
2.5000 mg | INHALATION_SOLUTION | RESPIRATORY_TRACT | Status: AC
  Administered 2016-10-20: 2.5 mg via RESPIRATORY_TRACT
  Filled 2016-10-20: qty 3

## 2016-10-20 NOTE — Progress Notes (Signed)
Pt became increasingly SOB with labored respirations, grunting and voicing "I can't breathe". Respiratory administered nebulizer tx.  Dr. Lonzo CandyHawkings notified. Orders given to transfer pt to the unit for BIPAP.

## 2016-10-20 NOTE — Progress Notes (Signed)
ANTICOAGULATION CONSULT NOTE - follow up  Pharmacy Consult for Coumadin Indication: atrial fibrillation  No Known Allergies  Patient Measurements: Height:  (162.6 cm) Weight: 242 lb 4.6 oz (109.9 kg) IBW/kg (Calculated) : 54.7 HEPARIN DW (KG): 81  Vital Signs: Temp: 97.7 F (36.5 C) (09/13 0242) Temp Source: Oral (09/13 0242) BP: 137/70 (09/13 0242) Pulse Rate: 81 (09/13 0242)  Labs:  Recent Labs  10/18/16 0553 10/19/16 0654 10/20/16 0538  HGB 9.8* 10.0* 9.4*  HCT 30.6* 31.1* 30.0*  PLT 114* 108* 117*  LABPROT 19.1* 20.4* 20.9*  INR 1.62 1.76 1.81  CREATININE 1.75* 1.73* 1.83*   Estimated Creatinine Clearance: 28.2 mL/min (A) (by C-G formula based on SCr of 1.83 mg/dL (H)).  Medical History: Past Medical History:  Diagnosis Date  . Anemia   . Atherosclerotic heart disease   . Atrial fibrillation (HCC)   . CAD (coronary artery disease)    Multivessel status post high risk PCI/DES to circumflex 11/2015 (poor candidate for CABG)  . CHF (congestive heart failure) (HCC)   . CKD (chronic kidney disease) stage 3, GFR 30-59 ml/min   . DDD (degenerative disc disease), lumbar   . Diabetic neuropathy (HCC)   . Essential hypertension, benign   . High cholesterol   . Klebsiella pneumonia (HCC)   . Mixed hyperlipidemia   . Nonrheumatic tricuspid (valve) insufficiency   . On home O2   . Type 2 diabetes mellitus (HCC)   . Unstable angina (HCC)    Medications:  Prescriptions Prior to Admission  Medication Sig Dispense Refill Last Dose  . acetaminophen (TYLENOL) 325 MG tablet Take 2 tablets (650 mg total) by mouth every 4 (four) hours as needed for mild pain, moderate pain, fever or headache.   unknown  . albuterol (PROVENTIL) (2.5 MG/3ML) 0.083% nebulizer solution Take 3 mLs (2.5 mg total) by nebulization every 6 (six) hours as needed for wheezing or shortness of breath. 75 mL 12 10/13/2016 at Unknown time  . amLODipine (NORVASC) 5 MG tablet Take 1 tablet (5 mg total)  by mouth daily. 30 tablet 11 11/12/16 at Unknown time  . atorvastatin (LIPITOR) 40 MG tablet Take 1 tablet (40 mg total) by mouth daily. 90 tablet 3 10/13/2016 at Unknown time  . bisacodyl (DULCOLAX) 10 MG suppository Place 1 suppository (10 mg total) rectally daily as needed for moderate constipation. 12 suppository 0   . bisacodyl (DULCOLAX) 5 MG EC tablet Take 1 tablet (5 mg total) by mouth daily as needed for moderate constipation. 30 tablet 0   . clopidogrel (PLAVIX) 75 MG tablet Take 1 tablet (75 mg total) by mouth daily. 90 tablet 3 10/13/2016 at 1600  . hydrALAZINE (APRESOLINE) 100 MG tablet Take 1 tablet (100 mg total) by mouth 3 (three) times daily. 270 tablet 3 Nov 12, 2016 at Unknown time  . HYDROcodone-acetaminophen (NORCO/VICODIN) 5-325 MG tablet Take 1 tablet by mouth 4 (four) times daily as needed for moderate pain. 30 tablet 0 November 12, 2016 at Unknown time  . insulin aspart (NOVOLOG) 100 UNIT/ML injection Inject 0-15 Units into the skin 3 (three) times daily with meals. 10 mL 11   . insulin aspart (NOVOLOG) 100 UNIT/ML injection Inject 0-5 Units into the skin at bedtime. 10 mL 11 10/13/2016 at Unknown time  . insulin aspart (NOVOLOG) 100 UNIT/ML injection Inject 4 Units into the skin 3 (three) times daily with meals. 10 mL 11 11-12-16 at Unknown time  . isosorbide mononitrate (IMDUR) 60 MG 24 hr tablet Take 1 tablet (60 mg  total) by mouth daily. 90 tablet 3 10/21/2016 at Unknown time  . nitroGLYCERIN (NITROSTAT) 0.4 MG SL tablet Place 1 tablet (0.4 mg total) under the tongue every 5 (five) minutes as needed for chest pain. 25 tablet 2 unknown  . pantoprazole (PROTONIX) 40 MG tablet Take 1 tablet (40 mg total) by mouth daily at 6 (six) AM. 90 tablet 3 10/10/2016 at Unknown time  . primidone (MYSOLINE) 50 MG tablet Take 1 tablet (50 mg total) by mouth daily.   11/06/2016 at Unknown time  . PROAIR HFA 108 (90 Base) MCG/ACT inhaler Inhale 1-2 puffs into the lungs 4 (four) times daily.    11/03/2016 at Unknown  time  . torsemide (DEMADEX) 20 MG tablet Take 1 tablet (20 mg total) by mouth daily.   10/09/2016 at Unknown time  . TRAVATAN Z 0.004 % SOLN ophthalmic solution Place 1 drop into both eyes at bedtime.    10/13/2016 at Unknown time  . umeclidinium bromide (INCRUSE ELLIPTA) 62.5 MCG/INH AEPB Inhale 1 puff into the lungs daily.   10/13/2016 at Unknown time  . Vitamin D, Ergocalciferol, (DRISDOL) 50000 units CAPS capsule Take 1 capsule (50,000 Units total) by mouth every 7 (seven) days. 30 capsule  Past Week at Unknown time  . warfarin (COUMADIN) 5 MG tablet TAKE 1 TABLET BY MOUTH DAILY EXCEPT 1/2 TABLET ON WEDNESDAYS AND SATURDAYS. 30 tablet 6 10/13/2016 at 1700  . ALPRAZolam (XANAX) 0.25 MG tablet Take 0.25 mg by mouth 3 (three) times daily as needed.   Not Taking at Unknown time  . ferumoxytol 510 mg in sodium chloride 0.9 % 100 mL Inject 510 mg into the vein once a week. (Patient not taking: Reported on 10/16/2016)   Not Taking at Unknown time  . gabapentin (NEURONTIN) 100 MG capsule Take 100 mg by mouth at bedtime. Take 2 tabs at night   Not Taking at Unknown time   Assessment: 83yo female on chronic Coumadin PTA for h/o afib.  INR is subtherapeutic today  Goal of Therapy:  INR 2-3 Monitor platelets by anticoagulation protocol: Yes   Plan:  Repeat Coumadin 5mg  today x 1 CBC, INR daily Monitor for s/sx bleeding complications  Ethridge Sollenberger [place]et 10/20/2016,10:05 AM

## 2016-10-20 NOTE — Care Management Note (Signed)
Case Management Note  Patient Details  Name: Melissa [name] MRN: [id number] Date of Birth: 02-16-1930   If discussed at Long Length of Stay Meetings, dates discussed:  10/20/2016  Additional Comments:  Uriah Philipson, Chrystine OilerSharley Diane, RN 10/20/2016, 12:07 PM

## 2016-10-20 NOTE — Progress Notes (Signed)
Not able to receive accurate BP readings due to patient's restlessness. Will alternate BP cuff sight.  Genelle Balameron D Izabella Marcantel, RN

## 2016-10-20 NOTE — Progress Notes (Signed)
Subjective: She said she was having more trouble with shortness of breath this AM. Her INO I think is inaccurate. Renal function is a little bit worse with changes in her diuretics. Palliative care consultation is noted and appreciated  Objective: Vital signs in last 24 hours: Temp:  [97.7 F (36.5 C)-98.9 F (37.2 C)] 97.7 F (36.5 C) (09/13 0242) Pulse Rate:  [60-81] 81 (09/13 0242) Resp:  [16-20] 20 (09/13 0242) BP: (137-143)/(57-70) 137/70 (09/13 0242) SpO2:  [96 %-100 %] 100 % (09/13 0756) Weight:  [109.9 kg (242 lb 4.6 oz)] 109.9 kg (242 lb 4.6 oz) (09/13 0552) Weight change: 0.05 kg (1.8 oz) Last BM Date: 10/19/16  Intake/Output from previous day: 09/12 0701 - 09/13 0700 In: 480 [P.O.:480] Out: 900 [Urine:900]  PHYSICAL EXAM General appearance: alert and moderate distress Resp: rales bilaterally Cardio: irregularly irregular rhythm GI: soft, non-tender; bowel sounds normal; no masses,  no organomegaly Extremities: Diffuse edema remains but may be slightly better. Mildly confused  Lab Results:  Results for orders placed or performed during the hospital encounter of 11/04/2016 (from the past 48 hour(s))  Glucose, capillary     Status: Abnormal   Collection Time: 10/18/16 11:05 AM  Result Value Ref Range   Glucose-Capillary 137 (H) 65 - 99 mg/dL  Glucose, capillary     Status: Abnormal   Collection Time: 10/18/16  4:12 PM  Result Value Ref Range   Glucose-Capillary 148 (H) 65 - 99 mg/dL  Glucose, capillary     Status: Abnormal   Collection Time: 10/18/16  8:09 PM  Result Value Ref Range   Glucose-Capillary 171 (H) 65 - 99 mg/dL   Comment 1 Notify RN    Comment 2 Document in Chart   Basic metabolic panel     Status: Abnormal   Collection Time: 10/19/16  6:54 AM  Result Value Ref Range   Sodium 141 135 - 145 mmol/L   Potassium 4.4 3.5 - 5.1 mmol/L   Chloride 102 101 - 111 mmol/L   CO2 30 22 - 32 mmol/L   Glucose, Bld 136 (H) 65 - 99 mg/dL   BUN 68 (H) 6 - 20  mg/dL   Creatinine, Ser 1.73 (H) 0.44 - 1.00 mg/dL   Calcium 9.0 8.9 - 10.3 mg/dL   GFR calc non Af Amer 26 (L) >60 mL/min   GFR calc Af Amer 30 (L) >60 mL/min    Comment: (NOTE) The eGFR has been calculated using the CKD EPI equation. This calculation has not been validated in all clinical situations. eGFR's persistently <60 mL/min signify possible Chronic Kidney Disease.    Anion gap 9 5 - 15  Protime-INR     Status: Abnormal   Collection Time: 10/19/16  6:54 AM  Result Value Ref Range   Prothrombin Time 20.4 (H) 11.4 - 15.2 seconds   INR 1.76   CBC     Status: Abnormal   Collection Time: 10/19/16  6:54 AM  Result Value Ref Range   WBC 7.8 4.0 - 10.5 K/uL   RBC 3.09 (L) 3.87 - 5.11 MIL/uL   Hemoglobin 10.0 (L) 12.0 - 15.0 g/dL   HCT 31.1 (L) 36.0 - 46.0 %   MCV 100.6 (H) 78.0 - 100.0 fL   MCH 32.4 26.0 - 34.0 pg   MCHC 32.2 30.0 - 36.0 g/dL   RDW 16.1 (H) 11.5 - 15.5 %   Platelets 108 (L) 150 - 400 K/uL    Comment: CONSISTENT WITH PREVIOUS RESULT  Glucose, capillary  Status: Abnormal   Collection Time: 10/19/16  7:23 AM  Result Value Ref Range   Glucose-Capillary 123 (H) 65 - 99 mg/dL  Glucose, capillary     Status: Abnormal   Collection Time: 10/19/16 11:01 AM  Result Value Ref Range   Glucose-Capillary 130 (H) 65 - 99 mg/dL  Glucose, capillary     Status: Abnormal   Collection Time: 10/19/16  4:29 PM  Result Value Ref Range   Glucose-Capillary 145 (H) 65 - 99 mg/dL   Comment 1 Notify RN    Comment 2 Document in Chart   Glucose, capillary     Status: Abnormal   Collection Time: 10/19/16  8:45 PM  Result Value Ref Range   Glucose-Capillary 180 (H) 65 - 99 mg/dL   Comment 1 Notify RN    Comment 2 Document in Chart   Protime-INR     Status: Abnormal   Collection Time: 10/20/16  5:38 AM  Result Value Ref Range   Prothrombin Time 20.9 (H) 11.4 - 15.2 seconds   INR 1.81   CBC     Status: Abnormal   Collection Time: 10/20/16  5:38 AM  Result Value Ref Range    WBC 6.6 4.0 - 10.5 K/uL   RBC 2.96 (L) 3.87 - 5.11 MIL/uL   Hemoglobin 9.4 (L) 12.0 - 15.0 g/dL   HCT 30.0 (L) 36.0 - 46.0 %   MCV 101.4 (H) 78.0 - 100.0 fL   MCH 31.8 26.0 - 34.0 pg   MCHC 31.3 30.0 - 36.0 g/dL   RDW 16.0 (H) 11.5 - 15.5 %   Platelets 117 (L) 150 - 400 K/uL    Comment: CONSISTENT WITH PREVIOUS RESULT  Basic metabolic panel     Status: Abnormal   Collection Time: 10/20/16  5:38 AM  Result Value Ref Range   Sodium 141 135 - 145 mmol/L   Potassium 4.3 3.5 - 5.1 mmol/L   Chloride 102 101 - 111 mmol/L   CO2 32 22 - 32 mmol/L   Glucose, Bld 137 (H) 65 - 99 mg/dL   BUN 68 (H) 6 - 20 mg/dL   Creatinine, Ser 1.83 (H) 0.44 - 1.00 mg/dL   Calcium 8.9 8.9 - 10.3 mg/dL   GFR calc non Af Amer 24 (L) >60 mL/min   GFR calc Af Amer 28 (L) >60 mL/min    Comment: (NOTE) The eGFR has been calculated using the CKD EPI equation. This calculation has not been validated in all clinical situations. eGFR's persistently <60 mL/min signify possible Chronic Kidney Disease.    Anion gap 7 5 - 15  Glucose, capillary     Status: Abnormal   Collection Time: 10/20/16  7:26 AM  Result Value Ref Range   Glucose-Capillary 141 (H) 65 - 99 mg/dL    ABGS No results for input(s): PHART, PO2ART, TCO2, HCO3 in the last 72 hours.  Invalid input(s): PCO2 CULTURES No results found for this or any previous visit (from the past 240 hour(s)). Studies/Results: No results found.  Medications:  Prior to Admission:  Prescriptions Prior to Admission  Medication Sig Dispense Refill Last Dose  . acetaminophen (TYLENOL) 325 MG tablet Take 2 tablets (650 mg total) by mouth every 4 (four) hours as needed for mild pain, moderate pain, fever or headache.   unknown  . albuterol (PROVENTIL) (2.5 MG/3ML) 0.083% nebulizer solution Take 3 mLs (2.5 mg total) by nebulization every 6 (six) hours as needed for wheezing or shortness of breath. 75 mL 12 10/13/2016  at Unknown time  . amLODipine (NORVASC) 5 MG tablet Take  1 tablet (5 mg total) by mouth daily. 30 tablet 11 11/04/2016 at Unknown time  . atorvastatin (LIPITOR) 40 MG tablet Take 1 tablet (40 mg total) by mouth daily. 90 tablet 3 10/13/2016 at Unknown time  . bisacodyl (DULCOLAX) 10 MG suppository Place 1 suppository (10 mg total) rectally daily as needed for moderate constipation. 12 suppository 0   . bisacodyl (DULCOLAX) 5 MG EC tablet Take 1 tablet (5 mg total) by mouth daily as needed for moderate constipation. 30 tablet 0   . clopidogrel (PLAVIX) 75 MG tablet Take 1 tablet (75 mg total) by mouth daily. 90 tablet 3 10/13/2016 at 1600  . hydrALAZINE (APRESOLINE) 100 MG tablet Take 1 tablet (100 mg total) by mouth 3 (three) times daily. 270 tablet 3 10/08/2016 at Unknown time  . HYDROcodone-acetaminophen (NORCO/VICODIN) 5-325 MG tablet Take 1 tablet by mouth 4 (four) times daily as needed for moderate pain. 30 tablet 0 10/21/2016 at Unknown time  . insulin aspart (NOVOLOG) 100 UNIT/ML injection Inject 0-15 Units into the skin 3 (three) times daily with meals. 10 mL 11   . insulin aspart (NOVOLOG) 100 UNIT/ML injection Inject 0-5 Units into the skin at bedtime. 10 mL 11 10/13/2016 at Unknown time  . insulin aspart (NOVOLOG) 100 UNIT/ML injection Inject 4 Units into the skin 3 (three) times daily with meals. 10 mL 11 10/19/2016 at Unknown time  . isosorbide mononitrate (IMDUR) 60 MG 24 hr tablet Take 1 tablet (60 mg total) by mouth daily. 90 tablet 3 10/20/2016 at Unknown time  . nitroGLYCERIN (NITROSTAT) 0.4 MG SL tablet Place 1 tablet (0.4 mg total) under the tongue every 5 (five) minutes as needed for chest pain. 25 tablet 2 unknown  . pantoprazole (PROTONIX) 40 MG tablet Take 1 tablet (40 mg total) by mouth daily at 6 (six) AM. 90 tablet 3 10/16/2016 at Unknown time  . primidone (MYSOLINE) 50 MG tablet Take 1 tablet (50 mg total) by mouth daily.   11/03/2016 at Unknown time  . PROAIR HFA 108 (90 Base) MCG/ACT inhaler Inhale 1-2 puffs into the lungs 4 (four) times daily.     10/15/2016 at Unknown time  . torsemide (DEMADEX) 20 MG tablet Take 1 tablet (20 mg total) by mouth daily.   10/18/2016 at Unknown time  . TRAVATAN Z 0.004 % SOLN ophthalmic solution Place 1 drop into both eyes at bedtime.    10/13/2016 at Unknown time  . umeclidinium bromide (INCRUSE ELLIPTA) 62.5 MCG/INH AEPB Inhale 1 puff into the lungs daily.   10/13/2016 at Unknown time  . Vitamin D, Ergocalciferol, (DRISDOL) 50000 units CAPS capsule Take 1 capsule (50,000 Units total) by mouth every 7 (seven) days. 30 capsule  Past Week at Unknown time  . warfarin (COUMADIN) 5 MG tablet TAKE 1 TABLET BY MOUTH DAILY EXCEPT 1/2 TABLET ON WEDNESDAYS AND SATURDAYS. 30 tablet 6 10/13/2016 at 1700  . ALPRAZolam (XANAX) 0.25 MG tablet Take 0.25 mg by mouth 3 (three) times daily as needed.   Not Taking at Unknown time  . ferumoxytol 510 mg in sodium chloride 0.9 % 100 mL Inject 510 mg into the vein once a week. (Patient not taking: Reported on 10/26/2016)   Not Taking at Unknown time  . gabapentin (NEURONTIN) 100 MG capsule Take 100 mg by mouth at bedtime. Take 2 tabs at night   Not Taking at Unknown time   Scheduled: . albuterol  2.5 mg Nebulization  TID  . amLODipine  5 mg Oral Daily  . atorvastatin  40 mg Oral q1800  . clopidogrel  75 mg Oral Daily  . furosemide  80 mg Intravenous Q8H  . gabapentin  100 mg Oral QHS  . hydrALAZINE  100 mg Oral TID  . insulin aspart  0-5 Units Subcutaneous QHS  . insulin aspart  0-9 Units Subcutaneous TID WC  . isosorbide mononitrate  60 mg Oral Daily  . latanoprost  1 drop Both Eyes QHS  . mouth rinse  15 mL Mouth Rinse BID  . pantoprazole  40 mg Oral Q0600  . primidone  50 mg Oral Daily  . sodium chloride flush  10-40 mL Intracatheter Q12H  . sodium chloride flush  3 mL Intravenous Q12H  . spironolactone  25 mg Oral Daily  . umeclidinium bromide  1 puff Inhalation Daily  . Warfarin - Pharmacist Dosing Inpatient   Does not apply Q24H   Continuous: . sodium chloride    . sodium  chloride Stopped (10/15/16 1600)   WVP:XTGGYI chloride, acetaminophen, albuterol, ALPRAZolam, bisacodyl, hydrALAZINE, HYDROcodone-acetaminophen, nitroGLYCERIN, ondansetron (ZOFRAN) IV, sodium chloride flush, sodium chloride flush  Assesment: She has acute on chronic diastolic heart failure. She is on large doses of IV Lasix plus spironolactone. Kidney function is a little bit worse today than previously. She has chronic kidney disease. She is complaining of more shortness of breath this morning. She has some confusion. Palliative care consultation has been obtained and we are transitioning more toward making her comfortable but family does want her to be placed in skilled care facility if possible Principal Problem:   Acute on chronic diastolic CHF (congestive heart failure) ([place]) Active Problems:   Essential hypertension, benign   Persistent atrial fibrillation (HCC)   Chronic kidney disease (CKD), stage IV (severe) (HCC)   CAD S/P high risk PCI 12/01/15   Anemia of chronic disease   Diabetes mellitus type 2, controlled ([place])   Thrombocytopenia ([facility])   Palliative care encounter   Goals of care, counseling/discussion    Plan: Continue treatments. Get her a nebulizer treatment now and see if it helps. Continue IV diuresis    LOS: 6 days   Keon Pender L 10/20/2016, 8:56 AM

## 2016-10-20 NOTE — Progress Notes (Signed)
Report called to Misty, RN

## 2016-10-21 DIAGNOSIS — N179 Acute kidney failure, unspecified: Secondary | ICD-10-CM | POA: Diagnosis present

## 2016-10-21 DIAGNOSIS — N189 Chronic kidney disease, unspecified: Secondary | ICD-10-CM | POA: Diagnosis present

## 2016-10-21 LAB — CBC
HEMATOCRIT: 31.1 % — AB (ref 36.0–46.0)
Hemoglobin: 9.8 g/dL — ABNORMAL LOW (ref 12.0–15.0)
MCH: 32 pg (ref 26.0–34.0)
MCHC: 31.5 g/dL (ref 30.0–36.0)
MCV: 101.6 fL — AB (ref 78.0–100.0)
PLATELETS: 124 10*3/uL — AB (ref 150–400)
RBC: 3.06 MIL/uL — ABNORMAL LOW (ref 3.87–5.11)
RDW: 15.8 % — AB (ref 11.5–15.5)
WBC: 8.1 10*3/uL (ref 4.0–10.5)

## 2016-10-21 LAB — GLUCOSE, CAPILLARY
GLUCOSE-CAPILLARY: 119 mg/dL — AB (ref 65–99)
GLUCOSE-CAPILLARY: 98 mg/dL (ref 65–99)
Glucose-Capillary: 119 mg/dL — ABNORMAL HIGH (ref 65–99)
Glucose-Capillary: 132 mg/dL — ABNORMAL HIGH (ref 65–99)

## 2016-10-21 LAB — BASIC METABOLIC PANEL
Anion gap: 9 (ref 5–15)
BUN: 70 mg/dL — AB (ref 6–20)
CALCIUM: 9 mg/dL (ref 8.9–10.3)
CO2: 30 mmol/L (ref 22–32)
CREATININE: 1.9 mg/dL — AB (ref 0.44–1.00)
Chloride: 101 mmol/L (ref 101–111)
GFR calc Af Amer: 27 mL/min — ABNORMAL LOW (ref >60)
GFR, EST NON AFRICAN AMERICAN: 23 mL/min — AB (ref >60)
GLUCOSE: 134 mg/dL — AB (ref 65–99)
Potassium: 4.7 mmol/L (ref 3.5–5.1)
Sodium: 140 mmol/L (ref 135–145)

## 2016-10-21 LAB — PROTIME-INR
INR: 1.87
Prothrombin Time: 21.3 seconds — ABNORMAL HIGH (ref 11.4–15.2)

## 2016-10-21 LAB — MRSA PCR SCREENING: MRSA BY PCR: NEGATIVE

## 2016-10-21 MED ORDER — LORAZEPAM 2 MG/ML IJ SOLN
1.0000 mg | Freq: Once | INTRAMUSCULAR | Status: AC
  Administered 2016-10-21: 1 mg via INTRAVENOUS
  Filled 2016-10-21: qty 1

## 2016-10-21 MED ORDER — WARFARIN SODIUM 5 MG PO TABS: 5.0000 mg | ORAL_TABLET | Freq: Once | ORAL | Status: DC

## 2016-10-21 MED ORDER — GABAPENTIN 250 MG/5ML PO SOLN
100.0000 mg | Freq: Three times a day (TID) | ORAL | Status: DC
  Administered 2016-10-21: 100 mg via ORAL
  Filled 2016-10-21 (×11): qty 2

## 2016-10-21 MED ORDER — MORPHINE SULFATE (PF) 2 MG/ML IV SOLN
2.0000 mg | INTRAVENOUS | Status: DC | PRN
  Administered 2016-10-21 – 2016-10-22 (×8): 2 mg via INTRAVENOUS
  Filled 2016-10-21 (×8): qty 1

## 2016-10-21 NOTE — Progress Notes (Signed)
PT Cancellation Note  Patient Details Name: TEILA SKALSKY MRN: [id number] DOB: 06-07-1930   Cancelled Treatment:    Reason Eval/Treat Not Completed: Medical issues which prohibited therapy.  Physical therapy held per RN request secondary patient not medically stable at this time.     2:51 PM, 10/21/16 Ocie Bob, MPT Physical Therapist with [facility] [phone] office [phone] mobile phone

## 2016-10-21 NOTE — Progress Notes (Signed)
Daily Progress Note   Patient Name: Melissa [name]       Date: 10/21/2016 DOB: [birth year]  Age: 83 y.o. MRN#: [id number] Attending Physician: Kari Baars, MD Primary Care Physician: Kari Baars, MD Admit Date: 10/24/2016  Reason for Consultation/Follow-up: Establishing goals of care and Psychosocial/spiritual support  Subjective: Melissa [name] is resting quietly in bed. When I call her name and touch her arm she will open her eyes, but not make eye contact. When she is wake she has extrapyramidal movement of arms and legs. Today, she is unable to speak to me. There is no family at bedside at this time.   Since our last meeting, she has decompensated, and was transferred to the intensive care. She is now on Lasix continuous infusion, but still appears to be extremely volume overloaded. Conference with nursing staff and respiratory therapist related to her care. Nursing staff states that Melissa [name] declined to take pills this morning, will but would take liquid medicines. Respiratory therapy states that breathing treatments are uncomfortable, and she tries to remove the mask. Granddaughter, Melissa [name], who is a Social research officer, government request that we do not force Melissa [name] to take breathing treatments. I agree.   Call to daughter, Melissa [name], voicemail left describing my concerns. I returned later in the day, still no family at bedside, no return call.   Length of Stay: 7  Current Medications: Scheduled Meds:  . albuterol  2.5 mg Nebulization TID  . amLODipine  5 mg Oral Daily  . atorvastatin  40 mg Oral q1800  . clopidogrel  75 mg Oral Daily  . gabapentin  100 mg Oral Q8H  . hydrALAZINE  100 mg Oral TID  . insulin aspart  0-5 Units Subcutaneous QHS  . insulin aspart   0-9 Units Subcutaneous TID WC  . isosorbide mononitrate  60 mg Oral Daily  . latanoprost  1 drop Both Eyes QHS  . mouth rinse  15 mL Mouth Rinse BID  . pantoprazole  40 mg Oral Q0600  . primidone  50 mg Oral Daily  . sodium chloride flush  10-40 mL Intracatheter Q12H  . sodium chloride flush  3 mL Intravenous Q12H  . spironolactone  25 mg Oral Daily  . umeclidinium bromide  1 puff Inhalation Daily  . warfarin  5 mg Oral Once  .  Warfarin - Pharmacist Dosing Inpatient   Does not apply Q24H    Continuous Infusions: . sodium chloride    . sodium chloride Stopped (10/15/16 1600)  . furosemide (LASIX) infusion 8 mg/hr (10/21/16 0800)    PRN Meds: sodium chloride, acetaminophen, albuterol, ALPRAZolam, bisacodyl, hydrALAZINE, HYDROcodone-acetaminophen, morphine injection, nitroGLYCERIN, ondansetron (ZOFRAN) IV, sodium chloride flush, sodium chloride flush  Physical Exam  Constitutional: She appears distressed.  Appears worsening, nonverbal at this time.  HENT:  Head: Atraumatic.  Cardiovascular: Normal rate.   Pulmonary/Chest: Effort normal. No respiratory distress.  Abdominal: Soft.  Obese abdomen  Musculoskeletal: She exhibits edema.  Neurological:  Opens eyes when I call her name in touch her, but does not make eye contact or try to respond.  Skin: Skin is warm and dry.  Severe swelling  Vitals reviewed.           Vital Signs: BP (!) 138/126   Pulse 82   Temp 98.9 F (37.2 C) (Oral)   Resp 20   Ht  (1.626 m)   Wt 109 kg (240 lb 4.8 oz)   SpO2 94%   BMI 41.25 kg/m  SpO2: SpO2: 94 % O2 Device: O2 Device: Nasal Cannula O2 Flow Rate: O2 Flow Rate (L/min): 6 L/min  Intake/output summary:  Intake/Output Summary (Last 24 hours) at 10/21/16 1427 Last data filed at 10/21/16 0800  Gross per 24 hour  Intake           400.47 ml  Output              500 ml  Net           -99.53 ml   LBM: Last BM Date: 10/19/16 Baseline Weight: Weight: 108.9 kg (240 lb) Most recent  weight: Weight: 109 kg (240 lb 4.8 oz)       Palliative Assessment/Data:    Flowsheet Rows     Most Recent Value  Intake Tab  Referral Department  Hospitalist  Unit at Time of Referral  Cardiac/Telemetry Unit  Palliative Care Primary Diagnosis  Cardiac  Date Notified  10/17/16  Palliative Care Type  New Palliative care  Reason for referral  Clarify Goals of Care  Date of Admission  11/06/2016  Date first seen by Palliative Care  10/17/16  # of days Palliative referral response time  0 Day(s)  # of days IP prior to Palliative referral  3  Clinical Assessment  Palliative Performance Scale Score  40%  Pain Max last 24 hours  Not able to report  Pain Min Last 24 hours  Not able to report  Dyspnea Max Last 24 Hours  Not able to report  Dyspnea Min Last 24 hours  Not able to report  Psychosocial & Spiritual Assessment  Palliative Care Outcomes  Patient/Family meeting held?  Yes  Who was at the meeting?  Patient at bedside, no family present at this time  Patient/Family wishes: Interventions discontinued/not started   Mechanical Ventilation      Patient Active Problem List   Diagnosis Date Noted  . Acute on chronic renal failure (HCC) 10/21/2016  . Palliative care encounter   . Goals of care, counseling/discussion   . Thrombocytopenia (HCC) November 06, 2016  . UTI (urinary tract infection) 09/02/2016  . Pressure injury of skin 08/31/2016  . Acute on chronic diastolic (congestive) heart failure (HCC) 08/19/2016  . Hand fracture, right 07/21/2016  . Diabetes mellitus type 2, controlled (HCC) 07/20/2016  . Volume depletion 07/20/2016  . Myoclonic jerking 07/20/2016  .  Hyperosmolarity syndrome 07/20/2016  . Status post coronary artery stent placement   . Anemia of chronic disease 12/03/2015  . Bradycardia 12/01/2015  . CAD S/P high risk PCI 12/01/15 12/01/2015  . NSTEMI (non-ST elevated myocardial infarction) (HCC)   . Pulmonary hypertension (HCC)   . Chronic kidney disease (CKD),  stage IV (severe) (HCC) 11/24/2015  . Unstable angina (HCC) 11/22/2015  . Chest pain 11/10/2015  . Angina at rest [facility]) 11/10/2015  . Acute CHF (congestive heart failure) (HCC) 11/10/2015  . Chronic venous insufficiency 02/19/2015  . Acute on chronic diastolic CHF (congestive heart failure) (HCC) 02/17/2015  . Tricuspid valve regurgitation 02/17/2015  . Encounter for therapeutic drug monitoring 03/11/2013  . Long term current use of anticoagulant 05/05/2010  . Mixed hyperlipidemia 09/30/2009  . Essential hypertension, benign 09/24/2008  . Persistent atrial fibrillation (HCC) 09/24/2008    Palliative Care Assessment & Plan   Patient Profile: 83 y.o.femalewith past medical history of Atrial fibrillation, coronary artery disease, CHF, chronic kidney disease stage III, diabetes with neuropathy, high blood pressure and cholesterol, history of Klebsiella pneumonia,admitted on 09/15/2018with acute on chronic heart failure stage IV, worsening CKD.   Assessment: CHF, acute on chronic; medications maximized, we discussed the difficulty in keeping the hard drive and the kidneys wet. Family is aware that Mrs. Justin Mend is very frail, and her heart is weak.  She looks worse today 9/14 frailty, functional decline; 3 hospitalizations in the last 6 months, with worsening functional status and mental status after each. Family is requesting rehab, and likely residential placement. I question whether she will be able to participate in rehab, comfort care is recommended.  Recommendations/Plan:  Mrs. Borjon seems to have worsened, she is unable to make even her basic needs known to me at this time.  Comfort care recommended  Goals of Care and Additional Recommendations:  Limitations on Scope of Treatment: At this point continue to treat the treatable but no CPR or intubation.  Code Status:    Code Status Orders        Start     Ordered   10/08/2016 1523  Do not attempt resuscitation (DNR)   Continuous    Question Answer Comment  In the event of cardiac or respiratory ARREST Do not call a "code blue"   In the event of cardiac or respiratory ARREST Do not perform Intubation, CPR, defibrillation or ACLS   In the event of cardiac or respiratory ARREST Use medication by any route, position, wound care, and other measures to relive pain and suffering. May use oxygen, suction and manual treatment of airway obstruction as needed for comfort.      10/14/16 1524    Code Status History    Date Active Date Inactive Code Status Order ID Comments User Context   09/01/2016  8:05 AM 09/02/2016  5:59 PM DNR [id number]  Kari Baars, MD Inpatient   08/19/2016  2:15 AM 09/01/2016  8:05 AM Full Code [id number]  Delano Metz, MD ED   07/21/2016  1:02 AM 07/26/2016 12:17 PM Full Code [id number]  Houston Siren, MD Inpatient   11/23/2015 12:32 AM 12/05/2015  5:44 PM Full Code [id number]  Houston Siren, MD Inpatient   11/10/2015  4:11 PM 11/12/2015  7:46 PM Full Code [id number]  Rhetta Mura, MD Inpatient   02/17/2015  8:59 PM 02/25/2015  6:21 PM Full Code [id number]  Houston Siren, MD ED    Advance Directive Documentation     Most Recent Value  Type of Advance Directive  Out of facility DNR (pink MOST or yellow form), Living will, Healthcare Power of Attorney  Pre-existing out of facility DNR order (yellow form or pink MOST form)  -  "MOST" Form in Place?  -       Prognosis:  < 4 weeks, or less would not be surprising based on worsening health condition, frailty, fluid volume overload, Mrs. Connelly's desire to not take medications or breathing treatments.   Discharge Planning:  Based on outcomes, family desire. In-hospital death would not be surprising.  Care plan was discussed with nursing staff, case manager, social worker, and Dr. Juanetta Gosling on next rounds.  Thank you for allowing the Palliative Medicine Team to assist in the care of this patient.   Time In: 1205  Time Out: 1230  Total Time 25 minutes   Prolonged Time Billed  no       Greater than 50%  of this time was spent counseling and coordinating care related to the above assessment and plan.  Katheran Awe, NP  Please contact Palliative Medicine Team phone at [phone] for questions and concerns.

## 2016-10-21 NOTE — Plan of Care (Signed)
Problem: Skin Integrity: Goal: Risk for impaired skin integrity will decrease Outcome: Progressing Patient being turned Q2. Bed on rotation as well. Extremities elevated on pillows

## 2016-10-21 NOTE — Progress Notes (Signed)
Subjective: She had a fairly quiet night. She's been getting morphine about every 2 hours but it seems like it may not last quite 2 hours. She does get comfortable with the morphine but otherwise she is not comfortable. She's having a lot of almost choreoathetoid movements.  Objective: Vital signs in last 24 hours: Temp:  [97.5 F (36.4 C)-99.7 F (37.6 C)] 99.7 F (37.6 C) (09/14 0400) Pulse Rate:  [68-87] 79 (09/14 0600) Resp:  [0-27] 0 (09/14 0600) BP: (80-210)/(48-166) 133/61 (09/14 0600) SpO2:  [88 %-100 %] 96 % (09/14 0600) Weight:  [109 kg (240 lb 4.8 oz)] 109 kg (240 lb 4.8 oz) (09/14 0500) Weight change: -0.9 kg (-1 lb 15.8 oz) Last BM Date: 10/19/16  Intake/Output from previous day: 09/13 0701 - 09/14 0700 In: 384.5 [P.O.:250; I.V.:134.5] Out: 500 [Urine:500]  PHYSICAL EXAM General appearance: Sleepy but arousable Resp: rales bilaterally Cardio: irregularly irregular rhythm GI: soft, non-tender; bowel sounds normal; no masses,  no organomegaly Extremities: Still significant edema in multiple areas Very hard of hearing  Lab Results:  Results for orders placed or performed during the hospital encounter of 10/26/2016 (from the past 48 hour(s))  Glucose, capillary     Status: Abnormal   Collection Time: 10/19/16  7:23 AM  Result Value Ref Range   Glucose-Capillary 123 (H) 65 - 99 mg/dL  Glucose, capillary     Status: Abnormal   Collection Time: 10/19/16 11:01 AM  Result Value Ref Range   Glucose-Capillary 130 (H) 65 - 99 mg/dL  Glucose, capillary     Status: Abnormal   Collection Time: 10/19/16  4:29 PM  Result Value Ref Range   Glucose-Capillary 145 (H) 65 - 99 mg/dL   Comment 1 Notify RN    Comment 2 Document in Chart   Glucose, capillary     Status: Abnormal   Collection Time: 10/19/16  8:45 PM  Result Value Ref Range   Glucose-Capillary 180 (H) 65 - 99 mg/dL   Comment 1 Notify RN    Comment 2 Document in Chart   Protime-INR     Status: Abnormal    Collection Time: 10/20/16  5:38 AM  Result Value Ref Range   Prothrombin Time 20.9 (H) 11.4 - 15.2 seconds   INR 1.81   CBC     Status: Abnormal   Collection Time: 10/20/16  5:38 AM  Result Value Ref Range   WBC 6.6 4.0 - 10.5 K/uL   RBC 2.96 (L) 3.87 - 5.11 MIL/uL   Hemoglobin 9.4 (L) 12.0 - 15.0 g/dL   HCT 30.0 (L) 36.0 - 46.0 %   MCV 101.4 (H) 78.0 - 100.0 fL   MCH 31.8 26.0 - 34.0 pg   MCHC 31.3 30.0 - 36.0 g/dL   RDW 16.0 (H) 11.5 - 15.5 %   Platelets 117 (L) 150 - 400 K/uL    Comment: CONSISTENT WITH PREVIOUS RESULT  Basic metabolic panel     Status: Abnormal   Collection Time: 10/20/16  5:38 AM  Result Value Ref Range   Sodium 141 135 - 145 mmol/L   Potassium 4.3 3.5 - 5.1 mmol/L   Chloride 102 101 - 111 mmol/L   CO2 32 22 - 32 mmol/L   Glucose, Bld 137 (H) 65 - 99 mg/dL   BUN 68 (H) 6 - 20 mg/dL   Creatinine, Ser 1.83 (H) 0.44 - 1.00 mg/dL   Calcium 8.9 8.9 - 10.3 mg/dL   GFR calc non Af Amer 24 (L) >60 mL/min  GFR calc Af Amer 28 (L) >60 mL/min    Comment: (NOTE) The eGFR has been calculated using the CKD EPI equation. This calculation has not been validated in all clinical situations. eGFR's persistently <60 mL/min signify possible Chronic Kidney Disease.    Anion gap 7 5 - 15  Glucose, capillary     Status: Abnormal   Collection Time: 10/20/16  7:26 AM  Result Value Ref Range   Glucose-Capillary 141 (H) 65 - 99 mg/dL  Glucose, capillary     Status: Abnormal   Collection Time: 10/20/16 11:03 AM  Result Value Ref Range   Glucose-Capillary 140 (H) 65 - 99 mg/dL  MRSA PCR Screening     Status: None   Collection Time: 10/20/16 12:35 PM  Result Value Ref Range   MRSA by PCR NEGATIVE NEGATIVE    Comment:        The GeneXpert MRSA Assay (FDA approved for NASAL specimens only), is one component of a comprehensive MRSA colonization surveillance program. It is not intended to diagnose MRSA infection nor to guide or monitor treatment for MRSA infections.    Glucose, capillary     Status: Abnormal   Collection Time: 10/20/16  4:22 PM  Result Value Ref Range   Glucose-Capillary 128 (H) 65 - 99 mg/dL  Glucose, capillary     Status: Abnormal   Collection Time: 10/20/16  9:48 PM  Result Value Ref Range   Glucose-Capillary 147 (H) 65 - 99 mg/dL  Protime-INR     Status: Abnormal   Collection Time: 10/21/16  5:38 AM  Result Value Ref Range   Prothrombin Time 21.3 (H) 11.4 - 15.2 seconds   INR 1.87   CBC     Status: Abnormal   Collection Time: 10/21/16  5:38 AM  Result Value Ref Range   WBC 8.1 4.0 - 10.5 K/uL   RBC 3.06 (L) 3.87 - 5.11 MIL/uL   Hemoglobin 9.8 (L) 12.0 - 15.0 g/dL   HCT 31.1 (L) 36.0 - 46.0 %   MCV 101.6 (H) 78.0 - 100.0 fL   MCH 32.0 26.0 - 34.0 pg   MCHC 31.5 30.0 - 36.0 g/dL   RDW 15.8 (H) 11.5 - 15.5 %   Platelets 124 (L) 150 - 400 K/uL  Basic metabolic panel     Status: Abnormal   Collection Time: 10/21/16  5:38 AM  Result Value Ref Range   Sodium 140 135 - 145 mmol/L   Potassium 4.7 3.5 - 5.1 mmol/L   Chloride 101 101 - 111 mmol/L   CO2 30 22 - 32 mmol/L   Glucose, Bld 134 (H) 65 - 99 mg/dL   BUN 70 (H) 6 - 20 mg/dL   Creatinine, Ser 1.90 (H) 0.44 - 1.00 mg/dL   Calcium 9.0 8.9 - 10.3 mg/dL   GFR calc non Af Amer 23 (L) >60 mL/min   GFR calc Af Amer 27 (L) >60 mL/min    Comment: (NOTE) The eGFR has been calculated using the CKD EPI equation. This calculation has not been validated in all clinical situations. eGFR's persistently <60 mL/min signify possible Chronic Kidney Disease.    Anion gap 9 5 - 15    ABGS No results for input(s): PHART, PO2ART, TCO2, HCO3 in the last 72 hours.  Invalid input(s): PCO2 CULTURES Recent Results (from the past 240 hour(s))  MRSA PCR Screening     Status: None   Collection Time: 10/20/16 12:35 PM  Result Value Ref Range Status   MRSA by  PCR NEGATIVE NEGATIVE Final    Comment:        The GeneXpert MRSA Assay (FDA approved for NASAL specimens only), is one component  of a comprehensive MRSA colonization surveillance program. It is not intended to diagnose MRSA infection nor to guide or monitor treatment for MRSA infections.    Studies/Results: Dg Chest 1 View  Result Date: 10/20/2016 CLINICAL DATA:  Shortness of breath, hypertension, atrial fibrillation EXAM: CHEST 1 VIEW COMPARISON:  Chest x-ray of 10/15/2016 FINDINGS: There is persistent and possibly worsened CHF with moderate cardiomegaly, edema, and probable bilateral effusions. Right central venous line tip overlies the mid SVC. No pneumothorax is seen. IMPRESSION: Little change to slight worsening of CHF with cardiomegaly and small effusions. Electronically Signed   By: Ivar Drape M.D.   On: 10/20/2016 12:02    Medications:  Prior to Admission:  Prescriptions Prior to Admission  Medication Sig Dispense Refill Last Dose  . acetaminophen (TYLENOL) 325 MG tablet Take 2 tablets (650 mg total) by mouth every 4 (four) hours as needed for mild pain, moderate pain, fever or headache.   unknown  . albuterol (PROVENTIL) (2.5 MG/3ML) 0.083% nebulizer solution Take 3 mLs (2.5 mg total) by nebulization every 6 (six) hours as needed for wheezing or shortness of breath. 75 mL 12 10/13/2016 at Unknown time  . amLODipine (NORVASC) 5 MG tablet Take 1 tablet (5 mg total) by mouth daily. 30 tablet 11 10/15/2016 at Unknown time  . atorvastatin (LIPITOR) 40 MG tablet Take 1 tablet (40 mg total) by mouth daily. 90 tablet 3 10/13/2016 at Unknown time  . bisacodyl (DULCOLAX) 10 MG suppository Place 1 suppository (10 mg total) rectally daily as needed for moderate constipation. 12 suppository 0   . bisacodyl (DULCOLAX) 5 MG EC tablet Take 1 tablet (5 mg total) by mouth daily as needed for moderate constipation. 30 tablet 0   . clopidogrel (PLAVIX) 75 MG tablet Take 1 tablet (75 mg total) by mouth daily. 90 tablet 3 10/13/2016 at 1600  . hydrALAZINE (APRESOLINE) 100 MG tablet Take 1 tablet (100 mg total) by mouth 3 (three) times  daily. 270 tablet 3 10/14/2016 at Unknown time  . HYDROcodone-acetaminophen (NORCO/VICODIN) 5-325 MG tablet Take 1 tablet by mouth 4 (four) times daily as needed for moderate pain. 30 tablet 0 11/05/2016 at Unknown time  . insulin aspart (NOVOLOG) 100 UNIT/ML injection Inject 0-15 Units into the skin 3 (three) times daily with meals. 10 mL 11   . insulin aspart (NOVOLOG) 100 UNIT/ML injection Inject 0-5 Units into the skin at bedtime. 10 mL 11 10/13/2016 at Unknown time  . insulin aspart (NOVOLOG) 100 UNIT/ML injection Inject 4 Units into the skin 3 (three) times daily with meals. 10 mL 11 10/13/2016 at Unknown time  . isosorbide mononitrate (IMDUR) 60 MG 24 hr tablet Take 1 tablet (60 mg total) by mouth daily. 90 tablet 3 10/30/2016 at Unknown time  . nitroGLYCERIN (NITROSTAT) 0.4 MG SL tablet Place 1 tablet (0.4 mg total) under the tongue every 5 (five) minutes as needed for chest pain. 25 tablet 2 unknown  . pantoprazole (PROTONIX) 40 MG tablet Take 1 tablet (40 mg total) by mouth daily at 6 (six) AM. 90 tablet 3 10/15/2016 at Unknown time  . primidone (MYSOLINE) 50 MG tablet Take 1 tablet (50 mg total) by mouth daily.   10/17/2016 at Unknown time  . PROAIR HFA 108 (90 Base) MCG/ACT inhaler Inhale 1-2 puffs into the lungs 4 (four) times daily.  11/06/2016 at Unknown time  . torsemide (DEMADEX) 20 MG tablet Take 1 tablet (20 mg total) by mouth daily.   11/05/2016 at Unknown time  . TRAVATAN Z 0.004 % SOLN ophthalmic solution Place 1 drop into both eyes at bedtime.    10/13/2016 at Unknown time  . umeclidinium bromide (INCRUSE ELLIPTA) 62.5 MCG/INH AEPB Inhale 1 puff into the lungs daily.   10/13/2016 at Unknown time  . Vitamin D, Ergocalciferol, (DRISDOL) 50000 units CAPS capsule Take 1 capsule (50,000 Units total) by mouth every 7 (seven) days. 30 capsule  Past Week at Unknown time  . warfarin (COUMADIN) 5 MG tablet TAKE 1 TABLET BY MOUTH DAILY EXCEPT 1/2 TABLET ON WEDNESDAYS AND SATURDAYS. 30 tablet 6 10/13/2016 at 1700   . ALPRAZolam (XANAX) 0.25 MG tablet Take 0.25 mg by mouth 3 (three) times daily as needed.   Not Taking at Unknown time  . ferumoxytol 510 mg in sodium chloride 0.9 % 100 mL Inject 510 mg into the vein once a week. (Patient not taking: Reported on 10/27/2016)   Not Taking at Unknown time  . gabapentin (NEURONTIN) 100 MG capsule Take 100 mg by mouth at bedtime. Take 2 tabs at night   Not Taking at Unknown time   Scheduled: . albuterol  2.5 mg Nebulization TID  . amLODipine  5 mg Oral Daily  . atorvastatin  40 mg Oral q1800  . clopidogrel  75 mg Oral Daily  . gabapentin  100 mg Oral Q8H  . hydrALAZINE  100 mg Oral TID  . insulin aspart  0-5 Units Subcutaneous QHS  . insulin aspart  0-9 Units Subcutaneous TID WC  . isosorbide mononitrate  60 mg Oral Daily  . latanoprost  1 drop Both Eyes QHS  . mouth rinse  15 mL Mouth Rinse BID  . pantoprazole  40 mg Oral Q0600  . primidone  50 mg Oral Daily  . sodium chloride flush  10-40 mL Intracatheter Q12H  . sodium chloride flush  3 mL Intravenous Q12H  . spironolactone  25 mg Oral Daily  . umeclidinium bromide  1 puff Inhalation Daily  . Warfarin - Pharmacist Dosing Inpatient   Does not apply Q24H   Continuous: . sodium chloride    . sodium chloride Stopped (10/15/16 1600)  . furosemide (LASIX) infusion 8 mg/hr (10/20/16 1500)   LNL:GXQJJH chloride, acetaminophen, albuterol, ALPRAZolam, bisacodyl, hydrALAZINE, HYDROcodone-acetaminophen, morphine injection, nitroGLYCERIN, ondansetron (ZOFRAN) IV, sodium chloride flush, sodium chloride flush  Assesment: She was admitted with acute on chronic diastolic heart failure. She has had increasing pulmonary edema despite large doses of intravenous Lasix. She got much worse yesterday. I transferred her to stepdown with the thought that she might need BiPAP particularly make her comfortable but she did not ever use the BiPAP. She has maintained her oxygenation on nasal oxygen so far. She is comfortable when  she gets morphine. She is on IV Lasix drip now. Still not much urine output. She has Foley catheter and PICC line in place. Principal Problem:   Acute on chronic diastolic CHF (congestive heart failure) (HCC) Active Problems:   Essential hypertension, benign   Persistent atrial fibrillation (HCC)   Chronic kidney disease (CKD), stage IV (severe) (HCC)   CAD S/P high risk PCI 12/01/15   Anemia of chronic disease   Diabetes mellitus type 2, controlled ([place])   Thrombocytopenia ([place])   Palliative care encounter   Goals of care, counseling/discussion    Plan: Continue treatments. Discussed with granddaughter at bedside.  I'm going to increase the morphine to every hour as needed. Continue Lasix continuous infusion for now. I think if she doesn't clearly start making more urine and have improvement over the next 24-48 hours we will need to transition to purely comfort care. I am hopeful of making her more comfortable with diuresis if possible    LOS: 7 days   Cheyan Frees L 10/21/2016, 7:08 AM

## 2016-10-21 NOTE — Progress Notes (Signed)
ANTICOAGULATION CONSULT NOTE - follow up  Pharmacy Consult for Coumadin Indication: atrial fibrillation  No Known Allergies  Patient Measurements: Height:  (162.6 cm) Weight: 240 lb 4.8 oz (109 kg) IBW/kg (Calculated) : 54.7 HEPARIN DW (KG): 81  Vital Signs: Temp: 99.7 F (37.6 C) (09/14 0400) Temp Source: Axillary (09/14 0400) BP: 157/72 (09/14 0800) Pulse Rate: 77 (09/14 0800)  Labs:  Recent Labs  10/19/16 0654 10/20/16 0538 10/21/16 0538  HGB 10.0* 9.4* 9.8*  HCT 31.1* 30.0* 31.1*  PLT 108* 117* 124*  LABPROT 20.4* 20.9* 21.3*  INR 1.76 1.81 1.87  CREATININE 1.73* 1.83* 1.90*   Estimated Creatinine Clearance: 27.1 mL/min (A) (by C-G formula based on SCr of 1.9 mg/dL (H)).  Medical History: Past Medical History:  Diagnosis Date  . Anemia   . Atherosclerotic heart disease   . Atrial fibrillation (HCC)   . CAD (coronary artery disease)    Multivessel status post high risk PCI/DES to circumflex 11/2015 (poor candidate for CABG)  . CHF (congestive heart failure) (HCC)   . CKD (chronic kidney disease) stage 3, GFR 30-59 ml/min   . DDD (degenerative disc disease), lumbar   . Diabetic neuropathy (HCC)   . Essential hypertension, benign   . High cholesterol   . Klebsiella pneumonia (HCC)   . Mixed hyperlipidemia   . Nonrheumatic tricuspid (valve) insufficiency   . On home O2   . Type 2 diabetes mellitus (HCC)   . Unstable angina (HCC)    Medications:  Prescriptions Prior to Admission  Medication Sig Dispense Refill Last Dose  . acetaminophen (TYLENOL) 325 MG tablet Take 2 tablets (650 mg total) by mouth every 4 (four) hours as needed for mild pain, moderate pain, fever or headache.   unknown  . albuterol (PROVENTIL) (2.5 MG/3ML) 0.083% nebulizer solution Take 3 mLs (2.5 mg total) by nebulization every 6 (six) hours as needed for wheezing or shortness of breath. 75 mL 12 10/13/2016 at Unknown time  . amLODipine (NORVASC) 5 MG tablet Take 1 tablet (5 mg total)  by mouth daily. 30 tablet 11 11/09/2016 at Unknown time  . atorvastatin (LIPITOR) 40 MG tablet Take 1 tablet (40 mg total) by mouth daily. 90 tablet 3 10/13/2016 at Unknown time  . bisacodyl (DULCOLAX) 10 MG suppository Place 1 suppository (10 mg total) rectally daily as needed for moderate constipation. 12 suppository 0   . bisacodyl (DULCOLAX) 5 MG EC tablet Take 1 tablet (5 mg total) by mouth daily as needed for moderate constipation. 30 tablet 0   . clopidogrel (PLAVIX) 75 MG tablet Take 1 tablet (75 mg total) by mouth daily. 90 tablet 3 10/13/2016 at 1600  . hydrALAZINE (APRESOLINE) 100 MG tablet Take 1 tablet (100 mg total) by mouth 3 (three) times daily. 270 tablet 3 11/09/2016 at Unknown time  . HYDROcodone-acetaminophen (NORCO/VICODIN) 5-325 MG tablet Take 1 tablet by mouth 4 (four) times daily as needed for moderate pain. 30 tablet 0 11-09-16 at Unknown time  . insulin aspart (NOVOLOG) 100 UNIT/ML injection Inject 0-15 Units into the skin 3 (three) times daily with meals. 10 mL 11   . insulin aspart (NOVOLOG) 100 UNIT/ML injection Inject 0-5 Units into the skin at bedtime. 10 mL 11 10/13/2016 at Unknown time  . insulin aspart (NOVOLOG) 100 UNIT/ML injection Inject 4 Units into the skin 3 (three) times daily with meals. 10 mL 11 2016/11/09 at Unknown time  . isosorbide mononitrate (IMDUR) 60 MG 24 hr tablet Take 1 tablet (60 mg  total) by mouth daily. 90 tablet 3 10/22/2016 at Unknown time  . nitroGLYCERIN (NITROSTAT) 0.4 MG SL tablet Place 1 tablet (0.4 mg total) under the tongue every 5 (five) minutes as needed for chest pain. 25 tablet 2 unknown  . pantoprazole (PROTONIX) 40 MG tablet Take 1 tablet (40 mg total) by mouth daily at 6 (six) AM. 90 tablet 3 10/28/2016 at Unknown time  . primidone (MYSOLINE) 50 MG tablet Take 1 tablet (50 mg total) by mouth daily.   11/04/2016 at Unknown time  . PROAIR HFA 108 (90 Base) MCG/ACT inhaler Inhale 1-2 puffs into the lungs 4 (four) times daily.    10/11/2016 at Unknown  time  . torsemide (DEMADEX) 20 MG tablet Take 1 tablet (20 mg total) by mouth daily.   10/16/2016 at Unknown time  . TRAVATAN Z 0.004 % SOLN ophthalmic solution Place 1 drop into both eyes at bedtime.    10/13/2016 at Unknown time  . umeclidinium bromide (INCRUSE ELLIPTA) 62.5 MCG/INH AEPB Inhale 1 puff into the lungs daily.   10/13/2016 at Unknown time  . Vitamin D, Ergocalciferol, (DRISDOL) 50000 units CAPS capsule Take 1 capsule (50,000 Units total) by mouth every 7 (seven) days. 30 capsule  Past Week at Unknown time  . warfarin (COUMADIN) 5 MG tablet TAKE 1 TABLET BY MOUTH DAILY EXCEPT 1/2 TABLET ON WEDNESDAYS AND SATURDAYS. 30 tablet 6 10/13/2016 at 1700  . ALPRAZolam (XANAX) 0.25 MG tablet Take 0.25 mg by mouth 3 (three) times daily as needed.   Not Taking at Unknown time  . ferumoxytol 510 mg in sodium chloride 0.9 % 100 mL Inject 510 mg into the vein once a week. (Patient not taking: Reported on 10/29/2016)   Not Taking at Unknown time  . gabapentin (NEURONTIN) 100 MG capsule Take 100 mg by mouth at bedtime. Take 2 tabs at night   Not Taking at Unknown time   Assessment: 83yo female on chronic Coumadin PTA for h/o afib.  INR is remain subtherapeutic today  Goal of Therapy:  INR 2-3 Monitor platelets by anticoagulation protocol: Yes   Plan:  Repeat Coumadin  today x 1 CBC, INR daily Monitor for s/sx bleeding complications  [name] 10/21/2016,9:19 AM

## 2016-10-22 LAB — CBC
HCT: 35.6 % — ABNORMAL LOW (ref 36.0–46.0)
HEMOGLOBIN: 10.8 g/dL — AB (ref 12.0–15.0)
MCH: 31.3 pg (ref 26.0–34.0)
MCHC: 30.3 g/dL (ref 30.0–36.0)
MCV: 103.2 fL — ABNORMAL HIGH (ref 78.0–100.0)
Platelets: 144 10*3/uL — ABNORMAL LOW (ref 150–400)
RBC: 3.45 MIL/uL — ABNORMAL LOW (ref 3.87–5.11)
RDW: 15.6 % — ABNORMAL HIGH (ref 11.5–15.5)
WBC: 8.1 10*3/uL (ref 4.0–10.5)

## 2016-10-22 LAB — BASIC METABOLIC PANEL
Anion gap: 8 (ref 5–15)
BUN: 76 mg/dL — AB (ref 6–20)
CO2: 31 mmol/L (ref 22–32)
CREATININE: 1.86 mg/dL — AB (ref 0.44–1.00)
Calcium: 9.2 mg/dL (ref 8.9–10.3)
Chloride: 102 mmol/L (ref 101–111)
GFR calc Af Amer: 28 mL/min — ABNORMAL LOW (ref >60)
GFR calc non Af Amer: 24 mL/min — ABNORMAL LOW (ref >60)
Glucose, Bld: 107 mg/dL — ABNORMAL HIGH (ref 65–99)
Potassium: 4.8 mmol/L (ref 3.5–5.1)
SODIUM: 141 mmol/L (ref 135–145)

## 2016-10-22 LAB — PROTIME-INR
INR: 2.04
Prothrombin Time: 22.9 seconds — ABNORMAL HIGH (ref 11.4–15.2)

## 2016-10-22 MED ORDER — LORAZEPAM 2 MG/ML IJ SOLN
1.0000 mg | INTRAMUSCULAR | Status: DC
  Administered 2016-10-22 – 2016-10-23 (×9): 1 mg via INTRAVENOUS
  Filled 2016-10-22 (×8): qty 1

## 2016-10-22 MED ORDER — LORAZEPAM 2 MG/ML IJ SOLN
1.0000 mg | INTRAMUSCULAR | Status: DC | PRN
  Filled 2016-10-22: qty 1

## 2016-10-22 MED ORDER — ACETAMINOPHEN 650 MG RE SUPP
650.0000 mg | Freq: Four times a day (QID) | RECTAL | Status: DC | PRN
  Administered 2016-10-22: 650 mg via RECTAL
  Filled 2016-10-22: qty 1

## 2016-10-22 MED ORDER — MORPHINE SULFATE 25 MG/ML IV SOLN
1.0000 mg/h | INTRAVENOUS | Status: DC
  Administered 2016-10-22: 1 mg/h via INTRAVENOUS
  Filled 2016-10-22: qty 10

## 2016-10-22 NOTE — Progress Notes (Signed)
PT IS MUCH MORE COMFORTABLE AND RELAXED. NO CRYING OUT OR THRASHING. FAMILY REMAINS AT BEDSIDE.PT IS NOW ON COMFORT MEASURES.

## 2016-10-22 NOTE — Progress Notes (Signed)
Subjective: She has clearly deteriorated overnight. She is more uncomfortable. I discussed with her daughter and granddaughter at bedside and all agree it's time to transition to comfort care.  Objective: Vital signs in last 24 hours: Temp:  [97.8 F (36.6 C)-100.1 F (37.8 C)] 98.9 F (37.2 C) (09/15 0728) Pulse Rate:  [67-85] 85 (09/15 0830) Resp:  [0-31] 16 (09/15 0830) BP: (94-169)/(62-122) 100/78 (09/15 0800) SpO2:  [80 %-100 %] 98 % (09/15 0830) Weight:  [107.5 kg (236 lb 15.9 oz)] 107.5 kg (236 lb 15.9 oz) (09/15 0400) Weight change: -1.5 kg (-3 lb 4.9 oz) Last BM Date: 10/19/16  Intake/Output from previous day: 09/14 0701 - 09/15 0700 In: 88 [I.V.:88] Out: 1050 [Urine:1050]  PHYSICAL EXAM General appearance: Sleepy but clearly uncomfortable Resp: rales bilaterally Cardio: irregularly irregular rhythm GI: soft, non-tender; bowel sounds normal; no masses,  no organomegaly Extremities: She still has significant edema diffusely She has some writhing movements  Lab Results:  Results for orders placed or performed during the hospital encounter of 10/09/2016 (from the past 48 hour(s))  Glucose, capillary     Status: Abnormal   Collection Time: 10/20/16 11:03 AM  Result Value Ref Range   Glucose-Capillary 140 (H) 65 - 99 mg/dL  MRSA PCR Screening     Status: None   Collection Time: 10/20/16 12:35 PM  Result Value Ref Range   MRSA by PCR NEGATIVE NEGATIVE    Comment:        The GeneXpert MRSA Assay (FDA approved for NASAL specimens only), is one component of a comprehensive MRSA colonization surveillance program. It is not intended to diagnose MRSA infection nor to guide or monitor treatment for MRSA infections.   Glucose, capillary     Status: Abnormal   Collection Time: 10/20/16  4:22 PM  Result Value Ref Range   Glucose-Capillary 128 (H) 65 - 99 mg/dL  Glucose, capillary     Status: Abnormal   Collection Time: 10/20/16  9:48 PM  Result Value Ref Range   Glucose-Capillary 147 (H) 65 - 99 mg/dL  Protime-INR     Status: Abnormal   Collection Time: 10/21/16  5:38 AM  Result Value Ref Range   Prothrombin Time 21.3 (H) 11.4 - 15.2 seconds   INR 1.87   CBC     Status: Abnormal   Collection Time: 10/21/16  5:38 AM  Result Value Ref Range   WBC 8.1 4.0 - 10.5 K/uL   RBC 3.06 (L) 3.87 - 5.11 MIL/uL   Hemoglobin 9.8 (L) 12.0 - 15.0 g/dL   HCT 31.1 (L) 36.0 - 46.0 %   MCV 101.6 (H) 78.0 - 100.0 fL   MCH 32.0 26.0 - 34.0 pg   MCHC 31.5 30.0 - 36.0 g/dL   RDW 15.8 (H) 11.5 - 15.5 %   Platelets 124 (L) 150 - 400 K/uL  Basic metabolic panel     Status: Abnormal   Collection Time: 10/21/16  5:38 AM  Result Value Ref Range   Sodium 140 135 - 145 mmol/L   Potassium 4.7 3.5 - 5.1 mmol/L   Chloride 101 101 - 111 mmol/L   CO2 30 22 - 32 mmol/L   Glucose, Bld 134 (H) 65 - 99 mg/dL   BUN 70 (H) 6 - 20 mg/dL   Creatinine, Ser 1.90 (H) 0.44 - 1.00 mg/dL   Calcium 9.0 8.9 - 10.3 mg/dL   GFR calc non Af Amer 23 (L) >60 mL/min   GFR calc Af Amer 27 (L) >60  mL/min    Comment: (NOTE) The eGFR has been calculated using the CKD EPI equation. This calculation has not been validated in all clinical situations. eGFR's persistently <60 mL/min signify possible Chronic Kidney Disease.    Anion gap 9 5 - 15  Glucose, capillary     Status: Abnormal   Collection Time: 10/21/16  7:22 AM  Result Value Ref Range   Glucose-Capillary 119 (H) 65 - 99 mg/dL   Comment 1 Notify RN    Comment 2 Document in Chart   Glucose, capillary     Status: Abnormal   Collection Time: 10/21/16 11:40 AM  Result Value Ref Range   Glucose-Capillary 132 (H) 65 - 99 mg/dL   Comment 1 Document in Chart    Comment 2 Repeat Test   Glucose, capillary     Status: Abnormal   Collection Time: 10/21/16  4:06 PM  Result Value Ref Range   Glucose-Capillary 119 (H) 65 - 99 mg/dL   Comment 1 Notify RN    Comment 2 Document in Chart   Glucose, capillary     Status: None   Collection Time:  10/21/16  9:20 PM  Result Value Ref Range   Glucose-Capillary 98 65 - 99 mg/dL   Comment 1 Notify RN    Comment 2 Document in Chart   Protime-INR     Status: Abnormal   Collection Time: 10/22/16  4:32 AM  Result Value Ref Range   Prothrombin Time 22.9 (H) 11.4 - 15.2 seconds   INR 2.04   CBC     Status: Abnormal   Collection Time: 10/22/16  4:32 AM  Result Value Ref Range   WBC 8.1 4.0 - 10.5 K/uL   RBC 3.45 (L) 3.87 - 5.11 MIL/uL   Hemoglobin 10.8 (L) 12.0 - 15.0 g/dL   HCT 35.6 (L) 36.0 - 46.0 %   MCV 103.2 (H) 78.0 - 100.0 fL   MCH 31.3 26.0 - 34.0 pg   MCHC 30.3 30.0 - 36.0 g/dL   RDW 15.6 (H) 11.5 - 15.5 %   Platelets 144 (L) 150 - 400 K/uL  Basic metabolic panel     Status: Abnormal   Collection Time: 10/22/16  4:32 AM  Result Value Ref Range   Sodium 141 135 - 145 mmol/L   Potassium 4.8 3.5 - 5.1 mmol/L   Chloride 102 101 - 111 mmol/L   CO2 31 22 - 32 mmol/L   Glucose, Bld 107 (H) 65 - 99 mg/dL   BUN 76 (H) 6 - 20 mg/dL   Creatinine, Ser 1.86 (H) 0.44 - 1.00 mg/dL   Calcium 9.2 8.9 - 10.3 mg/dL   GFR calc non Af Amer 24 (L) >60 mL/min   GFR calc Af Amer 28 (L) >60 mL/min    Comment: (NOTE) The eGFR has been calculated using the CKD EPI equation. This calculation has not been validated in all clinical situations. eGFR's persistently <60 mL/min signify possible Chronic Kidney Disease.    Anion gap 8 5 - 15    ABGS No results for input(s): PHART, PO2ART, TCO2, HCO3 in the last 72 hours.  Invalid input(s): PCO2 CULTURES Recent Results (from the past 240 hour(s))  MRSA PCR Screening     Status: None   Collection Time: 10/20/16 12:35 PM  Result Value Ref Range Status   MRSA by PCR NEGATIVE NEGATIVE Final    Comment:        The GeneXpert MRSA Assay (FDA approved for NASAL specimens only), is  one component of a comprehensive MRSA colonization surveillance program. It is not intended to diagnose MRSA infection nor to guide or monitor treatment for MRSA  infections.    Studies/Results: Dg Chest 1 View  Result Date: 10/20/2016 CLINICAL DATA:  Shortness of breath, hypertension, atrial fibrillation EXAM: CHEST 1 VIEW COMPARISON:  Chest x-ray of 10/15/2016 FINDINGS: There is persistent and possibly worsened CHF with moderate cardiomegaly, edema, and probable bilateral effusions. Right central venous line tip overlies the mid SVC. No pneumothorax is seen. IMPRESSION: Little change to slight worsening of CHF with cardiomegaly and small effusions. Electronically Signed   By: Ivar Drape M.D.   On: 10/20/2016 12:02    Medications:  Prior to Admission:  Prescriptions Prior to Admission  Medication Sig Dispense Refill Last Dose  . acetaminophen (TYLENOL) 325 MG tablet Take 2 tablets (650 mg total) by mouth every 4 (four) hours as needed for mild pain, moderate pain, fever or headache.   unknown  . albuterol (PROVENTIL) (2.5 MG/3ML) 0.083% nebulizer solution Take 3 mLs (2.5 mg total) by nebulization every 6 (six) hours as needed for wheezing or shortness of breath. 75 mL 12 10/13/2016 at Unknown time  . amLODipine (NORVASC) 5 MG tablet Take 1 tablet (5 mg total) by mouth daily. 30 tablet 11 10/25/2016 at Unknown time  . atorvastatin (LIPITOR) 40 MG tablet Take 1 tablet (40 mg total) by mouth daily. 90 tablet 3 10/13/2016 at Unknown time  . bisacodyl (DULCOLAX) 10 MG suppository Place 1 suppository (10 mg total) rectally daily as needed for moderate constipation. 12 suppository 0   . bisacodyl (DULCOLAX) 5 MG EC tablet Take 1 tablet (5 mg total) by mouth daily as needed for moderate constipation. 30 tablet 0   . clopidogrel (PLAVIX) 75 MG tablet Take 1 tablet (75 mg total) by mouth daily. 90 tablet 3 10/13/2016 at 1600  . hydrALAZINE (APRESOLINE) 100 MG tablet Take 1 tablet (100 mg total) by mouth 3 (three) times daily. 270 tablet 3 11/03/2016 at Unknown time  . HYDROcodone-acetaminophen (NORCO/VICODIN) 5-325 MG tablet Take 1 tablet by mouth 4 (four) times daily as  needed for moderate pain. 30 tablet 0 10/13/2016 at Unknown time  . insulin aspart (NOVOLOG) 100 UNIT/ML injection Inject 0-15 Units into the skin 3 (three) times daily with meals. 10 mL 11   . insulin aspart (NOVOLOG) 100 UNIT/ML injection Inject 0-5 Units into the skin at bedtime. 10 mL 11 10/13/2016 at Unknown time  . insulin aspart (NOVOLOG) 100 UNIT/ML injection Inject 4 Units into the skin 3 (three) times daily with meals. 10 mL 11 10/30/2016 at Unknown time  . isosorbide mononitrate (IMDUR) 60 MG 24 hr tablet Take 1 tablet (60 mg total) by mouth daily. 90 tablet 3 10/21/2016 at Unknown time  . nitroGLYCERIN (NITROSTAT) 0.4 MG SL tablet Place 1 tablet (0.4 mg total) under the tongue every 5 (five) minutes as needed for chest pain. 25 tablet 2 unknown  . pantoprazole (PROTONIX) 40 MG tablet Take 1 tablet (40 mg total) by mouth daily at 6 (six) AM. 90 tablet 3 10/30/2016 at Unknown time  . primidone (MYSOLINE) 50 MG tablet Take 1 tablet (50 mg total) by mouth daily.   10/28/2016 at Unknown time  . PROAIR HFA 108 (90 Base) MCG/ACT inhaler Inhale 1-2 puffs into the lungs 4 (four) times daily.    10/24/2016 at Unknown time  . torsemide (DEMADEX) 20 MG tablet Take 1 tablet (20 mg total) by mouth daily.   10/10/2016 at  Unknown time  . TRAVATAN Z 0.004 % SOLN ophthalmic solution Place 1 drop into both eyes at bedtime.    10/13/2016 at Unknown time  . umeclidinium bromide (INCRUSE ELLIPTA) 62.5 MCG/INH AEPB Inhale 1 puff into the lungs daily.   10/13/2016 at Unknown time  . Vitamin D, Ergocalciferol, (DRISDOL) 50000 units CAPS capsule Take 1 capsule (50,000 Units total) by mouth every 7 (seven) days. 30 capsule  Past Week at Unknown time  . warfarin (COUMADIN) 5 MG tablet TAKE 1 TABLET BY MOUTH DAILY EXCEPT 1/2 TABLET ON WEDNESDAYS AND SATURDAYS. 30 tablet 6 10/13/2016 at 1700  . ALPRAZolam (XANAX) 0.25 MG tablet Take 0.25 mg by mouth 3 (three) times daily as needed.   Not Taking at Unknown time  . ferumoxytol 510 mg in  sodium chloride 0.9 % 100 mL Inject 510 mg into the vein once a week. (Patient not taking: Reported on 10/25/2016)   Not Taking at Unknown time  . gabapentin (NEURONTIN) 100 MG capsule Take 100 mg by mouth at bedtime. Take 2 tabs at night   Not Taking at Unknown time   Scheduled: . latanoprost  1 drop Both Eyes QHS  . LORazepam  1 mg Intravenous Q4H  . sodium chloride flush  10-40 mL Intracatheter Q12H  . sodium chloride flush  3 mL Intravenous Q12H   Continuous: . sodium chloride    . sodium chloride Stopped (10/15/16 1600)  . morphine 1 mg/hr (10/22/16 0935)   XPF:[id number] chloride, acetaminophen, acetaminophen, albuterol, LORazepam, ondansetron (ZOFRAN) IV, sodium chloride flush, sodium chloride flush  Assesment: She was admitted with acute on chronic diastolic heart failure. She has stage IV chronic kidney disease. She has persistent atrial fib. She has diabetes. She is clearly dying now. Principal Problem:   Acute on chronic diastolic CHF (congestive heart failure) (HCC) Active Problems:   Essential hypertension, benign   Persistent atrial fibrillation (HCC)   Chronic kidney disease (CKD), stage IV (severe) (HCC)   CAD S/P high risk PCI 12/01/15   Anemia of chronic disease   Diabetes mellitus type 2, controlled ([place])   Thrombocytopenia ([place])   Palliative care encounter   Goals of care, counseling/discussion   Acute on chronic renal failure ([place])    Plan: Transition to comfort care. Discontinue all medications except for comfort related meds. Stop blood sugar checks etc.    LOS: 8 days   Denim Kalmbach L 10/22/2016, 9:50 AM

## 2016-11-07 NOTE — Progress Notes (Signed)
She remains on comfort measures. She is receiving intravenous morphine and lorazepam. She is very comfortable. Her respirations are down to about 6. She is wearing nasal oxygen. Anticipate death within 24 hours. Transfer her out of stepdown to regular room.

## 2016-11-07 NOTE — Progress Notes (Signed)
Morphine wasted with Renea Bondurant.  The NS mixture with Morphine had approximately left in bag when discontinued.  The bag was discarded and contents wasted in the sink.

## 2016-11-07 NOTE — Progress Notes (Signed)
Patient passed at 35.  Two nurse verification, Dia Crawford and [employer].  MD and [facility] made aware.

## 2016-11-07 NOTE — Death Summary Note (Signed)
DEATH SUMMARY   Patient Details  Name: Melissa [name] MRN: [id number] DOB: 25-Mar-1930  Admission/Discharge Information   Admit Date:  November 13, 2016  Date of Death: Date of Death: 22-Nov-2016  Time of Death: Time of Death: 07-Jul-1998  Length of Stay: 16-Jun-2022  Referring Physician: Kari Baars, MD   Reason(s) for Hospitalization  Acute on chronic diastolic heart failure  Diagnoses  Preliminary cause of death:  Secondary Diagnoses (including complications and co-morbidities):  Principal Problem:   Acute on chronic diastolic CHF (congestive heart failure) (HCC) Active Problems:   Essential hypertension, benign   Persistent atrial fibrillation (HCC)   Chronic kidney disease (CKD), stage IV (severe) (HCC)   CAD S/P high risk PCI 12/01/15   Anemia of chronic disease   Diabetes mellitus type 2, controlled (HCC)   Thrombocytopenia (HCC)   Palliative care encounter   Goals of care, counseling/discussion   Acute on chronic renal failure [facility])   Brief Hospital Course (including significant findings, care, treatment, and services provided and events leading to death)  Melissa [name] is a 83 y.o. year old female who Has a history of multiple medical problems including chronic diastolic heart failure chronic renal failure stage IV malnutrition chronic atrial fib chronic anticoagulation failure to thrive. She came to the emergency department with increasing shortness of breath and swelling. She was treated with intravenous diuretics but did not improve. She had palliative care consultation. She did not want to be resuscitated. She did not want dialysis. She had worsening of her chest x-ray despite the IV diuresis and we tried continuous infusion of Lasix but he did not make any difference. She was clearly suffering and after discussion with her family it was elected to convert to full palliative/comfort care she was started on morphine continuous infusion and died about 36 hours later    Pertinent  Labs and Studies  Significant Diagnostic Studies Dg Chest 1 View  Result Date: 10/20/2016 CLINICAL DATA:  Shortness of breath, hypertension, atrial fibrillation EXAM: CHEST 1 VIEW COMPARISON:  Chest x-ray of 10/15/2016 FINDINGS: There is persistent and possibly worsened CHF with moderate cardiomegaly, edema, and probable bilateral effusions. Right central venous line tip overlies the mid SVC. No pneumothorax is seen. IMPRESSION: Little change to slight worsening of CHF with cardiomegaly and small effusions. Electronically Signed   By: Dwyane Dee M.D.   On: 10/20/2016 12:02   Dg Chest Port 1 View  Result Date: 10/15/2016 CLINICAL DATA:  PICC placement EXAM: PORTABLE CHEST 1 VIEW COMPARISON:  11-13-2016 FINDINGS: Right arm PICC tip in the proximal SVC. Cardiac enlargement. Vascular congestion and mild edema unchanged. Bibasilar airspace disease unchanged with small left effusion IMPRESSION: PICC tip in the proximal SVC. Continued heart failure with mild edema and bibasilar atelectasis. Electronically Signed   By: Marlan Palau M.D.   On: 10/15/2016 13:38   Dg Chest Port 1 View  Result Date: November 13, 2016 CLINICAL DATA:  Diffuse body edema, weight gain and shortness of breath for several days. EXAM: PORTABLE CHEST 1 VIEW COMPARISON:  08/22/2016. FINDINGS: Poor inspiration. Stable enlarged cardiac silhouette. No significant change and prominence of the pulmonary vasculature and interstitial markings taking the decreased inspiration into account. New linear densities in both mid lung zones. Aortic arch calcifications. No acute bony abnormality. IMPRESSION: 1. Poor inspiration with interval mild linear atelectasis in both mid lung zones. 2. Stable cardiomegaly, pulmonary vascular congestion and mild chronic interstitial lung disease. Electronically Signed   By: Beckie Salts M.D.   On: November 13, 2016 13:48  Microbiology Recent Results (from the past 240 hour(s))  MRSA PCR Screening     Status: None    Collection Time: 10/20/16 12:35 PM  Result Value Ref Range Status   MRSA by PCR NEGATIVE NEGATIVE Final    Comment:        The GeneXpert MRSA Assay (FDA approved for NASAL specimens only), is one component of a comprehensive MRSA colonization surveillance program. It is not intended to diagnose MRSA infection nor to guide or monitor treatment for MRSA infections.     Lab Basic Metabolic Panel:  Recent Labs Lab 10/18/16 0553 10/19/16 0654 10/20/16 0538 10/21/16 0538 10/22/16 0432  NA 142 141 141 140 141  K 4.1 4.4 4.3 4.7 4.8  CL 104 102 102 101 102  CO2 30 30 32 30 31  GLUCOSE 80 136* 137* 134* 107*  BUN 68* 68* 68* 70* 76*  CREATININE 1.75* 1.73* 1.83* 1.90* 1.86*  CALCIUM 9.0 9.0 8.9 9.0 9.2   Liver Function Tests:  Recent Labs Lab 10/17/16 1019  AST 31  ALT 17  ALKPHOS 74  BILITOT 0.9  PROT 6.4*  ALBUMIN 3.1*   No results for input(s): LIPASE, AMYLASE in the last 168 hours. No results for input(s): AMMONIA in the last 168 hours. CBC:  Recent Labs Lab 10/18/16 0553 10/19/16 0654 10/20/16 0538 10/21/16 0538 10/22/16 0432  WBC 7.2 7.8 6.6 8.1 8.1  HGB 9.8* 10.0* 9.4* 9.8* 10.8*  HCT 30.6* 31.1* 30.0* 31.1* 35.6*  MCV 100.7* 100.6* 101.4* 101.6* 103.2*  PLT 114* 108* 117* 124* 144*   Cardiac Enzymes: No results for input(s): CKTOTAL, CKMB, CKMBINDEX, TROPONINI in the last 168 hours. Sepsis Labs:  Recent Labs Lab 10/19/16 0654 10/20/16 0538 10/21/16 0538 10/22/16 0432  WBC 7.8 6.6 8.1 8.1    Procedures/Operations  None   Ryann Pauli L 10/24/2016, 7:52 AM

## 2016-11-07 DEATH — deceased

## 2018-12-18 IMAGING — DX DG HIP (WITH OR WITHOUT PELVIS) 2-3V*R*
3 series · 3 of 3 positions shown · non-contrast
Comparison: None.

CLINICAL DATA: Fall with pain in the right hip

EXAM:
DG HIP (WITH OR WITHOUT PELVIS) 2-3V RIGHT

[pelvis ap]
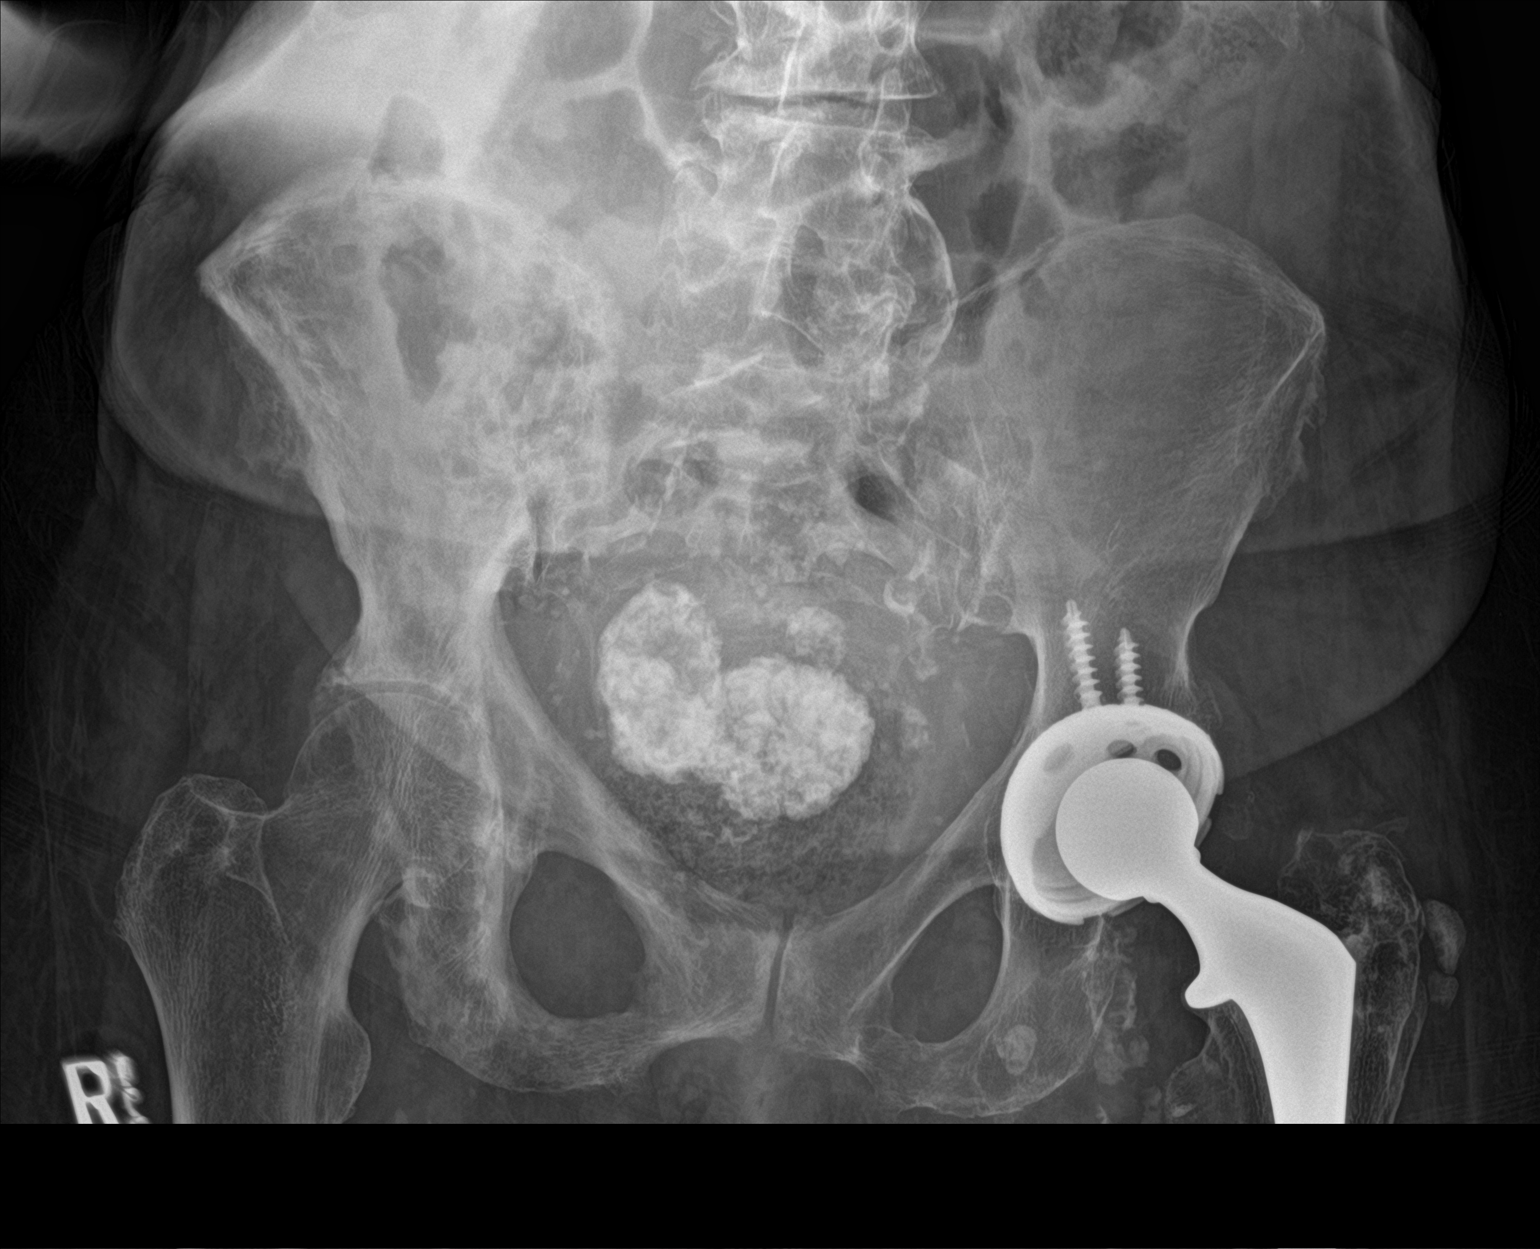

[hip ap]
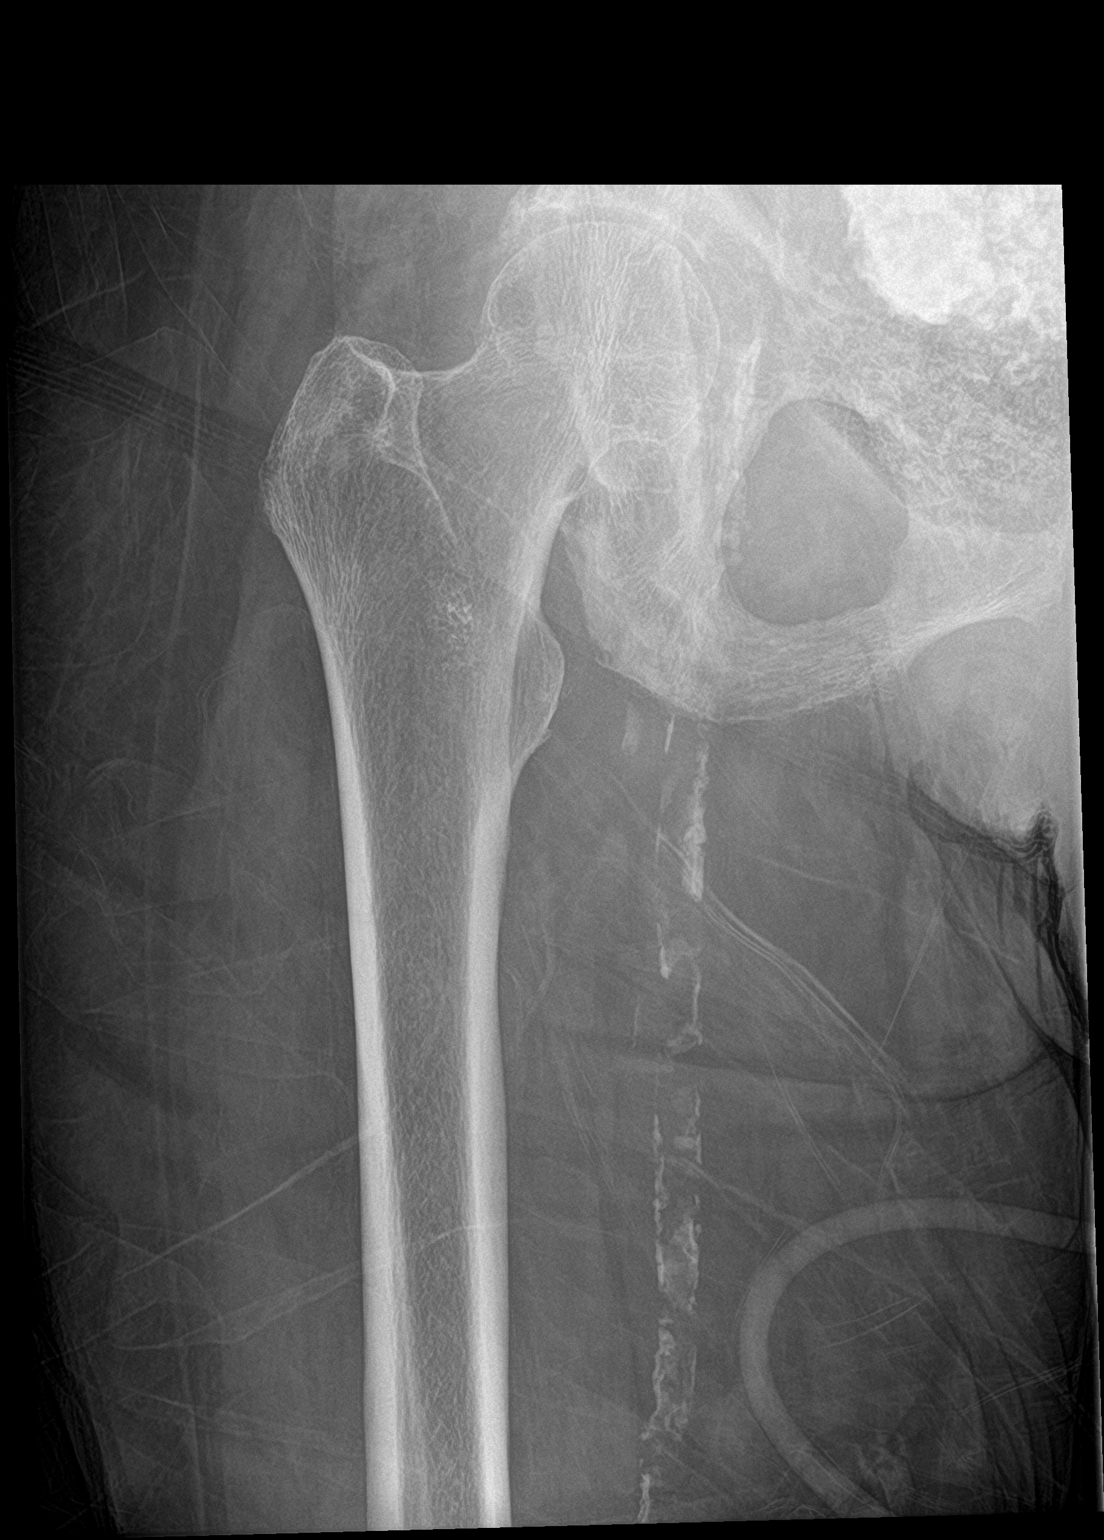

[hip lat]
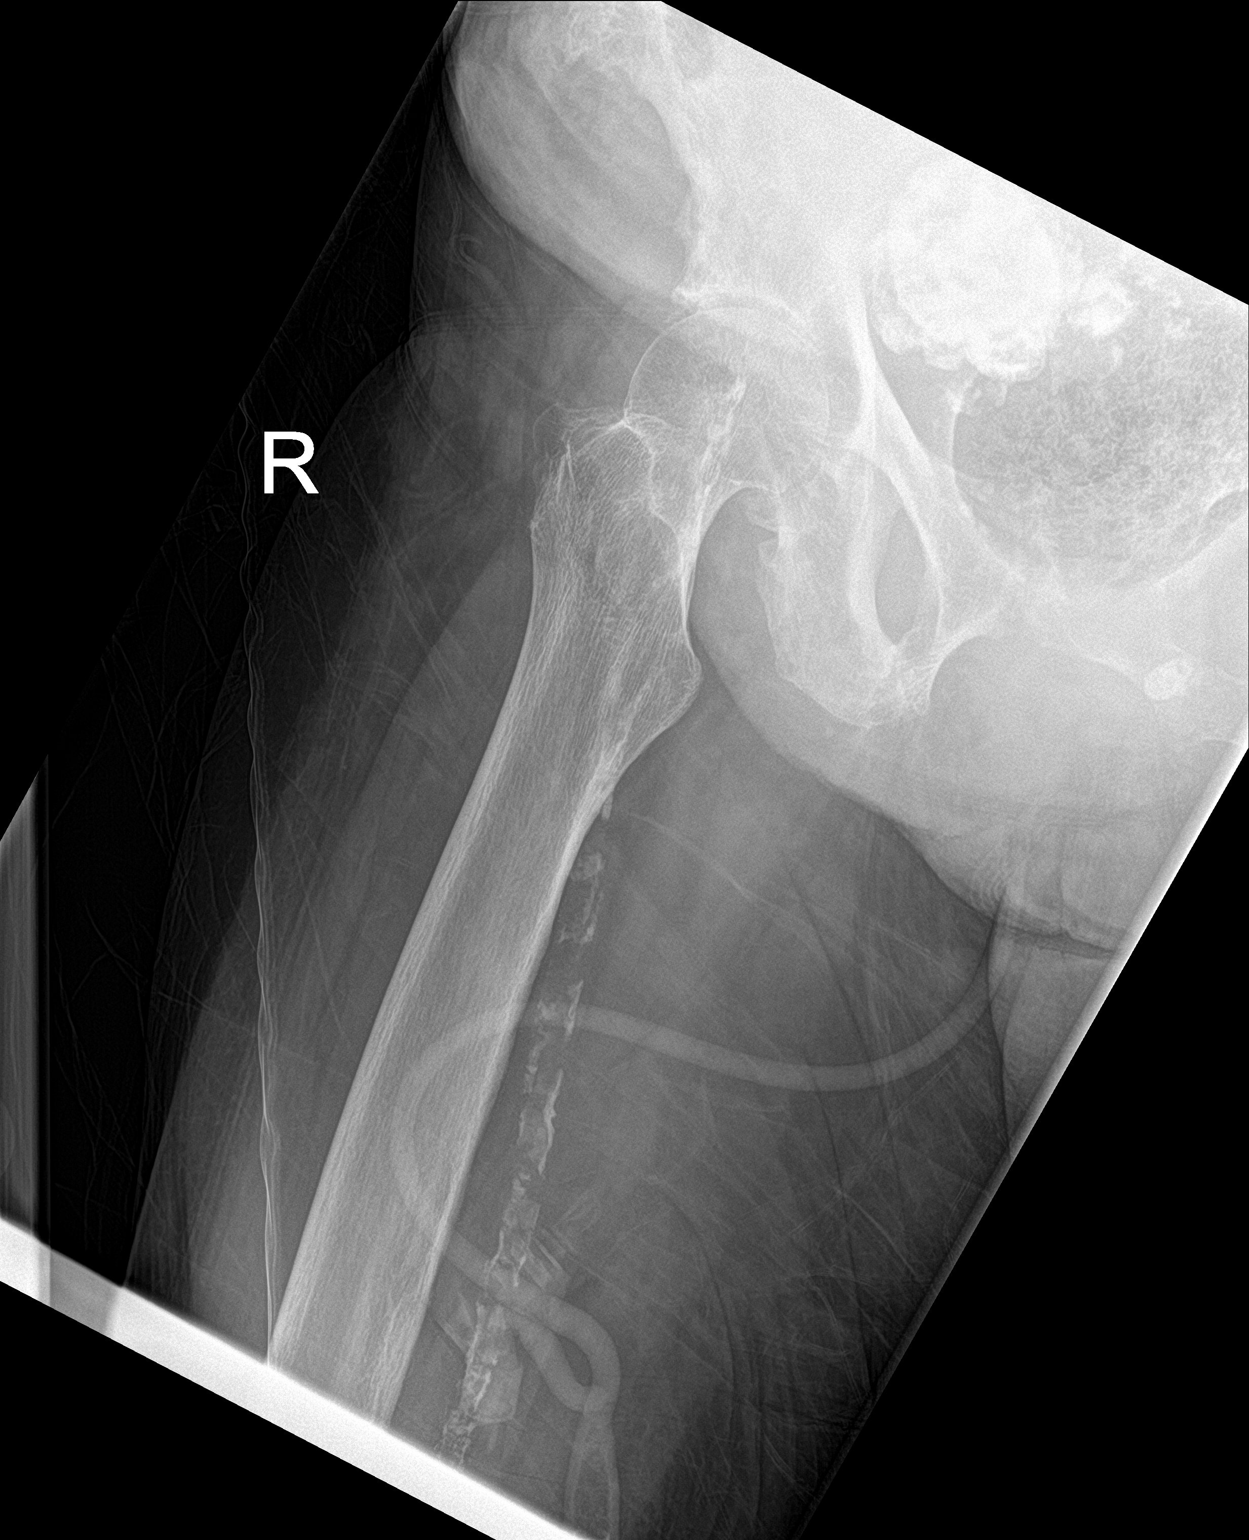

[3 of 3 positions shown; findings below may reference images not displayed]

FINDINGS: Status post left hip replacement with normal alignment. Pubic
symphysis is intact. Mild to moderate degenerative changes of the
right hip. No fracture or dislocation is seen.

Large calcified masses in the pelvis, likely fibroids. Vascular
calcification.
IMPRESSION: 1. No definite acute osseous abnormality. Mild to moderate
degenerative changes of the right hip
2. Status post left hip replacement with normal alignment
3. Large calcified pelvic masses are probably fibroids.

## 2018-12-18 IMAGING — DX DG HAND COMPLETE 3+V*R*
3 series · 3 of 3 positions shown · non-contrast
Comparison: None.

CLINICAL DATA: Fall this morning with right hand pain, initial
encounter

EXAM:
RIGHT HAND - COMPLETE 3+ VIEW

[hand pa]
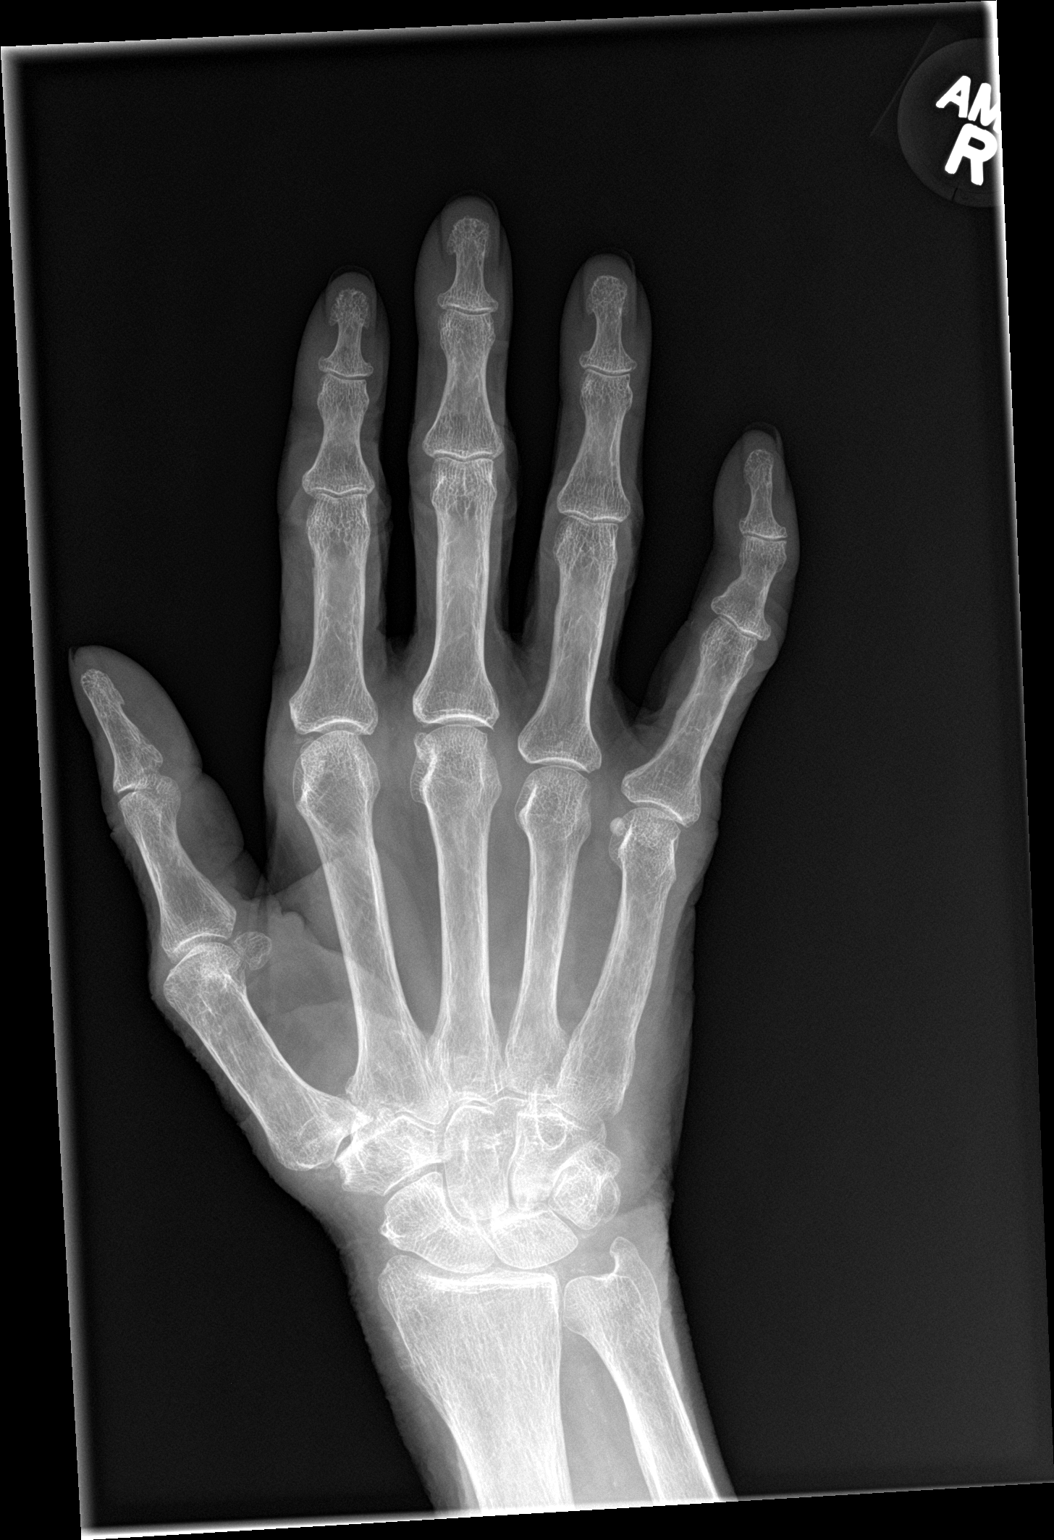

[hand obl]
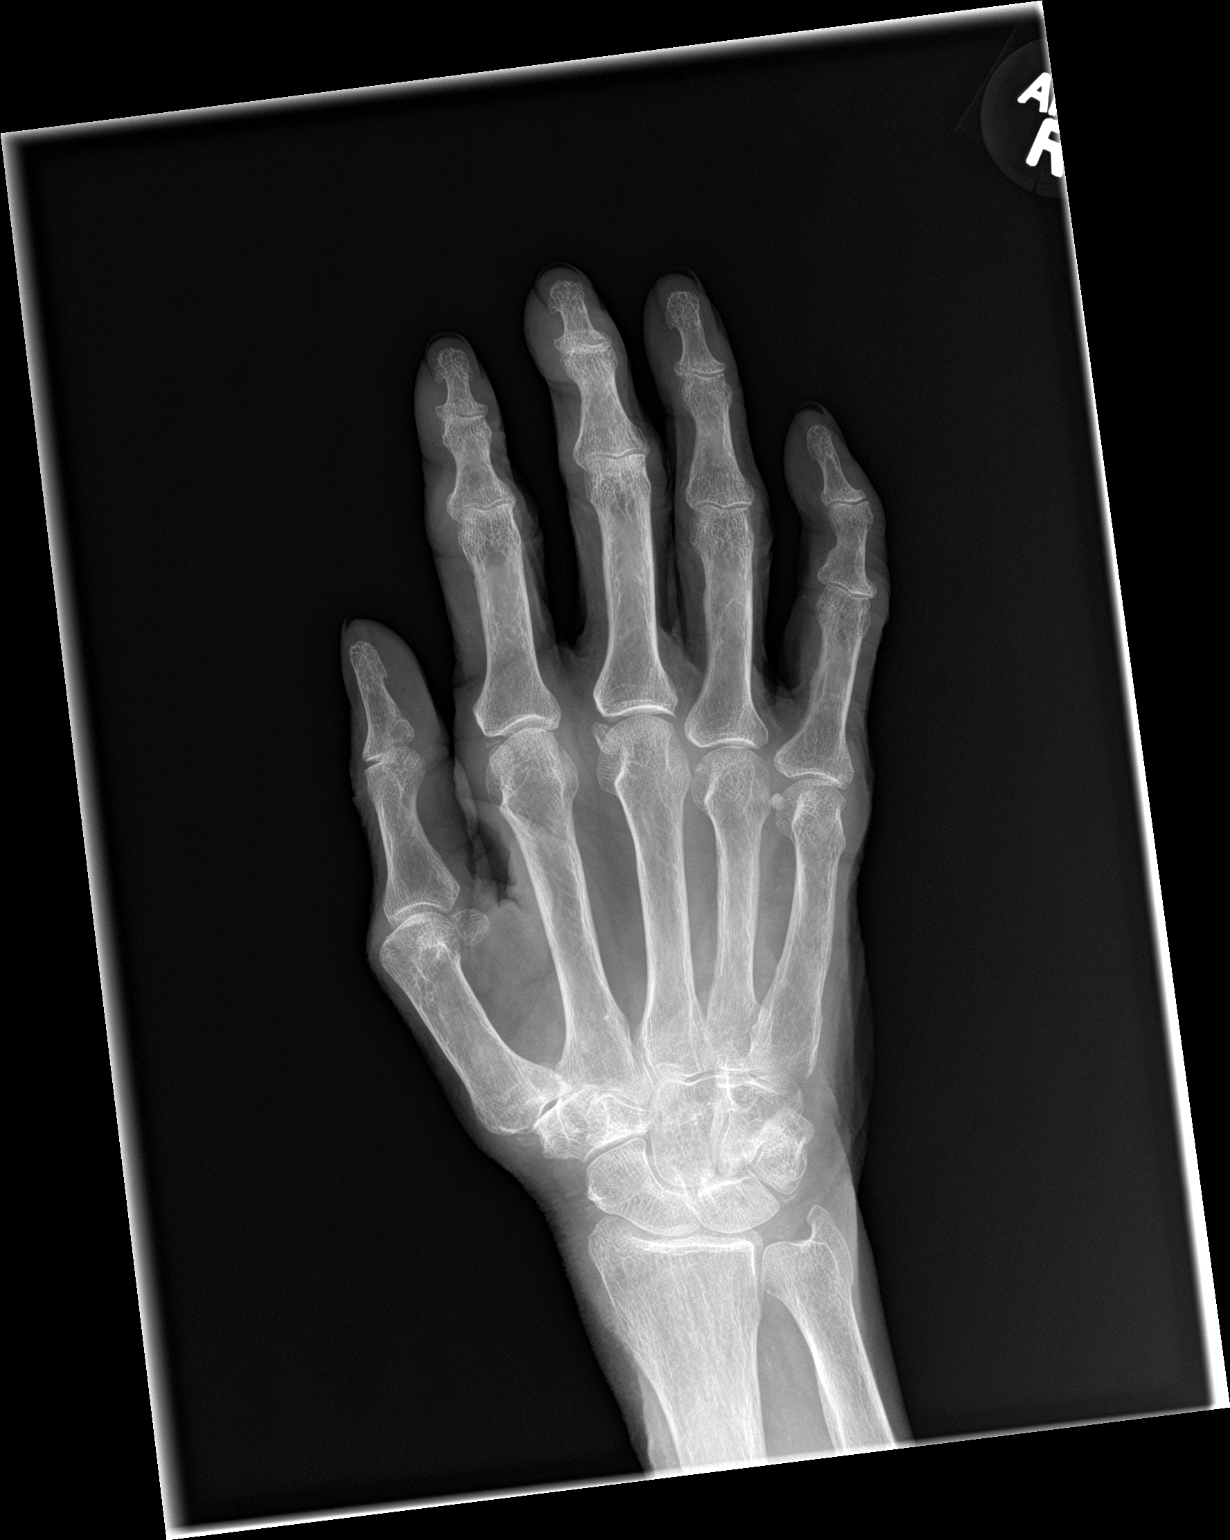

[hand lat]
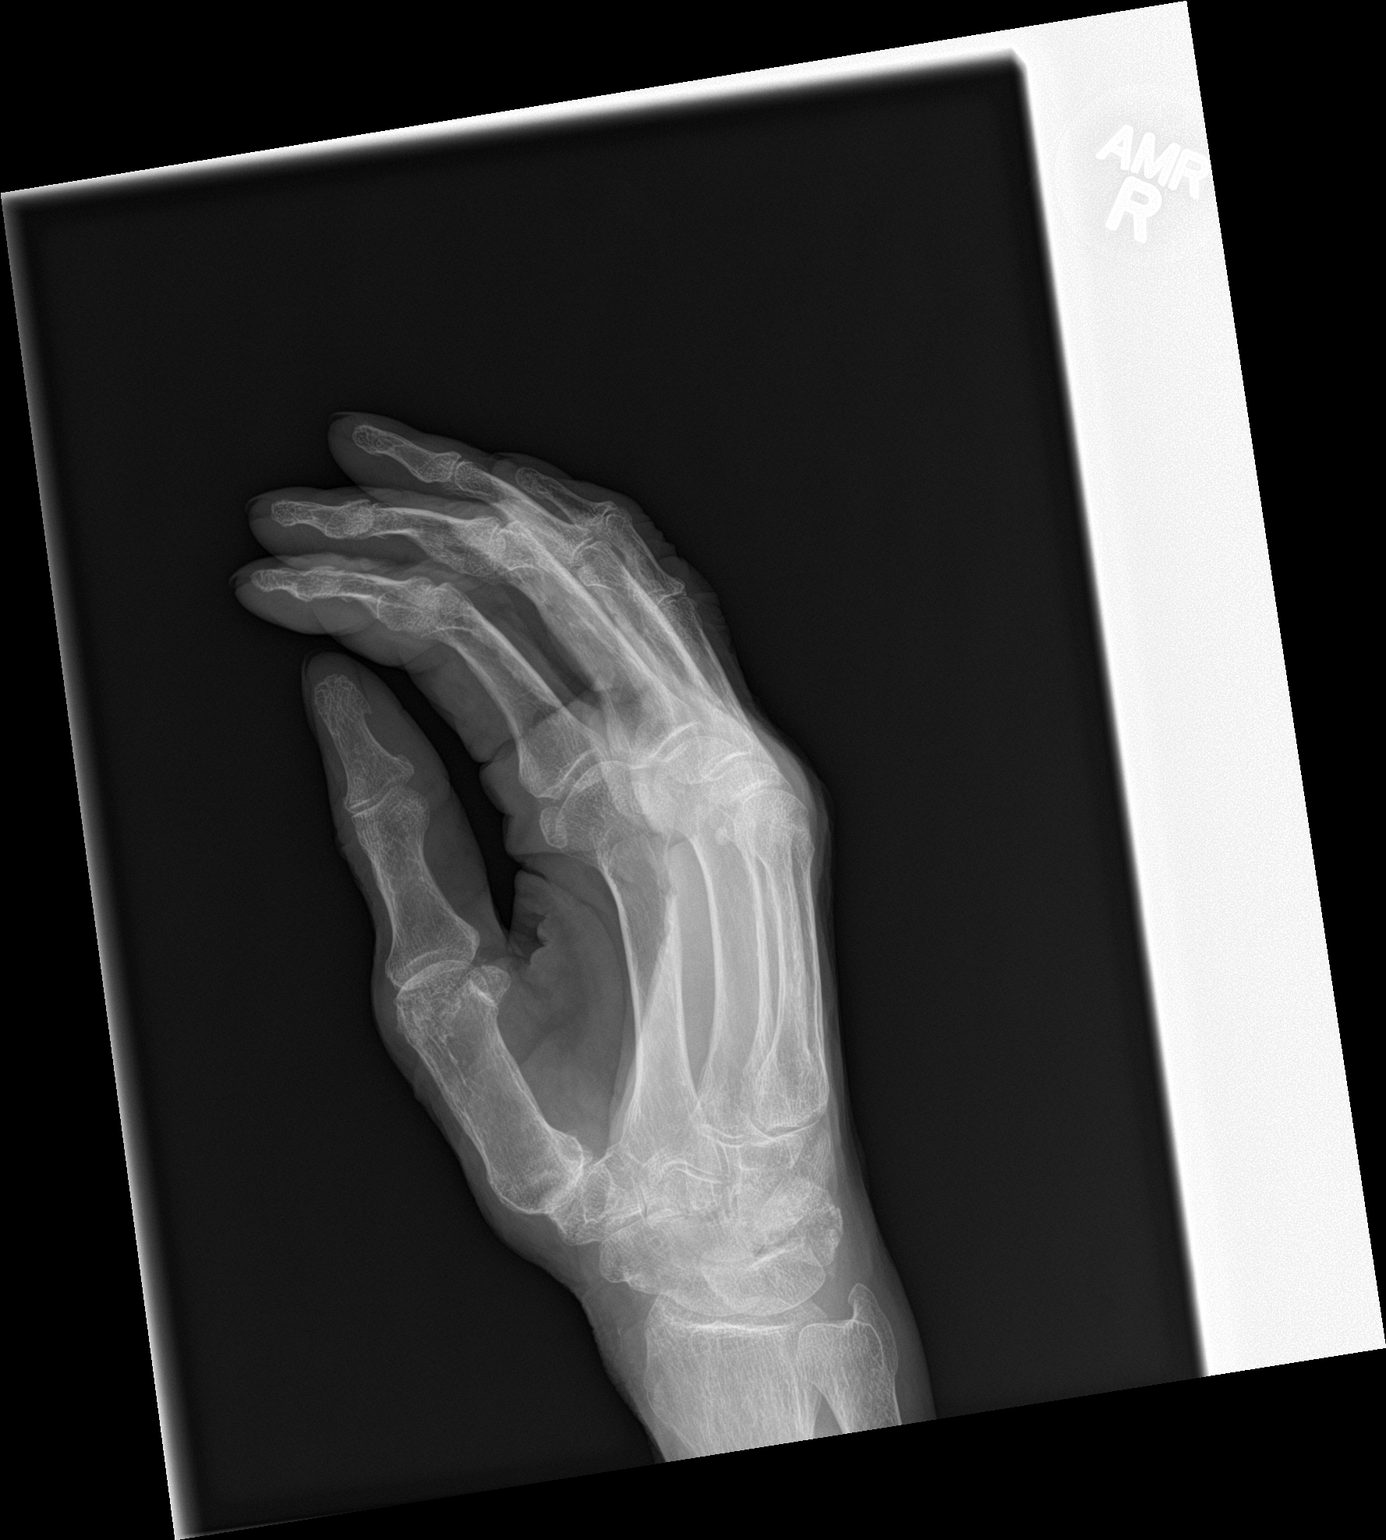

[3 of 3 positions shown; findings below may reference images not displayed]

FINDINGS: Some lucency is noted within the triquetrum on the lateral
projection. It would be difficult to exclude an undisplaced
fracture. No other fracture is seen. Mild degenerative changes in
the interphalangeal joints are noted.
IMPRESSION: Question triquetrum fracture

## 2018-12-22 IMAGING — CR DG FOOT 2V*R*
1 series · 2 of 2 positions shown · non-contrast
Comparison: None.

CLINICAL DATA: Pt c/o right foot pain overnight. Pt had fallen at
home prior to her admission but had not had pain in the foot
previously. She denies any history of gout.

EXAM:
RIGHT FOOT - 2 VIEW

[Series 2: ap · 0.17mm/px · 2 of 2 slices shown]
[im 1/2]
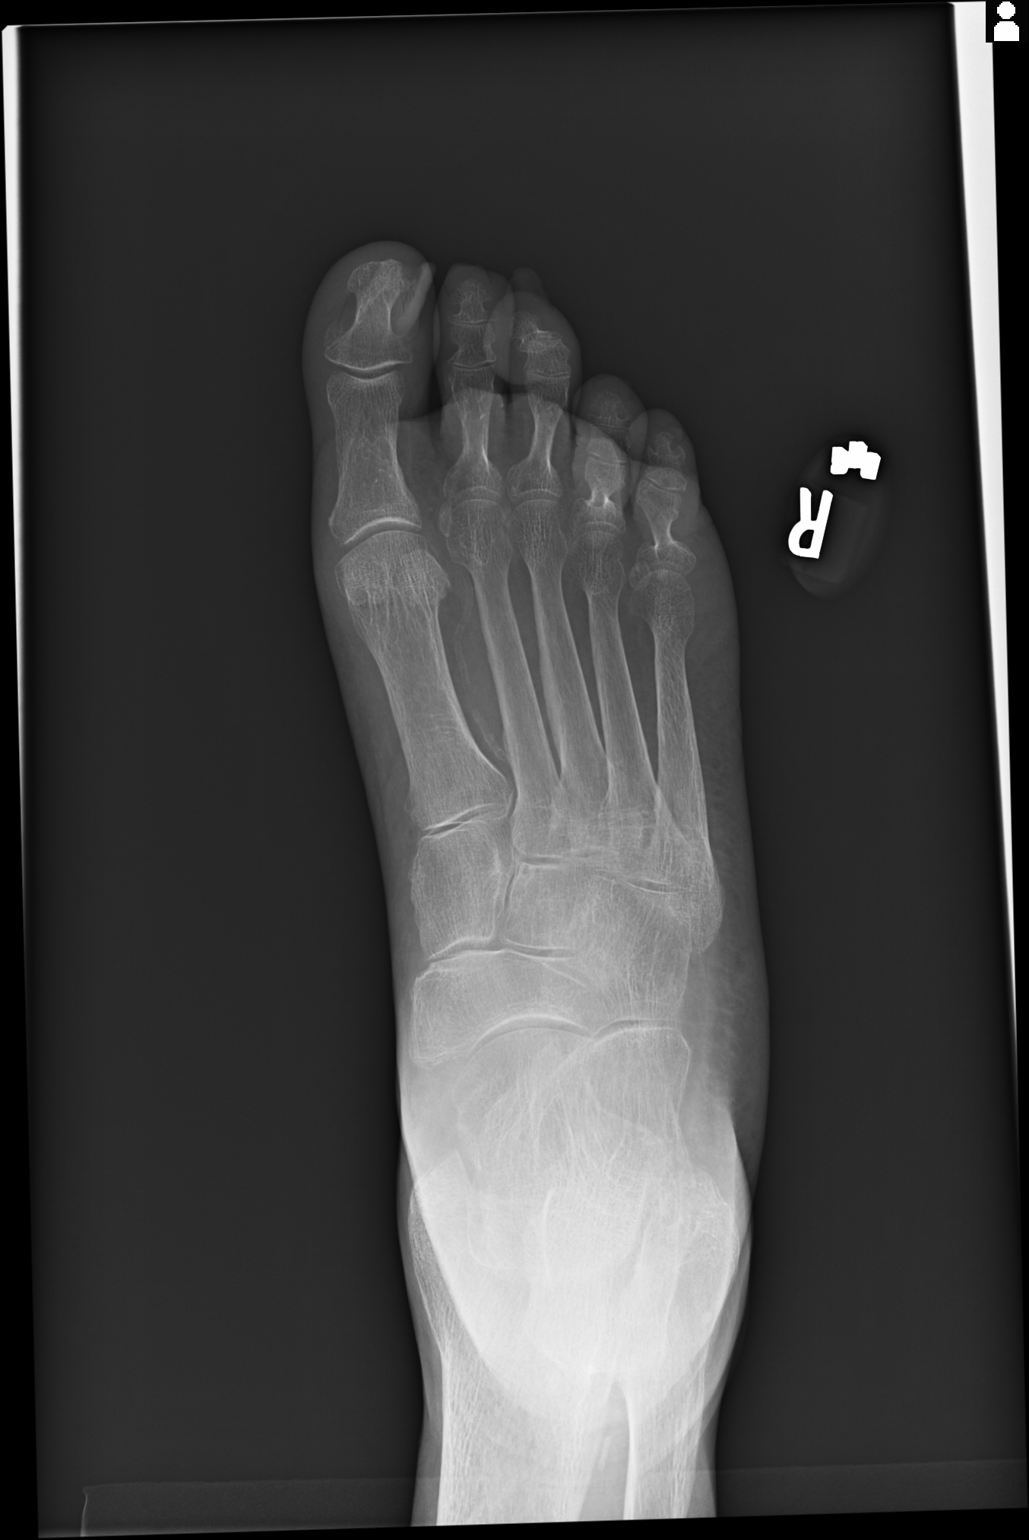
[im 2/2]
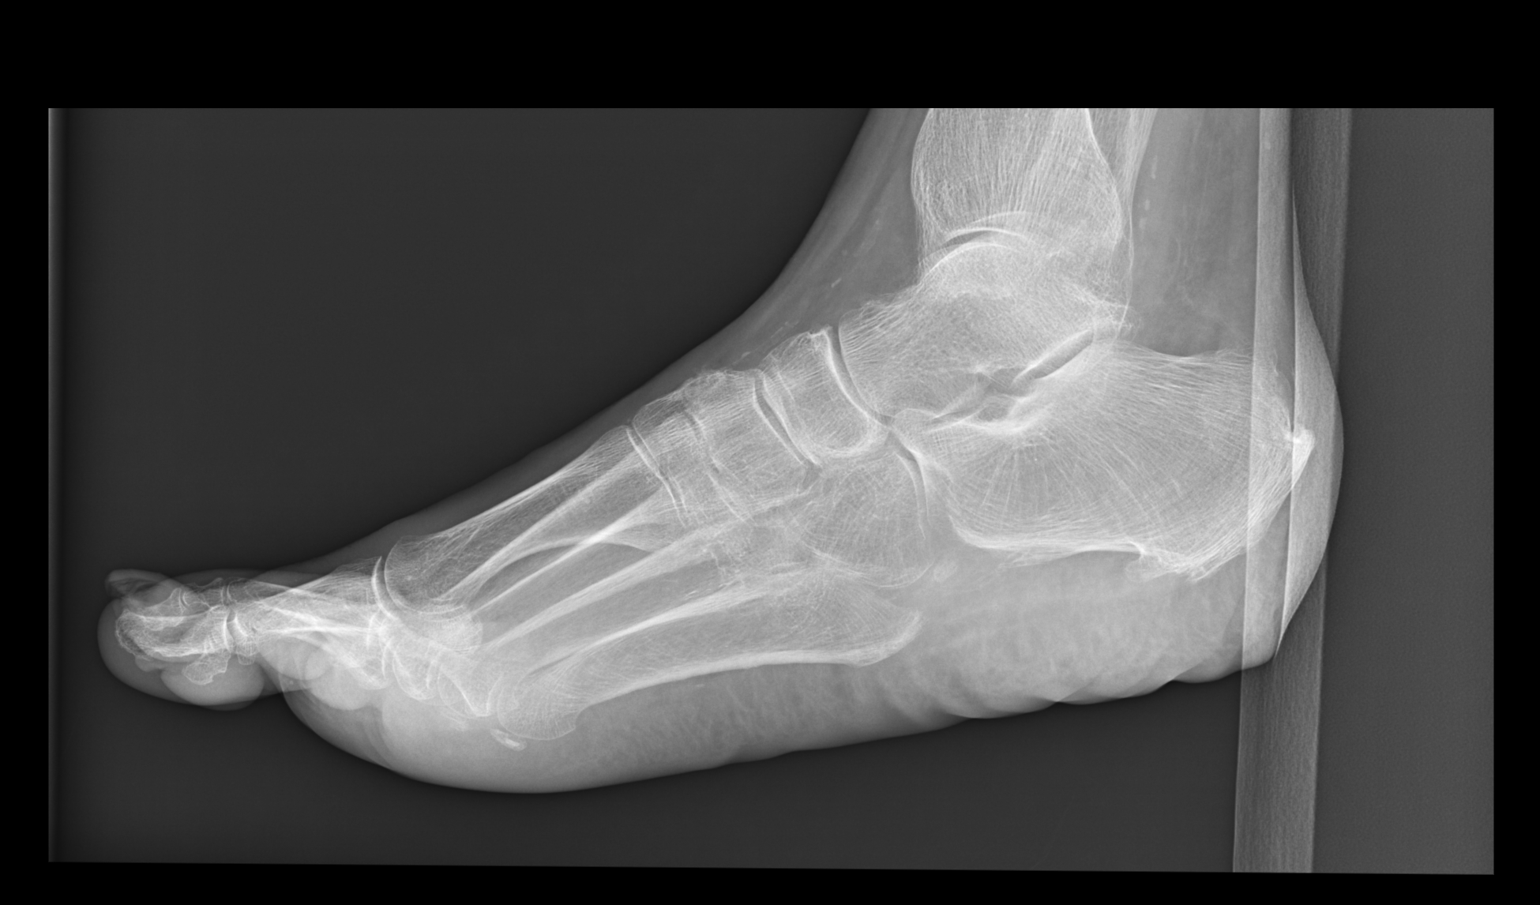

[2 of 2 positions shown; findings below may reference images not displayed]

FINDINGS: Bones appear osteopenic. There is no acute fracture or subluxation.
No radiopaque foreign body or soft tissue gas. A plantar calcaneal
spur is present. Small artery calcifications are present.
IMPRESSION: No evidence for acute  abnormality.
